# Patient Record
Sex: Female | Born: 1940 | Race: Black or African American | Hispanic: No | Smoking: Never smoker
Health system: Southern US, Community
[De-identification: ages and names within clinical notes are randomized; demographics above are authoritative.]

## PROBLEM LIST (undated history)

## (undated) DIAGNOSIS — M545 Low back pain, unspecified: Secondary | ICD-10-CM

## (undated) DIAGNOSIS — IMO0001 Reserved for inherently not codable concepts without codable children: Secondary | ICD-10-CM

## (undated) DIAGNOSIS — I10 Essential (primary) hypertension: Secondary | ICD-10-CM

## (undated) DIAGNOSIS — R519 Headache, unspecified: Secondary | ICD-10-CM

## (undated) DIAGNOSIS — I480 Paroxysmal atrial fibrillation: Secondary | ICD-10-CM

## (undated) DIAGNOSIS — M51369 Other intervertebral disc degeneration, lumbar region without mention of lumbar back pain or lower extremity pain: Secondary | ICD-10-CM

## (undated) DIAGNOSIS — F329 Major depressive disorder, single episode, unspecified: Secondary | ICD-10-CM

## (undated) DIAGNOSIS — E119 Type 2 diabetes mellitus without complications: Secondary | ICD-10-CM

## (undated) DIAGNOSIS — R200 Anesthesia of skin: Secondary | ICD-10-CM

## (undated) DIAGNOSIS — F419 Anxiety disorder, unspecified: Secondary | ICD-10-CM

## (undated) DIAGNOSIS — E785 Hyperlipidemia, unspecified: Secondary | ICD-10-CM

## (undated) DIAGNOSIS — M1990 Unspecified osteoarthritis, unspecified site: Secondary | ICD-10-CM

## (undated) DIAGNOSIS — I471 Supraventricular tachycardia, unspecified: Secondary | ICD-10-CM

## (undated) DIAGNOSIS — I4891 Unspecified atrial fibrillation: Secondary | ICD-10-CM

## (undated) DIAGNOSIS — F32A Depression, unspecified: Secondary | ICD-10-CM

## (undated) DIAGNOSIS — M5126 Other intervertebral disc displacement, lumbar region: Secondary | ICD-10-CM

## (undated) DIAGNOSIS — E079 Disorder of thyroid, unspecified: Secondary | ICD-10-CM

## (undated) DIAGNOSIS — I2510 Atherosclerotic heart disease of native coronary artery without angina pectoris: Secondary | ICD-10-CM

## (undated) DIAGNOSIS — Z531 Procedure and treatment not carried out because of patient's decision for reasons of belief and group pressure: Secondary | ICD-10-CM

## (undated) DIAGNOSIS — K219 Gastro-esophageal reflux disease without esophagitis: Secondary | ICD-10-CM

## (undated) DIAGNOSIS — M5136 Other intervertebral disc degeneration, lumbar region: Secondary | ICD-10-CM

## (undated) DIAGNOSIS — E559 Vitamin D deficiency, unspecified: Secondary | ICD-10-CM

## (undated) DIAGNOSIS — G4700 Insomnia, unspecified: Secondary | ICD-10-CM

## (undated) DIAGNOSIS — R51 Headache: Secondary | ICD-10-CM

## (undated) DIAGNOSIS — I219 Acute myocardial infarction, unspecified: Secondary | ICD-10-CM

## (undated) DIAGNOSIS — G8929 Other chronic pain: Secondary | ICD-10-CM

## (undated) HISTORY — DX: Unspecified atrial fibrillation: I48.91

## (undated) HISTORY — DX: Headache, unspecified: R51.9

## (undated) HISTORY — DX: Anesthesia of skin: R20.0

## (undated) HISTORY — PX: TUBAL LIGATION: SHX77

## (undated) HISTORY — DX: Paroxysmal atrial fibrillation: I48.0

## (undated) HISTORY — DX: Unspecified osteoarthritis, unspecified site: M19.90

## (undated) HISTORY — DX: Anxiety disorder, unspecified: F41.9

## (undated) HISTORY — DX: Essential (primary) hypertension: I10

## (undated) HISTORY — DX: Vitamin D deficiency, unspecified: E55.9

## (undated) HISTORY — DX: Gastro-esophageal reflux disease without esophagitis: K21.9

## (undated) HISTORY — DX: Acute myocardial infarction, unspecified: I21.9

## (undated) HISTORY — DX: Insomnia, unspecified: G47.00

## (undated) HISTORY — DX: Atherosclerotic heart disease of native coronary artery without angina pectoris: I25.10

## (undated) HISTORY — PX: DILATION AND CURETTAGE OF UTERUS: SHX78

## (undated) HISTORY — DX: Hyperlipidemia, unspecified: E78.5

## (undated) HISTORY — DX: Disorder of thyroid, unspecified: E07.9

## (undated) HISTORY — DX: Type 2 diabetes mellitus without complications: E11.9

## (undated) HISTORY — DX: Headache: R51

## (undated) HISTORY — PX: CATARACT EXTRACTION: SUR2

---

## 2004-11-08 ENCOUNTER — Ambulatory Visit (HOSPITAL_COMMUNITY): Admission: RE | Admit: 2004-11-08 | Discharge: 2004-11-08 | Payer: Self-pay | Admitting: Ophthalmology

## 2007-02-21 HISTORY — PX: CORONARY ANGIOPLASTY WITH STENT PLACEMENT: SHX49

## 2007-11-26 ENCOUNTER — Ambulatory Visit: Payer: Self-pay | Admitting: Vascular Surgery

## 2007-11-26 ENCOUNTER — Ambulatory Visit (HOSPITAL_COMMUNITY)
Admission: RE | Admit: 2007-11-26 | Discharge: 2007-11-26 | Payer: Self-pay | Admitting: Physical Medicine and Rehabilitation

## 2007-11-26 ENCOUNTER — Encounter (INDEPENDENT_AMBULATORY_CARE_PROVIDER_SITE_OTHER): Payer: Self-pay | Admitting: Physical Medicine and Rehabilitation

## 2007-11-26 ENCOUNTER — Encounter: Payer: Self-pay | Admitting: Cardiology

## 2008-02-19 ENCOUNTER — Ambulatory Visit: Payer: Self-pay | Admitting: Cardiology

## 2008-02-19 ENCOUNTER — Encounter: Payer: Self-pay | Admitting: Cardiology

## 2008-02-19 DIAGNOSIS — M545 Low back pain: Secondary | ICD-10-CM

## 2008-02-19 DIAGNOSIS — M549 Dorsalgia, unspecified: Secondary | ICD-10-CM | POA: Insufficient documentation

## 2008-02-19 DIAGNOSIS — R0789 Other chest pain: Secondary | ICD-10-CM | POA: Insufficient documentation

## 2008-02-19 DIAGNOSIS — R519 Headache, unspecified: Secondary | ICD-10-CM | POA: Insufficient documentation

## 2008-02-19 DIAGNOSIS — H402290 Chronic angle-closure glaucoma, unspecified eye, stage unspecified: Secondary | ICD-10-CM | POA: Insufficient documentation

## 2008-02-19 DIAGNOSIS — R51 Headache: Secondary | ICD-10-CM | POA: Insufficient documentation

## 2008-02-20 ENCOUNTER — Inpatient Hospital Stay (HOSPITAL_COMMUNITY): Admission: EM | Admit: 2008-02-20 | Discharge: 2008-02-25 | Payer: Self-pay | Admitting: Emergency Medicine

## 2008-02-20 ENCOUNTER — Ambulatory Visit: Payer: Self-pay | Admitting: Cardiology

## 2008-02-20 ENCOUNTER — Encounter: Payer: Self-pay | Admitting: Cardiology

## 2008-02-24 ENCOUNTER — Encounter: Payer: Self-pay | Admitting: Cardiology

## 2008-02-25 ENCOUNTER — Encounter: Payer: Self-pay | Admitting: Cardiology

## 2008-03-12 ENCOUNTER — Ambulatory Visit: Payer: Self-pay | Admitting: Cardiology

## 2008-04-09 ENCOUNTER — Encounter: Payer: Self-pay | Admitting: Cardiology

## 2008-09-25 ENCOUNTER — Ambulatory Visit: Payer: Self-pay | Admitting: Cardiology

## 2008-09-25 ENCOUNTER — Encounter: Payer: Self-pay | Admitting: Cardiology

## 2008-09-25 DIAGNOSIS — I2510 Atherosclerotic heart disease of native coronary artery without angina pectoris: Secondary | ICD-10-CM | POA: Insufficient documentation

## 2008-09-25 DIAGNOSIS — I25119 Atherosclerotic heart disease of native coronary artery with unspecified angina pectoris: Secondary | ICD-10-CM | POA: Insufficient documentation

## 2009-01-18 ENCOUNTER — Encounter: Payer: Self-pay | Admitting: Cardiology

## 2009-04-12 ENCOUNTER — Ambulatory Visit (HOSPITAL_COMMUNITY): Admission: RE | Admit: 2009-04-12 | Discharge: 2009-04-12 | Payer: Self-pay | Admitting: Ophthalmology

## 2009-10-05 ENCOUNTER — Telehealth (INDEPENDENT_AMBULATORY_CARE_PROVIDER_SITE_OTHER): Payer: Self-pay | Admitting: *Deleted

## 2009-10-08 ENCOUNTER — Telehealth (INDEPENDENT_AMBULATORY_CARE_PROVIDER_SITE_OTHER): Payer: Self-pay | Admitting: *Deleted

## 2009-11-12 ENCOUNTER — Ambulatory Visit: Payer: Self-pay | Admitting: Cardiology

## 2009-11-12 DIAGNOSIS — R0602 Shortness of breath: Secondary | ICD-10-CM | POA: Insufficient documentation

## 2009-11-12 DIAGNOSIS — I10 Essential (primary) hypertension: Secondary | ICD-10-CM | POA: Insufficient documentation

## 2009-11-12 DIAGNOSIS — E1169 Type 2 diabetes mellitus with other specified complication: Secondary | ICD-10-CM | POA: Insufficient documentation

## 2009-11-12 DIAGNOSIS — I152 Hypertension secondary to endocrine disorders: Secondary | ICD-10-CM | POA: Insufficient documentation

## 2009-11-12 DIAGNOSIS — E782 Mixed hyperlipidemia: Secondary | ICD-10-CM | POA: Insufficient documentation

## 2009-11-23 ENCOUNTER — Encounter: Payer: Self-pay | Admitting: Cardiology

## 2010-03-22 NOTE — Progress Notes (Signed)
Summary: hold plavix  Phone Note Other Incoming Call back at [phone]   Caller: Cindy with Dr. Tonia Brooms  Summary of Call: needs to have tooth extracted and wants to know how long to be off Plavix prior to extraction  [facility], LPN  October 05, 2009 10:11 AM   Follow-up for Phone Call        7 days, rsume ASAP and continue 81mg  by mouth once daily ASA Follow-up by: Lewayne Bunting, MD, [facility],  October 10, 2009 10:42 AM  Additional Follow-up for Phone Call Additional follow up Details #1::        Patient notified.    Additional Follow-up by: Hoover Brunette, LPN,  October 11, 2009 11:26 AM

## 2010-03-22 NOTE — Cardiovascular Report (Signed)
Summary: Cardiac Catheterization  Cardiac Catheterization   Imported By: Dorise Hiss 11/11/2009 14:22:39  _____________________________________________________________________  External Attachment:    Type:   Image     Comment:   External Document

## 2010-03-22 NOTE — Assessment & Plan Note (Signed)
Summary: 1 yr fu per aug reminder   Visit Type:  Follow-up Primary Provider:  Dr. Charm Barges  CC:  c/o SOB.  History of Present Illness: the patient is a 70 year old female with a history of prior stent placement with a Xience V mid LAD stent in January 2010. The patient has been doing well. She reports no chest pain shortness of breath orthopnea PND. She does remain on aspirin and Plavix. She denies any palpitations or syncope. Probably like her heart exam is within normal limits.  Preventive Screening-Counseling & Management  Alcohol-Tobacco     Smoking Status: never  Current Medications (verified): 1)  Triamterene-Hctz 37.5-25 Mg Tabs (Triamterene-Hctz) .... 1/2 Tab Daily 2)  Fish Oil 1000 Mg Caps (Omega-3 Fatty Acids) .... Take 1 Tablet By Mouth Two Times A Day 3)  Vitamin D 400 Unit  Tabs (Cholecalciferol) .... Take 1 Tablet By Mouth Once A Day 4)  Vitamin C 500 Mg Tabs (Ascorbic Acid) .... Take 1 Tablet By Mouth Once A Day 5)  Aspirin Ec 325 Mg Tbec (Aspirin) .... Take One Tablet By Mouth Daily 6)  Plavix 75 Mg Tabs (Clopidogrel Bisulfate) .... Take One Tablet By Mouth Daily 7)  Simvastatin 40 Mg Tabs (Simvastatin) .... Take One Tablet By Mouth Daily At Bedtime 8)  Metoprolol Tartrate 25 Mg Tabs (Metoprolol Tartrate) .... Take One Tablet By Mouth Twice A Day 9)  Ambien 10 Mg Tabs (Zolpidem Tartrate) .... As Needed 10)  Nitroglycerin 0.4 Mg Subl (Nitroglycerin) .... One Tablet Under Tongue Every 5 Minutes As Needed For Chest Pain---May Repeat Times Three 11)  Hydrocodone-Acetaminophen 5-325 Mg Tabs (Hydrocodone-Acetaminophen) .... Take One By Mouth Every Six Hours As Needed  Allergies (verified): No Known Drug Allergies  Comments:  Nurse/Medical Assistant: The patient's medication list and allergies were reviewed with the patient and were updated in the Medication and Allergy Lists.  Past History:  Past Medical History: Last updated: 02/19/2008 arthritis Glaucoma Herniated  disc followed by Dr. Abigail Miyamoto in [place]. Next Sinus problems Hypertension Dyslipidemia Borderline diabetes mellitus but not on any medications.  Family History: Last updated: 02/19/2008 mother had a small heart attack but she states 47 Father died from cancer The patient has 6 sisters 2 of them have coronary artery disease. Both however smoke. The patient has 5 brothers one died from a myocardial infarction but he also was a smoker. There is a history of prostate cancer and bleeding ulcers. The patient reports that she has a first cousin who had a brain aneurysm.  Social History: Last updated: 02/19/2008 the patient does not smoke or drink. She denies any drug use.  Risk Factors: Smoking Status: never (11/12/2009)  Review of Systems  The patient denies fatigue, malaise, fever, weight gain/loss, vision loss, decreased hearing, hoarseness, chest pain, palpitations, shortness of breath, prolonged cough, wheezing, sleep apnea, coughing up blood, abdominal pain, blood in stool, nausea, vomiting, diarrhea, heartburn, incontinence, blood in urine, muscle weakness, joint pain, leg swelling, rash, skin lesions, headache, fainting, dizziness, depression, anxiety, enlarged lymph nodes, easy bruising or bleeding, and environmental allergies.    Vital Signs:  Patient profile:   70 year old female Height:      66 inches Weight:      187 pounds BMI:     30.29 O2 Sat:      98 % on Room air Pulse rate:   76 / minute BP sitting:   122 / 78  (left arm) Cuff size:   regular  Vitals Entered By: Isabelle Course  Anderson (November 12, 2009 12:56 PM)  Nutrition Counseling: Patient's BMI is greater than 25 and therefore counseled on weight management options.  O2 Flow:  Room air CC: c/o SOB   Physical Exam  Additional Exam:  General: Well-developed, well-nourished in no distress head: Normocephalic and atraumatic eyes PERRLA/EOMI intact, conjunctiva and lids normal nose: No deformity or  lesions mouth normal dentition, normal posterior pharynx neck: Supple, no JVD.  No masses, thyromegaly or abnormal cervical nodes lungs: Normal breath sounds bilaterally without wheezing.  Normal percussion heart: regular rate and rhythm with normal S1 and S2, no S3 or S4.  PMI is normal.  No pathological murmurs abdomen: Normal bowel sounds, abdomen is soft and nontender without masses, organomegaly or hernias noted.  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 Shows no recurrent chest pain. We will continue dual antiplatelet therapy. Her updated medication list for this problem includes:    Aspirin Ec 325 Mg Tbec (Aspirin) .Marland Kitchen... Take one tablet by mouth daily    Plavix 75 Mg Tabs (Clopidogrel bisulfate) .Marland Kitchen... Take one tablet by mouth daily    Metoprolol Tartrate 25 Mg Tabs (Metoprolol tartrate) .Marland Kitchen... Take one tablet by mouth twice a day    Nitroglycerin 0.4 Mg Subl (Nitroglycerin) ..... One tablet under tongue every 5 minutes as needed for chest pain---may repeat times three  Problem # 2:  LOW BACK PAIN, CHRONIC (ICD-724.2) Assessment: Comment Only  Problem # 3:  ESSENTIAL HYPERTENSION (ICD-401.9) blood pressure controlled continue current medical regimen Her updated medication list for this problem includes:    Triamterene-hctz 37.5-25 Mg Tabs (Triamterene-hctz) .Marland Kitchen... 1/2 tab daily    Aspirin Ec 325 Mg Tbec (Aspirin) .Marland Kitchen... Take one tablet by mouth daily    Metoprolol Tartrate 25 Mg Tabs (Metoprolol tartrate) .Marland Kitchen... Take one tablet by mouth twice a day  Problem #  4:  HYPERLIPIDEMIA (ICD-272.4) followup outpatient primary care physician. Continue statin drug therapy. Her updated medication list for this problem includes:    Simvastatin 40 Mg Tabs (Simvastatin) .Marland Kitchen... Take one tablet by mouth daily at bedtime  Other Orders: EKG w/ Interpretation (93000) Pulmonary Function Test (PFT)  Patient Instructions: 1)  Scheduled PFT's  2)  Follow up in  6 months

## 2010-03-22 NOTE — Progress Notes (Signed)
Summary: Patient needing to know when to restart plavix  Phone Note Call from Patient Call back at Home Phone [phone]   Caller: Patient Reason for Call: Talk to Nurse Details for Reason: tooth pulled Summary of Call: Patient called to say that she could not wait any longer for a call back in reference to coming off plavix to have her  tooth extracted.  She took herself off so that she could have her tooth extracted.  She would like to know when she should start back on her plavix Initial call taken by: Claudette Laws,  October 08, 2009 8:51 AM  Follow-up for Phone Call        Patient stated she had dentist appointment on Monday to have tooth extracted.  Stated that she did not take her Plavix that morning because she had not eat anything and was going to take meds when she got back home.  Patient said that she waited there over an hour for an answer from [place] about her Plavix.  Informed pt that I did not receive a call from them on Monday, but I did receive a call on 8/16 from Dr. Carollee Massed office questioning how many days pt should come off Plavix.  Patient states that she continued to hold Plavix because she thought that the dentist office would have her come back this week.  Also, advised dentist office that the message had been sent to MD and when I get reply, I will call them back.  Advised pt to continue taking her Plavix for now and will call her back when get response from MD.  Patient verbalized understanding.  Follow-up by: Hoover Brunette, LPN,  October 08, 2009 9:22 AM  Additional Follow-up for Phone Call Additional follow up Details #1::        See previous phone note.  Patient notified to hold Plavix 7 days prior to extraction. Additional Follow-up by: Hoover Brunette, LPN,  October 11, 2009 11:27 AM

## 2010-03-25 ENCOUNTER — Telehealth (INDEPENDENT_AMBULATORY_CARE_PROVIDER_SITE_OTHER): Payer: Self-pay | Admitting: *Deleted

## 2010-03-30 NOTE — Progress Notes (Signed)
Summary: ?SIMVASTATIN REFILL  Phone Note Call from Patient Call back at Home Phone [phone]   Caller: Patient Call For: nurse Summary of Call: message left on nurse voicemail that patient needed simvastatin refill sent to [facility]. Nurse reviewed chart and found that MD states that PCP manages cholesterol. Nurse called and informed patient that since [facility] check lipids that she should refill cholesterol meds and she should have refill request go to PCP office. Patient verbalized understanding of plan. Initial call taken by: Carlye Grippe,  March 25, 2010 11:45 AM

## 2010-04-06 ENCOUNTER — Telehealth (INDEPENDENT_AMBULATORY_CARE_PROVIDER_SITE_OTHER): Payer: Self-pay | Admitting: *Deleted

## 2010-04-12 ENCOUNTER — Observation Stay (HOSPITAL_COMMUNITY)
Admission: EM | Admit: 2010-04-12 | Discharge: 2010-04-13 | DRG: 313 | Disposition: A | Discharge status: Home / Self Care | Payer: Medicare HMO | Attending: Cardiology | Admitting: Cardiology

## 2010-04-12 ENCOUNTER — Emergency Department (HOSPITAL_COMMUNITY): Payer: Medicare HMO

## 2010-04-12 DIAGNOSIS — I2510 Atherosclerotic heart disease of native coronary artery without angina pectoris: Secondary | ICD-10-CM | POA: Insufficient documentation

## 2010-04-12 DIAGNOSIS — IMO0002 Reserved for concepts with insufficient information to code with codable children: Secondary | ICD-10-CM | POA: Insufficient documentation

## 2010-04-12 DIAGNOSIS — E785 Hyperlipidemia, unspecified: Secondary | ICD-10-CM | POA: Insufficient documentation

## 2010-04-12 DIAGNOSIS — R0789 Other chest pain: Principal | ICD-10-CM | POA: Insufficient documentation

## 2010-04-12 DIAGNOSIS — Z9861 Coronary angioplasty status: Secondary | ICD-10-CM | POA: Insufficient documentation

## 2010-04-12 DIAGNOSIS — H409 Unspecified glaucoma: Secondary | ICD-10-CM | POA: Insufficient documentation

## 2010-04-12 DIAGNOSIS — R079 Chest pain, unspecified: Secondary | ICD-10-CM

## 2010-04-12 DIAGNOSIS — E119 Type 2 diabetes mellitus without complications: Secondary | ICD-10-CM | POA: Insufficient documentation

## 2010-04-12 DIAGNOSIS — I10 Essential (primary) hypertension: Secondary | ICD-10-CM | POA: Insufficient documentation

## 2010-04-12 LAB — CBC
HCT: 38 % (ref 36.0–46.0)
HCT: 37.9 % (ref 36.0–46.0)
Hemoglobin: 13 g/dL (ref 12.0–15.0)
Hemoglobin: 13 g/dL (ref 12.0–15.0)
MCH: 29.8 pg (ref 26.0–34.0)
MCHC: 34.2 g/dL (ref 30.0–36.0)
MCHC: 34.3 g/dL (ref 30.0–36.0)
MCV: 87 fL (ref 78.0–100.0)
MCV: 86.9 fL (ref 78.0–100.0)
WBC: 6.2 10*3/uL (ref 4.0–10.5)

## 2010-04-12 LAB — URINALYSIS, ROUTINE W REFLEX MICROSCOPIC
Bilirubin Urine: NEGATIVE
Hgb urine dipstick: NEGATIVE
Ketones, ur: NEGATIVE mg/dL
Urine Glucose, Fasting: NEGATIVE mg/dL
pH: 7 (ref 5.0–8.0)

## 2010-04-12 LAB — COMPREHENSIVE METABOLIC PANEL
ALT: 22 U/L (ref 0–35)
Alkaline Phosphatase: 79 U/L (ref 39–117)
BUN: 6 mg/dL (ref 6–23)
CO2: 28 mEq/L (ref 19–32)
GFR calc non Af Amer: >60 mL/min (ref >60)
Glucose, Bld: 207 mg/dL — ABNORMAL HIGH (ref 70–99)
Potassium: 3.5 mEq/L (ref 3.5–5.1)
Sodium: 140 mEq/L (ref 135–145)

## 2010-04-12 LAB — CK TOTAL AND CKMB (NOT AT ARMC)
CK, MB: 5.1 ng/mL — ABNORMAL HIGH (ref 0.3–4.0)
Relative Index: 1.4 (ref 0.0–2.5)
Total CK: 369 U/L — ABNORMAL HIGH (ref 7–177)

## 2010-04-12 LAB — DIFFERENTIAL
Basophils Absolute: 0 10*3/uL (ref 0.0–0.1)
Eosinophils Relative: 5 % (ref 0–5)
Lymphocytes Relative: 34 % (ref 12–46)
Lymphs Abs: 2.1 10*3/uL (ref 0.7–4.0)
Monocytes Absolute: 0.3 10*3/uL (ref 0.1–1.0)
Neutro Abs: 3.5 10*3/uL (ref 1.7–7.7)

## 2010-04-12 LAB — TROPONIN I: Troponin I: 0.02 ng/mL (ref 0.00–0.06)

## 2010-04-12 LAB — GLUCOSE, CAPILLARY: Glucose-Capillary: 163 mg/dL — ABNORMAL HIGH (ref 70–99)

## 2010-04-12 LAB — POCT CARDIAC MARKERS
Myoglobin, poc: 121 ng/mL (ref 12–200)
Myoglobin, poc: 88 ng/mL (ref 12–200)

## 2010-04-13 ENCOUNTER — Inpatient Hospital Stay (HOSPITAL_COMMUNITY): Payer: Medicare HMO

## 2010-04-13 LAB — BASIC METABOLIC PANEL
BUN: 9 mg/dL (ref 6–23)
Chloride: 103 mEq/L (ref 96–112)
Creatinine, Ser: 0.95 mg/dL (ref 0.4–1.2)

## 2010-04-13 LAB — CARDIAC PANEL(CRET KIN+CKTOT+MB+TROPI)
CK, MB: 2.8 ng/mL (ref 0.3–4.0)
Total CK: 228 U/L — ABNORMAL HIGH (ref 7–177)
Troponin I: 0.01 ng/mL (ref 0.00–0.06)

## 2010-04-13 LAB — GLUCOSE, CAPILLARY
Glucose-Capillary: 126 mg/dL — ABNORMAL HIGH (ref 70–99)
Glucose-Capillary: 121 mg/dL — ABNORMAL HIGH (ref 70–99)

## 2010-04-13 LAB — URINE CULTURE

## 2010-04-13 MED ORDER — TECHNETIUM TC 99M TETROFOSMIN IV KIT
10.0000 | PACK | Freq: Once | INTRAVENOUS | Status: AC | PRN
  Administered 2010-04-13: 10 via INTRAVENOUS

## 2010-04-13 MED ORDER — TECHNETIUM TC 99M TETROFOSMIN IV KIT
30.0000 | PACK | Freq: Once | INTRAVENOUS | Status: AC | PRN
  Administered 2010-04-13: 12:00:00 30 via INTRAVENOUS

## 2010-04-19 NOTE — Progress Notes (Signed)
Summary: PHONE: HAVING CP  Phone Note Call from Patient   Caller: SELF Complaint: Chest Pain Summary of Call: Having "weird" feelings in her rt. chest area. Started on Sunday 2-12 continued having some chest pains so she took a Nitro and then she ended up taking 2 more pills on Sunday night.   [phone]  Home #  Initial call taken by: Zachary George,  April 06, 2010 8:24 AM  Follow-up for Phone Call        Pain in right ribcage on Sunday.  Was moving some furniture earlier so thought it may have been that.   Did take 3 NTG for that Suday.  Pain was relieved.  States pain is still there a little on back/ribcage area.  7/10.  Taking Ibuprofen or Tylenol for this now.  Not taken any NTG today.   Advised pt. to call PMD for eval first.  Did schedule her for her routine 6 month f/u for 4/12 at 11:00.  Advised her to have PMD (Dr. Karie Kirks) call to request earlier if she feels need to be seen by [place] urgently.  Also advised pt to call 911 / go to ED for evaluation if occurs again.  Patient verbalized understanding.  Follow-up by: Hoover Brunette, LPN,  April 06, 2010 12:37 PM  Additional Follow-up for Phone Call Additional follow up Details #1::        Agree with referral to emergency room if no improvement.

## 2010-04-21 NOTE — H&P (Signed)
NAMEBEYONCE, SAWATZKY                ACCOUNT NO.:  [id number]  MEDICAL RECORD NO.:  [id number]           PATIENT TYPE:  I  LOCATION:  6524                         FACILITY:  [facility]  PHYSICIAN:  Learta Codding, MD,FACC DATE OF BIRTH:  December 08, 1940  DATE OF ADMISSION:  04/12/2010 DATE OF DISCHARGE:                             HISTORY & PHYSICAL   The patient is a pleasant 70 year old female with a history of prior stent placement with a Xience stent to the mid LAD in January 2010.  The patient was seen by me in the clinic some time ago.  She has a history of hypertension, diabetes mellitus, which is borderline.  Her last catheterization was in January 2010, and this was done by Dr. Juanda Chance. The patient was found to have a 95% stenosis in the proximal LAD with irregularities of the circumflex as well as the mid right coronary artery.  She underwent successful stent placement.  She really has done well since that time.  She now presents with right shoulder pain, which moved into her chest. She said that on Sunday, she was moving stuff around and started developing right shoulder pain.  She woke up that night at around 1 o'clock in the morning while turning around in bed and had to lie on the other side because her right shoulder was hurting.  She did not have any associated shortness of breath.  She also reports no pain during activity in the last few days.  She states that the shoulder pain has started before Sunday, but was not severe.  She was under stress because her sister died last week.  She states that the shoulder pain typically lasts 15-20 minutes.  Sunday night, she took two nitroglycerin, which did seem to help, and was better after 30 minutes, however, this has not been a consistent future.  Today, the patient has remained active and states that her pain did not worsen.  There was some radiation of this pain into the chest and into the neck area, but not into the arms.  She is  concerned about her symptoms and came to the emergency room.  She lives in [place] and her sisters drove her down.  EKG on admission did show some nonspecific changes with T-wave inversions and downsloping ST depression in inferior leads and some flattened T-waves in the lateral leads.  The patient currently is complaining of right shoulder pain, but no chest pain.  Point-of-care enzymes were negative.  MEDICATIONS: 1. Aspirin 375 mg. 2. Plavix actually was discontinued in the beginning of the year in     January. 3. Lopressor 25 mg twice a day. 4. Nitroglycerin. 5. Simvastatin 40 a day. 6. Triamterene and hydrochlorothiazide. 7. Vitamin D. 8. Vitamin C. 9. Ambien. 10.Hydrocodone.  SOCIAL HISTORY:  The patient lives in [place].  She does not smoke. She denies any alcohol use.  FAMILY HISTORY:  Notable for mother dying from MI, father from cancer. She has siblings with coronary artery disease.  REVIEW OF SYSTEMS:  The patient denies any fever, chills, or sweats. She has no headaches.  She has no rash or  lesions.  She has no dyspnea on exertion, orthopnea, or PND.  There is no melena or hematochezia. There is no weakness or numbness.  She does have diffuse joint pains and has arthralgias for which she takes hydrocodone.  She denies any nausea or vomiting.  She has no polyuria or polydipsia.  Remainder of 18-point review of systems is negative.  PHYSICAL EXAMINATION:  VITAL SIGNS:  Blood pressure 160/90, heart rate is 73 beats per minute, respirations are 14, temperature is 98.1, saturation is 100%. GENERAL:  A well-nourished African American female, in no apparent distress. HEENT:  NCAT.  PERRLA.  Sclerae icteric. NECK:  Supple.  Normal carotid upstrokes and no carotid bruits.  No JVD, no lymphadenopathy. HEART:  Regular rate and rhythm with normal S1 and S2.  No pathological murmurs. LUNGS:  Clear breath sounds bilaterally without wheezes. SKIN:  No rash or  lesions. ABDOMEN:  Soft and nontender with no rebound or guarding and good bowel sounds. GU:  Deferred. RECTAL:  Deferred. EXTREMITIES:  No cyanosis, clubbing, or edema.  No rash or lesions. MUSCULOSKELETAL:  The patient has some tenderness at the right shoulder joint and some decreased range of motion. NEURO:  The patient is alert, oriented, and grossly nonfocal.  X-ray, no acute changes.  EKG as described above.  LABORATORY DATA:  White count 6.2, hemoglobin 13, hematocrit 38, platelet count 138.  Creatinine is 0.87 with a BUN of 6, potassium is 3.5, sodium is 140.  PROBLEM LIST: 1. Right shoulder pain with atypical substernal chest pain. 2. History of coronary artery disease with drug-eluting stent to the     proximal left anterior descending, Plavix discontinued earlier this     year. 3. Cardiac risk factors including hypertension, diabetes mellitus     (borderline), and dyslipidemia. 4. Family history of coronary artery disease.  PLAN: 1. The patient's symptoms are rather atypical.  She has absolutely no     exertional symptoms.  Her pain is really in the right shoulder.  It     is reproducible with palpation of the right shoulder, and there is     decreased range of motion.  The patient currently reports no     substernal chest pain.  There are some nonspecific EKG changes.     Point-of-care markers, however, are negative.  At this point, I     suspect that the patient's chest pain is noncardiac, but certainly     there is some concern with her EKG changes and also the fact that     her Plavix was discontinued earlier this year, although she took     Plavix for a total duration of 2 years. 2. I discussed with the patient at length the plan.  We will keep her     overnight.  We will obtain serial enzyme markers.  If her troponin     markers are positive, certainly catheterization is indicated.  If     the troponin markers are negative, then we will proceed with a      Myoview study.  I offered the patient to have set up as an     outpatient, but she wanted to have the tests done as an inpatient.     In the meanwhile, we will continue her medications.  She can be, in     the meanwhile, restarted on Plavix.  We will give her a small dose     of 150 mg of Plavix and restart her on 75  mg day.  We will also     place some nitroglycerin paste as it did appear that on Sunday two     nitroglycerin seems to have helped her pain somewhat.  I do not     think we need to place her on heparin at this point in time.  We     will also continue her pain medications for arthritis including     hydrocodone, which she has not taken any today.     Learta Codding, MD,FACC     GED/MEDQ  D:  04/12/2010  T:  04/13/2010  Job:  [id number]  Electronically Signed by Lewayne Bunting MDFACC on 04/21/2010 10:28:23 AM

## 2010-04-25 ENCOUNTER — Telehealth (INDEPENDENT_AMBULATORY_CARE_PROVIDER_SITE_OTHER): Payer: Self-pay | Admitting: *Deleted

## 2010-04-28 NOTE — Discharge Summary (Addendum)
[name], [name] NO.:  [id number]  MEDICAL RECORD NO.:  [id number]           PATIENT TYPE:  I  LOCATION:  6524                         FACILITY:  [facility]  PHYSICIAN:  Luis Abed, MD, FACCDATE OF BIRTH:  May 12, 1940  DATE OF ADMISSION:  04/12/2010 DATE OF DISCHARGE:  04/13/2010                              DISCHARGE SUMMARY   PRIMARY CARDIOLOGIST:  Learta Codding, MD,FACC.  PRIMARY CARE PROVIDER:  Samuel Jester in [place].  DISCHARGE DIAGNOSIS:  Chest pain without objective evidence of ischemia.  SECONDARY DIAGNOSES: 1. Coronary artery disease status post prior left anterior descending     artery drug-eluting stent placement. 2. Hypertension. 3. Hyperlipidemia. 4. Borderline diabetes mellitus. 5. History of chronic sinusitis. 6. Glaucoma. 7. Herniated disk.  ALLERGIES:  No known drug allergies.  PROCEDURES: 1. Chest x-ray on April 13, 2010, showing no acute disease, clear     lungs. 2. April 18, 2010, exercise Myoview:  Exercise x6 minutes, maximal     heart rate 146 beats per minute.  Hypertensive response to     exercise. 3. Imaging showed an EF of 81% without evidence of ischemia or     infarct.  HISTORY OF PRESENT ILLNESS:  A 70 year old [place] American female with prior history of coronary artery status post prior LAD stenting in January 2010.  The patient noted recently that she was experiencing a right shoulder pain and scapular discomfort, though somewhat nagging in nature and initially unrelieved with sublingual nitroglycerin.  Due to persistent symptoms, this patient was seen in an urgent care on April 12, 2010.  There, she had an EKG showing no acute changes.  A chest x- ray was also performed and there was question of free air under the right hemidiaphragm.  The patient was sent to the ED for further evaluation.  In the [facility] ED, point-of-care markers were negative.  An ECG showed inferior T-wave inversion with  downsloping ST-segment depression in V5 and V6.  This had resolved on followup ECG.  The patient was admitted for further evaluation.  HOSPITAL COURSE:  The patient continued to have intermittent right shoulder pain, not necessarily worse with movement or palpation. Cardiac enzymes have remained negative.  Followup ECG this morning shows no acute changes.  She underwent exercise Myoview showing reasonable exercise tolerance, hypertensive response to exercise.  Perfusion imaging showed no evidence of ischemia or infarct and normal LV function.  The patient will be discharged home today in good condition.  DISCHARGE LABORATORY DATA:  Hemoglobin 13.0, hematocrit 37.9, WBC 7.1, platelets 193.  Sodium 143, potassium 4.0, chloride 103, CO2 of 30, BUN 9, creatinine 0.95, glucose 126.  Total bilirubin 0.5, alkaline phosphatase 79, AST 29, ALT 22, total protein 6.6, albumin 4.0, calcium 9.5.  CK 228, MB 2.8, troponin I 0.01.  Urinalysis negative.  Urine culture showed no growth.  DISPOSITION:  The patient will be discharged home today in good condition.  FOLLOWUP PLANS AND APPOINTMENTS:  The patient will follow up with Dr. Andee Lineman as previously scheduled in early April and also follow up with Dr. Samuel Jester within the next 1-2 weeks.  DISCHARGE MEDICATIONS: 1. Metoprolol 25 mg b.i.d. 2. Simvastatin 40 mg nightly. 3. Aspirin 81 mg daily. 4. Triamterene/hydrochlorothiazide 37.5/25 mg half a tablet daily. 5. Nitroglycerin 0.4 mg sublingual p.r.n. and chest pain.  OUTSTANDING LABS AND STUDIES:  None.  DURATION OF DISCHARGE ENCOUNTER:  Sixty minutes including physician time.     Nicolasa Ducking, ANP   ______________________________ Luis Abed, MD, [facility]    CB/MEDQ  D:  04/13/2010  T:  04/14/2010  Job:  [id number]  cc:   Samuel Jester  Electronically Signed by Nicolasa Ducking ANP on 04/26/2010 04:09:20 PM Electronically Signed by Willa Rough MD FACC on 04/27/2010  10:41:33 AM Electronically Signed by Willa Rough MD FACC on 04/27/2010 10:41:57 AM Electronically Signed by Willa Rough MD FACC on 04/27/2010 10:42:58 AM Electronically Signed by Willa Rough MD FACC on 04/27/2010 10:43:59 AM Electronically Signed by Willa Rough MD FACC on 04/27/2010 10:44:58 AM Electronically Signed by Willa Rough MD FACC on 04/27/2010 10:45:58 AM Electronically Signed by Willa Rough MD FACC on 04/27/2010 10:47:02 AM Electronically Signed by Willa Rough MD FACC on 04/27/2010 10:47:59 AM Electronically Signed by Willa Rough MD FACC on 04/27/2010 10:48:59 AM Electronically Signed by Willa Rough MD FACC on 04/27/2010 10:49:59 AM Electronically Signed by Willa Rough MD FACC on 04/27/2010 10:50:59 AM Electronically Signed by Willa Rough MD FACC on 04/27/2010 10:51:59 AM Electronically Signed by Willa Rough MD FACC on 04/27/2010 10:53:00 AM Electronically Signed by Willa Rough MD FACC on 04/27/2010 10:53:59 AM Electronically Signed by Willa Rough MD FACC on 04/27/2010 10:54:59 AM Electronically Signed by Willa Rough MD FACC on 04/27/2010 10:56:00 AM Electronically Signed by Willa Rough MD FACC on 04/27/2010 10:57:01 AM Electronically Signed by Willa Rough MD FACC on 04/27/2010 10:57:59 AM Electronically Signed by Willa Rough MD FACC on 04/27/2010 10:58:59 AM Electronically Signed by Willa Rough MD FACC on 04/27/2010 11:00:00 AM Electronically Signed by Willa Rough MD FACC on 04/27/2010 11:01:02 AM Electronically Signed by Willa Rough MD FACC on 04/27/2010 11:01:59 AM Electronically Signed by Willa Rough MD FACC on 04/27/2010 11:02:59 AM Electronically Signed by Willa Rough MD FACC on 04/27/2010 11:03:59 AM Electronically Signed by Willa Rough MD FACC on 04/27/2010 11:04:59 AM Electronically Signed by Willa Rough MD FACC on 04/27/2010 11:05:59 AM Electronically Signed by Willa Rough MD FACC on 04/27/2010 11:06:59 AM Electronically Signed by Willa Rough  MD FACC on 04/27/2010 11:07:59 AM

## 2010-05-03 NOTE — Progress Notes (Signed)
Summary: how long need to be on Plavix  Phone Note Call from Patient   Caller: voicemail message Summary of Call: Questions how long she is to be on Plavix.  Looks like she is on indefinite.  Please advise. Initial call taken by: Hoover Brunette, LPN,  April 25, 2010 5:40 PM  Follow-up for Phone Call        she only has one stent and this was 2 years ago. Can d/c Plavix, but continue ASA.  Follow-up by: Lewayne Bunting, MD, [facility],  April 29, 2010 12:30 PM  Additional Follow-up for Phone Call Additional follow up Details #1::        Patient notified.   Has already scheduled OV for April.  Patient verbalized understanding.  Additional Follow-up by: Hoover Brunette, LPN,  April 29, 2010 5:03 PM

## 2010-05-06 ENCOUNTER — Encounter: Payer: Self-pay | Admitting: *Deleted

## 2010-06-02 ENCOUNTER — Ambulatory Visit: Payer: Self-pay | Admitting: Cardiology

## 2010-06-06 LAB — CBC
HCT: 33.2 % — ABNORMAL LOW (ref 36.0–46.0)
HCT: 34 % — ABNORMAL LOW (ref 36.0–46.0)
HCT: 40.3 % (ref 36.0–46.0)
Hemoglobin: 11.4 g/dL — ABNORMAL LOW (ref 12.0–15.0)
Hemoglobin: 11.6 g/dL — ABNORMAL LOW (ref 12.0–15.0)
Hemoglobin: 11.9 g/dL — ABNORMAL LOW (ref 12.0–15.0)
Hemoglobin: 12.1 g/dL (ref 12.0–15.0)
MCHC: 33.2 g/dL (ref 30.0–36.0)
MCHC: 33.3 g/dL (ref 30.0–36.0)
MCHC: 33.4 g/dL (ref 30.0–36.0)
MCHC: 34 g/dL (ref 30.0–36.0)
MCHC: 34.3 g/dL (ref 30.0–36.0)
MCV: 89.1 fL (ref 78.0–100.0)
MCV: 89.2 fL (ref 78.0–100.0)
Platelets: 174 10*3/uL (ref 150–400)
Platelets: 177 10*3/uL (ref 150–400)
RBC: 3.74 MIL/uL — ABNORMAL LOW (ref 3.87–5.11)
RBC: 3.87 MIL/uL (ref 3.87–5.11)
RBC: 4.52 MIL/uL (ref 3.87–5.11)
RDW: 12.1 % (ref 11.5–15.5)
RDW: 12.1 % (ref 11.5–15.5)
RDW: 12.3 % (ref 11.5–15.5)
RDW: 12.4 % (ref 11.5–15.5)
RDW: 12.4 % (ref 11.5–15.5)
WBC: 5.9 10*3/uL (ref 4.0–10.5)

## 2010-06-06 LAB — BASIC METABOLIC PANEL
CO2: 25 mEq/L (ref 19–32)
CO2: 29 mEq/L (ref 19–32)
Calcium: 8.9 mg/dL (ref 8.4–10.5)
Chloride: 104 mEq/L (ref 96–112)
Creatinine, Ser: 0.81 mg/dL (ref 0.4–1.2)
GFR calc Af Amer: >60 mL/min (ref >60)
GFR calc Af Amer: >60 mL/min (ref >60)
Glucose, Bld: 118 mg/dL — ABNORMAL HIGH (ref 70–99)
Potassium: 3.8 mEq/L (ref 3.5–5.1)
Sodium: 136 mEq/L (ref 135–145)

## 2010-06-06 LAB — HEMOGLOBIN A1C
Hgb A1c MFr Bld: 6.1 % (ref 4.6–6.1)
Mean Plasma Glucose: 128 mg/dL

## 2010-06-06 LAB — GLUCOSE, CAPILLARY
Glucose-Capillary: 104 mg/dL — ABNORMAL HIGH (ref 70–99)
Glucose-Capillary: 112 mg/dL — ABNORMAL HIGH (ref 70–99)
Glucose-Capillary: 117 mg/dL — ABNORMAL HIGH (ref 70–99)
Glucose-Capillary: 128 mg/dL — ABNORMAL HIGH (ref 70–99)

## 2010-06-06 LAB — HEPARIN LEVEL (UNFRACTIONATED)
Heparin Unfractionated: 0.55 IU/mL (ref 0.30–0.70)
Heparin Unfractionated: 0.59 IU/mL (ref 0.30–0.70)
Heparin Unfractionated: 0.62 IU/mL (ref 0.30–0.70)

## 2010-06-06 LAB — CARDIAC PANEL(CRET KIN+CKTOT+MB+TROPI)
CK, MB: 1.9 ng/mL (ref 0.3–4.0)
Total CK: 124 U/L (ref 7–177)
Troponin I: 0.02 ng/mL (ref 0.00–0.06)

## 2010-06-13 ENCOUNTER — Inpatient Hospital Stay (INDEPENDENT_AMBULATORY_CARE_PROVIDER_SITE_OTHER)
Admission: RE | Admit: 2010-06-13 | Discharge: 2010-06-13 | Disposition: A | Discharge status: Home / Self Care | Payer: Medicare HMO | Source: Ambulatory Visit

## 2010-06-13 DIAGNOSIS — R071 Chest pain on breathing: Secondary | ICD-10-CM

## 2010-06-13 LAB — POCT I-STAT, CHEM 8
Calcium, Ion: 1.18 mmol/L (ref 1.12–1.32)
HCT: 38 % (ref 36.0–46.0)
Sodium: 140 mEq/L (ref 135–145)
TCO2: 25 mmol/L (ref 0–100)

## 2010-07-05 NOTE — Discharge Summary (Signed)
NAMEPUNEET, [name]                ACCOUNT NO.:  [id number]   MEDICAL RECORD NO.:  [id number]          PATIENT TYPE:  INP   LOCATION:  6527                         FACILITY:  [facility]   PHYSICIAN:  Bruce R. Juanda Chance, MD, FACCDATE OF BIRTH:  March 10, 1940   DATE OF ADMISSION:  02/20/2008  DATE OF DISCHARGE:  02/25/2008                               DISCHARGE SUMMARY   PRIMARY/FINAL DISCHARGE DIAGNOSIS:  Acute coronary syndrome with  percutaneous transluminal coronary angioplasty and stent this admission.   SECONDARY DIAGNOSES:  1. Hypertension.  2. Hyperlipidemia with a total cholesterol of 212, triglycerides 149,      HDL 40, LDL 142, simvastatin added.  3. Borderline diabetes with a nonfasting blood sugar of 131 and      fasting blood sugar of 118 and hemoglobin A1c of 6.2.  4. Obesity.  5. Arthritis.  6. Glaucoma.  7. 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  8. Chronic sinus congestion.  9. Family history of coronary artery disease in 3 siblings and her      mother.  10.Preserved left ventricular function with an ejection fraction of      70% at catheterization.   TIME AT DISCHARGE:  32 minutes.   HOSPITAL COURSE:  [name] is a 70 year old female with no previous  history of coronary artery disease.  She was evaluated by her primary  care physician for chest pain and had a stress nuclear study that was  markedly positive.  She was admitted on February 20, 2008, for further  evaluation and catheterization.   [name] cardiac enzymes were negative for MI.  A heart  catheterization was performed on February 24, 2008, and showed a 95%  proximal LAD stenosis, which was treated with PTCA and a drug-eluting  stent reducing the stenosis to 0.  There was no significant disease in  the circumflex system, and in the RCA, there was a 30% proximal and mid  narrowing.  Her EF was 70% with no wall motion abnormalities.   On February 25, 2008, [name] was having no chest pain  or shortness of  breath.  She was mildly anemic with a hemoglobin of 11.4.  Her  hemoglobin was 13.9 on admission.  This is felt secondary to blood draws  and a heart catheterization and can be followed as an outpatient.  Her  blood sugars are described above.  She is encouraged to follow up with  her primary care physician and stick to a diabetic diet.  Lipid-lowering  medication was added as well as aspirin and Plavix.  Her heart rate was  in the 70s and 80s, and she was started on metoprolol 25 mg b.i.d.  Because of this metoprolol, her diuretics was cut back to a half dose,  but this can be adjusted further as an outpatient as needed.  She had a  headache that was a low level and she stated it started after she was  given nitroglycerin.  She has a history of headaches but denies a  diagnosis of migraines.  She was given p.r.n. pain control medications  for  the headache and is to follow up with primary care.  She was  considered stable for discharge on February 25, 2008.   DISCHARGE INSTRUCTIONS:  1. Her activity level is to be increased gradually with no driving for      3 days and no lifting for 2 weeks.  2. She is to stick to a low-fat, diabetic diet.  3. She is to call our office for problems with the cath site.  4. She is to follow up with Dr. Andee Lineman in [place] on March 12, 2008, at      2:00 p.m. and with Dr. Charm Barges as needed.   DISCHARGE MEDICATIONS:  1. Hydrocodone as prior to admission.  2. Triamterene/hydrochlorothiazide 37.5/25 one-half tablet daily.  3. Ambien 10 mg as prior to admission.  4. Gabapentin 100 mg nightly as prior to admission.  5. Aspirin 325 mg daily.  6. Plavix 75 mg daily.  7. Nitroglycerin sublingual p.r.n.  8. Simvastatin 40 mg daily.  9. Metoprolol 25 mg b.i.d.      Theodore Demark, PA-C      Bruce R. Juanda Chance, MD, [facility]  Electronically Signed    RB/MEDQ  D:  02/25/2008  T:  02/26/2008  Job:  [id number]   cc:   Samuel Jester  [facility] in  [place], [place]

## 2010-07-05 NOTE — Assessment & Plan Note (Signed)
[facility]                          [facility] NOTE   NAME:[name], [name]                       MRN:          [id number]  DATE:03/12/2008                            DOB:          01-Oct-1940    REFERRING PHYSICIAN:  Dr. Samuel Jester.   HISTORY OF PRESENT ILLNESS:  The patient is a 70 year old female with  history of hypertension and chest pain.  She also has reported headache  when she visiting the clinic last time, she reported the worst headache  in her life and we sent her for an MRI/MRA.  They did not show any  significant abnormalities, although there was some question that the  left vertebral artery may fill in a retrograde fashion with proximal  occlusion.  However, otherwise, there was no abnormalities and there was  no intracranial aneurysm.  The patient was referred for catheterization  and was found to have a high-grade LAD lesion.  She underwent stenting.  Her headache has much improved in the meanwhile and she appears to have  occasional cardiac cephalalgia.  She states that she feels much better.  She has a slight tension headache frontal, but much improved and not  radiating to back of her head anymore and of her shoulders.  She denies  any orthopnea, PND, palpitations, or syncope.  The remainder of the  review of systems is essentially negative.   MEDICATIONS:  1. Triamterene and hydrochlorothiazide 37.5/25 mg p.o. daily.  2. Vitamin C.  3. Fish oil.  4. Vitamin D.  5. Gabapentin.  6. Aspirin.  7. Plavix 75 mg a day.  8. Simvastatin 40 mg at bedtime.  9. Metoprolol 25 mg p.o. b.i.d.   PHYSICAL EXAMINATION:  VITAL SIGNS:  Blood pressure 110/78, heart rate  58, and weight 186 pounds.  NECK:  Normal carotid upstroke.  No carotid bruits.  LUNGS:  Clear breath sounds bilaterally.  HEART:  Regular rate and rhythm.  Normal S1 and S2.  No murmur, rubs, or  gallops.  ABDOMEN:  Soft.  EXTREMITY:  No cyanosis, clubbing,  or edema.  NEURO:  The patient is alert, oriented, and grossly nonfocal.   PROBLEM LIST:  1. Cardiac cephalalgia .  2. Proximal LAD disease status post stenting with a Xience drug-      eluting stent.  3. Hypertension, controlled.  4. Dyslipidemia.   PLAN:  1. The patient is doing much better.  She still has some headaches.      This can be addressed by Dr. Charm Barges.  I told her to take some      Tylenol because she had not taken really anything in the interim.  2. The patient's cardiac symptoms has vastly improved.  Continue on      the medical regimen.  We will follow up her in 6 months.     Learta Codding, MD,FACC  Electronically Signed    GED/MedQ  DD: 03/12/2008  DT: 03/13/2008  Job #: [id number]   cc:   Samuel Jester

## 2010-07-05 NOTE — Cardiovascular Report (Signed)
[name], POPPEN                ACCOUNT NO.:  [id number]   MEDICAL RECORD NO.:  [id number]          PATIENT TYPE:  INP   LOCATION:  6527                         FACILITY:  [facility]   PHYSICIAN:  Bruce R. Juanda Chance, MD, FACCDATE OF BIRTH:  [birth year]   DATE OF PROCEDURE:  02/24/2008  DATE OF DISCHARGE:                            CARDIAC CATHETERIZATION   CLINICAL HISTORY:  Ms. Broers is 70 years old and has a long history of  hypertension and recent history of chest pain.  She recently had a  nuclear study, which was markedly positive and she was admitted over the  weekend with a diagnosis of unstable angina and scheduled for  catheterization today.  She has an interesting finding of having  headaches that predictably precede her anginal symptoms.   PROCEDURE:  The procedure was performed via the right femoral artery,  arterial sheath, and 5-French preformed coronary catheters.  A front  wall arterial puncture was performed and Omnipaque contrast was used.  At completion of the diagnostic study, we made a decision to proceed  with intervention on the lesion in the LAD.   The patient was given Angiomax bolus and infusion.  She had been  previously given 4 chewable aspirin and previously had been started on  Plavix and was given additional 300 mg of Plavix in the laboratory.  We  used a Q3.5 6-French guiding catheter with side holes.  We passed a  Prowater wire down the vessel across the lesion in the proximal LAD  without difficulty.  We pre-dilated the lesion with a 2.5 x 12 mm Apex  balloon performing 1 inflation up to 8 atmospheres for 30 seconds.  We  then positioned a 2.75 x 12 mm Xience stent and deployed this with 1  inflation of 10 atmospheres for 30 seconds.  The stent was post-dilated  with a 3.0 x 8 mm Soldier Voyager performing 1 inflation up to 16 atmospheres  for 30 seconds.  Final diagnostics were then performed through the  guiding catheter.  The patient tolerated the  procedure well and left the  laboratory in satisfactory condition.   RESULTS:  The left main coronary artery was free of significant disease.   The left anterior descending artery gave rise to 2 small septal  perforators, then had a 95% proximal stenosis located just at a small  diagonal branch and a third small septal perforator.  This vessel gave  rise to another moderate-sized septal perforator and moderate-sized  diagonal branch.   The circumflex artery gave rise to a very large ramus branch and AV  branch was terminated in the small posterolateral branch.  These vessels  were irregular, but there was no significant obstruction.   The right coronary artery was a small-to-moderate-sized vessel, gave  rise to 2 ventricle branches, a small posterior descending and 2 small  posterolateral branches.  There was 30% proximal and 30% mid narrowing  in the right coronary artery.   The left ventriculogram performed in the RAO projection showed good wall  motion with no areas of hypokinesis.  The estimated ejection fraction  was  70%.   Following stenting of the lesion, the proximal LAD stenosis improved  from 95% to 0%.   CONCLUSION:  1. Coronary artery disease with 95% stenosis in the proximal left      anterior descending, irregularities in the circumflex artery, 30%      narrowing in the proximal and 30% narrowing in the mid right      coronary artery, and normal left ventricular function.  2. Successful percutaneous coronary intervention of the lesion in the      proximal left anterior descending using a Xience drug-eluting stent      with improvement in central narrowing from 95% to 0%.   DISPOSITION:  The patient returned to the [facility] room for further  observation.  We were unable to close the right femoral artery due to a  stick below the bifurcation.      Bruce Elvera Lennox Juanda Chance, MD, [facility]  Electronically Signed     BRB/MEDQ  D:  02/24/2008  T:  02/24/2008  Job:  [id number]

## 2010-07-05 NOTE — H&P (Signed)
NAMELANELL, CARPENTER NO.:  [id number]   MEDICAL RECORD NO.:  [id number]          PATIENT TYPE:  INP   LOCATION:  3705                         FACILITY:  [facility]   PHYSICIAN:  Gerrit Friends. Dietrich Pates, MD, FACCDATE OF BIRTH:  02/23/1940   DATE OF ADMISSION:  02/20/2008  DATE OF DISCHARGE:                              HISTORY & PHYSICAL   REFERRING PHYSICIAN:  Dr. Samuel Jester.   PRIMARY CARDIOLOGIST:  Learta Codding, MD, FACC   HISTORY OF PRESENT ILLNESS:  A 70 year old woman with a history of  hypertension that has been well controlled with medical therapy, seen in  the office in evening 2 days ago with atypical chest discomfort and now  found to have a markedly positive stress nuclear study prompting  admission with planned cardiac catheterization.   Ms. Newmann has no prior cardiac history.  She has never been seen by a  cardiologist in the past.  She presents to her primary care physician  with a few-week history of episodic headache and chest discomfort.  She  developed sharp pain and pressure behind her eyes with blurry vision and  subsequently notes anterior chest pressure associated with dyspnea, but  no diaphoresis, no nausea.  There is associated discomfort at the neck  and shoulders.  Episodes typically last 5 minutes.  Ms. Milkovich was  provided with prescription for nitroglycerin, but has not yet used that  medication.  There is no relationship to exertion.  She has some  associated palpitations.  Her most recent episode occurred just a few  hours ago.   Ms. Buckman also has a history of hyperlipidemia, but is not currently  receiving pharmacologic therapy.  She reports fairly significant dyspnea  on exertion with the capacity only to walk 100 feet on flat ground.  She  has had no lightheadedness and no syncope.   Past medical history is otherwise notable for arthritis, glaucoma,  herniated nucleus pulposus in her lumbosacral spine followed by Dr.  Abigail Miyamoto,  chronic sinus congestion, and borderline diabetes treated with  diet therapy.   Her current medications include only Dyazide.  She also takes vitamin C,  fish oil, and vitamin D.   She has no known medical allergies.   FAMILY HISTORY:  Mother suffered a small MI, but lived to age 81; father  died due to neoplastic disease.  Of 11 siblings, 3 have coronary disease  from previous myocardial infarction.   SOCIAL HISTORY:  No use of tobacco products nor alcohol.   REVIEW OF SYSTEMS:  Chronic low back pain; all other systems reviewed  and are negative.   PHYSICAL EXAMINATION:  GENERAL:  Pleasant woman in no acute distress.  VITAL SIGNS:  The temperature is 97, respirations 12, heart rate 80 and  regular, blood pressure 155/66, O2 saturation 100% on room air, weight  190 pounds.  HEENT:  Anicteric sclerae; injected conjunctivae; normal oral mucosa.  NECK:  No jugular venous distention; normal carotid upstrokes without  bruits.  ENDOCRINE:  No thyromegaly.  HEMATOPOIETIC:  No adenopathy.  THORAX:  No chest wall deformities.  LUNGS:  Clear to auscultation; resonant to percussion.  CARDIAC:  Normal first and second heart sounds; normal PMI; no gallop,  no murmur.  ABDOMEN:  Soft and nontender; normal bowel sounds; no  masses; no organomegaly.  EXTREMITIES:  No edema; normal distal pulses.  NEUROLOGIC:  Symmetric strength and tone; normal cranial nerves.  SKIN:  No significant lesions.   Chest x-ray, EKG, and labs are pending.   IMPRESSION:  Ms. Dail presents with very atypical chest discomfort  that is unrelated to exertion, but a positive stress nuclear study.  If  her symptoms do in fact reflect myocardial ischemia, she is having  symptoms at rest, and thus has an unstable pattern.  Accordingly, she  will be treated with intravenous nitroglycerin, heparin, aspirin,  clopidogrel, beta-blocker, and a statin.  A lipid profile will be  obtained.  If symptoms do not recur, we will plan  to proceed with  cardiac catheterization on Monday.   Ms. Curnutt is experiencing severe headaches.  If these continue to occur  in the hospital, a neurologic consultation may be helpful.  She was  referred for an MRI/MRA of the head, neck, but that study has apparently  not yet been obtained.   Other issues are stable including DJD of the lumbosacral spine, history  of glaucoma, and hypertension.      Gerrit Friends. Dietrich Pates, MD, [facility]  Electronically Signed     RMR/MEDQ  D:  02/20/2008  T:  02/21/2008  Job:  [id number]

## 2010-07-19 ENCOUNTER — Ambulatory Visit (INDEPENDENT_AMBULATORY_CARE_PROVIDER_SITE_OTHER): Payer: Medicare HMO | Admitting: Cardiology

## 2010-07-19 ENCOUNTER — Encounter: Payer: Self-pay | Admitting: Cardiology

## 2010-07-19 VITALS — BP 122/72 | HR 71 | Ht 65.0 in | Wt 186.0 lb

## 2010-07-19 DIAGNOSIS — I259 Chronic ischemic heart disease, unspecified: Secondary | ICD-10-CM

## 2010-07-19 DIAGNOSIS — E785 Hyperlipidemia, unspecified: Secondary | ICD-10-CM

## 2010-07-19 DIAGNOSIS — R0789 Other chest pain: Secondary | ICD-10-CM

## 2010-07-19 MED ORDER — RANITIDINE HCL 150 MG PO TABS: 150.0000 mg | ORAL_TABLET | Freq: Two times a day (BID) | ORAL | Status: DC

## 2010-07-19 NOTE — Progress Notes (Signed)
HPI The patient is a 70 year old female who was admitted several months ago to [facility] with chest pain without objective evidence of ischemia. She has a history of coronary artery disease and is status post prior drug-eluting stent placement to the LAD. Cardiac risk factors include hypertension hyperlipidemia and borderline diabetes mellitus. The patient had an exercise Myoview done during this hospitalization there was a hypertensive blood pressure response but no evidence of ischemia the myocardial perfusion and ejection fraction of 81%. Stent placement to the LAD was in January of 2010.  The patient has been back in the emergency room since February. She was seen about a month ago for recurrent chest pain. However she reports to me that her symptoms of chest pain predominantly occur in the middle of the night. Particularly when laying down. The symptoms are actually relieved by taking TUMS. There is no component of exertional chest pain or shortness of breath. She has no orthopnea or PND.  Not on File  Current Outpatient Prescriptions on File Prior to Visit  Medication Sig Dispense Refill  . Ascorbic Acid (VITAMIN C) 500 MG tablet Take 500 mg by mouth daily.        Marland Kitchen aspirin 325 MG tablet Take 325 mg by mouth daily.        . Cholecalciferol (VITAMIN D) 400 UNITS capsule Take 400 Units by mouth daily.        Marland Kitchen HYDROcodone-acetaminophen (NORCO) 5-325 MG per tablet Take 1 tablet by mouth every 6 (six) hours as needed.        . metoprolol tartrate (LOPRESSOR) 25 MG tablet Take 25 mg by mouth 2 (two) times daily.        . nitroGLYCERIN (NITROSTAT) 0.4 MG SL tablet Place 0.4 mg under the tongue every 5 (five) minutes as needed.        . Omega-3 Fatty Acids (FISH OIL) 1000 MG CAPS Take 1 capsule by mouth 2 (two) times daily.        . simvastatin (ZOCOR) 40 MG tablet Take 40 mg by mouth at bedtime.        . triamterene-hydrochlorothiazide (DYAZIDE) 37.5-25 MG per capsule Take 0.5 capsules by  mouth every morning.       . zolpidem (AMBIEN) 10 MG tablet Take 10 mg by mouth at bedtime as needed.          Past Medical History  Diagnosis Date  . Arthritis   . Glaucoma   . Herniated disc     followed by Dr.Barco in [place]   . Sinus complaint   . Hypertension   . Dyslipidemia   . Diabetes mellitus     borderline    No past surgical history on file.  Family History  Problem Relation Age of Onset  . Heart attack Mother   . Cancer Father     History   Social History  . Marital Status: Widowed    Spouse Name: N/A    Number of Children: N/A  . Years of Education: N/A   Occupational History  . RETIRED    Social History Main Topics  . Smoking status: Never Smoker   . Smokeless tobacco: Never Used  . Alcohol Use: No  . Drug Use: No  . Sexually Active: Not on file   Other Topics Concern  . Not on file   Social History Narrative  . No narrative on file   HQI:ONGEXBMWU positives as outlined above. The remainder of the 18  point review of  systems is negative   PHYSICAL EXAM BP 122/72  Pulse 71  Ht 5\' 5"  (1.651 m)  Wt 186 lb (84.369 kg)  BMI 30.95 kg/m2  General: Well-developed, well-nourished in no distress Head: Normocephalic and atraumatic Eyes:PERRLA/EOMI intact, conjunctiva and lids normal Ears: No deformity or lesions Mouth:normal dentition, normal posterior pharynx Neck: Supple, no JVD.  No masses, thyromegaly or abnormal cervical nodes Lungs: Normal breath sounds bilaterally without wheezing.  Normal percussion Cardiac: regular rate and rhythm with normal S1 and S2, no S3 or S4.  PMI is normal.  No pathological murmurs Abdomen: Normal bowel sounds, abdomen is soft and nontender without masses, organomegaly or hernias noted.  No hepatosplenomegaly MSK: Back normal, normal gait muscle strength and tone normal Vascular: Pulse is normal in all 4 extremities Extremities: No peripheral pitting edema Neurologic: Alert and oriented x 3 Skin: Intact  without lesions or rashes Lymphatics: No significant adenopathy Psychologic: Normal affect  ECG:NA  ASSESSMENT AND PLAN

## 2010-07-19 NOTE — Assessment & Plan Note (Signed)
The patient's symptoms are consistent with esophageal reflux disease as well as esophageal spasm. I recommended that she take Zantac 150 mils by mouth each bedtime and when necessary antiacids. I did ask the patient to followup with her primary care physician to consider possible endoscopy and further evaluation.

## 2010-07-19 NOTE — Assessment & Plan Note (Signed)
Coronary artery disease status post LAD drug-eluting stent placement 2010  Status post substernal chest pain: Negative Myoview February 2012. No evidence of ischemia.

## 2010-07-19 NOTE — Patient Instructions (Signed)
Your physician wants you to follow-up in: 6 months. You will receive a reminder letter in the mail one-two months in advance. If you don't receive a letter, please call our office to schedule the follow-up appointment. Take Zantac (ranitidine) 150mg  every night. You can get this over-the-counter.

## 2010-07-19 NOTE — Assessment & Plan Note (Signed)
Followed by primary care physician

## 2010-11-25 LAB — COMPREHENSIVE METABOLIC PANEL
AST: 18 U/L (ref 0–37)
Albumin: 4.3 g/dL (ref 3.5–5.2)
Calcium: 9.7 mg/dL (ref 8.4–10.5)
Creatinine, Ser: 0.75 mg/dL (ref 0.4–1.2)
GFR calc Af Amer: >60 mL/min (ref >60)
GFR calc non Af Amer: >60 mL/min (ref >60)
Sodium: 136 mEq/L (ref 135–145)
Total Protein: 7 g/dL (ref 6.0–8.3)

## 2010-11-25 LAB — DIFFERENTIAL
Eosinophils Relative: 5 % (ref 0–5)
Lymphocytes Relative: 29 % (ref 12–46)
Lymphs Abs: 1.8 10*3/uL (ref 0.7–4.0)
Monocytes Absolute: 0.4 10*3/uL (ref 0.1–1.0)
Monocytes Relative: 7 % (ref 3–12)

## 2010-11-25 LAB — CBC
Platelets: 199 10*3/uL (ref 150–400)
RBC: 4.7 MIL/uL (ref 3.87–5.11)
WBC: 6.3 10*3/uL (ref 4.0–10.5)

## 2010-11-25 LAB — LIPID PANEL
HDL: 40 mg/dL (ref >39)
LDL Cholesterol: 142 mg/dL — ABNORMAL HIGH (ref 0–99)
Triglycerides: 149 mg/dL (ref <150)
VLDL: 30 mg/dL (ref 0–40)

## 2010-11-25 LAB — CK TOTAL AND CKMB (NOT AT ARMC)
CK, MB: 2.9 ng/mL (ref 0.3–4.0)
Relative Index: 1.5 (ref 0.0–2.5)
Total CK: 191 U/L — ABNORMAL HIGH (ref 7–177)

## 2010-11-25 LAB — HEMOGLOBIN A1C: Hgb A1c MFr Bld: 6.2 % — ABNORMAL HIGH (ref 4.6–6.1)

## 2010-11-25 LAB — TROPONIN I: Troponin I: 0.01 ng/mL (ref 0.00–0.06)

## 2010-12-09 ENCOUNTER — Other Ambulatory Visit: Payer: Self-pay | Admitting: Cardiology

## 2011-01-31 ENCOUNTER — Ambulatory Visit (INDEPENDENT_AMBULATORY_CARE_PROVIDER_SITE_OTHER): Payer: Medicare HMO | Admitting: Cardiology

## 2011-01-31 ENCOUNTER — Encounter: Payer: Self-pay | Admitting: Cardiology

## 2011-01-31 VITALS — BP 123/71 | HR 75 | Ht 65.0 in | Wt 183.0 lb

## 2011-01-31 DIAGNOSIS — I259 Chronic ischemic heart disease, unspecified: Secondary | ICD-10-CM

## 2011-01-31 DIAGNOSIS — I2510 Atherosclerotic heart disease of native coronary artery without angina pectoris: Secondary | ICD-10-CM

## 2011-01-31 DIAGNOSIS — I10 Essential (primary) hypertension: Secondary | ICD-10-CM

## 2011-01-31 DIAGNOSIS — E785 Hyperlipidemia, unspecified: Secondary | ICD-10-CM

## 2011-01-31 NOTE — Assessment & Plan Note (Signed)
No recurrent chest pain. Continue medical therapy. No ischemia workup needed

## 2011-01-31 NOTE — Assessment & Plan Note (Signed)
Blood pressure well controlled. No further changes in her medical regimen.

## 2011-01-31 NOTE — Progress Notes (Signed)
Peyton Bottoms, MD, [facility] ABIM Board Certified in Adult Cardiovascular Medicine,Internal Medicine and Critical Care Medicine    CC: Routine followup patient with coronary artery disease  HPI:  The patient is a 70 year old female with a history of coronary disease status post prior drug-eluting stent placement to the LAD in 2010. The patient has multiple cardiac risk factors including hypertension, hyperlipidemia and borderline diabetes mellitus. From a cardiac standpoint is doing well. She denies any chest pain, shortness of breath orthopnea PND she also has no palpitations. During the last office visit she complained of Atypical chest pain which appeared to be related to reflux. This has been resolved.     PMH: reviewed and listed in Problem List in Electronic Records (and see below)  Past Medical History  Diagnosis Date  . Arthritis   . Glaucoma   . Herniated disc     followed by Dr.Barco in [place]   . Sinus complaint   . Hypertension   . Dyslipidemia   . Diabetes mellitus     borderline  . Coronary artery disease     Status post drug-eluting stent placement to the LAD January 2010. Status post exercise Myoview 2012 no ischemia    No past surgical history on file.   Allergies/SH/FHX : available in Electronic Records for review  Medications: Current Outpatient Prescriptions  Medication Sig Dispense Refill  . Ascorbic Acid (VITAMIN C) 500 MG tablet Take 500 mg by mouth daily.        Marland Kitchen aspirin 325 MG tablet Take 325 mg by mouth daily.        . Cholecalciferol (VITAMIN D) 400 UNITS capsule Take 400 Units by mouth daily.        . Garlic 100 MG TABS Take 1 tablet by mouth daily.        Marland Kitchen HYDROcodone-acetaminophen (NORCO) 5-325 MG per tablet Take 1 tablet by mouth every 6 (six) hours as needed.        . metoprolol tartrate (LOPRESSOR) 25 MG tablet Take 1 tablet (25 mg total) by mouth 2 (two) times daily.  90 tablet  3  . nitroGLYCERIN (NITROSTAT) 0.4 MG SL tablet Place  0.4 mg under the tongue every 5 (five) minutes as needed.        . Omega-3 Fatty Acids (FISH OIL) 1000 MG CAPS Take 1 capsule by mouth 2 (two) times daily.        . ranitidine (ZANTAC) 150 MG tablet Take 1 tablet (150 mg total) by mouth 2 (two) times daily.  60 tablet  11  . simvastatin (ZOCOR) 40 MG tablet Take 40 mg by mouth at bedtime.        . triamterene-hydrochlorothiazide (DYAZIDE) 37.5-25 MG per capsule Take 0.5 capsules by mouth every morning.       . vitamin B-12 (CYANOCOBALAMIN) 100 MCG tablet Take 50 mcg by mouth daily.        Marland Kitchen zolpidem (AMBIEN) 10 MG tablet Take 10 mg by mouth at bedtime as needed.          ROS: No nausea or vomiting. No fever or chills.No melena or hematochezia.No bleeding.No claudication  Physical Exam: BP 123/71  Pulse 75  Ht 5\' 5"  (1.651 m)  Wt 183 lb (83.008 kg)  BMI 30.45 kg/m2  SpO2 97% General: Well-nourished African American female in no distress Neck: Normal carotid upstroke no carotid bruits. No thyromegaly nonnodular thyroid. Lungs: Clear breath sounds bilaterally. No wheezing Cardiac: Regular rate and rhythm with normal S1-S2 with  no murmur rubs or gallops Vascular: No edema. Normal distal pulses Skin: Warm and dry  12lead ECG: Not obtained Limited bedside ECHO:N/A   Assessment and Plan

## 2011-01-31 NOTE — Patient Instructions (Signed)
Continue all current medications. Your physician wants you to follow up in:  1 year.  You will receive a reminder letter in the mail one-two months in advance.  If you don't receive a letter, please call our office to schedule the follow up appointment   

## 2011-01-31 NOTE — Assessment & Plan Note (Signed)
Followed by the patient's primary care physician 

## 2011-06-30 ENCOUNTER — Other Ambulatory Visit: Payer: Self-pay | Admitting: Cardiology

## 2012-02-11 ENCOUNTER — Encounter: Payer: Self-pay | Admitting: Cardiology

## 2012-02-12 ENCOUNTER — Encounter: Payer: Self-pay | Admitting: Cardiology

## 2012-02-12 ENCOUNTER — Ambulatory Visit (INDEPENDENT_AMBULATORY_CARE_PROVIDER_SITE_OTHER): Payer: Medicare HMO | Admitting: Cardiology

## 2012-02-12 VITALS — BP 147/74 | HR 67 | Ht 65.5 in | Wt 182.1 lb

## 2012-02-12 DIAGNOSIS — I10 Essential (primary) hypertension: Secondary | ICD-10-CM

## 2012-02-12 DIAGNOSIS — E785 Hyperlipidemia, unspecified: Secondary | ICD-10-CM

## 2012-02-12 DIAGNOSIS — I2510 Atherosclerotic heart disease of native coronary artery without angina pectoris: Secondary | ICD-10-CM

## 2012-02-12 NOTE — Assessment & Plan Note (Signed)
Symptomatically stable on medical therapy, history of DES to the LAD in 2010 with negative Myoview last year. For now would continue observation. Also discussed a regular walking regimen. If she develops any accelerating symptoms, we will see her back sooner, otherwise annual visit will be arranged.

## 2012-02-12 NOTE — Assessment & Plan Note (Signed)
Keep followup with Dr. Charm Barges. On statin therapy.: LDL close to 70 if possible.

## 2012-02-12 NOTE — Patient Instructions (Addendum)

## 2012-02-12 NOTE — Assessment & Plan Note (Signed)
Blood pressure elevated today. Keep followup with Dr. Charm Barges. Might consider adding an ACE inhibitor to her Dyazide.

## 2012-02-12 NOTE — Progress Notes (Signed)
Clinical Summary [name] is a 71 y.o.female presenting for followup. She is a former patient of Dr. Andee Lineman, prefers to stay with . She was seen in December of last year. She describes some exertional symptoms that could be anginal, only when she "pushes herself or rushes." Otherwise no rest symptoms. She reports compliance with her medications. Has not required any nitroglycerin.  Exercise Myoview in February of 2012 demonstrated LVEF 81% without ischemia. Hypertensive response noted.  She has lipids followed by Dr. Charm Barges.  No regular exercise regimen at this point, we discussed this. She does work part time Education officer, [employer].  No Known Allergies  Current Outpatient Prescriptions  Medication Sig Dispense Refill  . Ascorbic Acid (VITAMIN C) 500 MG tablet Take 500 mg by mouth daily.        Marland Kitchen aspirin 325 MG tablet Take 325 mg by mouth daily.        . Cholecalciferol (VITAMIN D) 400 UNITS capsule Take 400 Units by mouth daily.        . Garlic 100 MG TABS Take 1 tablet by mouth daily.        Marland Kitchen HYDROcodone-acetaminophen (NORCO) 5-325 MG per tablet Take 1 tablet by mouth every 6 (six) hours as needed.        . meloxicam (MOBIC) 7.5 MG tablet Take 7.5 mg by mouth daily.      . metoprolol tartrate (LOPRESSOR) 25 MG tablet TAKE 1 TABLET TWICE DAILY  180 tablet  3  . nitroGLYCERIN (NITROSTAT) 0.4 MG SL tablet Place 0.4 mg under the tongue every 5 (five) minutes as needed.        . Omega-3 Fatty Acids (FISH OIL) 1000 MG CAPS Take 1 capsule by mouth 2 (two) times daily.        . simvastatin (ZOCOR) 40 MG tablet Take 40 mg by mouth at bedtime.        . triamterene-hydrochlorothiazide (DYAZIDE) 37.5-25 MG per capsule Take 0.5 capsules by mouth every morning.       . vitamin B-12 (CYANOCOBALAMIN) 100 MCG tablet Take 50 mcg by mouth daily.        Marland Kitchen zolpidem (AMBIEN) 10 MG tablet Take 10 mg by mouth at bedtime as needed.        . ranitidine (ZANTAC) 150 MG tablet Take 1 tablet (150 mg total) by  mouth 2 (two) times daily.  60 tablet  11    Past Medical History  Diagnosis Date  . Arthritis   . Glaucoma(365)   . Herniated disc   . Essential hypertension, benign   . Dyslipidemia   . Type 2 diabetes mellitus     Borderline  . Coronary atherosclerosis of native coronary artery     DES LAD 1/10    Social History [name] reports that she has never smoked. She has never used smokeless tobacco. [name] reports that she does not drink alcohol.  Review of Systems No palpitations. Chronic problems with arthritic pain. Stable appetite. No bleeding episodes. Otherwise negative.  Physical Examination Filed Vitals:   02/12/12 1445  BP: 147/74  Pulse: 67   Filed Weights   02/12/12 1445  Weight: 182 lb 1.9 oz (82.609 kg)   No acute distress. HEENT: Conjunctiva and lids normal, oropharynx clear. Neck: Supple, no elevated JVP or carotid bruits, no thyromegaly. Lungs: Clear to auscultation, nonlabored breathing at rest. Cardiac: Regular rate and rhythm, no S3 or significant systolic murmur, no pericardial rub. Abdomen: Soft, nontender, bowel sounds present. Extremities: No  pitting edema, distal pulses 2+. Skin: Warm and dry. Musculoskeletal: No kyphosis. Neuropsychiatric: Alert and oriented x3, affect grossly appropriate.   Problem List and Plan   Coronary atherosclerosis of native coronary artery Symptomatically stable on medical therapy, history of DES to the LAD in 2010 with negative Myoview last year. For now would continue observation. Also discussed a regular walking regimen. If she develops any accelerating symptoms, we will see her back sooner, otherwise annual visit will be arranged.  HYPERLIPIDEMIA Keep followup with Dr. Charm Barges. On statin therapy.: LDL close to 70 if possible.  ESSENTIAL HYPERTENSION Blood pressure elevated today. Keep followup with Dr. Charm Barges. Might consider adding an ACE inhibitor to her Dyazide.    Jonelle Sidle, M.D.,  F.A.C.C.

## 2012-04-18 ENCOUNTER — Other Ambulatory Visit: Payer: Self-pay | Admitting: Cardiology

## 2012-04-18 MED ORDER — METOPROLOL TARTRATE 25 MG PO TABS: 25.0000 mg | ORAL_TABLET | Freq: Two times a day (BID) | ORAL | Status: DC

## 2012-10-17 ENCOUNTER — Encounter: Payer: Self-pay | Admitting: Cardiology

## 2012-11-01 ENCOUNTER — Observation Stay (HOSPITAL_COMMUNITY)
Admission: EM | Admit: 2012-11-01 | Discharge: 2012-11-02 | Disposition: A | Discharge status: Home / Self Care | Payer: Medicare Other | Attending: Internal Medicine | Admitting: Internal Medicine

## 2012-11-01 ENCOUNTER — Encounter (HOSPITAL_COMMUNITY): Payer: Self-pay

## 2012-11-01 ENCOUNTER — Emergency Department (HOSPITAL_COMMUNITY): Payer: Medicare Other

## 2012-11-01 ENCOUNTER — Observation Stay (HOSPITAL_COMMUNITY): Payer: Medicare Other

## 2012-11-01 DIAGNOSIS — I25119 Atherosclerotic heart disease of native coronary artery with unspecified angina pectoris: Secondary | ICD-10-CM | POA: Diagnosis present

## 2012-11-01 DIAGNOSIS — IMO0002 Reserved for concepts with insufficient information to code with codable children: Secondary | ICD-10-CM | POA: Insufficient documentation

## 2012-11-01 DIAGNOSIS — H409 Unspecified glaucoma: Secondary | ICD-10-CM | POA: Insufficient documentation

## 2012-11-01 DIAGNOSIS — I10 Essential (primary) hypertension: Secondary | ICD-10-CM

## 2012-11-01 DIAGNOSIS — Z9861 Coronary angioplasty status: Secondary | ICD-10-CM | POA: Insufficient documentation

## 2012-11-01 DIAGNOSIS — E785 Hyperlipidemia, unspecified: Secondary | ICD-10-CM

## 2012-11-01 DIAGNOSIS — E782 Mixed hyperlipidemia: Secondary | ICD-10-CM | POA: Diagnosis present

## 2012-11-01 DIAGNOSIS — I152 Hypertension secondary to endocrine disorders: Secondary | ICD-10-CM | POA: Diagnosis present

## 2012-11-01 DIAGNOSIS — E1159 Type 2 diabetes mellitus with other circulatory complications: Secondary | ICD-10-CM | POA: Diagnosis present

## 2012-11-01 DIAGNOSIS — I2510 Atherosclerotic heart disease of native coronary artery without angina pectoris: Secondary | ICD-10-CM

## 2012-11-01 DIAGNOSIS — R7309 Other abnormal glucose: Secondary | ICD-10-CM | POA: Insufficient documentation

## 2012-11-01 DIAGNOSIS — E1169 Type 2 diabetes mellitus with other specified complication: Secondary | ICD-10-CM | POA: Diagnosis present

## 2012-11-01 DIAGNOSIS — M129 Arthropathy, unspecified: Secondary | ICD-10-CM | POA: Insufficient documentation

## 2012-11-01 DIAGNOSIS — R079 Chest pain, unspecified: Principal | ICD-10-CM

## 2012-11-01 LAB — BASIC METABOLIC PANEL
Chloride: 101 mEq/L (ref 96–112)
Creatinine, Ser: 0.67 mg/dL (ref 0.50–1.10)
Potassium: 3.6 mEq/L (ref 3.5–5.1)

## 2012-11-01 LAB — CBC WITH DIFFERENTIAL/PLATELET
Basophils Relative: 0 % (ref 0–1)
HCT: 37.7 % (ref 36.0–46.0)
Hemoglobin: 13 g/dL (ref 12.0–15.0)
Lymphocytes Relative: 33 % (ref 12–46)
MCHC: 34.5 g/dL (ref 30.0–36.0)
Monocytes Absolute: 0.3 10*3/uL (ref 0.1–1.0)
Monocytes Relative: 6 % (ref 3–12)
Neutro Abs: 3 10*3/uL (ref 1.7–7.7)

## 2012-11-01 LAB — HEPATIC FUNCTION PANEL
ALT: 21 U/L (ref 0–35)
AST: 23 U/L (ref 0–37)
Albumin: 4 g/dL (ref 3.5–5.2)
Alkaline Phosphatase: 89 U/L (ref 39–117)
Bilirubin, Direct: <0.1 mg/dL (ref 0.0–0.3)
Total Bilirubin: 0.2 mg/dL — ABNORMAL LOW (ref 0.3–1.2)

## 2012-11-01 LAB — TROPONIN I: Troponin I: <0.3 ng/mL (ref <0.30)

## 2012-11-01 MED ORDER — ASPIRIN 81 MG PO CHEW
324.0000 mg | CHEWABLE_TABLET | Freq: Once | ORAL | Status: AC
  Administered 2012-11-01: 324 mg via ORAL
  Filled 2012-11-01: qty 4

## 2012-11-01 MED ORDER — SODIUM CHLORIDE 0.9 % IJ SOLN: 3.0000 mL | Freq: Two times a day (BID) | INTRAMUSCULAR | Status: DC

## 2012-11-01 MED ORDER — ZOLPIDEM TARTRATE 5 MG PO TABS
5.0000 mg | ORAL_TABLET | Freq: Every evening | ORAL | Status: DC | PRN
  Administered 2012-11-01: 5 mg via ORAL
  Filled 2012-11-01: qty 1

## 2012-11-01 MED ORDER — SODIUM CHLORIDE 0.9 % IV SOLN: 250.0000 mL | INTRAVENOUS | Status: DC | PRN

## 2012-11-01 MED ORDER — SIMVASTATIN 20 MG PO TABS
40.0000 mg | ORAL_TABLET | Freq: Every day | ORAL | Status: DC
  Administered 2012-11-01: 40 mg via ORAL
  Filled 2012-11-01: qty 2

## 2012-11-01 MED ORDER — ENOXAPARIN SODIUM 40 MG/0.4ML ~~LOC~~ SOLN
40.0000 mg | SUBCUTANEOUS | Status: DC
  Administered 2012-11-01: 40 mg via SUBCUTANEOUS
  Filled 2012-11-01: qty 0.4

## 2012-11-01 MED ORDER — IOHEXOL 350 MG/ML SOLN
100.0000 mL | Freq: Once | INTRAVENOUS | Status: AC | PRN
  Administered 2012-11-01: 100 mL via INTRAVENOUS

## 2012-11-01 MED ORDER — TRIAMTERENE-HCTZ 37.5-25 MG PO CAPS
1.0000 | ORAL_CAPSULE | Freq: Every day | ORAL | Status: DC
  Filled 2012-11-01 (×3): qty 1

## 2012-11-01 MED ORDER — MORPHINE SULFATE 4 MG/ML IJ SOLN: 4.0000 mg | INTRAMUSCULAR | Status: DC | PRN

## 2012-11-01 MED ORDER — MORPHINE SULFATE 2 MG/ML IJ SOLN: 1.0000 mg | INTRAMUSCULAR | Status: DC | PRN

## 2012-11-01 MED ORDER — ACETAMINOPHEN 325 MG PO TABS: 650.0000 mg | ORAL_TABLET | Freq: Four times a day (QID) | ORAL | Status: DC | PRN

## 2012-11-01 MED ORDER — PANTOPRAZOLE SODIUM 40 MG PO TBEC
40.0000 mg | DELAYED_RELEASE_TABLET | Freq: Every day | ORAL | Status: DC
  Administered 2012-11-02: 40 mg via ORAL
  Filled 2012-11-01 (×2): qty 1

## 2012-11-01 MED ORDER — ONDANSETRON HCL 4 MG/2ML IJ SOLN: 4.0000 mg | Freq: Four times a day (QID) | INTRAMUSCULAR | Status: DC | PRN

## 2012-11-01 MED ORDER — LATANOPROST 0.005 % OP SOLN
1.0000 [drp] | Freq: Every day | OPHTHALMIC | Status: DC
  Administered 2012-11-01: 1 [drp] via OPHTHALMIC
  Filled 2012-11-01: qty 2.5

## 2012-11-01 MED ORDER — METOPROLOL TARTRATE 25 MG PO TABS
25.0000 mg | ORAL_TABLET | Freq: Two times a day (BID) | ORAL | Status: DC
Start: 2012-11-01 — End: 2012-11-02
  Administered 2012-11-01 – 2012-11-02 (×2): 25 mg via ORAL
  Filled 2012-11-01 (×2): qty 1

## 2012-11-01 MED ORDER — LATANOPROST 0.005 % OP SOLN
OPHTHALMIC | Status: AC
  Filled 2012-11-01: qty 2.5

## 2012-11-01 MED ORDER — ONDANSETRON HCL 4 MG PO TABS: 4.0000 mg | ORAL_TABLET | Freq: Four times a day (QID) | ORAL | Status: DC | PRN

## 2012-11-01 MED ORDER — ASPIRIN 325 MG PO TABS
325.0000 mg | ORAL_TABLET | Freq: Every day | ORAL | Status: DC
  Administered 2012-11-02: 325 mg via ORAL
  Filled 2012-11-01: qty 1

## 2012-11-01 MED ORDER — NITROGLYCERIN 0.4 MG SL SUBL: 0.4000 mg | SUBLINGUAL_TABLET | SUBLINGUAL | Status: DC | PRN

## 2012-11-01 MED ORDER — SODIUM CHLORIDE 0.9 % IJ SOLN
3.0000 mL | Freq: Two times a day (BID) | INTRAMUSCULAR | Status: DC
  Administered 2012-11-01: 3 mL via INTRAVENOUS

## 2012-11-01 MED ORDER — SODIUM CHLORIDE 0.9 % IJ SOLN
3.0000 mL | INTRAMUSCULAR | Status: DC | PRN
  Administered 2012-11-02: 3 mL via INTRAVENOUS

## 2012-11-01 MED ORDER — ACETAMINOPHEN 650 MG RE SUPP: 650.0000 mg | Freq: Four times a day (QID) | RECTAL | Status: DC | PRN

## 2012-11-01 NOTE — ED Provider Notes (Signed)
CSN: [id number]     Arrival date & time 11/01/12  1635 History   First MD Initiated Contact with Patient 11/01/12 1649     Chief Complaint  Patient presents with  . Chest Pain    HPI Pt was seen at 1720. Per pt, c/o gradual onset and persistence of multiple intermittent episodes of chest "pain" for the past 2 days.  Pt describes the CP as "dull," "aching" and "tightness." Has been associated with SOB, diaphoresis and nausea. Pt states her symptoms worsen when she exerts herself, also when she lays down at night. Symptoms do not occur with movement of her arms or body positions. Denies injury, no palpitations, no cough, no back pain, no abd pain, no N/V/D.     Cards: [place] Dr. Diona Browner Past Medical History  Diagnosis Date  . Arthritis   . Glaucoma   . Herniated disc   . Essential hypertension, benign   . Dyslipidemia   . Type 2 diabetes mellitus     Borderline  . Coronary atherosclerosis of native coronary artery     DES LAD 1/10   Past Surgical History  Procedure Laterality Date  . Coronary angioplasty with stent placement     Family History  Problem Relation Age of Onset  . Heart attack Mother   . Cancer Father    History  Substance Use Topics  . Smoking status: Never Smoker   . Smokeless tobacco: Never Used  . Alcohol Use: No    Review of Systems ROS: Statement: All systems negative except as marked or noted in the HPI; Constitutional: Negative for fever and chills. ; ; Eyes: Negative for eye pain, redness and discharge. ; ; ENMT: Negative for ear pain, hoarseness, nasal congestion, sinus pressure and sore throat. ; ; Cardiovascular: +CP, diaphoresis, SOB. Negative for palpitations, and peripheral edema. ; ; Respiratory: Negative for cough, wheezing and stridor. ; ; Gastrointestinal: +nausea. Negative for vomiting, diarrhea, abdominal pain, blood in stool, hematemesis, jaundice and rectal bleeding. . ; ; Genitourinary: Negative for dysuria, flank pain and hematuria. ; ;  Musculoskeletal: Negative for back pain and neck pain. Negative for swelling and trauma.; ; Skin: Negative for pruritus, rash, abrasions, blisters, bruising and skin lesion.; ; Neuro: Negative for headache, lightheadedness and neck stiffness. Negative for weakness, altered level of consciousness , altered mental status, extremity weakness, paresthesias, involuntary movement, seizure and syncope.       Allergies  Review of patient's allergies indicates no known allergies.  Home Medications   Current Outpatient Rx  Name  Route  Sig  Dispense  Refill  . Ascorbic Acid (VITAMIN C) 500 MG tablet   Oral   Take 500 mg by mouth daily.           Marland Kitchen aspirin 325 MG tablet   Oral   Take 325 mg by mouth daily.           . Cholecalciferol (VITAMIN D) 400 UNITS capsule   Oral   Take 400 Units by mouth daily.           . Garlic 100 MG TABS   Oral   Take 1 tablet by mouth daily.           Marland Kitchen HYDROcodone-acetaminophen (NORCO) 5-325 MG per tablet   Oral   Take 1 tablet by mouth every 6 (six) hours as needed.           . lidocaine (LIDODERM) 5 %   Transdermal   Place 1 patch onto  the skin daily.          Marland Kitchen LUMIGAN 0.01 % SOLN      1 drop at bedtime.          . meloxicam (MOBIC) 7.5 MG tablet   Oral   Take 7.5 mg by mouth daily.         . metoprolol tartrate (LOPRESSOR) 25 MG tablet   Oral   Take 1 tablet (25 mg total) by mouth 2 (two) times daily.   180 tablet   3   . nitroGLYCERIN (NITROSTAT) 0.4 MG SL tablet   Sublingual   Place 0.4 mg under the tongue every 5 (five) minutes as needed.           . Omega-3 Fatty Acids (FISH OIL) 1000 MG CAPS   Oral   Take 1 capsule by mouth 2 (two) times daily.           Marland Kitchen EXPIRED: ranitidine (ZANTAC) 150 MG tablet   Oral   Take 1 tablet (150 mg total) by mouth 2 (two) times daily.   60 tablet   11   . simvastatin (ZOCOR) 40 MG tablet   Oral   Take 40 mg by mouth at bedtime.           .  triamterene-hydrochlorothiazide (DYAZIDE) 37.5-25 MG per capsule   Oral   Take 0.5 capsules by mouth every morning.          . vitamin B-12 (CYANOCOBALAMIN) 100 MCG tablet   Oral   Take 50 mcg by mouth daily.           Marland Kitchen zolpidem (AMBIEN) 10 MG tablet   Oral   Take 10 mg by mouth at bedtime as needed.            BP 136/65  Pulse 61  Temp(Src) 98.6 F (37 C) (Oral)  Resp 12  Ht 5\' 5"  (1.651 m)  Wt 185 lb (83.915 kg)  BMI 30.79 kg/m2  SpO2 96% Physical Exam 1725: Physical examination:  Nursing notes reviewed; Vital signs and O2 SAT reviewed;  Constitutional: Well developed, Well nourished, Well hydrated, In no acute distress; Head:  Normocephalic, atraumatic; Eyes: EOMI, PERRL, No scleral icterus; ENMT: Mouth and pharynx normal, Mucous membranes moist; Neck: Supple, Full range of motion, No lymphadenopathy; Cardiovascular: Regular rate and rhythm, No gallop; Respiratory: Breath sounds clear & equal bilaterally, No rales, rhonchi, wheezes.  Speaking full sentences with ease, Normal respiratory effort/excursion; Chest: Nontender, no deformity. Movement normal; Abdomen: Soft, Nontender, Nondistended, Normal bowel sounds; Genitourinary: No CVA tenderness; Extremities: Pulses normal, No tenderness, No edema, No calf edema or asymmetry.; Neuro: AA&Ox3, Major CN grossly intact.  Speech clear. No gross focal motor or sensory deficits in extremities.; Skin: Color normal, Warm, Dry.   ED Course  Procedures   MDM  MDM Reviewed: previous chart, nursing note and vitals Reviewed previous: labs and ECG Interpretation: labs, ECG and x-ray      Date: 11/01/2012  Rate: 62  Rhythm: normal sinus rhythm  QRS Axis: normal  Intervals: normal  ST/T Wave abnormalities: normal  Conduction Disutrbances:none  Narrative Interpretation:   Old EKG Reviewed: unchanged; no significant changes from previous EKG dated 06/13/2010.  Results for orders placed during the hospital encounter of 11/01/12   CBC WITH DIFFERENTIAL      Result Value Range   WBC 5.5  4.0 - 10.5 K/uL   RBC 4.31  3.87 - 5.11 MIL/uL   Hemoglobin 13.0  12.0 - 15.0  g/dL   HCT 45.4  09.8 - 11.9 %   MCV 87.5  78.0 - 100.0 fL   MCH 30.2  26.0 - 34.0 pg   MCHC 34.5  30.0 - 36.0 g/dL   RDW 14.7  82.9 - 56.2 %   Platelets 194  150 - 400 K/uL   Neutrophils Relative % 56  43 - 77 %   Neutro Abs 3.0  1.7 - 7.7 K/uL   Lymphocytes Relative 33  12 - 46 %   Lymphs Abs 1.8  0.7 - 4.0 K/uL   Monocytes Relative 6  3 - 12 %   Monocytes Absolute 0.3  0.1 - 1.0 K/uL   Eosinophils Relative 5  0 - 5 %   Eosinophils Absolute 0.3  0.0 - 0.7 K/uL   Basophils Relative 0  0 - 1 %   Basophils Absolute 0.0  0.0 - 0.1 K/uL  BASIC METABOLIC PANEL      Result Value Range   Sodium 139  135 - 145 mEq/L   Potassium 3.6  3.5 - 5.1 mEq/L   Chloride 101  96 - 112 mEq/L   CO2 28  19 - 32 mEq/L   Glucose, Bld 105 (*) 70 - 99 mg/dL   BUN 11  6 - 23 mg/dL   Creatinine, Ser 1.30  0.50 - 1.10 mg/dL   Calcium 86.5  8.4 - 78.4 mg/dL   GFR calc non Af Amer 86 (*) >90 mL/min   GFR calc Af Amer >90  >90 mL/min  TROPONIN I      Result Value Range   Troponin I <0.30  <0.30 ng/mL   Dg Chest 2 View 11/01/2012   CLINICAL DATA:  Chest pain  EXAM: CHEST  2 VIEW  COMPARISON:  04/12/2010  FINDINGS: The heart size and mediastinal contours are within normal limits. Both lungs are clear. Mild degenerative changes thoracic and lumbar spine.  IMPRESSION: No active cardiopulmonary disease.   Electronically Signed   By: Natasha Mead   On: 11/01/2012 17:18    1945:  Feels improved after ASA and SL ntg. TIMI 4. Dx and testing d/w pt and family.  Questions answered.  Verb understanding, agreeable to observation admit. T/C to [facility], case discussed, including:  HPI, pertinent PM/SHx, VS/PE, dx testing, ED course and treatment:  Agreeable to observation admit, requests to write temporary orders, obtain tele bed to team 1.     Laray Anger, DO 11/02/12  1530

## 2012-11-01 NOTE — ED Notes (Signed)
Pt c/o intermittent chest pain x 2 days.  Reports last night became diaphoretic with pain.  Reports movement and breathing does not make it worse.  Reports had similar pain last year and was told she had chest wall pain.  Denies any recent cough or cold, no injury, heavy lifting or pulling.

## 2012-11-01 NOTE — H&P (Signed)
[facility] History and Physical  [name] GNF:[id number] DOB: 1940/02/25 DOA: 11/01/2012   PCP: Samuel Jester, DO  Specialists: She is followed by Dr. Simona Huh with cardiology  Chief Complaint: Chest pain for last few days  HPI: [name] is a 72 y.o. female with a past medical history of coronary artery disease, with a stent placed to the LAD in 2010. She had a stress test in 2012, which showed a normal EF, without ischemia. She also has a history of hypertension, arthritis, and insomnia. She presents, because she's been having discomfort in her chest on and off for the last one week. The pain gets up to 9/10 in intensity. Located in the central part of the chest and is described as a dull, aching pain. It's worse with lying down and is better with sitting up. She does get significant acid reflux. Has had some shortness of breath. She's had increased sweating as well in the last few days. Sometimes the pain radiates to the back and hurts more when she takes deep breath. She has had some lightheadedness as well. Denies any leg swelling. No fever or chills. No cough. No nausea, vomiting. Denies any increase in the pain with activity. She tried taking aspirin with no relief. Currently, the pain is about 2-3/10 in intensity. She is not a very good historian.  Home Medications: Prior to Admission medications   Medication Sig Start Date End Date Taking? Authorizing Provider  Ascorbic Acid (VITAMIN C) 500 MG tablet Take 500 mg by mouth daily.      Historical Provider, MD  aspirin 325 MG tablet Take 325 mg by mouth daily.      Historical Provider, MD  Cholecalciferol (VITAMIN D) 400 UNITS capsule Take 400 Units by mouth daily.      Historical Provider, MD  Garlic 100 MG TABS Take 1 tablet by mouth daily.      Historical Provider, MD  HYDROcodone-acetaminophen (NORCO) 5-325 MG per tablet Take 1 tablet by mouth every 6 (six) hours as needed.      Historical Provider, MD  lidocaine  (LIDODERM) 5 % Place 1 patch onto the skin daily.  10/17/12   Historical Provider, MD  LUMIGAN 0.01 % SOLN 1 drop at bedtime.  10/24/12   Historical Provider, MD  meloxicam (MOBIC) 7.5 MG tablet Take 7.5 mg by mouth daily.    Historical Provider, MD  metoprolol tartrate (LOPRESSOR) 25 MG tablet Take 1 tablet (25 mg total) by mouth 2 (two) times daily. 04/18/12   Prescott Parma, PA-C  nitroGLYCERIN (NITROSTAT) 0.4 MG SL tablet Place 0.4 mg under the tongue every 5 (five) minutes as needed.      Historical Provider, MD  Omega-3 Fatty Acids (FISH OIL) 1000 MG CAPS Take 1 capsule by mouth 2 (two) times daily.      Historical Provider, MD  ranitidine (ZANTAC) 150 MG tablet Take 1 tablet (150 mg total) by mouth 2 (two) times daily. 07/19/10 07/19/11  June Leap, MD  simvastatin (ZOCOR) 40 MG tablet Take 40 mg by mouth at bedtime.      Historical Provider, MD  triamterene-hydrochlorothiazide (DYAZIDE) 37.5-25 MG per capsule Take 0.5 capsules by mouth every morning.     Historical Provider, MD  vitamin B-12 (CYANOCOBALAMIN) 100 MCG tablet Take 50 mcg by mouth daily.      Historical Provider, MD  zolpidem (AMBIEN) 10 MG tablet Take 10 mg by mouth at bedtime as needed.      Historical  Provider, MD    Allergies: No Known Allergies  Past Medical History: Past Medical History  Diagnosis Date  . Arthritis   . Glaucoma   . Herniated disc   . Essential hypertension, benign   . Dyslipidemia   . Type 2 diabetes mellitus     Borderline  . Coronary atherosclerosis of native coronary artery     DES LAD 1/10    Past Surgical History  Procedure Laterality Date  . Coronary angioplasty with stent placement  02/2008    [place]    Social History:  reports that she has never smoked. She has never used smokeless tobacco. She reports that she does not drink alcohol or use illicit drugs.  Living Situation: Lives with her son Activity Level: Independent with daily activities   Family History:  Family History   Problem Relation Age of Onset  . Heart attack Mother   . Cancer Father      Review of Systems - History obtained from the patient General ROS: positive for  - fatigue Psychological ROS: negative Ophthalmic ROS: negative ENT ROS: negative Allergy and Immunology ROS: negative Hematological and Lymphatic ROS: negative Endocrine ROS: negative Respiratory ROS: as in hpi Cardiovascular ROS: as in hpi Gastrointestinal ROS: no abdominal pain, change in bowel habits, or black or bloody stools Genito-Urinary ROS: no dysuria, trouble voiding, or hematuria Musculoskeletal ROS: negative Neurological ROS: no TIA or stroke symptoms Dermatological ROS: negative  Physical Examination  Filed Vitals:   11/01/12 1643 11/01/12 1808 11/01/12 1954 11/01/12 2049  BP: 149/64 136/65 152/64 170/74  Pulse: 63 61 64 62  Temp: 98.6 F (37 C)   98.3 F (36.8 C)  TempSrc: Oral     Resp: 18 12 20 20   Height: 5\' 5"  (1.651 m)   5\' 5"  (1.651 m)  Weight: 83.915 kg (185 lb)   81.4 kg (179 lb 7.3 oz)  SpO2: 95% 96% 98% 99%    General appearance: alert, cooperative, appears stated age and no distress Head: Normocephalic, without obvious abnormality, atraumatic Eyes: conjunctivae/corneas clear. PERRL, EOM's intact. Throat: lips, mucosa, and tongue normal; teeth and gums normal Neck: no adenopathy, no carotid bruit, no JVD, supple, symmetrical, trachea midline and thyroid not enlarged, symmetric, no tenderness/mass/nodules Resp: clear to auscultation bilaterally Chest wall: no tenderness Cardio: regular rate and rhythm, S1, S2 normal, no murmur, click, rub or gallop GI: soft, non-tender; bowel sounds normal; no masses,  no organomegaly Extremities: extremities normal, atraumatic, no cyanosis or edema Pulses: 2+ and symmetric Skin: Skin color, texture, turgor normal. No rashes or lesions Lymph nodes: Cervical, supraclavicular, and axillary nodes normal. Neurologic: Alert and oriented X 3, normal strength  and tone. No focal deficits  Laboratory Data: Results for orders placed during the hospital encounter of 11/01/12 (from the past 48 hour(s))  CBC WITH DIFFERENTIAL     Status: None   Collection Time    11/01/12  5:00 PM      Result Value Range   WBC 5.5  4.0 - 10.5 K/uL   RBC 4.31  3.87 - 5.11 MIL/uL   Hemoglobin 13.0  12.0 - 15.0 g/dL   HCT 16.1  09.6 - 04.5 %   MCV 87.5  78.0 - 100.0 fL   MCH 30.2  26.0 - 34.0 pg   MCHC 34.5  30.0 - 36.0 g/dL   RDW 40.9  81.1 - 91.4 %   Platelets 194  150 - 400 K/uL   Neutrophils Relative % 56  43 - 77 %  Neutro Abs 3.0  1.7 - 7.7 K/uL   Lymphocytes Relative 33  12 - 46 %   Lymphs Abs 1.8  0.7 - 4.0 K/uL   Monocytes Relative 6  3 - 12 %   Monocytes Absolute 0.3  0.1 - 1.0 K/uL   Eosinophils Relative 5  0 - 5 %   Eosinophils Absolute 0.3  0.0 - 0.7 K/uL   Basophils Relative 0  0 - 1 %   Basophils Absolute 0.0  0.0 - 0.1 K/uL  BASIC METABOLIC PANEL     Status: Abnormal   Collection Time    11/01/12  5:00 PM      Result Value Range   Sodium 139  135 - 145 mEq/L   Potassium 3.6  3.5 - 5.1 mEq/L   Chloride 101  96 - 112 mEq/L   CO2 28  19 - 32 mEq/L   Glucose, Bld 105 (*) 70 - 99 mg/dL   BUN 11  6 - 23 mg/dL   Creatinine, Ser 4.78  0.50 - 1.10 mg/dL   Calcium 29.5  8.4 - 62.1 mg/dL   GFR calc non Af Amer 86 (*) >90 mL/min   GFR calc Af Amer >90  >90 mL/min   Comment: (NOTE)     The eGFR has been calculated using the CKD EPI equation.     This calculation has not been validated in all clinical situations.     eGFR's persistently <90 mL/min signify possible Chronic Kidney     Disease.  TROPONIN I     Status: None   Collection Time    11/01/12  5:00 PM      Result Value Range   Troponin I <0.30  <0.30 ng/mL   Comment:            Due to the release kinetics of cTnI,     a negative result within the first hours     of the onset of symptoms does not rule out     myocardial infarction with certainty.     If myocardial infarction is  still suspected,     repeat the test at appropriate intervals.    Radiology Reports: Dg Chest 2 View  11/01/2012   CLINICAL DATA:  Chest pain  EXAM: CHEST  2 VIEW  COMPARISON:  04/12/2010  FINDINGS: The heart size and mediastinal contours are within normal limits. Both lungs are clear. Mild degenerative changes thoracic and lumbar spine.  IMPRESSION: No active cardiopulmonary disease.   Electronically Signed   By: Natasha Mead   On: 11/01/2012 17:18    Electrocardiogram: EKG shows sinus rhythm at 62 beats per minute. Normal axis. Intervals are normal. No definite Q waves. No concerning ST or T-wave changes are noted.  Problem List  Principal Problem:   Chest pain Active Problems:   HYPERLIPIDEMIA   ESSENTIAL HYPERTENSION   Coronary atherosclerosis of native coronary artery   Assessment: This is a 72 year old, African American female, with a known history of CAD who presents with retrosternal chest pain. Differential diagnoses include angina, GERD, venous thromboembolism.  Plan: #1 chest pain: We'll get a CT angio of her chest. We will rule her out for acute coronary syndrome by serial troponins. EKG does not show any acute ischemic changes. She'll be given PPI. She'll be observed overnight.  #2 history of CAD: Continue with antiplatelet agent, as well as beta blocker. Continue with statin medication as well.  #3 history of hypertension: Continue with her antihypertensive regimen.  Blood pressure is noted to be elevated. She might require additional agents. We will monitor her for now.  DVT Prophylaxis: Lovenox Code Status: Full code Family Communication: Discussed with the patient and her daughter  Disposition Plan: Observe on telemetry   Further management decisions will depend on results of further testing and patient's response to treatment.  [facility]  [facility] Pager [phone]  If 7PM-7AM, please contact night-coverage [email or web address] Password  [facility]  11/01/2012, 9:31 PM

## 2012-11-02 DIAGNOSIS — E785 Hyperlipidemia, unspecified: Secondary | ICD-10-CM

## 2012-11-02 LAB — COMPREHENSIVE METABOLIC PANEL
ALT: 17 U/L (ref 0–35)
AST: 17 U/L (ref 0–37)
Albumin: 3.6 g/dL (ref 3.5–5.2)
Alkaline Phosphatase: 77 U/L (ref 39–117)
BUN: 9 mg/dL (ref 6–23)
CO2: 30 mEq/L (ref 19–32)
Calcium: 9.7 mg/dL (ref 8.4–10.5)
Chloride: 99 mEq/L (ref 96–112)
Creatinine, Ser: 0.72 mg/dL (ref 0.50–1.10)
GFR calc Af Amer: >90 mL/min (ref >90)
GFR calc non Af Amer: 84 mL/min — ABNORMAL LOW (ref >90)
Glucose, Bld: 112 mg/dL — ABNORMAL HIGH (ref 70–99)
Potassium: 3.5 mEq/L (ref 3.5–5.1)
Sodium: 137 mEq/L (ref 135–145)
Total Bilirubin: 0.4 mg/dL (ref 0.3–1.2)
Total Protein: 6.4 g/dL (ref 6.0–8.3)

## 2012-11-02 LAB — CBC
MCH: 29.8 pg (ref 26.0–34.0)
Platelets: 190 10*3/uL (ref 150–400)
RBC: 4.13 MIL/uL (ref 3.87–5.11)

## 2012-11-02 LAB — TROPONIN I
Troponin I: <0.3 ng/mL (ref <0.30)
Troponin I: <0.3 ng/mL (ref <0.30)

## 2012-11-02 MED ORDER — TRAZODONE HCL 50 MG PO TABS: 50.0000 mg | ORAL_TABLET | Freq: Every day | ORAL | Status: DC

## 2012-11-02 MED ORDER — TRIAMTERENE-HCTZ 37.5-25 MG PO TABS
1.0000 | ORAL_TABLET | Freq: Every day | ORAL | Status: DC
  Administered 2012-11-02: 1 via ORAL
  Filled 2012-11-02: qty 1

## 2012-11-02 NOTE — Progress Notes (Signed)
Pt verbalizes understanding of discharge instructions, medications, and follow up visits. Prescription was in hand upon discharge. IV was d/c'd without complications. Pt was discharged accompanied by family and nurse. No questions at this time. Vergie Living

## 2012-11-02 NOTE — Discharge Summary (Signed)
Physician Discharge Summary  [name] JXB:[id number] DOB: Aug 14, 1940 DOA: 11/01/2012  PCP: Samuel Jester, DO  Admit date: 11/01/2012 Discharge date: 11/02/2012  Time spent: 40 minutes  Recommendations for Outpatient Follow-up:  1. Followup with cardiology as scheduled 2. Followup with primary care doctor in one to 2 weeks.  Discharge Diagnoses:  Principal Problem:   Chest pain Active Problems:   HYPERLIPIDEMIA   ESSENTIAL HYPERTENSION   Coronary atherosclerosis of native coronary artery   Discharge Condition: stable  Diet recommendation: low salt  Filed Weights   11/01/12 1643 11/01/12 2049  Weight: 83.915 kg (185 lb) 81.4 kg (179 lb 7.3 oz)    History of present illness:  [name] is a 72 y.o. female with a past medical history of coronary artery disease, with a stent placed to the LAD in 2010. She had a stress test in 2012, which showed a normal EF, without ischemia. She also has a history of hypertension, arthritis, and insomnia. She presents, because she's been having discomfort in her chest on and off for the last one week. The pain gets up to 9/10 in intensity. Located in the central part of the chest and is described as a dull, aching pain. It's worse with lying down and is better with sitting up. She does get significant acid reflux. Has had some shortness of breath. She's had increased sweating as well in the last few days. Sometimes the pain radiates to the back and hurts more when she takes deep breath. She has had some lightheadedness as well. Denies any leg swelling. No fever or chills. No cough. No nausea, vomiting. Denies any increase in the pain with activity. She tried taking aspirin with no relief. Currently, the pain is about 2-3/10 in intensity. She is not a very good historian.   Hospital Course:  This patient was admitted to the hospital for chest discomfort. She has known coronary artery disease with DES to the LAD in 2010 and negative stress  Myoview in 2012. She is followed by cardiology as an outpatient. The patient is a poor historian but describes chest discomfort, substernally with radiation to her back. This occurred the night prior to admission and woke her up from sleep. She reports that the pain has never really resolved since onset. It is not affected by exertion. Pain is worse while lying down and improved with sitting up. She has had issues with acid reflux in the past. She does not feel that her current symptoms are related to reflux. She was admitted to a telemetry bed and monitored in the hospital. Cardiac enzymes were found to be negative. EKG did not show any acute changes. The patient is ambulating without any worsening of her symptoms. CT angiogram of the chest was also checked which was negative for underlying pulmonary embolus. The patient was felt stable to discharge home today followup with cardiology as previously scheduled. She has an appointment next 1-2 weeks. Further testing such as repeat Myoview can be discussed on that visit. We'll continue her on aspirin and all her other medications. I recommended a trial of proton inhibitor to see if this will improve her symptoms.  Procedures:  none  Consultations:  none  Discharge Exam: Filed Vitals:   11/02/12 1356  BP: 126/55  Pulse: 65  Temp:   Resp: 18    General: NAD Cardiovascular: S1, S2 RRR Respiratory: CTA B  Discharge Instructions  Discharge Orders   Future Appointments Provider Department Dept Phone   11/13/2012 11:40  AM Jonelle Sidle, MD [facility] [place] [phone]   Future Orders Complete By Expires   Call MD for:  difficulty breathing, headache or visual disturbances  As directed    Call MD for:  persistant dizziness or light-headedness  As directed    Call MD for:  severe uncontrolled pain  As directed    Diet - low sodium heart healthy  As directed    Increase activity slowly  As directed        Medication  List         aspirin 325 MG tablet  Take 325 mg by mouth daily.     bimatoprost 0.01 % Soln  Commonly known as:  LUMIGAN  Place 1 drop into both eyes at bedtime.     Fish Oil 1000 MG Caps  Take 1 capsule by mouth 2 (two) times daily.     Garlic 100 MG Tabs  Take 1 tablet by mouth daily.     HYDROcodone-acetaminophen 5-325 MG per tablet  Commonly known as:  NORCO/VICODIN  Take 1 tablet by mouth every 6 (six) hours as needed for pain.     lidocaine 5 %  Commonly known as:  LIDODERM  Place 1 patch onto the skin daily.     metoprolol tartrate 25 MG tablet  Commonly known as:  LOPRESSOR  Take 1 tablet (25 mg total) by mouth 2 (two) times daily.     nitroGLYCERIN 0.4 MG SL tablet  Commonly known as:  NITROSTAT  Place 0.4 mg under the tongue every 5 (five) minutes as needed for chest pain.     simvastatin 40 MG tablet  Commonly known as:  ZOCOR  Take 40 mg by mouth at bedtime.     traZODone 50 MG tablet  Commonly known as:  DESYREL  Take 1 tablet (50 mg total) by mouth at bedtime.     triamterene-hydrochlorothiazide 37.5-25 MG per tablet  Commonly known as:  MAXZIDE-25  Take 0.5 tablets by mouth daily.     TUMS PO  Take 2 tablets by mouth at bedtime.     VITAMIN B 12 PO  Take 1 tablet by mouth daily.     vitamin C 500 MG tablet  Commonly known as:  ASCORBIC ACID  Take 500 mg by mouth daily.     Vitamin D 400 UNITS capsule  Take 400 Units by mouth daily.     zolpidem 10 MG tablet  Commonly known as:  AMBIEN  Take 10 mg by mouth at bedtime as needed for sleep.       No Known Allergies     Follow-up Information   Follow up with BUTLER, CYNTHIA, DO. Schedule an appointment as soon as possible for a visit in 2 weeks.   Contact information:   [address] [place] [place] [address] [phone]       Follow up with Nona Dell, MD. (as scheduled)    Specialty:  Cardiology   Contact information:   [address] [place]  [address] [phone]        The results of significant diagnostics from this hospitalization (including imaging, microbiology, ancillary and laboratory) are listed below for reference.    Significant Diagnostic Studies: Dg Chest 2 View  11/01/2012   CLINICAL DATA:  Chest pain  EXAM: CHEST  2 VIEW  COMPARISON:  04/12/2010  FINDINGS: The heart size and mediastinal contours are within normal limits. Both lungs are clear. Mild degenerative changes thoracic and  lumbar spine.  IMPRESSION: No active cardiopulmonary disease.   Electronically Signed   By: Natasha Mead   On: 11/01/2012 17:18   Ct Angio Chest Pe W/cm &/or Wo Cm  11/01/2012   CLINICAL DATA:  Chest pain.  EXAM: CT ANGIOGRAPHY CHEST WITH CONTRAST  TECHNIQUE: Multidetector CT imaging of the chest was performed using the standard protocol during bolus administration of intravenous contrast. Multiplanar CT image reconstructions including MIPs were obtained to evaluate the vascular anatomy.  CONTRAST:  OMNIPAQUE IOHEXOL 350 MG/ML SOLN  COMPARISON:  None.  FINDINGS: No filling defects in the pulmonary arteries to suggest pulmonary emboli. Heart is normal size. Aorta is normal caliber. Coronary artery calcifications in the left main coronary artery, left anterior descending artery and near the origin of the right coronary artery. Aorta is normal caliber. No mediastinal, hilar, or axillary adenopathy. Visualized thyroid and chest wall soft tissues unremarkable. Imaging into the upper abdomen shows no acute findings.  Lungs are clear. No focal airspace opacitiesor suspicious nodules. No effusions. No acute bony abnormality.  Review of the MIP images confirms the above findings.  IMPRESSION: No evidence of pulmonary embolus.  Coronary artery disease.  No acute findings.   Electronically Signed   By: Charlett Nose M.D.   On: 11/01/2012 21:54    Microbiology: No results found for this or any previous visit (from the past 240 hour(s)).   Labs: Basic  Metabolic Panel:  Recent Labs Lab 11/01/12 1700 11/02/12 0608  NA 139 137  K 3.6 3.5  CL 101 99  CO2 28 30  GLUCOSE 105* 112*  BUN 11 9  CREATININE 0.67 0.72  CALCIUM 10.4 9.7   Liver Function Tests:  Recent Labs Lab 11/01/12 2130 11/02/12 0608  AST 23 17  ALT 21 17  ALKPHOS 89 77  BILITOT 0.2* 0.4  PROT 7.0 6.4  ALBUMIN 4.0 3.6    Recent Labs Lab 11/01/12 2130  LIPASE 35   No results found for this basename: AMMONIA,  in the last 168 hours CBC:  Recent Labs Lab 11/01/12 1700 11/02/12 0608  WBC 5.5 5.2  NEUTROABS 3.0  --   HGB 13.0 12.3  HCT 37.7 36.2  MCV 87.5 87.7  PLT 194 190   Cardiac Enzymes:  Recent Labs Lab 11/01/12 1700 11/01/12 2306 11/02/12 0608 11/02/12 1134  TROPONINI <0.30 <0.30 <0.30 <0.30   BNP: BNP (last 3 results) No results found for this basename: PROBNP,  in the last 8760 hours CBG: No results found for this basename: GLUCAP,  in the last 168 hours     Signed:  Malichi Palardy  [facility] 11/02/2012, 2:19 PM

## 2012-11-12 ENCOUNTER — Encounter: Payer: Self-pay | Admitting: Cardiology

## 2012-11-13 ENCOUNTER — Ambulatory Visit (INDEPENDENT_AMBULATORY_CARE_PROVIDER_SITE_OTHER): Payer: Medicare Other | Admitting: Cardiology

## 2012-11-13 ENCOUNTER — Encounter: Payer: Self-pay | Admitting: Cardiology

## 2012-11-13 VITALS — BP 118/72 | HR 76 | Ht 65.5 in | Wt 178.0 lb

## 2012-11-13 DIAGNOSIS — I10 Essential (primary) hypertension: Secondary | ICD-10-CM

## 2012-11-13 DIAGNOSIS — I2510 Atherosclerotic heart disease of native coronary artery without angina pectoris: Secondary | ICD-10-CM

## 2012-11-13 MED ORDER — POTASSIUM CHLORIDE ER 10 MEQ PO TBCR: 10.0000 meq | EXTENDED_RELEASE_TABLET | Freq: Every day | ORAL | Status: DC

## 2012-11-13 NOTE — Progress Notes (Signed)
Clinical Summary [name] is a 72 y.o.female last seen in December 2013. Record review finds hospitalization at [facility] just recently with chest pain. She ruled out for myocardial infarction by enzymes. CT angiogram of the chest was negative for pulmonary embolus. She was not seen in consultation by our practice.  ECG from September 12 reviewed showing normal sinus rhythm. She states that she has been feeling better, has a "cold" with occasional cough and chest congestion. The chest pain she describes seems to be mainly at nighttime when she lies down. During the day when she is up and active she does not have the symptoms. She has not had a recent echocardiogram.  Exercise Myoview in February of 2012 demonstrated LVEF 81% without ischemia. Hypertensive response noted. Recent lab work reviewed finding potassium 3.5, BUN 9, creatinine 0.7, hemoglobin 12.3, platelets 190.   No Known Allergies  Current Outpatient Prescriptions  Medication Sig Dispense Refill  . Ascorbic Acid (VITAMIN C) 500 MG tablet Take 500 mg by mouth daily.        Marland Kitchen aspirin 325 MG tablet Take 325 mg by mouth daily.        . bimatoprost (LUMIGAN) 0.01 % SOLN Place 1 drop into both eyes at bedtime.      . Calcium Carbonate Antacid (TUMS PO) Take 2 tablets by mouth as needed.       . Cholecalciferol (VITAMIN D) 400 UNITS capsule Take 400 Units by mouth daily.        . Cyanocobalamin (VITAMIN B 12 PO) Take 1 tablet by mouth daily.      . Garlic 100 MG TABS Take 1 tablet by mouth daily.        Marland Kitchen HYDROcodone-acetaminophen (NORCO/VICODIN) 5-325 MG per tablet Take 1 tablet by mouth every 6 (six) hours as needed for pain.      Marland Kitchen lidocaine (LIDODERM) 5 % Place 1 patch onto the skin daily.       . metoprolol tartrate (LOPRESSOR) 25 MG tablet Take 1 tablet (25 mg total) by mouth 2 (two) times daily.  180 tablet  3  . nitroGLYCERIN (NITROSTAT) 0.4 MG SL tablet Place 0.4 mg under the tongue every 5 (five) minutes as needed for chest  pain.      . Omega-3 Fatty Acids (FISH OIL) 1000 MG CAPS Take 1 capsule by mouth 2 (two) times daily.        . ranitidine (ZANTAC) 150 MG tablet Take 150 mg by mouth at bedtime.      . simvastatin (ZOCOR) 40 MG tablet Take 40 mg by mouth at bedtime.        . traZODone (DESYREL) 50 MG tablet Take 1 tablet (50 mg total) by mouth at bedtime.  30 tablet  0  . triamterene-hydrochlorothiazide (MAXZIDE-25) 37.5-25 MG per tablet Take 0.5 tablets by mouth daily.      Marland Kitchen zolpidem (AMBIEN) 10 MG tablet Take 10 mg by mouth at bedtime as needed for sleep.      . potassium chloride (K-DUR) 10 MEQ tablet Take 1 tablet (10 mEq total) by mouth daily.  90 tablet  3   No current facility-administered medications for this visit.    Past Medical History  Diagnosis Date  . Arthritis   . Glaucoma   . Herniated disc   . Essential hypertension, benign   . Dyslipidemia   . Type 2 diabetes mellitus     Borderline  . Coronary atherosclerosis of native coronary artery     DES  LAD 1/10    Social History [name] reports that she has never smoked. She has never used smokeless tobacco. [name] reports that she does not drink alcohol.  Review of Systems She has leg cramps at nighttime. Reviewed recent lab work showing low normal potassium levels. She is not on a supplement. Otherwise negative.  Physical Examination Filed Vitals:   11/13/12 1156  BP: 118/72  Pulse: 76   Filed Weights   11/13/12 1156  Weight: 178 lb (80.74 kg)    No acute distress.  HEENT: Conjunctiva and lids normal, oropharynx clear.  Neck: Supple, no elevated JVP or carotid bruits, no thyromegaly.  Lungs: Clear to auscultation, nonlabored breathing at rest.  Cardiac: Regular rate and rhythm, no S3 or significant systolic murmur, no pericardial rub.  Abdomen: Soft, nontender, bowel sounds present.  Extremities: No pitting edema, distal pulses 2+.  Skin: Warm and dry.  Musculoskeletal: No kyphosis.  Neuropsychiatric: Alert and  oriented x3, affect grossly appropriate.   Problem List and Plan   Coronary atherosclerosis of native coronary artery ECG reviewed and stable, recent cardiac markers normal during hospital observation. She is reporting some chest pain symptoms that are quite atypical. Ischemic testing approximately 2 years ago was reassuring. We will proceed with an echocardiogram to assess cardiac structure and function, particularly since she is reporting symptoms at nighttime when she is lying down. Otherwise no clear evidence of heart failure. Will call her with the results.  ESSENTIAL HYPERTENSION Blood pressure well controlled. She is on a diuretic, reporting some leg cramps and has low normal potassium levels. Add potassium 10 mEq daily.    Jonelle Sidle, M.D., F.A.C.C.

## 2012-11-13 NOTE — Assessment & Plan Note (Signed)
ECG reviewed and stable, recent cardiac markers normal during hospital observation. She is reporting some chest pain symptoms that are quite atypical. Ischemic testing approximately 2 years ago was reassuring. We will proceed with an echocardiogram to assess cardiac structure and function, particularly since she is reporting symptoms at nighttime when she is lying down. Otherwise no clear evidence of heart failure. Will call her with the results.

## 2012-11-13 NOTE — Assessment & Plan Note (Signed)
Blood pressure well controlled. She is on a diuretic, reporting some leg cramps and has low normal potassium levels. Add potassium 10 mEq daily.

## 2012-11-13 NOTE — Patient Instructions (Addendum)
Your physician recommends that you schedule a follow-up appointment in: 6 months. You will receive a reminder letter in the mail in about 4 months reminding you to call and schedule your appointment. If you don't receive this letter, please contact our office. Your physician has recommended you make the following change in your medication: Start potassium chloride 10 meq by mouth once a day. Your new prescription has been sent to your pharmacy. All other medications will remain the same. Your physician has requested that you have an echocardiogram. Echocardiography is a painless test that uses sound waves to create images of your heart. It provides your doctor with information about the size and shape of your heart and how well your heart's chambers and valves are working. This procedure takes approximately one hour. There are no restrictions for this procedure.

## 2012-11-27 ENCOUNTER — Other Ambulatory Visit: Payer: Self-pay

## 2012-11-27 ENCOUNTER — Other Ambulatory Visit (INDEPENDENT_AMBULATORY_CARE_PROVIDER_SITE_OTHER): Payer: Medicare Other

## 2012-11-27 DIAGNOSIS — R079 Chest pain, unspecified: Secondary | ICD-10-CM

## 2012-11-27 DIAGNOSIS — I2510 Atherosclerotic heart disease of native coronary artery without angina pectoris: Secondary | ICD-10-CM

## 2012-11-27 DIAGNOSIS — I359 Nonrheumatic aortic valve disorder, unspecified: Secondary | ICD-10-CM

## 2012-11-28 ENCOUNTER — Telehealth: Payer: Self-pay | Admitting: *Deleted

## 2012-11-28 NOTE — Telephone Encounter (Signed)
Patient informed. 

## 2012-11-28 NOTE — Telephone Encounter (Signed)
Message copied by Eustace Moore on Thu Nov 28, 2012  3:04 PM ------      Message from: MCDOWELL, Illene Bolus      Created: Wed Nov 27, 2012  3:29 PM       Reviewed. Please let her know LVEF remains in normal range. Does have some diastolic dysfunction, would therefore stay on diuretic and antihypertensives. No major valvular abnormalities. ------

## 2013-05-05 ENCOUNTER — Other Ambulatory Visit: Payer: Self-pay | Admitting: Cardiology

## 2013-05-05 MED ORDER — METOPROLOL TARTRATE 25 MG PO TABS: 25.0000 mg | ORAL_TABLET | Freq: Two times a day (BID) | ORAL | Status: DC

## 2013-06-17 ENCOUNTER — Ambulatory Visit (INDEPENDENT_AMBULATORY_CARE_PROVIDER_SITE_OTHER): Payer: Private Health Insurance - Indemnity | Admitting: Cardiology

## 2013-06-17 ENCOUNTER — Encounter: Payer: Self-pay | Admitting: Cardiology

## 2013-06-17 VITALS — BP 134/74 | HR 74 | Ht 65.5 in | Wt 162.1 lb

## 2013-06-17 DIAGNOSIS — E785 Hyperlipidemia, unspecified: Secondary | ICD-10-CM

## 2013-06-17 DIAGNOSIS — I10 Essential (primary) hypertension: Secondary | ICD-10-CM

## 2013-06-17 DIAGNOSIS — I2510 Atherosclerotic heart disease of native coronary artery without angina pectoris: Secondary | ICD-10-CM

## 2013-06-17 NOTE — Assessment & Plan Note (Signed)
Symptomatically stable on medical therapy. Last ischemic workup was in February 2012 as detailed above. Continue observation for now. We can discuss followup evaluation over the next year.

## 2013-06-17 NOTE — Progress Notes (Signed)
Clinical Summary  [name] is a 73 y.o.female last seen in September 2014. From a cardiac perspective, she has been stable, no angina symptoms. She has had some trouble with "wheezing" over the spring, trouble with pollen.  Echocardiogram from October 2014 demonstrated mild LVH with LVEF 17-61%, grade 1 diastolic dysfunction, MAC with trivial mitral regurgitation, trivial tricuspid regurgitation with PASP 20 mm mercury, pericardial fat pad noted. We reviewed this today.  Exercise Myoview in February of 2012 demonstrated LVEF 81% without ischemia.   No Known Allergies  Current Outpatient Prescriptions  Medication Sig Dispense Refill  . Ascorbic Acid (VITAMIN C) 500 MG tablet Take 500 mg by mouth daily.        Marland Kitchen aspirin 325 MG tablet Take 325 mg by mouth daily.        . bimatoprost (LUMIGAN) 0.01 % SOLN Place 1 drop into both eyes at bedtime.      . Calcium Carbonate Antacid (TUMS PO) Take 2 tablets by mouth as needed.       . Cholecalciferol (VITAMIN D) 400 UNITS capsule Take 400 Units by mouth daily.        . Cyanocobalamin (VITAMIN B 12 PO) Take 1 tablet by mouth daily.      . Garlic 607 MG TABS Take 1 tablet by mouth daily.        Marland Kitchen HYDROcodone-acetaminophen (NORCO/VICODIN) 5-325 MG per tablet Take 1 tablet by mouth every 6 (six) hours as needed for pain.      . metoprolol tartrate (LOPRESSOR) 25 MG tablet Take 1 tablet (25 mg total) by mouth 2 (two) times daily.  180 tablet  0  . nitroGLYCERIN (NITROSTAT) 0.4 MG SL tablet Place 0.4 mg under the tongue every 5 (five) minutes as needed for chest pain.      . Omega-3 Fatty Acids (FISH OIL) 1000 MG CAPS Take 1 capsule by mouth 2 (two) times daily.        . potassium chloride (K-DUR) 10 MEQ tablet Take 1 tablet (10 mEq total) by mouth daily.  90 tablet  3  . ranitidine (ZANTAC) 150 MG tablet Take 150 mg by mouth at bedtime.      . simvastatin (ZOCOR) 40 MG tablet Take 40 mg by mouth at bedtime.        . traZODone (DESYREL) 50 MG  tablet Take 1 tablet (50 mg total) by mouth at bedtime.  30 tablet  0  . triamterene-hydrochlorothiazide (MAXZIDE-25) 37.5-25 MG per tablet Take 0.5 tablets by mouth daily.      Marland Kitchen zolpidem (AMBIEN) 10 MG tablet Take 10 mg by mouth at bedtime as needed for sleep.       No current facility-administered medications for this visit.    Past Medical History  Diagnosis Date  . Arthritis   . Glaucoma   . Herniated disc   . Essential hypertension, benign   . Dyslipidemia   . Type 2 diabetes mellitus     Borderline  . Coronary atherosclerosis of native coronary artery     DES LAD 1/10    Social History [name] reports that she has never smoked. She has never used smokeless tobacco. [name] reports that she does not drink alcohol.  Review of Systems No palpitations, dizziness, syncope.  Physical Examination Filed Vitals:   06/17/13 1433  BP: 134/74  Pulse: 74   Filed Weights   06/17/13 1433  Weight: 162 lb 1.9 oz (73.537 kg)    No acute distress.  HEENT: Conjunctiva and lids normal, oropharynx clear.  Neck: Supple, no elevated JVP or carotid bruits, no thyromegaly.  Lungs: Clear to auscultation, nonlabored breathing at rest.  Cardiac: Regular rate and rhythm, no S3 or significant systolic murmur, no pericardial rub.  Abdomen: Soft, nontender, bowel sounds present.  Extremities: No pitting edema, distal pulses 2+.  Skin: Warm and dry.  Musculoskeletal: No kyphosis.  Neuropsychiatric: Alert and oriented x3, affect grossly appropriate.   Problem List and Plan   Coronary atherosclerosis of native coronary artery Symptomatically stable on medical therapy. Last ischemic workup was in February 2012 as detailed above. Continue observation for now. We can discuss followup evaluation over the next year.  ESSENTIAL HYPERTENSION Blood pressure control is reasonable today. No changes made.  HYPERLIPIDEMIA She continues on Zocor, followed by Dr. Melina Copa.    Satira Sark, M.D., F.A.C.C.

## 2013-06-17 NOTE — Assessment & Plan Note (Signed)
Blood pressure control is reasonable today. No changes made.

## 2013-06-17 NOTE — Patient Instructions (Signed)
Continue all current medications. Your physician wants you to follow up in: 6 months.  You will receive a reminder letter in the mail one-two months in advance.  If you don't receive a letter, please call our office to schedule the follow up appointment   

## 2013-06-17 NOTE — Assessment & Plan Note (Signed)
She continues on Zocor, followed by Dr. Melina Copa.

## 2013-08-14 ENCOUNTER — Other Ambulatory Visit: Payer: Self-pay | Admitting: Cardiology

## 2013-12-23 ENCOUNTER — Encounter: Payer: Self-pay | Admitting: Cardiology

## 2013-12-23 ENCOUNTER — Encounter: Payer: Self-pay | Admitting: *Deleted

## 2013-12-23 ENCOUNTER — Ambulatory Visit (INDEPENDENT_AMBULATORY_CARE_PROVIDER_SITE_OTHER): Payer: Private Health Insurance - Indemnity | Admitting: Cardiology

## 2013-12-23 VITALS — BP 150/76 | HR 73 | Ht 65.0 in | Wt 187.8 lb

## 2013-12-23 DIAGNOSIS — I10 Essential (primary) hypertension: Secondary | ICD-10-CM

## 2013-12-23 DIAGNOSIS — R0602 Shortness of breath: Secondary | ICD-10-CM

## 2013-12-23 DIAGNOSIS — E782 Mixed hyperlipidemia: Secondary | ICD-10-CM

## 2013-12-23 DIAGNOSIS — I2510 Atherosclerotic heart disease of native coronary artery without angina pectoris: Secondary | ICD-10-CM

## 2013-12-23 MED ORDER — METOPROLOL TARTRATE 25 MG PO TABS: 25.0000 mg | ORAL_TABLET | Freq: Two times a day (BID) | ORAL | Status: DC

## 2013-12-23 NOTE — Progress Notes (Signed)
Reason for visit: CAD, hypertension, hyperlipidemia  Clinical Summary Ms. Kowaleski is a 73 y.o.female last seen in April. She states that she has had a few "burning" chest pain symptoms have occurred mainly in the evenings. It is not exertional. Symptoms are mild to moderate. She is also noted perhaps somewhat more shortness of breath with typical ADLs such as taking out the trash. She has not required any nitroglycerin and otherwise reports compliance with aspirin, beta blocker, and statin. ECG today shows normal sinus rhythm.  Blood pressure is mildly elevated today. She continues to follow with Dr. Melina Copa, in fact had recent lab work which I am requesting for review. She continues on diuretic and beta blocker.  Lipid panel results are pending my review, she is on omega-3 supplements and statin therapy.she tells me that Dr. Melina Copa did not have to change any medication doses.  Echocardiogram from October 2014 demonstrated mild LVH with LVEF 95-09%, grade 1 diastolic dysfunction, MAC with trivial mitral regurgitation, trivial tricuspid regurgitation with PASP 20 mm mercury, pericardial fat pad noted. Exercise Myoview in February of 2012 demonstrated LVEF 81% without ischemia.  No Known Allergies  Current Outpatient Prescriptions  Medication Sig Dispense Refill  . Ascorbic Acid (VITAMIN C) 500 MG tablet Take 500 mg by mouth daily.      Marland Kitchen aspirin 325 MG tablet Take 325 mg by mouth daily.      . bimatoprost (LUMIGAN) 0.01 % SOLN Place 1 drop into both eyes at bedtime.    . Calcium Carbonate Antacid (TUMS PO) Take 2 tablets by mouth as needed.     . Cholecalciferol (VITAMIN D) 400 UNITS capsule Take 400 Units by mouth daily.      . Cyanocobalamin (VITAMIN B 12 PO) Take 1 tablet by mouth daily.    . Garlic 326 MG TABS Take 1 tablet by mouth daily.      Marland Kitchen HYDROcodone-acetaminophen (NORCO/VICODIN) 5-325 MG per tablet Take 1 tablet by mouth every 6 (six) hours as needed for pain.    Marland Kitchen MAGNESIUM  CARBONATE PO Take 1 tablet by mouth daily.    . meloxicam (MOBIC) 7.5 MG tablet Take 7.5 mg by mouth daily.    . metoprolol tartrate (LOPRESSOR) 25 MG tablet TAKE ONE TABLET BY MOUTH TWICE DAILY 180 tablet 3  . nitroGLYCERIN (NITROSTAT) 0.4 MG SL tablet Place 0.4 mg under the tongue every 5 (five) minutes as needed for chest pain.    . Omega-3 Fatty Acids (FISH OIL) 1000 MG CAPS Take 1 capsule by mouth 2 (two) times daily.      . potassium chloride (K-DUR) 10 MEQ tablet Take 1 tablet (10 mEq total) by mouth daily. 90 tablet 3  . simvastatin (ZOCOR) 40 MG tablet Take 40 mg by mouth at bedtime.      . traZODone (DESYREL) 50 MG tablet Take 1 tablet (50 mg total) by mouth at bedtime. 30 tablet 0  . triamterene-hydrochlorothiazide (MAXZIDE-25) 37.5-25 MG per tablet Take 0.5 tablets by mouth daily.    Marland Kitchen zolpidem (AMBIEN) 10 MG tablet Take 10 mg by mouth at bedtime as needed for sleep.     No current facility-administered medications for this visit.    Past Medical History  Diagnosis Date  . Arthritis   . Glaucoma   . Herniated disc   . Essential hypertension, benign   . Dyslipidemia   . Type 2 diabetes mellitus     Borderline  . Coronary atherosclerosis of native coronary artery  DES LAD 1/10    Social History Ms. Tavares reports that she has never smoked. She has never used smokeless tobacco. Ms. Bainter reports that she does not drink alcohol.  Review of Systems Complete review of systems negative except as otherwise outlined in the clinical summary and also the following.no palpitations or dizziness. No claudication.  Physical Examination Filed Vitals:   12/23/13 0856  BP: 150/76  Pulse: 73   Filed Weights   12/23/13 0856  Weight: 187 lb 12.8 oz (85.186 kg)    No acute distress.  HEENT: Conjunctiva and lids normal, oropharynx clear.  Neck: Supple, no elevated JVP or carotid bruits, no thyromegaly.  Lungs: Clear to auscultation, nonlabored breathing at rest.   Cardiac: Regular rate and rhythm, no S3 or significant systolic murmur, no pericardial rub.  Abdomen: Soft, nontender, bowel sounds present.  Extremities: No pitting edema, distal pulses 2+.  Skin: Warm and dry.  Musculoskeletal: No kyphosis.  Neuropsychiatric: Alert and oriented x3, affect grossly appropriate.   Problem List and Plan   Coronary atherosclerosis of native coronary artery She has experienced more shortness of breath with activity, some atypical chest pain symptoms as well. Last ischemic evaluation was greater than 3 years ago as outlined above. ECG today is normal. Plan is to follow-up with a Lexiscan Cardiolite on medical therapy including aspirin, beta blocker, and statin for reassessment of ischemic burden.  Mixed hyperlipidemia She continues on Zocor and omega-3 supplements. I am requesting recent lab work from Dr. Melina Copa for review for lipid panel.  Essential hypertension She is on beta blocker and diuretic, blood pressure is mildly elevated. I have recommended sodium restriction, weight loss. She should keep follow-up with Dr. Melina Copa, may be worth considering changing her diuretic to an ACE inhibitor diurestic or ARB diuretic combination.    Satira Sark, M.D., F.A.C.C.

## 2013-12-23 NOTE — Assessment & Plan Note (Signed)
She has experienced more shortness of breath with activity, some atypical chest pain symptoms as well. Last ischemic evaluation was greater than 3 years ago as outlined above. ECG today is normal. Plan is to follow-up with a Lexiscan Cardiolite on medical therapy including aspirin, beta blocker, and statin for reassessment of ischemic burden.

## 2013-12-23 NOTE — Patient Instructions (Signed)
Your physician recommends that you schedule a follow-up appointment in: 6 months. You will receive a reminder letter in the mail in about 4 months reminding you to call and schedule your appointment. If you don't receive this letter, please contact our office. Your physician recommends that you continue on your current medications as directed. Please refer to the Current Medication list given to you today. Your physician has requested that you have a lexiscan myoview. For further information please visit [email or web address]. Please follow instruction sheet, as given.  

## 2013-12-23 NOTE — Assessment & Plan Note (Signed)
She continues on Zocor and omega-3 supplements. I am requesting recent lab work from Dr. Melina Copa for review for lipid panel.

## 2013-12-23 NOTE — Assessment & Plan Note (Signed)
She is on beta blocker and diuretic, blood pressure is mildly elevated. I have recommended sodium restriction, weight loss. She should keep follow-up with Dr. Melina Copa, may be worth considering changing her diuretic to an ACE inhibitor diurestic or ARB diuretic combination.

## 2014-01-06 ENCOUNTER — Encounter (HOSPITAL_COMMUNITY)
Admission: RE | Admit: 2014-01-06 | Discharge: 2014-01-06 | Disposition: A | Discharge status: Home / Self Care | Payer: Medicare HMO | Source: Ambulatory Visit | Attending: Cardiology | Admitting: Cardiology

## 2014-01-06 ENCOUNTER — Encounter (HOSPITAL_COMMUNITY)
Admission: RE | Admit: 2014-01-06 | Discharge: 2014-01-06 | Disposition: A | Discharge status: Home / Self Care | Payer: Medicare HMO | Source: Ambulatory Visit | Attending: Cardiovascular Disease | Admitting: Cardiovascular Disease

## 2014-01-06 ENCOUNTER — Encounter (HOSPITAL_COMMUNITY): Payer: Self-pay

## 2014-01-06 ENCOUNTER — Ambulatory Visit (HOSPITAL_COMMUNITY)
Admission: RE | Admit: 2014-01-06 | Discharge: 2014-01-06 | Disposition: A | Discharge status: Home / Self Care | Payer: Medicare HMO | Source: Ambulatory Visit | Attending: Cardiology | Admitting: Cardiology

## 2014-01-06 DIAGNOSIS — I2510 Atherosclerotic heart disease of native coronary artery without angina pectoris: Secondary | ICD-10-CM

## 2014-01-06 DIAGNOSIS — R079 Chest pain, unspecified: Secondary | ICD-10-CM | POA: Insufficient documentation

## 2014-01-06 DIAGNOSIS — R0602 Shortness of breath: Secondary | ICD-10-CM | POA: Diagnosis not present

## 2014-01-06 DIAGNOSIS — Z955 Presence of coronary angioplasty implant and graft: Secondary | ICD-10-CM | POA: Diagnosis not present

## 2014-01-06 DIAGNOSIS — I10 Essential (primary) hypertension: Secondary | ICD-10-CM | POA: Diagnosis not present

## 2014-01-06 MED ORDER — SODIUM CHLORIDE 0.9 % IJ SOLN
INTRAMUSCULAR | Status: AC
  Administered 2014-01-06: 10 mL via INTRAVENOUS
  Filled 2014-01-06: qty 10

## 2014-01-06 MED ORDER — REGADENOSON 0.4 MG/5ML IV SOLN
INTRAVENOUS | Status: AC
  Administered 2014-01-06: 0.4 mg via INTRAVENOUS
  Filled 2014-01-06: qty 5

## 2014-01-06 MED ORDER — SODIUM CHLORIDE 0.9 % IJ SOLN
10.0000 mL | INTRAMUSCULAR | Status: DC | PRN
  Administered 2014-01-06: 10 mL via INTRAVENOUS
  Filled 2014-01-06: qty 10

## 2014-01-06 MED ORDER — TECHNETIUM TC 99M SESTAMIBI GENERIC - CARDIOLITE
30.0000 | Freq: Once | INTRAVENOUS | Status: AC | PRN
  Administered 2014-01-06: 30 via INTRAVENOUS

## 2014-01-06 MED ORDER — TECHNETIUM TC 99M SESTAMIBI - CARDIOLITE
10.0000 | Freq: Once | INTRAVENOUS | Status: AC | PRN
  Administered 2014-01-06: 10:00:00 10 via INTRAVENOUS

## 2014-01-06 MED ORDER — REGADENOSON 0.4 MG/5ML IV SOLN
0.4000 mg | Freq: Once | INTRAVENOUS | Status: AC | PRN
  Administered 2014-01-06: 0.4 mg via INTRAVENOUS

## 2014-01-06 NOTE — Progress Notes (Signed)
Stress Lab Nurses Notes - [name]  [name] 01/06/2014 Reason for doing test: CAD and HTN Type of test: Wille Glaser Nurse performing test: Gerrit Halls, RN Nuclear Medicine Tech: Melburn Hake Echo Tech: Not Applicable MD performing test: Koneswaran/K.Purcell Nails NP Family MD: Octavio Graves, DO Test explained and consent signed: Yes.   IV started: Saline lock flushed, No redness or edema and Saline lock started in radiology Symptoms: Flushed & dizziness Treatment/Intervention: None Reason test stopped: protocol completed After recovery IV was: Discontinued via X-ray tech and No redness or edema Patient to return to Nuc. Med at : 12:15 Patient discharged: Home Patient's Condition upon discharge was: stable Comments: During test BP 162/63 & HR 96 .  Recovery BP 138/63 & HR 81 .  Symptoms resolved in recovery. Geanie Cooley T

## 2014-01-08 ENCOUNTER — Telehealth: Payer: Self-pay | Admitting: *Deleted

## 2014-01-08 NOTE — Telephone Encounter (Signed)
-----   Message from Satira Sark, MD sent at 01/07/2014  7:39 AM EST ----- Reviewed. Please let her know that the stress test was normal. This would suggest stability in her coronary anatomy. We should continue medical therapy for now.

## 2014-01-08 NOTE — Telephone Encounter (Signed)
Patient informed. 

## 2014-06-29 ENCOUNTER — Encounter: Payer: Self-pay | Admitting: Cardiology

## 2014-06-29 ENCOUNTER — Ambulatory Visit (INDEPENDENT_AMBULATORY_CARE_PROVIDER_SITE_OTHER): Payer: Medicare HMO | Admitting: Cardiology

## 2014-06-29 VITALS — BP 145/74 | HR 78 | Ht 65.0 in | Wt 190.4 lb

## 2014-06-29 DIAGNOSIS — I10 Essential (primary) hypertension: Secondary | ICD-10-CM | POA: Diagnosis not present

## 2014-06-29 NOTE — Patient Instructions (Signed)
Your physician recommends that you continue on your current medications as directed. Please refer to the Current Medication list given to you today. Your physician recommends that you schedule a follow-up appointment in: 6 months. You will receive a reminder letter in the mail in about 4 months reminding you to call and schedule your appointment. If you don't receive this letter, please contact our office. 

## 2014-06-29 NOTE — Progress Notes (Signed)
Cardiology Office Note  Date: 06/29/2014   ID: [name], DOB 10-05-1940, MRN [id number]  PCP: Octavio Graves, DO  Primary Cardiologist: Rozann Lesches, MD   Chief Complaint  Patient presents with  . Coronary Artery Disease  . Hypertension    History of Present Illness: [name] is a 73 y.o. female last seen in November 2015. She presents for a routine visit. Reports that she is working in her garden, sometimes feels palpitations and shortness of breath with activity, no progressing symptoms or syncope however. We did follow up with testing late last year, outlined below and reassuring overall.  Cardiac regimen is stable. She is due for follow-up lab work and physical with Dr. Melina Copa soon.  ECG today shows normal sinus rhythm.   Past Medical History  Diagnosis Date  . Arthritis   . Glaucoma   . Herniated disc   . Essential hypertension, benign   . Dyslipidemia   . Type 2 diabetes mellitus     Borderline  . Coronary atherosclerosis of native coronary artery     DES LAD 1/10    History reviewed. No pertinent past surgical history.  Current Outpatient Prescriptions  Medication Sig Dispense Refill  . Ascorbic Acid (VITAMIN C) 500 MG tablet Take 500 mg by mouth daily.      Marland Kitchen aspirin 325 MG tablet Take 325 mg by mouth daily.      . bimatoprost (LUMIGAN) 0.01 % SOLN Place 1 drop into both eyes at bedtime.    . Calcium Carbonate Antacid (TUMS PO) Take 2 tablets by mouth as needed.     . Cholecalciferol (VITAMIN D) 400 UNITS capsule Take 400 Units by mouth daily.      . Cyanocobalamin (VITAMIN B 12 PO) Take 1 tablet by mouth daily.    . Garlic 097 MG TABS Take 1 tablet by mouth daily.      Marland Kitchen HYDROcodone-acetaminophen (NORCO/VICODIN) 5-325 MG per tablet Take 1 tablet by mouth every 6 (six) hours as needed for pain.    Marland Kitchen MAGNESIUM CARBONATE PO Take 1 tablet by mouth daily.    . meloxicam (MOBIC) 7.5 MG tablet Take 7.5 mg by mouth daily.    . metoprolol tartrate  (LOPRESSOR) 25 MG tablet Take 1 tablet (25 mg total) by mouth 2 (two) times daily. 180 tablet 3  . nitroGLYCERIN (NITROSTAT) 0.4 MG SL tablet Place 0.4 mg under the tongue every 5 (five) minutes as needed for chest pain.    . Omega-3 Fatty Acids (FISH OIL) 1000 MG CAPS Take 1 capsule by mouth 2 (two) times daily.      . potassium chloride (K-DUR) 10 MEQ tablet Take 1 tablet (10 mEq total) by mouth daily. 90 tablet 3  . simvastatin (ZOCOR) 40 MG tablet Take 40 mg by mouth at bedtime.      . traZODone (DESYREL) 50 MG tablet Take 1 tablet (50 mg total) by mouth at bedtime. 30 tablet 0  . triamterene-hydrochlorothiazide (MAXZIDE-25) 37.5-25 MG per tablet Take 0.5 tablets by mouth daily.    Marland Kitchen zolpidem (AMBIEN) 10 MG tablet Take 10 mg by mouth at bedtime as needed for sleep.     No current facility-administered medications for this visit.    Allergies:  Review of patient's allergies indicates no known allergies.   Social History: The patient  reports that she has never smoked. She has never used smokeless tobacco. She reports that she does not drink alcohol or use illicit drugs.   ROS:  Please see the history of present illness. Otherwise, complete review of systems is positive for leg cramps intermittently.  All other systems are reviewed and negative.   Physical Exam: VS:  BP 145/74 mmHg  Pulse 78  Ht 5\' 5"  (1.651 m)  Wt 190 lb 6.4 oz (86.365 kg)  BMI 31.68 kg/m2  SpO2 98%, BMI Body mass index is 31.68 kg/(m^2).  Wt Readings from Last 3 Encounters:  06/29/14 190 lb 6.4 oz (86.365 kg)  12/23/13 187 lb 12.8 oz (85.186 kg)  06/17/13 162 lb 1.9 oz (73.537 kg)     No acute distress.  HEENT: Conjunctiva and lids normal, oropharynx clear.  Neck: Supple, no elevated JVP or carotid bruits, no thyromegaly.  Lungs: Clear to auscultation, nonlabored breathing at rest.  Cardiac: Regular rate and rhythm, no S3 or significant systolic murmur, no pericardial rub.  Abdomen: Soft, nontender,  bowel sounds present.  Extremities: No pitting edema, distal pulses 2+.  Skin: Warm and dry.  Musculoskeletal: No kyphosis.  Neuropsychiatric: Alert and oriented x3, affect grossly appropriate.   ECG: ECG is ordered today and reviewed showing normal sinus rhythm.   Recent Labwork:  11/18/2013: BUN 14, creatinine 0.8, AST 20, ALT 18, triglycerides 86, cholesterol 183, HDL 49, LDL 117, TSH 1.4, hemoglobin 13.1, platelets 165  Other Studies Reviewed Today:  1. Echocardiogram from October 2014 demonstrated mild LVH with LVEF 38-25%, grade 1 diastolic dysfunction, MAC with trivial mitral regurgitation, trivial tricuspid regurgitation with PASP 20 mm mercury, pericardial fat pad noted.  2. Lexiscan Cardiolite 01/06/2014: FINDINGS: ECG/stress data: The patient was stressed according to the Lexiscan protocol. The heart rate ranged from 68 up to 96 beats per min. The blood pressure range from 145/63 up to 162/63. [name] was reported.  Baseline tracing demonstrated normal sinus rhythm. With stress, there were mild nonspecific ST segment changes. No arrhythmias were noted.  Perfusion: No decreased activity in the left ventricle on stress imaging to suggest reversible ischemia or infarction.  Wall Motion: Normal left ventricular wall motion. No left ventricular dilation.  Left Ventricular Ejection Fraction: 64 %  End diastolic volume 64 ml  End systolic volume 23 ml  IMPRESSION: 1. No reversible ischemia or infarction.  2. Normal left ventricular wall motion.  3. Left ventricular ejection fraction 64%  4. Low-risk stress test findings*.   Assessment and Plan:  1. CAD status post DES to the LAD in 2010, negative ischemic workup in the last 6 months. ECG is normal today. Continue medical therapy and observation.  2. Hyperlipidemia, on Zocor and omega-3 supplements. Keep follow-up with Dr. Melina Copa for lab work.  3. Essential hypertension, no change in  current regimen.  Current medicines were reviewed with the patient today.   Orders Placed This Encounter  Procedures  . EKG 12-Lead    Disposition: FU with me in 6 months.   Signed, Satira Sark, MD, [facility] 06/29/2014 12:13 PM    [facility] [address], [place], [place] [address] Phone: [phone]; Fax: [phone]

## 2015-01-05 ENCOUNTER — Encounter: Payer: Self-pay | Admitting: Cardiology

## 2015-01-05 ENCOUNTER — Ambulatory Visit (INDEPENDENT_AMBULATORY_CARE_PROVIDER_SITE_OTHER): Payer: Medicare HMO | Admitting: Cardiology

## 2015-01-05 VITALS — BP 122/70 | HR 68 | Ht 65.0 in | Wt 186.0 lb

## 2015-01-05 DIAGNOSIS — E1159 Type 2 diabetes mellitus with other circulatory complications: Secondary | ICD-10-CM | POA: Diagnosis not present

## 2015-01-05 DIAGNOSIS — I25119 Atherosclerotic heart disease of native coronary artery with unspecified angina pectoris: Secondary | ICD-10-CM | POA: Diagnosis not present

## 2015-01-05 DIAGNOSIS — E782 Mixed hyperlipidemia: Secondary | ICD-10-CM | POA: Diagnosis not present

## 2015-01-05 DIAGNOSIS — I10 Essential (primary) hypertension: Secondary | ICD-10-CM

## 2015-01-05 MED ORDER — ISOSORBIDE MONONITRATE ER 30 MG PO TB24: 30.0000 mg | ORAL_TABLET | Freq: Every evening | ORAL | Status: DC

## 2015-01-05 NOTE — Progress Notes (Signed)
Cardiology Office Note  Date: 01/05/2015   [name], [name], MRN [id number]  PCP: Octavio Graves, DO  Primary Cardiologist: Rozann Lesches, MD   Chief Complaint  Patient presents with  . Coronary Artery Disease    History of Present Illness: [name] is a 74 y.o. female last seen in May. She presents for a follow-up cardiac visit. Within the last several weeks she has been experiencing recurrent neck and chest discomfort, sometimes headaches. This tends to occur when she is emotionally upset, also if she "rushes" to get somewhere. She has associated shortness of breath with exertional episodes. She has taken nitroglycerin occasionally with some improvement.  Her last stress test was in November 2015, low risk without active ischemic territories. She reports compliance with her medications which we reviewed and are outlined below.  Otherwise she reports no major health changes. Has had some recent "cold" symptoms with intermittent cough, no fevers or chills. She has had no orthopnea or PND, no palpitations or syncope.  She continues on statin therapy, most recent LDL was 108 per lab work in May.  Blood pressure today is well-controlled.   Past Medical History  Diagnosis Date  . Arthritis   . Glaucoma   . Herniated disc   . Essential hypertension, benign   . Dyslipidemia   . Type 2 diabetes mellitus (HCC)     Borderline  . Coronary atherosclerosis of native coronary artery     DES LAD 1/10    History reviewed. No pertinent past surgical history.  Current Outpatient Prescriptions  Medication Sig Dispense Refill  . Ascorbic Acid (VITAMIN C) 500 MG tablet Take 500 mg by mouth daily.      Marland Kitchen aspirin 325 MG tablet Take 325 mg by mouth daily.      . bimatoprost (LUMIGAN) 0.01 % SOLN Place 1 drop into both eyes at bedtime.    . Calcium Carbonate Antacid (TUMS PO) Take 2 tablets by mouth as needed.     . Cholecalciferol (VITAMIN D) 400 UNITS capsule  Take 400 Units by mouth daily.      . Cyanocobalamin (VITAMIN B 12 PO) Take 1 tablet by mouth daily.    . Garlic 123XX123 MG TABS Take 1 tablet by mouth daily.      Marland Kitchen HYDROcodone-acetaminophen (NORCO/VICODIN) 5-325 MG per tablet Take 1 tablet by mouth every 6 (six) hours as needed for pain.    Marland Kitchen MAGNESIUM CARBONATE PO Take 1 tablet by mouth daily.    . meloxicam (MOBIC) 7.5 MG tablet Take 7.5 mg by mouth daily.    . metoprolol tartrate (LOPRESSOR) 25 MG tablet Take 1 tablet (25 mg total) by mouth 2 (two) times daily. 180 tablet 3  . nitroGLYCERIN (NITROSTAT) 0.4 MG SL tablet Place 0.4 mg under the tongue every 5 (five) minutes as needed for chest pain.    . Omega-3 Fatty Acids (FISH OIL) 1000 MG CAPS Take 1 capsule by mouth 2 (two) times daily.      . potassium chloride (K-DUR) 10 MEQ tablet Take 1 tablet (10 mEq total) by mouth daily. 90 tablet 3  . simvastatin (ZOCOR) 40 MG tablet Take 40 mg by mouth at bedtime.      . traZODone (DESYREL) 50 MG tablet Take 1 tablet (50 mg total) by mouth at bedtime. 30 tablet 0  . triamterene-hydrochlorothiazide (MAXZIDE-25) 37.5-25 MG per tablet Take 0.5 tablets by mouth daily.    Marland Kitchen zolpidem (AMBIEN) 10 MG tablet Take 10  mg by mouth at bedtime as needed for sleep.    . isosorbide mononitrate (IMDUR) 30 MG 24 hr tablet Take 1 tablet (30 mg total) by mouth every evening. 30 tablet 2   No current facility-administered medications for this visit.    Allergies:  Review of patient's allergies indicates no known allergies.   Social History: The patient  reports that she has never smoked. She has never used smokeless tobacco. She reports that she does not drink alcohol or use illicit drugs.   ROS:  Please see the history of present illness. Otherwise, complete review of systems is positive for none.  All other systems are reviewed and negative.   Physical Exam: VS:  BP 122/70 mmHg  Pulse 68  Ht 5\' 5"  (1.651 m)  Wt 186 lb (84.369 kg)  BMI 30.95 kg/m2  SpO2 95%,  BMI Body mass index is 30.95 kg/(m^2).  Wt Readings from Last 3 Encounters:  01/05/15 186 lb (84.369 kg)  06/29/14 190 lb 6.4 oz (86.365 kg)  12/23/13 187 lb 12.8 oz (85.186 kg)     Gen.: Patient appears comfortable at rest.  HEENT: Conjunctiva and lids normal, oropharynx clear.  Neck: Supple, no elevated JVP or carotid bruits, no thyromegaly.  Lungs: Clear to auscultation, nonlabored breathing at rest.  Cardiac: Regular rate and rhythm, no S3 or significant systolic murmur, no pericardial rub.  Abdomen: Soft, nontender, bowel sounds present.  Extremities: No pitting edema, distal pulses 2+.  Skin: Warm and dry.  Musculoskeletal: No kyphosis.  Neuropsychiatric: Alert and oriented x3, affect grossly appropriate.   ECG: Tracing from 06/29/2014 showed normal sinus rhythm.   Recent Labwork:  May 2016: BUN 12, creatinine 0.7, AST 21, ALT 22, potassium 4.7, hemoglobin A1c 6.5, triglycerides 161, cholesterol 181, HDL 41, LDL 108.  Other Studies Reviewed Today:  1. Echocardiogram from October 2014 demonstrated mild LVH with LVEF Q000111Q, grade 1 diastolic dysfunction, MAC with trivial mitral regurgitation, trivial tricuspid regurgitation with PASP 20 mm mercury, pericardial fat pad noted.  2. Lexiscan Cardiolite 01/06/2014: FINDINGS: ECG/stress data: The patient was stressed according to the Lexiscan protocol. The heart rate ranged from 68 up to 96 beats per min. The blood pressure range from 145/63 up to 162/63. [name] was reported.  Baseline tracing demonstrated normal sinus rhythm. With stress, there were mild nonspecific ST segment changes. No arrhythmias were noted.  Perfusion: No decreased activity in the left ventricle on stress imaging to suggest reversible ischemia or infarction.  Wall Motion: Normal left ventricular wall motion. No left ventricular dilation.  Left Ventricular Ejection Fraction: 64 %  End diastolic volume 64 ml  End systolic  volume 23 ml  IMPRESSION: 1. No reversible ischemia or infarction.  2. Normal left ventricular wall motion.  3. Left ventricular ejection fraction 64%  4. Low-risk stress test findings*.   Assessment and Plan:  1. Symptoms suggestive of recurrent angina, typical and atypical features. She has a history of DES to the LAD in 2010, low risk ischemic workup one year ago however. We will try Imdur 30 mg daily and have her follow-up to review symptoms in the next month. If symptoms escalate suddenly, either in frequency or intensity, I asked her to seek urgent medical attention and evaluation in the ER.  2. Essential hypertension, blood pressure is well controlled today.  3. Hyperlipidemia, continues on Zocor. Most recent LDL 108. Will discuss further at next visit whether we need to consider switching to a different statin with more potency.  Would prefer this to advancing Zocor dose to 80 mg daily.  4. Type 2 diabetes mellitus, followed by Dr. Melina Copa.  Current medicines were reviewed with the patient today.  Disposition: FU with me in 1 month.   Signed, Satira Sark, MD, [facility] 01/05/2015 10:37 AM    [place] at [place], [place],  [address] Phone: [phone]; Fax: [phone]

## 2015-01-05 NOTE — Patient Instructions (Signed)
Your physician has recommended you make the following change in your medication:  Start imdur (isosorbide mononitrate) 30 mg daily in the evening. Continue all other medications the same. Your physician recommends that you schedule a follow-up appointment in: 1 month.

## 2015-01-07 ENCOUNTER — Encounter (HOSPITAL_COMMUNITY): Payer: Self-pay

## 2015-02-05 ENCOUNTER — Ambulatory Visit: Payer: Medicare HMO | Admitting: Cardiology

## 2015-02-18 ENCOUNTER — Ambulatory Visit (INDEPENDENT_AMBULATORY_CARE_PROVIDER_SITE_OTHER): Payer: Medicare HMO | Admitting: Cardiology

## 2015-02-18 ENCOUNTER — Other Ambulatory Visit: Payer: Self-pay | Admitting: Cardiology

## 2015-02-18 ENCOUNTER — Encounter: Payer: Self-pay | Admitting: *Deleted

## 2015-02-18 ENCOUNTER — Encounter: Payer: Self-pay | Admitting: Cardiology

## 2015-02-18 ENCOUNTER — Telehealth: Payer: Self-pay | Admitting: Cardiology

## 2015-02-18 VITALS — BP 130/62 | HR 74 | Ht 64.0 in | Wt 184.0 lb

## 2015-02-18 DIAGNOSIS — I200 Unstable angina: Secondary | ICD-10-CM

## 2015-02-18 DIAGNOSIS — I25119 Atherosclerotic heart disease of native coronary artery with unspecified angina pectoris: Secondary | ICD-10-CM

## 2015-02-18 DIAGNOSIS — E782 Mixed hyperlipidemia: Secondary | ICD-10-CM

## 2015-02-18 DIAGNOSIS — I10 Essential (primary) hypertension: Secondary | ICD-10-CM

## 2015-02-18 NOTE — Telephone Encounter (Signed)
12/29 CLINICALS FAXED

## 2015-02-18 NOTE — Telephone Encounter (Signed)
Left heart cath - Tuesday, 02/23/15 - 9:00 - [name]  Checking percert

## 2015-02-18 NOTE — Progress Notes (Signed)
Cardiology Office Note  Date: 02/18/2015   ID: [name], DOB Nov 12, 1940, MRN [id number]  PCP: Julie Graves, DO  Primary Cardiologist: Julie Lesches, MD   Chief Complaint  Patient presents with  . Coronary Artery Disease    History of Present Illness: [name] is a 74 y.o. female last seen in November. We added Imdur to her medical regimen at that time for treatment of angina symptoms. She had last undergone ischemic testing in November 2015 which was low risk. She returns today to go over her symptoms, states that she continues to have chest tightness, particularly when she is stressed or "overdoes it." She feels short of breath with these symptoms and also lightheaded. She states that this reminds her of how she felt prior to her original stent placement back in 2010.  She reports compliance with her medications which are outlined below. The addition of Imdur has helped "a little." Blood pressure is reasonably well controlled.  She has a history of DES to the LAD in 2010, Cardiolite study from November 2015 demonstrated no ischemia with LVEF 64%.day we discussed follow-up ischemic testing in light of her continued symptoms. After reviewing the risks and benefits, plan is to proceed with a diagnostic cardiac catheterization to most clearly evaluate her coronary anatomy and determine if any revascularization options now need to be considered.  Past Medical History  Diagnosis Date  . Arthritis   . Glaucoma   . Herniated disc   . Essential hypertension, benign   . Dyslipidemia   . Type 2 diabetes mellitus (HCC)     Borderline  . Coronary atherosclerosis of native coronary artery     DES LAD 1/10    History reviewed. No pertinent past surgical history.  Current Outpatient Prescriptions  Medication Sig Dispense Refill  . Ascorbic Acid (VITAMIN C) 500 MG tablet Take 500 mg by mouth daily.      Marland Kitchen aspirin 325 MG tablet Take 325 mg by mouth daily.      . bimatoprost  (LUMIGAN) 0.01 % SOLN Place 1 drop into both eyes at bedtime.    . Calcium Carbonate Antacid (TUMS PO) Take 2 tablets by mouth as needed.     . Cholecalciferol (VITAMIN D) 400 UNITS capsule Take 400 Units by mouth daily.      . Cyanocobalamin (VITAMIN B 12 PO) Take 1 tablet by mouth daily.    . Garlic 123XX123 MG TABS Take 1 tablet by mouth daily.      Marland Kitchen HYDROcodone-acetaminophen (NORCO/VICODIN) 5-325 MG per tablet Take 1 tablet by mouth every 6 (six) hours as needed for pain.    . isosorbide mononitrate (IMDUR) 30 MG 24 hr tablet Take 1 tablet (30 mg total) by mouth every evening. 30 tablet 2  . MAGNESIUM CARBONATE PO Take 1 tablet by mouth daily.    . meloxicam (MOBIC) 7.5 MG tablet Take 7.5 mg by mouth daily.    . metoprolol tartrate (LOPRESSOR) 25 MG tablet Take 1 tablet (25 mg total) by mouth 2 (two) times daily. 180 tablet 3  . nitroGLYCERIN (NITROSTAT) 0.4 MG SL tablet Place 0.4 mg under the tongue every 5 (five) minutes as needed for chest pain.    . Omega-3 Fatty Acids (FISH OIL) 1000 MG CAPS Take 1 capsule by mouth 2 (two) times daily.      . potassium chloride (K-DUR) 10 MEQ tablet Take 1 tablet (10 mEq total) by mouth daily. 90 tablet 3  . simvastatin (ZOCOR) 40  MG tablet Take 40 mg by mouth at bedtime.      . traZODone (DESYREL) 50 MG tablet Take 1 tablet (50 mg total) by mouth at bedtime. 30 tablet 0  . triamterene-hydrochlorothiazide (MAXZIDE-25) 37.5-25 MG per tablet Take 0.5 tablets by mouth daily.    Marland Kitchen zolpidem (AMBIEN) 10 MG tablet Take 10 mg by mouth at bedtime as needed for sleep.     No current facility-administered medications for this visit.   Allergies:  Review of patient's allergies indicates no known allergies.   Social History: The patient  reports that she has never smoked. She has never used smokeless tobacco. She reports that she does not drink alcohol or use illicit drugs.   ROS:  Please see the history of present illness. Otherwise, complete review of systems is  positive for emotional stress.  All other systems are reviewed and negative.   Physical Exam: VS:  BP 130/62 mmHg  Pulse 74  Ht 5\' 4"  (1.626 m)  Wt 184 lb (83.462 kg)  BMI 31.57 kg/m2  SpO2 96%, BMI Body mass index is 31.57 kg/(m^2).  Wt Readings from Last 3 Encounters:  02/18/15 184 lb (83.462 kg)  01/05/15 186 lb (84.369 kg)  06/29/14 190 lb 6.4 oz (86.365 kg)    Gen.: Patient appears comfortable at rest.  HEENT: Conjunctiva and lids normal, oropharynx clear.  Neck: Supple, no elevated JVP or carotid bruits, no thyromegaly.  Lungs: Clear to auscultation, nonlabored breathing at rest.  Cardiac: Regular rate and rhythm, no S3 or significant systolic murmur, no pericardial rub.  Abdomen: Soft, nontender, bowel sounds present.  Extremities: No pitting edema, distal pulses 2+.  Skin: Warm and dry.  Musculoskeletal: No kyphosis.  Neuropsychiatric: Alert and oriented x3, affect grossly appropriate.  ECG: Tracing from 06/29/2014 showed normal sinus rhythm.  Recent Labwork:  May 2016: BUN 12, creatinine 0.7, AST 21, ALT 22, potassium 4.7, hemoglobin A1c 6.5, triglycerides 161, cholesterol 181, HDL 41, LDL 108  Other Studies Reviewed Today:  1. Echocardiogram from October 2014 demonstrated mild LVH with LVEF Q000111Q, grade 1 diastolic dysfunction, MAC with trivial mitral regurgitation, trivial tricuspid regurgitation with PASP 20 mm mercury, pericardial fat pad noted.  2. Lexiscan Cardiolite 01/06/2014: FINDINGS: ECG/stress data: The patient was stressed according to the Lexiscan protocol. The heart rate ranged from 68 up to 96 beats per min. The blood pressure range from 145/63 up to 162/63. [name] was reported.  Baseline tracing demonstrated normal sinus rhythm. With stress, there were mild nonspecific ST segment changes. No arrhythmias were noted.  Perfusion: No decreased activity in the left ventricle on stress imaging to suggest reversible ischemia or  infarction.  Wall Motion: Normal left ventricular wall motion. No left ventricular dilation.  Left Ventricular Ejection Fraction: 64 %  End diastolic volume 64 ml  End systolic volume 23 ml  IMPRESSION: 1. No reversible ischemia or infarction.  2. Normal left ventricular wall motion.  3. Left ventricular ejection fraction 64%  4. Low-risk stress test findings*.  Assessment and Plan:  1. Accelerating angina symptoms on medical therapy. Imdur has been not as helpful as hoped. As noted above plan is to proceed with a diagnostic cardiac catheterization to most clearly evaluate her coronary anatomy and determine if any revascularization options now need to be considered. This will be scheduled at her convenience.  2. CAD status post DES to the LAD in 2010. Follow-up angiography is planned as outlined. Continue aspirin, Imdur, Lopressor, and Zocor.  3. Essential hypertension.  Also on Maxzide and KCl. Recently well controlled.  4. Hyperlipidemia on statin therapy.  5. Situational stress at home. Could also be some component to her symptoms as well.  Current medicines were reviewed with the patient today.  Disposition: FU with me after cardiac catheterization.   Signed, Satira Sark, MD, [facility] 02/18/2015 9:18 AM    [place] at [place], [place], [place] [address] Phone: [phone]; Fax: [phone]

## 2015-02-18 NOTE — Patient Instructions (Signed)
Your physician has requested that you have a cardiac catheterization. Cardiac catheterization is used to diagnose and/or treat various heart conditions. Doctors may recommend this procedure for a number of different reasons. The most common reason is to evaluate chest pain. Chest pain can be a symptom of coronary artery disease (CAD), and cardiac catheterization can show whether plaque is narrowing or blocking your heart's arteries. This procedure is also used to evaluate the valves, as well as measure the blood flow and oxygen levels in different parts of your heart. For further information please visit [email or web address]. Please follow instruction sheet, as given. Continue all current medications. Follow up will be given at time of discharge.   

## 2015-02-19 NOTE — Telephone Encounter (Signed)
authorized

## 2015-02-23 ENCOUNTER — Encounter (HOSPITAL_COMMUNITY): Payer: Self-pay | Admitting: General Practice

## 2015-02-23 ENCOUNTER — Encounter (HOSPITAL_COMMUNITY)
Admission: RE | Disposition: A | Discharge status: Home / Self Care | Payer: Self-pay | Source: Ambulatory Visit | Attending: Cardiovascular Disease

## 2015-02-23 ENCOUNTER — Ambulatory Visit (HOSPITAL_COMMUNITY)
Admission: RE | Admit: 2015-02-23 | Discharge: 2015-02-24 | Disposition: A | Discharge status: Home / Self Care | Payer: Medicare HMO | Source: Ambulatory Visit | Attending: Cardiovascular Disease | Admitting: Cardiovascular Disease

## 2015-02-23 ENCOUNTER — Ambulatory Visit (HOSPITAL_BASED_OUTPATIENT_CLINIC_OR_DEPARTMENT_OTHER): Payer: Medicare HMO

## 2015-02-23 DIAGNOSIS — I2089 Other forms of angina pectoris: Secondary | ICD-10-CM | POA: Diagnosis present

## 2015-02-23 DIAGNOSIS — Z955 Presence of coronary angioplasty implant and graft: Secondary | ICD-10-CM | POA: Insufficient documentation

## 2015-02-23 DIAGNOSIS — M1990 Unspecified osteoarthritis, unspecified site: Secondary | ICD-10-CM | POA: Diagnosis not present

## 2015-02-23 DIAGNOSIS — R079 Chest pain, unspecified: Secondary | ICD-10-CM

## 2015-02-23 DIAGNOSIS — H409 Unspecified glaucoma: Secondary | ICD-10-CM | POA: Diagnosis not present

## 2015-02-23 DIAGNOSIS — I208 Other forms of angina pectoris: Secondary | ICD-10-CM | POA: Diagnosis present

## 2015-02-23 DIAGNOSIS — I10 Essential (primary) hypertension: Secondary | ICD-10-CM | POA: Diagnosis not present

## 2015-02-23 DIAGNOSIS — E785 Hyperlipidemia, unspecified: Secondary | ICD-10-CM | POA: Diagnosis not present

## 2015-02-23 DIAGNOSIS — I25119 Atherosclerotic heart disease of native coronary artery with unspecified angina pectoris: Secondary | ICD-10-CM | POA: Diagnosis present

## 2015-02-23 DIAGNOSIS — E119 Type 2 diabetes mellitus without complications: Secondary | ICD-10-CM | POA: Diagnosis not present

## 2015-02-23 DIAGNOSIS — Z7982 Long term (current) use of aspirin: Secondary | ICD-10-CM | POA: Diagnosis not present

## 2015-02-23 DIAGNOSIS — I25118 Atherosclerotic heart disease of native coronary artery with other forms of angina pectoris: Secondary | ICD-10-CM | POA: Diagnosis not present

## 2015-02-23 DIAGNOSIS — I200 Unstable angina: Secondary | ICD-10-CM

## 2015-02-23 HISTORY — DX: Other chronic pain: G89.29

## 2015-02-23 HISTORY — DX: Procedure and treatment not carried out because of patient's decision for reasons of belief and group pressure: Z53.1

## 2015-02-23 HISTORY — DX: Major depressive disorder, single episode, unspecified: F32.9

## 2015-02-23 HISTORY — PX: CORONARY ANGIOPLASTY: SHX604

## 2015-02-23 HISTORY — DX: Other intervertebral disc displacement, lumbar region: M51.26

## 2015-02-23 HISTORY — DX: Low back pain, unspecified: M54.50

## 2015-02-23 HISTORY — DX: Other intervertebral disc degeneration, lumbar region without mention of lumbar back pain or lower extremity pain: M51.369

## 2015-02-23 HISTORY — DX: Low back pain: M54.5

## 2015-02-23 HISTORY — DX: Depression, unspecified: F32.A

## 2015-02-23 HISTORY — DX: Other intervertebral disc degeneration, lumbar region: M51.36

## 2015-02-23 HISTORY — PX: CARDIAC CATHETERIZATION: SHX172

## 2015-02-23 HISTORY — DX: Reserved for inherently not codable concepts without codable children: IMO0001

## 2015-02-23 LAB — BASIC METABOLIC PANEL
ANION GAP: 12 (ref 5–15)
BUN: 13 mg/dL (ref 6–20)
CALCIUM: 9.9 mg/dL (ref 8.9–10.3)
CO2: 25 mmol/L (ref 22–32)
CREATININE: 0.75 mg/dL (ref 0.44–1.00)
Chloride: 103 mmol/L (ref 101–111)
GFR calc Af Amer: >60 mL/min (ref >60)
GLUCOSE: 137 mg/dL — AB (ref 65–99)
Potassium: 4.2 mmol/L (ref 3.5–5.1)
Sodium: 140 mmol/L (ref 135–145)

## 2015-02-23 LAB — CBC
HCT: 38.7 % (ref 36.0–46.0)
Hemoglobin: 13.2 g/dL (ref 12.0–15.0)
MCH: 29.6 pg (ref 26.0–34.0)
MCHC: 34.1 g/dL (ref 30.0–36.0)
MCV: 86.8 fL (ref 78.0–100.0)
PLATELETS: 209 10*3/uL (ref 150–400)
RBC: 4.46 MIL/uL (ref 3.87–5.11)
RDW: 12.4 % (ref 11.5–15.5)
WBC: 6.1 10*3/uL (ref 4.0–10.5)

## 2015-02-23 LAB — PROTIME-INR
INR: 0.92 (ref 0.00–1.49)
PROTHROMBIN TIME: 12.6 s (ref 11.6–15.2)

## 2015-02-23 LAB — GLUCOSE, CAPILLARY
Glucose-Capillary: 146 mg/dL — ABNORMAL HIGH (ref 65–99)
Glucose-Capillary: 102 mg/dL — ABNORMAL HIGH (ref 65–99)
Glucose-Capillary: 127 mg/dL — ABNORMAL HIGH (ref 65–99)

## 2015-02-23 LAB — POCT ACTIVATED CLOTTING TIME: Activated Clotting Time: 260 seconds

## 2015-02-23 LAB — NO BLOOD PRODUCTS

## 2015-02-23 SURGERY — LEFT HEART CATH AND CORONARY ANGIOGRAPHY

## 2015-02-23 MED ORDER — HEPARIN (PORCINE) IN NACL 2-0.9 UNIT/ML-% IJ SOLN
INTRAMUSCULAR | Status: DC | PRN
  Administered 2015-02-23: 09:00:00

## 2015-02-23 MED ORDER — SIMVASTATIN 20 MG PO TABS
40.0000 mg | ORAL_TABLET | Freq: Every day | ORAL | Status: DC
  Administered 2015-02-23: 21:00:00 40 mg via ORAL
  Filled 2015-02-23: qty 2

## 2015-02-23 MED ORDER — HEPARIN (PORCINE) IN NACL 2-0.9 UNIT/ML-% IJ SOLN
INTRAMUSCULAR | Status: AC
  Filled 2015-02-23: qty 500

## 2015-02-23 MED ORDER — TICAGRELOR 90 MG PO TABS
ORAL_TABLET | ORAL | Status: AC
  Filled 2015-02-23: qty 1

## 2015-02-23 MED ORDER — DIAZEPAM 5 MG PO TABS: 5.0000 mg | ORAL_TABLET | Freq: Three times a day (TID) | ORAL | Status: DC | PRN

## 2015-02-23 MED ORDER — TRIAMTERENE-HCTZ 37.5-25 MG PO TABS
0.5000 | ORAL_TABLET | Freq: Every day | ORAL | Status: DC
  Administered 2015-02-24: 0.5 via ORAL
  Filled 2015-02-23: qty 0.5

## 2015-02-23 MED ORDER — MIDAZOLAM HCL 2 MG/2ML IJ SOLN
INTRAMUSCULAR | Status: AC
  Filled 2015-02-23: qty 2

## 2015-02-23 MED ORDER — ASPIRIN 81 MG PO CHEW
CHEWABLE_TABLET | ORAL | Status: AC
  Filled 2015-02-23: qty 1

## 2015-02-23 MED ORDER — HEPARIN SODIUM (PORCINE) 1000 UNIT/ML IJ SOLN
INTRAMUSCULAR | Status: DC | PRN
  Administered 2015-02-23: 5000 [IU] via INTRAVENOUS
  Administered 2015-02-23: 3000 [IU] via INTRAVENOUS

## 2015-02-23 MED ORDER — POTASSIUM CHLORIDE ER 10 MEQ PO TBCR
10.0000 meq | EXTENDED_RELEASE_TABLET | Freq: Every day | ORAL | Status: DC
  Administered 2015-02-23 – 2015-02-24 (×2): 10 meq via ORAL
  Filled 2015-02-23 (×3): qty 1

## 2015-02-23 MED ORDER — ASPIRIN 81 MG PO CHEW
81.0000 mg | CHEWABLE_TABLET | Freq: Every day | ORAL | Status: DC
  Administered 2015-02-24: 10:00:00 81 mg via ORAL
  Filled 2015-02-23: qty 1

## 2015-02-23 MED ORDER — TICAGRELOR 90 MG PO TABS
ORAL_TABLET | ORAL | Status: DC | PRN
  Administered 2015-02-23: 180 mg via ORAL

## 2015-02-23 MED ORDER — HEPARIN SODIUM (PORCINE) 1000 UNIT/ML IJ SOLN
INTRAMUSCULAR | Status: AC
  Filled 2015-02-23: qty 1

## 2015-02-23 MED ORDER — FENTANYL CITRATE (PF) 100 MCG/2ML IJ SOLN
INTRAMUSCULAR | Status: DC | PRN
  Administered 2015-02-23: 25 ug via INTRAVENOUS
  Administered 2015-02-23: 50 ug via INTRAVENOUS

## 2015-02-23 MED ORDER — ONDANSETRON HCL 4 MG/2ML IJ SOLN: 4.0000 mg | Freq: Four times a day (QID) | INTRAMUSCULAR | Status: DC | PRN

## 2015-02-23 MED ORDER — FISH OIL 1000 MG PO CAPS
1.0000 | ORAL_CAPSULE | Freq: Two times a day (BID) | ORAL | Status: DC
Start: 2015-02-23 — End: 2015-02-23

## 2015-02-23 MED ORDER — METOPROLOL TARTRATE 25 MG PO TABS
25.0000 mg | ORAL_TABLET | Freq: Two times a day (BID) | ORAL | Status: DC
  Administered 2015-02-23 – 2015-02-24 (×2): 25 mg via ORAL
  Filled 2015-02-23 (×3): qty 1

## 2015-02-23 MED ORDER — SODIUM CHLORIDE 0.9 % IJ SOLN: 3.0000 mL | Freq: Two times a day (BID) | INTRAMUSCULAR | Status: DC

## 2015-02-23 MED ORDER — NITROGLYCERIN 0.4 MG SL SUBL: 0.4000 mg | SUBLINGUAL_TABLET | SUBLINGUAL | Status: DC | PRN

## 2015-02-23 MED ORDER — NITROGLYCERIN 1 MG/10 ML FOR IR/CATH LAB
INTRA_ARTERIAL | Status: DC | PRN
  Administered 2015-02-23: 150 ug via INTRACORONARY
  Administered 2015-02-23: 200 ug via INTRACORONARY

## 2015-02-23 MED ORDER — SODIUM CHLORIDE 0.9 % IV SOLN: 250.0000 mL | INTRAVENOUS | Status: DC | PRN

## 2015-02-23 MED ORDER — MIDAZOLAM HCL 2 MG/2ML IJ SOLN
INTRAMUSCULAR | Status: DC | PRN
  Administered 2015-02-23: 2 mg via INTRAVENOUS

## 2015-02-23 MED ORDER — VERAPAMIL HCL 2.5 MG/ML IV SOLN
INTRAVENOUS | Status: DC | PRN
  Administered 2015-02-23: 10 mL via INTRA_ARTERIAL

## 2015-02-23 MED ORDER — OMEGA-3-ACID ETHYL ESTERS 1 G PO CAPS
1.0000 g | ORAL_CAPSULE | Freq: Two times a day (BID) | ORAL | Status: DC
  Administered 2015-02-23 – 2015-02-24 (×2): 1 g via ORAL
  Filled 2015-02-23 (×3): qty 1

## 2015-02-23 MED ORDER — ZOLPIDEM TARTRATE 5 MG PO TABS: 10.0000 mg | ORAL_TABLET | Freq: Every evening | ORAL | Status: DC | PRN

## 2015-02-23 MED ORDER — ISOSORBIDE MONONITRATE ER 30 MG PO TB24
30.0000 mg | ORAL_TABLET | Freq: Every evening | ORAL | Status: DC
  Administered 2015-02-23: 18:00:00 30 mg via ORAL
  Filled 2015-02-23: qty 1

## 2015-02-23 MED ORDER — NITROGLYCERIN 1 MG/10 ML FOR IR/CATH LAB
INTRA_ARTERIAL | Status: AC
  Filled 2015-02-23: qty 10

## 2015-02-23 MED ORDER — MELOXICAM 7.5 MG PO TABS
7.5000 mg | ORAL_TABLET | Freq: Every day | ORAL | Status: DC
  Administered 2015-02-24: 10:00:00 7.5 mg via ORAL
  Filled 2015-02-23: qty 1

## 2015-02-23 MED ORDER — SODIUM CHLORIDE 0.9 % IV SOLN
INTRAVENOUS | Status: DC
  Administered 2015-02-23: 08:00:00 via INTRAVENOUS

## 2015-02-23 MED ORDER — LIVING WELL WITH DIABETES BOOK
Freq: Once | Status: AC
  Administered 2015-02-23: 21:00:00
  Filled 2015-02-23: qty 1

## 2015-02-23 MED ORDER — VERAPAMIL HCL 2.5 MG/ML IV SOLN
INTRAVENOUS | Status: AC
  Filled 2015-02-23: qty 2

## 2015-02-23 MED ORDER — ASPIRIN 81 MG PO CHEW
81.0000 mg | CHEWABLE_TABLET | ORAL | Status: AC
  Administered 2015-02-23: 81 mg via ORAL

## 2015-02-23 MED ORDER — TRAZODONE HCL 50 MG PO TABS
50.0000 mg | ORAL_TABLET | Freq: Every day | ORAL | Status: DC
  Administered 2015-02-23: 21:00:00 50 mg via ORAL
  Filled 2015-02-23: qty 1

## 2015-02-23 MED ORDER — IOHEXOL 350 MG/ML SOLN
INTRAVENOUS | Status: DC | PRN
  Administered 2015-02-23: 85 mL via INTRA_ARTERIAL

## 2015-02-23 MED ORDER — SODIUM CHLORIDE 0.9 % IV SOLN
INTRAVENOUS | Status: AC
Start: 2015-02-23 — End: 2015-02-23

## 2015-02-23 MED ORDER — ANGIOPLASTY BOOK
Freq: Once | Status: AC
  Administered 2015-02-23: 21:00:00
  Filled 2015-02-23: qty 1

## 2015-02-23 MED ORDER — LIDOCAINE HCL (PF) 1 % IJ SOLN
INTRAMUSCULAR | Status: AC
  Filled 2015-02-23: qty 30

## 2015-02-23 MED ORDER — FENTANYL CITRATE (PF) 100 MCG/2ML IJ SOLN
INTRAMUSCULAR | Status: AC
  Filled 2015-02-23: qty 2

## 2015-02-23 MED ORDER — ACETAMINOPHEN 325 MG PO TABS: 650.0000 mg | ORAL_TABLET | ORAL | Status: DC | PRN

## 2015-02-23 MED ORDER — SODIUM CHLORIDE 0.9 % IJ SOLN
3.0000 mL | Freq: Two times a day (BID) | INTRAMUSCULAR | Status: DC
  Administered 2015-02-23: 3 mL via INTRAVENOUS

## 2015-02-23 MED ORDER — LATANOPROST 0.005 % OP SOLN
1.0000 [drp] | Freq: Every day | OPHTHALMIC | Status: DC
  Administered 2015-02-23: 22:00:00 1 [drp] via OPHTHALMIC
  Filled 2015-02-23 (×2): qty 2.5

## 2015-02-23 MED ORDER — SODIUM CHLORIDE 0.9 % IJ SOLN: 3.0000 mL | INTRAMUSCULAR | Status: DC | PRN

## 2015-02-23 MED ORDER — TICAGRELOR 90 MG PO TABS
90.0000 mg | ORAL_TABLET | Freq: Two times a day (BID) | ORAL | Status: DC
  Administered 2015-02-23 – 2015-02-24 (×2): 90 mg via ORAL
  Filled 2015-02-23 (×2): qty 1

## 2015-02-23 MED ORDER — HYDROCODONE-ACETAMINOPHEN 5-325 MG PO TABS
1.0000 | ORAL_TABLET | Freq: Four times a day (QID) | ORAL | Status: DC | PRN
  Administered 2015-02-23: 20:00:00 1 via ORAL
  Filled 2015-02-23: qty 1

## 2015-02-23 MED ORDER — LIDOCAINE HCL (PF) 1 % IJ SOLN
INTRAMUSCULAR | Status: DC | PRN
  Administered 2015-02-23: 3 mL via SUBCUTANEOUS

## 2015-02-23 SURGICAL SUPPLY — 17 items
BALLN EMERGE MR 2.5X12 (BALLOONS) ×2
BALLN ~~LOC~~ EMERGE MR 3.25X15 (BALLOONS) ×2
BALLOON EMERGE MR 2.5X12 (BALLOONS) IMPLANT
BALLOON ~~LOC~~ EMERGE MR 3.25X15 (BALLOONS) IMPLANT
CATH INFINITI 5 FR JL3.5 (CATHETERS) ×2 IMPLANT
CATH INFINITI JR4 5F (CATHETERS) ×2 IMPLANT
CATH VISTA GUIDE 6FR XBLAD3.5 (CATHETERS) ×1 IMPLANT
DEVICE RAD COMP TR BAND LRG (VASCULAR PRODUCTS) ×2 IMPLANT
GLIDESHEATH SLEND SS 6F .021 (SHEATH) ×2 IMPLANT
KIT ENCORE 26 ADVANTAGE (KITS) ×1 IMPLANT
KIT HEART LEFT (KITS) ×2 IMPLANT
PACK CARDIAC CATHETERIZATION (CUSTOM PROCEDURE TRAY) ×2 IMPLANT
STENT PROMUS PREM MR 3.0X16 (Permanent Stent) ×1 IMPLANT
TRANSDUCER W/STOPCOCK (MISCELLANEOUS) ×2 IMPLANT
TUBING CIL FLEX 10 FLL-RA (TUBING) ×2 IMPLANT
WIRE COUGAR XT STRL 190CM (WIRE) ×1 IMPLANT
WIRE SAFE-T 1.5MM-J .035X260CM (WIRE) ×1 IMPLANT

## 2015-02-23 NOTE — Progress Notes (Signed)
TR BAND REMOVAL  LOCATION:    right radial  DEFLATED PER PROTOCOL:    Yes.    TIME BAND OFF / DRESSING APPLIED:    1345   SITE UPON ARRIVAL:    Level 0  SITE AFTER BAND REMOVAL:    Level 0  CIRCULATION SENSATION AND MOVEMENT:    Within Normal Limits   Yes.    COMMENTS:   Tolerated procedure well 

## 2015-02-23 NOTE — H&P (View-Only) (Signed)
Cardiology Office Note  Date: 02/18/2015   ID: [name], DOB Mar 02, 1940, MRN [id number]  PCP: Julie Graves, DO  Primary Cardiologist: Julie Lesches, MD   Chief Complaint  Patient presents with  . Coronary Artery Disease    History of Present Illness: [name] is a 75 y.o. female last seen in November. We added Imdur to her medical regimen at that time for treatment of angina symptoms. She had last undergone ischemic testing in November 2015 which was low risk. She returns today to go over her symptoms, states that she continues to have chest tightness, particularly when she is stressed or "overdoes it." She feels short of breath with these symptoms and also lightheaded. She states that this reminds her of how she felt prior to her original stent placement back in 2010.  She reports compliance with her medications which are outlined below. The addition of Imdur has helped "a little." Blood pressure is reasonably well controlled.  She has a history of DES to the LAD in 2010, Cardiolite study from November 2015 demonstrated no ischemia with LVEF 64%.day we discussed follow-up ischemic testing in light of her continued symptoms. After reviewing the risks and benefits, plan is to proceed with a diagnostic cardiac catheterization to most clearly evaluate her coronary anatomy and determine if any revascularization options now need to be considered.  Past Medical History  Diagnosis Date  . Arthritis   . Glaucoma   . Herniated disc   . Essential hypertension, benign   . Dyslipidemia   . Type 2 diabetes mellitus (HCC)     Borderline  . Coronary atherosclerosis of native coronary artery     DES LAD 1/10    History reviewed. No pertinent past surgical history.  Current Outpatient Prescriptions  Medication Sig Dispense Refill  . Ascorbic Acid (VITAMIN C) 500 MG tablet Take 500 mg by mouth daily.      Marland Kitchen aspirin 325 MG tablet Take 325 mg by mouth daily.      . bimatoprost  (LUMIGAN) 0.01 % SOLN Place 1 drop into both eyes at bedtime.    . Calcium Carbonate Antacid (TUMS PO) Take 2 tablets by mouth as needed.     . Cholecalciferol (VITAMIN D) 400 UNITS capsule Take 400 Units by mouth daily.      . Cyanocobalamin (VITAMIN B 12 PO) Take 1 tablet by mouth daily.    . Garlic 123XX123 MG TABS Take 1 tablet by mouth daily.      Marland Kitchen HYDROcodone-acetaminophen (NORCO/VICODIN) 5-325 MG per tablet Take 1 tablet by mouth every 6 (six) hours as needed for pain.    . isosorbide mononitrate (IMDUR) 30 MG 24 hr tablet Take 1 tablet (30 mg total) by mouth every evening. 30 tablet 2  . MAGNESIUM CARBONATE PO Take 1 tablet by mouth daily.    . meloxicam (MOBIC) 7.5 MG tablet Take 7.5 mg by mouth daily.    . metoprolol tartrate (LOPRESSOR) 25 MG tablet Take 1 tablet (25 mg total) by mouth 2 (two) times daily. 180 tablet 3  . nitroGLYCERIN (NITROSTAT) 0.4 MG SL tablet Place 0.4 mg under the tongue every 5 (five) minutes as needed for chest pain.    . Omega-3 Fatty Acids (FISH OIL) 1000 MG CAPS Take 1 capsule by mouth 2 (two) times daily.      . potassium chloride (K-DUR) 10 MEQ tablet Take 1 tablet (10 mEq total) by mouth daily. 90 tablet 3  . simvastatin (ZOCOR) 40  MG tablet Take 40 mg by mouth at bedtime.      . traZODone (DESYREL) 50 MG tablet Take 1 tablet (50 mg total) by mouth at bedtime. 30 tablet 0  . triamterene-hydrochlorothiazide (MAXZIDE-25) 37.5-25 MG per tablet Take 0.5 tablets by mouth daily.    Marland Kitchen zolpidem (AMBIEN) 10 MG tablet Take 10 mg by mouth at bedtime as needed for sleep.     No current facility-administered medications for this visit.   Allergies:  Review of patient's allergies indicates no known allergies.   Social History: The patient  reports that she has never smoked. She has never used smokeless tobacco. She reports that she does not drink alcohol or use illicit drugs.   ROS:  Please see the history of present illness. Otherwise, complete review of systems is  positive for emotional stress.  All other systems are reviewed and negative.   Physical Exam: VS:  BP 130/62 mmHg  Pulse 74  Ht 5\' 4"  (1.626 m)  Wt 184 lb (83.462 kg)  BMI 31.57 kg/m2  SpO2 96%, BMI Body mass index is 31.57 kg/(m^2).  Wt Readings from Last 3 Encounters:  02/18/15 184 lb (83.462 kg)  01/05/15 186 lb (84.369 kg)  06/29/14 190 lb 6.4 oz (86.365 kg)    Gen.: Patient appears comfortable at rest.  HEENT: Conjunctiva and lids normal, oropharynx clear.  Neck: Supple, no elevated JVP or carotid bruits, no thyromegaly.  Lungs: Clear to auscultation, nonlabored breathing at rest.  Cardiac: Regular rate and rhythm, no S3 or significant systolic murmur, no pericardial rub.  Abdomen: Soft, nontender, bowel sounds present.  Extremities: No pitting edema, distal pulses 2+.  Skin: Warm and dry.  Musculoskeletal: No kyphosis.  Neuropsychiatric: Alert and oriented x3, affect grossly appropriate.  ECG: Tracing from 06/29/2014 showed normal sinus rhythm.  Recent Labwork:  May 2016: BUN 12, creatinine 0.7, AST 21, ALT 22, potassium 4.7, hemoglobin A1c 6.5, triglycerides 161, cholesterol 181, HDL 41, LDL 108  Other Studies Reviewed Today:  1. Echocardiogram from October 2014 demonstrated mild LVH with LVEF Q000111Q, grade 1 diastolic dysfunction, MAC with trivial mitral regurgitation, trivial tricuspid regurgitation with PASP 20 mm mercury, pericardial fat pad noted.  2. Lexiscan Cardiolite 01/06/2014: FINDINGS: ECG/stress data: The patient was stressed according to the Lexiscan protocol. The heart rate ranged from 68 up to 96 beats per min. The blood pressure range from 145/63 up to 162/63. [name] was reported.  Baseline tracing demonstrated normal sinus rhythm. With stress, there were mild nonspecific ST segment changes. No arrhythmias were noted.  Perfusion: No decreased activity in the left ventricle on stress imaging to suggest reversible ischemia or  infarction.  Wall Motion: Normal left ventricular wall motion. No left ventricular dilation.  Left Ventricular Ejection Fraction: 64 %  End diastolic volume 64 ml  End systolic volume 23 ml  IMPRESSION: 1. No reversible ischemia or infarction.  2. Normal left ventricular wall motion.  3. Left ventricular ejection fraction 64%  4. Low-risk stress test findings*.  Assessment and Plan:  1. Accelerating angina symptoms on medical therapy. Imdur has been not as helpful as hoped. As noted above plan is to proceed with a diagnostic cardiac catheterization to most clearly evaluate her coronary anatomy and determine if any revascularization options now need to be considered. This will be scheduled at her convenience.  2. CAD status post DES to the LAD in 2010. Follow-up angiography is planned as outlined. Continue aspirin, Imdur, Lopressor, and Zocor.  3. Essential hypertension.  Also on Maxzide and KCl. Recently well controlled.  4. Hyperlipidemia on statin therapy.  5. Situational stress at home. Could also be some component to her symptoms as well.  Current medicines were reviewed with the patient today.  Disposition: FU with me after cardiac catheterization.   Signed, Satira Sark, MD, [facility] 02/18/2015 9:18 AM    [place] at [place], [place],  [address] Phone: [phone]; Fax: [phone]

## 2015-02-23 NOTE — Interval H&P Note (Signed)
Cath Lab Visit (complete for each Cath Lab visit)  Clinical Evaluation Leading to the Procedure:   ACS: No.  Non-ACS:    Anginal Classification: CCS III  Anti-ischemic medical therapy: Maximal Therapy (2 or more classes of medications)  Non-Invasive Test Results: Low-risk stress test findings: cardiac mortality <1%/year  Prior CABG: No previous CABG   History and Physical Interval Note:  02/23/2015 8:22 AM  [name]  has presented today for surgery, with the diagnosis of angina  The various methods of treatment have been discussed with the patient and family. After consideration of risks, benefits and other options for treatment, the patient has consented to  Procedure(s): Left Heart Cath and Coronary Angiography (N/A) as a surgical intervention .  The patient's history has been reviewed, patient examined, no change in status, stable for surgery.  I have reviewed the patient's chart and labs.  Questions were answered to the patient's satisfaction.     Sherren Mocha

## 2015-02-23 NOTE — Care Management Note (Addendum)
Case Management Note  Patient Details  Name: [name] MRN: [id number] Date of Birth: September 23, 1940  Subjective/Objective:        CAD            Action/Plan: NCM spoke to pt and son lives in the home. Dtrs are available to assist with her care at home. Pt states she was independent prior to hospital stay. Her dtrs take her to MD appts. She uses the [facility] [phone] for her medications. Contact pharmacy and they do have in stock. Provided pt with Brilinta 30 day free trial card. Will check benefits and discuss with pt when available for Brilinta copay amount.    BRILINTA 90 MG BID   COVER- YES  CO-PAY- $120.79 FOR 30 DAY SUPPLY  PRIOR APPROVAL - NO  PHARMACY : [facility]   Expected Discharge Date:  02/23/2014               Expected Discharge Plan:  Home/Self Care  In-House Referral:  NA  Discharge planning Services  CM Consult, Medication Assistance  Post Acute Care Choice:  NA Choice offered to:  NA  DME Arranged:  N/A DME Agency:  NA  HH Arranged:  NA HH Agency:  NA  Status of Service:  Completed, signed off  Medicare Important Message Given:    Date Medicare IM Given:    Medicare IM give by:    Date Additional Medicare IM Given:    Additional Medicare Important Message give by:     If discussed at [place] of Stay Meetings, dates discussed:    Additional Comments:  Erenest Rasher, RN 02/23/2015, 12:00 PM

## 2015-02-23 NOTE — Progress Notes (Signed)
Echocardiogram 2D Echocardiogram has been performed.  [name] 02/23/2015, 3:17 PM

## 2015-02-24 ENCOUNTER — Other Ambulatory Visit: Payer: Self-pay | Admitting: *Deleted

## 2015-02-24 DIAGNOSIS — I200 Unstable angina: Secondary | ICD-10-CM | POA: Diagnosis not present

## 2015-02-24 DIAGNOSIS — M1990 Unspecified osteoarthritis, unspecified site: Secondary | ICD-10-CM | POA: Diagnosis not present

## 2015-02-24 DIAGNOSIS — I10 Essential (primary) hypertension: Secondary | ICD-10-CM | POA: Diagnosis not present

## 2015-02-24 DIAGNOSIS — Z955 Presence of coronary angioplasty implant and graft: Secondary | ICD-10-CM | POA: Diagnosis not present

## 2015-02-24 DIAGNOSIS — I25119 Atherosclerotic heart disease of native coronary artery with unspecified angina pectoris: Secondary | ICD-10-CM | POA: Diagnosis not present

## 2015-02-24 LAB — CBC
HEMATOCRIT: 35 % — AB (ref 36.0–46.0)
HEMOGLOBIN: 11.7 g/dL — AB (ref 12.0–15.0)
MCH: 29.3 pg (ref 26.0–34.0)
MCHC: 33.4 g/dL (ref 30.0–36.0)
MCV: 87.7 fL (ref 78.0–100.0)
Platelets: 190 10*3/uL (ref 150–400)
RBC: 3.99 MIL/uL (ref 3.87–5.11)
RDW: 12.6 % (ref 11.5–15.5)
WBC: 6.8 10*3/uL (ref 4.0–10.5)

## 2015-02-24 LAB — BASIC METABOLIC PANEL
ANION GAP: 9 (ref 5–15)
BUN: 13 mg/dL (ref 6–20)
CO2: 26 mmol/L (ref 22–32)
Calcium: 9.2 mg/dL (ref 8.9–10.3)
Chloride: 104 mmol/L (ref 101–111)
Creatinine, Ser: 0.83 mg/dL (ref 0.44–1.00)
GFR calc Af Amer: >60 mL/min (ref >60)
GFR calc non Af Amer: >60 mL/min (ref >60)
GLUCOSE: 153 mg/dL — AB (ref 65–99)
POTASSIUM: 4 mmol/L (ref 3.5–5.1)
Sodium: 139 mmol/L (ref 135–145)

## 2015-02-24 LAB — GLUCOSE, CAPILLARY: GLUCOSE-CAPILLARY: 151 mg/dL — AB (ref 65–99)

## 2015-02-24 MED ORDER — ASPIRIN 81 MG PO TABS: 81.0000 mg | ORAL_TABLET | Freq: Every day | ORAL | Status: DC

## 2015-02-24 MED ORDER — TICAGRELOR 90 MG PO TABS
90.0000 mg | ORAL_TABLET | Freq: Two times a day (BID) | ORAL | Status: DC
Start: 2015-02-24 — End: 2015-03-23

## 2015-02-24 MED ORDER — PANTOPRAZOLE SODIUM 40 MG PO TBEC: 40.0000 mg | DELAYED_RELEASE_TABLET | Freq: Every day | ORAL | Status: DC

## 2015-02-24 MED ORDER — METOPROLOL TARTRATE 25 MG PO TABS: 25.0000 mg | ORAL_TABLET | Freq: Two times a day (BID) | ORAL | Status: DC

## 2015-02-24 NOTE — Discharge Summary (Signed)
Discharge Summary   Patient ID: [name],  MRN: [id number], DOB/AGE: [birth year] 75 y.o.  Admit date: 02/23/2015 Discharge date: 02/24/2015  Primary Care Provider: Caren Griffins [facility] Primary Cardiologist: Rozann Lesches, MD   Discharge Diagnoses Active Problems:   Exertional angina [facility])   Accelerating angina ([place])   CAD   HL   HTN   DM  Allergies No Known Allergies  Consultant: None  Procedures  Procedures  02/23/15   Coronary Stent Intervention   Left Heart Cath and Coronary Angiography    Conclusion     Prox LAD-1 lesion, 75% stenosed.  Prox LAD-2 lesion, 75% stenosed. Post intervention, there is a 0% residual stenosis. The lesion was previously treated with a stent (unknown type).  Ramus lesion, 30% stenosed.  Prox Cx lesion, 30% stenosed.  Mid RCA lesion, 25% stenosed.  1. Single vessel CAD involving the proximal LAD at the proximal stent edge of the previously implanted DES 2. Successful PCI with a DES platform 3. Known normal LV function by noninvasive assessment  DAPT with ASA and brilinta x 12 months or per [facility] Protocol if enrolled.   Echo 02/23/15 LV EF: 65% -  70%  ------------------------------------------------------------------- Indications:   Chest pain 786.51.  ------------------------------------------------------------------- History:  PMH:  Coronary artery disease. Risk factors: Hypertension. Dyslipidemia.  ------------------------------------------------------------------- Study Conclusions  - Left ventricle: The cavity size was normal. Systolic function was vigorous. The estimated ejection fraction was in the range of 65% to 70%. Wall motion was normal; there were no regional wall motion abnormalities. Doppler parameters are consistent with abnormal left ventricular relaxation (grade 1 diastolic dysfunction). Doppler parameters are consistent with elevated ventricular end-diastolic filling pressure. -  Aortic valve: Trileaflet; mildly thickened, mildly calcified leaflets. Transvalvular velocity was within the normal range. There was no stenosis. There was moderate regurgitation. - Aortic root: The aortic root was normal in size. - Ascending aorta: The ascending aorta was normal in size. - Mitral valve: Calcified annulus. Mildly thickened leaflets . - Left atrium: The atrium was normal in size. - Right ventricle: The cavity size was normal. Wall thickness was normal. Systolic function was normal. - Right atrium: The atrium was normal in size. - Tricuspid valve: There was mild regurgitation. - Pulmonary arteries: Systolic pressure was mildly increased. PA peak pressure: 38 mm Hg (S). - Inferior vena cava: The vessel was normal in size. - Pericardium, extracardiac: There was no pericardial effusion.   History of Present Illness  [name] is a 75 y.o. 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   She has a history of DES to the LAD in 2010, Cardiolite study from November 2015 demonstrated no ischemia with LVEF 64%. Her anginal pain does not improved on Imdur.  She continues to have chest tightness, particularly when she is stressed or "overdoes it." She feels short of breath with these symptoms and also lightheaded. She states that this reminds her of how she felt prior to her original stent placement back in 2010. After reviewing the risks and benefits, plan is to proceed with a diagnostic cardiac catheterization to most clearly evaluate her coronary anatomy and determine if any revascularization options now need to be considered.  Hospital Course  Diagnostic cath showed prox LAD 75% stenosed x 2. S/p Promus stent. DAPT x 12 months. She also has mild disease in other vessels as described above. Echo showed LV Ef of 65-70%, no  wm abnormality, grade 1 DD, moderate AR, pulmonary peak pressure of 90mm  Hg. Renal function stable post cath. Ambulated without chest pain. Will discontinue Imdur.    She has been seen by Dr. Percival Spanish today and deemed ready for discharge home. All follow-up appointments have been scheduled. Discharge medications are listed below.   The patient takes Mobic 7.5mg  daily for pain/arthitis. She is on DAPT. Advised to take Mobic as needed. Will place her on protonix to prevent GI bleed. She will discuss with her PCP for other pain management options.   Discharge Vitals Blood pressure 146/51, pulse 90, temperature 97.7 F (36.5 C), temperature source Oral, resp. rate 16, height 5\' 5"  (1.651 m), weight 185 lb 3 oz (84 kg), SpO2 98 %.  Filed Weights   02/23/15 0708 02/24/15 0318  Weight: 184 lb (83.462 kg) 185 lb 3 oz (84 kg)    Labs  CBC  Recent Labs  02/23/15 0732 02/24/15 0304  WBC 6.1 6.8  HGB 13.2 11.7*  HCT 38.7 35.0*  MCV 86.8 87.7  PLT 209 [id number]   Basic Metabolic Panel  Recent Labs  02/23/15 0732 02/24/15 0304  NA 140 139  K 4.2 4.0  CL 103 104  CO2 25 26  GLUCOSE 137* 153*  BUN 13 13  CREATININE 0.75 0.83  CALCIUM 9.9 9.2    Disposition  Pt is being discharged home today in good condition.  Follow-up Plans & Appointments  Follow-up Information    Follow up with Rozann Lesches, MD. Go on 03/17/2015.   Specialty:  Cardiology   Why:  @8 :40   Contact information:   [place] [address] [phone]           Discharge Instructions    Amb Referral to Cardiac Rehabilitation    Complete by:  As directed   Diagnosis:  PCI     Call MD for:  redness, tenderness, or signs of infection (pain, swelling, redness, odor or green/yellow discharge around incision site)    Complete by:  As directed      Diet - low sodium heart healthy    Complete by:  As directed      Discharge instructions    Complete by:  As directed   No driving for 48 hours. No lifting over 5 lbs for 1 week. No sexual activity for 1 week. Keep  procedure site clean & dry. If you notice increased pain, swelling, bleeding or pus, call/return!  You may shower, but no soaking baths/hot tubs/pools for 1 week.     Increase activity slowly    Complete by:  As directed            F/u Labs/Studies: None  Discharge Medications    Medication List    STOP taking these medications        isosorbide mononitrate 30 MG 24 hr tablet  Commonly known as:  IMDUR      TAKE these medications        aspirin 81 MG tablet  Take 1 tablet (81 mg total) by mouth daily.     bimatoprost 0.01 % Soln  Commonly known as:  LUMIGAN  Place 1 drop into both eyes at bedtime.     Fish Oil 1000 MG Caps  Take 1 capsule by mouth 2 (two) times daily.     Garlic 123XX123 MG Tabs  Take 1 tablet by mouth daily.     HYDROcodone-acetaminophen 5-325 MG tablet  Commonly known as:  NORCO/VICODIN  Take 1 tablet by mouth every 6 (six) hours as needed for pain.     MAGNESIUM CARBONATE PO  Take 1 tablet by mouth daily.     meloxicam 7.5 MG tablet  Commonly known as:  MOBIC  Take 7.5 mg by mouth daily.     metoprolol tartrate 25 MG tablet  Commonly known as:  LOPRESSOR  Take 1 tablet (25 mg total) by mouth 2 (two) times daily.     nitroGLYCERIN 0.4 MG SL tablet  Commonly known as:  NITROSTAT  Place 0.4 mg under the tongue every 5 (five) minutes as needed for chest pain.     pantoprazole 40 MG tablet  Commonly known as:  PROTONIX  Take 1 tablet (40 mg total) by mouth daily.     potassium chloride 10 MEQ tablet  Commonly known as:  K-DUR  Take 1 tablet (10 mEq total) by mouth daily.     simvastatin 40 MG tablet  Commonly known as:  ZOCOR  Take 40 mg by mouth at bedtime.     ticagrelor 90 MG Tabs tablet  Commonly known as:  BRILINTA  Take 1 tablet (90 mg total) by mouth 2 (two) times daily.     traZODone 50 MG tablet  Commonly known as:  DESYREL  Take 1 tablet (50 mg total) by mouth at bedtime.     triamterene-hydrochlorothiazide 37.5-25 MG  tablet  Commonly known as:  MAXZIDE-25  Take 0.5 tablets by mouth daily.     TUMS PO  Take 2 tablets by mouth as needed.     VITAMIN B 12 PO  Take 1 tablet by mouth daily.     vitamin C 500 MG tablet  Commonly known as:  ASCORBIC ACID  Take 500 mg by mouth daily.     Vitamin D 400 units capsule  Take 400 Units by mouth daily.     zolpidem 10 MG tablet  Commonly known as:  AMBIEN  Take 10 mg by mouth at bedtime as needed for sleep.        Duration of Discharge Encounter   Greater than 30 minutes including physician time.  Signed, Bhagat,Bhavinkumar PA-C 02/24/2015, 9:12 AM  Patient seen and examined.  Plan as discussed in my rounding note for today and outlined above. Jeneen Rinks [facility]  02/24/2015  9:37 AM

## 2015-02-24 NOTE — Progress Notes (Signed)
CARDIAC REHAB PHASE I   PRE:  Rate/Rhythm: 88 SR  BP:  Sitting: 146/51        SaO2: 97 RA  MODE:  Ambulation: 500 ft   POST:  Rate/Rhythm: 109 ST  BP:  Sitting: 168/49         SaO2: 100 RA  Pt states she is eager to go home. Pt ambulated 500 ft on RA, handheld assist, steady gait, tolerated well.  Pt c/o of mild DOE, denies cp, dizziness, declined rest stop. Completed PCI/stent education.  Reviewed risk factors, anti-platelet therapy, stent card, activity restrictions, ntg, exercise, heart healthy diet, carb counting, portion control, and phase 2 cardiac rehab. Pt verbalized understanding. Pt agrees to phase 2 cardiac rehab referral, although she states transportation may be an issue as she does not drive and relies on her daughter for transportation. Will send referral to [facility] per pt request. Prior to beginning education, MD removed dressing from radial site, when discussing radial site care, pt noticed oozing from site, RN aware, came to redress site. Pt to recliner after walk, call bell within reach.   [id number] Lenna Sciara, RN, BSN 02/24/2015 9:00 AM

## 2015-02-24 NOTE — Progress Notes (Signed)
SUBJECTIVE:  No pain.  No sob   PHYSICAL EXAM Filed Vitals:   02/23/15 1200 02/23/15 1300 02/23/15 2005 02/24/15 0318  BP: 168/64 150/67 144/71 117/53  Pulse: 76 88 90 78  Temp: 98 F (36.7 C)  98 F (36.7 C) 97.5 F (36.4 C)  TempSrc: Oral  Oral Oral  Resp: 13 18 17 15   Height:      Weight:    185 lb 3 oz (84 kg)  SpO2: 100% 100% 97% 98%   General:  No distress Lungs:  Clear Heart:  RRR Abdomen:  Positive bowel sounds, no rebound no guarding Extremities:  No edema right wrist without bleeding or bruising.  No edema.    LABS: Lab Results  Component Value Date   TROPONINI <0.30 11/02/2012   Results for orders placed or performed during the hospital encounter of 02/23/15 (from the past 24 hour(s))  No blood products     Status: None   Collection Time: 02/23/15  8:25 AM  Result Value Ref Range   Transfuse no blood products      TRANSFUSE NO BLOOD PRODUCTS, VERIFIED BY SHALORENE SMITH,RN  POCT Activated clotting time     Status: None   Collection Time: 02/23/15  9:18 AM  Result Value Ref Range   Activated Clotting Time 260 seconds  Glucose, capillary     Status: Abnormal   Collection Time: 02/23/15 10:12 AM  Result Value Ref Range   Glucose-Capillary 146 (H) 65 - 99 mg/dL  Glucose, capillary     Status: Abnormal   Collection Time: 02/23/15  6:00 PM  Result Value Ref Range   Glucose-Capillary 102 (H) 65 - 99 mg/dL  Glucose, capillary     Status: Abnormal   Collection Time: 02/23/15  9:49 PM  Result Value Ref Range   Glucose-Capillary 127 (H) 65 - 99 mg/dL   Comment 1 Notify RN    Comment 2 Document in Chart   Basic metabolic panel     Status: Abnormal   Collection Time: 02/24/15  3:04 AM  Result Value Ref Range   Sodium 139 135 - 145 mmol/L   Potassium 4.0 3.5 - 5.1 mmol/L   Chloride 104 101 - 111 mmol/L   CO2 26 22 - 32 mmol/L   Glucose, Bld 153 (H) 65 - 99 mg/dL   BUN 13 6 - 20 mg/dL   Creatinine, Ser 0.83 0.44 - 1.00 mg/dL   Calcium 9.2 8.9 - 10.3  mg/dL   GFR calc non Af Amer >60 >60 mL/min   GFR calc Af Amer >60 >60 mL/min   Anion gap 9 5 - 15  CBC     Status: Abnormal   Collection Time: 02/24/15  3:04 AM  Result Value Ref Range   WBC 6.8 4.0 - 10.5 K/uL   RBC 3.99 3.87 - 5.11 MIL/uL   Hemoglobin 11.7 (L) 12.0 - 15.0 g/dL   HCT 35.0 (L) 36.0 - 46.0 %   MCV 87.7 78.0 - 100.0 fL   MCH 29.3 26.0 - 34.0 pg   MCHC 33.4 30.0 - 36.0 g/dL   RDW 12.6 11.5 - 15.5 %   Platelets 190 150 - 400 K/uL  Glucose, capillary     Status: Abnormal   Collection Time: 02/24/15  6:51 AM  Result Value Ref Range   Glucose-Capillary 151 (H) 65 - 99 mg/dL   Comment 1 Notify RN    Comment 2 Document in Chart     Intake/Output Summary (Last  24 hours) at 02/24/15 0817 Last data filed at 02/24/15 0739  Gross per 24 hour  Intake 796.67 ml  Output   1150 ml  Net -353.33 ml    EKG:   NSR, no acute ST T wave changes.  02/24/2015  ASSESSMENT AND PLAN:  CAD:   LAD 75% lesions x 2.  Status post Promus stent.  DAPT x 12 months.   OK to stop Imdur at discharge  DYSLIPIDEMIA:    Follow as an out patient.  Continue current therapy.    OK to discharge.  She has follow up on the 25th.    Jeneen Rinks [facility] 02/24/2015 8:17 AM

## 2015-02-26 ENCOUNTER — Telehealth: Payer: Self-pay | Admitting: Cardiology

## 2015-02-26 NOTE — Telephone Encounter (Signed)
Patient informed. 

## 2015-02-26 NOTE — Telephone Encounter (Signed)
[name] wants to know if she can start going back to her Chiropractor .

## 2015-02-26 NOTE — Telephone Encounter (Signed)
I do not see a major contraindication from a cardiac perspective for her to continue with chiropractic visits.

## 2015-02-26 NOTE — Telephone Encounter (Signed)
Per patient, she had stents placed this week and wants to know if its okay for her to continue her chiropractor visits on Mondays that consist of a spine alignment.

## 2015-03-02 ENCOUNTER — Telehealth: Payer: Self-pay | Admitting: Cardiology

## 2015-03-02 NOTE — Telephone Encounter (Signed)
Patient c/o tenderness and a knot on her right wrist area of the cath site. Patient denies redness, drainage or fever. Patient advised that its normal to have a small knot and tenderness to the area of cath site and to continue to monitor the area for sign and symptoms of infection like redness, swelling, drainage or fever. Patient advised to contact our office back if it gets worse or doesn't improve. Patient verbalized understanding of plan.

## 2015-03-02 NOTE — Telephone Encounter (Signed)
[name] called stating that she has had a knot come up around the site where they put IV's in from her Cath of last week. States that it is very tender.  No redness or hot to touch.

## 2015-03-17 ENCOUNTER — Encounter: Payer: Self-pay | Admitting: Cardiology

## 2015-03-17 ENCOUNTER — Ambulatory Visit (INDEPENDENT_AMBULATORY_CARE_PROVIDER_SITE_OTHER): Payer: Medicare HMO | Admitting: Cardiology

## 2015-03-17 ENCOUNTER — Ambulatory Visit (INDEPENDENT_AMBULATORY_CARE_PROVIDER_SITE_OTHER): Payer: Medicare HMO

## 2015-03-17 VITALS — BP 112/62 | HR 79 | Ht 65.0 in | Wt 178.0 lb

## 2015-03-17 DIAGNOSIS — R55 Syncope and collapse: Secondary | ICD-10-CM

## 2015-03-17 DIAGNOSIS — E782 Mixed hyperlipidemia: Secondary | ICD-10-CM | POA: Diagnosis not present

## 2015-03-17 DIAGNOSIS — I2510 Atherosclerotic heart disease of native coronary artery without angina pectoris: Secondary | ICD-10-CM

## 2015-03-17 DIAGNOSIS — I10 Essential (primary) hypertension: Secondary | ICD-10-CM

## 2015-03-17 NOTE — Patient Instructions (Signed)
Your physician recommends that you continue on your current medications as directed. Please refer to the Current Medication list given to you today. Your physician has recommended that you wear a holter monitor. Holter monitors are medical devices that record the heart's electrical activity. Doctors most often use these monitors to diagnose arrhythmias. Arrhythmias are problems with the speed or rhythm of the heartbeat. The monitor is a small, portable device. You can wear one while you do your normal daily activities. This is usually used to diagnose what is causing palpitations/syncope (passing out). We are sending a patient assistance application to help with brilinta coverage. Your physician recommends that you schedule a follow-up appointment in: 1 month.

## 2015-03-17 NOTE — Progress Notes (Signed)
Cardiology Office Note  Date: 03/17/2015   ID: [name], DOB 01-Dec-1940, MRN [id number]  PCP: Octavio Graves, DO  Primary Cardiologist: Rozann Lesches, MD   Chief Complaint  Patient presents with  . Follow-up cardiac catheterization  . Coronary Artery Disease    History of Present Illness: [name] is a 75 y.o. female last seen in late December 2016 at which time she was referred for a diagnostic cardiac catheterization to reassess coronary and its anatomy in the setting of accelerating angina symptoms on medical therapy. Procedure was performed by Dr. Burt Knack with finding of a 75% proximal LAD stenosis at the proximal edge of the previously placed stent. She was managed with DES.  She comes in today for a routine follow-up visit. Reports improved angina symptoms. We discussed her medications which are outlined below. She has been taking aspirin and Brilinta, but has concerns about being able to afford Brilinta over time. We did provide her samples and also will see if she qualifies for the assistance program. She does not report any bleeding problems. Otherwise her cardiac medications are unchanged and outlined below.  She tells me that she has been experiencing a new symptom, brief feeling of lightheadedness as if she might pass out. This has happened 2 or 3 times, usually when she is at rest. She does not feel any chest pain with this or shortness of breath. No distinct palpitations. This has been occurring since last week.  I reviewed the results of her cardiac catheterization and also echocardiogram and most recent ECG.  She had lipids obtained this past May per Dr. Melina Copa at which time LDL was 108. She reports compliance with Zocor. We may need to further up titrate the dose depending on her next follow-up level.  Past Medical History  Diagnosis Date  . Glaucoma   . Bulging lumbar disc   . Essential hypertension, benign   . Dyslipidemia   . Coronary  atherosclerosis of native coronary artery     DES LAD 1/10  . Refusal of blood transfusions as patient is Jehovah's Witness   . Anginal pain ([place])   . Type 2 diabetes mellitus (HCC)     Borderline  . Frequent headaches   . Arthritis   . Chronic lower back pain   . Depression     Past Surgical History  Procedure Laterality Date  . Dilation and curettage of uterus    . Tubal ligation    . Coronary angioplasty with stent placement  2009  . Coronary angioplasty  02/23/2015  . Cardiac catheterization N/A 02/23/2015    Procedure: Left Heart Cath and Coronary Angiography;  Surgeon: Sherren Mocha, MD;  Location: [facility];  Service: Cardiovascular;  Laterality: N/A;  . Cardiac catheterization N/A 02/23/2015    Procedure: Coronary Stent Intervention;  Surgeon: Sherren Mocha, MD;  Location: [facility];  Service: Cardiovascular;  Laterality: N/A;    Current Outpatient Prescriptions  Medication Sig Dispense Refill  . Ascorbic Acid (VITAMIN C) 500 MG tablet Take 500 mg by mouth daily.      Marland Kitchen aspirin 81 MG tablet Take 1 tablet (81 mg total) by mouth daily.    . bimatoprost (LUMIGAN) 0.01 % SOLN Place 1 drop into both eyes at bedtime.    . Calcium Carbonate Antacid (TUMS PO) Take 2 tablets by mouth as needed.     . Cholecalciferol (VITAMIN D) 400 UNITS capsule Take 400 Units by mouth daily.      Marland Kitchen  Cyanocobalamin (VITAMIN B 12 PO) Take 1 tablet by mouth daily.    . Garlic 123XX123 MG TABS Take 1 tablet by mouth daily.      Marland Kitchen HYDROcodone-acetaminophen (NORCO/VICODIN) 5-325 MG per tablet Take 1 tablet by mouth every 6 (six) hours as needed for pain.    Marland Kitchen MAGNESIUM CARBONATE PO Take 1 tablet by mouth daily.    . meloxicam (MOBIC) 7.5 MG tablet Take 7.5 mg by mouth daily.    . metoprolol tartrate (LOPRESSOR) 25 MG tablet Take 1 tablet (25 mg total) by mouth 2 (two) times daily. 180 tablet 3  . nitroGLYCERIN (NITROSTAT) 0.4 MG SL tablet Place 0.4 mg under the tongue every 5 (five) minutes as  needed for chest pain.    . Omega-3 Fatty Acids (FISH OIL) 1000 MG CAPS Take 1 capsule by mouth 2 (two) times daily.      . pantoprazole (PROTONIX) 40 MG tablet Take 1 tablet (40 mg total) by mouth daily. 30 tablet 11  . potassium chloride (K-DUR) 10 MEQ tablet Take 1 tablet (10 mEq total) by mouth daily. 90 tablet 3  . simvastatin (ZOCOR) 40 MG tablet Take 40 mg by mouth at bedtime.      . ticagrelor (BRILINTA) 90 MG TABS tablet Take 1 tablet (90 mg total) by mouth 2 (two) times daily. 60 tablet 11  . traZODone (DESYREL) 50 MG tablet Take 1 tablet (50 mg total) by mouth at bedtime. 30 tablet 0  . triamterene-hydrochlorothiazide (MAXZIDE-25) 37.5-25 MG per tablet Take 0.5 tablets by mouth daily.    Marland Kitchen zolpidem (AMBIEN) 10 MG tablet Take 10 mg by mouth at bedtime as needed for sleep.     No current facility-administered medications for this visit.   Allergies:  Review of patient's allergies indicates no known allergies.   Social History: The patient  reports that she has never smoked. She has never used smokeless tobacco. She reports that she does not drink alcohol or use illicit drugs.   ROS:  Please see the history of present illness. Otherwise, complete review of systems is positive for arthritic pains.  All other systems are reviewed and negative.   Physical Exam: VS:  BP 112/62 mmHg  Pulse 79  Ht 5\' 5"  (1.651 m)  Wt 178 lb (80.74 kg)  BMI 29.62 kg/m2  SpO2 98%, BMI Body mass index is 29.62 kg/(m^2).  Wt Readings from Last 3 Encounters:  03/17/15 178 lb (80.74 kg)  02/24/15 185 lb 3 oz (84 kg)  02/18/15 184 lb (83.462 kg)    Gen.: Patient appears comfortable at rest.  HEENT: Conjunctiva and lids normal, oropharynx clear.  Neck: Supple, no elevated JVP or carotid bruits, no thyromegaly.  Lungs: Clear to auscultation, nonlabored breathing at rest.  Cardiac: Regular rate and rhythm, no S3 or significant systolic murmur, no pericardial rub.  Abdomen: Soft, nontender, bowel  sounds present.  Extremities: No pitting edema, distal pulses 2+. Healed right radial arteriotomy site. Skin: Warm and dry.  Musculoskeletal: No kyphosis.  Neuropsychiatric: Alert and oriented x3, affect grossly appropriate.  ECG:  I reviewed her tracing from 02/24/2015 which showed normal sinus rhythm.  Recent Labwork: 02/24/2015: BUN 13; Creatinine, Ser 0.83; Hemoglobin 11.7*; Platelets 190; Potassium 4.0; Sodium 139  May 2016 : Cholesterol 181, HDL 41, LDL 108 , triglycerides 161 , AST 22, ALT 22  Other Studies Reviewed Today:  Cardiac catheterization 02/23/2015:  Prox LAD-1 lesion, 75% stenosed.  Prox LAD-2 lesion, 75% stenosed. Post intervention, there is a 0%  residual stenosis. The lesion was previously treated with a stent (unknown type).  Ramus lesion, 30% stenosed.  Prox Cx lesion, 30% stenosed.  Mid RCA lesion, 25% stenosed.  1. Single vessel CAD involving the proximal LAD at the proximal stent edge of the previously implanted DES 2. Successful PCI with a DES platform 3. Known normal LV function by noninvasive assessment  DAPT with ASA and brilinta x 12 months or per Twilight Protocol if enrolled.  Echocardiogram 02/23/2015: Study Conclusions  - Left ventricle: The cavity size was normal. Systolic function was vigorous. The estimated ejection fraction was in the range of 65% to 70%. Wall motion was normal; there were no regional wall motion abnormalities. Doppler parameters are consistent with abnormal left ventricular relaxation (grade 1 diastolic dysfunction). Doppler parameters are consistent with elevated ventricular end-diastolic filling pressure. - Aortic valve: Trileaflet; mildly thickened, mildly calcified leaflets. Transvalvular velocity was within the normal range. There was no stenosis. There was moderate regurgitation. - Aortic root: The aortic root was normal in size. - Ascending aorta: The ascending aorta was normal in size. - Mitral  valve: Calcified annulus. Mildly thickened leaflets . - Left atrium: The atrium was normal in size. - Right ventricle: The cavity size was normal. Wall thickness was normal. Systolic function was normal. - Right atrium: The atrium was normal in size. - Tricuspid valve: There was mild regurgitation. - Pulmonary arteries: Systolic pressure was mildly increased. PA peak pressure: 38 mm Hg (S). - Inferior vena cava: The vessel was normal in size. - Pericardium, extracardiac: There was no pericardial effusion.  Assessment and Plan:  1. CAD status post recent placement of DES to the proximal LAD, involving a new 75% stenosis at the edge of a previously placed stent. She reports improved angina symptoms. We will plan to continue medical therapy and observation. LVEF is normal range by echocardiogram. She is on aspirin and Brilinta. Provided samples of Brilinta today and also will try to see if she qualifies for the assistance program.  2. New episodes of sudden feeling of lightheadedness as if she might pass out. Not associated with chest pain or palpitations necessarily. We will place a cardiac monitor for further evaluation. Otherwise no change in medications for now.  3. Essential hypertension, blood pressure control is good today.  4. Hyperlipidemia, on Zocor. Keep follow-up with Dr. Melina Copa. Depending on next set of lipids, we can determine whether further adjustments need to be made.  Current medicines were reviewed with the patient today.   Orders Placed This Encounter  Procedures  . Holter monitor - 48 hour    Disposition: FU with me in 1 month.   Signed, Satira Sark, MD, [facility] 03/17/2015 8:51 AM    [facility] at [address], [place], [place] [address] Phone: [phone]; Fax: [phone]

## 2015-03-23 ENCOUNTER — Other Ambulatory Visit: Payer: Self-pay | Admitting: *Deleted

## 2015-03-23 MED ORDER — TICAGRELOR 90 MG PO TABS: 90.0000 mg | ORAL_TABLET | Freq: Two times a day (BID) | ORAL | Status: DC

## 2015-03-24 ENCOUNTER — Telehealth: Payer: Self-pay | Admitting: *Deleted

## 2015-03-24 MED ORDER — METOPROLOL TARTRATE 25 MG PO TABS: 25.0000 mg | ORAL_TABLET | Freq: Two times a day (BID) | ORAL | Status: DC

## 2015-03-24 NOTE — Telephone Encounter (Signed)
Patient informed and verbalized understanding of plan. 

## 2015-03-24 NOTE — Telephone Encounter (Signed)
-----   Message from Satira Sark, MD sent at 03/24/2015  8:32 AM EST ----- I reviewed her monitor strips. She is having episodes of PSVT which may be explaining some of her intermittent dizziness. She is on Lopressor 25 mg twice daily, will try and increase this to 50 mg twice daily to see if it helps her symptoms.

## 2015-03-26 MED ORDER — METOPROLOL TARTRATE 50 MG PO TABS: 50.0000 mg | ORAL_TABLET | Freq: Two times a day (BID) | ORAL | Status: DC

## 2015-03-26 NOTE — Addendum Note (Signed)
Addended by: Merlene Laughter on: 03/26/2015 02:55 PM   Modules accepted: Orders, Medications

## 2015-03-26 NOTE — Telephone Encounter (Signed)
Patient called office because the prescription for metoprolol tartrate that was sent to her pharmacy was the exact dosage that she was already taking. Nurse apologized to patient and informed her that she could take 2 of the 25 mg lopressor twice daily until they were finished the new dose of 50 mg twice daily of lopressor would be available for pick up when she runs out. Patient verbalized understanding.

## 2015-04-05 ENCOUNTER — Other Ambulatory Visit: Payer: Self-pay | Admitting: Cardiology

## 2015-04-05 NOTE — Telephone Encounter (Signed)
No answer

## 2015-04-05 NOTE — Telephone Encounter (Signed)
Called stating she needs more samples of Brilanta

## 2015-04-05 NOTE — Telephone Encounter (Signed)
Has doc appt at 4pm will call back to see if samples are readty

## 2015-04-06 MED ORDER — TICAGRELOR 90 MG PO TABS: 90.0000 mg | ORAL_TABLET | Freq: Two times a day (BID) | ORAL | Status: DC

## 2015-04-06 NOTE — Telephone Encounter (Signed)
Patient informed to come by the office for samples.

## 2015-04-26 ENCOUNTER — Ambulatory Visit (INDEPENDENT_AMBULATORY_CARE_PROVIDER_SITE_OTHER): Payer: Medicare HMO | Admitting: Cardiology

## 2015-04-26 ENCOUNTER — Encounter: Payer: Self-pay | Admitting: Cardiology

## 2015-04-26 VITALS — BP 146/75 | HR 66 | Ht 65.0 in | Wt 185.6 lb

## 2015-04-26 DIAGNOSIS — I2510 Atherosclerotic heart disease of native coronary artery without angina pectoris: Secondary | ICD-10-CM

## 2015-04-26 DIAGNOSIS — I471 Supraventricular tachycardia: Secondary | ICD-10-CM | POA: Diagnosis not present

## 2015-04-26 DIAGNOSIS — I10 Essential (primary) hypertension: Secondary | ICD-10-CM | POA: Diagnosis not present

## 2015-04-26 MED ORDER — TICAGRELOR 90 MG PO TABS: 90.0000 mg | ORAL_TABLET | Freq: Two times a day (BID) | ORAL | Status: DC

## 2015-04-26 NOTE — Progress Notes (Signed)
Cardiology Office Note  Date: 04/26/2015   ID: [name], DOB [birth year], MRN [id number]  PCP: Octavio Graves, DO  Primary Cardiologist: Rozann Lesches, MD   Chief Complaint  Patient presents with  . Coronary Artery Disease  . Follow-up monitor    History of Present Illness: [name] is a 75 y.o. female last seen in January. She presents for a follow-up visit. I reviewed her 48 hour Holter monitor which showed episodes of rapid PSVT, generally fairly brief with the longest recorded episode being 5 seconds. A 2.1 second pause was noted that was asymptomatic. In light of her episodic dizziness we elected to increase her beta blocker dose.  She states that the episodes of dizziness have improved following medication adjustments. She still feels short of breath and states that she is getting over a bad "cold." Intermittent wheezing noted. I have asked her to keep an eye on this. No active angina symptoms.  Past Medical History  Diagnosis Date  . Glaucoma   . Bulging lumbar disc   . Essential hypertension, benign   . Dyslipidemia   . Coronary atherosclerosis of native coronary artery     DES LAD 1/10  . Refusal of blood transfusions as patient is Jehovah's Witness   . Anginal pain ([place])   . Type 2 diabetes mellitus (HCC)     Borderline  . Frequent headaches   . Arthritis   . Chronic lower back pain   . Depression     Current Outpatient Prescriptions  Medication Sig Dispense Refill  . Ascorbic Acid (VITAMIN C) 500 MG tablet Take 500 mg by mouth daily.      Marland Kitchen aspirin 81 MG tablet Take 1 tablet (81 mg total) by mouth daily.    . bimatoprost (LUMIGAN) 0.01 % SOLN Place 1 drop into both eyes at bedtime.    . Calcium Carbonate Antacid (TUMS PO) Take 2 tablets by mouth as needed.     . Cholecalciferol (VITAMIN D) 400 UNITS capsule Take 400 Units by mouth daily.      . Cyanocobalamin (VITAMIN B 12 PO) Take 1 tablet by mouth daily.    . Garlic 123XX123 MG TABS Take 1 tablet  by mouth daily.      Marland Kitchen HYDROcodone-acetaminophen (NORCO/VICODIN) 5-325 MG per tablet Take 1 tablet by mouth every 6 (six) hours as needed for pain.    Marland Kitchen MAGNESIUM CARBONATE PO Take 1 tablet by mouth daily.    . meloxicam (MOBIC) 7.5 MG tablet Take 7.5 mg by mouth daily.    . metoprolol (LOPRESSOR) 50 MG tablet Take 1 tablet (50 mg total) by mouth 2 (two) times daily. 180 tablet 3  . nitroGLYCERIN (NITROSTAT) 0.4 MG SL tablet Place 0.4 mg under the tongue every 5 (five) minutes as needed for chest pain.    . Omega-3 Fatty Acids (FISH OIL) 1000 MG CAPS Take 1 capsule by mouth 2 (two) times daily.      . pantoprazole (PROTONIX) 40 MG tablet Take 1 tablet (40 mg total) by mouth daily. 30 tablet 11  . potassium chloride (K-DUR) 10 MEQ tablet Take 1 tablet (10 mEq total) by mouth daily. 90 tablet 3  . simvastatin (ZOCOR) 40 MG tablet Take 40 mg by mouth at bedtime.      . ticagrelor (BRILINTA) 90 MG TABS tablet Take 1 tablet (90 mg total) by mouth 2 (two) times daily. 32 tablet 0  . traZODone (DESYREL) 50 MG tablet Take 1 tablet (50 mg  total) by mouth at bedtime. 30 tablet 0  . triamterene-hydrochlorothiazide (MAXZIDE-25) 37.5-25 MG per tablet Take 0.5 tablets by mouth daily.    Marland Kitchen zolpidem (AMBIEN) 10 MG tablet Take 10 mg by mouth at bedtime as needed for sleep.     No current facility-administered medications for this visit.   Allergies:  Review of patient's allergies indicates no known allergies.   Social History: The patient  reports that she has never smoked. She has never used smokeless tobacco. She reports that she does not drink alcohol or use illicit drugs.   ROS:  Please see the history of present illness. Otherwise, complete review of systems is positive for recent cold and wheezing.  All other systems are reviewed and negative.   Physical Exam: VS:  BP 146/75 mmHg  Pulse 66  Ht 5\' 5"  (1.651 m)  Wt 185 lb 9.6 oz (84.188 kg)  BMI 30.89 kg/m2  SpO2 98%, BMI Body mass index is 30.89  kg/(m^2).  Wt Readings from Last 3 Encounters:  04/26/15 185 lb 9.6 oz (84.188 kg)  03/17/15 178 lb (80.74 kg)  02/24/15 185 lb 3 oz (84 kg)    Gen.: Patient appears comfortable at rest.  HEENT: Conjunctiva and lids normal, oropharynx clear.  Neck: Supple, no elevated JVP or carotid bruits, no thyromegaly.  Lungs: Clear to auscultation, nonlabored breathing at rest.  Cardiac: Regular rate and rhythm, no S3 or significant systolic murmur, no pericardial rub.  Abdomen: Soft, nontender, bowel sounds present.  Extremities: No pitting edema, distal pulses 2+. Healed right radial arteriotomy site.  ECG: I personally reviewed the prior tracing from 02/24/2015 which showed normal sinus rhythm.  Recent Labwork: 02/24/2015: BUN 13; Creatinine, Ser 0.83; Hemoglobin 11.7*; Platelets 190; Potassium 4.0; Sodium 139   Other Studies Reviewed Today:  Cardiac catheterization 02/23/2015:  Prox LAD-1 lesion, 75% stenosed.  Prox LAD-2 lesion, 75% stenosed. Post intervention, there is a 0% residual stenosis. The lesion was previously treated with a stent (unknown type).  Ramus lesion, 30% stenosed.  Prox Cx lesion, 30% stenosed.  Mid RCA lesion, 25% stenosed.  1. Single vessel CAD involving the proximal LAD at the proximal stent edge of the previously implanted DES 2. Successful PCI with a DES platform 3. Known normal LV function by noninvasive assessment  DAPT with ASA and brilinta x 12 months or per Twilight Protocol if enrolled.  Assessment and Plan:  1. Documentation of episodic PSVT, generally brief but symptomatic. Symptoms have improved with increase in beta blocker. Continue observation.  2. CAD status post DES to the LAD in January of this year. Continue DAPT, samples for Brilinta given.  3. Essential hypertension, no changes made to current regimen.  Current medicines were reviewed with the patient today.  Disposition: FU with me in 3 months.   Signed, Satira Sark,  MD, [facility] 04/26/2015 4:22 PM    [facility] at [address], [place], [place] [address] Phone: [phone]; Fax: [phone]

## 2015-04-26 NOTE — Patient Instructions (Signed)
Your physician recommends that you schedule a follow-up appointment in: [place]  Your physician recommends that you continue on your current medications as directed. Please refer to the Current Medication list given to you today.  WE HAVE GIVEN YOU SAMPLES OF BRILINTA  Thank you for choosing [facility]!!

## 2015-05-24 ENCOUNTER — Other Ambulatory Visit: Payer: Self-pay | Admitting: *Deleted

## 2015-05-24 MED ORDER — TICAGRELOR 90 MG PO TABS
90.0000 mg | ORAL_TABLET | Freq: Two times a day (BID) | ORAL | Status: DC
Start: 2015-05-24 — End: 2015-05-26

## 2015-05-26 ENCOUNTER — Other Ambulatory Visit: Payer: Self-pay | Admitting: *Deleted

## 2015-05-26 MED ORDER — TICAGRELOR 90 MG PO TABS: 90.0000 mg | ORAL_TABLET | Freq: Two times a day (BID) | ORAL | Status: DC

## 2015-06-23 ENCOUNTER — Other Ambulatory Visit: Payer: Self-pay | Admitting: *Deleted

## 2015-06-23 MED ORDER — TICAGRELOR 90 MG PO TABS: 90.0000 mg | ORAL_TABLET | Freq: Two times a day (BID) | ORAL | Status: DC

## 2015-06-23 NOTE — Telephone Encounter (Signed)
Patient called to say she received a letter from [facility] requesting more information to process her application for brilinta. Patient said she would bring it to our office. Samples given to patient also.

## 2015-07-12 ENCOUNTER — Other Ambulatory Visit: Payer: Self-pay | Admitting: *Deleted

## 2015-07-12 MED ORDER — TICAGRELOR 90 MG PO TABS: 90.0000 mg | ORAL_TABLET | Freq: Two times a day (BID) | ORAL | Status: DC

## 2015-07-12 NOTE — Telephone Encounter (Signed)
Patient advised that a copy of her medicaid card is not available in her chart to send to [facility] for patient assistance. Patient said that she would leave a copy of front today.

## 2015-07-22 ENCOUNTER — Telehealth: Payer: Self-pay | Admitting: Pharmacist

## 2015-07-22 NOTE — Telephone Encounter (Signed)
Spoke to patient about voluntary drug recall of sample Brilinta. She states that she believes she has finished these particular lots of Brilinta (though hard to tell for sure because she puts all of the Brilinta together in the same bottle) and she did not have any pills that were a different color or shape. She stated she would have definitely called the office if this had been the case. She has no questions about the medication recall. Instructed her to call the office if she had further questions about the recall. She stated she understood and thanked me for the call.

## 2015-07-26 ENCOUNTER — Encounter: Payer: Self-pay | Admitting: Cardiology

## 2015-07-26 ENCOUNTER — Ambulatory Visit (INDEPENDENT_AMBULATORY_CARE_PROVIDER_SITE_OTHER): Payer: Medicare HMO | Admitting: Cardiology

## 2015-07-26 VITALS — BP 130/70 | HR 71 | Ht 65.0 in | Wt 183.0 lb

## 2015-07-26 DIAGNOSIS — I471 Supraventricular tachycardia: Secondary | ICD-10-CM | POA: Diagnosis not present

## 2015-07-26 DIAGNOSIS — I2510 Atherosclerotic heart disease of native coronary artery without angina pectoris: Secondary | ICD-10-CM | POA: Diagnosis not present

## 2015-07-26 DIAGNOSIS — I10 Essential (primary) hypertension: Secondary | ICD-10-CM

## 2015-07-26 MED ORDER — TICAGRELOR 90 MG PO TABS: 90.0000 mg | ORAL_TABLET | Freq: Two times a day (BID) | ORAL | Status: DC

## 2015-07-26 NOTE — Patient Instructions (Signed)
Your physician recommends that you continue on your current medications as directed. Please refer to the Current Medication list given to you today. Your physician recommends that you schedule a follow-up appointment in: 6 months. You will receive a reminder letter in the mail in about 4 months reminding you to call and schedule your appointment. If you don't receive this letter, please contact our office. 

## 2015-07-26 NOTE — Progress Notes (Signed)
Cardiology Office Note  Date: 07/26/2015   ID: [name], DOB 10/29/1940, MRN [id number]  PCP: Octavio Graves, DO  Primary Cardiologist: Rozann Lesches, MD   Chief Complaint  Patient presents with  . Coronary Artery Disease  . PSVT    History of Present Illness: [name] is a 75 y.o. female last seen in March. Beta blocker dose has been increased for management of PSVT, brief episodes documented by cardiac monitoring. She presents for a follow-up visit, no progressive symptoms reported. She has been working some in her yard, enjoys tending flowers.  Most recent cardiac catheterization from January of this year is outlined below. She continues on aspirin and Brilinta. No reported bleeding problems. No angina symptoms.  She continues to follow with Dr. Melina Copa for primary care.  Past Medical History  Diagnosis Date  . Glaucoma   . Bulging lumbar disc   . Essential hypertension, benign   . Dyslipidemia   . Coronary atherosclerosis of native coronary artery     DES LAD 1/10  . Refusal of blood transfusions as patient is Jehovah's Witness   . Type 2 diabetes mellitus (HCC)     Borderline  . Frequent headaches   . Arthritis   . Chronic lower back pain   . Depression     Current Outpatient Prescriptions  Medication Sig Dispense Refill  . Ascorbic Acid (VITAMIN C) 500 MG tablet Take 500 mg by mouth daily.      Marland Kitchen aspirin 81 MG tablet Take 1 tablet (81 mg total) by mouth daily.    . bimatoprost (LUMIGAN) 0.01 % SOLN Place 1 drop into both eyes at bedtime.    . Calcium Carbonate Antacid (TUMS PO) Take 2 tablets by mouth as needed.     . Cholecalciferol (VITAMIN D) 400 UNITS capsule Take 400 Units by mouth daily.      . Cyanocobalamin (VITAMIN B 12 PO) Take 1 tablet by mouth daily.    . Garlic 123XX123 MG TABS Take 1 tablet by mouth daily.      Marland Kitchen HYDROcodone-acetaminophen (NORCO/VICODIN) 5-325 MG per tablet Take 1 tablet by mouth every 6 (six) hours as needed for pain.      Marland Kitchen MAGNESIUM CARBONATE PO Take 1 tablet by mouth daily.    . meloxicam (MOBIC) 7.5 MG tablet Take 7.5 mg by mouth daily.    . metoprolol (LOPRESSOR) 50 MG tablet Take 1 tablet (50 mg total) by mouth 2 (two) times daily. 180 tablet 3  . nitroGLYCERIN (NITROSTAT) 0.4 MG SL tablet Place 0.4 mg under the tongue every 5 (five) minutes as needed for chest pain.    . Omega-3 Fatty Acids (FISH OIL) 1000 MG CAPS Take 1 capsule by mouth 2 (two) times daily.      . pantoprazole (PROTONIX) 40 MG tablet Take 1 tablet (40 mg total) by mouth daily. 30 tablet 11  . potassium chloride (K-DUR) 10 MEQ tablet Take 1 tablet (10 mEq total) by mouth daily. 90 tablet 3  . simvastatin (ZOCOR) 40 MG tablet Take 40 mg by mouth at bedtime.      . ticagrelor (BRILINTA) 90 MG TABS tablet Take 1 tablet (90 mg total) by mouth 2 (two) times daily. 48 tablet 0  . traZODone (DESYREL) 50 MG tablet Take 1 tablet (50 mg total) by mouth at bedtime. 30 tablet 0  . triamterene-hydrochlorothiazide (MAXZIDE-25) 37.5-25 MG per tablet Take 0.5 tablets by mouth daily.    Marland Kitchen zolpidem (AMBIEN) 10 MG tablet  Take 10 mg by mouth at bedtime as needed for sleep.     No current facility-administered medications for this visit.   Allergies:  Review of patient's allergies indicates no known allergies.   Social History: The patient  reports that she has never smoked. She has never used smokeless tobacco. She reports that she does not drink alcohol or use illicit drugs.   ROS:  Please see the history of present illness. Otherwise, complete review of systems is positive for stress and anxiety.  All other systems are reviewed and negative.   Physical Exam: VS:  BP 130/70 mmHg  Pulse 71  Ht 5\' 5"  (1.651 m)  Wt 183 lb (83.008 kg)  BMI 30.45 kg/m2  SpO2 94%, BMI Body mass index is 30.45 kg/(m^2).  Wt Readings from Last 3 Encounters:  07/26/15 183 lb (83.008 kg)  04/26/15 185 lb 9.6 oz (84.188 kg)  03/17/15 178 lb (80.74 kg)    Gen.: Patient  appears comfortable at rest.  HEENT: Conjunctiva and lids normal, oropharynx clear.  Neck: Supple, no elevated JVP or carotid bruits, no thyromegaly.  Lungs: Clear to auscultation, nonlabored breathing at rest.  Cardiac: Regular rate and rhythm, no S3 or significant systolic murmur, no pericardial rub.  Abdomen: Soft, nontender, bowel sounds present.  Extremities: No pitting edema, distal pulses 2+.  ECG: I personally reviewed the prior tracing from 02/24/2015 which showed normal sinus rhythm.  Recent Labwork: 02/24/2015: BUN 13; Creatinine, Ser 0.83; Hemoglobin 11.7*; Platelets 190; Potassium 4.0; Sodium 139   Other Studies Reviewed Today:  Cardiac catheterization 02/23/2015:  Prox LAD-1 lesion, 75% stenosed.  Prox LAD-2 lesion, 75% stenosed. Post intervention, there is a 0% residual stenosis. The lesion was previously treated with a stent (unknown type).  Ramus lesion, 30% stenosed.  Prox Cx lesion, 30% stenosed.  Mid RCA lesion, 25% stenosed.  1. Single vessel CAD involving the proximal LAD at the proximal stent edge of the previously implanted DES 2. Successful PCI with a DES platform 3. Known normal LV function by noninvasive assessment  DAPT with ASA and brilinta x 12 months or per Twilight Protocol if enrolled.  Assessment and Plan:  1. CAD status post DES to the LAD in January. She will continue on aspirin and Brilinta, otherwise no changes to cardiac medical regimen.  2. Essential hypertension, blood pressure is adequately controlled today.  3. PSVT, continue beta blocker and observation.  Current medicines were reviewed with the patient today.  Disposition: FU with me in 6 months.   Signed, Satira Sark, MD, [facility] 07/26/2015 11:35 AM    [facility] at [place], [place], [place] [address] Phone: [phone]; Fax: [phone] ov

## 2015-08-04 ENCOUNTER — Other Ambulatory Visit: Payer: Self-pay | Admitting: *Deleted

## 2015-08-04 MED ORDER — TICAGRELOR 90 MG PO TABS: 90.0000 mg | ORAL_TABLET | Freq: Two times a day (BID) | ORAL | Status: DC

## 2015-08-09 ENCOUNTER — Encounter: Payer: Self-pay | Admitting: *Deleted

## 2015-09-20 ENCOUNTER — Other Ambulatory Visit: Payer: Self-pay | Admitting: *Deleted

## 2015-09-20 MED ORDER — TICAGRELOR 90 MG PO TABS: 90.0000 mg | ORAL_TABLET | Freq: Two times a day (BID) | ORAL | 0 refills | Status: DC

## 2015-11-26 ENCOUNTER — Telehealth: Payer: Self-pay | Admitting: Cardiology

## 2015-11-26 MED ORDER — TICAGRELOR 90 MG PO TABS: 90.0000 mg | ORAL_TABLET | Freq: Two times a day (BID) | ORAL | 3 refills | Status: DC

## 2015-11-26 NOTE — Telephone Encounter (Signed)
Brilinta sent to pharmacy pt aware and says she didn't need samples would pick up refills.

## 2015-11-26 NOTE — Telephone Encounter (Signed)
[name] called stating that she tried to get her Brilinta 90 mg refilled and was told that she has no refills. Wanted to know if we can provide samples.

## 2015-12-29 NOTE — Progress Notes (Signed)
Cardiology Office Note  Date: 12/30/2015   ID: [name], DOB August 05, 1940, MRN [id number]  PCP: Julie Graves, DO  Primary Cardiologist: Julie Lesches, MD   Chief Complaint  Patient presents with  . Coronary Artery Disease    History of Present Illness: [name] is a 75 y.o. female last seen in June. She is referred back to the office by Dr. Melina Copa, was seen recently at an urgent care visit with reports of progressive chest tightness and shortness of breath. She states that she has been feeling these symptoms over the last 2 months, progressive, has prolonged episodes of chest tightness, but this seems to be worse in the evenings. She has had improvement in symptoms with nitroglycerin, somewhat with inhalers. She states that her symptoms feel like what she was experiencing prior to her last stent intervention.  I reviewed her ECG today which shows sinus rhythm with occasional PACs and borderline low voltage. No acute ST segment changes. Current cardiac regimen includes aspirin, Lopressor, omega-3 supplements, Brilinta, Zocor, Maxide, and as needed nitroglycerin.  Last intervention was DES to the LAD in January of this year, she has been in the [place] study.  Past Medical History:  Diagnosis Date  . Arthritis   . Bulging lumbar disc   . Chronic lower back pain   . Coronary atherosclerosis of native coronary artery    DES LAD 1/10  . Depression   . Dyslipidemia   . Essential hypertension, benign   . Frequent headaches   . Glaucoma   . Refusal of blood transfusions as patient is Jehovah's Witness   . Type 2 diabetes mellitus (HCC)    Borderline    Past Surgical History:  Procedure Laterality Date  . CARDIAC CATHETERIZATION N/A 02/23/2015   Procedure: Left Heart Cath and Coronary Angiography;  Surgeon: Sherren Mocha, MD;  Location: [place] [facility];  Service: Cardiovascular;  Laterality: N/A;  . CARDIAC CATHETERIZATION N/A 02/23/2015   Procedure: Coronary Stent  Intervention;  Surgeon: Sherren Mocha, MD;  Location: [place] [facility];  Service: Cardiovascular;  Laterality: N/A;  . CORONARY ANGIOPLASTY  02/23/2015  . CORONARY ANGIOPLASTY WITH STENT PLACEMENT  2009  . DILATION AND CURETTAGE OF UTERUS    . TUBAL LIGATION      Current Outpatient Prescriptions  Medication Sig Dispense Refill  . Ascorbic Acid (VITAMIN C) 500 MG tablet Take 500 mg by mouth daily.      Marland Kitchen aspirin 81 MG tablet Take 1 tablet (81 mg total) by mouth daily.    . bimatoprost (LUMIGAN) 0.01 % SOLN Place 1 drop into both eyes at bedtime.    . Calcium Carbonate Antacid (TUMS PO) Take 2 tablets by mouth as needed.     . Cholecalciferol (VITAMIN D) 400 UNITS capsule Take 400 Units by mouth daily.      . Cyanocobalamin (VITAMIN B 12 PO) Take 1 tablet by mouth daily.    . Garlic 123XX123 MG TABS Take 1 tablet by mouth daily.      Marland Kitchen HYDROcodone-acetaminophen (NORCO/VICODIN) 5-325 MG per tablet Take 1 tablet by mouth every 6 (six) hours as needed for pain.    Marland Kitchen MAGNESIUM CARBONATE PO Take 1 tablet by mouth daily.    . meloxicam (MOBIC) 7.5 MG tablet Take 7.5 mg by mouth daily.    . metoprolol (LOPRESSOR) 50 MG tablet Take 1 tablet (50 mg total) by mouth 2 (two) times daily. 180 tablet 3  . nitroGLYCERIN (NITROSTAT) 0.4 MG SL  tablet Place 0.4 mg under the tongue every 5 (five) minutes as needed for chest pain.    . Omega-3 Fatty Acids (FISH OIL) 1000 MG CAPS Take 1 capsule by mouth 2 (two) times daily.      . pantoprazole (PROTONIX) 40 MG tablet Take 1 tablet (40 mg total) by mouth daily. 30 tablet 11  . potassium chloride (K-DUR) 10 MEQ tablet Take 1 tablet (10 mEq total) by mouth daily. 90 tablet 3  . simvastatin (ZOCOR) 40 MG tablet Take 40 mg by mouth at bedtime.      . ticagrelor (BRILINTA) 90 MG TABS tablet Take 1 tablet (90 mg total) by mouth 2 (two) times daily. 180 tablet 3  . traZODone (DESYREL) 50 MG tablet Take 1 tablet (50 mg total) by mouth at bedtime. 30 tablet 0  .  triamterene-hydrochlorothiazide (MAXZIDE-25) 37.5-25 MG per tablet Take 0.5 tablets by mouth daily.    Marland Kitchen zolpidem (AMBIEN) 10 MG tablet Take 10 mg by mouth at bedtime as needed for sleep.     No current facility-administered medications for this visit.    Allergies:  Patient has no known allergies.   Social History: The patient  reports that she has never smoked. She has never used smokeless tobacco. She reports that she does not drink alcohol or use drugs.   Family History: The patient's family history includes Cancer in her father; Heart attack in her mother.   ROS:  Please see the history of present illness. Otherwise, complete review of systems is positive for fatigue, mild intermittent cough.  All other systems are reviewed and negative.   Physical Exam: VS:  BP (!) 144/66   Pulse 83   Ht 5\' 5"  (1.651 m)   Wt 184 lb (83.5 kg)   SpO2 98%   BMI 30.62 kg/m , BMI Body mass index is 30.62 kg/m.  Wt Readings from Last 3 Encounters:  12/30/15 184 lb (83.5 kg)  07/26/15 183 lb (83 kg)  04/26/15 185 lb 9.6 oz (84.2 kg)    Gen.: Patient appears comfortable at rest.  HEENT: Conjunctiva and lids normal, oropharynx clear.  Neck: Supple, no elevated JVP or carotid bruits, no thyromegaly.  Lungs: Clear to auscultation, nonlabored breathing at rest.  Cardiac: Regular rate and rhythm, no S3 or significant systolic murmur, no pericardial rub.  Abdomen: Soft, nontender, bowel sounds present.  Extremities: No pitting edema, distal pulses 2+. Skin: Warm and dry. Musculoskeletal: No kyphosis. Neuropsychiatric: Alert and oriented 3, affect appropriate.  ECG: I personally reviewed the tracing from 02/24/2015 which showed normal sinus rhythm.  Recent Labwork: 02/24/2015: BUN 13; Creatinine, Ser 0.83; Hemoglobin 11.7; Platelets 190; Potassium 4.0; Sodium 139  October 2017: Hemoglobin 12.9, platelets 213, BUN 12, creatinine 0.7, AST 17, ALT 14, potassium 4.7, hemoglobin A1c 7.3, cholesterol  227, triglycerides 141, HDL 44, LDL 154, TSH 0.88  Other Studies Reviewed Today:  Cardiac catheterization 02/23/2015:  Prox LAD-1 lesion, 75% stenosed.  Prox LAD-2 lesion, 75% stenosed. Post intervention, there is a 0% residual stenosis. The lesion was previously treated with a stent (unknown type).  Ramus lesion, 30% stenosed.  Prox Cx lesion, 30% stenosed.  Mid RCA lesion, 25% stenosed.  1. Single vessel CAD involving the proximal LAD at the proximal stent edge of the previously implanted DES 2. Successful PCI with a DES platform 3. Known normal LV function by noninvasive assessment  DAPT with ASA and brilinta x 12 months or per Twilight Protocol if enrolled.  Assessment and Plan:  1. Chest tightness and shortness of breath concerning for accelerating angina, patient states feels similar to prior symptoms around the time of her last stent intervention in January of this year. She reports compliance with her medications including aspirin and Brilinta. Has been taking more nitroglycerin recently. Plan is to proceed with a diagnostic cardiac catheterization for reevaluation of coronary anatomy and stent patency. Risks and benefits discussed, she is in agreement to proceed. Samples provided for Brilinta.  2. CAD status post DES to the LAD in 2010, and more recently DES to the LAD at the proximal stent edge of the previously implanted stent in January of this year. LVEF normal range.  3. Essential hypertension, no changes made to antihypertensive regimen.  4. Hyperlipidemia, on Zocor and omega-3 supplements.  Current medicines were reviewed with the patient today.   Orders Placed This Encounter  Procedures  . EKG 12-Lead    Disposition: Follow-up with me after cardiac catheterization.  Signed, Satira Sark, MD, [facility] 12/30/2015 11:49 AM    [facility] at [place], [place], [place] [address] Phone: [phone]; Fax: [phone]

## 2015-12-30 ENCOUNTER — Ambulatory Visit (INDEPENDENT_AMBULATORY_CARE_PROVIDER_SITE_OTHER): Payer: Medicare HMO | Admitting: Cardiology

## 2015-12-30 ENCOUNTER — Other Ambulatory Visit: Payer: Self-pay | Admitting: Cardiology

## 2015-12-30 ENCOUNTER — Encounter: Payer: Self-pay | Admitting: Cardiology

## 2015-12-30 ENCOUNTER — Telehealth: Payer: Self-pay | Admitting: Cardiology

## 2015-12-30 ENCOUNTER — Encounter: Payer: Self-pay | Admitting: *Deleted

## 2015-12-30 VITALS — BP 144/66 | HR 83 | Ht 65.0 in | Wt 184.0 lb

## 2015-12-30 DIAGNOSIS — I10 Essential (primary) hypertension: Secondary | ICD-10-CM

## 2015-12-30 DIAGNOSIS — I200 Unstable angina: Secondary | ICD-10-CM

## 2015-12-30 DIAGNOSIS — I25119 Atherosclerotic heart disease of native coronary artery with unspecified angina pectoris: Secondary | ICD-10-CM

## 2015-12-30 DIAGNOSIS — E782 Mixed hyperlipidemia: Secondary | ICD-10-CM | POA: Diagnosis not present

## 2015-12-30 NOTE — Telephone Encounter (Signed)
   Left heart cath - Tuesday, 11/14 - 10:30 - [name]  Checking percert

## 2015-12-30 NOTE — Patient Instructions (Signed)
Medication Instructions:  Continue all current medications.  Labwork: none  Testing/Procedures: Your physician has requested that you have a cardiac catheterization. Cardiac catheterization is used to diagnose and/or treat various heart conditions. Doctors may recommend this procedure for a number of different reasons. The most common reason is to evaluate chest pain. Chest pain can be a symptom of coronary artery disease (CAD), and cardiac catheterization can show whether plaque is narrowing or blocking your heart's arteries. This procedure is also used to evaluate the valves, as well as measure the blood flow and oxygen levels in different parts of your heart. For further information please visit www.cardiosmart.org. Please follow instruction sheet, as given.  Follow-Up: 1 month   Any Other Special Instructions Will Be Listed Below (If Applicable).  If you need a refill on your cardiac medications before your next appointment, please call your pharmacy.  

## 2016-01-04 ENCOUNTER — Encounter (HOSPITAL_COMMUNITY)
Admission: RE | Disposition: A | Discharge status: Home / Self Care | Payer: Self-pay | Source: Ambulatory Visit | Attending: Cardiology

## 2016-01-04 ENCOUNTER — Ambulatory Visit (HOSPITAL_COMMUNITY)
Admission: RE | Admit: 2016-01-04 | Discharge: 2016-01-04 | Disposition: A | Discharge status: Home / Self Care | Payer: Medicare HMO | Source: Ambulatory Visit | Attending: Cardiology | Admitting: Cardiology

## 2016-01-04 DIAGNOSIS — M5126 Other intervertebral disc displacement, lumbar region: Secondary | ICD-10-CM | POA: Diagnosis not present

## 2016-01-04 DIAGNOSIS — Z9861 Coronary angioplasty status: Secondary | ICD-10-CM | POA: Insufficient documentation

## 2016-01-04 DIAGNOSIS — G8929 Other chronic pain: Secondary | ICD-10-CM | POA: Insufficient documentation

## 2016-01-04 DIAGNOSIS — I25110 Atherosclerotic heart disease of native coronary artery with unstable angina pectoris: Secondary | ICD-10-CM | POA: Diagnosis not present

## 2016-01-04 DIAGNOSIS — I200 Unstable angina: Secondary | ICD-10-CM | POA: Diagnosis present

## 2016-01-04 DIAGNOSIS — E782 Mixed hyperlipidemia: Secondary | ICD-10-CM | POA: Diagnosis present

## 2016-01-04 DIAGNOSIS — Z7982 Long term (current) use of aspirin: Secondary | ICD-10-CM | POA: Insufficient documentation

## 2016-01-04 DIAGNOSIS — R0602 Shortness of breath: Secondary | ICD-10-CM | POA: Insufficient documentation

## 2016-01-04 DIAGNOSIS — E785 Hyperlipidemia, unspecified: Secondary | ICD-10-CM | POA: Diagnosis not present

## 2016-01-04 DIAGNOSIS — H409 Unspecified glaucoma: Secondary | ICD-10-CM | POA: Diagnosis not present

## 2016-01-04 DIAGNOSIS — I152 Hypertension secondary to endocrine disorders: Secondary | ICD-10-CM | POA: Diagnosis present

## 2016-01-04 DIAGNOSIS — R079 Chest pain, unspecified: Secondary | ICD-10-CM | POA: Diagnosis present

## 2016-01-04 DIAGNOSIS — I10 Essential (primary) hypertension: Secondary | ICD-10-CM | POA: Insufficient documentation

## 2016-01-04 DIAGNOSIS — M1990 Unspecified osteoarthritis, unspecified site: Secondary | ICD-10-CM | POA: Diagnosis not present

## 2016-01-04 DIAGNOSIS — Z7901 Long term (current) use of anticoagulants: Secondary | ICD-10-CM | POA: Diagnosis not present

## 2016-01-04 DIAGNOSIS — F329 Major depressive disorder, single episode, unspecified: Secondary | ICD-10-CM | POA: Insufficient documentation

## 2016-01-04 DIAGNOSIS — R7303 Prediabetes: Secondary | ICD-10-CM | POA: Diagnosis not present

## 2016-01-04 DIAGNOSIS — E1159 Type 2 diabetes mellitus with other circulatory complications: Secondary | ICD-10-CM | POA: Diagnosis present

## 2016-01-04 DIAGNOSIS — E1169 Type 2 diabetes mellitus with other specified complication: Secondary | ICD-10-CM | POA: Diagnosis present

## 2016-01-04 DIAGNOSIS — I2510 Atherosclerotic heart disease of native coronary artery without angina pectoris: Secondary | ICD-10-CM | POA: Insufficient documentation

## 2016-01-04 DIAGNOSIS — I25119 Atherosclerotic heart disease of native coronary artery with unspecified angina pectoris: Secondary | ICD-10-CM | POA: Diagnosis present

## 2016-01-04 HISTORY — PX: CARDIAC CATHETERIZATION: SHX172

## 2016-01-04 LAB — BASIC METABOLIC PANEL
ANION GAP: 9 (ref 5–15)
BUN: 7 mg/dL (ref 6–20)
CALCIUM: 9.3 mg/dL (ref 8.9–10.3)
CO2: 25 mmol/L (ref 22–32)
Chloride: 103 mmol/L (ref 101–111)
Creatinine, Ser: 0.78 mg/dL (ref 0.44–1.00)
GFR calc Af Amer: >60 mL/min (ref >60)
GFR calc non Af Amer: >60 mL/min (ref >60)
GLUCOSE: 136 mg/dL — AB (ref 65–99)
Potassium: 3.9 mmol/L (ref 3.5–5.1)
Sodium: 137 mmol/L (ref 135–145)

## 2016-01-04 LAB — CBC
HEMATOCRIT: 35.7 % — AB (ref 36.0–46.0)
HEMOGLOBIN: 11.6 g/dL — AB (ref 12.0–15.0)
MCH: 28.5 pg (ref 26.0–34.0)
MCHC: 32.5 g/dL (ref 30.0–36.0)
MCV: 87.7 fL (ref 78.0–100.0)
Platelets: 180 10*3/uL (ref 150–400)
RBC: 4.07 MIL/uL (ref 3.87–5.11)
RDW: 12.7 % (ref 11.5–15.5)
WBC: 5.3 10*3/uL (ref 4.0–10.5)

## 2016-01-04 LAB — PROTIME-INR
INR: 1.08
PROTHROMBIN TIME: 14 s (ref 11.4–15.2)

## 2016-01-04 LAB — GLUCOSE, CAPILLARY: Glucose-Capillary: 149 mg/dL — ABNORMAL HIGH (ref 65–99)

## 2016-01-04 LAB — NO BLOOD PRODUCTS

## 2016-01-04 SURGERY — LEFT HEART CATH AND CORONARY ANGIOGRAPHY
Anesthesia: LOCAL

## 2016-01-04 MED ORDER — SODIUM CHLORIDE 0.9% FLUSH: 3.0000 mL | Freq: Two times a day (BID) | INTRAVENOUS | Status: DC

## 2016-01-04 MED ORDER — ASPIRIN 81 MG PO CHEW: 81.0000 mg | CHEWABLE_TABLET | ORAL | Status: DC

## 2016-01-04 MED ORDER — SODIUM CHLORIDE 0.9% FLUSH: 3.0000 mL | INTRAVENOUS | Status: DC | PRN

## 2016-01-04 MED ORDER — LIDOCAINE HCL (PF) 1 % IJ SOLN
INTRAMUSCULAR | Status: AC
  Filled 2016-01-04: qty 30

## 2016-01-04 MED ORDER — MIDAZOLAM HCL 2 MG/2ML IJ SOLN
INTRAMUSCULAR | Status: AC
  Filled 2016-01-04: qty 2

## 2016-01-04 MED ORDER — VERAPAMIL HCL 2.5 MG/ML IV SOLN
INTRAVENOUS | Status: DC | PRN
  Administered 2016-01-04: 10 mL via INTRA_ARTERIAL

## 2016-01-04 MED ORDER — IOPAMIDOL (ISOVUE-370) INJECTION 76%
INTRAVENOUS | Status: AC
  Filled 2016-01-04: qty 100

## 2016-01-04 MED ORDER — LIDOCAINE HCL (PF) 1 % IJ SOLN
INTRAMUSCULAR | Status: DC | PRN
  Administered 2016-01-04: 2 mL via INTRADERMAL

## 2016-01-04 MED ORDER — HEPARIN SODIUM (PORCINE) 1000 UNIT/ML IJ SOLN
INTRAMUSCULAR | Status: AC
Start: 2016-01-04 — End: 2016-01-04
  Filled 2016-01-04: qty 1

## 2016-01-04 MED ORDER — SODIUM CHLORIDE 0.9 % IV SOLN: 250.0000 mL | INTRAVENOUS | Status: DC | PRN

## 2016-01-04 MED ORDER — HEPARIN SODIUM (PORCINE) 1000 UNIT/ML IJ SOLN
INTRAMUSCULAR | Status: DC | PRN
  Administered 2016-01-04: 4500 [IU] via INTRAVENOUS

## 2016-01-04 MED ORDER — HEPARIN SODIUM (PORCINE) 1000 UNIT/ML IJ SOLN
INTRAMUSCULAR | Status: AC
  Filled 2016-01-04: qty 1

## 2016-01-04 MED ORDER — VERAPAMIL HCL 2.5 MG/ML IV SOLN
INTRAVENOUS | Status: AC
  Filled 2016-01-04: qty 2

## 2016-01-04 MED ORDER — SODIUM CHLORIDE 0.9 % IV SOLN
INTRAVENOUS | Status: DC
  Administered 2016-01-04: 11:00:00 via INTRAVENOUS

## 2016-01-04 MED ORDER — MIDAZOLAM HCL 2 MG/2ML IJ SOLN
INTRAMUSCULAR | Status: DC | PRN
  Administered 2016-01-04: 1 mg via INTRAVENOUS

## 2016-01-04 MED ORDER — FENTANYL CITRATE (PF) 100 MCG/2ML IJ SOLN
INTRAMUSCULAR | Status: AC
  Filled 2016-01-04: qty 2

## 2016-01-04 MED ORDER — HEPARIN (PORCINE) IN NACL 2-0.9 UNIT/ML-% IJ SOLN
INTRAMUSCULAR | Status: DC | PRN
  Administered 2016-01-04: 1500 mL

## 2016-01-04 MED ORDER — IOPAMIDOL (ISOVUE-370) INJECTION 76%
INTRAVENOUS | Status: DC | PRN
  Administered 2016-01-04: 55 mL via INTRA_ARTERIAL

## 2016-01-04 MED ORDER — FENTANYL CITRATE (PF) 100 MCG/2ML IJ SOLN
INTRAMUSCULAR | Status: DC | PRN
  Administered 2016-01-04: 25 ug via INTRAVENOUS

## 2016-01-04 MED ORDER — SODIUM CHLORIDE 0.9 % WEIGHT BASED INFUSION: 1.0000 mL/kg/h | INTRAVENOUS | Status: DC

## 2016-01-04 SURGICAL SUPPLY — 9 items
CATH 5FR JL3.5 JR4 ANG PIG MP (CATHETERS) ×1 IMPLANT
GLIDESHEATH SLEND SS 6F .021 (SHEATH) ×1 IMPLANT
GUIDEWIRE INQWIRE 1.5J.035X260 (WIRE) IMPLANT
INQWIRE 1.5J .035X260CM (WIRE) ×2
KIT HEART LEFT (KITS) ×2 IMPLANT
PACK CARDIAC CATHETERIZATION (CUSTOM PROCEDURE TRAY) ×2 IMPLANT
SYR MEDRAD MARK V 150ML (SYRINGE) ×2 IMPLANT
TRANSDUCER W/STOPCOCK (MISCELLANEOUS) ×2 IMPLANT
TUBING CIL FLEX 10 FLL-RA (TUBING) ×2 IMPLANT

## 2016-01-04 NOTE — Interval H&P Note (Signed)
History and Physical Interval Note:  01/04/2016 11:08 AM  [name]  has presented today for surgery, with the diagnosis of angina  The various methods of treatment have been discussed with the patient and family. After consideration of risks, benefits and other options for treatment, the patient has consented to  Procedure(s): Left Heart Cath and Coronary Angiography (N/A) as a surgical intervention .  The patient's history has been reviewed, patient examined, no change in status, stable for surgery.  I have reviewed the patient's chart and labs.  Questions were answered to the patient's satisfaction.   Cath Lab Visit (complete for each Cath Lab visit)  Clinical Evaluation Leading to the Procedure:   ACS: No.  Non-ACS:    Anginal Classification: CCS III  Anti-ischemic medical therapy: Maximal Therapy (2 or more classes of medications)  Non-Invasive Test Results: No non-invasive testing performed  Prior CABG: No previous CABG        Collier Salina [facility] 01/04/2016 11:08 AM

## 2016-01-04 NOTE — Discharge Instructions (Signed)

## 2016-01-04 NOTE — H&P (View-Only) (Signed)
Cardiology Office Note  Date: 12/30/2015   ID: [name], DOB [birth year], MRN [id number]  PCP: Octavio Graves, DO  Primary Cardiologist: Rozann Lesches, MD   Chief Complaint  Patient presents with  . Coronary Artery Disease    History of Present Illness: [name] is a 75 y.o. female last seen in June. She is referred back to the office by Dr. Melina Copa, was seen recently at an urgent care visit with reports of progressive chest tightness and shortness of breath. She states that she has been feeling these symptoms over the last 2 months, progressive, has prolonged episodes of chest tightness, but this seems to be worse in the evenings. She has had improvement in symptoms with nitroglycerin, somewhat with inhalers. She states that her symptoms feel like what she was experiencing prior to her last stent intervention.  I reviewed her ECG today which shows sinus rhythm with occasional PACs and borderline low voltage. No acute ST segment changes. Current cardiac regimen includes aspirin, Lopressor, omega-3 supplements, Brilinta, Zocor, Maxide, and as needed nitroglycerin.  Last intervention was DES to the LAD in January of this year, she has been in the [place] study.  Past Medical History:  Diagnosis Date  . Arthritis   . Bulging lumbar disc   . Chronic lower back pain   . Coronary atherosclerosis of native coronary artery    DES LAD 1/10  . Depression   . Dyslipidemia   . Essential hypertension, benign   . Frequent headaches   . Glaucoma   . Refusal of blood transfusions as patient is Jehovah's Witness   . Type 2 diabetes mellitus (HCC)    Borderline    Past Surgical History:  Procedure Laterality Date  . CARDIAC CATHETERIZATION N/A 02/23/2015   Procedure: Left Heart Cath and Coronary Angiography;  Surgeon: Sherren Mocha, MD;  Location: [facility];  Service: Cardiovascular;  Laterality: N/A;  . CARDIAC CATHETERIZATION N/A 02/23/2015   Procedure: Coronary Stent  Intervention;  Surgeon: Sherren Mocha, MD;  Location: [facility];  Service: Cardiovascular;  Laterality: N/A;  . CORONARY ANGIOPLASTY  02/23/2015  . CORONARY ANGIOPLASTY WITH STENT PLACEMENT  2009  . DILATION AND CURETTAGE OF UTERUS    . TUBAL LIGATION      Current Outpatient Prescriptions  Medication Sig Dispense Refill  . Ascorbic Acid (VITAMIN C) 500 MG tablet Take 500 mg by mouth daily.      Marland Kitchen aspirin 81 MG tablet Take 1 tablet (81 mg total) by mouth daily.    . bimatoprost (LUMIGAN) 0.01 % SOLN Place 1 drop into both eyes at bedtime.    . Calcium Carbonate Antacid (TUMS PO) Take 2 tablets by mouth as needed.     . Cholecalciferol (VITAMIN D) 400 UNITS capsule Take 400 Units by mouth daily.      . Cyanocobalamin (VITAMIN B 12 PO) Take 1 tablet by mouth daily.    . Garlic 123XX123 MG TABS Take 1 tablet by mouth daily.      Marland Kitchen HYDROcodone-acetaminophen (NORCO/VICODIN) 5-325 MG per tablet Take 1 tablet by mouth every 6 (six) hours as needed for pain.    Marland Kitchen MAGNESIUM CARBONATE PO Take 1 tablet by mouth daily.    . meloxicam (MOBIC) 7.5 MG tablet Take 7.5 mg by mouth daily.    . metoprolol (LOPRESSOR) 50 MG tablet Take 1 tablet (50 mg total) by mouth 2 (two) times daily. 180 tablet 3  . nitroGLYCERIN (NITROSTAT) 0.4 MG SL  tablet Place 0.4 mg under the tongue every 5 (five) minutes as needed for chest pain.    . Omega-3 Fatty Acids (FISH OIL) 1000 MG CAPS Take 1 capsule by mouth 2 (two) times daily.      . pantoprazole (PROTONIX) 40 MG tablet Take 1 tablet (40 mg total) by mouth daily. 30 tablet 11  . potassium chloride (K-DUR) 10 MEQ tablet Take 1 tablet (10 mEq total) by mouth daily. 90 tablet 3  . simvastatin (ZOCOR) 40 MG tablet Take 40 mg by mouth at bedtime.      . ticagrelor (BRILINTA) 90 MG TABS tablet Take 1 tablet (90 mg total) by mouth 2 (two) times daily. 180 tablet 3  . traZODone (DESYREL) 50 MG tablet Take 1 tablet (50 mg total) by mouth at bedtime. 30 tablet 0  .  triamterene-hydrochlorothiazide (MAXZIDE-25) 37.5-25 MG per tablet Take 0.5 tablets by mouth daily.    Marland Kitchen zolpidem (AMBIEN) 10 MG tablet Take 10 mg by mouth at bedtime as needed for sleep.     No current facility-administered medications for this visit.    Allergies:  Patient has no known allergies.   Social History: The patient  reports that she has never smoked. She has never used smokeless tobacco. She reports that she does not drink alcohol or use drugs.   Family History: The patient's family history includes Cancer in her father; Heart attack in her mother.   ROS:  Please see the history of present illness. Otherwise, complete review of systems is positive for fatigue, mild intermittent cough.  All other systems are reviewed and negative.   Physical Exam: VS:  BP (!) 144/66   Pulse 83   Ht 5\' 5"  (1.651 m)   Wt 184 lb (83.5 kg)   SpO2 98%   BMI 30.62 kg/m , BMI Body mass index is 30.62 kg/m.  Wt Readings from Last 3 Encounters:  12/30/15 184 lb (83.5 kg)  07/26/15 183 lb (83 kg)  04/26/15 185 lb 9.6 oz (84.2 kg)    Gen.: Patient appears comfortable at rest.  HEENT: Conjunctiva and lids normal, oropharynx clear.  Neck: Supple, no elevated JVP or carotid bruits, no thyromegaly.  Lungs: Clear to auscultation, nonlabored breathing at rest.  Cardiac: Regular rate and rhythm, no S3 or significant systolic murmur, no pericardial rub.  Abdomen: Soft, nontender, bowel sounds present.  Extremities: No pitting edema, distal pulses 2+. Skin: Warm and dry. Musculoskeletal: No kyphosis. Neuropsychiatric: Alert and oriented 3, affect appropriate.  ECG: I personally reviewed the tracing from 02/24/2015 which showed normal sinus rhythm.  Recent Labwork: 02/24/2015: BUN 13; Creatinine, Ser 0.83; Hemoglobin 11.7; Platelets 190; Potassium 4.0; Sodium 139  October 2017: Hemoglobin 12.9, platelets 213, BUN 12, creatinine 0.7, AST 17, ALT 14, potassium 4.7, hemoglobin A1c 7.3, cholesterol  227, triglycerides 141, HDL 44, LDL 154, TSH 0.88  Other Studies Reviewed Today:  Cardiac catheterization 02/23/2015:  Prox LAD-1 lesion, 75% stenosed.  Prox LAD-2 lesion, 75% stenosed. Post intervention, there is a 0% residual stenosis. The lesion was previously treated with a stent (unknown type).  Ramus lesion, 30% stenosed.  Prox Cx lesion, 30% stenosed.  Mid RCA lesion, 25% stenosed.  1. Single vessel CAD involving the proximal LAD at the proximal stent edge of the previously implanted DES 2. Successful PCI with a DES platform 3. Known normal LV function by noninvasive assessment  DAPT with ASA and brilinta x 12 months or per [place] Protocol if enrolled.  Assessment and Plan:  1. Chest tightness and shortness of breath concerning for accelerating angina, patient states feels similar to prior symptoms around the time of her last stent intervention in January of this year. She reports compliance with her medications including aspirin and Brilinta. Has been taking more nitroglycerin recently. Plan is to proceed with a diagnostic cardiac catheterization for reevaluation of coronary anatomy and stent patency. Risks and benefits discussed, she is in agreement to proceed. Samples provided for Brilinta.  2. CAD status post DES to the LAD in 2010, and more recently DES to the LAD at the proximal stent edge of the previously implanted stent in January of this year. LVEF normal range.  3. Essential hypertension, no changes made to antihypertensive regimen.  4. Hyperlipidemia, on Zocor and omega-3 supplements.  Current medicines were reviewed with the patient today.   Orders Placed This Encounter  Procedures  . EKG 12-Lead    Disposition: Follow-up with me after cardiac catheterization.  Signed, Satira Sark, MD, [facility] 12/30/2015 11:49 AM    [facility] at [place], [place],  [address] Phone: [phone]; Fax: [phone]

## 2016-01-05 ENCOUNTER — Telehealth: Payer: Self-pay | Admitting: Cardiology

## 2016-01-05 ENCOUNTER — Encounter (HOSPITAL_COMMUNITY): Payer: Self-pay | Admitting: Cardiology

## 2016-01-05 MED FILL — Heparin Sodium (Porcine) Inj 1000 Unit/ML: INTRAMUSCULAR | Qty: 30 | Status: AC

## 2016-01-05 NOTE — Telephone Encounter (Signed)
[name] called stating that she had her cath on 01-04-16. She has upcoming appointment on 12/1 with Dr. Domenic Polite. She is wanting to know if Dr. Domenic Polite feels like she needs to be seen sooner than the December appointment.

## 2016-01-05 NOTE — Telephone Encounter (Signed)
Patient advised that her appointment with Dr. Domenic Polite on 01/24/16 is okay as long as she is not having any problems. Patient confirmed that she was not having any symptoms and verbalized understanding.

## 2016-01-17 ENCOUNTER — Telehealth: Payer: Self-pay | Admitting: Cardiology

## 2016-01-17 MED ORDER — TICAGRELOR 90 MG PO TABS: 90.0000 mg | ORAL_TABLET | Freq: Two times a day (BID) | ORAL | 0 refills | Status: DC

## 2016-01-17 NOTE — Telephone Encounter (Signed)
Message left with daughter-in-law Tillie Rung) that samples are ready for pick up.

## 2016-01-17 NOTE — Telephone Encounter (Signed)
Patient is asking for samples of Brilinta 90mg 

## 2016-01-24 ENCOUNTER — Encounter: Payer: Self-pay | Admitting: Cardiology

## 2016-01-24 ENCOUNTER — Ambulatory Visit (INDEPENDENT_AMBULATORY_CARE_PROVIDER_SITE_OTHER): Payer: Medicare HMO | Admitting: Cardiology

## 2016-01-24 VITALS — BP 140/60 | HR 60 | Ht 65.0 in | Wt 182.4 lb

## 2016-01-24 DIAGNOSIS — I10 Essential (primary) hypertension: Secondary | ICD-10-CM | POA: Diagnosis not present

## 2016-01-24 DIAGNOSIS — I471 Supraventricular tachycardia: Secondary | ICD-10-CM

## 2016-01-24 DIAGNOSIS — I25119 Atherosclerotic heart disease of native coronary artery with unspecified angina pectoris: Secondary | ICD-10-CM

## 2016-01-24 DIAGNOSIS — E782 Mixed hyperlipidemia: Secondary | ICD-10-CM | POA: Diagnosis not present

## 2016-01-24 MED ORDER — AMLODIPINE BESYLATE 2.5 MG PO TABS: 2.5000 mg | ORAL_TABLET | Freq: Every day | ORAL | 3 refills | Status: DC

## 2016-01-24 NOTE — Patient Instructions (Signed)
Your physician recommends that you schedule a follow-up appointment in: 3 Months with Dr. Domenic Polite in [place] has recommended you make the following change in your medication:  Start Norvasc 2.5 mg Daily   If you need a refill on your cardiac medications before your next appointment, please call your pharmacy.  Thank you for choosing [facility]!

## 2016-01-24 NOTE — Progress Notes (Signed)
Cardiology Office Note  Date: 01/24/2016   ID: MODUPE HURTADO, DOB 03-09-1940, MRN [id number]  PCP: Octavio Graves, DO  Primary Cardiologist: Rozann Lesches, MD   Chief Complaint  Patient presents with  . Coronary Artery Disease    History of Present Illness: [name] is a 75 y.o. female seen in November with symptoms suggestive of accelerating angina on medical therapy. She was referred for a cardiac catheterization as outlined below, fortunately demonstrating nonobstructive CAD with patent stent sites within the LAD.  She presents today for a follow-up visit. Continues to have episodes of lightheadedness, sometimes palpitations, and chest discomfort. 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 Heart rate is in the 60s today, well controlled. We have discussed a trial of low-dose Norvasc as an antianginal. In the past she experienced headaches with Imdur.  I reviewed her current medications. She continues on aspirin, Brilinta, metoprolol, Maxide, and as needed nitroglycerin. DES intervention to the LAD most recently was in early January 2017.  Past Medical History:  Diagnosis Date  . Arthritis   . Bulging lumbar disc   . Chronic lower back pain   . Coronary atherosclerosis of native coronary artery    DES LAD 1/10  . Depression   . Dyslipidemia   . Essential hypertension, benign   . Frequent headaches   . Glaucoma   . Refusal of blood transfusions as patient is Jehovah's Witness   . Type 2 diabetes mellitus (HCC)    Borderline    Past Surgical History:  Procedure Laterality Date  . CARDIAC CATHETERIZATION N/A 02/23/2015   Procedure: Left Heart Cath and Coronary Angiography;  Surgeon: Sherren Mocha, MD;  Location: [facility];  Service: Cardiovascular;  Laterality: N/A;  . CARDIAC CATHETERIZATION N/A 02/23/2015   Procedure: Coronary Stent Intervention;  Surgeon: Sherren Mocha, MD;  Location: [facility];  Service:  Cardiovascular;  Laterality: N/A;  . CARDIAC CATHETERIZATION N/A 01/04/2016   Procedure: Left Heart Cath and Coronary Angiography;  Surgeon: Peter M Martinique, MD;  Location: [facility];  Service: Cardiovascular;  Laterality: N/A;  . CORONARY ANGIOPLASTY  02/23/2015  . CORONARY ANGIOPLASTY WITH STENT PLACEMENT  2009  . DILATION AND CURETTAGE OF UTERUS    . TUBAL LIGATION      Current Outpatient Prescriptions  Medication Sig Dispense Refill  . Ascorbic Acid (VITAMIN C) 500 MG tablet Take 500 mg by mouth daily.      Marland Kitchen aspirin EC 81 MG tablet Take 81 mg by mouth every evening.    . bimatoprost (LUMIGAN) 0.01 % SOLN Place 1 drop into both eyes at bedtime.    . Calcium Carbonate Antacid (TUMS PO) Take 2 tablets by mouth 4 (four) times daily as needed (for acid reflux/indigestion).     . Cholecalciferol (VITAMIN D) 400 UNITS capsule Take 400 Units by mouth daily.      . Cyanocobalamin (VITAMIN B 12 PO) Take 1 tablet by mouth daily.    . Garlic 123XX123 MG TABS Take 1 tablet by mouth daily.      Marland Kitchen HYDROcodone-acetaminophen (NORCO) 10-325 MG tablet Take 1 tablet by mouth 4 (four) times daily as needed. For pain.    Marland Kitchen ibuprofen (ADVIL,MOTRIN) 200 MG tablet Take 600 mg by mouth every 6 (six) hours as needed (for pain.).     Marland Kitchen MAGNESIUM CARBONATE PO Take 1 tablet by mouth daily.    . Menthol, Topical Analgesic, (BIOFREEZE) 4 % GEL  Apply 1 application topically 4 (four) times daily as needed (for pain).     . metoprolol (LOPRESSOR) 50 MG tablet Take 1 tablet (50 mg total) by mouth 2 (two) times daily. 180 tablet 3  . nitroGLYCERIN (NITROSTAT) 0.4 MG SL tablet Place 0.4 mg under the tongue every 5 (five) minutes as needed for chest pain.    . Omega-3 Fatty Acids (FISH OIL) 1000 MG CAPS Take 1 capsule by mouth 2 (two) times daily.      . pantoprazole (PROTONIX) 40 MG tablet Take 1 tablet (40 mg total) by mouth daily. 30 tablet 11  . ticagrelor (BRILINTA) 90 MG TABS tablet Take 1 tablet (90 mg total) by  mouth 2 (two) times daily. 32 tablet 0  . triamterene-hydrochlorothiazide (MAXZIDE-25) 37.5-25 MG per tablet Take 0.5 tablets by mouth daily.    . VENTOLIN HFA 108 (90 Base) MCG/ACT inhaler Inhale 1-2 puffs into the lungs every 4 (four) hours as needed for shortness of breath.    . zolpidem (AMBIEN) 10 MG tablet Take 10 mg by mouth at bedtime.      No current facility-administered medications for this visit.    Allergies:  Patient has no known allergies.   Social History: The patient  reports that she has never smoked. She has never used smokeless tobacco. She reports that she does not drink alcohol or use drugs.   ROS:  Please see the history of present illness. Otherwise, complete review of systems is positive for head congestion intermittently.  All other systems are reviewed and negative.   Physical Exam: VS:  BP 140/60   Pulse 60   Ht 5\' 5"  (1.651 m)   Wt 182 lb 6.4 oz (82.7 kg)   BMI 30.35 kg/m , BMI Body mass index is 30.35 kg/m.  Wt Readings from Last 3 Encounters:  01/24/16 182 lb 6.4 oz (82.7 kg)  01/04/16 184 lb (83.5 kg)  12/30/15 184 lb (83.5 kg)    Gen.: Patient appears comfortable at rest.  HEENT: Conjunctiva and lids normal, oropharynx clear.  Neck: Supple, no elevated JVP or carotid bruits, no thyromegaly.  Lungs: Clear to auscultation, nonlabored breathing at rest.  Cardiac: Regular rate and rhythm, no S3 or significant systolic murmur, no pericardial rub.  Abdomen: Soft, nontender, bowel sounds present.  Extremities: No pitting edema, distal pulses 2+. Skin: Warm and dry. Musculoskeletal: No kyphosis. Neuropsychiatric: Alert and oriented 3, affect appropriate.  ECG: I personally reviewed the tracing from 12/30/2015 which showed sinus rhythm with PACs and borderline low voltage.  Recent Labwork: 01/04/2016: BUN 7; Creatinine, Ser 0.78; Hemoglobin 11.6; Platelets 180; Potassium 3.9; Sodium 137   Other Studies Reviewed Today:  Cardiac catheterization  01/04/2016:  The left ventricular systolic function is normal.  LV end diastolic pressure is moderately elevated.  The left ventricular ejection fraction is 55-65% by visual estimate.  Prox LAD-2 lesion, 0 %stenosed.  A drug eluting .  Prox LAD-1 lesion, 0 %stenosed.  A drug eluting .  Ramus lesion, 30 %stenosed.  Prox Cx lesion, 30 %stenosed.  Mid RCA lesion, 25 %stenosed.  Mid LAD lesion, 40 %stenosed.   1. Nonobstructive CAD. The stents in the LAD are patent 2. Normal LV function 3. Elevated LVEDP  Echocardiogram 02/23/2015: Study Conclusions  - Left ventricle: The cavity size was normal. Systolic function was   vigorous. The estimated ejection fraction was in the range of 65%   to 70%. Wall motion was normal; there were no regional wall   motion  abnormalities. Doppler parameters are consistent with   abnormal left ventricular relaxation (grade 1 diastolic   dysfunction). Doppler parameters are consistent with elevated   ventricular end-diastolic filling pressure. - Aortic valve: Trileaflet; mildly thickened, mildly calcified   leaflets. Transvalvular velocity was within the normal range.   There was no stenosis. There was moderate regurgitation. - Aortic root: The aortic root was normal in size. - Ascending aorta: The ascending aorta was normal in size. - Mitral valve: Calcified annulus. Mildly thickened leaflets . - Left atrium: The atrium was normal in size. - Right ventricle: The cavity size was normal. Wall thickness was   normal. Systolic function was normal. - Right atrium: The atrium was normal in size. - Tricuspid valve: There was mild regurgitation. - Pulmonary arteries: Systolic pressure was mildly increased. PA   peak pressure: 38 mm Hg (S). - Inferior vena cava: The vessel was normal in size. - Pericardium, extracardiac: There was no pericardial effusion.  Assessment and Plan:  1. CAD status post DES intervention to the LAD in 2010 and most  recently in January of this year. Fortunately, recent repeat angiography shows stable disease with patent stent sites, no obstructive lesions to require intervention. We will continue current medical regimen and add Norvasc beginning at 2.5 mg daily as an antianginal. Should be able to stop DAPT as of this coming January.  2. Essential hypertension, continue current regimen.  3. Hyperlipidemia, has been on Zocor and omega-3 supplements.  4. 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  Current medicines were reviewed with the patient today.  Disposition: Follow-up in 3 months.  Signed, Satira Sark, MD, [facility] 01/24/2016 1:32 PM    [facility] at [facility] [address] [place], [place] [address] Phone: [phone]; Fax: [phone]

## 2016-02-09 ENCOUNTER — Other Ambulatory Visit: Payer: Self-pay | Admitting: *Deleted

## 2016-02-09 MED ORDER — TICAGRELOR 90 MG PO TABS: 90.0000 mg | ORAL_TABLET | Freq: Two times a day (BID) | ORAL | 0 refills | Status: DC

## 2016-02-15 ENCOUNTER — Ambulatory Visit: Payer: Medicare HMO | Admitting: Cardiology

## 2016-03-20 ENCOUNTER — Other Ambulatory Visit: Payer: Self-pay | Admitting: *Deleted

## 2016-03-20 MED ORDER — TICAGRELOR 90 MG PO TABS: 90.0000 mg | ORAL_TABLET | Freq: Two times a day (BID) | ORAL | 0 refills | Status: DC

## 2016-04-11 ENCOUNTER — Other Ambulatory Visit: Payer: Self-pay | Admitting: *Deleted

## 2016-04-11 MED ORDER — TICAGRELOR 90 MG PO TABS: 90.0000 mg | ORAL_TABLET | Freq: Two times a day (BID) | ORAL | 6 refills | Status: DC

## 2016-04-11 MED ORDER — TICAGRELOR 90 MG PO TABS: 90.0000 mg | ORAL_TABLET | Freq: Two times a day (BID) | ORAL | 0 refills | Status: DC

## 2016-05-01 ENCOUNTER — Ambulatory Visit (INDEPENDENT_AMBULATORY_CARE_PROVIDER_SITE_OTHER): Payer: Medicare HMO | Admitting: Cardiology

## 2016-05-01 ENCOUNTER — Encounter: Payer: Self-pay | Admitting: Cardiology

## 2016-05-01 VITALS — BP 134/72 | HR 82 | Ht 65.0 in | Wt 185.2 lb

## 2016-05-01 DIAGNOSIS — I25119 Atherosclerotic heart disease of native coronary artery with unspecified angina pectoris: Secondary | ICD-10-CM | POA: Diagnosis not present

## 2016-05-01 DIAGNOSIS — I471 Supraventricular tachycardia: Secondary | ICD-10-CM | POA: Diagnosis not present

## 2016-05-01 DIAGNOSIS — R0602 Shortness of breath: Secondary | ICD-10-CM | POA: Diagnosis not present

## 2016-05-01 DIAGNOSIS — E782 Mixed hyperlipidemia: Secondary | ICD-10-CM

## 2016-05-01 DIAGNOSIS — I10 Essential (primary) hypertension: Secondary | ICD-10-CM

## 2016-05-01 MED ORDER — PANTOPRAZOLE SODIUM 40 MG PO TBEC: 40.0000 mg | DELAYED_RELEASE_TABLET | Freq: Every day | ORAL | 6 refills | Status: DC

## 2016-05-01 NOTE — Progress Notes (Signed)
Cardiology Office Note  Date: 05/01/2016   ID: [name], DOB 11-23-1940, MRN [id number]  PCP: Octavio Graves, DO  Primary Cardiologist: Rozann Lesches, MD   Chief Complaint  Patient presents with  . Coronary Artery Disease    History of Present Illness: [name] is a 76 y.o. female last seen in December 2017. She presents for a routine follow-up visit. Still complains of a vague feeling of discomfort in her chest, possibly somewhat better on the Norvasc but not resolved. She also has intermittent wheezing and chest congestion in the morning. Has a Ventolin inhaler that helps sometimes. She has not undergone PFTs.  I reviewed her cardiac regimen which is outlined below. She is a over a year out now from her last DES intervention. Plan to stop Brilinta at this time. Otherwise plan to continue aspirin, Norvasc, Lopressor, omega-3 supplements, Maxide, and as needed nitroglycerin.  Follow-up cardiac catheterization from November of last year showed patent stent sites.  Past Medical History:  Diagnosis Date  . Arthritis   . Bulging lumbar disc   . Chronic lower back pain   . Coronary atherosclerosis of native coronary artery    DES LAD 02/2008, DES LAD 02/2015  . Depression   . Dyslipidemia   . Essential hypertension, benign   . Frequent headaches   . Glaucoma   . Refusal of blood transfusions as patient is Jehovah's Witness   . Type 2 diabetes mellitus (HCC)    Borderline    Past Surgical History:  Procedure Laterality Date  . CARDIAC CATHETERIZATION N/A 02/23/2015   Procedure: Left Heart Cath and Coronary Angiography;  Surgeon: Sherren Mocha, MD;  Location: [facility];  Service: Cardiovascular;  Laterality: N/A;  . CARDIAC CATHETERIZATION N/A 02/23/2015   Procedure: Coronary Stent Intervention;  Surgeon: Sherren Mocha, MD;  Location: [facility];  Service: Cardiovascular;  Laterality: N/A;  . CARDIAC CATHETERIZATION N/A 01/04/2016   Procedure: Left  Heart Cath and Coronary Angiography;  Surgeon: Peter M Martinique, MD;  Location: [facility];  Service: Cardiovascular;  Laterality: N/A;  . CORONARY ANGIOPLASTY  02/23/2015  . CORONARY ANGIOPLASTY WITH STENT PLACEMENT  2009  . DILATION AND CURETTAGE OF UTERUS    . TUBAL LIGATION      Current Outpatient Prescriptions  Medication Sig Dispense Refill  . amLODipine (NORVASC) 2.5 MG tablet Take 2.5 mg by mouth daily.    . Ascorbic Acid (VITAMIN C) 500 MG tablet Take 500 mg by mouth daily.      Marland Kitchen aspirin EC 81 MG tablet Take 81 mg by mouth every evening.    . bimatoprost (LUMIGAN) 0.01 % SOLN Place 1 drop into both eyes at bedtime.    . Calcium Carbonate Antacid (TUMS PO) Take 2 tablets by mouth 4 (four) times daily as needed (for acid reflux/indigestion).     . Cholecalciferol (VITAMIN D) 400 UNITS capsule Take 400 Units by mouth daily.      . Cyanocobalamin (VITAMIN B 12 PO) Take 1 tablet by mouth daily.    . Garlic 419 MG TABS Take 1 tablet by mouth daily.      Marland Kitchen HYDROcodone-acetaminophen (NORCO) 10-325 MG tablet Take 1 tablet by mouth 4 (four) times daily as needed. For pain.    Marland Kitchen ibuprofen (ADVIL,MOTRIN) 200 MG tablet Take 600 mg by mouth every 6 (six) hours as needed (for pain.).     Marland Kitchen MAGNESIUM CARBONATE PO Take 1 tablet by mouth daily.    Marland Kitchen  Menthol, Topical Analgesic, (BIOFREEZE) 4 % GEL Apply 1 application topically 4 (four) times daily as needed (for pain).     . metoprolol (LOPRESSOR) 50 MG tablet Take 1 tablet (50 mg total) by mouth 2 (two) times daily. 180 tablet 3  . nitroGLYCERIN (NITROSTAT) 0.4 MG SL tablet Place 0.4 mg under the tongue every 5 (five) minutes as needed for chest pain.    . Omega-3 Fatty Acids (FISH OIL) 1000 MG CAPS Take 1 capsule by mouth 2 (two) times daily.      . pantoprazole (PROTONIX) 40 MG tablet Take 1 tablet (40 mg total) by mouth daily. 30 tablet 6  . ticagrelor (BRILINTA) 90 MG TABS tablet Take 1 tablet (90 mg total) by mouth 2 (two) times daily. 60  tablet 6  . triamterene-hydrochlorothiazide (MAXZIDE-25) 37.5-25 MG per tablet Take 0.5 tablets by mouth daily.    . VENTOLIN HFA 108 (90 Base) MCG/ACT inhaler Inhale 1-2 puffs into the lungs every 4 (four) hours as needed for shortness of breath.    . zolpidem (AMBIEN) 10 MG tablet Take 10 mg by mouth at bedtime.      No current facility-administered medications for this visit.    Allergies:  Patient has no known allergies.   Social History: The patient  reports that she has never smoked. She has never used smokeless tobacco. She reports that she does not drink alcohol or use drugs.   ROS:  Please see the history of present illness. Otherwise, complete review of systems is positive for shortness of breath.  All other systems are reviewed and negative.   Physical Exam: VS:  BP 134/72   Pulse 82   Ht 5\' 5"  (1.651 m)   Wt 185 lb 3.2 oz (84 kg)   BMI 30.82 kg/m , BMI Body mass index is 30.82 kg/m.  Wt Readings from Last 3 Encounters:  05/01/16 185 lb 3.2 oz (84 kg)  01/24/16 182 lb 6.4 oz (82.7 kg)  01/04/16 184 lb (83.5 kg)    Gen.: Patient appears comfortable at rest.  HEENT: Conjunctiva and lids normal, oropharynx clear.  Neck: Supple, no elevated JVP or carotid bruits, no thyromegaly.  Lungs: No active wheezing, nonlabored breathing at rest.  Cardiac: Regular rate and rhythm, no S3 or significant systolic murmur, no pericardial rub.  Abdomen: Soft, nontender, bowel sounds present.  Extremities: No pitting edema, distal pulses 2+. Skin: Warm and dry. Musculoskeletal: No kyphosis. Neuropsychiatric: Alert and oriented 3, affect appropriate.  ECG: I personally reviewed the tracing from 12/30/2015 which showed sinus rhythm with PACs and borderline low voltage.  Recent Labwork: 01/04/2016: BUN 7; Creatinine, Ser 0.78; Hemoglobin 11.6; Platelets 180; Potassium 3.9; Sodium 137  Other Studies Reviewed Today:  Cardiac catheterization 01/04/2016:  The left ventricular  systolic function is normal.  LV end diastolic pressure is moderately elevated.  The left ventricular ejection fraction is 55-65% by visual estimate.  Prox LAD-2 lesion, 0 %stenosed.  A drug eluting .  Prox LAD-1 lesion, 0 %stenosed.  A drug eluting .  Ramus lesion, 30 %stenosed.  Prox Cx lesion, 30 %stenosed.  Mid RCA lesion, 25 %stenosed.  Mid LAD lesion, 40 %stenosed.  1. Nonobstructive CAD. The stents in the LAD are patent 2. Normal LV function 3. Elevated LVEDP  Echocardiogram 02/23/2015: Study Conclusions  - Left ventricle: The cavity size was normal. Systolic function was vigorous. The estimated ejection fraction was in the range of 65% to 70%. Wall motion was normal; there were no regional wall  motion abnormalities. Doppler parameters are consistent with abnormal left ventricular relaxation (grade 1 diastolic dysfunction). Doppler parameters are consistent with elevated ventricular end-diastolic filling pressure. - Aortic valve: Trileaflet; mildly thickened, mildly calcified leaflets. Transvalvular velocity was within the normal range. There was no stenosis. There was moderate regurgitation. - Aortic root: The aortic root was normal in size. - Ascending aorta: The ascending aorta was normal in size. - Mitral valve: Calcified annulus. Mildly thickened leaflets . - Left atrium: The atrium was normal in size. - Right ventricle: The cavity size was normal. Wall thickness was normal. Systolic function was normal. - Right atrium: The atrium was normal in size. - Tricuspid valve: There was mild regurgitation. - Pulmonary arteries: Systolic pressure was mildly increased. PA peak pressure: 38 mm Hg (S). - Inferior vena cava: The vessel was normal in size. - Pericardium, extracardiac: There was no pericardial effusion.  Assessment and Plan:  1. CAD status post DES to the LAD in 2010 more recently December 2017. Plan to stop Brilinta at this  time and otherwise continue baseline cardiac regimen as outlined above. Suspect that some of her symptoms are anginal, but not entirely. We are going to obtain a chest x-ray and PFTs to evaluate pulmonary status as possible etiology.  2. Essential hypertension, blood pressure control is adequate today.  3. 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  4. Hyperlipidemia, on Zocor and omega-3 supplements.  Current medicines were reviewed with the patient today.   Orders Placed This Encounter  Procedures  . DG Chest 2 View  . Pulmonary function test     Disposition: Call with test results.  Signed, Satira Sark, MD, [facility] 05/01/2016 10:41 AM    [facility] [place], [place], [place] [address] Phone: [phone]; Fax: [phone]

## 2016-05-01 NOTE — Patient Instructions (Signed)
Your physician recommends that you schedule a follow-up appointment in: [place] has recommended you make the following change in your medication:   [place]   A chest x-ray takes a picture of the organs and structures inside the chest, including the heart, lungs, and blood vessels. This test can show several things, including, whether the heart is enlarges; whether fluid is building up in the lungs; and whether pacemaker / defibrillator leads are still in place.  Your physician has recommended that you have a pulmonary function test. Pulmonary Function Tests are a group of tests that measure how well air moves in and out of your lungs.  Thank you for choosing [facility]!!

## 2016-05-02 ENCOUNTER — Telehealth: Payer: Self-pay | Admitting: Cardiology

## 2016-05-02 NOTE — Telephone Encounter (Signed)
PFT scheduled March 23 at [facility]

## 2016-05-12 ENCOUNTER — Ambulatory Visit (HOSPITAL_COMMUNITY)
Admission: RE | Admit: 2016-05-12 | Discharge: 2016-05-12 | Disposition: A | Discharge status: Home / Self Care | Payer: Medicare HMO | Source: Ambulatory Visit | Attending: Cardiology | Admitting: Cardiology

## 2016-05-12 DIAGNOSIS — R0602 Shortness of breath: Secondary | ICD-10-CM

## 2016-05-12 DIAGNOSIS — R918 Other nonspecific abnormal finding of lung field: Secondary | ICD-10-CM | POA: Insufficient documentation

## 2016-05-12 MED ORDER — ALBUTEROL SULFATE (2.5 MG/3ML) 0.083% IN NEBU
2.5000 mg | INHALATION_SOLUTION | Freq: Once | RESPIRATORY_TRACT | Status: AC
  Administered 2016-05-12: 2.5 mg via RESPIRATORY_TRACT

## 2016-05-16 ENCOUNTER — Telehealth: Payer: Self-pay

## 2016-05-16 LAB — PULMONARY FUNCTION TEST
DL/VA % pred: 95 %
DL/VA: 4.68 ml/min/mmHg/L
DLCO COR: 16.16 ml/min/mmHg
DLCO UNC % PRED: 63 %
DLCO UNC: 16.16 ml/min/mmHg
DLCO cor % pred: 63 %
FEF 25-75 PRE: 1.58 L/s
FEF 25-75 Post: 1.92 L/sec
FEF2575-%Change-Post: 21 %
FEF2575-%PRED-PRE: 102 %
FEF2575-%Pred-Post: 124 %
FEV1-%Change-Post: 5 %
FEV1-%PRED-PRE: 105 %
FEV1-%Pred-Post: 111 %
FEV1-POST: 1.97 L
FEV1-Pre: 1.87 L
FEV1FVC-%Change-Post: -1 %
FEV1FVC-%Pred-Pre: 102 %
FEV6-%CHANGE-POST: 7 %
FEV6-%PRED-POST: 117 %
FEV6-%PRED-PRE: 109 %
FEV6-PRE: 2.4 L
FEV6-Post: 2.57 L
FEV6FVC-%PRED-PRE: 104 %
FEV6FVC-%Pred-Post: 104 %
FVC-%Change-Post: 7 %
FVC-%Pred-Post: 112 %
FVC-%Pred-Pre: 104 %
FVC-Post: 2.57 L
FVC-Pre: 2.4 L
POST FEV6/FVC RATIO: 100 %
Post FEV1/FVC ratio: 77 %
Pre FEV1/FVC ratio: 78 %
Pre FEV6/FVC Ratio: 100 %
RV % pred: 105 %
RV: 2.5 L
TLC % PRED: 96 %
TLC: 5.03 L

## 2016-05-16 NOTE — Telephone Encounter (Signed)
-----   Message from Satira Sark, MD sent at 05/16/2016  9:24 AM EDT ----- Results reviewed. PFTs do not suggest significant bronchospasm or definite pulmonary cause for shortness of breath. A copy of this test should be forwarded to [place], DO.

## 2016-05-16 NOTE — Telephone Encounter (Signed)
Patient notified

## 2016-05-29 ENCOUNTER — Telehealth: Payer: Self-pay | Admitting: Cardiology

## 2016-05-29 NOTE — Telephone Encounter (Signed)
Pt aware.

## 2016-05-29 NOTE — Telephone Encounter (Signed)
No infiltrates or pleural effusions. There were some mild perihilar and bibasilar coarse interstitial markings that could reflect a mild degree of interstitial edema or even inflammation. Does not suggest any major change in therapy at this time.

## 2016-05-29 NOTE — Telephone Encounter (Signed)
Patient would like to know results of chest Xrays

## 2016-06-30 ENCOUNTER — Other Ambulatory Visit: Payer: Self-pay | Admitting: Cardiology

## 2016-07-14 ENCOUNTER — Other Ambulatory Visit: Payer: Self-pay | Admitting: Cardiology

## 2016-07-30 NOTE — Progress Notes (Signed)
Cardiology Office Note  Date: 07/31/2016   ID: [name], DOB 11-Jun-1940, MRN [id number]  PCP: Octavio Graves, DO  Primary Cardiologist: Rozann Lesches, MD   Chief Complaint  Patient presents with  . Coronary Artery Disease    History of Present Illness: [name] is a 76 y.o. female last seen in March. She presents for a routine follow-up visit. Reports no angina, less shortness of breath with typical activities. She has had 2 recent deaths in her family, a sister and a niece.  She had follow-up PFTs and CXR in March as noted below. I went over the results, no major abnormalities to suspect pulmonary contribution to shortness of breath.  Brilinta was stopped at the last visit. She otherwise continues on aspirin, Norvasc, Lopressor, Maxzide, and as needed nitroglycerin. She reports no orthopnea or leg edema. Weight is down 4 pounds from the last visit.  Past Medical History:  Diagnosis Date  . Arthritis   . Bulging lumbar disc   . Chronic lower back pain   . Coronary atherosclerosis of native coronary artery    DES LAD 02/2008, DES LAD 02/2015  . Depression   . Dyslipidemia   . Essential hypertension, benign   . Frequent headaches   . Glaucoma   . Refusal of blood transfusions as patient is Jehovah's Witness   . Type 2 diabetes mellitus (HCC)    Borderline    Past Surgical History:  Procedure Laterality Date  . CARDIAC CATHETERIZATION N/A 02/23/2015   Procedure: Left Heart Cath and Coronary Angiography;  Surgeon: Sherren Mocha, MD;  Location: [facility];  Service: Cardiovascular;  Laterality: N/A;  . CARDIAC CATHETERIZATION N/A 02/23/2015   Procedure: Coronary Stent Intervention;  Surgeon: Sherren Mocha, MD;  Location: [facility];  Service: Cardiovascular;  Laterality: N/A;  . CARDIAC CATHETERIZATION N/A 01/04/2016   Procedure: Left Heart Cath and Coronary Angiography;  Surgeon: Peter M Martinique, MD;  Location: [facility];  Service:  Cardiovascular;  Laterality: N/A;  . CORONARY ANGIOPLASTY  02/23/2015  . CORONARY ANGIOPLASTY WITH STENT PLACEMENT  2009  . DILATION AND CURETTAGE OF UTERUS    . TUBAL LIGATION      Current Outpatient Prescriptions  Medication Sig Dispense Refill  . amLODipine (NORVASC) 2.5 MG tablet Take 2.5 mg by mouth daily.    . Ascorbic Acid (VITAMIN C) 500 MG tablet Take 500 mg by mouth daily.      Marland Kitchen aspirin EC 81 MG tablet Take 81 mg by mouth every evening.    . bimatoprost (LUMIGAN) 0.01 % SOLN Place 1 drop into both eyes at bedtime.    . Calcium Carbonate Antacid (TUMS PO) Take 2 tablets by mouth 4 (four) times daily as needed (for acid reflux/indigestion).     . Cholecalciferol (VITAMIN D) 400 UNITS capsule Take 400 Units by mouth daily.      . Cyanocobalamin (VITAMIN B 12 PO) Take 1 tablet by mouth daily.    . Garlic 585 MG TABS Take 1 tablet by mouth daily.      Marland Kitchen HYDROcodone-acetaminophen (NORCO) 10-325 MG tablet Take 1 tablet by mouth 4 (four) times daily as needed. For pain.    Marland Kitchen ibuprofen (ADVIL,MOTRIN) 200 MG tablet Take 600 mg by mouth every 6 (six) hours as needed (for pain.).     Marland Kitchen MAGNESIUM CARBONATE PO Take 1 tablet by mouth daily.    . Menthol, Topical Analgesic, (BIOFREEZE) 4 % GEL Apply 1 application topically  4 (four) times daily as needed (for pain).     . metoprolol (LOPRESSOR) 50 MG tablet TAKE ONE TABLET BY MOUTH TWICE DAILY 180 tablet 3  . nitroGLYCERIN (NITROSTAT) 0.4 MG SL tablet Place 0.4 mg under the tongue every 5 (five) minutes as needed for chest pain.    . Omega-3 Fatty Acids (FISH OIL) 1000 MG CAPS Take 1 capsule by mouth 2 (two) times daily.      . pantoprazole (PROTONIX) 40 MG tablet TAKE ONE (1) TABLET EACH DAY 90 tablet 1  . triamterene-hydrochlorothiazide (MAXZIDE-25) 37.5-25 MG per tablet Take 0.5 tablets by mouth daily.    . VENTOLIN HFA 108 (90 Base) MCG/ACT inhaler Inhale 1-2 puffs into the lungs every 4 (four) hours as needed for shortness of breath.    .  zolpidem (AMBIEN) 10 MG tablet Take 10 mg by mouth at bedtime.      No current facility-administered medications for this visit.    Allergies:  Patient has no known allergies.   Social History: The patient  reports that she has never smoked. She has never used smokeless tobacco. She reports that she does not drink alcohol or use drugs.   ROS:  Please see the history of present illness. Otherwise, complete review of systems is positive for none.  All other systems are reviewed and negative.   Physical Exam: VS:  BP (!) 160/82   Pulse 60   Ht 5\' 4"  (1.626 m)   Wt 181 lb 3.2 oz (82.2 kg)   LMP  (LMP Unknown)   BMI 31.10 kg/m , BMI Body mass index is 31.1 kg/m.  Wt Readings from Last 3 Encounters:  07/31/16 181 lb 3.2 oz (82.2 kg)  05/01/16 185 lb 3.2 oz (84 kg)  01/24/16 182 lb 6.4 oz (82.7 kg)    Gen.: Patient appears comfortable at rest.  HEENT: Conjunctiva and lids normal, oropharynx clear.  Neck: Supple, no elevated JVP or carotid bruits, no thyromegaly.  Lungs: No active wheezing, nonlabored breathing at rest.  Cardiac: Regular rate and rhythm, no S3 or significant systolic murmur, no pericardial rub.  Abdomen: Soft, nontender, bowel sounds present.  Extremities: No pitting edema, distal pulses 2+. Skin: Warm and dry. Musculoskeletal: No kyphosis. Neuropsychiatric: Alert and oriented 3, affect appropriate.  ECG: I personally reviewed the tracing from 12/30/2015 which showed sinus rhythm with PACs and borderline low voltage.  Recent Labwork: 01/04/2016: BUN 7; Creatinine, Ser 0.78; Hemoglobin 11.6; Platelets 180; Potassium 3.9; Sodium 137   Other Studies Reviewed Today:  Cardiac catheterization 01/04/2016:  The left ventricular systolic function is normal.  LV end diastolic pressure is moderately elevated.  The left ventricular ejection fraction is 55-65% by visual estimate.  Prox LAD-2 lesion, 0 %stenosed.  A drug eluting .  Prox LAD-1 lesion, 0  %stenosed.  A drug eluting .  Ramus lesion, 30 %stenosed.  Prox Cx lesion, 30 %stenosed.  Mid RCA lesion, 25 %stenosed.  Mid LAD lesion, 40 %stenosed.  1. Nonobstructive CAD. The stents in the LAD are patent 2. Normal LV function 3. Elevated LVEDP  CXR 05/12/2016: FINDINGS: Mild hyperinflation. Progressive perihilar and bibasilar interstitial coarse prominence. No confluent airspace disease. Can't exclude mild central pulmonary vascular congestion.  Heart size and mediastinal contours are within normal limits. Coronary stent.  No effusion.  Visualized bones unremarkable.  IMPRESSION: 1. Some increase in perihilar and bibasilar coarse interstitial disease since 2016.  PFTs 05/12/2016: Interpretation:  1. spirometry is normal with no significant bronchodilator improveent. 2. lung  volumes are normal. 3. airway resistance borderline high. 4. DLCO moderately reduced but normal with volume correction. 5. no cause of shortness of breath identified.  Assessment and Plan:  1. CAD status post DES to the LAD in 2010 and again in January 2017 as outlined above. She reports no active angina and we will continue with medical therapy. She completed one year of DAPT.  2. Dyspnea on exertion, somewhat improved. Suspect component of diastolic dysfunction based on her recent workup. She is on Maxide. We can always consider switching to Aldactone or even adding Lasix if needed. Otherwise continue Norvasc.  3. 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  4. Hyperlipidemia, continues on statin therapy. Keep follow-up with Dr. Melina Copa.  Current medicines were reviewed with the patient today.  Disposition: Follow-up in 6 months, sooner if needed.  Signed, Satira Sark, MD, [facility] 07/31/2016 9:41 AM    [facility] [place], [place], [place] [address] Phone: [phone]; Fax: [phone]

## 2016-07-31 ENCOUNTER — Encounter: Payer: Self-pay | Admitting: Cardiology

## 2016-07-31 ENCOUNTER — Ambulatory Visit (INDEPENDENT_AMBULATORY_CARE_PROVIDER_SITE_OTHER): Payer: Medicare HMO | Admitting: Cardiology

## 2016-07-31 VITALS — BP 160/82 | HR 60 | Ht 64.0 in | Wt 181.2 lb

## 2016-07-31 DIAGNOSIS — R0602 Shortness of breath: Secondary | ICD-10-CM | POA: Diagnosis not present

## 2016-07-31 DIAGNOSIS — I25119 Atherosclerotic heart disease of native coronary artery with unspecified angina pectoris: Secondary | ICD-10-CM | POA: Diagnosis not present

## 2016-07-31 DIAGNOSIS — I471 Supraventricular tachycardia: Secondary | ICD-10-CM

## 2016-07-31 DIAGNOSIS — E782 Mixed hyperlipidemia: Secondary | ICD-10-CM | POA: Diagnosis not present

## 2016-07-31 NOTE — Patient Instructions (Signed)

## 2016-10-19 ENCOUNTER — Other Ambulatory Visit: Payer: Self-pay | Admitting: Cardiology

## 2016-12-01 ENCOUNTER — Encounter: Payer: Self-pay | Admitting: *Deleted

## 2016-12-01 ENCOUNTER — Encounter: Payer: Self-pay | Admitting: Adult Health

## 2016-12-01 ENCOUNTER — Other Ambulatory Visit (HOSPITAL_COMMUNITY)
Admission: RE | Admit: 2016-12-01 | Discharge: 2016-12-01 | Disposition: A | Discharge status: Home / Self Care | Payer: Medicare HMO | Source: Ambulatory Visit | Attending: Adult Health | Admitting: Adult Health

## 2016-12-01 ENCOUNTER — Ambulatory Visit (INDEPENDENT_AMBULATORY_CARE_PROVIDER_SITE_OTHER): Payer: Medicare HMO | Admitting: Adult Health

## 2016-12-01 VITALS — BP 110/76 | HR 78 | Ht 65.0 in | Wt 188.0 lb

## 2016-12-01 DIAGNOSIS — R0789 Other chest pain: Secondary | ICD-10-CM | POA: Insufficient documentation

## 2016-12-01 DIAGNOSIS — I10 Essential (primary) hypertension: Secondary | ICD-10-CM | POA: Diagnosis not present

## 2016-12-01 DIAGNOSIS — R002 Palpitations: Secondary | ICD-10-CM | POA: Diagnosis not present

## 2016-12-01 LAB — BASIC METABOLIC PANEL
Anion gap: 9 (ref 5–15)
BUN: 17 mg/dL (ref 6–20)
CALCIUM: 9.4 mg/dL (ref 8.9–10.3)
CHLORIDE: 98 mmol/L — AB (ref 101–111)
CO2: 29 mmol/L (ref 22–32)
CREATININE: 0.84 mg/dL (ref 0.44–1.00)
GFR calc Af Amer: >60 mL/min (ref >60)
GFR calc non Af Amer: >60 mL/min (ref >60)
GLUCOSE: 138 mg/dL — AB (ref 65–99)
Potassium: 3.9 mmol/L (ref 3.5–5.1)
Sodium: 136 mmol/L (ref 135–145)

## 2016-12-01 LAB — CBC WITH DIFFERENTIAL/PLATELET
BASOS PCT: 0 %
Basophils Absolute: 0 10*3/uL (ref 0.0–0.1)
EOS ABS: 0.4 10*3/uL (ref 0.0–0.7)
Eosinophils Relative: 5 %
HEMATOCRIT: 38.4 % (ref 36.0–46.0)
HEMOGLOBIN: 13 g/dL (ref 12.0–15.0)
LYMPHS ABS: 2.5 10*3/uL (ref 0.7–4.0)
Lymphocytes Relative: 37 %
MCH: 29.9 pg (ref 26.0–34.0)
MCHC: 33.9 g/dL (ref 30.0–36.0)
MCV: 88.3 fL (ref 78.0–100.0)
MONOS PCT: 6 %
Monocytes Absolute: 0.4 10*3/uL (ref 0.1–1.0)
Neutro Abs: 3.5 10*3/uL (ref 1.7–7.7)
Neutrophils Relative %: 52 %
Platelets: 191 10*3/uL (ref 150–400)
RBC: 4.35 MIL/uL (ref 3.87–5.11)
RDW: 12.3 % (ref 11.5–15.5)
WBC: 6.8 10*3/uL (ref 4.0–10.5)

## 2016-12-01 LAB — TSH: TSH: 0.564 u[IU]/mL (ref 0.350–4.500)

## 2016-12-01 NOTE — Patient Instructions (Signed)
Medication Instructions:  Your physician recommends that you continue on your current medications as directed. Please refer to the Current Medication list given to you today.   Labwork: Your physician recommends that you return for lab work in: Today    Testing/Procedures: Your physician has recommended that you wear an event monitor. Event monitors are medical devices that record the heart's electrical activity. Doctors most often [place] these monitors to diagnose arrhythmias. Arrhythmias are problems with the speed or rhythm of the heartbeat. The monitor is a small, portable device. You can wear one while you do your normal daily activities. This is usually used to diagnose what is causing palpitations/syncope (passing out).    Follow-Up: Your physician recommends that you schedule a follow-up appointment in: 1 Month    Any Other Special Instructions Will Be Listed Below (If Applicable).     If you need a refill on your cardiac medications before your next appointment, please call your pharmacy.  Thank you for choosing [facility]!

## 2016-12-01 NOTE — Progress Notes (Signed)
Cardiology Office Note   Date:  12/01/2016   ID:  MAKELL DROHAN, DOB 1940-05-23, MRN [id number]  PCP:  Octavio Graves, DO  Cardiologist:  Domenic Polite  Chief Complaint  Patient presents with  . Coronary Artery Disease  . Hypertension  . Hyperlipidemia      History of Present Illness: [name] is a 76 y.o. female who presents for ongoing assessment and management of coronary artery disease, history of drug-eluting stent to the LAD in 2010 and in 2017, hypertension, dyslipidemia, other history of type 2 diabetes, chronic lower back pain with arthritis. The patient was last seen in the office by Dr. Domenic Polite on 07/31/2016. Brilinta have been discontinued in the setting of dyspnea. She had artery completed one year of dual antiplatelet therapy. No medication changes were made at that time.  Conclusion  01/03/2017    The left ventricular systolic function is normal.  LV end diastolic pressure is moderately elevated.  The left ventricular ejection fraction is 55-65% by visual estimate.  Prox LAD-2 lesion, 0 %stenosed.  A drug eluting .  Prox LAD-1 lesion, 0 %stenosed.  A drug eluting .  Ramus lesion, 30 %stenosed.  Prox Cx lesion, 30 %stenosed.  Mid RCA lesion, 25 %stenosed.  Mid LAD lesion, 40 %stenosed.   1. Nonobstructive CAD. The stents in the LAD are patent 2. Normal LV function 3. Elevated LVEDP  Plan: continue medical therapy. Consider noncardiac causes of chest pain.   She comes today with complaints of recurrent palpitations, near syncope, and chest pressure. She states that this occurs with and without exertion. She has taken NTG on one occasion which relieved symptoms. She is medically compliant. Denies excessive caffeine use.   Past Medical History:  Diagnosis Date  . Arthritis   . Bulging lumbar disc   . Chronic lower back pain   . Coronary atherosclerosis of native coronary artery    DES LAD 02/2008, DES LAD 02/2015  . Depression   .  Dyslipidemia   . Essential hypertension, benign   . Frequent headaches   . Glaucoma   . Refusal of blood transfusions as patient is Jehovah's Witness   . Type 2 diabetes mellitus (HCC)    Borderline    Past Surgical History:  Procedure Laterality Date  . CARDIAC CATHETERIZATION N/A 02/23/2015   Procedure: Left Heart Cath and Coronary Angiography;  Surgeon: Sherren Mocha, MD;  Location: [facility];  Service: Cardiovascular;  Laterality: N/A;  . CARDIAC CATHETERIZATION N/A 02/23/2015   Procedure: Coronary Stent Intervention;  Surgeon: Sherren Mocha, MD;  Location: [facility];  Service: Cardiovascular;  Laterality: N/A;  . CARDIAC CATHETERIZATION N/A 01/04/2016   Procedure: Left Heart Cath and Coronary Angiography;  Surgeon: Peter M Martinique, MD;  Location: [facility];  Service: Cardiovascular;  Laterality: N/A;  . CORONARY ANGIOPLASTY  02/23/2015  . CORONARY ANGIOPLASTY WITH STENT PLACEMENT  2009  . DILATION AND CURETTAGE OF UTERUS    . TUBAL LIGATION       Current Outpatient Prescriptions  Medication Sig Dispense Refill  . amLODipine (NORVASC) 2.5 MG tablet TAKE ONE (1) TABLET EACH DAY 90 tablet 3  . Ascorbic Acid (VITAMIN C) 500 MG tablet Take 500 mg by mouth daily.      Marland Kitchen aspirin EC 81 MG tablet Take 81 mg by mouth every evening.    . bimatoprost (LUMIGAN) 0.01 % SOLN Place 1 drop into both eyes at bedtime.    . Calcium Carbonate Antacid (TUMS  PO) Take 2 tablets by mouth 4 (four) times daily as needed (for acid reflux/indigestion).     . Cholecalciferol (VITAMIN D) 400 UNITS capsule Take 400 Units by mouth daily.      . Cyanocobalamin (VITAMIN B 12 PO) Take 1 tablet by mouth daily.    . Garlic 956 MG TABS Take 1 tablet by mouth daily.      Marland Kitchen HYDROcodone-acetaminophen (NORCO) 10-325 MG tablet Take 1 tablet by mouth 4 (four) times daily as needed. For pain.    Marland Kitchen ibuprofen (ADVIL,MOTRIN) 200 MG tablet Take 600 mg by mouth every 6 (six) hours as needed (for pain.).      Marland Kitchen MAGNESIUM CARBONATE PO Take 1 tablet by mouth daily.    . Menthol, Topical Analgesic, (BIOFREEZE) 4 % GEL Apply 1 application topically 4 (four) times daily as needed (for pain).     . metoprolol (LOPRESSOR) 50 MG tablet TAKE ONE TABLET BY MOUTH TWICE DAILY 180 tablet 3  . nitroGLYCERIN (NITROSTAT) 0.4 MG SL tablet Place 0.4 mg under the tongue every 5 (five) minutes as needed for chest pain.    . Omega-3 Fatty Acids (FISH OIL) 1000 MG CAPS Take 1 capsule by mouth 2 (two) times daily.      . pantoprazole (PROTONIX) 40 MG tablet TAKE ONE (1) TABLET EACH DAY 90 tablet 1  . triamterene-hydrochlorothiazide (MAXZIDE-25) 37.5-25 MG per tablet Take 0.5 tablets by mouth daily.    . VENTOLIN HFA 108 (90 Base) MCG/ACT inhaler Inhale 1-2 puffs into the lungs every 4 (four) hours as needed for shortness of breath.    . zolpidem (AMBIEN) 10 MG tablet Take 10 mg by mouth at bedtime.      No current facility-administered medications for this visit.     Allergies:   Patient has no known allergies.    Social History:  The patient  reports that she has never smoked. She has never used smokeless tobacco. She reports that she does not drink alcohol or use drugs.   Family History:  The patient's family history includes Cancer in her father; Heart attack in her mother.    ROS: All other systems are reviewed and negative. Unless otherwise mentioned in H&P    PHYSICAL EXAM: VS:  BP 110/76   Pulse 78   Ht 5\' 5"  (1.651 m)   Wt 188 lb (85.3 kg)   LMP  (LMP Unknown)   SpO2 96%   BMI 31.28 kg/m  , BMI Body mass index is 31.28 kg/m. GEN: Well nourished, well developed, in no acute distress  HEENT: normal  Neck: no JVD, carotid bruits, or masses Cardiac: RRR; no murmurs, rubs, or gallops,no edema  Respiratory:  clear to auscultation bilaterally, normal work of breathing GI: soft, nontender, nondistended, + BS MS: no deformity or atrophy  Skin: warm and dry, no rash Neuro:  Strength and sensation are  intact Psych: euthymic mood, full affect   EKG:  NSR rate of 83 bpm.   Recent Labs: 01/04/2016: BUN 7; Creatinine, Ser 0.78; Hemoglobin 11.6; Platelets 180; Potassium 3.9; Sodium 137    Lipid Panel    Component Value Date/Time   CHOL (H) 02/20/2008 1953    212        ATP III CLASSIFICATION:  <200     mg/dL   Desirable  200-239  mg/dL   Borderline High  >=240    mg/dL   High   TRIG 149 02/20/2008 1953   HDL 40 02/20/2008 1953   CHOLHDL  5.3 02/20/2008 1953   VLDL 30 02/20/2008 1953   LDLCALC (H) 02/20/2008 1953    142        Total Cholesterol/HDL:CHD Risk Coronary Heart Disease Risk Table                     Men   Women  1/2 Average Risk   3.4   3.3      Wt Readings from Last 3 Encounters:  12/01/16 188 lb (85.3 kg)  07/31/16 181 lb 3.2 oz (82.2 kg)  05/01/16 185 lb 3.2 oz (84 kg)      Other studies Reviewed: Echocardiogram 19-Mar-2016 Left ventricle: The cavity size was normal. Systolic function was   vigorous. The estimated ejection fraction was in the range of 65%   to 70%. Wall motion was normal; there were no regional wall   motion abnormalities. Doppler parameters are consistent with   abnormal left ventricular relaxation (grade 1 diastolic   dysfunction). Doppler parameters are consistent with elevated   ventricular end-diastolic filling pressure. - Aortic valve: Trileaflet; mildly thickened, mildly calcified   leaflets. Transvalvular velocity was within the normal range.   There was no stenosis. There was moderate regurgitation. - Aortic root: The aortic root was normal in size. - Ascending aorta: The ascending aorta was normal in size. - Mitral valve: Calcified annulus. Mildly thickened leaflets . - Left atrium: The atrium was normal in size. - Right ventricle: The cavity size was normal. Wall thickness was   normal. Systolic function was normal. - Right atrium: The atrium was normal in size. - Tricuspid valve: There was mild regurgitation. - Pulmonary  arteries: Systolic pressure was mildly increased. PA   peak pressure: 38 mm Hg (S). - Inferior vena cava: The vessel was normal in size. - Pericardium, extracardiac: There was no pericardial effusion.  ASSESSMENT AND PLAN:  1. CAD: Non obstructive per cardiac cath one year ago. Stent is patent. Recurrent chest pressure with palpitations is main complaint today. I will check BMET, TSH, and CBC to evaluate for anemia, dehydrations, and thyroid disease.   2. Frequent Palpitations with Dizziness: She did not pass out, but has had a lot of dizziness associated with palpitations. I will place 7 day cardiac monitor on the patient as she reports that these symptoms occur several times a week. Labs as above.   3. Hypertension: She is on amlodipine, metoprolol, and Maxzide/ May need to remove HCTZ portion of this and change to low dose ARB instead. Will follow.    Current medicines are reviewed at length with the patient today.    Labs/ tests ordered today include: BMET, CBC, TSH, Cardiac Monitor.  Phill Myron. [place] Pugh, ANP, AACC   12/01/2016 2:47 PM    [facility] [address] [place], [place] [address] Phone: [phone]; Fax: [phone]

## 2016-12-08 ENCOUNTER — Encounter (INDEPENDENT_AMBULATORY_CARE_PROVIDER_SITE_OTHER): Payer: Medicare HMO

## 2016-12-08 DIAGNOSIS — I10 Essential (primary) hypertension: Secondary | ICD-10-CM | POA: Diagnosis not present

## 2016-12-08 DIAGNOSIS — R002 Palpitations: Secondary | ICD-10-CM

## 2016-12-08 DIAGNOSIS — R0789 Other chest pain: Secondary | ICD-10-CM

## 2016-12-11 ENCOUNTER — Telehealth: Payer: Self-pay | Admitting: Adult Health

## 2016-12-11 NOTE — Telephone Encounter (Signed)
Patient has questions regarding monitor placement/tg

## 2016-12-11 NOTE — Telephone Encounter (Signed)
Called pt and instructed on how to charge and place monitor. Pt will call [employer] or office if she has more questions.

## 2016-12-29 NOTE — Progress Notes (Signed)
Cardiology Office Note   Date:  01/01/2017   ID:  [name], DOB 13-Oct-1940, MRN [id number]  PCP:  Octavio Graves, DO  Cardiologist: Satira Sark, MD  Chief Complaint  Patient presents with  . Coronary Artery Disease  . Hypertension  . Palpitations      History of Present Illness: [name] is a 76 y.o. female who presents for ongoing assessment and management of coronary artery disease, history of DES to the LAD in 2010 and in 2017, dyslipidemia, hypertension, type 2 diabetes, with other history to include arthritis and chronic lower back pain.  Patient was last seen in the office on 12/01/2016, with complaints of recurrent palpitations, near syncope, and chest pressure.  On that last office visit, no further cardiac testing was completed and she had had a react catheterization revealing nonobstructive coronary artery disease at that time.  Labs were checked to include a BMET, TSH, and CBC.  A 7-day cardiac monitor was also placed to evaluate for cardiac arrhythmias dizziness, with complaints of palpitations.  Labs: Sodium 136 potassium 3.9 chloride 98 CO2 29 glucose 138 BUN 17 creatinine 0.84.            Hemoglobin 13.0 hematocrit 38.4 white blood cells 6.8 platelets 191.            TSH 0.564.  Study Highlights   7-day event monitor reviewed.  Sinus rhythm was present throughout.  Heart rate ranged from 57 bpm up to 118 bpm.  Rare PACs were noted including a burst of PAT lasting 8 seconds at 150 bpm.   She comes today with continued complaints of palpitations.  She also states that she is easily having dyspnea on exertion with elevated heart rate.   Past Medical History:  Diagnosis Date  . Arthritis   . Bulging lumbar disc   . Chronic lower back pain   . Coronary atherosclerosis of native coronary artery    DES LAD 02/2008, DES LAD 02/2015  . Depression   . Dyslipidemia   . Essential hypertension, benign   . Frequent headaches   . Glaucoma   . Refusal of  blood transfusions as patient is Jehovah's Witness   . Type 2 diabetes mellitus (HCC)    Borderline    Past Surgical History:  Procedure Laterality Date  . CORONARY ANGIOPLASTY  02/23/2015  . CORONARY ANGIOPLASTY WITH STENT PLACEMENT  2009  . DILATION AND CURETTAGE OF UTERUS    . TUBAL LIGATION       Current Outpatient Medications  Medication Sig Dispense Refill  . amLODipine (NORVASC) 2.5 MG tablet TAKE ONE (1) TABLET EACH DAY 90 tablet 3  . Ascorbic Acid (VITAMIN C) 500 MG tablet Take 500 mg by mouth daily.      Marland Kitchen aspirin EC 81 MG tablet Take 81 mg by mouth every evening.    . bimatoprost (LUMIGAN) 0.01 % SOLN Place 1 drop into both eyes at bedtime.    . Calcium Carbonate Antacid (TUMS PO) Take 2 tablets by mouth 4 (four) times daily as needed (for acid reflux/indigestion).     . Cholecalciferol (VITAMIN D) 400 UNITS capsule Take 400 Units by mouth daily.      . Cyanocobalamin (VITAMIN B 12 PO) Take 1 tablet by mouth daily.    . Garlic 937 MG TABS Take 1 tablet by mouth daily.      Marland Kitchen HYDROcodone-acetaminophen (NORCO) 10-325 MG tablet Take 1 tablet by mouth 4 (four) times daily as needed. For pain.    Marland Kitchen  ibuprofen (ADVIL,MOTRIN) 200 MG tablet Take 600 mg by mouth every 6 (six) hours as needed (for pain.).     Marland Kitchen MAGNESIUM CARBONATE PO Take 1 tablet by mouth daily.    . Menthol, Topical Analgesic, (BIOFREEZE) 4 % GEL Apply 1 application topically 4 (four) times daily as needed (for pain).     . metoprolol (LOPRESSOR) 50 MG tablet TAKE ONE TABLET BY MOUTH TWICE DAILY 180 tablet 3  . nitroGLYCERIN (NITROSTAT) 0.4 MG SL tablet Place 0.4 mg under the tongue every 5 (five) minutes as needed for chest pain.    . Omega-3 Fatty Acids (FISH OIL) 1000 MG CAPS Take 1 capsule by mouth 2 (two) times daily.      . pantoprazole (PROTONIX) 40 MG tablet TAKE ONE (1) TABLET EACH DAY 90 tablet 1  . triamterene-hydrochlorothiazide (MAXZIDE-25) 37.5-25 MG per tablet Take 0.5 tablets by mouth daily.    .  VENTOLIN HFA 108 (90 Base) MCG/ACT inhaler Inhale 1-2 puffs into the lungs every 4 (four) hours as needed for shortness of breath.    . zolpidem (AMBIEN) 10 MG tablet Take 10 mg by mouth at bedtime.      No current facility-administered medications for this visit.     Allergies:   Patient has no known allergies.    Social History:  The patient  reports that  has never smoked. she has never used smokeless tobacco. She reports that she does not drink alcohol or use drugs.   Family History:  The patient's family history includes Cancer in her father; Heart attack in her mother.    ROS: All other systems are reviewed and negative. Unless otherwise mentioned in H&P    PHYSICAL EXAM: VS:  Wt 189 lb (85.7 kg)   LMP  (LMP Unknown)   BMI 31.45 kg/m  , BMI Body mass index is 31.45 kg/m. GEN: Well nourished, well developed, in no acute distress  HEENT: normal  Neck: no JVD, carotid bruits, or masses Cardiac: RRR; no murmurs, rubs, or gallops,no edema  Respiratory: Clear to auscultation bilaterally, normal work of breathing GI: soft, nontender, nondistended, + BS MS: no deformity or atrophy  Skin: warm and dry, no rash.  Skin irritation where cardiac monitoring electrodes were placed on her chest. Neuro:  Strength and sensation are intact Psych: euthymic mood, full affect   Recent Labs: 12/01/2016: BUN 17; Creatinine, Ser 0.84; Hemoglobin 13.0; Platelets 191; Potassium 3.9; Sodium 136; TSH 0.564    Lipid Panel    Component Value Date/Time   CHOL (H) 02/20/2008 1953    212        ATP III CLASSIFICATION:  <200     mg/dL   Desirable  200-239  mg/dL   Borderline High  >=240    mg/dL   High   TRIG 149 02/20/2008 1953   HDL 40 02/20/2008 1953   CHOLHDL 5.3 02/20/2008 1953   VLDL 30 02/20/2008 1953   LDLCALC (H) 02/20/2008 1953    142        Total Cholesterol/HDL:CHD Risk Coronary Heart Disease Risk Table                     Men   Women  1/2 Average Risk   3.4   3.3      Wt  Readings from Last 3 Encounters:  01/01/17 189 lb (85.7 kg)  12/01/16 188 lb (85.3 kg)  07/31/16 181 lb 3.2 oz (82.2 kg)      Other studies  Reviewed: Echocardiogram 02/23/2015 Left ventricle: The cavity size was normal. Systolic function was   vigorous. The estimated ejection fraction was in the range of 65%   to 70%. Wall motion was normal; there were no regional wall   motion abnormalities. Doppler parameters are consistent with   abnormal left ventricular relaxation (grade 1 diastolic   dysfunction). Doppler parameters are consistent with elevated   ventricular end-diastolic filling pressure. - Aortic valve: Trileaflet; mildly thickened, mildly calcified   leaflets. Transvalvular velocity was within the normal range.   There was no stenosis. There was moderate regurgitation. - Aortic root: The aortic root was normal in size. - Ascending aorta: The ascending aorta was normal in size. - Mitral valve: Calcified annulus. Mildly thickened leaflets . - Left atrium: The atrium was normal in size. - Right ventricle: The cavity size was normal. Wall thickness was   normal. Systolic function was normal. - Right atrium: The atrium was normal in size. - Tricuspid valve: There was mild regurgitation. - Pulmonary arteries: Systolic pressure was mildly increased. PA   peak pressure: 38 mm Hg (S). - Inferior vena cava: The vessel was normal in size. - Pericardium, extracardiac: There was no pericardial effusion.  Cardiac Catheterization 01/04/2016 Conclusion     The left ventricular systolic function is normal.  LV end diastolic pressure is moderately elevated.  The left ventricular ejection fraction is 55-65% by visual estimate.  Prox LAD-2 lesion, 0 %stenosed.  A drug eluting .  Prox LAD-1 lesion, 0 %stenosed.  A drug eluting .  Ramus lesion, 30 %stenosed.  Prox Cx lesion, 30 %stenosed.  Mid RCA lesion, 25 %stenosed.  Mid LAD lesion, 40 %stenosed.   1. Nonobstructive  CAD. The stents in the LAD are patent 2. Normal LV function 3. Elevated LVEDP    ASSESSMENT AND PLAN:  1.  Paroxysmal PAT: Noted on 7-day cardiac monitor.  She continues to feel her heart racing at times.  I will increase her metoprolol from 50 mg twice daily to 75 mg twice daily.  I will discontinue amlodipine to prevent hypotension.  The patient is concerned about these medication changes and has asked me to discuss this with Dr. Domenic Polite before she changes her doses.  Have discussed this with Dr. Domenic Polite who is in the clinic today.  He has reviewed it and is in agreement with medication changes.  The patient will follow up with Dr. Domenic Polite on next office visit.  2.  Hypertension: Blood pressure is soft, but normal under new ACC guidelines.  With addition of increased dose of metoprolol, discontinuing amlodipine as discussed above.  Further medication adjustments when seen on follow-up.  3.  CAD: Recent react catheterization as above in 2017.  Revealing nonobstructive CAD with patent stents to the LAD.  Continue medical management with secondary prevention.  4. Skin irritation from EKG electrodes: Well order hydrocortisone cream topically for skin irritation.  Current medicines are reviewed at length with the patient today.    Labs/ tests ordered today include:  Phill Myron. West Pugh, ANP, AACC   01/01/2017 1:23 PM    [facility] [address] [place], [place] [address] Phone: [phone]; Fax: [phone]

## 2017-01-01 ENCOUNTER — Ambulatory Visit: Payer: Medicare HMO | Admitting: Adult Health

## 2017-01-01 ENCOUNTER — Encounter: Payer: Self-pay | Admitting: Adult Health

## 2017-01-01 VITALS — BP 118/56 | HR 74 | Wt 189.0 lb

## 2017-01-01 DIAGNOSIS — I10 Essential (primary) hypertension: Secondary | ICD-10-CM | POA: Diagnosis not present

## 2017-01-01 DIAGNOSIS — R002 Palpitations: Secondary | ICD-10-CM

## 2017-01-01 DIAGNOSIS — I2510 Atherosclerotic heart disease of native coronary artery without angina pectoris: Secondary | ICD-10-CM

## 2017-01-01 MED ORDER — METOPROLOL TARTRATE 50 MG PO TABS: 75.0000 mg | ORAL_TABLET | Freq: Two times a day (BID) | ORAL | 3 refills | Status: DC

## 2017-01-01 MED ORDER — HYDROCORTISONE 2.5 % EX CREA: TOPICAL_CREAM | Freq: Two times a day (BID) | CUTANEOUS | 0 refills | Status: DC

## 2017-01-01 NOTE — Patient Instructions (Signed)
Medication Instructions:  STOP AMLODIPINE  INCREASE METOPROLOL TO 75 MG - TWO TIMES DAILY   Labwork: NONE  Testing/Procedures: NONE  Follow-Up: Your physician recommends that you schedule a follow-up appointment in: [place]    Any Other Special Instructions Will Be Listed Below (If Applicable).     If you need a refill on your cardiac medications before your next appointment, please call your pharmacy.

## 2017-01-01 NOTE — Addendum Note (Signed)
Addended by: Levonne Hubert on: 01/01/2017 05:17 PM   Modules accepted: Orders

## 2017-02-19 ENCOUNTER — Ambulatory Visit (INDEPENDENT_AMBULATORY_CARE_PROVIDER_SITE_OTHER): Payer: Medicare HMO | Admitting: Cardiology

## 2017-02-19 ENCOUNTER — Encounter: Payer: Self-pay | Admitting: Cardiology

## 2017-02-19 VITALS — BP 135/78 | HR 66 | Ht 64.0 in | Wt 184.0 lb

## 2017-02-19 DIAGNOSIS — I471 Supraventricular tachycardia: Secondary | ICD-10-CM

## 2017-02-19 DIAGNOSIS — R002 Palpitations: Secondary | ICD-10-CM | POA: Diagnosis not present

## 2017-02-19 DIAGNOSIS — E782 Mixed hyperlipidemia: Secondary | ICD-10-CM

## 2017-02-19 DIAGNOSIS — I2510 Atherosclerotic heart disease of native coronary artery without angina pectoris: Secondary | ICD-10-CM

## 2017-02-19 NOTE — Progress Notes (Signed)
Cardiology Office Note  Date: 02/19/2017   ID: [name], DOB [birth year], MRN [id number]  PCP: Octavio Graves, DO  Primary Cardiologist: Rozann Lesches, MD   Chief Complaint  Patient presents with  . Coronary Artery Disease    History of Present Illness: [name] is a 76 y.o. female last seen by Ms. West Pugh in November. At that time metoprolol was increased to 75 mg twice daily due to recurrent palpitations. She had worn a cardiac monitor in October that demonstrated sinus rhythm with rare PACs and a burst of PAT lasting 8 seconds 150 bpm. She states that she has fewer palpitations, still a brief sense of lightheadedness but she has had no chest pain or syncope.  We went over her medications which are outlined below. Otherwise stable from a cardiac perspective.  She states that dyspnea on exertion has not worsened. She has had no orthopnea or PND. Her weight is down about 5 pounds.  Past Medical History:  Diagnosis Date  . Arthritis   . Bulging lumbar disc   . Chronic lower back pain   . Coronary atherosclerosis of native coronary artery    DES LAD 02/2008, DES LAD 02/2015  . Depression   . Dyslipidemia   . Essential hypertension, benign   . Frequent headaches   . Glaucoma   . Refusal of blood transfusions as patient is Jehovah's Witness   . Type 2 diabetes mellitus (HCC)    Borderline    Past Surgical History:  Procedure Laterality Date  . CARDIAC CATHETERIZATION N/A 02/23/2015   Procedure: Left Heart Cath and Coronary Angiography;  Surgeon: Sherren Mocha, MD;  Location: [facility];  Service: Cardiovascular;  Laterality: N/A;  . CARDIAC CATHETERIZATION N/A 02/23/2015   Procedure: Coronary Stent Intervention;  Surgeon: Sherren Mocha, MD;  Location: [facility];  Service: Cardiovascular;  Laterality: N/A;  . CARDIAC CATHETERIZATION N/A 01/04/2016   Procedure: Left Heart Cath and Coronary Angiography;  Surgeon: Peter M Martinique, MD;  Location:  [facility];  Service: Cardiovascular;  Laterality: N/A;  . CORONARY ANGIOPLASTY  02/23/2015  . CORONARY ANGIOPLASTY WITH STENT PLACEMENT  2009  . DILATION AND CURETTAGE OF UTERUS    . TUBAL LIGATION      Current Outpatient Medications  Medication Sig Dispense Refill  . Ascorbic Acid (VITAMIN C) 500 MG tablet Take 500 mg by mouth daily.      Marland Kitchen aspirin EC 81 MG tablet Take 81 mg by mouth every evening.    . bimatoprost (LUMIGAN) 0.01 % SOLN Place 1 drop into both eyes at bedtime.    . Calcium Carbonate Antacid (TUMS PO) Take 2 tablets by mouth 4 (four) times daily as needed (for acid reflux/indigestion).     . Cholecalciferol (VITAMIN D) 400 UNITS capsule Take 400 Units by mouth daily.      . Cyanocobalamin (VITAMIN B 12 PO) Take 1 tablet by mouth daily.    . Garlic 536 MG TABS Take 1 tablet by mouth daily.      Marland Kitchen HYDROcodone-acetaminophen (NORCO) 10-325 MG tablet Take 1 tablet by mouth 4 (four) times daily as needed. For pain.    . hydrocortisone 2.5 % cream Apply 2 (two) times daily topically. 30 g 0  . ibuprofen (ADVIL,MOTRIN) 200 MG tablet Take 600 mg by mouth every 6 (six) hours as needed (for pain.).     Marland Kitchen MAGNESIUM CARBONATE PO Take 1 tablet by mouth daily.    Marland Kitchen  Menthol, Topical Analgesic, (BIOFREEZE) 4 % GEL Apply 1 application topically 4 (four) times daily as needed (for pain).     . metoprolol tartrate (LOPRESSOR) 50 MG tablet Take 1.5 tablets (75 mg total) 2 (two) times daily by mouth. 180 tablet 3  . nitroGLYCERIN (NITROSTAT) 0.4 MG SL tablet Place 0.4 mg under the tongue every 5 (five) minutes as needed for chest pain.    . Omega-3 Fatty Acids (FISH OIL) 1000 MG CAPS Take 1 capsule by mouth 2 (two) times daily.      . pantoprazole (PROTONIX) 40 MG tablet TAKE ONE (1) TABLET EACH DAY 90 tablet 1  . triamterene-hydrochlorothiazide (MAXZIDE-25) 37.5-25 MG per tablet Take 0.5 tablets by mouth daily.    . VENTOLIN HFA 108 (90 Base) MCG/ACT inhaler Inhale 1-2 puffs into the  lungs every 4 (four) hours as needed for shortness of breath.    . zolpidem (AMBIEN) 10 MG tablet Take 10 mg by mouth at bedtime.      No current facility-administered medications for this visit.    Allergies:  Patient has no known allergies.   Social History: The patient  reports that  has never smoked. she has never used smokeless tobacco. She reports that she does not drink alcohol or use drugs.   ROS:  Please see the history of present illness. Otherwise, complete review of systems is positive for intermittent headaches.  All other systems are reviewed and negative.   Physical Exam: VS:  BP 135/78   Pulse 66   Ht 5\' 4"  (1.626 m)   Wt 184 lb (83.5 kg)   LMP  (LMP Unknown)   BMI 31.58 kg/m , BMI Body mass index is 31.58 kg/m.  Wt Readings from Last 3 Encounters:  02/19/17 184 lb (83.5 kg)  01/01/17 189 lb (85.7 kg)  12/01/16 188 lb (85.3 kg)    General: Patient appears comfortable at rest. HEENT: Conjunctiva and lids normal, oropharynx clear. Neck: Supple, no elevated JVP or carotid bruits, no thyromegaly. Lungs: Clear to auscultation, nonlabored breathing at rest. Cardiac: Regular rate and rhythm, no S3 or significant systolic murmur, no pericardial rub. Abdomen: Soft, nontender, bowel sounds present. Extremities: No pitting edema, distal pulses 2+. Skin: Warm and dry. Musculoskeletal: No kyphosis. Neuropsychiatric: Alert and oriented x3, affect grossly appropriate.  ECG: I personally reviewed the tracing from 12/02/18/2018 which showed sinus rhythm with PAC.  Recent Labwork: 12/01/2016: BUN 17; Creatinine, Ser 0.84; Hemoglobin 13.0; Platelets 191; Potassium 3.9; Sodium 136; TSH 0.564   Other Studies Reviewed Today:  Cardiac catheterization 01/04/2016:  The left ventricular systolic function is normal.  LV end diastolic pressure is moderately elevated.  The left ventricular ejection fraction is 55-65% by visual estimate.  Prox LAD-2 lesion, 0 %stenosed.  A  drug eluting .  Prox LAD-1 lesion, 0 %stenosed.  A drug eluting .  Ramus lesion, 30 %stenosed.  Prox Cx lesion, 30 %stenosed.  Mid RCA lesion, 25 %stenosed.  Mid LAD lesion, 40 %stenosed.   1. Nonobstructive CAD. The stents in the LAD are patent 2. Normal LV function 3. Elevated LVEDP  Assessment and Plan:  1. Palpitations with history of PSVT. Agree with increase in Lopressor to 75 mg twice daily which she seems to be tolerating. Continue observation.  2. Symptomatically stable CAD with history of DES to the LAD in 2010 as well as 2017. She reports no active angina symptoms this time.  3. Mixed hyperlipidemia, continues to follow with Dr. Melina Copa.  Current medicines were reviewed  with the patient today.  Disposition: Follow-up in 6 months.  Signed, Satira Sark, MD, [facility] 02/19/2017 3:27 PM    [facility], [place], [place] [address] Phone: [phone]; Fax: [phone]

## 2017-02-19 NOTE — Patient Instructions (Signed)

## 2017-03-13 ENCOUNTER — Other Ambulatory Visit: Payer: Self-pay | Admitting: Cardiology

## 2017-08-22 NOTE — Progress Notes (Signed)
Cardiology Office Note  Date: 08/24/2017   ID: [name], DOB September 25, 1940, MRN [id number]  PCP: Octavio Graves, DO  Primary Cardiologist: Rozann Lesches, MD   Chief Complaint  Patient presents with  . Coronary Artery Disease    History of Present Illness: [name] is a 77 y.o. female last seen in December 2018.  She is here for a follow-up visit.  Reports no progressive palpitations or worsening chest pain on current regimen.  She has been under a lot of stress, her son has moved back into the house after losing his job as a Development worker, international aid.  Current cardiac regimen includes aspirin, Lopressor, Maxide, and as needed nitroglycerin.  She continues to follow-up with Dr. Melina Copa.  Past Medical History:  Diagnosis Date  . Arthritis   . Bulging lumbar disc   . Chronic lower back pain   . Coronary atherosclerosis of native coronary artery    DES LAD 02/2008, DES LAD 02/2015  . Depression   . Dyslipidemia   . Essential hypertension, benign   . Frequent headaches   . Glaucoma   . Refusal of blood transfusions as patient is Jehovah's Witness   . Type 2 diabetes mellitus (HCC)    Borderline    Past Surgical History:  Procedure Laterality Date  . CARDIAC CATHETERIZATION N/A 02/23/2015   Procedure: Left Heart Cath and Coronary Angiography;  Surgeon: Sherren Mocha, MD;  Location: [facility];  Service: Cardiovascular;  Laterality: N/A;  . CARDIAC CATHETERIZATION N/A 02/23/2015   Procedure: Coronary Stent Intervention;  Surgeon: Sherren Mocha, MD;  Location: [facility];  Service: Cardiovascular;  Laterality: N/A;  . CARDIAC CATHETERIZATION N/A 01/04/2016   Procedure: Left Heart Cath and Coronary Angiography;  Surgeon: Peter M Martinique, MD;  Location: [facility];  Service: Cardiovascular;  Laterality: N/A;  . CORONARY ANGIOPLASTY  02/23/2015  . CORONARY ANGIOPLASTY WITH STENT PLACEMENT  2009  . DILATION AND CURETTAGE OF UTERUS    . TUBAL LIGATION       Current Outpatient Medications  Medication Sig Dispense Refill  . Ascorbic Acid (VITAMIN C) 500 MG tablet Take 500 mg by mouth daily.      Marland Kitchen aspirin EC 81 MG tablet Take 81 mg by mouth every evening.    . bimatoprost (LUMIGAN) 0.01 % SOLN Place 1 drop into both eyes at bedtime.    . Calcium Carbonate Antacid (TUMS PO) Take 2 tablets by mouth 4 (four) times daily as needed (for acid reflux/indigestion).     . Cholecalciferol (VITAMIN D) 400 UNITS capsule Take 400 Units by mouth daily.      . Cyanocobalamin (VITAMIN B 12 PO) Take 1 tablet by mouth daily.    . Garlic 268 MG TABS Take 1 tablet by mouth daily.      Marland Kitchen HYDROcodone-acetaminophen (NORCO) 10-325 MG tablet Take 1 tablet by mouth 4 (four) times daily as needed. For pain.    . hydrocortisone 2.5 % cream Apply 2 (two) times daily topically. 30 g 0  . ibuprofen (ADVIL,MOTRIN) 200 MG tablet Take 600 mg by mouth every 6 (six) hours as needed (for pain.).     Marland Kitchen MAGNESIUM CARBONATE PO Take 1 tablet by mouth daily.    . Menthol, Topical Analgesic, (BIOFREEZE) 4 % GEL Apply 1 application topically 4 (four) times daily as needed (for pain).     . metoprolol tartrate (LOPRESSOR) 50 MG tablet Take 1.5 tablets (75 mg total) 2 (two) times daily by  mouth. 180 tablet 3  . nitroGLYCERIN (NITROSTAT) 0.4 MG SL tablet Place 0.4 mg under the tongue every 5 (five) minutes as needed for chest pain.    . Omega-3 Fatty Acids (FISH OIL) 1000 MG CAPS Take 1 capsule by mouth 2 (two) times daily.      . pantoprazole (PROTONIX) 40 MG tablet TAKE ONE (1) TABLET EACH DAY 90 tablet 1  . triamterene-hydrochlorothiazide (MAXZIDE-25) 37.5-25 MG per tablet Take 0.5 tablets by mouth daily.    . VENTOLIN HFA 108 (90 Base) MCG/ACT inhaler Inhale 1-2 puffs into the lungs every 4 (four) hours as needed for shortness of breath.    . zolpidem (AMBIEN) 10 MG tablet Take 10 mg by mouth at bedtime.      No current facility-administered medications for this visit.    Allergies:   Patient has no known allergies.   Social History: The patient  reports that she has never smoked. She has never used smokeless tobacco. She reports that she does not drink alcohol or use drugs.   ROS:  Please see the history of present illness. Otherwise, complete review of systems is positive for right-sided tongue numbness, also right ear pain - scheduled to see ENT.  All other systems are reviewed and negative.   Physical Exam: VS:  BP 136/62   Pulse 68   Ht 5\' 4"  (1.626 m)   Wt 183 lb (83 kg)   LMP  (LMP Unknown)   SpO2 95% Comment: on room air  BMI 31.41 kg/m , BMI Body mass index is 31.41 kg/m.  Wt Readings from Last 3 Encounters:  08/24/17 183 lb (83 kg)  02/19/17 184 lb (83.5 kg)  01/01/17 189 lb (85.7 kg)    General: Patient appears comfortable at rest. HEENT: Conjunctiva and lids normal, oropharynx clear. Neck: Supple, no elevated JVP or carotid bruits, no thyromegaly. Lungs: Clear to auscultation, nonlabored breathing at rest. Cardiac: Regular rate and rhythm, no S3 or significant systolic murmur, no pericardial rub. Abdomen: Soft, nontender, bowel sounds present. Extremities: No pitting edema, distal pulses 2+. Skin: Warm and dry. Musculoskeletal: No kyphosis. Neuropsychiatric: Alert and oriented x3, affect grossly appropriate.  ECG: I personally reviewed the tracing from 12/01/2016 which showed sinus rhythm with PAC.  Recent Labwork: 12/01/2016: BUN 17; Creatinine, Ser 0.84; Hemoglobin 13.0; Platelets 191; Potassium 3.9; Sodium 136; TSH 0.564   Other Studies Reviewed Today:  Cardiac catheterization 01/04/2016:  The left ventricular systolic function is normal.  LV end diastolic pressure is moderately elevated.  The left ventricular ejection fraction is 55-65% by visual estimate.  Prox LAD-2 lesion, 0 %stenosed.  A drug eluting .  Prox LAD-1 lesion, 0 %stenosed.  A drug eluting .  Ramus lesion, 30 %stenosed.  Prox Cx lesion, 30 %stenosed.  Mid  RCA lesion, 25 %stenosed.  Mid LAD lesion, 40 %stenosed.  1. Nonobstructive CAD. The stents in the LAD are patent 2. Normal LV function 3. Elevated LVEDP  Assessment and Plan:  1.  Palpitations and PSVT.  Patient reports no progressive symptoms on current dose of Lopressor.  No changes were made today.  2.  CAD with history of DES to the LAD in 2010 and 2017.  No progressive angina symptoms. She remains on aspirin and beta-blocker.  She has had statin intolerance.  Current medicines were reviewed with the patient today.  Disposition: Follow-up in 6 months.  Signed, Satira Sark, MD, [facility] 08/24/2017 11:26 AM    [facility]  [place], [place], [place] [address] Phone: [phone]; Fax: [phone]

## 2017-08-24 ENCOUNTER — Encounter: Payer: Self-pay | Admitting: Cardiology

## 2017-08-24 ENCOUNTER — Ambulatory Visit: Payer: Medicare HMO | Admitting: Cardiology

## 2017-08-24 ENCOUNTER — Telehealth: Payer: Self-pay | Admitting: *Deleted

## 2017-08-24 VITALS — BP 136/62 | HR 68 | Ht 64.0 in | Wt 183.0 lb

## 2017-08-24 DIAGNOSIS — I25119 Atherosclerotic heart disease of native coronary artery with unspecified angina pectoris: Secondary | ICD-10-CM | POA: Diagnosis not present

## 2017-08-24 DIAGNOSIS — I471 Supraventricular tachycardia: Secondary | ICD-10-CM

## 2017-08-24 NOTE — Telephone Encounter (Signed)
Called office to give name of eye drops she is taking

## 2017-08-24 NOTE — Patient Instructions (Addendum)

## 2017-08-29 ENCOUNTER — Other Ambulatory Visit: Payer: Self-pay | Admitting: Adult Health

## 2017-09-27 ENCOUNTER — Ambulatory Visit (INDEPENDENT_AMBULATORY_CARE_PROVIDER_SITE_OTHER): Payer: Medicare HMO | Admitting: Otolaryngology

## 2017-09-27 DIAGNOSIS — K122 Cellulitis and abscess of mouth: Secondary | ICD-10-CM

## 2017-09-27 DIAGNOSIS — G501 Atypical facial pain: Secondary | ICD-10-CM | POA: Diagnosis not present

## 2017-10-02 ENCOUNTER — Other Ambulatory Visit (INDEPENDENT_AMBULATORY_CARE_PROVIDER_SITE_OTHER): Payer: Self-pay | Admitting: Otolaryngology

## 2017-10-02 DIAGNOSIS — D333 Benign neoplasm of cranial nerves: Secondary | ICD-10-CM

## 2017-10-16 ENCOUNTER — Ambulatory Visit (HOSPITAL_COMMUNITY)
Admission: RE | Admit: 2017-10-16 | Discharge: 2017-10-16 | Disposition: A | Discharge status: Home / Self Care | Payer: Medicare HMO | Source: Ambulatory Visit | Attending: Otolaryngology | Admitting: Otolaryngology

## 2017-10-16 DIAGNOSIS — D333 Benign neoplasm of cranial nerves: Secondary | ICD-10-CM | POA: Diagnosis not present

## 2017-10-16 LAB — POCT I-STAT CREATININE: Creatinine, Ser: 0.8 mg/dL (ref 0.44–1.00)

## 2017-10-16 MED ORDER — GADOBENATE DIMEGLUMINE 529 MG/ML IV SOLN
17.0000 mL | Freq: Once | INTRAVENOUS | Status: AC | PRN
  Administered 2017-10-16: 17 mL via INTRAVENOUS

## 2017-10-24 ENCOUNTER — Other Ambulatory Visit: Payer: Self-pay | Admitting: Cardiology

## 2017-12-13 ENCOUNTER — Ambulatory Visit (INDEPENDENT_AMBULATORY_CARE_PROVIDER_SITE_OTHER): Payer: Medicare HMO | Admitting: Otolaryngology

## 2017-12-13 DIAGNOSIS — G501 Atypical facial pain: Secondary | ICD-10-CM | POA: Diagnosis not present

## 2017-12-17 ENCOUNTER — Encounter (HOSPITAL_COMMUNITY): Payer: Self-pay | Admitting: Emergency Medicine

## 2017-12-17 ENCOUNTER — Other Ambulatory Visit: Payer: Self-pay

## 2017-12-17 ENCOUNTER — Emergency Department (HOSPITAL_COMMUNITY): Payer: Medicare HMO

## 2017-12-17 ENCOUNTER — Inpatient Hospital Stay (HOSPITAL_COMMUNITY)
Admission: EM | Admit: 2017-12-17 | Discharge: 2017-12-21 | DRG: 310 | Disposition: A | Discharge status: Home / Self Care | Payer: Medicare HMO | Attending: Internal Medicine | Admitting: Internal Medicine

## 2017-12-17 DIAGNOSIS — I2510 Atherosclerotic heart disease of native coronary artery without angina pectoris: Secondary | ICD-10-CM | POA: Diagnosis present

## 2017-12-17 DIAGNOSIS — I4891 Unspecified atrial fibrillation: Secondary | ICD-10-CM

## 2017-12-17 DIAGNOSIS — I361 Nonrheumatic tricuspid (valve) insufficiency: Secondary | ICD-10-CM | POA: Diagnosis not present

## 2017-12-17 DIAGNOSIS — H409 Unspecified glaucoma: Secondary | ICD-10-CM | POA: Diagnosis present

## 2017-12-17 DIAGNOSIS — I10 Essential (primary) hypertension: Secondary | ICD-10-CM | POA: Diagnosis present

## 2017-12-17 DIAGNOSIS — Z7982 Long term (current) use of aspirin: Secondary | ICD-10-CM | POA: Diagnosis not present

## 2017-12-17 DIAGNOSIS — I4819 Other persistent atrial fibrillation: Secondary | ICD-10-CM | POA: Diagnosis present

## 2017-12-17 DIAGNOSIS — Z6831 Body mass index (BMI) 31.0-31.9, adult: Secondary | ICD-10-CM | POA: Diagnosis not present

## 2017-12-17 DIAGNOSIS — I25119 Atherosclerotic heart disease of native coronary artery with unspecified angina pectoris: Secondary | ICD-10-CM | POA: Diagnosis present

## 2017-12-17 DIAGNOSIS — I152 Hypertension secondary to endocrine disorders: Secondary | ICD-10-CM | POA: Diagnosis present

## 2017-12-17 DIAGNOSIS — E1165 Type 2 diabetes mellitus with hyperglycemia: Secondary | ICD-10-CM | POA: Diagnosis present

## 2017-12-17 DIAGNOSIS — Z79899 Other long term (current) drug therapy: Secondary | ICD-10-CM

## 2017-12-17 DIAGNOSIS — Z955 Presence of coronary angioplasty implant and graft: Secondary | ICD-10-CM

## 2017-12-17 DIAGNOSIS — E1159 Type 2 diabetes mellitus with other circulatory complications: Secondary | ICD-10-CM | POA: Diagnosis present

## 2017-12-17 DIAGNOSIS — E119 Type 2 diabetes mellitus without complications: Secondary | ICD-10-CM

## 2017-12-17 DIAGNOSIS — E669 Obesity, unspecified: Secondary | ICD-10-CM | POA: Diagnosis present

## 2017-12-17 DIAGNOSIS — Z7952 Long term (current) use of systemic steroids: Secondary | ICD-10-CM | POA: Diagnosis not present

## 2017-12-17 DIAGNOSIS — R079 Chest pain, unspecified: Secondary | ICD-10-CM

## 2017-12-17 DIAGNOSIS — R002 Palpitations: Secondary | ICD-10-CM | POA: Diagnosis present

## 2017-12-17 DIAGNOSIS — E1169 Type 2 diabetes mellitus with other specified complication: Secondary | ICD-10-CM

## 2017-12-17 DIAGNOSIS — E782 Mixed hyperlipidemia: Secondary | ICD-10-CM | POA: Diagnosis present

## 2017-12-17 HISTORY — DX: Supraventricular tachycardia, unspecified: I47.10

## 2017-12-17 HISTORY — DX: Supraventricular tachycardia: I47.1

## 2017-12-17 LAB — TROPONIN I
Troponin I: <0.03 ng/mL (ref <0.03)
Troponin I: <0.03 ng/mL (ref <0.03)

## 2017-12-17 LAB — BASIC METABOLIC PANEL
ANION GAP: 10 (ref 5–15)
BUN: 12 mg/dL (ref 8–23)
CALCIUM: 9.5 mg/dL (ref 8.9–10.3)
CO2: 27 mmol/L (ref 22–32)
Chloride: 100 mmol/L (ref 98–111)
Creatinine, Ser: 0.83 mg/dL (ref 0.44–1.00)
GLUCOSE: 234 mg/dL — AB (ref 70–99)
Potassium: 4 mmol/L (ref 3.5–5.1)
Sodium: 137 mmol/L (ref 135–145)

## 2017-12-17 LAB — CBC
HCT: 41.1 % (ref 36.0–46.0)
Hemoglobin: 13.5 g/dL (ref 12.0–15.0)
MCH: 28.9 pg (ref 26.0–34.0)
MCHC: 32.8 g/dL (ref 30.0–36.0)
MCV: 88 fL (ref 80.0–100.0)
NRBC: 0 % (ref 0.0–0.2)
PLATELETS: 232 10*3/uL (ref 150–400)
RBC: 4.67 MIL/uL (ref 3.87–5.11)
RDW: 12.2 % (ref 11.5–15.5)
WBC: 6.2 10*3/uL (ref 4.0–10.5)

## 2017-12-17 LAB — HEMOGLOBIN A1C
HEMOGLOBIN A1C: 7.5 % — AB (ref 4.8–5.6)
MEAN PLASMA GLUCOSE: 168.55 mg/dL

## 2017-12-17 LAB — MRSA PCR SCREENING: MRSA by PCR: NEGATIVE

## 2017-12-17 LAB — LIPID PANEL
CHOL/HDL RATIO: 5 ratio
CHOLESTEROL: 198 mg/dL (ref 0–200)
HDL: 40 mg/dL — AB (ref >40)
LDL Cholesterol: 127 mg/dL — ABNORMAL HIGH (ref 0–99)
Triglycerides: 154 mg/dL — ABNORMAL HIGH (ref <150)
VLDL: 31 mg/dL (ref 0–40)

## 2017-12-17 LAB — MAGNESIUM: MAGNESIUM: 2 mg/dL (ref 1.7–2.4)

## 2017-12-17 LAB — TSH: TSH: 1.422 u[IU]/mL (ref 0.350–4.500)

## 2017-12-17 MED ORDER — TIMOLOL MALEATE 0.5 % OP SOLN
1.0000 [drp] | Freq: Every day | OPHTHALMIC | Status: DC
  Administered 2017-12-17 – 2017-12-21 (×5): 1 [drp] via OPHTHALMIC
  Filled 2017-12-17: qty 5

## 2017-12-17 MED ORDER — INSULIN ASPART 100 UNIT/ML ~~LOC~~ SOLN
0.0000 [IU] | Freq: Three times a day (TID) | SUBCUTANEOUS | Status: DC
  Administered 2017-12-17 – 2017-12-18 (×3): 3 [IU] via SUBCUTANEOUS
  Administered 2017-12-18 – 2017-12-19 (×2): 2 [IU] via SUBCUTANEOUS
  Administered 2017-12-19 – 2017-12-21 (×5): 3 [IU] via SUBCUTANEOUS

## 2017-12-17 MED ORDER — DILTIAZEM LOAD VIA INFUSION
10.0000 mg | Freq: Once | INTRAVENOUS | Status: DC
  Filled 2017-12-17: qty 10

## 2017-12-17 MED ORDER — TRIAMTERENE-HCTZ 37.5-25 MG PO TABS
0.5000 | ORAL_TABLET | Freq: Every day | ORAL | Status: DC
  Administered 2017-12-17 – 2017-12-18 (×2): 0.5 via ORAL
  Filled 2017-12-17 (×3): qty 1

## 2017-12-17 MED ORDER — CHOLECALCIFEROL 10 MCG (400 UNIT) PO TABS
400.0000 [IU] | ORAL_TABLET | Freq: Every day | ORAL | Status: DC
  Administered 2017-12-17 – 2017-12-21 (×5): 400 [IU] via ORAL
  Filled 2017-12-17 (×5): qty 1

## 2017-12-17 MED ORDER — SODIUM CHLORIDE 0.9 % IV SOLN: 250.0000 mL | INTRAVENOUS | Status: DC | PRN

## 2017-12-17 MED ORDER — MUSCLE RUB 10-15 % EX CREA
TOPICAL_CREAM | Freq: Four times a day (QID) | CUTANEOUS | Status: DC | PRN
  Filled 2017-12-17: qty 85

## 2017-12-17 MED ORDER — HYDROCODONE-ACETAMINOPHEN 10-325 MG PO TABS
1.0000 | ORAL_TABLET | Freq: Four times a day (QID) | ORAL | Status: DC | PRN
  Administered 2017-12-18 – 2017-12-21 (×4): 1 via ORAL
  Filled 2017-12-17 (×4): qty 1

## 2017-12-17 MED ORDER — VITAMIN B-12 100 MCG PO TABS
ORAL_TABLET | Freq: Every day | ORAL | Status: DC
  Administered 2017-12-17 – 2017-12-18 (×2): 100 ug via ORAL
  Administered 2017-12-19: 09:00:00 via ORAL
  Administered 2017-12-20 – 2017-12-21 (×2): 100 ug via ORAL
  Filled 2017-12-17 (×2): qty 3
  Filled 2017-12-17: qty 100
  Filled 2017-12-17 (×3): qty 3
  Filled 2017-12-17 (×2): qty 100

## 2017-12-17 MED ORDER — GABAPENTIN 300 MG PO CAPS
300.0000 mg | ORAL_CAPSULE | Freq: Every day | ORAL | Status: DC
  Administered 2017-12-17 – 2017-12-21 (×5): 300 mg via ORAL
  Filled 2017-12-17 (×5): qty 1

## 2017-12-17 MED ORDER — MENTHOL (TOPICAL ANALGESIC) 4 % EX GEL: 1.0000 "application " | Freq: Four times a day (QID) | CUTANEOUS | Status: DC | PRN

## 2017-12-17 MED ORDER — METOPROLOL TARTRATE 5 MG/5ML IV SOLN
5.0000 mg | Freq: Once | INTRAVENOUS | Status: AC
  Administered 2017-12-17: 5 mg via INTRAVENOUS
  Filled 2017-12-17: qty 5

## 2017-12-17 MED ORDER — GARLIC 100 MG PO TABS: 1.0000 | ORAL_TABLET | Freq: Every day | ORAL | Status: DC

## 2017-12-17 MED ORDER — ONDANSETRON HCL 4 MG/2ML IJ SOLN: 4.0000 mg | Freq: Four times a day (QID) | INTRAMUSCULAR | Status: DC | PRN

## 2017-12-17 MED ORDER — DILTIAZEM HCL 25 MG/5ML IV SOLN
10.0000 mg | Freq: Once | INTRAVENOUS | Status: AC
  Administered 2017-12-17: 10 mg via INTRAVENOUS
  Filled 2017-12-17: qty 5

## 2017-12-17 MED ORDER — ASPIRIN 81 MG PO CHEW
324.0000 mg | CHEWABLE_TABLET | Freq: Once | ORAL | Status: AC
  Administered 2017-12-17: 324 mg via ORAL
  Filled 2017-12-17: qty 4

## 2017-12-17 MED ORDER — HYDROCORTISONE 1 % EX CREA
TOPICAL_CREAM | Freq: Two times a day (BID) | CUTANEOUS | Status: DC
  Administered 2017-12-17: 17:00:00 via TOPICAL
  Administered 2017-12-17: 1 via TOPICAL
  Administered 2017-12-18: 21:00:00 via TOPICAL
  Administered 2017-12-18 – 2017-12-19 (×2): 1 via TOPICAL
  Administered 2017-12-19 – 2017-12-21 (×4): via TOPICAL
  Filled 2017-12-17: qty 28

## 2017-12-17 MED ORDER — ZOLPIDEM TARTRATE 5 MG PO TABS
5.0000 mg | ORAL_TABLET | Freq: Every day | ORAL | Status: DC
  Administered 2017-12-17 – 2017-12-20 (×4): 5 mg via ORAL
  Filled 2017-12-17 (×4): qty 1

## 2017-12-17 MED ORDER — MAGNESIUM CARBONATE 250 MG/GM PO POWD: Freq: Every day | ORAL | Status: DC

## 2017-12-17 MED ORDER — NITROGLYCERIN 0.4 MG SL SUBL
0.4000 mg | SUBLINGUAL_TABLET | SUBLINGUAL | Status: DC | PRN
  Administered 2017-12-17: 0.4 mg via SUBLINGUAL
  Filled 2017-12-17: qty 1

## 2017-12-17 MED ORDER — ZOLPIDEM TARTRATE 5 MG PO TABS: 10.0000 mg | ORAL_TABLET | Freq: Every day | ORAL | Status: DC

## 2017-12-17 MED ORDER — NITROGLYCERIN 0.4 MG SL SUBL: 0.4000 mg | SUBLINGUAL_TABLET | SUBLINGUAL | Status: DC | PRN

## 2017-12-17 MED ORDER — SODIUM CHLORIDE 0.9% FLUSH
3.0000 mL | Freq: Two times a day (BID) | INTRAVENOUS | Status: DC
  Administered 2017-12-17 – 2017-12-21 (×9): 3 mL via INTRAVENOUS

## 2017-12-17 MED ORDER — MAGNESIUM OXIDE 400 (241.3 MG) MG PO TABS
200.0000 mg | ORAL_TABLET | Freq: Every day | ORAL | Status: DC
  Administered 2017-12-17 – 2017-12-21 (×5): 200 mg via ORAL
  Filled 2017-12-17 (×6): qty 1

## 2017-12-17 MED ORDER — OMEGA-3-ACID ETHYL ESTERS 1 G PO CAPS
1.0000 g | ORAL_CAPSULE | Freq: Two times a day (BID) | ORAL | Status: DC
  Administered 2017-12-17 – 2017-12-21 (×9): 1 g via ORAL
  Filled 2017-12-17 (×9): qty 1

## 2017-12-17 MED ORDER — ACETAMINOPHEN 325 MG PO TABS: 650.0000 mg | ORAL_TABLET | Freq: Four times a day (QID) | ORAL | Status: DC | PRN

## 2017-12-17 MED ORDER — APIXABAN 5 MG PO TABS
5.0000 mg | ORAL_TABLET | Freq: Two times a day (BID) | ORAL | Status: DC
  Administered 2017-12-17 – 2017-12-21 (×9): 5 mg via ORAL
  Filled 2017-12-17 (×9): qty 1

## 2017-12-17 MED ORDER — PANTOPRAZOLE SODIUM 40 MG PO TBEC
40.0000 mg | DELAYED_RELEASE_TABLET | Freq: Every day | ORAL | Status: DC
  Administered 2017-12-17 – 2017-12-21 (×5): 40 mg via ORAL
  Filled 2017-12-17 (×5): qty 1

## 2017-12-17 MED ORDER — METOPROLOL TARTRATE 50 MG PO TABS
50.0000 mg | ORAL_TABLET | Freq: Two times a day (BID) | ORAL | Status: DC
  Administered 2017-12-17 – 2017-12-18 (×3): 50 mg via ORAL
  Filled 2017-12-17 (×3): qty 1

## 2017-12-17 MED ORDER — INSULIN ASPART 100 UNIT/ML ~~LOC~~ SOLN: 0.0000 [IU] | Freq: Every day | SUBCUTANEOUS | Status: DC

## 2017-12-17 MED ORDER — ACETAMINOPHEN 650 MG RE SUPP: 650.0000 mg | Freq: Four times a day (QID) | RECTAL | Status: DC | PRN

## 2017-12-17 MED ORDER — VITAMIN C 500 MG PO TABS
500.0000 mg | ORAL_TABLET | Freq: Every day | ORAL | Status: DC
  Administered 2017-12-17 – 2017-12-21 (×5): 500 mg via ORAL
  Filled 2017-12-17 (×5): qty 1

## 2017-12-17 MED ORDER — ONDANSETRON HCL 4 MG PO TABS
4.0000 mg | ORAL_TABLET | Freq: Four times a day (QID) | ORAL | Status: DC | PRN
  Administered 2017-12-19 – 2017-12-20 (×2): 4 mg via ORAL
  Filled 2017-12-17 (×2): qty 1

## 2017-12-17 MED ORDER — SODIUM CHLORIDE 0.9% FLUSH: 3.0000 mL | INTRAVENOUS | Status: DC | PRN

## 2017-12-17 MED ORDER — ASPIRIN EC 81 MG PO TBEC
81.0000 mg | DELAYED_RELEASE_TABLET | Freq: Every evening | ORAL | Status: DC
Start: 2017-12-17 — End: 2017-12-19
  Administered 2017-12-17 – 2017-12-18 (×2): 81 mg via ORAL
  Filled 2017-12-17 (×2): qty 1

## 2017-12-17 MED ORDER — LATANOPROST 0.005 % OP SOLN
1.0000 [drp] | Freq: Every day | OPHTHALMIC | Status: DC
  Administered 2017-12-18 – 2017-12-20 (×3): 1 [drp] via OPHTHALMIC
  Filled 2017-12-17: qty 2.5

## 2017-12-17 MED ORDER — FISH OIL 1000 MG PO CAPS: 1.0000 | ORAL_CAPSULE | Freq: Two times a day (BID) | ORAL | Status: DC

## 2017-12-17 NOTE — ED Triage Notes (Signed)
Pt c/o of central chest pain radiating to left shoulder since 0100 this am.  Pt took two nitros at home with no relief.

## 2017-12-17 NOTE — H&P (Signed)
History and Physical    [name] MWN:[id number] DOB: 06/11/1940 DOA: 12/17/2017  PCP: Octavio Graves, DO   Patient coming from: Home  Chief Complaint: Chest pain and palpitations  HPI: [name] is a 77 y.o. female with medical history significant for CAD and prior stents, palpitations and paroxysmal SVT, hypertension, and dyslipidemia who presented to the ED with intermittent left shoulder pain as she was trying to fall asleep last night at approximately 0100.  She states that this progressed to some midsternal chest tightness and she took one sublingual nitroglycerin with partial relief and then took a second 1 approximately 1 hour later and try to lay down, but continued to experience multiple symptoms to include some palpitations as well as diaphoresis.  She denied any nausea, vomiting, or shortness of breath.  No cough, fevers, or chills noted.  She states that she had a similar episode over a week ago that spontaneously resolved.  She states that she has been taking her medications as otherwise prescribed except for her morning doses.   ED Course: Vital signs demonstrate elevated heart rate currently ranging from 90 to 120 bpm on telemetry monitoring.  EKG initially demonstrated atrial fibrillation with heart rate above 130 bpm and she was given 5 mg of IV metoprolol with some initial resolution of her heart rate to 81 bpm on repeat EKG.  Initial troponin less than 0.03.  1 view chest x-ray with some mild bronchiectatic changes noted.  She was given some full dose aspirin as well as nitroglycerin in the ED.  She currently denies any significant symptomatology.  Review of Systems: All others reviewed and otherwise negative.  Past Medical History:  Diagnosis Date  . Arthritis   . Bulging lumbar disc   . Chronic lower back pain   . Coronary atherosclerosis of native coronary artery    DES LAD 02/2008, DES LAD 02/2015  . Depression   . Dyslipidemia   . Essential hypertension,  benign   . Frequent headaches   . Glaucoma   . Palpitations   . PSVT (paroxysmal supraventricular tachycardia) ([place])   . Refusal of blood transfusions as patient is Jehovah's Witness   . Type 2 diabetes mellitus (HCC)    Borderline    Past Surgical History:  Procedure Laterality Date  . CARDIAC CATHETERIZATION N/A 02/23/2015   Procedure: Left Heart Cath and Coronary Angiography;  Surgeon: Sherren Mocha, MD;  Location: [facility];  Service: Cardiovascular;  Laterality: N/A;  . CARDIAC CATHETERIZATION N/A 02/23/2015   Procedure: Coronary Stent Intervention;  Surgeon: Sherren Mocha, MD;  Location: [facility];  Service: Cardiovascular;  Laterality: N/A;  . CARDIAC CATHETERIZATION N/A 01/04/2016   Procedure: Left Heart Cath and Coronary Angiography;  Surgeon: Peter M Martinique, MD;  Location: [facility];  Service: Cardiovascular;  Laterality: N/A;  . CORONARY ANGIOPLASTY  02/23/2015  . CORONARY ANGIOPLASTY WITH STENT PLACEMENT  2009  . DILATION AND CURETTAGE OF UTERUS    . TUBAL LIGATION       reports that she has never smoked. She has never used smokeless tobacco. She reports that she does not drink alcohol or use drugs.  No Known Allergies  Family History  Problem Relation Age of Onset  . Heart attack Mother   . Cancer Father     Prior to Admission medications   Medication Sig Start Date End Date Taking? Authorizing Provider  Ascorbic Acid (VITAMIN C) 500 MG tablet Take 500 mg by mouth  daily.      [provider]  aspirin EC 81 MG tablet Take 81 mg by mouth every evening.    [provider]  bimatoprost (LUMIGAN) 0.01 % SOLN Place 1 drop into both eyes at bedtime.    [provider]  Calcium Carbonate Antacid (TUMS PO) Take 2 tablets by mouth 4 (four) times daily as needed (for acid reflux/indigestion).     [provider]  Cholecalciferol (VITAMIN D) 400 UNITS capsule Take 400 Units by mouth daily.      [provider]  Cyanocobalamin (VITAMIN B 12 PO) Take 1 tablet by mouth daily.    [provider]  gabapentin (NEURONTIN) 300 MG capsule Take 1 capsule by mouth daily. 11/13/17   [provider]  Garlic 098 MG TABS Take 1 tablet by mouth daily.      [provider]  HYDROcodone-acetaminophen (NORCO) 10-325 MG tablet Take 1 tablet by mouth 4 (four) times daily as needed. For pain. 10/29/15   [provider]  hydrocortisone 2.5 % cream Apply 2 (two) times daily topically. 01/01/17   Lendon Colonel, NP  ibuprofen (ADVIL,MOTRIN) 200 MG tablet Take 600 mg by mouth every 6 (six) hours as needed (for pain.).     [provider]  MAGNESIUM CARBONATE PO Take 1 tablet by mouth daily.    [provider]  Menthol, Topical Analgesic, (BIOFREEZE) 4 % GEL Apply 1 application topically 4 (four) times daily as needed (for pain).     [provider]  metoprolol tartrate (LOPRESSOR) 50 MG tablet TAKE 1 AND 1/2 TABLET TWICE A DAY 08/29/17   Lendon Colonel, NP  nitroGLYCERIN (NITROSTAT) 0.4 MG SL tablet Place 0.4 mg under the tongue every 5 (five) minutes as needed for chest pain.    [provider]  Omega-3 Fatty Acids (FISH OIL) 1000 MG CAPS Take 1 capsule by mouth 2 (two) times daily.      [provider]  pantoprazole (PROTONIX) 40 MG tablet TAKE ONE (1) TABLET EACH DAY 10/24/17   Satira Sark, MD  timolol (TIMOPTIC) 0.5 % ophthalmic solution Place 1 drop into both eyes daily.    [provider]  triamterene-hydrochlorothiazide (MAXZIDE-25) 37.5-25 MG per tablet Take 0.5 tablets by mouth daily.    [provider]  VENTOLIN HFA 108 (90 Base) MCG/ACT inhaler Inhale 1-2 puffs into the lungs every 4 (four) hours as needed for shortness of breath. 12/21/15   [provider]  zolpidem (AMBIEN) 10 MG tablet Take 10 mg by mouth at bedtime.     [provider]    Physical Exam: Vitals:   12/17/17 1045  12/17/17 1100 12/17/17 1115 12/17/17 1130  BP: 126/63 136/75 (!) 151/81 (!) 145/65  Pulse: 62 79 (!) 130 (!) 54  Resp: 14 14 18 14   Temp:      TempSrc:      SpO2:      Weight:      Height:        Constitutional: NAD, calm, comfortable Vitals:   12/17/17 1045 12/17/17 1100 12/17/17 1115 12/17/17 1130  BP: 126/63 136/75 (!) 151/81 (!) 145/65  Pulse: 62 79 (!) 130 (!) 54  Resp: 14 14 18 14   Temp:      TempSrc:      SpO2:      Weight:      Height:       Eyes: lids and conjunctivae normal ENMT: Mucous membranes are moist.  Neck: normal, supple Respiratory: clear to auscultation bilaterally. Normal respiratory effort. No accessory muscle use.  Cardiovascular: Irregular rate and rhythm, tachycardic, no murmurs. No extremity edema. Abdomen: no tenderness, no distention. Bowel sounds positive.  Musculoskeletal:  No joint deformity upper and lower extremities.   Skin: no rashes, lesions, ulcers.  Psychiatric: Normal judgment and insight. Alert and oriented x 3. Normal mood.   Labs on Admission: I have personally reviewed following labs and imaging studies  CBC: Recent Labs  Lab 12/17/17 0943  WBC 6.2  HGB 13.5  HCT 41.1  MCV 88.0  PLT 357   Basic Metabolic Panel: Recent Labs  Lab 12/17/17 0943  NA 137  K 4.0  CL 100  CO2 27  GLUCOSE 234*  BUN 12  CREATININE 0.83  CALCIUM 9.5   GFR: Estimated Creatinine Clearance: 59.1 mL/min (by C-G formula based on SCr of 0.83 mg/dL). Liver Function Tests: No results for input(s): AST, ALT, ALKPHOS, BILITOT, PROT, ALBUMIN in the last 168 hours. No results for input(s): LIPASE, AMYLASE in the last 168 hours. No results for input(s): AMMONIA in the last 168 hours. Coagulation Profile: No results for input(s): INR, PROTIME in the last 168 hours. Cardiac Enzymes: Recent Labs  Lab 12/17/17 0943  TROPONINI <0.03   BNP (last 3 results) No results for input(s): PROBNP in the last 8760 hours. HbA1C: No results for input(s):  HGBA1C in the last 72 hours. CBG: No results for input(s): GLUCAP in the last 168 hours. Lipid Profile: No results for input(s): CHOL, HDL, LDLCALC, TRIG, CHOLHDL, LDLDIRECT in the last 72 hours. Thyroid Function Tests: No results for input(s): TSH, T4TOTAL, FREET4, T3FREE, THYROIDAB in the last 72 hours. Anemia Panel: No results for input(s): VITAMINB12, FOLATE, FERRITIN, TIBC, IRON, RETICCTPCT in the last 72 hours. Urine analysis:    Component Value Date/Time   COLORURINE YELLOW 04/12/2010 1758   APPEARANCEUR CLEAR 04/12/2010 1758   LABSPEC 1.011 04/12/2010 1758   PHURINE 7.0 04/12/2010 1758   HGBUR NEGATIVE 04/12/2010 1758   BILIRUBINUR NEGATIVE 04/12/2010 1758   KETONESUR NEGATIVE 04/12/2010 1758   PROTEINUR NEGATIVE 04/12/2010 1758   UROBILINOGEN 0.2 04/12/2010 1758   NITRITE NEGATIVE 04/12/2010 1758   LEUKOCYTESUR  04/12/2010 1758    NEGATIVE MICROSCOPIC NOT DONE ON URINES WITH NEGATIVE PROTEIN, BLOOD, LEUKOCYTES, NITRITE, OR GLUCOSE <1000 mg/dL.    Radiological Exams on Admission: Dg Chest Port 1 View  Result Date: 12/17/2017 CLINICAL DATA:  Central chest pain EXAM: PORTABLE CHEST 1 VIEW COMPARISON:  05/12/2016 FINDINGS: Mild peribronchial thickening. Heart and mediastinal contours are within normal limits. No focal opacities or effusions. No acute bony abnormality. IMPRESSION: Mild bronchitic changes. Electronically Signed   By: Rolm Baptise M.D.   On: 12/17/2017 09:59    EKG: Independently reviewed. Afib at  81bpm.   Assessment/Plan Principal Problem:   Atrial fibrillation with RVR (HCC) Active Problems:   Mixed hyperlipidemia   Essential hypertension   Coronary atherosclerosis of native coronary artery   Chest pain    1. New onset atrial fibrillation with RVR.  Will check TSH and 2D echocardiogram as prior echo was in 2017.  She is noted to have paroxysmal SVT and palpitations related to this in the past.  Chads score is currently 6, and therefore will initiate  Eliquis for stroke prophylaxis.  Will bolus Cardizem x1 dose and monitor in stepdown unit in case infusion is required, otherwise could start oral Cardizem 30 mg every 6 hours as it appears her blood pressure could  tolerate this.  Prior LVEF in 2017 was noted to be 65 to 70%. 2. Chest pain and palpitations.  Likely related to above.  She does have history of CAD and should remain on aspirin 81 mg as well as metoprolol.  Consult to cardiology for assistance in management for new onset atrial fibrillation as well as chest pain appreciated.  Continue to monitor cardiac enzymes. 3. Essential hypertension.  Continue metoprolol as well as Maxzide.  Monitor carefully. 4. History of dyslipidemia.  Does not appear to be on statin and will check lipid profile. 5. Hyperglycemia with history of borderline type 2 diabetes.  Check hemoglobin A1c and place on sliding scale insulin with carb modified diet.   DVT prophylaxis: Eliquis Code Status: Full Family Communication: Daughter at bedside Disposition Plan: Heart rate control and cardiology evaluation Consults called: Cardiology Admission status: Inpatient, stepdown unit   [name] Darleen Crocker DO [facility] Pager [phone]  If 7PM-7AM, please contact night-coverage [email or web address] Password [email or web address]  12/17/2017, 12:00 PM

## 2017-12-17 NOTE — Progress Notes (Signed)
ANTICOAGULATION CONSULT NOTE - Initial Consult  Pharmacy Consult for Eliquis Indication: atrial fibrillation  No Known Allergies  Patient Measurements: Height: 5\' 4"  (162.6 cm) Weight: 184 lb 8.4 oz (83.7 kg) IBW/kg (Calculated) : 54.7  Vital Signs: Temp: 97.4 F (36.3 C) (10/28 1217) Temp Source: Oral (10/28 1217) BP: 138/74 (10/28 1217) Pulse Rate: 105 (10/28 1217)  Labs: Recent Labs    12/17/17 0943  HGB 13.5  HCT 41.1  PLT 232  CREATININE 0.83  TROPONINI <0.03    Estimated Creatinine Clearance: 59.4 mL/min (by C-G formula based on SCr of 0.83 mg/dL).   Medical History: Past Medical History:  Diagnosis Date  . Arthritis   . Bulging lumbar disc   . Chronic lower back pain   . Coronary atherosclerosis of native coronary artery    DES LAD 02/2008, DES LAD 02/2015  . Depression   . Dyslipidemia   . Essential hypertension, benign   . Frequent headaches   . Glaucoma   . Palpitations   . PSVT (paroxysmal supraventricular tachycardia) ([place])   . Refusal of blood transfusions as patient is Jehovah's Witness   . Type 2 diabetes mellitus (HCC)    Borderline    Medications:  Medications Prior to Admission  Medication Sig Dispense Refill Last Dose  . Ascorbic Acid (VITAMIN C) 500 MG tablet Take 500 mg by mouth daily.     Taking  . aspirin EC 81 MG tablet Take 81 mg by mouth every evening.   Taking  . bimatoprost (LUMIGAN) 0.01 % SOLN Place 1 drop into both eyes at bedtime.   Taking  . Calcium Carbonate Antacid (TUMS PO) Take 2 tablets by mouth 4 (four) times daily as needed (for acid reflux/indigestion).    Taking  . Cholecalciferol (VITAMIN D) 400 UNITS capsule Take 400 Units by mouth daily.     Taking  . Cyanocobalamin (VITAMIN B 12 PO) Take 1 tablet by mouth daily.   Taking  . gabapentin (NEURONTIN) 300 MG capsule Take 1 capsule by mouth daily.     . Garlic 962 MG TABS Take 1 tablet by mouth daily.     Taking  . HYDROcodone-acetaminophen (NORCO) 10-325 MG tablet  Take 1 tablet by mouth 4 (four) times daily as needed. For pain.   Taking  . hydrocortisone 2.5 % cream Apply 2 (two) times daily topically. 30 g 0 Taking  . ibuprofen (ADVIL,MOTRIN) 200 MG tablet Take 600 mg by mouth every 6 (six) hours as needed (for pain.).    Taking  . MAGNESIUM CARBONATE PO Take 1 tablet by mouth daily.   Taking  . Menthol, Topical Analgesic, (BIOFREEZE) 4 % GEL Apply 1 application topically 4 (four) times daily as needed (for pain).    Taking  . metoprolol tartrate (LOPRESSOR) 50 MG tablet TAKE 1 AND 1/2 TABLET TWICE A DAY 180 tablet 3   . nitroGLYCERIN (NITROSTAT) 0.4 MG SL tablet Place 0.4 mg under the tongue every 5 (five) minutes as needed for chest pain.   Taking  . Omega-3 Fatty Acids (FISH OIL) 1000 MG CAPS Take 1 capsule by mouth 2 (two) times daily.     Taking  . pantoprazole (PROTONIX) 40 MG tablet TAKE ONE (1) TABLET EACH DAY 90 tablet 2   . timolol (TIMOPTIC) 0.5 % ophthalmic solution Place 1 drop into both eyes daily.   Taking  . triamterene-hydrochlorothiazide (MAXZIDE-25) 37.5-25 MG per tablet Take 0.5 tablets by mouth daily.   Taking  . VENTOLIN HFA 108 (90 Base)  MCG/ACT inhaler Inhale 1-2 puffs into the lungs every 4 (four) hours as needed for shortness of breath.   Taking  . zolpidem (AMBIEN) 10 MG tablet Take 10 mg by mouth at bedtime.    Taking    Assessment: 77 y.o. female with medical history significant for CAD and prior stents, palpitations and paroxysmal SVT, hypertension, and dyslipidemia who presented to the ED with intermittent left shoulder pain. The pain progressed to some midsternal chest tightness  And palpitations. Patient with new onset atrial fibrillation. Initiating eliquis for afib.  Goal of Therapy:  Monitor platelets by anticoagulation protocol: Yes   Plan:  Eliquis 5mg  po BID Educate on eliquis Monitor for S/S of bleeding   Isac Sarna, BS Vena [place], BCPS Clinical Pharmacist Pager [phone] 12/17/2017,12:39 PM

## 2017-12-17 NOTE — ED Provider Notes (Signed)
[facility] EMERGENCY DEPARTMENT Provider Note   CSN: [id number] Arrival date & time: 12/17/17  4403     History   Chief Complaint Chief Complaint  Patient presents with  . Chest Pain    HPI [name] is a 77 y.o. female.  HPI  Pt was seen at 0935. Per pt, c/o gradual onset and persistence of waxing and waning multiple symptoms that began approximately 0100 overnight last night. Pt states she was laying down to try to sleep and her left shoulder started "bothering" her and she became diaphoretic. Pt states she got up out of bed and developed mid-sternal chest "tightness." Pt states she sat down and took 1 SL ntg with partial relief, then took a 2nd SL ntg approximately 1 hour later and laid down. Pt states she was also having intermittent palpitations while she was sitting and taking her ntg. Pt states her symptoms improved after she laid down, but returned again at 0400 this morning. Pt has not taken any of her medications today. Denies back pain, no N/V/D, no SOB/cough, no rash, no fevers.      Past Medical History:  Diagnosis Date  . Arthritis   . Bulging lumbar disc   . Chronic lower back pain   . Coronary atherosclerosis of native coronary artery    DES LAD 02/2008, DES LAD 02/2015  . Depression   . Dyslipidemia   . Essential hypertension, benign   . Frequent headaches   . Glaucoma   . Palpitations   . PSVT (paroxysmal supraventricular tachycardia) ([place])   . Refusal of blood transfusions as patient is Jehovah's Witness   . Type 2 diabetes mellitus ([place])    Borderline    Patient Active Problem List   Diagnosis Date Noted  . Accelerating angina ([place])   . Exertional angina ([facility]) 02/23/2015  . Chest pain 11/01/2012  . Mixed hyperlipidemia 11/12/2009  . Essential hypertension 11/12/2009  . Coronary atherosclerosis of native coronary artery 09/25/2008    Past Surgical History:  Procedure Laterality Date  . CARDIAC CATHETERIZATION N/A 02/23/2015   Procedure:  Left Heart Cath and Coronary Angiography;  Surgeon: Sherren Mocha, MD;  Location: [place] [facility];  Service: Cardiovascular;  Laterality: N/A;  . CARDIAC CATHETERIZATION N/A 02/23/2015   Procedure: Coronary Stent Intervention;  Surgeon: Sherren Mocha, MD;  Location: [place] [facility];  Service: Cardiovascular;  Laterality: N/A;  . CARDIAC CATHETERIZATION N/A 01/04/2016   Procedure: Left Heart Cath and Coronary Angiography;  Surgeon: Peter M Martinique, MD;  Location: [place] [facility];  Service: Cardiovascular;  Laterality: N/A;  . CORONARY ANGIOPLASTY  02/23/2015  . CORONARY ANGIOPLASTY WITH STENT PLACEMENT  2009  . DILATION AND CURETTAGE OF UTERUS    . TUBAL LIGATION       OB History   None      Home Medications    Prior to Admission medications   Medication Sig Start Date End Date Taking? Authorizing Provider  Ascorbic Acid (VITAMIN C) 500 MG tablet Take 500 mg by mouth daily.      [provider]  aspirin EC 81 MG tablet Take 81 mg by mouth every evening.    [provider]  bimatoprost (LUMIGAN) 0.01 % SOLN Place 1 drop into both eyes at bedtime.    [provider]  Calcium Carbonate Antacid (TUMS PO) Take 2 tablets by mouth 4 (four) times daily as needed (for acid reflux/indigestion).     [provider]  Cholecalciferol (VITAMIN D)  400 UNITS capsule Take 400 Units by mouth daily.      [provider]  Cyanocobalamin (VITAMIN B 12 PO) Take 1 tablet by mouth daily.    [provider]  Garlic 419 MG TABS Take 1 tablet by mouth daily.      [provider]  HYDROcodone-acetaminophen (NORCO) 10-325 MG tablet Take 1 tablet by mouth 4 (four) times daily as needed. For pain. 10/29/15   [provider]  hydrocortisone 2.5 % cream Apply 2 (two) times daily topically. 01/01/17   Lendon Colonel, NP  ibuprofen (ADVIL,MOTRIN) 200 MG tablet Take 600 mg by mouth every 6 (six) hours as needed (for pain.).     [provider]  MAGNESIUM CARBONATE PO Take 1 tablet by mouth daily.    [provider]  Menthol, Topical Analgesic, (BIOFREEZE) 4 % GEL Apply 1 application topically 4 (four) times daily as needed (for pain).     [provider]  metoprolol tartrate (LOPRESSOR) 50 MG tablet TAKE 1 AND 1/2 TABLET TWICE A DAY 08/29/17   Lendon Colonel, NP  nitroGLYCERIN (NITROSTAT) 0.4 MG SL tablet Place 0.4 mg under the tongue every 5 (five) minutes as needed for chest pain.    [provider]  Omega-3 Fatty Acids (FISH OIL) 1000 MG CAPS Take 1 capsule by mouth 2 (two) times daily.      [provider]  pantoprazole (PROTONIX) 40 MG tablet TAKE ONE (1) TABLET EACH DAY 10/24/17   Satira Sark, MD  timolol (TIMOPTIC) 0.5 % ophthalmic solution Place 1 drop into both eyes daily.    [provider]  triamterene-hydrochlorothiazide (MAXZIDE-25) 37.5-25 MG per tablet Take 0.5 tablets by mouth daily.    [provider]  VENTOLIN HFA 108 (90 Base) MCG/ACT inhaler Inhale 1-2 puffs into the lungs every 4 (four) hours as needed for shortness of breath. 12/21/15   [provider]  zolpidem (AMBIEN) 10 MG tablet Take 10 mg by mouth at bedtime.     [provider]    Family History Family History  Problem Relation Age of Onset  . Heart attack Mother   . Cancer Father     Social History Social History   Tobacco Use  . Smoking status: Never Smoker  . Smokeless tobacco: Never Used  Substance Use Topics  . Alcohol use: No    Alcohol/week: 0.0 standard drinks  . Drug use: No     Allergies   Patient has no known allergies.   Review of Systems Review of Systems ROS: Statement: All systems negative except as marked or noted in the HPI; Constitutional: Negative for fever and chills. ; ; Eyes: Negative for eye pain, redness and discharge. ; ; ENMT: Negative for ear pain, hoarseness, nasal congestion, sinus pressure and sore throat. ; ;  Cardiovascular: +CP, diaphoresis, palpitations. Negative for dyspnea and peripheral edema. ; ; Respiratory: Negative for cough, wheezing and stridor. ; ; Gastrointestinal: Negative for nausea, vomiting, diarrhea, abdominal pain, blood in stool, hematemesis, jaundice and rectal bleeding. . ; ; Genitourinary: Negative for dysuria, flank pain and hematuria. ; ; Musculoskeletal: Negative for back pain and neck pain. Negative for swelling and trauma.; ; Skin: Negative for pruritus, rash, abrasions, blisters, bruising and skin lesion.; ; Neuro: Negative for headache, lightheadedness and neck stiffness. Negative for weakness, altered level of consciousness, altered mental status, extremity weakness, paresthesias, involuntary movement, seizure and syncope.       Physical Exam Updated Vital Signs BP Marland Kitchen)  148/79 (BP Location: Right Arm)   Pulse 89   Temp 98 F (36.7 C) (Oral)   Resp 20   Ht 5\' 4"  (1.626 m)   Wt 83 kg   LMP  (LMP Unknown)   SpO2 99%   BMI 31.41 kg/m     Patient Vitals for the past 24 hrs:  BP Temp Temp src Pulse Resp SpO2 Height Weight  12/17/17 1030 130/81 - - 80 13 - - -  12/17/17 1015 (!) 143/71 - - 91 15 - - -  12/17/17 1000 115/65 - - 85 19 - - -  12/17/17 0945 - - - 83 16 - - -  12/17/17 0930 135/77 - - - 15 - - -  12/17/17 0923 (!) 148/79 98 F (36.7 C) Oral 89 20 99 % - -  12/17/17 2706 - - - - - - 5\' 4"  (1.626 m) 83 kg     Physical Exam 0940: Physical examination:  Nursing notes reviewed; Vital signs and O2 SAT reviewed;  Constitutional: Well developed, Well nourished, Well hydrated, In no acute distress; Head:  Normocephalic, atraumatic; Eyes: EOMI, PERRL, No scleral icterus; ENMT: Mouth and pharynx normal, Mucous membranes moist; Neck: Supple, Full range of motion, No lymphadenopathy; Cardiovascular: Irregular rate and rhythm, No gallop; Respiratory: Breath sounds clear & equal bilaterally, No wheezes.  Speaking full sentences with ease, Normal respiratory  effort/excursion; Chest: Nontender, Movement normal; Abdomen: Soft, Nontender, Nondistended, Normal bowel sounds; Genitourinary: No CVA tenderness; Extremities: Peripheral pulses normal, No tenderness, No edema, No calf edema or asymmetry.; Neuro: AA&Ox3, Major CN grossly intact.  Speech clear. No gross focal motor or sensory deficits in extremities.; Skin: Color normal, Warm, Dry.    ED Treatments / Results  Labs (all labs ordered are listed, but only abnormal results are displayed)   EKG EKG Interpretation  Date/Time:  Monday December 17 2017 09:27:09 EDT Ventricular Rate:  138 PR Interval:    QRS Duration: 90 QT Interval:  314 QTC Calculation: 476 R Axis:   4 Text Interpretation:  Atrial fibrillation When compared with ECG of 02/24/2015 Atrial fibrillation has replaced Normal sinus rhythm Confirmed by Francine Graven [id number]) on 12/17/2017 9:45:23 AM    EKG Interpretation  Date/Time:  Monday December 17 2017 10:03:21 EDT Ventricular Rate:  81 PR Interval:    QRS Duration: 94 QT Interval:  351 QTC Calculation: 408 R Axis:   15 Text Interpretation:  Atrial fibrillation Low voltage, precordial leads Baseline wander Since last tracing of earlier today Rate slower Confirmed by Francine Graven [phone]) on 12/17/2017 11:20:38 AM          Radiology   Procedures Procedures (including critical care time)  Medications Ordered in ED Medications  nitroGLYCERIN (NITROSTAT) SL tablet 0.4 mg (0.4 mg Sublingual Given 12/17/17 0949)  aspirin chewable tablet 324 mg (324 mg Oral Given 12/17/17 0948)  metoprolol tartrate (LOPRESSOR) injection 5 mg (5 mg Intravenous Given 12/17/17 0949)     Initial Impression / Assessment and Plan / ED Course  I have reviewed the triage vital signs and the nursing notes.  Pertinent labs & imaging results that were available during my care of the patient were reviewed by me and considered in my medical decision making (see chart for  details).  MDM Reviewed: previous chart, nursing note and vitals Reviewed previous: labs and ECG Interpretation: labs, ECG and x-ray Total time providing critical care: 30-74 minutes. This excludes time spent performing separately reportable procedures and services. Consults: cardiology and admitting MD  CRITICAL CARE Performed by: Francine Graven Total critical care time: 35 minutes Critical care time was exclusive of separately billable procedures and treating other patients. Critical care was necessary to treat or prevent imminent or life-threatening deterioration. Critical care was time spent personally by me on the following activities: development of treatment plan with patient and/or surrogate as well as nursing, discussions with consultants, evaluation of patient's response to treatment, examination of patient, obtaining history from patient or surrogate, ordering and performing treatments and interventions, ordering and review of laboratory studies, ordering and review of radiographic studies, pulse oximetry and re-evaluation of patient's condition.   Results for orders placed or performed during the hospital encounter of 79/39/03  Basic metabolic panel  Result Value Ref Range   Sodium 137 135 - 145 mmol/L   Potassium 4.0 3.5 - 5.1 mmol/L   Chloride 100 98 - 111 mmol/L   CO2 27 22 - 32 mmol/L   Glucose, Bld 234 (H) 70 - 99 mg/dL   BUN 12 8 - 23 mg/dL   Creatinine, Ser 0.83 0.44 - 1.00 mg/dL   Calcium 9.5 8.9 - 10.3 mg/dL   GFR calc non Af Amer >60 >60 mL/min   GFR calc Af Amer >60 >60 mL/min   Anion gap 10 5 - 15  CBC  Result Value Ref Range   WBC 6.2 4.0 - 10.5 K/uL   RBC 4.67 3.87 - 5.11 MIL/uL   Hemoglobin 13.5 12.0 - 15.0 g/dL   HCT 41.1 36.0 - 46.0 %   MCV 88.0 80.0 - 100.0 fL   MCH 28.9 26.0 - 34.0 pg   MCHC 32.8 30.0 - 36.0 g/dL   RDW 12.2 11.5 - 15.5 %   Platelets 232 150 - 400 K/uL   nRBC 0.0 0.0 - 0.2 %  Troponin I  Result Value Ref Range   Troponin  I <0.03 <0.03 ng/mL   Dg Chest Port 1 View Result Date: 12/17/2017 CLINICAL DATA:  Central chest pain EXAM: PORTABLE CHEST 1 VIEW COMPARISON:  05/12/2016 FINDINGS: Mild peribronchial thickening. Heart and mediastinal contours are within normal limits. No focal opacities or effusions. No acute bony abnormality. IMPRESSION: Mild bronchitic changes. Electronically Signed   By: Rolm Baptise M.D.   On: 12/17/2017 09:59    1110:  HR 100-140's, afib on monitor. Pt given IV metoprolol with HR improving 80-100's. BP stable. ASA and SL ntg given with improvement in chest tightness. Will admit.   T/C returned from [facility] Dr. Manuella Ghazi, case discussed, including:  HPI, pertinent PM/SHx, VS/PE, dx testing, ED course and treatment:  Agreeable to admit.      Final Clinical Impressions(s) / ED Diagnoses   Final diagnoses:  Chest pain    ED Discharge Orders    None       Francine Graven, DO 12/18/17 2050

## 2017-12-17 NOTE — ED Notes (Signed)
Dr. Thurnell Garbe at bedisde.

## 2017-12-18 ENCOUNTER — Inpatient Hospital Stay (HOSPITAL_COMMUNITY): Payer: Medicare HMO

## 2017-12-18 DIAGNOSIS — I4891 Unspecified atrial fibrillation: Secondary | ICD-10-CM

## 2017-12-18 DIAGNOSIS — I361 Nonrheumatic tricuspid (valve) insufficiency: Secondary | ICD-10-CM

## 2017-12-18 LAB — GLUCOSE, CAPILLARY
GLUCOSE-CAPILLARY: 173 mg/dL — AB (ref 70–99)
GLUCOSE-CAPILLARY: 126 mg/dL — AB (ref 70–99)
GLUCOSE-CAPILLARY: 166 mg/dL — AB (ref 70–99)
Glucose-Capillary: 188 mg/dL — ABNORMAL HIGH (ref 70–99)
Glucose-Capillary: 164 mg/dL — ABNORMAL HIGH (ref 70–99)
Glucose-Capillary: 163 mg/dL — ABNORMAL HIGH (ref 70–99)

## 2017-12-18 LAB — BASIC METABOLIC PANEL
ANION GAP: 10 (ref 5–15)
BUN: 12 mg/dL (ref 8–23)
CALCIUM: 9.1 mg/dL (ref 8.9–10.3)
CO2: 26 mmol/L (ref 22–32)
Chloride: 100 mmol/L (ref 98–111)
Creatinine, Ser: 0.98 mg/dL (ref 0.44–1.00)
GFR calc Af Amer: >60 mL/min (ref >60)
GFR calc non Af Amer: 54 mL/min — ABNORMAL LOW (ref >60)
Glucose, Bld: 172 mg/dL — ABNORMAL HIGH (ref 70–99)
Potassium: 3.8 mmol/L (ref 3.5–5.1)
Sodium: 136 mmol/L (ref 135–145)

## 2017-12-18 LAB — ECHOCARDIOGRAM COMPLETE
HEIGHTINCHES: 64 in
WEIGHTICAEL: 2952.4 [oz_av]

## 2017-12-18 LAB — CBC
HCT: 39 % (ref 36.0–46.0)
HEMOGLOBIN: 12.6 g/dL (ref 12.0–15.0)
MCH: 28.8 pg (ref 26.0–34.0)
MCHC: 32.3 g/dL (ref 30.0–36.0)
MCV: 89 fL (ref 80.0–100.0)
Platelets: 226 10*3/uL (ref 150–400)
RBC: 4.38 MIL/uL (ref 3.87–5.11)
RDW: 12.2 % (ref 11.5–15.5)
WBC: 7.2 10*3/uL (ref 4.0–10.5)
nRBC: 0 % (ref 0.0–0.2)

## 2017-12-18 LAB — TROPONIN I: Troponin I: <0.03 ng/mL (ref <0.03)

## 2017-12-18 MED ORDER — BISACODYL 5 MG PO TBEC
10.0000 mg | DELAYED_RELEASE_TABLET | Freq: Once | ORAL | Status: AC
  Administered 2017-12-18: 10 mg via ORAL
  Filled 2017-12-18: qty 2

## 2017-12-18 MED ORDER — DILTIAZEM LOAD VIA INFUSION
10.0000 mg | Freq: Once | INTRAVENOUS | Status: AC
  Administered 2017-12-18: 10 mg via INTRAVENOUS
  Filled 2017-12-18: qty 10

## 2017-12-18 MED ORDER — DILTIAZEM HCL 100 MG IV SOLR
5.0000 mg/h | INTRAVENOUS | Status: DC
  Administered 2017-12-18: 5 mg/h via INTRAVENOUS
  Filled 2017-12-18: qty 100

## 2017-12-18 MED ORDER — METOPROLOL TARTRATE 50 MG PO TABS
75.0000 mg | ORAL_TABLET | Freq: Two times a day (BID) | ORAL | Status: DC
  Administered 2017-12-18: 75 mg via ORAL
  Filled 2017-12-18 (×2): qty 1

## 2017-12-18 MED ORDER — METOPROLOL TARTRATE 25 MG PO TABS
25.0000 mg | ORAL_TABLET | Freq: Once | ORAL | Status: AC
  Administered 2017-12-18: 25 mg via ORAL
  Filled 2017-12-18: qty 1

## 2017-12-18 NOTE — Progress Notes (Signed)
PROGRESS NOTE    [name]  WLN:[id number] DOB: 09/07/1940 DOA: 12/17/2017 PCP: Octavio Graves, DO   Brief Narrative:  Per HPI: [name] is a 77 y.o. female with medical history significant for CAD and prior stents, palpitations and paroxysmal SVT, hypertension, and dyslipidemia who presented to the ED with intermittent left shoulder pain as she was trying to fall asleep last night at approximately 0100.  She states that this progressed to some midsternal chest tightness and she took one sublingual nitroglycerin with partial relief and then took a second 1 approximately 1 hour later and try to lay down, but continued to experience multiple symptoms to include some palpitations as well as diaphoresis.  She denied any nausea, vomiting, or shortness of breath.  No cough, fevers, or chills noted.  She states that she had a similar episode over a week ago that spontaneously resolved.  She states that she has been taking her medications as otherwise prescribed except for her morning doses.  She was admitted with atrial fibrillation with RVR and initially received a bolus dose of Cardizem with no significant improvement and has been placed on drip this morning.  Cardiology following with plans to increase beta-blocker dose to help wean off drip.   Assessment & Plan:   Principal Problem:   Atrial fibrillation with RVR ([facility]) Active Problems:   Mixed hyperlipidemia   Essential hypertension   Coronary atherosclerosis of native coronary artery   Chest pain   1. New onset atrial fibrillation with RVR-improving.    TSH within normal limits and 2D echo will be repeated once heart rate is better controlled.  Wean off Cardizem drip at this time as her heart rate and blood pressure appears to be dropping.  Appreciate cardiology recommendations to increase dose of metoprolol. Prior LVEF in 2017 was noted to be 65 to 70%. 2. Chest pain and palpitations.  Likely related to above.  She does have  history of CAD and it appears that this has resolved.  Enzymes are within normal limits. 3. Essential hypertension.  Continue metoprolol as well as Maxzide.  Monitor carefully. 4. History of dyslipidemia.  Does not appear to be on statin and will check lipid profile. 5. Hyperglycemia with history of borderline type 2 diabetes.    Hemoglobin A1c at 7.5%.  Will likely need metformin on discharge and further follow-up outpatient.  Maintain on SSI and carb modified diet.  Blood glucose control currently improved.   DVT prophylaxis: Eliquis Code Status: Full Family Communication: Daughter at bedside Disposition Plan: Per cardiology with weaning of Cardizem drip   Consultants:   Cardiology-Dr. Harl Bowie  Procedures:   None  Antimicrobials:   None   Subjective: Patient seen and evaluated today with no new acute complaints or concerns. No acute concerns or events noted overnight.  She denies any further shortness of breath or chest pain this morning.  Objective: Vitals:   12/18/17 0944 12/18/17 1135 12/18/17 1143 12/18/17 1249  BP: (!) 115/56 (!) 100/57    Pulse: 93 87  (!) 53  Resp:    13  Temp:   97.6 F (36.4 C)   TempSrc:   Oral   SpO2:      Weight:      Height:        Intake/Output Summary (Last 24 hours) at 12/18/2017 1333 Last data filed at 12/18/2017 1300 Gross per 24 hour  Intake 511.67 ml  Output -  Net 511.67 ml   [employer]  12/17/17 0922 12/17/17 1217  Weight: 83 kg 83.7 kg    Examination:  General exam: Appears calm and comfortable, obese Respiratory system: Clear to auscultation. Respiratory effort normal. Cardiovascular system: S1 & S2 heard, RRR. No JVD, murmurs, rubs, gallops or clicks. No pedal edema.  Irregular rhythm with some mild tachycardia noted. Gastrointestinal system: Abdomen is nondistended, soft and nontender. No organomegaly or masses felt. Normal bowel sounds heard. Central nervous system: Alert and oriented. No focal neurological  deficits. Extremities: Symmetric 5 x 5 power. Skin: No rashes, lesions or ulcers Psychiatry: Judgement and insight appear normal. Mood & affect appropriate.     Data Reviewed: I have personally reviewed following labs and imaging studies  CBC: Recent Labs  Lab 12/17/17 0943 12/18/17 0106  WBC 6.2 7.2  HGB 13.5 12.6  HCT 41.1 39.0  MCV 88.0 89.0  PLT 232 102   Basic Metabolic Panel: Recent Labs  Lab 12/17/17 0943 12/17/17 1323 12/18/17 0106  NA 137  --  136  K 4.0  --  3.8  CL 100  --  100  CO2 27  --  26  GLUCOSE 234*  --  172*  BUN 12  --  12  CREATININE 0.83  --  0.98  CALCIUM 9.5  --  9.1  MG  --  2.0  --    GFR: Estimated Creatinine Clearance: 50.3 mL/min (by C-G formula based on SCr of 0.98 mg/dL). Liver Function Tests: No results for input(s): AST, ALT, ALKPHOS, BILITOT, PROT, ALBUMIN in the last 168 hours. No results for input(s): LIPASE, AMYLASE in the last 168 hours. No results for input(s): AMMONIA in the last 168 hours. Coagulation Profile: No results for input(s): INR, PROTIME in the last 168 hours. Cardiac Enzymes: Recent Labs  Lab 12/17/17 0943 12/17/17 1323 12/17/17 1837 12/18/17 0106  TROPONINI <0.03 <0.03 <0.03 <0.03   BNP (last 3 results) No results for input(s): PROBNP in the last 8760 hours. HbA1C: Recent Labs    12/17/17 1323  HGBA1C 7.5*   CBG: Recent Labs  Lab 12/17/17 1613 12/17/17 2113 12/18/17 0733 12/18/17 1128  GLUCAP 188* 164* 163* 173*   Lipid Profile: Recent Labs    12/17/17 0943  CHOL 198  HDL 40*  LDLCALC 127*  TRIG 154*  CHOLHDL 5.0   Thyroid Function Tests: Recent Labs    12/17/17 0943  TSH 1.422   Anemia Panel: No results for input(s): VITAMINB12, FOLATE, FERRITIN, TIBC, IRON, RETICCTPCT in the last 72 hours. Sepsis Labs: No results for input(s): PROCALCITON, LATICACIDVEN in the last 168 hours.  Recent Results (from the past 240 hour(s))  MRSA PCR Screening     Status: None   Collection  Time: 12/17/17 12:19 PM  Result Value Ref Range Status   MRSA by PCR NEGATIVE NEGATIVE Final    Comment:        The GeneXpert MRSA Assay (FDA approved for NASAL specimens only), is one component of a comprehensive MRSA colonization surveillance program. It is not intended to diagnose MRSA infection nor to guide or monitor treatment for MRSA infections. Performed at [facility], [address], [place], [place] [address]          Radiology Studies: Dg Chest [facility] 1 View  Result Date: 12/17/2017 CLINICAL DATA:  Central chest pain EXAM: PORTABLE CHEST 1 VIEW COMPARISON:  05/12/2016 FINDINGS: Mild peribronchial thickening. Heart and mediastinal contours are within normal limits. No focal opacities or effusions. No acute bony abnormality. IMPRESSION: Mild bronchitic changes. Electronically Signed  By: Rolm Baptise M.D.   On: 12/17/2017 09:59        Scheduled Meds: . apixaban  5 mg Oral BID  . aspirin EC  81 mg Oral QPM  . cholecalciferol  400 Units Oral Daily  . gabapentin  300 mg Oral Daily  . hydrocortisone cream   Topical BID  . insulin aspart  0-15 Units Subcutaneous TID WC  . insulin aspart  0-5 Units Subcutaneous QHS  . latanoprost  1 drop Both Eyes QHS  . magnesium oxide  200 mg Oral Daily  . metoprolol tartrate  75 mg Oral BID  . omega-3 acid ethyl esters  1 g Oral BID  . pantoprazole  40 mg Oral Daily  . sodium chloride flush  3 mL Intravenous Q12H  . timolol  1 drop Both Eyes Daily  . triamterene-hydrochlorothiazide  0.5 tablet Oral Daily  . vitamin B-12   Oral Daily  . vitamin C  500 mg Oral Daily  . zolpidem  5 mg Oral QHS   Continuous Infusions: . sodium chloride    . diltiazem (CARDIZEM) infusion Stopped (12/18/17 1310)     LOS: 1 day    Time spent: 30 minutes    Pratik Darleen Crocker, DO [facility] Pager [phone]  If 7PM-7AM, please contact night-coverage [email or web address] Password [email or web address] 12/18/2017, 1:33 PM

## 2017-12-18 NOTE — Progress Notes (Signed)
*  PRELIMINARY RESULTS* Echocardiogram 2D Echocardiogram has been performed.  [name] 12/18/2017, 2:25 PM

## 2017-12-18 NOTE — Consult Note (Signed)
Cardiology Consultation:   Patient ID: [name] MRN: [id number]; DOB: Mar 14, 1940  Admit date: 12/17/2017 Date of Consult: 12/18/2017  Primary Care Provider: Octavio Graves, DO Primary Cardiologist: Julie Lesches, MD  Primary Electrophysiologist:  None    Patient Profile:   [name] is a 77 y.o. female with a hx of yo female history of CAD with DES to LAD in 2010 and LAD in 2017, HL, HTN, who is being seen today for the evaluation of new onset atrial fibrillation with RVR at the request of Dr [name].   History of Present Illness:   [name] 77 yo female history of CAD with DES to LAD in 2010 and LAD in 2017, HL, HTN, Jehovah's witness, borderline DM2, PSVT admitted with palpitations and chest pain.   K 4, Cr 0.83, BUN 12, WBC 6.2, Hgb 13.5, Plt 232 TSH 1.4 Mg 2  Trop neg x 4 CXR no acute process EKG afib with RVR   Past Medical History:  Diagnosis Date  . Arthritis   . Bulging lumbar disc   . Chronic lower back pain   . Coronary atherosclerosis of native coronary artery    DES LAD 02/2008, DES LAD 02/2015  . Depression   . Dyslipidemia   . Essential hypertension, benign   . Frequent headaches   . Glaucoma   . Palpitations   . PSVT (paroxysmal supraventricular tachycardia) ([place])   . Refusal of blood transfusions as patient is Jehovah's Witness   . Type 2 diabetes mellitus (HCC)    Borderline    Past Surgical History:  Procedure Laterality Date  . CARDIAC CATHETERIZATION N/A 02/23/2015   Procedure: Left Heart Cath and Coronary Angiography;  Surgeon: Sherren Mocha, MD;  Location: [place] [facility];  Service: Cardiovascular;  Laterality: N/A;  . CARDIAC CATHETERIZATION N/A 02/23/2015   Procedure: Coronary Stent Intervention;  Surgeon: Sherren Mocha, MD;  Location: [place] [facility];  Service: Cardiovascular;  Laterality: N/A;  . CARDIAC CATHETERIZATION N/A 01/04/2016   Procedure: Left Heart Cath and Coronary Angiography;  Surgeon: Peter M Martinique, MD;   Location: [place] [facility];  Service: Cardiovascular;  Laterality: N/A;  . CORONARY ANGIOPLASTY  02/23/2015  . CORONARY ANGIOPLASTY WITH STENT PLACEMENT  2009  . DILATION AND CURETTAGE OF UTERUS    . TUBAL LIGATION         Inpatient Medications: Scheduled Meds: . apixaban  5 mg Oral BID  . aspirin EC  81 mg Oral QPM  . cholecalciferol  400 Units Oral Daily  . gabapentin  300 mg Oral Daily  . hydrocortisone cream   Topical BID  . insulin aspart  0-15 Units Subcutaneous TID WC  . insulin aspart  0-5 Units Subcutaneous QHS  . latanoprost  1 drop Both Eyes QHS  . magnesium oxide  200 mg Oral Daily  . metoprolol tartrate  50 mg Oral BID  . omega-3 acid ethyl esters  1 g Oral BID  . pantoprazole  40 mg Oral Daily  . sodium chloride flush  3 mL Intravenous Q12H  . timolol  1 drop Both Eyes Daily  . triamterene-hydrochlorothiazide  0.5 tablet Oral Daily  . vitamin B-12   Oral Daily  . vitamin C  500 mg Oral Daily  . zolpidem  5 mg Oral QHS   Continuous Infusions: . sodium chloride    . diltiazem (CARDIZEM) infusion 5 mg/hr (12/18/17 0752)   PRN Meds: sodium chloride, acetaminophen **OR** acetaminophen, HYDROcodone-acetaminophen, MUSCLE RUB, nitroGLYCERIN, ondansetron **OR**  ondansetron (ZOFRAN) IV, sodium chloride flush  Allergies:   No Known Allergies  Social History:   Social History   Socioeconomic History  . Marital status: Widowed    Spouse name: Not on file  . Number of children: Not on file  . Years of education: Not on file  . Highest education level: Not on file  Occupational History  . Occupation: RETIRED  Social Needs  . Financial resource strain: Not on file  . Food insecurity:    Worry: Not on file    Inability: Not on file  . Transportation needs:    Medical: Not on file    Non-medical: Not on file  Tobacco Use  . Smoking status: Never Smoker  . Smokeless tobacco: Never Used  Substance and Sexual Activity  . Alcohol use: No    Alcohol/week: 0.0  standard drinks  . Drug use: No  . Sexual activity: Never  Lifestyle  . Physical activity:    Days per week: Not on file    Minutes per session: Not on file  . Stress: Not on file  Relationships  . Social connections:    Talks on phone: Not on file    Gets together: Not on file    Attends religious service: Not on file    Active member of club or organization: Not on file    Attends meetings of clubs or organizations: Not on file    Relationship status: Not on file  . Intimate partner violence:    Fear of current or ex partner: Not on file    Emotionally abused: Not on file    Physically abused: Not on file    Forced sexual activity: Not on file  Other Topics Concern  . Not on file  Social History Narrative  . Not on file    Family History:    Family History  Problem Relation Age of Onset  . Heart attack Mother   . Cancer Father      ROS:  Please see the history of present illness.   All other ROS reviewed and negative.     Physical Exam/Data:   Vitals:   12/18/17 0600 12/18/17 0745 12/18/17 0800 12/18/17 0944  BP: 112/71  96/68 (!) 115/56  Pulse: 60  (!) 57 93  Resp: 14  (!) 21   Temp:  (!) 97.4 F (36.3 C)    TempSrc:  Oral    SpO2: 100%  97%   Weight:      Height:        Intake/Output Summary (Last 24 hours) at 12/18/2017 1044 Last data filed at 12/18/2017 0949 Gross per 24 hour  Intake 6 ml  Output -  Net 6 ml   Filed Weights   12/17/17 0922 12/17/17 1217  Weight: 83 kg 83.7 kg   Body mass index is 31.67 kg/m.  General:  Well nourished, well developed, in no acute distress HEENT: normal Lymph: no adenopathy Neck: no JVD Endocrine:  No thryomegaly Vascular: No carotid bruits; FA pulses 2+ bilaterally without bruits  Cardiac:  Irreg, no m/r/g Lungs:  clear to auscultation bilaterally, no wheezing, rhonchi or rales  Abd: soft, nontender, no hepatomegaly  Ext: no edema Musculoskeletal:  No deformities, BUE and BLE strength normal and  equal Skin: warm and dry  Neuro:  CNs 2-12 intact, no focal abnormalities noted Psych:  Normal affect    Laboratory Data:  Chemistry Recent Labs  Lab 12/17/17 0943 12/18/17 0106  NA 137 136  K 4.0 3.8  CL 100 100  CO2 27 26  GLUCOSE 234* 172*  BUN 12 12  CREATININE 0.83 0.98  CALCIUM 9.5 9.1  GFRNONAA >60 54*  GFRAA >60 >60  ANIONGAP 10 10    No results for input(s): PROT, ALBUMIN, AST, ALT, ALKPHOS, BILITOT in the last 168 hours. Hematology Recent Labs  Lab 12/17/17 0943 12/18/17 0106  WBC 6.2 7.2  RBC 4.67 4.38  HGB 13.5 12.6  HCT 41.1 39.0  MCV 88.0 89.0  MCH 28.9 28.8  MCHC 32.8 32.3  RDW 12.2 12.2  PLT 232 226   Cardiac Enzymes Recent Labs  Lab 12/17/17 0943 12/17/17 1323 12/17/17 1837 12/18/17 0106  TROPONINI <0.03 <0.03 <0.03 <0.03   No results for input(s): TROPIPOC in the last 168 hours.  BNPNo results for input(s): BNP, PROBNP in the last 168 hours.  DDimer No results for input(s): DDIMER in the last 168 hours.  Radiology/Studies:  Dg Chest Port 1 View  Result Date: 12/17/2017 CLINICAL DATA:  Central chest pain EXAM: PORTABLE CHEST 1 VIEW COMPARISON:  05/12/2016 FINDINGS: Mild peribronchial thickening. Heart and mediastinal contours are within normal limits. No focal opacities or effusions. No acute bony abnormality. IMPRESSION: Mild bronchitic changes. Electronically Signed   By: Rolm Baptise M.D.   On: 12/17/2017 09:59    Assessment and Plan:   1. Afib with RVR - new diagnosis this admission - started on dilt gtt, we will increase her oral lopressor and wean dilt gtt - CHADS2VASC score is (HTN, age x2, DM2, gender, CAD) 47, started on eliquis 5mg  bid (started 12/17/17)which is appropriate dosing - repeat echo once rates better controlled - can consider DCCV after 3 weeks of anticoag.   - rates 80s on dilt gtt at 5, increase lopressor to 75mg  bid and wean drip, room to titrate lopressor further if needed. May end up needing lopressor  100mg  bid pending rates and bp's.   2. CAD - admitted with chest pain and palpitations. Symptoms appear to be solely due to her afib with RVR, no evidence of ischemia by EKG or enzymes.      For questions or updates, please contact [place] Please consult [email or web address] for contact info under     Signed, Carlyle Dolly, MD  12/18/2017 10:44 AM

## 2017-12-19 LAB — GLUCOSE, CAPILLARY
GLUCOSE-CAPILLARY: 148 mg/dL — AB (ref 70–99)
Glucose-Capillary: 188 mg/dL — ABNORMAL HIGH (ref 70–99)
Glucose-Capillary: 174 mg/dL — ABNORMAL HIGH (ref 70–99)

## 2017-12-19 MED ORDER — INFLUENZA VAC SPLIT HIGH-DOSE 0.5 ML IM SUSY
0.5000 mL | PREFILLED_SYRINGE | INTRAMUSCULAR | Status: DC
  Filled 2017-12-19: qty 0.5

## 2017-12-19 MED ORDER — METOPROLOL TARTRATE 50 MG PO TABS
100.0000 mg | ORAL_TABLET | Freq: Two times a day (BID) | ORAL | Status: DC
  Administered 2017-12-19 – 2017-12-20 (×4): 100 mg via ORAL
  Filled 2017-12-19 (×4): qty 2

## 2017-12-19 NOTE — Progress Notes (Signed)
Progress Note  Patient Name: [name] Date of Encounter: 12/19/2017  Primary Cardiologist: Rozann Lesches, MD   Subjective   No complaints  Inpatient Medications    Scheduled Meds: . apixaban  5 mg Oral BID  . aspirin EC  81 mg Oral QPM  . cholecalciferol  400 Units Oral Daily  . gabapentin  300 mg Oral Daily  . hydrocortisone cream   Topical BID  . insulin aspart  0-15 Units Subcutaneous TID WC  . insulin aspart  0-5 Units Subcutaneous QHS  . latanoprost  1 drop Both Eyes QHS  . magnesium oxide  200 mg Oral Daily  . metoprolol tartrate  75 mg Oral BID  . omega-3 acid ethyl esters  1 g Oral BID  . pantoprazole  40 mg Oral Daily  . sodium chloride flush  3 mL Intravenous Q12H  . timolol  1 drop Both Eyes Daily  . triamterene-hydrochlorothiazide  0.5 tablet Oral Daily  . vitamin B-12   Oral Daily  . vitamin C  500 mg Oral Daily  . zolpidem  5 mg Oral QHS   Continuous Infusions: . sodium chloride    . diltiazem (CARDIZEM) infusion Stopped (12/18/17 1310)   PRN Meds: sodium chloride, acetaminophen **OR** acetaminophen, HYDROcodone-acetaminophen, MUSCLE RUB, nitroGLYCERIN, ondansetron **OR** ondansetron (ZOFRAN) IV, sodium chloride flush   Vital Signs    Vitals:   12/19/17 0000 12/19/17 0015 12/19/17 0404 12/19/17 0722  BP: 125/73     Pulse: 66     Resp: 15     Temp:  97.8 F (36.6 C) 97.8 F (36.6 C) 97.8 F (36.6 C)  TempSrc:  Oral Oral Oral  SpO2: 100%     Weight:   84.1 kg   Height:        Intake/Output Summary (Last 24 hours) at 12/19/2017 0805 Last data filed at 12/18/2017 1835 Gross per 24 hour  Intake 628.67 ml  Output -  Net 628.67 ml   Filed Weights   12/17/17 0922 12/17/17 1217 12/19/17 0404  Weight: 83 kg 83.7 kg 84.1 kg    Telemetry    afib 90s-130s - Personally Reviewed  ECG    na  Physical Exam   GEN: No acute distress.   Neck: No JVD Cardiac: irreg, no murmurs, rubs, or gallops.  Respiratory: Clear to auscultation  bilaterally. GI: Soft, nontender, non-distended  MS: No edema; No deformity. Neuro:  Nonfocal  Psych: Normal affect   Labs    Chemistry Recent Labs  Lab 12/17/17 0943 12/18/17 0106  NA 137 136  K 4.0 3.8  CL 100 100  CO2 27 26  GLUCOSE 234* 172*  BUN 12 12  CREATININE 0.83 0.98  CALCIUM 9.5 9.1  GFRNONAA >60 54*  GFRAA >60 >60  ANIONGAP 10 10     Hematology Recent Labs  Lab 12/17/17 0943 12/18/17 0106  WBC 6.2 7.2  RBC 4.67 4.38  HGB 13.5 12.6  HCT 41.1 39.0  MCV 88.0 89.0  MCH 28.9 28.8  MCHC 32.8 32.3  RDW 12.2 12.2  PLT 232 226    Cardiac Enzymes Recent Labs  Lab 12/17/17 0943 12/17/17 1323 12/17/17 1837 12/18/17 0106  TROPONINI <0.03 <0.03 <0.03 <0.03   No results for input(s): TROPIPOC in the last 168 hours.   BNPNo results for input(s): BNP, PROBNP in the last 168 hours.   DDimer No results for input(s): DDIMER in the last 168 hours.   Radiology    Dg Chest [facility]  Result Date: 12/17/2017 CLINICAL DATA:  Central chest pain EXAM: PORTABLE CHEST 1 VIEW COMPARISON:  05/12/2016 FINDINGS: Mild peribronchial thickening. Heart and mediastinal contours are within normal limits. No focal opacities or effusions. No acute bony abnormality. IMPRESSION: Mild bronchitic changes. Electronically Signed   By: Rolm Baptise M.D.   On: 12/17/2017 09:59    Cardiac Studies     Patient Profile     JACLIN FINKS is a 77 y.o. female with a hx of yo female history of CAD with DES to LAD in 2010 and LAD in 2017, HL, HTN, who is being seen today for the evaluation of new onset atrial fibrillation with RVR at the request of Dr Manuella Ghazi.    Assessment & Plan    1. Afib with RVR - rates up and down yesterday, was able to come off dilt gtt - we will increase her lopressor to 100mg  bid, follow rates and bp's today. Stop her maxide to allow more room for beta blocker dosing.  - continue eliquis for stroke prevention, CHADS2Vasc score is 6 - echo LVE 65-70%, mild  LAE - - can consider DCCV after 3 weeks of anticoag as outpatient   2. CAD - admitted with chest pain and palpitations. Symptoms appear to be solely due to her afib with RVR, no evidence of ischemia by EKG or enzymes.    Follow heart rates and bp's into the afternoon with increased lopressor dose this AM, if rates <110 and stable bp's would be ok for discharge. If remains admitted would be ok to transfer to telemetry.   For questions or updates, please contact [place] Please consult [email or web address] for contact info under        Signed, Carlyle Dolly, MD  12/19/2017, 8:05 AM

## 2017-12-19 NOTE — Progress Notes (Signed)
PROGRESS NOTE    [name]  VOH:[id number] DOB: Feb 20, 1941 DOA: 12/17/2017 PCP: Octavio Graves, DO   Brief Narrative:  Per HPI: [name] a 77 y.o.femalewith medical history significant forCAD and prior stents, palpitations and paroxysmal SVT, hypertension, and dyslipidemia who presented to the ED with intermittent left shoulder pain as she was trying to fall asleep last night at approximately 0100. She states that this progressed to some midsternal chest tightness and she took one sublingual nitroglycerin with partial relief and then took a second 1 approximately 1 hour later and try to lay down, but continued to experience multiple symptoms to include some palpitations as well as diaphoresis. She denied any nausea, vomiting, or shortness of breath. No cough, fevers, or chills noted. She states that she had a similar episode over a week ago that spontaneously resolved. She states that she has been taking her medications as otherwise prescribed except for her morning doses.  She was admitted with atrial fibrillation with RVR and initially received a bolus dose of Cardizem with no significant improvement and had been placed on a drip which had been weaned off.  She is now being seen by cardiology with increasing doses of metoprolol with still poor control of her heart rate.  She is stable to transfer to telemetry.  She is no longer symptomatic.   Assessment & Plan:   Principal Problem:   Atrial fibrillation with RVR (HCC) Active Problems:   Mixed hyperlipidemia   Essential hypertension   Coronary atherosclerosis of native coronary artery   Chest pain   1. New onset atrial fibrillation with RVR- persistent.   TSH within normal limits and 2D echo with LVEF 65 to 69%, but diastolic function could not be assessed on account of her heart rhythm.  Appreciate further cardiology recommendations with increasing doses of metoprolol now up to 100 mg twice daily.  Stable for  transfer to telemetry per cardiology as patient is asymptomatic.  Continue Eliquis for anticoagulation. 2. Chest pain and palpitations-resolved. Likely related to above. She does have history of CAD and it appears that this has resolved.  Enzymes are within normal limits. 3. Essential hypertension. Continue metoprolol with increased doses for heart rate control and Maxide has been discontinued. Monitor carefully. 4. History of dyslipidemia. Does not appear to be on statin and will check lipid profile. 5. Hyperglycemia with history of borderline type 2 diabetes.   Hemoglobin A1c at 7.5%.  Will likely need metformin on discharge and further follow-up outpatient.  Maintain on SSI and carb modified diet.  Blood glucose control currently improved.   DVT prophylaxis: Eliquis Code Status: Full Family Communication: Daughter at bedside Disposition Plan: Per cardiology with weaning of Cardizem drip   Consultants:   Cardiology-Dr. Harl Bowie  Procedures:   None  Antimicrobials:   None  Subjective: Patient seen and evaluated today with no new acute complaints or concerns. No acute concerns or events noted overnight.  Objective: Vitals:   12/19/17 1005 12/19/17 1123 12/19/17 1152 12/19/17 1200  BP:    (!) 115/59  Pulse:   (!) 48 (!) 138  Resp:   (!) 23 20  Temp:  97.8 F (36.6 C)    TempSrc:  Oral    SpO2: 99%  98% 98%  Weight:      Height:        Intake/Output Summary (Last 24 hours) at 12/19/2017 1314 Last data filed at 12/19/2017 1100 Gross per 24 hour  Intake 510 ml  Output -  Net 510 ml   Filed Weights   12/17/17 0922 12/17/17 1217 12/19/17 0404  Weight: 83 kg 83.7 kg 84.1 kg    Examination:  General exam: Appears calm and comfortable  Respiratory system: Clear to auscultation. Respiratory effort normal. Cardiovascular system: S1 & S2 heard, tachyarrhythmia, irregular. No JVD, murmurs, rubs, gallops or clicks. No pedal edema. Gastrointestinal system:  Abdomen is nondistended, soft and nontender. No organomegaly or masses felt. Normal bowel sounds heard. Central nervous system: Alert and oriented. No focal neurological deficits. Extremities: Symmetric 5 x 5 power. Skin: No rashes, lesions or ulcers Psychiatry: Judgement and insight appear normal. Mood & affect appropriate.     Data Reviewed: I have personally reviewed following labs and imaging studies  CBC: Recent Labs  Lab 12/17/17 0943 12/18/17 0106  WBC 6.2 7.2  HGB 13.5 12.6  HCT 41.1 39.0  MCV 88.0 89.0  PLT 232 099   Basic Metabolic Panel: Recent Labs  Lab 12/17/17 0943 12/17/17 1323 12/18/17 0106  NA 137  --  136  K 4.0  --  3.8  CL 100  --  100  CO2 27  --  26  GLUCOSE 234*  --  172*  BUN 12  --  12  CREATININE 0.83  --  0.98  CALCIUM 9.5  --  9.1  MG  --  2.0  --    GFR: Estimated Creatinine Clearance: 50.5 mL/min (by C-G formula based on SCr of 0.98 mg/dL). Liver Function Tests: No results for input(s): AST, ALT, ALKPHOS, BILITOT, PROT, ALBUMIN in the last 168 hours. No results for input(s): LIPASE, AMYLASE in the last 168 hours. No results for input(s): AMMONIA in the last 168 hours. Coagulation Profile: No results for input(s): INR, PROTIME in the last 168 hours. Cardiac Enzymes: Recent Labs  Lab 12/17/17 0943 12/17/17 1323 12/17/17 1837 12/18/17 0106  TROPONINI <0.03 <0.03 <0.03 <0.03   BNP (last 3 results) No results for input(s): PROBNP in the last 8760 hours. HbA1C: Recent Labs    12/17/17 1323  HGBA1C 7.5*   CBG: Recent Labs  Lab 12/18/17 1128 12/18/17 1632 12/18/17 2112 12/19/17 0721 12/19/17 1122  GLUCAP 173* 126* 166* 188* 174*   Lipid Profile: Recent Labs    12/17/17 0943  CHOL 198  HDL 40*  LDLCALC 127*  TRIG 154*  CHOLHDL 5.0   Thyroid Function Tests: Recent Labs    12/17/17 0943  TSH 1.422   Anemia Panel: No results for input(s): VITAMINB12, FOLATE, FERRITIN, TIBC, IRON, RETICCTPCT in the last 72  hours. Sepsis Labs: No results for input(s): PROCALCITON, LATICACIDVEN in the last 168 hours.  Recent Results (from the past 240 hour(s))  MRSA PCR Screening     Status: None   Collection Time: 12/17/17 12:19 PM  Result Value Ref Range Status   MRSA by PCR NEGATIVE NEGATIVE Final    Comment:        The GeneXpert MRSA Assay (FDA approved for NASAL specimens only), is one component of a comprehensive MRSA colonization surveillance program. It is not intended to diagnose MRSA infection nor to guide or monitor treatment for MRSA infections. Performed at [facility], [address] [place], [place] [address]          Radiology Studies: No results found.      Scheduled Meds: . apixaban  5 mg Oral BID  . cholecalciferol  400 Units Oral Daily  . gabapentin  300 mg Oral Daily  . hydrocortisone cream   Topical BID  .  insulin aspart  0-15 Units Subcutaneous TID WC  . insulin aspart  0-5 Units Subcutaneous QHS  . latanoprost  1 drop Both Eyes QHS  . magnesium oxide  200 mg Oral Daily  . metoprolol tartrate  100 mg Oral BID  . omega-3 acid ethyl esters  1 g Oral BID  . pantoprazole  40 mg Oral Daily  . sodium chloride flush  3 mL Intravenous Q12H  . timolol  1 drop Both Eyes Daily  . vitamin B-12   Oral Daily  . vitamin C  500 mg Oral Daily  . zolpidem  5 mg Oral QHS   Continuous Infusions: . sodium chloride       LOS: 2 days    Time spent: 30 minutes    Laquitha Heslin Darleen Crocker, DO [facility] Pager [phone]  If 7PM-7AM, please contact night-coverage [email or web address] Password [email or web address] 12/19/2017, 1:14 PM

## 2017-12-20 LAB — BASIC METABOLIC PANEL
ANION GAP: 8 (ref 5–15)
BUN: 18 mg/dL (ref 8–23)
CALCIUM: 9.2 mg/dL (ref 8.9–10.3)
CO2: 29 mmol/L (ref 22–32)
CREATININE: 0.89 mg/dL (ref 0.44–1.00)
Chloride: 102 mmol/L (ref 98–111)
Glucose, Bld: 163 mg/dL — ABNORMAL HIGH (ref 70–99)
Potassium: 3.7 mmol/L (ref 3.5–5.1)
SODIUM: 139 mmol/L (ref 135–145)

## 2017-12-20 LAB — CBC
HCT: 37.7 % (ref 36.0–46.0)
Hemoglobin: 12.3 g/dL (ref 12.0–15.0)
MCH: 29.1 pg (ref 26.0–34.0)
MCHC: 32.6 g/dL (ref 30.0–36.0)
MCV: 89.1 fL (ref 80.0–100.0)
NRBC: 0 % (ref 0.0–0.2)
Platelets: 237 10*3/uL (ref 150–400)
RBC: 4.23 MIL/uL (ref 3.87–5.11)
RDW: 12.2 % (ref 11.5–15.5)
WBC: 7.7 10*3/uL (ref 4.0–10.5)

## 2017-12-20 LAB — GLUCOSE, CAPILLARY
GLUCOSE-CAPILLARY: 123 mg/dL — AB (ref 70–99)
GLUCOSE-CAPILLARY: 120 mg/dL — AB (ref 70–99)
GLUCOSE-CAPILLARY: 186 mg/dL — AB (ref 70–99)
Glucose-Capillary: 154 mg/dL — ABNORMAL HIGH (ref 70–99)
Glucose-Capillary: 167 mg/dL — ABNORMAL HIGH (ref 70–99)

## 2017-12-20 MED ORDER — DILTIAZEM HCL ER COATED BEADS 120 MG PO CP24
120.0000 mg | ORAL_CAPSULE | Freq: Every day | ORAL | Status: DC
  Administered 2017-12-20: 120 mg via ORAL
  Filled 2017-12-20: qty 1

## 2017-12-20 NOTE — Progress Notes (Signed)
PROGRESS NOTE    [name]  ALP:[id number] DOB: 1940-09-28 DOA: 12/17/2017 PCP: Julie Graves, DO   Brief Narrative:   Per HPI: [name] a 77 y.o.femalewith medical history significant forCAD and prior stents, palpitations and paroxysmal SVT, hypertension, and dyslipidemia who presented to the ED with intermittent left shoulder pain as she was trying to fall asleep last night at approximately 0100. She states that this progressed to some midsternal chest tightness and she took one sublingual nitroglycerin with partial relief and then took a second 1 approximately 1 hour later and try to lay down, but continued to experience multiple symptoms to include some palpitations as well as diaphoresis. She denied any nausea, vomiting, or shortness of breath. No cough, fevers, or chills noted. She states that she had a similar episode over a week ago that spontaneously resolved. She states that she has been taking her medications as otherwise prescribed except for her morning doses.  She was admitted with atrial fibrillation with RVR and initially received a bolus dose of Cardizem with no significant improvement and had been placed on a drip which had been weaned off.  She is now being seen by cardiology with increasing doses of metoprolol with still poor control of her heart rate.  Cardizem has been added as a result today. She is stable to transfer to telemetry with orders to do so yesterday.   Assessment & Plan:  Principal Problem: Atrial fibrillation with RVR (HCC) Active Problems: Mixed hyperlipidemia Essential hypertension Coronary atherosclerosis of native coronary artery Chest pain   1. New onset atrial fibrillation with RVR- persistent.TSH within normal limits and 2D echo with LVEF 65 to 35%, but diastolic function could not be assessed on account of her heart rhythm.  Appreciate further cardiology recommendations with increasing doses of  metoprolol now up to 100 mg twice daily.  Stable for transfer to telemetry per cardiology as patient is asymptomatic.    Cardizem 120 mg daily added per cardiology today to help with better heart rate control.  Continue Eliquis for anticoagulation. 2. Chest pain and palpitations- intermittent. Likely related to above. She does have history of CADand it appears that this has resolved. Enzymes are within normal limits. 3. Essential hypertension. Continue metoprolol with increased doses for heart rate control and Maxide has been discontinued. Monitor carefully. 4. History of dyslipidemia. Does not appear to be on statin and will check lipid profile. 5. Hyperglycemia with history of borderline type 2 diabetes.Hemoglobin A1c at 7.5%. Will likely need metformin on discharge and further follow-up outpatient. Maintain on SSI and carb modified diet. Blood glucose control currently improved.   DVT prophylaxis:Eliquis Code Status:Full Family Communication:Daughter at bedside Disposition Plan:Per cardiology increase in Lopressor dose yesterday in addition of diltiazem today 10/31   Consultants:  Cardiology-Dr. Harl Bowie  Procedures:  None  Antimicrobials:   None  Subjective: Patient seen and evaluated today with no new acute complaints or concerns this morning, but she was noted to have some palpitations overnight.  Her heart rate still remains elevated.  Objective: Vitals:   12/20/17 0418 12/20/17 0500 12/20/17 0600 12/20/17 0900  BP:  (!) 103/47 105/64   Pulse:  (!) 45 (!) 58   Resp:  (!) 21 15   Temp: 97.6 F (36.4 C)   98.3 F (36.8 C)  TempSrc: Oral   Oral  SpO2:  97% 98%   Weight: 83.7 kg     Height:        Intake/Output Summary (Last 24 hours)  at 12/20/2017 0958 Last data filed at 12/20/2017 0916 Gross per 24 hour  Intake 1080 ml  Output -  Net 1080 ml   Filed Weights   12/17/17 1217 12/19/17 0404 12/20/17 0418  Weight: 83.7 kg 84.1 kg 83.7 kg     Examination:  General exam: Appears calm and comfortable  Respiratory system: Clear to auscultation. Respiratory effort normal. Cardiovascular system: S1 & S2 heard, irregular and tachycardic. No JVD, murmurs, rubs, gallops or clicks. No pedal edema. Gastrointestinal system: Abdomen is nondistended, soft and nontender. No organomegaly or masses felt. Normal bowel sounds heard. Central nervous system: Alert and oriented. No focal neurological deficits. Extremities: Symmetric 5 x 5 power. Skin: No rashes, lesions or ulcers Psychiatry: Judgement and insight appear normal. Mood & affect appropriate.     Data Reviewed: I have personally reviewed following labs and imaging studies  CBC: Recent Labs  Lab 12/17/17 0943 12/18/17 0106 12/20/17 0347  WBC 6.2 7.2 7.7  HGB 13.5 12.6 12.3  HCT 41.1 39.0 37.7  MCV 88.0 89.0 89.1  PLT 232 226 706   Basic Metabolic Panel: Recent Labs  Lab 12/17/17 0943 12/17/17 1323 12/18/17 0106 12/20/17 0347  NA 137  --  136 139  K 4.0  --  3.8 3.7  CL 100  --  100 102  CO2 27  --  26 29  GLUCOSE 234*  --  172* 163*  BUN 12  --  12 18  CREATININE 0.83  --  0.98 0.89  CALCIUM 9.5  --  9.1 9.2  MG  --  2.0  --   --    GFR: Estimated Creatinine Clearance: 55.4 mL/min (by C-G formula based on SCr of 0.89 mg/dL). Liver Function Tests: No results for input(s): AST, ALT, ALKPHOS, BILITOT, PROT, ALBUMIN in the last 168 hours. No results for input(s): LIPASE, AMYLASE in the last 168 hours. No results for input(s): AMMONIA in the last 168 hours. Coagulation Profile: No results for input(s): INR, PROTIME in the last 168 hours. Cardiac Enzymes: Recent Labs  Lab 12/17/17 0943 12/17/17 1323 12/17/17 1837 12/18/17 0106  TROPONINI <0.03 <0.03 <0.03 <0.03   BNP (last 3 results) No results for input(s): PROBNP in the last 8760 hours. HbA1C: Recent Labs    12/17/17 1323  HGBA1C 7.5*   CBG: Recent Labs  Lab 12/18/17 1632 12/18/17 2112  12/19/17 0721 12/19/17 1122 12/19/17 1609  GLUCAP 126* 166* 188* 174* 148*   Lipid Profile: No results for input(s): CHOL, HDL, LDLCALC, TRIG, CHOLHDL, LDLDIRECT in the last 72 hours. Thyroid Function Tests: No results for input(s): TSH, T4TOTAL, FREET4, T3FREE, THYROIDAB in the last 72 hours. Anemia Panel: No results for input(s): VITAMINB12, FOLATE, FERRITIN, TIBC, IRON, RETICCTPCT in the last 72 hours. Sepsis Labs: No results for input(s): PROCALCITON, LATICACIDVEN in the last 168 hours.  Recent Results (from the past 240 hour(s))  MRSA PCR Screening     Status: None   Collection Time: 12/17/17 12:19 PM  Result Value Ref Range Status   MRSA by PCR NEGATIVE NEGATIVE Final    Comment:        The GeneXpert MRSA Assay (FDA approved for NASAL specimens only), is one component of a comprehensive MRSA colonization surveillance program. It is not intended to diagnose MRSA infection nor to guide or monitor treatment for MRSA infections. Performed at [facility], [address], [place],  [address]          Radiology Studies: No results found.  Scheduled Meds: . apixaban  5 mg Oral BID  . cholecalciferol  400 Units Oral Daily  . diltiazem  120 mg Oral Daily  . gabapentin  300 mg Oral Daily  . hydrocortisone cream   Topical BID  . Influenza vac split quadrivalent PF  0.5 mL Intramuscular Tomorrow-1000  . insulin aspart  0-15 Units Subcutaneous TID WC  . insulin aspart  0-5 Units Subcutaneous QHS  . latanoprost  1 drop Both Eyes QHS  . magnesium oxide  200 mg Oral Daily  . metoprolol tartrate  100 mg Oral BID  . omega-3 acid ethyl esters  1 g Oral BID  . pantoprazole  40 mg Oral Daily  . sodium chloride flush  3 mL Intravenous Q12H  . timolol  1 drop Both Eyes Daily  . vitamin B-12   Oral Daily  . vitamin C  500 mg Oral Daily  . zolpidem  5 mg Oral QHS   Continuous Infusions: . sodium chloride       LOS: 3 days    Time spent: 30  minutes    Willard Madrigal Darleen Crocker, DO [facility] Pager [phone]  If 7PM-7AM, please contact night-coverage [email or web address] Password [email or web address] 12/20/2017, 9:58 AM

## 2017-12-20 NOTE — Progress Notes (Signed)
Progress Note  Patient Name: [name] Date of Encounter: 12/20/2017  Primary Cardiologist: Rozann Lesches, MD   Subjective   Some palpitations overnight.   Inpatient Medications    Scheduled Meds: . apixaban  5 mg Oral BID  . cholecalciferol  400 Units Oral Daily  . gabapentin  300 mg Oral Daily  . hydrocortisone cream   Topical BID  . Influenza vac split quadrivalent PF  0.5 mL Intramuscular Tomorrow-1000  . insulin aspart  0-15 Units Subcutaneous TID WC  . insulin aspart  0-5 Units Subcutaneous QHS  . latanoprost  1 drop Both Eyes QHS  . magnesium oxide  200 mg Oral Daily  . metoprolol tartrate  100 mg Oral BID  . omega-3 acid ethyl esters  1 g Oral BID  . pantoprazole  40 mg Oral Daily  . sodium chloride flush  3 mL Intravenous Q12H  . timolol  1 drop Both Eyes Daily  . vitamin B-12   Oral Daily  . vitamin C  500 mg Oral Daily  . zolpidem  5 mg Oral QHS   Continuous Infusions: . sodium chloride     PRN Meds: sodium chloride, acetaminophen **OR** acetaminophen, HYDROcodone-acetaminophen, MUSCLE RUB, nitroGLYCERIN, ondansetron **OR** ondansetron (ZOFRAN) IV, sodium chloride flush   Vital Signs    Vitals:   12/20/17 0418 12/20/17 0500 12/20/17 0600 12/20/17 0900  BP:  (!) 103/47 105/64   Pulse:  (!) 45 (!) 58   Resp:  (!) 21 15   Temp: 97.6 F (36.4 C)   98.3 F (36.8 C)  TempSrc: Oral   Oral  SpO2:  97% 98%   Weight: 83.7 kg     Height:        Intake/Output Summary (Last 24 hours) at 12/20/2017 0906 Last data filed at 12/20/2017 0000 Gross per 24 hour  Intake 1110 ml  Output -  Net 1110 ml   Filed Weights   12/17/17 1217 12/19/17 0404 12/20/17 0418  Weight: 83.7 kg 84.1 kg 83.7 kg    Telemetry    afib variable rates 80s to 130s - Personally Reviewed  ECG    na  Physical Exam   GEN: No acute distress.   Neck: No JVD Cardiac: irreg, no murmurs, rubs, or gallops.  Respiratory: Clear to auscultation bilaterally. GI: Soft,  nontender, non-distended  MS: No edema; No deformity. Neuro:  Nonfocal  Psych: Normal affect   Labs    Chemistry Recent Labs  Lab 12/17/17 0943 12/18/17 0106 12/20/17 0347  NA 137 136 139  K 4.0 3.8 3.7  CL 100 100 102  CO2 27 26 29   GLUCOSE 234* 172* 163*  BUN 12 12 18   CREATININE 0.83 0.98 0.89  CALCIUM 9.5 9.1 9.2  GFRNONAA >60 54* >60  GFRAA >60 >60 >60  ANIONGAP 10 10 8      Hematology Recent Labs  Lab 12/17/17 0943 12/18/17 0106 12/20/17 0347  WBC 6.2 7.2 7.7  RBC 4.67 4.38 4.23  HGB 13.5 12.6 12.3  HCT 41.1 39.0 37.7  MCV 88.0 89.0 89.1  MCH 28.9 28.8 29.1  MCHC 32.8 32.3 32.6  RDW 12.2 12.2 12.2  PLT 232 226 237    Cardiac Enzymes Recent Labs  Lab 12/17/17 0943 12/17/17 1323 12/17/17 1837 12/18/17 0106  TROPONINI <0.03 <0.03 <0.03 <0.03   No results for input(s): TROPIPOC in the last 168 hours.   BNPNo results for input(s): BNP, PROBNP in the last 168 hours.   DDimer No results for  input(s): DDIMER in the last 168 hours.   Radiology    No results found.  Cardiac Studies     Patient Profile     [name] a 77 y.o.femalewith a hx of yo female history of CAD with DES to LAD in 2010 and LAD in 2017, HL, HTN,who is being seen today for the evaluation of new onset atrial fibrillation with RVRat the request ofDr Manuella Ghazi.  Assessment & Plan    1. Afib with RVR - we  increased her lopressor to 100mg  bid yesterday, stopped maxide to allow room with her bp for beta blocker  - continue eliquis for stroke prevention, CHADS2Vasc score is 6 - echo LVE 65-70%, mild LAE - - can consider DCCV after 3 weeks of anticoag as outpatient  - add dilt 120mg  daily this AM and follow rates. Hopefully will be sufficent, room to titrate dilt further if needed. Goal rates would be <110 for discharge.    2. CAD - admitted with chest pain and palpitations. Symptoms appear to be solely due to her afib with RVR, no evidence of ischemia by EKG or  enzymes.    For questions or updates, please contact [address] Please consult [email or web address] for contact info under        Signed, Carlyle Dolly, MD  12/20/2017, 9:06 AM

## 2017-12-21 DIAGNOSIS — I4891 Unspecified atrial fibrillation: Secondary | ICD-10-CM

## 2017-12-21 DIAGNOSIS — E782 Mixed hyperlipidemia: Secondary | ICD-10-CM

## 2017-12-21 DIAGNOSIS — E1169 Type 2 diabetes mellitus with other specified complication: Secondary | ICD-10-CM

## 2017-12-21 DIAGNOSIS — I10 Essential (primary) hypertension: Secondary | ICD-10-CM

## 2017-12-21 DIAGNOSIS — R079 Chest pain, unspecified: Secondary | ICD-10-CM

## 2017-12-21 DIAGNOSIS — I2510 Atherosclerotic heart disease of native coronary artery without angina pectoris: Secondary | ICD-10-CM

## 2017-12-21 DIAGNOSIS — E119 Type 2 diabetes mellitus without complications: Secondary | ICD-10-CM

## 2017-12-21 LAB — GLUCOSE, CAPILLARY
Glucose-Capillary: 152 mg/dL — ABNORMAL HIGH (ref 70–99)
Glucose-Capillary: 158 mg/dL — ABNORMAL HIGH (ref 70–99)

## 2017-12-21 MED ORDER — METOPROLOL TARTRATE 50 MG PO TABS: 100.0000 mg | ORAL_TABLET | Freq: Two times a day (BID) | ORAL | 3 refills | Status: DC

## 2017-12-21 MED ORDER — BLOOD GLUCOSE METER KIT: PACK | 0 refills | Status: DC

## 2017-12-21 MED ORDER — METOPROLOL TARTRATE 50 MG PO TABS
100.0000 mg | ORAL_TABLET | Freq: Two times a day (BID) | ORAL | Status: DC
  Administered 2017-12-21: 100 mg via ORAL
  Filled 2017-12-21: qty 2

## 2017-12-21 MED ORDER — DILTIAZEM HCL ER COATED BEADS 180 MG PO CP24: 180.0000 mg | ORAL_CAPSULE | Freq: Every day | ORAL | 0 refills | Status: DC

## 2017-12-21 MED ORDER — APIXABAN 5 MG PO TABS: 5.0000 mg | ORAL_TABLET | Freq: Two times a day (BID) | ORAL | 0 refills | Status: DC

## 2017-12-21 MED ORDER — DILTIAZEM HCL ER COATED BEADS 180 MG PO CP24
180.0000 mg | ORAL_CAPSULE | Freq: Every day | ORAL | Status: DC
  Administered 2017-12-21: 180 mg via ORAL
  Filled 2017-12-21: qty 1

## 2017-12-21 NOTE — Discharge Instructions (Signed)
Atrial Fibrillation Atrial fibrillation is a type of heartbeat that is irregular or fast (rapid). If you have this condition, your heart keeps quivering in a weird (chaotic) way. This condition can make it so your heart cannot pump blood normally. Having this condition gives a person more risk for stroke, heart failure, and other heart problems. There are different types of atrial fibrillation. Talk with your doctor to learn about the type that you have. Follow these instructions at home:  Take over-the-counter and prescription medicines only as told by your doctor.  If your doctor prescribed a blood-thinning medicine, take it exactly as told. Taking too much of it can cause bleeding. If you do not take enough of it, you will not have the protection that you need against stroke and other problems.  Do not use any tobacco products. These include cigarettes, chewing tobacco, and e-cigarettes. If you need help quitting, ask your doctor.  If you have apnea (obstructive sleep apnea), manage it as told by your doctor.  Do not drink alcohol.  Do not drink beverages that have caffeine. These include coffee, soda, and tea.  Maintain a healthy weight. Do not use diet pills unless your doctor says they are safe for you. Diet pills may make heart problems worse.  Follow diet instructions as told by your doctor.  Exercise regularly as told by your doctor.  Keep all follow-up visits as told by your doctor. This is important. Contact a doctor if:  You notice a change in the speed, rhythm, or strength of your heartbeat.  You are taking a blood-thinning medicine and you notice more bruising.  You get tired more easily when you move or exercise. Get help right away if:  You have pain in your chest or your belly (abdomen).  You have sweating or weakness.  You feel sick to your stomach (nauseous).  You notice blood in your throw up (vomit), poop (stool), or pee (urine).  You are short of  breath.  You suddenly have swollen feet and ankles.  You feel dizzy.  Your suddenly get weak or numb in your face, arms, or legs, especially if it happens on one side of your body.  You have trouble talking, trouble understanding, or both.  Your face or your eyelid droops on one side. These symptoms may be an emergency. Do not wait to see if the symptoms will go away. Get medical help right away. Call your local emergency services (911 in the [place] Do not drive yourself to the hospital. This information is not intended to replace advice given to you by your health care provider. Make sure you discuss any questions you have with your health care provider. Document Released: 11/16/2007 Document Revised: 07/15/2015 Document Reviewed: 06/03/2014 Elsevier Interactive Patient Education  2018 [place] on my medicine - Pradaxa (dabigatran)  This medication education was reviewed with me or my healthcare representative as part of my discharge preparation.   Why was Pradaxa prescribed for you? Pradaxa was prescribed for you to reduce the risk of forming blood clots that cause a stroke if you have a medical condition called atrial fibrillation (a type of irregular heartbeat).    What do you Need to know about PradAXa? Take your Pradaxa TWICE DAILY - one capsule in the morning and one tablet in the evening with or without food.  It would be best to take the doses about the same time each day.  The capsules should not be broken, chewed or opened -  they must be swallowed whole.  Do not store Pradaxa in other medication containers - once the bottle is opened the Pradaxa should be used within FOUR months; throw away any capsules that havent been by that time.  Take Pradaxa exactly as prescribed by your doctor.  DO NOT stop taking Pradaxa without talking to the doctor who prescribed the medication.  Stopping without other stroke prevention medication to take the place of Pradaxa  may increase your risk of developing a clot that causes a stroke.  Refill your prescription before you run out.  After discharge, you should have regular check-up appointments with your healthcare provider that is prescribing your Pradaxa.  In the future your dose may need to be changed if your kidney function or weight changes by a significant amount.  What do you do if you miss a dose? If you miss a dose, take it as soon as you remember on the same day.  If your next dose is less than 6 hours away, skip the missed dose.  Do not take two doses of PRADAXA at the same time.  Important Safety Information A possible side effect of Pradaxa is bleeding. You should call your healthcare provider right away if you experience any of the following: ? Bleeding from an injury or your nose that does not stop. ? Unusual colored urine (red or dark brown) or unusual colored stools (red or black). ? Unusual bruising for unknown reasons. ? A serious fall or if you hit your head (even if there is no bleeding).  Some medicines may interact with Pradaxa and might increase your risk of bleeding or clotting while on Pradaxa. To help avoid this, consult your healthcare provider or pharmacist prior to using any new prescription or non-prescription medications, including herbals, vitamins, non-steroidal anti-inflammatory drugs (NSAIDs) and supplements.  This website has more information on Pradaxa (dabigatran): https://www.pradaxa.com    Diabetes Mellitus and Nutrition When you have diabetes (diabetes mellitus), it is very important to have healthy eating habits because your blood sugar (glucose) levels are greatly affected by what you eat and drink. Eating healthy foods in the appropriate amounts, at about the same times every day, can help you:  Control your blood glucose.  Lower your risk of heart disease.  Improve your blood pressure.  Reach or maintain a healthy weight.  Every person with diabetes is  different, and each person has different needs for a meal plan. Your health care provider may recommend that you work with a diet and nutrition specialist (dietitian) to make a meal plan that is best for you. Your meal plan may vary depending on factors such as:  The calories you need.  The medicines you take.  Your weight.  Your blood glucose, blood pressure, and cholesterol levels.  Your activity level.  Other health conditions you have, such as heart or kidney disease.  How do carbohydrates affect me? Carbohydrates affect your blood glucose level more than any other type of food. Eating carbohydrates naturally increases the amount of glucose in your blood. Carbohydrate counting is a method for keeping track of how many carbohydrates you eat. Counting carbohydrates is important to keep your blood glucose at a healthy level, especially if you use insulin or take certain oral diabetes medicines. It is important to know how many carbohydrates you can safely have in each meal. This is different for every person. Your dietitian can help you calculate how many carbohydrates you should have at each meal and for snack. Foods  that contain carbohydrates include:  Bread, cereal, rice, pasta, and crackers.  Potatoes and corn.  Peas, beans, and lentils.  Milk and yogurt.  Fruit and juice.  Desserts, such as cakes, cookies, ice cream, and candy.  How does alcohol affect me? Alcohol can cause a sudden decrease in blood glucose (hypoglycemia), especially if you use insulin or take certain oral diabetes medicines. Hypoglycemia can be a life-threatening condition. Symptoms of hypoglycemia (sleepiness, dizziness, and confusion) are similar to symptoms of having too much alcohol. If your health care provider says that alcohol is safe for you, follow these guidelines:  Limit alcohol intake to no more than 1 drink per day for nonpregnant women and 2 drinks per day for men. One drink equals 12 oz of  beer, 5 oz of wine, or 1 oz of hard liquor.  Do not drink on an empty stomach.  Keep yourself hydrated with water, diet soda, or unsweetened iced tea.  Keep in mind that regular soda, juice, and other mixers may contain a lot of sugar and must be counted as carbohydrates.  What are tips for following this plan? Reading food labels  Start by checking the serving size on the label. The amount of calories, carbohydrates, fats, and other nutrients listed on the label are based on one serving of the food. Many foods contain more than one serving per package.  Check the total grams (g) of carbohydrates in one serving. You can calculate the number of servings of carbohydrates in one serving by dividing the total carbohydrates by 15. For example, if a food has 30 g of total carbohydrates, it would be equal to 2 servings of carbohydrates.  Check the number of grams (g) of saturated and trans fats in one serving. Choose foods that have low or no amount of these fats.  Check the number of milligrams (mg) of sodium in one serving. Most people should limit total sodium intake to less than 2,300 mg per day.  Always check the nutrition information of foods labeled as "low-fat" or "nonfat". These foods may be higher in added sugar or refined carbohydrates and should be avoided.  Talk to your dietitian to identify your daily goals for nutrients listed on the label. Shopping  Avoid buying canned, premade, or processed foods. These foods tend to be high in fat, sodium, and added sugar.  Shop around the outside edge of the grocery store. This includes fresh fruits and vegetables, bulk grains, fresh meats, and fresh dairy. Cooking  Use low-heat cooking methods, such as baking, instead of high-heat cooking methods like deep frying.  Cook using healthy oils, such as olive, canola, or sunflower oil.  Avoid cooking with butter, cream, or high-fat meats. Meal planning  Eat meals and snacks regularly,  preferably at the same times every day. Avoid going long periods of time without eating.  Eat foods high in fiber, such as fresh fruits, vegetables, beans, and whole grains. Talk to your dietitian about how many servings of carbohydrates you can eat at each meal.  Eat 4-6 ounces of lean protein each day, such as lean meat, chicken, fish, eggs, or tofu. 1 ounce is equal to 1 ounce of meat, chicken, or fish, 1 egg, or 1/4 cup of tofu.  Eat some foods each day that contain healthy fats, such as avocado, nuts, seeds, and fish. Lifestyle   Check your blood glucose regularly.  Exercise at least 30 minutes 5 or more days each week, or as told by your health care  provider.  Take medicines as told by your health care provider.  Do not use any products that contain nicotine or tobacco, such as cigarettes and e-cigarettes. If you need help quitting, ask your health care provider.  Work with a Social worker or diabetes educator to identify strategies to manage stress and any emotional and social challenges. What are some questions to ask my health care provider?  Do I need to meet with a diabetes educator?  Do I need to meet with a dietitian?  What number can I call if I have questions?  When are the best times to check my blood glucose? Where to find more information:  American Diabetes Association: diabetes.org/food-and-fitness/food  Academy of Nutrition and Dietetics: PokerClues.dk  [employer] of Diabetes and Digestive and Kidney Diseases (NIH): ContactWire.be Summary  A healthy meal plan will help you control your blood glucose and maintain a healthy lifestyle.  Working with a diet and nutrition specialist (dietitian) can help you make a meal plan that is best for you.  Keep in mind that carbohydrates and alcohol have immediate effects on your blood glucose  levels. It is important to count carbohydrates and to use alcohol carefully. This information is not intended to replace advice given to you by your health care provider. Make sure you discuss any questions you have with your health care provider. Document Released: 11/03/2004 Document Revised: 03/13/2016 Document Reviewed: 03/13/2016 Elsevier Interactive Patient Education  2018 [place].    Blood Glucose Monitoring, Adult Monitoring your blood sugar (glucose) helps you manage your diabetes. It also helps you and your health care provider determine how well your diabetes management plan is working. Blood glucose monitoring involves checking your blood glucose as often as directed, and keeping a record (log) of your results over time. Why should I monitor my blood glucose? Checking your blood glucose regularly can:  Help you understand how food, exercise, illnesses, and medicines affect your blood glucose.  Let you know what your blood glucose is at any time. You can quickly tell if you are having low blood glucose (hypoglycemia) or high blood glucose (hyperglycemia).  Help you and your health care provider adjust your medicines as needed.  When should I check my blood glucose? Follow instructions from your health care provider about how often to check your blood glucose. This may depend on:  The type of diabetes you have.  How well-controlled your diabetes is.  Medicines you are taking.  If you have type 1 diabetes:  Check your blood glucose at least 2 times a day.  Also check your blood glucose: ? Before every insulin injection. ? Before and after exercise. ? Between meals. ? 2 hours after a meal. ? Occasionally between 2:00 a.m. and 3:00 a.m., as directed. ? Before potentially dangerous tasks, like driving or using heavy machinery. ? At bedtime.  You may need to check your blood glucose more often, up to 6-10 times a day: ? If you use an insulin pump. ? If you need  multiple daily injections (MDI). ? If your diabetes is not well-controlled. ? If you are ill. ? If you have a history of severe hypoglycemia. ? If you have a history of not knowing when your blood glucose is getting low (hypoglycemia unawareness). If you have type 2 diabetes:  If you take insulin or other diabetes medicines, check your blood glucose at least 2 times a day.  If you are on intensive insulin therapy, check your blood glucose at least 4 times  a day. Occasionally, you may also need to check between 2:00 a.m. and 3:00 a.m., as directed.  Also check your blood glucose: ? Before and after exercise. ? Before potentially dangerous tasks, like driving or using heavy machinery.  You may need to check your blood glucose more often if: ? Your medicine is being adjusted. ? Your diabetes is not well-controlled. ? You are ill. What is a blood glucose log?  A blood glucose log is a record of your blood glucose readings. It helps you and your health care provider: ? Look for patterns in your blood glucose over time. ? Adjust your diabetes management plan as needed.  Every time you check your blood glucose, write down your result and notes about things that may be affecting your blood glucose, such as your diet and exercise for the day.  Most glucose meters store a record of glucose readings in the meter. Some meters allow you to download your records to a computer. How do I check my blood glucose? Follow these steps to get accurate readings of your blood glucose: Supplies needed   Blood glucose meter.  Test strips for your meter. Each meter has its own strips. You must use the strips that come with your meter.  A needle to prick your finger (lancet). Do not use lancets more than once.  A device that holds the lancet (lancing device).  A journal or log book to write down your results. Procedure  Wash your hands with soap and water.  Prick the side of your finger (not the  tip) with the lancet. Use a different finger each time.  Gently rub the finger until a small drop of blood appears.  Follow instructions that come with your meter for inserting the test strip, applying blood to the strip, and using your blood glucose meter.  Write down your result and any notes. Alternative testing sites  Some meters allow you to use areas of your body other than your finger (alternative sites) to test your blood.  If you think you may have hypoglycemia, or if you have hypoglycemia unawareness, do not use alternative sites. Use your finger instead.  Alternative sites may not be as accurate as the fingers, because blood flow is slower in these areas. This means that the result you get may be delayed, and it may be different from the result that you would get from your finger.  The most common alternative sites are: ? Forearm. ? Thigh. ? Palm of the hand. Additional tips  Always keep your supplies with you.  If you have questions or need help, all blood glucose meters have a 24-hour hotline number that you can call. You may also contact your health care provider.  After you use a few boxes of test strips, adjust (calibrate) your blood glucose meter by following instructions that came with your meter. This information is not intended to replace advice given to you by your health care provider. Make sure you discuss any questions you have with your health care provider. Document Released: 02/09/2003 Document Revised: 08/27/2015 Document Reviewed: 07/19/2015 Elsevier Interactive Patient Education  2017 [employer].

## 2017-12-21 NOTE — Progress Notes (Signed)
Progress Note  Patient Name: [name] Date of Encounter: 12/21/2017  Primary Cardiologist: Rozann Lesches, MD   Subjective   Mild palpitations at times  Inpatient Medications    Scheduled Meds: . apixaban  5 mg Oral BID  . cholecalciferol  400 Units Oral Daily  . diltiazem  120 mg Oral Daily  . gabapentin  300 mg Oral Daily  . hydrocortisone cream   Topical BID  . Influenza vac split quadrivalent PF  0.5 mL Intramuscular Tomorrow-1000  . insulin aspart  0-15 Units Subcutaneous TID WC  . insulin aspart  0-5 Units Subcutaneous QHS  . latanoprost  1 drop Both Eyes QHS  . magnesium oxide  200 mg Oral Daily  . metoprolol tartrate  100 mg Oral BID  . omega-3 acid ethyl esters  1 g Oral BID  . pantoprazole  40 mg Oral Daily  . sodium chloride flush  3 mL Intravenous Q12H  . timolol  1 drop Both Eyes Daily  . vitamin B-12   Oral Daily  . vitamin C  500 mg Oral Daily  . zolpidem  5 mg Oral QHS   Continuous Infusions: . sodium chloride     PRN Meds: sodium chloride, acetaminophen **OR** acetaminophen, HYDROcodone-acetaminophen, MUSCLE RUB, nitroGLYCERIN, ondansetron **OR** ondansetron (ZOFRAN) IV, sodium chloride flush   Vital Signs    Vitals:   12/21/17 0500 12/21/17 0600 12/21/17 0700 12/21/17 0736  BP: 122/85 122/63    Pulse: 88 (!) 43 81 74  Resp: 16 (!) 22 (!) 22 19  Temp:    98.3 F (36.8 C)  TempSrc:    Oral  SpO2: 97% 97% 99% 98%  Weight:      Height:        Intake/Output Summary (Last 24 hours) at 12/21/2017 0836 Last data filed at 12/21/2017 0400 Gross per 24 hour  Intake 960 ml  Output -  Net 960 ml   Filed Weights   12/17/17 1217 12/19/17 0404 12/20/17 0418  Weight: 83.7 kg 84.1 kg 83.7 kg    Telemetry    afib variable rates - Personally Reviewed  ECG    na Physical Exam   GEN: No acute distress.   Neck: No JVD Cardiac: irreg, no murmurs, rubs, or gallops.  Respiratory: Clear to auscultation bilaterally. GI: Soft, nontender,  non-distended  MS: No edema; No deformity. Neuro:  Nonfocal  Psych: Normal affect   Labs    Chemistry Recent Labs  Lab 12/17/17 0943 12/18/17 0106 12/20/17 0347  NA 137 136 139  K 4.0 3.8 3.7  CL 100 100 102  CO2 27 26 29   GLUCOSE 234* 172* 163*  BUN 12 12 18   CREATININE 0.83 0.98 0.89  CALCIUM 9.5 9.1 9.2  GFRNONAA >60 54* >60  GFRAA >60 >60 >60  ANIONGAP 10 10 8      Hematology Recent Labs  Lab 12/17/17 0943 12/18/17 0106 12/20/17 0347  WBC 6.2 7.2 7.7  RBC 4.67 4.38 4.23  HGB 13.5 12.6 12.3  HCT 41.1 39.0 37.7  MCV 88.0 89.0 89.1  MCH 28.9 28.8 29.1  MCHC 32.8 32.3 32.6  RDW 12.2 12.2 12.2  PLT 232 226 237    Cardiac Enzymes Recent Labs  Lab 12/17/17 0943 12/17/17 1323 12/17/17 1837 12/18/17 0106  TROPONINI <0.03 <0.03 <0.03 <0.03   No results for input(s): TROPIPOC in the last 168 hours.   BNPNo results for input(s): BNP, PROBNP in the last 168 hours.   DDimer No results for input(s):  DDIMER in the last 168 hours.   Radiology    No results found.  Cardiac Studies    Patient Profile     [name] a 77 y.o.femalewith a hx of yo female history of CAD with DES to LAD in 2010 and LAD in 2017, HL, HTN,who is being seen today for the evaluation of new onset atrial fibrillation with RVRat the request ofDr Manuella Ghazi.  Assessment & Plan    1. Afib with RVR - we  increased her lopressor to 100mg  bid yesterday, stopped maxide to allow room with her bp for beta blocker  - continue eliquis for stroke prevention, CHADS2Vasc score is 6 - echo LVE 65-70%, mild LAE -- can consider DCCV after 3 weeks of anticoagas outpatient  -started dilt 120mg  yesterday. Rates 90s-120s, will increase to 180mg  today. Plan for discharge today.    2. CAD - admitted with chest pain and palpitations. Symptoms appear to be solely due to her afib with RVR, no evidence of ischemia by EKG or enzymes.  [place] for discharge today, further adjustments of afib meds  as outpatient. We will arrange outpatient f/u.   For questions or updates, please contact [place] Please consult [email or web address] for contact info under        Signed, Carlyle Dolly, MD  12/21/2017, 8:36 AM

## 2017-12-21 NOTE — Care Management Important Message (Signed)
Important Message  Patient Details  Name: [name] MRN: [id number] Date of Birth: [birth year]   Medicare Important Message Given:  Yes    Shelda Altes 12/21/2017, 2:45 PM

## 2017-12-21 NOTE — Progress Notes (Signed)
Pt discharged home. PIV removed. No complications. Instructions including follow-up appointments and medications given to pt. Pt aware that she is to follow up with cardiology and her PCP ASAP. Prescriptions given to pt. Instructions about how to take medications and use of glucose meter to monitor BS discussed with pt. Understanding verbalized. Pt aware that she is to keep a log of BS values to take to her next appt with PCP. Pt taken out of unit via [place] by RN with all belongings.

## 2017-12-21 NOTE — Discharge Summary (Addendum)
Physician Discharge Summary  [name] KNL:[id number] DOB: September 11, 1940 DOA: 12/17/2017  PCP: Octavio Graves, DO  Admit date: 12/17/2017 Discharge date: 12/21/2017  Admitted From: home Disposition:  Home  Recommendations for Outpatient Follow-up:  1. Follow up with PCP in 1 week. 2. Follow up with cardiology scheduled as outpt with [facility] planned as outpt in about 3-4 weeks.  Home Health: none Equipment/Devices: none  Discharge Condition:fair CODE STATUS: full code Diet recommendation: Heart Healthy   Discharge Diagnoses:  Principal Problem:   Atrial fibrillation with RVR (Minor)  Active Problems:   Diabetes mellitus type 2, newly diagnosed   Mixed hyperlipidemia   Essential hypertension   Coronary atherosclerosis of native coronary artery   Chest pain, atypical   Brief narrative/ HPI 77 year old female with coronary artery disease with prior stents, palpitations and paroxysmal SVTs, hypertension, hyperlipidemiapresented to the ED with intermittent left shoulder pain followed by midsternal chest tightness with some relief after taking some nitroglycerin. She then started having palpitations and diaphoresis. patient found to have A. Fib with RVR on presentation not improved with bolus dose of IV Cardizem. She was then placed on IV Cardizem drip and monitored on stepdown unit.Cardiology consulted.  Hospital course  Principal problem New-onset atrial fibrillation with RVR (HCC) Required step down monitoring with Cardizem drip. 2-D echo showed normal LVEF of 65-70%, unable to assess diastolic function. TSH was normal. Seen by cardiology and recommended increasing metoprolol dose to 100 mg twice daily. She was weaned off Cardizem drip and started on oral Cardizem daily, dose increased to 180 mg today for discharge. Her CHADS2 vasc score is 39( Age , female sex, HTN, CAD and diabetes) for which she has been started on Eliquis.  Patient remains mechanically stable and will be  discharged on anticoagulation and adjusted meds for rate control. Cardiology plan on DC cardioversion after 3 weeks of anticoagulation.  Intermittent chest pain with coronary artery disease Appears to be secondary to her rapid A. Fib. No signs of ischemia. Resolved.  Essential hypertension Metoprolol dose increased and Cardizem added. blood pressure stable. discontinued Maxzide.  Dyslipidemia Continue statin.  New-onset diabetes mellitus type 2,  Patient reports that she was being told of having borderline diabetes. A1c of 7.5. CBG in 150s while in the hospital. Discussed meal planning and offered oral hypoglycemic agents but patient once to discuss with her PCP first. I have prescribed her glucometer with test Lancets. She will be educated on how to Use a Glucometer and has been told on keeping Log of Her Blood Glucose and showed to her PCP.   Family communication: None at bedside Consults: Cardiology  Procedure: 2-D echo Disposition: Home    Discharge Instructions   Allergies as of 12/21/2017   No Known Allergies     Medication List   Discontinued medication upon discharge triamterene-hydrochlorothiazide 37.5-25 MG tablet Commonly known as:  MAXZIDE-25    TAKE these medications   acetaminophen 650 MG CR tablet Commonly known as:  TYLENOL Take 1,300 mg by mouth every 8 (eight) hours as needed for pain.   apixaban 5 MG Tabs tablet Commonly known as:  ELIQUIS Take 1 tablet (5 mg total) by mouth 2 (two) times daily.   aspirin EC 81 MG tablet Take 81 mg by mouth every evening.   bimatoprost 0.01 % Soln Commonly known as:  LUMIGAN Place 1 drop into both eyes at bedtime.   BIOFREEZE 4 % Gel Generic drug:  Menthol (Topical Analgesic) Apply 1 application topically 4 (four) times daily  as needed (for pain).   diltiazem 180 MG 24 hr capsule Commonly known as:  CARDIZEM CD Take 1 capsule (180 mg total) by mouth daily. Start taking on:  12/22/2017   gabapentin 300 MG  capsule Commonly known as:  NEURONTIN Take 1 capsule by mouth daily.   Garlic 976 MG Tabs Take 1 tablet by mouth daily.   HYDROcodone-acetaminophen 10-325 MG tablet Commonly known as:  NORCO Take 1 tablet by mouth 4 (four) times daily as needed. For pain.   hydrocortisone 2.5 % cream Apply 2 (two) times daily topically.   ibuprofen 200 MG tablet Commonly known as:  ADVIL,MOTRIN Take 600 mg by mouth every 6 (six) hours as needed (for pain.).   MAGNESIUM CARBONATE PO Take 1 tablet by mouth daily.   metoprolol tartrate 50 MG tablet Commonly known as:  LOPRESSOR Take 2 tablets (100 mg total) by mouth 2 (two) times daily. What changed:  See the new instructions.   nitroGLYCERIN 0.4 MG SL tablet Commonly known as:  NITROSTAT Place 0.4 mg under the tongue every 5 (five) minutes as needed for chest pain.   pantoprazole 40 MG tablet Commonly known as:  PROTONIX TAKE ONE (1) TABLET EACH DAY   timolol 0.5 % ophthalmic solution Commonly known as:  TIMOPTIC Place 1 drop into both eyes daily.      TUMS PO Take 2 tablets by mouth 4 (four) times daily as needed (for acid reflux/indigestion).   TURMERIC PO Take 1 tablet by mouth daily.   VENTOLIN HFA 108 (90 Base) MCG/ACT inhaler Generic drug:  albuterol Inhale 1-2 puffs into the lungs every 4 (four) hours as needed for shortness of breath.   VITAMIN B 12 PO Take 1 tablet by mouth daily.   vitamin C 500 MG tablet Commonly known as:  ASCORBIC ACID Take 500 mg by mouth daily.   Vitamin D 400 units capsule Take 400 Units by mouth daily.   zolpidem 10 MG tablet Commonly known as:  AMBIEN Take 10 mg by mouth at bedtime as needed for sleep.      Follow-up Information    Erma Heritage, PA-C Follow up on 01/31/2018.   Specialties:  Physician Assistant, Cardiology Why:  Cardiology Hospital Follow-up on 01/31/2018 at 3:00PM with Bernerd Pho, PA-C (works with Dr. Domenic Polite) Contact information: [address] [place] [address] [place] [address] [phone]        Octavio Graves, DO. Schedule an appointment as soon as possible for a visit in 1 week(s).   Contact information: [address] [place] [address] [phone]        Satira Sark, MD .   Specialty:  Cardiology Contact information: [place] [address] [phone]          No Known Allergies   Procedures/Studies: Dg Chest Port 1 View  Result Date: 12/17/2017 CLINICAL DATA:  Central chest pain EXAM: PORTABLE CHEST 1 VIEW COMPARISON:  05/12/2016 FINDINGS: Mild peribronchial thickening. Heart and mediastinal contours are within normal limits. No focal opacities or effusions. No acute bony abnormality. IMPRESSION: Mild bronchitic changes. Electronically Signed   By: Rolm Baptise M.D.   On: 12/17/2017 09:59    2-D echo Study Conclusions  - Left ventricle: The cavity size was normal. Wall thickness was   increased in a pattern of moderate LVH. Systolic function was   vigorous. The estimated ejection fraction was in the range of 65%   to 70%. Wall motion was  normal; there were no regional wall   motion abnormalities. The study is not technically sufficient to   allow evaluation of LV diastolic function. - Aortic valve: Mildly calcified annulus. Trileaflet; mildly   thickened leaflets. Valve area (VTI): 3.26 cm^2. Valve area   (Vmax): 3.2 cm^2. - Mitral valve: Mildly calcified annulus. Mildly thickened leaflets   . - Left atrium: The atrium was mildly dilated. - Atrial septum: No defect or patent foramen ovale was identified. - Pulmonary arteries: Systolic pressure was mildly increased. PA   peak pressure: 31 mm Hg (S).  Subjective: Failed better overall. Heart rate occasionally racing up to 130s but otherwise asymptomatic.  Discharge Exam: Vitals:   12/21/17 0700 12/21/17 0736  BP:    Pulse: 81 74  Resp: (!) 22 19  Temp:  98.3 F (36.8 C)  SpO2: 99% 98%   Vitals:   12/21/17 0500  12/21/17 0600 12/21/17 0700 12/21/17 0736  BP: 122/85 122/63    Pulse: 88 (!) 43 81 74  Resp: 16 (!) 22 (!) 22 19  Temp:    98.3 F (36.8 C)  TempSrc:    Oral  SpO2: 97% 97% 99% 98%  Weight:      Height:        General:not in distress HEENT: Moist mucosa, supple neck Chest: Clear to auscultation bilaterally CVS: S1 and S2 irregular, no murmurs rub or gallop GI: Soft, nondistended, nontender Musculoskeletal: Warm, no edema     The results of significant diagnostics from this hospitalization (including imaging, microbiology, ancillary and laboratory) are listed below for reference.     Microbiology: Recent Results (from the past 240 hour(s))  MRSA PCR Screening     Status: None   Collection Time: 12/17/17 12:19 PM  Result Value Ref Range Status   MRSA by PCR NEGATIVE NEGATIVE Final    Comment:        The GeneXpert MRSA Assay (FDA approved for NASAL specimens only), is one component of a comprehensive MRSA colonization surveillance program. It is not intended to diagnose MRSA infection nor to guide or monitor treatment for MRSA infections. Performed at [facility], [address] [place], [place] [address]      Labs: BNP (last 3 results) No results for input(s): BNP in the last 8760 hours. Basic Metabolic Panel: Recent Labs  Lab 12/17/17 0943 12/17/17 1323 12/18/17 0106 12/20/17 0347  NA 137  --  136 139  K 4.0  --  3.8 3.7  CL 100  --  100 102  CO2 27  --  26 29  GLUCOSE 234*  --  172* 163*  BUN 12  --  12 18  CREATININE 0.83  --  0.98 0.89  CALCIUM 9.5  --  9.1 9.2  MG  --  2.0  --   --    Liver Function Tests: No results for input(s): AST, ALT, ALKPHOS, BILITOT, PROT, ALBUMIN in the last 168 hours. No results for input(s): LIPASE, AMYLASE in the last 168 hours. No results for input(s): AMMONIA in the last 168 hours. CBC: Recent Labs  Lab 12/17/17 0943 12/18/17 0106 12/20/17 0347  WBC 6.2 7.2 7.7  HGB 13.5 12.6 12.3  HCT 41.1 39.0 37.7   MCV 88.0 89.0 89.1  PLT 232 226 237   Cardiac Enzymes: Recent Labs  Lab 12/17/17 0943 12/17/17 1323 12/17/17 1837 12/18/17 0106  TROPONINI <0.03 <0.03 <0.03 <0.03   BNP: Invalid input(s): POCBNP CBG: Recent Labs  Lab 12/20/17 0757 12/20/17 1149 12/20/17  [place] 12/20/17 2032 12/21/17 0734  GLUCAP 154* 167* 120* 186* 152*   D-Dimer No results for input(s): DDIMER in the last 72 hours. Hgb A1c No results for input(s): HGBA1C in the last 72 hours. Lipid Profile No results for input(s): CHOL, HDL, LDLCALC, TRIG, CHOLHDL, LDLDIRECT in the last 72 hours. Thyroid function studies No results for input(s): TSH, T4TOTAL, T3FREE, THYROIDAB in the last 72 hours.  Invalid input(s): FREET3 Anemia work up No results for input(s): VITAMINB12, FOLATE, FERRITIN, TIBC, IRON, RETICCTPCT in the last 72 hours. Urinalysis    Component Value Date/Time   COLORURINE YELLOW 04/12/2010 1758   APPEARANCEUR CLEAR 04/12/2010 1758   LABSPEC 1.011 04/12/2010 1758   PHURINE 7.0 04/12/2010 1758   HGBUR NEGATIVE 04/12/2010 1758   BILIRUBINUR NEGATIVE 04/12/2010 1758   KETONESUR NEGATIVE 04/12/2010 1758   PROTEINUR NEGATIVE 04/12/2010 1758   UROBILINOGEN 0.2 04/12/2010 1758   NITRITE NEGATIVE 04/12/2010 1758   LEUKOCYTESUR  04/12/2010 1758    NEGATIVE MICROSCOPIC NOT DONE ON URINES WITH NEGATIVE PROTEIN, BLOOD, LEUKOCYTES, NITRITE, OR GLUCOSE <1000 mg/dL.   Sepsis Labs Invalid input(s): PROCALCITONIN,  WBC,  LACTICIDVEN Microbiology Recent Results (from the past 240 hour(s))  MRSA PCR Screening     Status: None   Collection Time: 12/17/17 12:19 PM  Result Value Ref Range Status   MRSA by PCR NEGATIVE NEGATIVE Final    Comment:        The GeneXpert MRSA Assay (FDA approved for NASAL specimens only), is one component of a comprehensive MRSA colonization surveillance program. It is not intended to diagnose MRSA infection nor to guide or monitor treatment for MRSA infections. Performed at  [facility], [address] [place], [place] [address]      Time coordinating discharge: 35 minutes  SIGNED:   Louellen Molder, MD  [facility] 12/21/2017, 10:54 AM Pager   If 7PM-7AM, please contact night-coverage [email or web address] Password [email or web address]

## 2017-12-27 ENCOUNTER — Ambulatory Visit: Payer: Medicare HMO

## 2017-12-28 ENCOUNTER — Encounter: Payer: Self-pay | Admitting: Cardiology

## 2018-01-14 ENCOUNTER — Other Ambulatory Visit: Payer: Self-pay

## 2018-01-14 ENCOUNTER — Ambulatory Visit (INDEPENDENT_AMBULATORY_CARE_PROVIDER_SITE_OTHER): Payer: Medicare HMO

## 2018-01-14 VITALS — BP 138/60 | HR 67 | Ht 64.0 in | Wt 181.0 lb

## 2018-01-14 DIAGNOSIS — I4891 Unspecified atrial fibrillation: Secondary | ICD-10-CM

## 2018-01-14 MED ORDER — DILTIAZEM HCL ER COATED BEADS 180 MG PO CP24: 180.0000 mg | ORAL_CAPSULE | Freq: Every day | ORAL | 3 refills | Status: DC

## 2018-01-14 MED ORDER — APIXABAN 5 MG PO TABS: 5.0000 mg | ORAL_TABLET | Freq: Two times a day (BID) | ORAL | 3 refills | Status: DC

## 2018-01-14 NOTE — Progress Notes (Signed)
Post ER EKG done, HR 67    Refilled Eliquis and Diltiazem

## 2018-01-14 NOTE — Patient Instructions (Signed)
Keep apt 01/31/18 at 3 pm with B Strader PA-C  Stay on same medications, unless we call you back

## 2018-01-31 ENCOUNTER — Encounter: Payer: Self-pay | Admitting: Student

## 2018-01-31 ENCOUNTER — Encounter: Payer: Self-pay | Admitting: *Deleted

## 2018-01-31 ENCOUNTER — Ambulatory Visit: Payer: Medicare HMO | Admitting: Student

## 2018-01-31 VITALS — BP 138/68 | HR 71 | Ht 63.0 in | Wt 178.0 lb

## 2018-01-31 DIAGNOSIS — I2510 Atherosclerotic heart disease of native coronary artery without angina pectoris: Secondary | ICD-10-CM | POA: Diagnosis not present

## 2018-01-31 DIAGNOSIS — R0609 Other forms of dyspnea: Secondary | ICD-10-CM | POA: Diagnosis not present

## 2018-01-31 DIAGNOSIS — E782 Mixed hyperlipidemia: Secondary | ICD-10-CM

## 2018-01-31 DIAGNOSIS — I10 Essential (primary) hypertension: Secondary | ICD-10-CM | POA: Diagnosis not present

## 2018-01-31 DIAGNOSIS — R0600 Dyspnea, unspecified: Secondary | ICD-10-CM

## 2018-01-31 DIAGNOSIS — I480 Paroxysmal atrial fibrillation: Secondary | ICD-10-CM | POA: Diagnosis not present

## 2018-01-31 MED ORDER — METOPROLOL TARTRATE 50 MG PO TABS: 100.0000 mg | ORAL_TABLET | Freq: Two times a day (BID) | ORAL | 3 refills | Status: DC

## 2018-01-31 MED ORDER — DILTIAZEM HCL ER COATED BEADS 180 MG PO CP24: 180.0000 mg | ORAL_CAPSULE | Freq: Every day | ORAL | 3 refills | Status: DC

## 2018-01-31 NOTE — Patient Instructions (Signed)
Medication Instructions:  Your physician recommends that you continue on your current medications as directed. Please refer to the Current Medication list given to you today.  If you need a refill on your cardiac medications before your next appointment, please call your pharmacy.   Lab work: NONE  If you have labs (blood work) drawn today and your tests are completely normal, you will receive your results only by: Marland Kitchen MyChart Message (if you have MyChart) OR . A paper copy in the mail If you have any lab test that is abnormal or we need to change your treatment, we will call you to review the results.  Testing/Procedures: NONE   Follow-Up: At [facility], you and your health needs are our priority.  As part of our continuing mission to provide you with exceptional heart care, we have created designated Provider Care Teams.  These Care Teams include your primary Cardiologist (physician) and Advanced Practice Providers (APPs -  Physician Assistants and Nurse Practitioners) who all work together to provide you with the care you need, when you need it. You will need a follow up appointment as planned.  Please call our office 2 months in advance to schedule this appointment.  You may see Rozann Lesches, MD or one of the following Advanced Practice Providers on your designated Care Team:   Bernerd Pho, PA-C [facility]) . Ermalinda Barrios, PA-C ([place])  Any Other Special Instructions Will Be Listed Below (If Applicable). Thank you for choosing [facility]!

## 2018-01-31 NOTE — Progress Notes (Signed)
Cardiology Office Note    Date:  01/31/2018   ID:  [name], DOB 06-22-1940, MRN [id number]  PCP:  Octavio Graves, DO  Cardiologist: Rozann Lesches, MD    Chief Complaint  Patient presents with  . Hospitalization Follow-up    History of Present Illness:    [name] is a 77 y.o. female with past medical history of CAD (s/p DES to LAD in 2010 and repeat DES to LAD in 02/2015), HTN, HLD, and Jehovah's Witness who presents to the office today for hospital follow-up.  She was recently admitted to [facility] on 12/17/2017 for evaluation of chest pain and palpitations. Cyclic troponin values remained negative but she was found to be in atrial fibrillation with RVR upon admission. She was initially started on IV Cardizem which was transitioned to PO Lopressor and Cardizem during admission. Given her CHA2DS2-VASc Score of 6, she was started on Eliquis 5 mg twice daily for anticoagulation. At the time of discharge, she was continued on Lopressor 100 mg twice daily and Cardizem CD 180 mg daily with consideration of an outpatient DCCV following 3 weeks of anticoagulation.  Repeat EKG in the office on 01/14/2018 showed she had converted to NSR since hospital discharge.   In talking with the patient today, she denies any repeat episodes of chest pain or palpitations since her recent hospitalization. She does report still having dyspnea with exertion which was occurring prior to her hospitalization but has been persistent since discharge. She notices this mostly when walking to her neighbors house or around the grocery store. Denies any recent orthopnea, PND, or lower extremity edema.  She reports good compliance with her current medication regimen, including Eliquis. Denies any recent melena, hematochezia, or hematuria.   Past Medical History:  Diagnosis Date  . Arthritis   . Bulging lumbar disc   . Chronic lower back pain   . Coronary atherosclerosis of native coronary artery    DES LAD 02/2008, DES LAD 02/2015  . Depression   . Dyslipidemia   . Essential hypertension, benign   . Frequent headaches   . Glaucoma   . Palpitations   . PSVT (paroxysmal supraventricular tachycardia) ([place])   . Refusal of blood transfusions as patient is Jehovah's Witness   . Type 2 diabetes mellitus (HCC)    Borderline    Past Surgical History:  Procedure Laterality Date  . CARDIAC CATHETERIZATION N/A 02/23/2015   Procedure: Left Heart Cath and Coronary Angiography;  Surgeon: Sherren Mocha, MD;  Location: [facility];  Service: Cardiovascular;  Laterality: N/A;  . CARDIAC CATHETERIZATION N/A 02/23/2015   Procedure: Coronary Stent Intervention;  Surgeon: Sherren Mocha, MD;  Location: [place] [facility];  Service: Cardiovascular;  Laterality: N/A;  . CARDIAC CATHETERIZATION N/A 01/04/2016   Procedure: Left Heart Cath and Coronary Angiography;  Surgeon: Peter M Martinique, MD;  Location: [facility];  Service: Cardiovascular;  Laterality: N/A;  . CORONARY ANGIOPLASTY  02/23/2015  . CORONARY ANGIOPLASTY WITH STENT PLACEMENT  2009  . DILATION AND CURETTAGE OF UTERUS    . TUBAL LIGATION      Current Medications: Outpatient Medications Prior to Visit  Medication Sig Dispense Refill  . acetaminophen (TYLENOL) 650 MG CR tablet Take 1,300 mg by mouth every 8 (eight) hours as needed for pain.    Marland Kitchen apixaban (ELIQUIS) 5 MG TABS tablet Take 1 tablet (5 mg total) by mouth 2 (two) times daily. 60 tablet 3  . Ascorbic Acid (VITAMIN C)  500 MG tablet Take 500 mg by mouth daily.      Marland Kitchen aspirin EC 81 MG tablet Take 81 mg by mouth every evening.    . bimatoprost (LUMIGAN) 0.01 % SOLN Place 1 drop into both eyes at bedtime.    . blood glucose meter kit and supplies Dispense based on patient and insurance preference. Use up to four times daily as directed. (FOR ICD-10 E10.9, E11.9). 1 each 0  . Calcium Carbonate Antacid (TUMS PO) Take 2 tablets by mouth 4 (four) times daily as needed (for acid  reflux/indigestion).     . Cholecalciferol (VITAMIN D) 400 UNITS capsule Take 400 Units by mouth daily.      . Cyanocobalamin (VITAMIN B 12 PO) Take 1 tablet by mouth daily.    Marland Kitchen gabapentin (NEURONTIN) 300 MG capsule Take 1 capsule by mouth daily.    . Garlic 638 MG TABS Take 1 tablet by mouth daily.      Marland Kitchen HYDROcodone-acetaminophen (NORCO) 10-325 MG tablet Take 1 tablet by mouth 4 (four) times daily as needed. For pain.    . hydrocortisone 2.5 % cream Apply 2 (two) times daily topically. 30 g 0  . ibuprofen (ADVIL,MOTRIN) 200 MG tablet Take 600 mg by mouth every 6 (six) hours as needed (for pain.).     Marland Kitchen MAGNESIUM CARBONATE PO Take 1 tablet by mouth daily.    . Menthol, Topical Analgesic, (BIOFREEZE) 4 % GEL Apply 1 application topically 4 (four) times daily as needed (for pain).     . nitroGLYCERIN (NITROSTAT) 0.4 MG SL tablet Place 0.4 mg under the tongue every 5 (five) minutes as needed for chest pain.    . pantoprazole (PROTONIX) 40 MG tablet TAKE ONE (1) TABLET EACH DAY 90 tablet 2  . timolol (TIMOPTIC) 0.5 % ophthalmic solution Place 1 drop into both eyes daily.    . TURMERIC PO Take 1 tablet by mouth daily.    . VENTOLIN HFA 108 (90 Base) MCG/ACT inhaler Inhale 1-2 puffs into the lungs every 4 (four) hours as needed for shortness of breath.    . zolpidem (AMBIEN) 10 MG tablet Take 10 mg by mouth at bedtime as needed for sleep.     Marland Kitchen diltiazem (CARDIZEM CD) 180 MG 24 hr capsule Take 1 capsule (180 mg total) by mouth daily. 30 capsule 3  . metoprolol tartrate (LOPRESSOR) 50 MG tablet Take 2 tablets (100 mg total) by mouth 2 (two) times daily. 180 tablet 3   No facility-administered medications prior to visit.      Allergies:   Patient has no known allergies.   Social History   Socioeconomic History  . Marital status: Widowed    Spouse name: Not on file  . Number of children: Not on file  . Years of education: Not on file  . Highest education level: Not on file  Occupational  History  . Occupation: RETIRED  Social Needs  . Financial resource strain: Not on file  . Food insecurity:    Worry: Not on file    Inability: Not on file  . Transportation needs:    Medical: Not on file    Non-medical: Not on file  Tobacco Use  . Smoking status: Never Smoker  . Smokeless tobacco: Never Used  Substance and Sexual Activity  . Alcohol use: No    Alcohol/week: 0.0 standard drinks  . Drug use: No  . Sexual activity: Never  Lifestyle  . Physical activity:    Days per week:  Not on file    Minutes per session: Not on file  . Stress: Not on file  Relationships  . Social connections:    Talks on phone: Not on file    Gets together: Not on file    Attends religious service: Not on file    Active member of club or organization: Not on file    Attends meetings of clubs or organizations: Not on file    Relationship status: Not on file  Other Topics Concern  . Not on file  Social History Narrative  . Not on file     Family History:  The patient's family history includes Cancer in her father; Heart attack in her mother.   Review of Systems:   Please see the history of present illness.     General:  No chills, fever, night sweats or weight changes.  Cardiovascular:  [name], edema, orthopnea, palpitations, paroxysmal nocturnal dyspnea. Positive for dyspnea on exertion.  Dermatological: No rash, lesions/masses Respiratory: No cough, dyspnea Urologic: No hematuria, dysuria Abdominal:   No nausea, vomiting, diarrhea, bright red blood per rectum, melena, or hematemesis Neurologic:  No visual changes, wkns, changes in mental status. All other systems reviewed and are otherwise negative except as noted above.   Physical Exam:    VS:  BP 138/68 (BP Location: Left Arm)   Pulse 71   Ht _0  (1.6 m)   Wt 178 lb (80.7 kg)   LMP  (LMP Unknown)   SpO2 98%   BMI 31.53 kg/m    General: Well developed, well nourished female appearing in no acute distress. Head:  Normocephalic, atraumatic, sclera non-icteric, no xanthomas, nares are without discharge.  Neck: No carotid bruits. JVD not elevated.  Lungs: Respirations regular and unlabored, without wheezes or rales.  Heart: Regular rate and rhythm. No S3 or S4.  No murmur, no rubs, or gallops appreciated. Abdomen: Soft, non-tender, non-distended with normoactive bowel sounds. No hepatomegaly. No rebound/guarding. No obvious abdominal masses. Msk:  Strength and tone appear normal for age. No joint deformities or effusions. Extremities: No clubbing or cyanosis. No lower extremity edema.  Distal pedal pulses are 2+ bilaterally. Neuro: Alert and oriented X 3. Moves all extremities spontaneously. No focal deficits noted. Psych:  Responds to questions appropriately with a normal affect. Skin: No rashes or lesions noted  Wt Readings from Last 3 Encounters:  01/31/18 178 lb (80.7 kg)  01/14/18 181 lb (82.1 kg)  12/20/17 184 lb 8.4 oz (83.7 kg)     Studies/Labs Reviewed:   EKG:  EKG is ordered today. The ekg ordered today demonstrates normal sinus rhythm, heart rate 68, with PAC's. Isolated TWI along Lead III.  Recent Labs: 12/17/2017: Magnesium 2.0; TSH 1.422 12/20/2017: BUN 18; Creatinine, Ser 0.89; Hemoglobin 12.3; Platelets 237; Potassium 3.7; Sodium 139   Lipid Panel    Component Value Date/Time   CHOL 198 12/17/2017 0943   TRIG 154 (H) 12/17/2017 0943   HDL 40 (L) 12/17/2017 0943   CHOLHDL 5.0 12/17/2017 0943   VLDL 31 12/17/2017 0943   LDLCALC 127 (H) 12/17/2017 0943    Additional studies/ records that were reviewed today include:   Cardiac Catheterization: 12/2015  The left ventricular systolic function is normal.  LV end diastolic pressure is moderately elevated.  The left ventricular ejection fraction is 55-65% by visual estimate.  Prox LAD-2 lesion, 0 %stenosed.  A drug eluting .  Prox LAD-1 lesion, 0 %stenosed.  A drug eluting .  [place] lesion,  30 %stenosed.  Prox Cx  lesion, 30 %stenosed.  Mid RCA lesion, 25 %stenosed.  Mid LAD lesion, 40 %stenosed.   1. Nonobstructive CAD. The stents in the LAD are patent 2. Normal LV function 3. Elevated LVEDP  Plan: continue medical therapy. Consider noncardiac causes of chest pain.  Echocardiogram: 12/18/2017 Study Conclusions  - Left ventricle: The cavity size was normal. Wall thickness was   increased in a pattern of moderate LVH. Systolic function was   vigorous. The estimated ejection fraction was in the range of 65%   to 70%. Wall motion was normal; there were no regional wall   motion abnormalities. The study is not technically sufficient to   allow evaluation of LV diastolic function. - Aortic valve: Mildly calcified annulus. Trileaflet; mildly   thickened leaflets. Valve area (VTI): 3.26 cm^2. Valve area   (Vmax): 3.2 cm^2. - Mitral valve: Mildly calcified annulus. Mildly thickened leaflets   . - Left atrium: The atrium was mildly dilated. - Atrial septum: No defect or patent foramen ovale was identified. - Pulmonary arteries: Systolic pressure was mildly increased. PA   peak pressure: 31 mm Hg (S).  Assessment:    1. Coronary artery disease involving native coronary artery of native heart without angina pectoris   2. Dyspnea on exertion   3. PAF (paroxysmal atrial fibrillation) ([place])   4. Essential hypertension   5. Mixed hyperlipidemia      Plan:   In order of problems listed above:  1. CAD/ Dyspnea on Exertion - the patient is s/p DES to LAD in 2010 and repeat DES to LAD in 02/2015. Recently admitted for atrial fibrillation with RVR and echocardiogram during admission showed a preserved EF with no regional WMA as outlined above.  - she has continued to experience dyspnea on exertion despite being back in NSR. Denies any associated chest pain or palpitations. Reports symptoms resemble her prior angina when she required stent placement in the past. I recommended she undergo repeat  ischemic evaluation with a Lexiscan Myoview for initial assessment but she wishes to monitor her symptoms for now. I encouraged her to make [place] aware if symptoms progress or she wishes to proceed with stress testing.  - she has remained on ASA since hospital discharge while also taking Eliquis. Will message Dr. Domenic Polite to see if he wishes for her to remain on both or stop ASA given the need for anticoagulation.   2. Paroxysmal Atrial Fibrillation - recently admitted for new-onset atrial fibrillation with RVR and she spontaneously converted back to NSR between the time of hospital discharge and repeat EKG on 11/25. Maintaining NSR by examination and EKG today.  - will continue on Cardizem CD and Lopressor for rate-control. - she denies any evidence of active bleeding. Continue Eliquis 46m BID for anticoagulation. Reports having recent labs with her PCP. Will request these records for reassessment of her Hgb and platelets following initiation of anticoagulation. Will need to follow closely as she is a [employer] manager, fast food   3. HTN - BP is well-controlled at 138/68 during today's visit. Continue current medication regimen including Cardizem CD and Lopressor.   4. HLD - followed by PCP. Goal LDL is < 70 with known CAD. Was previously on Zocor but not listed on her current medication list. Asked the patient to verify if she is taking this.    Medication Adjustments/Labs and Tests Ordered: Current medicines are reviewed at length with the patient today.  Concerns regarding medicines are outlined above.  Medication changes,  Labs and Tests ordered today are listed in the Patient Instructions below. Patient Instructions  Medication Instructions:  Your physician recommends that you continue on your current medications as directed. Please refer to the Current Medication list given to you today.  If you need a refill on your cardiac medications before your next appointment, please call your pharmacy.   Lab  work: NONE  If you have labs (blood work) drawn today and your tests are completely normal, you will receive your results only by: Marland Kitchen MyChart Message (if you have MyChart) OR . A paper copy in the mail If you have any lab test that is abnormal or we need to change your treatment, we will call you to review the results.  Testing/Procedures: NONE   Follow-Up: At [facility], you and your health needs are our priority.  As part of our continuing mission to provide you with exceptional heart care, we have created designated Provider Care Teams.  These Care Teams include your primary Cardiologist (physician) and Advanced Practice Providers (APPs -  Physician Assistants and Nurse Practitioners) who all work together to provide you with the care you need, when you need it. You will need a follow up appointment as planned.  Please call our office 2 months in advance to schedule this appointment.  You may see Rozann Lesches, MD or one of the following Advanced Practice Providers on your designated Care Team:   Bernerd Pho, PA-C [facility]) . Ermalinda Barrios, PA-C ([place])  Any Other Special Instructions Will Be Listed Below (If Applicable). Thank you for choosing [facility]!        Signed, Erma Heritage, PA-C  01/31/2018 9:19 PM    [place]. [address] [place], [place] [address] Phone: [phone]

## 2018-02-01 ENCOUNTER — Other Ambulatory Visit: Payer: Self-pay | Admitting: Student

## 2018-02-01 ENCOUNTER — Telehealth: Payer: Self-pay | Admitting: Student

## 2018-02-01 NOTE — Telephone Encounter (Signed)
    As outlined in the office note from yesterday, I did verify with Dr. Domenic Polite that it is okay for her to stop ASA given the need for anticoagulation. Called the patient to inform her of this but no answer and left voicemail detailing the above information and to call back with any questions.  Signed, Erma Heritage, PA-C 02/01/2018, 11:13 AM Pager: [phone]

## 2018-02-06 ENCOUNTER — Telehealth: Payer: Self-pay | Admitting: Student

## 2018-02-06 NOTE — Telephone Encounter (Addendum)
   Reviewed recent labs from her PCP. Hgb and electrolytes were stable but her LFT's were elevated. Her PCP probably already made her aware of these results and they should be following but please confirm. Looks like Zocor was not on her medication list at the time of her last office visit so perhaps this was discontinued by her PCP during that time-frame as well? If so, please make sure her medication list is updated.   Also, please make sure she did stop ASA as outlined in the prior telephone encounter on 02/01/2018 as she can just continue on Eliquis for now.   Signed, Erma Heritage, PA-C 02/06/2018, 12:51 PM Pager: [phone]

## 2018-02-07 NOTE — Telephone Encounter (Signed)
Called patient with test results. No answer. Left message to call back.  

## 2018-02-07 NOTE — Telephone Encounter (Signed)
Patient notified. Pt states she is not taking Zocor or ASA.

## 2018-02-28 ENCOUNTER — Encounter: Payer: Self-pay | Admitting: Cardiology

## 2018-02-28 NOTE — Progress Notes (Signed)
Cardiology Office Note  Date: 03/01/2018   ID: [name], DOB [birth year], MRN [id number]  PCP: Octavio Graves, DO  Primary Cardiologist: Rozann Lesches, MD   Chief Complaint  Patient presents with  . Coronary Artery Disease  . Atrial Fibrillation    History of Present Illness: [name] is a 78 y.o. female seen recently by Ms. Delano Metz on December 12.  She presents for a follow-up visit.  Since last encounter she describes 2 episodes of palpitations that were presumably breakthrough atrial fibrillation.  Otherwise no specific angina symptoms.  She describes NYHA class II dyspnea.  She is currently getting over an apparent viral URI.  CHADSVASC score is 6.  States that she is tolerating Eliquis without any bleeding problems.  I did review her baseline lab work from October 2019.  She is now off aspirin.  Reviewed her remaining cardiac regimen which is stable and outlined below.  She is on a combination of Lopressor and Cardizem CD for heart rate control.  Past Medical History:  Diagnosis Date  . Arthritis   . Bulging lumbar disc   . Chronic lower back pain   . Coronary atherosclerosis of native coronary artery    DES LAD 02/2008, DES LAD 02/2015  . Depression   . Dyslipidemia   . Essential hypertension, benign   . Frequent headaches   . Glaucoma   . Paroxysmal atrial fibrillation [facility])    Diagnosed October 2019  . PSVT (paroxysmal supraventricular tachycardia) ([place])   . Refusal of blood transfusions as patient is Jehovah's Witness   . Type 2 diabetes mellitus (HCC)    Borderline    Past Surgical History:  Procedure Laterality Date  . CARDIAC CATHETERIZATION N/A 02/23/2015   Procedure: Left Heart Cath and Coronary Angiography;  Surgeon: Sherren Mocha, MD;  Location: [place] [facility];  Service: Cardiovascular;  Laterality: N/A;  . CARDIAC CATHETERIZATION N/A 02/23/2015   Procedure: Coronary Stent Intervention;  Surgeon: Sherren Mocha, MD;  Location: [place] [facility];  Service: Cardiovascular;  Laterality: N/A;  . CARDIAC CATHETERIZATION N/A 01/04/2016   Procedure: Left Heart Cath and Coronary Angiography;  Surgeon: Peter M Martinique, MD;  Location: [place] [facility];  Service: Cardiovascular;  Laterality: N/A;  . CORONARY ANGIOPLASTY  02/23/2015  . CORONARY ANGIOPLASTY WITH STENT PLACEMENT  2009  . DILATION AND CURETTAGE OF UTERUS    . TUBAL LIGATION      Current Outpatient Medications  Medication Sig Dispense Refill  . acetaminophen (TYLENOL) 650 MG CR tablet Take 1,300 mg by mouth every 8 (eight) hours as needed for pain.    Marland Kitchen apixaban (ELIQUIS) 5 MG TABS tablet Take 1 tablet (5 mg total) by mouth 2 (two) times daily. 28 tablet 0  . Ascorbic Acid (VITAMIN C) 500 MG tablet Take 500 mg by mouth daily.      . bimatoprost (LUMIGAN) 0.01 % SOLN Place 1 drop into both eyes at bedtime.    . blood glucose meter kit and supplies Dispense based on patient and insurance preference. Use up to four times daily as directed. (FOR ICD-10 E10.9, E11.9). 1 each 0  . Calcium Carbonate Antacid (TUMS PO) Take 2 tablets by mouth 4 (four) times daily as needed (for acid reflux/indigestion).     . Cholecalciferol (VITAMIN D) 400 UNITS capsule Take 400 Units by mouth daily.      . Cyanocobalamin (VITAMIN B 12 PO) Take 1 tablet by mouth daily.    Marland Kitchen  diltiazem (CARDIZEM CD) 180 MG 24 hr capsule Take 1 capsule (180 mg total) by mouth daily. 90 capsule 3  . gabapentin (NEURONTIN) 300 MG capsule Take 1 capsule by mouth daily.    . Garlic 254 MG TABS Take 1 tablet by mouth daily.      Marland Kitchen HYDROcodone-acetaminophen (NORCO) 10-325 MG tablet Take 1 tablet by mouth 4 (four) times daily as needed. For pain.    . hydrocortisone 2.5 % cream Apply 2 (two) times daily topically. 30 g 0  . ibuprofen (ADVIL,MOTRIN) 200 MG tablet Take 600 mg by mouth every 6 (six) hours as needed (for pain.).     Marland Kitchen MAGNESIUM CARBONATE PO Take 1 tablet by mouth daily.    . Menthol, Topical Analgesic,  (BIOFREEZE) 4 % GEL Apply 1 application topically 4 (four) times daily as needed (for pain).     . metoprolol tartrate (LOPRESSOR) 50 MG tablet Take 2 tablets (100 mg total) by mouth 2 (two) times daily. 180 tablet 3  . nitroGLYCERIN (NITROSTAT) 0.4 MG SL tablet Place 0.4 mg under the tongue every 5 (five) minutes as needed for chest pain.    . pantoprazole (PROTONIX) 40 MG tablet TAKE ONE (1) TABLET EACH DAY 90 tablet 2  . timolol (TIMOPTIC) 0.5 % ophthalmic solution Place 1 drop into both eyes daily.    . TURMERIC PO Take 1 tablet by mouth daily.    . VENTOLIN HFA 108 (90 Base) MCG/ACT inhaler Inhale 1-2 puffs into the lungs every 4 (four) hours as needed for shortness of breath.    . zolpidem (AMBIEN) 10 MG tablet Take 10 mg by mouth at bedtime as needed for sleep.      No current facility-administered medications for this visit.    Allergies:  Patient has no known allergies.   Social History: The patient  reports that she has never smoked. She has never used smokeless tobacco. She reports that she does not drink alcohol or use drugs.   ROS:  Please see the history of present illness. Otherwise, complete review of systems is positive for none.  All other systems are reviewed and negative.   Physical Exam: VS:  BP 130/82   Pulse 75   Ht 5' 4"  (1.626 m)   Wt 178 lb (80.7 kg)   LMP  (LMP Unknown)   SpO2 98%   BMI 30.55 kg/m , BMI Body mass index is 30.55 kg/m.  Wt Readings from Last 3 Encounters:  03/01/18 178 lb (80.7 kg)  01/31/18 178 lb (80.7 kg)  01/14/18 181 lb (82.1 kg)    General: Patient appears comfortable at rest. HEENT: Conjunctiva and lids normal, oropharynx clear. Neck: Supple, no elevated JVP or carotid bruits, no thyromegaly. Lungs: Clear to auscultation, nonlabored breathing at rest. Cardiac: Regular rate and rhythm, no S3 or significant systolic murmur. Abdomen: Soft, nontender, bowel sounds present. Extremities: No pitting edema, distal pulses 2+. Skin: Warm  and dry. Musculoskeletal: No kyphosis. Neuropsychiatric: Alert and oriented x3, affect grossly appropriate.  ECG: I personally reviewed the tracing from 01/31/2018 which showed sinus rhythm with PAC, nonspecific ST-T changes.  Recent Labwork: 12/17/2017: Magnesium 2.0; TSH 1.422 12/20/2017: BUN 18; Creatinine, Ser 0.89; Hemoglobin 12.3; Platelets 237; Potassium 3.7; Sodium 139     Component Value Date/Time   CHOL 198 12/17/2017 0943   TRIG 154 (H) 12/17/2017 0943   HDL 40 (L) 12/17/2017 0943   CHOLHDL 5.0 12/17/2017 0943   VLDL 31 12/17/2017 0943   LDLCALC 127 (H) 12/17/2017  3507    Other Studies Reviewed Today:  Echocardiogram 12/18/2017: Study Conclusions  - Left ventricle: The cavity size was normal. Wall thickness was   increased in a pattern of moderate LVH. Systolic function was   vigorous. The estimated ejection fraction was in the range of 65%   to 70%. Wall motion was normal; there were no regional wall   motion abnormalities. The study is not technically sufficient to   allow evaluation of LV diastolic function. - Aortic valve: Mildly calcified annulus. Trileaflet; mildly   thickened leaflets. Valve area (VTI): 3.26 cm^2. Valve area   (Vmax): 3.2 cm^2. - Mitral valve: Mildly calcified annulus. Mildly thickened leaflets. - Left atrium: The atrium was mildly dilated. - Atrial septum: No defect or patent foramen ovale was identified. - Pulmonary arteries: Systolic pressure was mildly increased. PA   peak pressure: 31 mm Hg (S).  Cardiac catheterization 01/04/2016:  The left ventricular systolic function is normal.  LV end diastolic pressure is moderately elevated.  The left ventricular ejection fraction is 55-65% by visual estimate.  Prox LAD-2 lesion, 0 %stenosed.  A drug eluting .  Prox LAD-1 lesion, 0 %stenosed.  A drug eluting .  Ramus lesion, 30 %stenosed.  Prox Cx lesion, 30 %stenosed.  Mid RCA lesion, 25 %stenosed.  Mid LAD lesion, 40  %stenosed.   1. Nonobstructive CAD. The stents in the LAD are patent 2. Normal LV function 3. Elevated LVEDP  Assessment and Plan:  1.  CAD status post DES to the LAD in 2010 and 2017.  She reports no progressive angina at this time.  Angiography in 2017 showed patent stent sites.  She is now off aspirin with concurrent use of Eliquis.  Continue current medical therapy and observation.  2.  Paroxysmal atrial fibrillation with CHADSVASC score of 6.  She is now on Eliquis.  Continue Cardizem CD and Lopressor.  Depending on breakthrough frequency, may need to consider antiarrhythmic therapy.  Follow-up CBC and BMET for her next visit.  3.  Essential hypertension, blood pressure is adequately controlled today.  Current medicines were reviewed with the patient today.   Orders Placed This Encounter  Procedures  . Basic metabolic panel  . CBC    Disposition: Follow-up in 3 months.  Signed, Satira Sark, MD, [facility] 03/01/2018 9:44 AM    [facility] [place], [place], [place] [address] Phone: [phone]; Fax: [phone]

## 2018-03-01 ENCOUNTER — Ambulatory Visit: Payer: Medicare HMO | Admitting: Cardiology

## 2018-03-01 ENCOUNTER — Encounter: Payer: Self-pay | Admitting: Cardiology

## 2018-03-01 VITALS — BP 130/82 | HR 75 | Ht 64.0 in | Wt 178.0 lb

## 2018-03-01 DIAGNOSIS — I25119 Atherosclerotic heart disease of native coronary artery with unspecified angina pectoris: Secondary | ICD-10-CM

## 2018-03-01 DIAGNOSIS — Z79899 Other long term (current) drug therapy: Secondary | ICD-10-CM | POA: Diagnosis not present

## 2018-03-01 DIAGNOSIS — I480 Paroxysmal atrial fibrillation: Secondary | ICD-10-CM

## 2018-03-01 DIAGNOSIS — I10 Essential (primary) hypertension: Secondary | ICD-10-CM

## 2018-03-01 MED ORDER — APIXABAN 5 MG PO TABS: 5.0000 mg | ORAL_TABLET | Freq: Two times a day (BID) | ORAL | 0 refills | Status: DC

## 2018-03-01 NOTE — Patient Instructions (Addendum)
Medication Instructions:   Your physician recommends that you continue on your current medications as directed. Please refer to the Current Medication list given to you today.  Labwork:  Your physician recommends that you return for lab work in: 3 months just before your next visit to check your BMET & CBC-lab orders given during visit  Testing/Procedures:  NONE  Follow-Up:  Your physician recommends that you schedule a follow-up appointment in: 3 months.  Any Other Special Instructions Will Be Listed Below (If Applicable).  If you need a refill on your cardiac medications before your next appointment, please call your pharmacy.

## 2018-04-01 ENCOUNTER — Encounter: Payer: Self-pay | Admitting: *Deleted

## 2018-04-01 ENCOUNTER — Telehealth: Payer: Self-pay | Admitting: Cardiology

## 2018-04-01 MED ORDER — APIXABAN 5 MG PO TABS: 5.0000 mg | ORAL_TABLET | Freq: Two times a day (BID) | ORAL | 0 refills | Status: DC

## 2018-04-01 NOTE — Telephone Encounter (Signed)
Patient calling the office for samples of medication:   1.  What medication and dosage are you requesting samples for? Eliquis  2.  Are you currently out of this medication?  Has one day left   Will be home til 8:30

## 2018-04-01 NOTE — Telephone Encounter (Signed)
Patient notified samples ready for pick up.  Stated she will pick up this evening.

## 2018-04-02 ENCOUNTER — Ambulatory Visit: Payer: Medicare Other | Admitting: Neurology

## 2018-04-02 ENCOUNTER — Encounter: Payer: Self-pay | Admitting: Neurology

## 2018-04-02 VITALS — BP 162/72 | HR 74 | Ht 64.0 in | Wt 179.0 lb

## 2018-04-02 DIAGNOSIS — M542 Cervicalgia: Secondary | ICD-10-CM | POA: Diagnosis not present

## 2018-04-02 DIAGNOSIS — R200 Anesthesia of skin: Secondary | ICD-10-CM

## 2018-04-02 MED ORDER — GABAPENTIN 300 MG PO CAPS: 600.0000 mg | ORAL_CAPSULE | Freq: Three times a day (TID) | ORAL | 6 refills | Status: DC

## 2018-04-02 NOTE — Progress Notes (Addendum)
PATIENT: [name] DOB: 27-Aug-1940  Chief Complaint  Patient presents with  . Burning sensations    Reports a burning sensation in tongue and numbness in bottom lip.  She tried gabapentin 327m at bedtime but stopped it because it was not helpful.   . ENT    TLeta Baptist MD - referring provider  . PCP    [name]     HISTORICAL  Julie MTOMECA HELMis a 78years old female, seen in request by her primary care physician Dr. BMelina Copa [name] for evaluation of burning sensation at the tongue, facial numbness, initial evaluation was on April 02, 2018.  I have reviewed and summarized the referring note from the referring physician.  She had a past medical history of hypertension, atrial fibrillation, hyperlipidemia, glaucoma, type 2 diabetes, board line, not on medications   In June 2019, she felt a little bump at the right tip of her tongue, later she felt burning numbing sensation on the right side of her tongue, spreading to right mucus, right lower chin, she denied difficulty talking chewing, but she has sensitive to at the area, difficulty for her to chew, brushing on her tooth,  She was seen by ENT, dentist, no significant abnormality found,  I personally reviewed MRI of the brain with without contrast on October 16, 2017, mild generalized atrophy, mild supratentorium small vessel disease.  Her symptoms has been persistent actually progressively getting worse since its onset, now she had a persistent numbness at right lower chin, she denies visual loss, some right-sided neck pain  She has tried gabapentin 300 mg 3 times a day did not have significant side effect, but did not help her much  REVIEW OF SYSTEMS: Full 14 system review of systems performed and notable only for fatigue, chest pain, palpitations swelling legs, rash, itching, moles, blurred vision, shortness of breath, cough, wheezing, constipation, feeling hot, joint pain, cramps, aching muscles, numbness,  insomnia, depression, anxiety, not enough sleep, decreased energy, racing thoughts. All other review of systems were negative.  ALLERGIES: No Known Allergies  HOME MEDICATIONS: Current Outpatient Medications  Medication Sig Dispense Refill  . acetaminophen (TYLENOL) 650 MG CR tablet Take 1,300 mg by mouth every 8 (eight) hours as needed for pain.    .Marland Kitchenapixaban (ELIQUIS) 5 MG TABS tablet Take 1 tablet (5 mg total) by mouth 2 (two) times daily. 42 tablet 0  . Ascorbic Acid (VITAMIN C) 500 MG tablet Take 500 mg by mouth daily.      . bimatoprost (LUMIGAN) 0.01 % SOLN Place 1 drop into both eyes at bedtime.    . blood glucose meter kit and supplies Dispense based on patient and insurance preference. Use up to four times daily as directed. (FOR ICD-10 E10.9, E11.9). 1 each 0  . Calcium Carbonate Antacid (TUMS PO) Take 2 tablets by mouth 4 (four) times daily as needed (for acid reflux/indigestion).     . Cholecalciferol (VITAMIN D) 400 UNITS capsule Take 400 Units by mouth daily.      . Cyanocobalamin (VITAMIN B 12 PO) Take 1 tablet by mouth daily.    .Marland Kitchendiltiazem (CARDIZEM CD) 180 MG 24 hr capsule Take 1 capsule (180 mg total) by mouth daily. 90 capsule 3  . Garlic 1448MG TABS Take 1 tablet by mouth daily.      .Marland KitchenHYDROcodone-acetaminophen (NORCO) 10-325 MG tablet Take 1 tablet by mouth 4 (four) times daily as needed. For pain.    . hydrocortisone  2.5 % cream Apply 2 (two) times daily topically. 30 g 0  . ibuprofen (ADVIL,MOTRIN) 200 MG tablet Take 600 mg by mouth every 6 (six) hours as needed (for pain.).     Marland Kitchen MAGNESIUM CARBONATE PO Take 1 tablet by mouth daily.    . Menthol, Topical Analgesic, (BIOFREEZE) 4 % GEL Apply 1 application topically 4 (four) times daily as needed (for pain).     . metoprolol tartrate (LOPRESSOR) 50 MG tablet Take 2 tablets (100 mg total) by mouth 2 (two) times daily. 180 tablet 3  . nitroGLYCERIN (NITROSTAT) 0.4 MG SL tablet Place 0.4 mg under the tongue every 5 (five)  minutes as needed for chest pain.    . pantoprazole (PROTONIX) 40 MG tablet TAKE ONE (1) TABLET EACH DAY 90 tablet 2  . timolol (TIMOPTIC) 0.5 % ophthalmic solution Place 1 drop into both eyes daily.    . TURMERIC PO Take 1 tablet by mouth daily.    . VENTOLIN HFA 108 (90 Base) MCG/ACT inhaler Inhale 1-2 puffs into the lungs every 4 (four) hours as needed for shortness of breath.    . zolpidem (AMBIEN) 10 MG tablet Take 10 mg by mouth at bedtime as needed for sleep.      No current facility-administered medications for this visit.     PAST MEDICAL HISTORY: Past Medical History:  Diagnosis Date  . Anxiety   . Arthritis   . Atrial fibrillation ([place])   . Bulging lumbar disc   . Chronic lower back pain   . Coronary atherosclerosis of native coronary artery    DES LAD 02/2008, DES LAD 02/2015  . Depression   . Dyslipidemia   . Essential hypertension, benign   . Facial numbness   . Frequent headaches   . Glaucoma   . Hypertension   . Paroxysmal atrial fibrillation [facility])    Diagnosed October 2019  . PSVT (paroxysmal supraventricular tachycardia) ([place])   . Refusal of blood transfusions as patient is Jehovah's Witness   . Type 2 diabetes mellitus ([place])    Borderline    PAST SURGICAL HISTORY: Past Surgical History:  Procedure Laterality Date  . CARDIAC CATHETERIZATION N/A 02/23/2015   Procedure: Left Heart Cath and Coronary Angiography;  Surgeon: Sherren Mocha, MD;  Location: [place] [facility];  Service: Cardiovascular;  Laterality: N/A;  . CARDIAC CATHETERIZATION N/A 02/23/2015   Procedure: Coronary Stent Intervention;  Surgeon: Sherren Mocha, MD;  Location: [place] [facility];  Service: Cardiovascular;  Laterality: N/A;  . CARDIAC CATHETERIZATION N/A 01/04/2016   Procedure: Left Heart Cath and Coronary Angiography;  Surgeon: Peter M Martinique, MD;  Location: [place] [facility];  Service: Cardiovascular;  Laterality: N/A;  . CORONARY ANGIOPLASTY  02/23/2015  . CORONARY ANGIOPLASTY WITH  STENT PLACEMENT  2009  . DILATION AND CURETTAGE OF UTERUS    . TUBAL LIGATION      FAMILY HISTORY: Family History  Problem Relation Age of Onset  . Other Mother        "natural causes"  . Cancer Father        unsure of type    SOCIAL HISTORY: Social History   Socioeconomic History  . Marital status: Widowed    Spouse name: Not on file  . Number of children: 8  . Years of education: 10th grade  . Highest education level: Not on file  Occupational History  . Occupation: RETIRED  Social Needs  . Financial resource strain: Not on file  . Food insecurity:  Worry: Not on file    Inability: Not on file  . Transportation needs:    Medical: Not on file    Non-medical: Not on file  Tobacco Use  . Smoking status: Never Smoker  . Smokeless tobacco: Never Used  Substance and Sexual Activity  . Alcohol use: No    Alcohol/week: 0.0 standard drinks  . Drug use: No  . Sexual activity: Never  Lifestyle  . Physical activity:    Days per week: Not on file    Minutes per session: Not on file  . Stress: Not on file  Relationships  . Social connections:    Talks on phone: Not on file    Gets together: Not on file    Attends religious service: Not on file    Active member of club or organization: Not on file    Attends meetings of clubs or organizations: Not on file    Relationship status: Not on file  . Intimate partner violence:    Fear of current or ex partner: Not on file    Emotionally abused: Not on file    Physically abused: Not on file    Forced sexual activity: Not on file  Other Topics Concern  . Not on file  Social History Narrative   Lives at home alone.   Right-handed.   No caffeine use.     PHYSICAL EXAM   Vitals:   04/02/18 1013  BP: (!) 162/72  Pulse: 74  Weight: 179 lb (81.2 kg)  Height: _0  (1.626 m)    Not recorded      Body mass index is 30.73 kg/m.  PHYSICAL EXAMNIATION:  Gen: NAD, conversant, well nourised, obese, well groomed                      Cardiovascular: Regular rate rhythm, no peripheral edema, warm, nontender. Eyes: Conjunctivae clear without exudates or hemorrhage Neck: Supple, no carotid bruits. Pulmonary: Clear to auscultation bilaterally   NEUROLOGICAL EXAM:  MENTAL STATUS: Speech:    Speech is normal; fluent and spontaneous with normal comprehension.  Cognition:     Orientation to time, place and person     Normal recent and remote memory     Normal Attention span and concentration     Normal Language, naming, repeating,spontaneous speech     Fund of knowledge   CRANIAL NERVES: CN II: Visual fields are full to confrontation. Fundoscopic exam is normal with sharp discs and no vascular changes. Pupils are round equal and briskly reactive to light. CN III, IV, VI: extraocular movement are normal. No ptosis. CN V: Facial sensation is intact to pinprick in all 3 divisions bilaterally. Corneal responses are intact.  CN VII: Face is symmetric with normal eye closure and smile. CN VIII: Hearing is normal to rubbing fingers CN IX, X: Palate elevates symmetrically. Phonation is normal. CN XI: Head turning and shoulder shrug are intact CN XII: Tongue is midline with normal movements and no atrophy.  MOTOR: There is no pronator drift of out-stretched arms. Muscle bulk and tone are normal. Muscle strength is normal.  REFLEXES: Reflexes are 2+ and symmetric at the biceps, triceps, knees, and ankles. Plantar responses are flexor.  SENSORY: Intact to light touch, pinprick, positional sensation and vibratory sensation are intact in fingers and toes.  COORDINATION: Rapid alternating movements and fine finger movements are intact. There is no dysmetria on finger-to-nose and heel-knee-shin.    GAIT/STANCE: Posture is normal. Gait is steady  with normal steps, base, arm swing, and turning. Heel and toe walking are normal. Tandem gait is normal.  Romberg is absent.   DIAGNOSTIC DATA (LABS, IMAGING,  TESTING) - I reviewed patient records, labs, notes, testing and imaging myself where available.   ASSESSMENT AND PLAN  HEDY GARRO is a 78 y.o. female   Right face paresthesia  MRI of the brain with and without contrast in August 2019 failed to demonstrate etiology  Need to rule out right tongue/mandibular structural lesion  Proceed with CT of cervical soft tissue with without contrast  Laboratory evaluation including B12,  Increase gabapentin to 300 mg 2 tablets 3 times a day   Marcial Pacas, M.D. Ph.D.  [facility] [address], [place], [place] [address] Ph: [phone] Fax: [phone]  CC: Leta Baptist, MD, Octavio Graves, DO

## 2018-04-03 ENCOUNTER — Telehealth: Payer: Self-pay | Admitting: Neurology

## 2018-04-03 NOTE — Telephone Encounter (Signed)
[employer] medicare order sent to GI. No auth they will reach out to the pt to schedule.  °

## 2018-04-04 ENCOUNTER — Telehealth: Payer: Self-pay | Admitting: *Deleted

## 2018-04-04 LAB — CBC WITH DIFFERENTIAL/PLATELET
BASOS ABS: 0 10*3/uL (ref 0.0–0.2)
BASOS: 0 %
EOS (ABSOLUTE): 0.3 10*3/uL (ref 0.0–0.4)
Eos: 5 %
Hematocrit: 37.7 % (ref 34.0–46.6)
Hemoglobin: 13 g/dL (ref 11.1–15.9)
Immature Grans (Abs): 0 10*3/uL (ref 0.0–0.1)
Immature Granulocytes: 0 %
LYMPHS ABS: 1.8 10*3/uL (ref 0.7–3.1)
Lymphs: 34 %
MCH: 29.9 pg (ref 26.6–33.0)
MCHC: 34.5 g/dL (ref 31.5–35.7)
MCV: 87 fL (ref 79–97)
MONOS ABS: 0.5 10*3/uL (ref 0.1–0.9)
Monocytes: 9 %
NEUTROS ABS: 2.8 10*3/uL (ref 1.4–7.0)
Neutrophils: 52 %
PLATELETS: 171 10*3/uL (ref 150–450)
RBC: 4.35 x10E6/uL (ref 3.77–5.28)
RDW: 13 % (ref 11.7–15.4)
WBC: 5.4 10*3/uL (ref 3.4–10.8)

## 2018-04-04 LAB — COMPREHENSIVE METABOLIC PANEL
A/G RATIO: 1.8 (ref 1.2–2.2)
ALBUMIN: 4.3 g/dL (ref 3.7–4.7)
ALK PHOS: 103 IU/L (ref 39–117)
ALT: 14 IU/L (ref 0–32)
AST: 14 IU/L (ref 0–40)
BILIRUBIN TOTAL: 0.3 mg/dL (ref 0.0–1.2)
BUN / CREAT RATIO: 10 — AB (ref 12–28)
BUN: 8 mg/dL (ref 8–27)
CHLORIDE: 101 mmol/L (ref 96–106)
CO2: 24 mmol/L (ref 20–29)
Calcium: 9.6 mg/dL (ref 8.7–10.3)
Creatinine, Ser: 0.78 mg/dL (ref 0.57–1.00)
GFR calc Af Amer: 85 mL/min/{1.73_m2} (ref >59)
GFR calc non Af Amer: 74 mL/min/{1.73_m2} (ref >59)
GLOBULIN, TOTAL: 2.4 g/dL (ref 1.5–4.5)
Glucose: 153 mg/dL — ABNORMAL HIGH (ref 65–99)
POTASSIUM: 4.3 mmol/L (ref 3.5–5.2)
SODIUM: 141 mmol/L (ref 134–144)
Total Protein: 6.7 g/dL (ref 6.0–8.5)

## 2018-04-04 LAB — COPPER, SERUM: COPPER: 139 ug/dL (ref 72–166)

## 2018-04-04 LAB — VITAMIN D 25 HYDROXY (VIT D DEFICIENCY, FRACTURES): VIT D 25 HYDROXY: 62.5 ng/mL (ref 30.0–100.0)

## 2018-04-04 LAB — VITAMIN B12: VITAMIN B 12: 1624 pg/mL — AB (ref 232–1245)

## 2018-04-04 NOTE — Telephone Encounter (Signed)
Spoke to patient and notified her of the lab results below.  She verbalized understanding.

## 2018-04-04 NOTE — Telephone Encounter (Signed)
-----   Message from Marcial Pacas, MD sent at 04/04/2018  9:45 AM EST ----- Please call patient for no significant abnormality on laboratory evaluations.  Mild elevated Glucose 153 could due to timing of the blood sample and the last meal

## 2018-04-15 ENCOUNTER — Ambulatory Visit
Admission: RE | Admit: 2018-04-15 | Discharge: 2018-04-15 | Disposition: A | Discharge status: Home / Self Care | Payer: Medicare Other | Source: Ambulatory Visit | Attending: Neurology | Admitting: Neurology

## 2018-04-15 ENCOUNTER — Telehealth: Payer: Self-pay | Admitting: Neurology

## 2018-04-15 DIAGNOSIS — M542 Cervicalgia: Secondary | ICD-10-CM

## 2018-04-15 DIAGNOSIS — R200 Anesthesia of skin: Secondary | ICD-10-CM

## 2018-04-15 MED ORDER — IOPAMIDOL (ISOVUE-300) INJECTION 61%
75.0000 mL | Freq: Once | INTRAVENOUS | Status: AC | PRN
  Administered 2018-04-15: 75 mL via INTRAVENOUS

## 2018-04-15 NOTE — Telephone Encounter (Signed)
I spoke to the patient - she is aware of her results and verbalized understanding.  She would like these sent to her PCP (Dr. Octavio Graves) and they have been faxed to her attention.    I offered to schedule her follow up appt but she wanted to call back after she checked on her daughters' work schedules.  I provided her with our call back number.

## 2018-04-15 NOTE — Telephone Encounter (Signed)
Please call patient, CT of cervical soft tissue showed no abnormality to explain her current symptoms.  Evidence of bilateral carotid artery and proximal left subclavian atherosclerotic changes,   IMPRESSION: No abnormality seen to explain the presenting symptoms.  Thyroid goiter.  Atherosclerotic change at the carotid bifurcations. 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

## 2018-04-15 NOTE — Telephone Encounter (Signed)
I left patient a message letting her know we are trying to reach her to review results.  I provided our office number but also let her know that our phone lines are temporarily down while a cut line is being repaired.  I will try her back again this afternoon.

## 2018-04-22 ENCOUNTER — Telehealth: Payer: Self-pay | Admitting: Cardiology

## 2018-04-22 MED ORDER — APIXABAN 5 MG PO TABS: 5.0000 mg | ORAL_TABLET | Freq: Two times a day (BID) | ORAL | 0 refills | Status: DC

## 2018-04-22 NOTE — Telephone Encounter (Signed)
Patient notified samples ready for pick up via voice mail.

## 2018-04-22 NOTE — Telephone Encounter (Signed)
Patient calling the office for samples of medication:   1.  What medication and dosage are you requesting samples for?   ELIQUIS) 5 MG TABS    2.  Are you currently out of this medication? YES

## 2018-05-08 ENCOUNTER — Telehealth (INDEPENDENT_AMBULATORY_CARE_PROVIDER_SITE_OTHER): Payer: Medicare Other | Admitting: Neurology

## 2018-05-08 DIAGNOSIS — R200 Anesthesia of skin: Secondary | ICD-10-CM

## 2018-05-08 NOTE — Telephone Encounter (Signed)
Chief Complains/Reason for telephone visit: Scheduled visit Names of all people present and their role: Patient Relevant History: [name] is a 78 years old female, seen in request by her primary care physician Dr. Melina Copa, Caren Griffins, for evaluation of burning sensation at the tongue, facial numbness, initial evaluation was on April 02, 2018.  I have reviewed and summarized the referring note from the referring physician.  She had a past medical history of hypertension, atrial fibrillation, hyperlipidemia, glaucoma, type 2 diabetes, board line, not on medications   In June 2019, she felt a little bump at the right tip of her tongue, later she felt burning numbing sensation on the right side of her tongue, spreading to right mucus, right lower chin, she denied difficulty talking chewing, but she has sensitive to at the area, difficulty for her to chew, brushing on her tooth,  She was seen by ENT, dentist, no significant abnormality found,  I personally reviewed MRI of the brain with without contrast on October 16, 2017, mild generalized atrophy, mild supratentorium small vessel disease.  Her symptoms has been persistent actually progressively getting worse since its onset, now she had a persistent numbness at right lower chin, she denies visual loss, some right-sided neck pain  She has tried gabapentin 300 mg 3 times a day did not have significant side effect, but did not help her much  During today's telephone visit, patient described she continue to feel raw sensation in her tongue, especially at the right side, and adjacent mucus, she has no trouble swallowing, but oftentimes woke up in the morning with mucus in her throat, she has been taking Protonix 40 mg for few years, she denies significant heartburn, she has tried gabapentin for 2 months now, up to 300 mg 2 tablets 3 times a day, there was no significant change noted.  Results Reviewed: I have reviewed CT cervical soft tissue on  April 15, 2018 result over the phone, no abnormality seen to explain presenting symptoms, thyroid goiter, atherosclerotic changes at carotid bifurcation, proximal subclavian artery.  Laboratory evaluations in February 2020 showed normal vitamin D, copper level, CBC, CMP showed elevated glucose 153, vitamin B12 level 1624, she has been on vitamin B12 supplement  Assessment and Plan: Alternation of the sensation of her tongue  Unsure etiology,  Differentiation diagnosis including medication side effect, I have advised her to stop Protonix, and gabapentin, which she reported is not helpful,  If she remains symptomatic, may consider continue follow-up with her dentist.   Documentation of Time of Medical Discussion:   [id number] minutes of medical discussion    Marcial Pacas, M.D. Ph.D.  [facility] [place], [place] [address] Phone: [phone] Fax:      [phone]

## 2018-05-09 ENCOUNTER — Ambulatory Visit: Payer: Medicare Other | Admitting: Neurology

## 2018-05-14 ENCOUNTER — Other Ambulatory Visit: Payer: Self-pay | Admitting: *Deleted

## 2018-05-14 MED ORDER — APIXABAN 5 MG PO TABS: 5.0000 mg | ORAL_TABLET | Freq: Two times a day (BID) | ORAL | 0 refills | Status: DC

## 2018-05-30 ENCOUNTER — Telehealth: Payer: Self-pay | Admitting: *Deleted

## 2018-05-30 NOTE — Telephone Encounter (Signed)
Eliquis 5 mg samples , 2 boxes #14 tabs/box,  Lot [id number], exp 07/2020 to be picked up 06/03/18

## 2018-05-30 NOTE — Telephone Encounter (Signed)
Pt contacted and consented to virtual appt with Dr Domenic Polite 06/07/18. Reviewed pt medications/allergies/pharmacy. Pt has not had lab work (coronavirus). Pt doesn't have a way to check HR/BP at home but does have a scale. Pt also requested samples of Eliquis. Pt will have daughter call on Monday 4/13 before she gets to the office so that we can bring samples out and daughter will not have to come into the office.

## 2018-06-06 ENCOUNTER — Telehealth: Payer: Self-pay | Admitting: Cardiology

## 2018-06-06 NOTE — Telephone Encounter (Signed)
Patient never had labs done.I have printed them and put them with her medication samples.Her daughter needs to stop by and pick up.She has telehealth apt next week with Dr.McDowell

## 2018-06-06 NOTE — Progress Notes (Signed)
Virtual Visit via Telephone Note   This visit type was conducted due to national recommendations for restrictions regarding the COVID-19 Pandemic (e.g. social distancing) in an effort to limit this patient's exposure and mitigate transmission in our community.  Due to her co-morbid illnesses, this patient is at least at moderate risk for complications without adequate follow up.  This format is felt to be most appropriate for this patient at this time.  The patient did not have access to video technology/had technical difficulties with video requiring transitioning to audio format only (telephone).  All issues noted in this document were discussed and addressed.  No physical exam could be performed with this format.  Please refer to the patient's chart for her  consent to telehealth for [facility].   Evaluation Performed:  Follow-up visit  Date:  06/07/2018   ID:  [name], DOB [birth year], MRN [id number]  Patient Location: Home Provider Location: Home  PCP:  Octavio Graves, DO  Cardiologist:  Rozann Lesches, MD  Chief Complaint: Follow-up CAD and atrial fibrillation  History of Present Illness:    [name] is a 78 y.o. female last seen in January.  We spoke by phone today for follow-up, she did not have video access.  She tells me that since last visit she has not experienced any increasing angina symptoms or nitroglycerin use, also no palpitations, dizziness, or syncope.  She has arthritic stiffness which is limiting, but tries to do something around the house each day including working in her flowers outside.  Follow-up lab work from February is outlined below.  She does not report any spontaneous bleeding problems. CHADSVASC score is 6 and she continues on Eliquis.  I reviewed the remainder of her medications which are outlined below.  She reports no significant changes in cardiac regimen.  The patient does not have symptoms concerning for COVID-19 infection (fever,  chills, cough, or new shortness of breath).  She has been social distancing, stays at home.  Her daughter gets groceries for her when needed.   Past Medical History:  Diagnosis Date  . Anxiety   . Arthritis   . Atrial fibrillation ([place])   . Bulging lumbar disc   . Chronic lower back pain   . Coronary atherosclerosis of native coronary artery    DES LAD 02/2008, DES LAD 02/2015  . Depression   . Dyslipidemia   . Essential hypertension   . Facial numbness   . Frequent headaches   . Glaucoma   . Hypertension   . Paroxysmal atrial fibrillation [facility])    Diagnosed October 2019  . PSVT (paroxysmal supraventricular tachycardia) ([place])   . Refusal of blood transfusions as patient is Jehovah's Witness   . Type 2 diabetes mellitus (H[facility])    Borderline   Past Surgical History:  Procedure Laterality Date  . CARDIAC CATHETERIZATION N/A 02/23/2015   Procedure: Left Heart Cath and Coronary Angiography;  Surgeon: Sherren Mocha, MD;  Location: [place] [facility];  Service: Cardiovascular;  Laterality: N/A;  . CARDIAC CATHETERIZATION N/A 02/23/2015   Procedure: Coronary Stent Intervention;  Surgeon: Sherren Mocha, MD;  Location: [place] [facility];  Service: Cardiovascular;  Laterality: N/A;  . CARDIAC CATHETERIZATION N/A 01/04/2016   Procedure: Left Heart Cath and Coronary Angiography;  Surgeon: Peter M Martinique, MD;  Location: [place] [facility];  Service: Cardiovascular;  Laterality: N/A;  . CORONARY ANGIOPLASTY  02/23/2015  . CORONARY ANGIOPLASTY WITH STENT PLACEMENT  2009  . DILATION AND CURETTAGE  OF UTERUS    . TUBAL LIGATION       Current Meds  Medication Sig  . acetaminophen (TYLENOL) 650 MG CR tablet Take 1,300 mg by mouth every 8 (eight) hours as needed for pain.  Marland Kitchen apixaban (ELIQUIS) 5 MG TABS tablet Take 1 tablet (5 mg total) by mouth 2 (two) times daily.  . Ascorbic Acid (VITAMIN C) 500 MG tablet Take 500 mg by mouth daily.    . bimatoprost (LUMIGAN) 0.01 % SOLN Place 1 drop into both  eyes at bedtime.  . blood glucose meter kit and supplies Dispense based on patient and insurance preference. Use up to four times daily as directed. (FOR ICD-10 E10.9, E11.9).  . Calcium Carbonate Antacid (TUMS PO) Take 2 tablets by mouth 4 (four) times daily as needed (for acid reflux/indigestion).   . Cholecalciferol (VITAMIN D) 400 UNITS capsule Take 400 Units by mouth daily.    . Cyanocobalamin (VITAMIN B 12 PO) Take 1 tablet by mouth daily.  Marland Kitchen diltiazem (CARDIZEM CD) 180 MG 24 hr capsule Take 1 capsule (180 mg total) by mouth daily.  Marland Kitchen gabapentin (NEURONTIN) 300 MG capsule Take 2 capsules (600 mg total) by mouth 3 (three) times daily.  . Garlic 599 MG TABS Take 1 tablet by mouth daily.    Marland Kitchen HYDROcodone-acetaminophen (NORCO) 10-325 MG tablet Take 1 tablet by mouth 4 (four) times daily as needed. For pain.  . hydrocortisone 2.5 % cream Apply 2 (two) times daily topically.  Marland Kitchen ibuprofen (ADVIL,MOTRIN) 200 MG tablet Take 600 mg by mouth every 6 (six) hours as needed (for pain.).   Marland Kitchen MAGNESIUM CARBONATE PO Take 1 tablet by mouth daily.  . Menthol, Topical Analgesic, (BIOFREEZE) 4 % GEL Apply 1 application topically 4 (four) times daily as needed (for pain).   . metoprolol tartrate (LOPRESSOR) 50 MG tablet Take 2 tablets (100 mg total) by mouth 2 (two) times daily.  . nitroGLYCERIN (NITROSTAT) 0.4 MG SL tablet Place 0.4 mg under the tongue every 5 (five) minutes as needed for chest pain.  . pantoprazole (PROTONIX) 40 MG tablet TAKE ONE (1) TABLET EACH DAY  . timolol (TIMOPTIC) 0.5 % ophthalmic solution Place 1 drop into both eyes daily.  . TURMERIC PO Take 1 tablet by mouth daily.  . VENTOLIN HFA 108 (90 Base) MCG/ACT inhaler Inhale 1-2 puffs into the lungs every 4 (four) hours as needed for shortness of breath.  . zolpidem (AMBIEN) 10 MG tablet Take 10 mg by mouth at bedtime as needed for sleep.      Allergies:   Patient has no known allergies.   Social History   Tobacco Use  . Smoking  status: Never Smoker  . Smokeless tobacco: Never Used  Substance Use Topics  . Alcohol use: No    Alcohol/week: 0.0 standard drinks  . Drug use: No     Family Hx: The patient's family history includes Cancer in her father; Other in her mother.  ROS:   Please see the history of present illness.    All other systems reviewed and are negative.   Prior CV studies:   The following studies were reviewed today:  Echocardiogram 12/18/2017: Study Conclusions  - Left ventricle: The cavity size was normal. Wall thickness was increased in a pattern of moderate LVH. Systolic function was vigorous. The estimated ejection fraction was in the range of 65% to 70%. Wall motion was normal; there were no regional wall motion abnormalities. The study is not technically sufficient to allow  evaluation of LV diastolic function. - Aortic valve: Mildly calcified annulus. Trileaflet; mildly thickened leaflets. Valve area (VTI): 3.26 cm^2. Valve area (Vmax): 3.2 cm^2. - Mitral valve: Mildly calcified annulus. Mildly thickened leaflets. - Left atrium: The atrium was mildly dilated. - Atrial septum: No defect or patent foramen ovale was identified. - Pulmonary arteries: Systolic pressure was mildly increased. PA peak pressure: 31 mm Hg (S).  Cardiac catheterization 01/04/2016:  The left ventricular systolic function is normal.  LV end diastolic pressure is moderately elevated.  The left ventricular ejection fraction is 55-65% by visual estimate.  Prox LAD-2 lesion, 0 %stenosed.  A drug eluting .  Prox LAD-1 lesion, 0 %stenosed.  A drug eluting .  [place] lesion, 30 %stenosed.  Prox Cx lesion, 30 %stenosed.  Mid RCA lesion, 25 %stenosed.  Mid LAD lesion, 40 %stenosed.  1. Nonobstructive CAD. The stents in the LAD are patent 2. Normal LV function 3. Elevated LVEDP  Labs/Other Tests and Data Reviewed:    EKG: I personally reviewed the tracing from 01/31/2018 which  showed normal sinus rhythm with PACs, nonspecific ST-T changes.  Recent Labs: 12/17/2017: Magnesium 2.0; TSH 1.422 04/02/2018: ALT 14; BUN 8; Creatinine, Ser 0.78; Hemoglobin 13.0; Platelets 171; Potassium 4.3; Sodium 141   Recent Lipid Panel Lab Results  Component Value Date/Time   CHOL 198 12/17/2017 09:43 AM   TRIG 154 (H) 12/17/2017 09:43 AM   HDL 40 (L) 12/17/2017 09:43 AM   CHOLHDL 5.0 12/17/2017 09:43 AM   LDLCALC 127 (H) 12/17/2017 09:43 AM    Wt Readings from Last 3 Encounters:  06/07/18 185 lb (83.9 kg)  04/02/18 179 lb (81.2 kg)  03/01/18 178 lb (80.7 kg)     Objective:    Vital Signs:  Ht 5' 5"  (1.651 m)   Wt 185 lb (83.9 kg)   LMP  (LMP Unknown)   BMI 30.79 kg/m    She has and old, nonfunctioning blood pressure cuff at home, unable to get blood pressure or heart rate check today. She answered questions spontaneously on the phone.  Voice tone and cadence were normal.   Not breathless speaking in full sentences.  ASSESSMENT & PLAN:    1.  Persistent atrial fibrillation with CHADSVASC score of 6.  She is asymptomatic in terms of palpitations.  Heart rate control has been adequate on AV nodal blockers and she is tolerating Eliquis without bleeding problems. I confirmed that she is no longer taking aspirin.  Lab work from February reviewed above.  We will obtain follow-up CBC and BMET in 3 months with clinical visit in 6 months.  2.  CAD status post DES to the LAD in 2010 and 2017.  Reports no active angina symptoms or nitroglycerin use.  Not on aspirin given concurrent use of Eliquis.  3.  History of statin intolerance.  Last LDL 127.  She has been hesitant to try different medications, we will continue to discuss this at her next follow-up visit.  COVID-19 Education: The signs and symptoms of COVID-19 were discussed with the patient and how to seek care for testing (follow up with PCP or arrange E-visit).  The importance of social distancing was discussed today.   Time:   Today, I have spent 7 minutes with the patient with telehealth technology discussing the above problems.     Medication Adjustments/Labs and Tests Ordered: Current medicines are reviewed at length with the patient today.  Concerns regarding medicines are outlined above.   Tests Ordered: CBC and BMET  in 3 months  Medication Changes: No orders of the defined types were placed in this encounter.   Disposition:  Follow up [place] office in 6 months.  Signed, Rozann Lesches, MD  06/07/2018 8:46 AM    [place]

## 2018-06-06 NOTE — Telephone Encounter (Signed)
Pt is wanting to know when she's to have her lab work done?  States no one called her to tell her when to go have it done so it's not complete.   Please give her a call.

## 2018-06-07 ENCOUNTER — Telehealth (INDEPENDENT_AMBULATORY_CARE_PROVIDER_SITE_OTHER): Payer: Medicare Other | Admitting: Cardiology

## 2018-06-07 ENCOUNTER — Encounter: Payer: Self-pay | Admitting: Cardiology

## 2018-06-07 VITALS — Ht 65.0 in | Wt 185.0 lb

## 2018-06-07 DIAGNOSIS — Z789 Other specified health status: Secondary | ICD-10-CM | POA: Diagnosis not present

## 2018-06-07 DIAGNOSIS — Z7189 Other specified counseling: Secondary | ICD-10-CM | POA: Diagnosis not present

## 2018-06-07 DIAGNOSIS — I480 Paroxysmal atrial fibrillation: Secondary | ICD-10-CM

## 2018-06-07 DIAGNOSIS — I25119 Atherosclerotic heart disease of native coronary artery with unspecified angina pectoris: Secondary | ICD-10-CM

## 2018-06-07 DIAGNOSIS — I10 Essential (primary) hypertension: Secondary | ICD-10-CM

## 2018-06-07 NOTE — Patient Instructions (Signed)
Medication Instructions: Your physician recommends that you continue on your current medications as directed. Please refer to the Current Medication list given to you today.   Labwork: None today, you have your lab slips already, repeat lab work in 6 months  Procedures/Testing: None today  Follow-Up: 6 months with Dr.Mcdowell in the [place] office  Any Additional Special Instructions Will Be Listed Below (If Applicable).     If you need a refill on your cardiac medications before your next appointment, please call your pharmacy.

## 2018-07-01 ENCOUNTER — Telehealth: Payer: Self-pay | Admitting: Cardiology

## 2018-07-01 NOTE — Telephone Encounter (Signed)
Called to notify pt that we do not currently have any Eliquis 5 mg samples. No answer, left voicemail to call back.

## 2018-07-01 NOTE — Telephone Encounter (Signed)
Asking for Eliquis samples  °

## 2018-08-05 ENCOUNTER — Telehealth: Payer: Self-pay | Admitting: Cardiology

## 2018-08-05 ENCOUNTER — Other Ambulatory Visit: Payer: Self-pay | Admitting: Cardiology

## 2018-08-05 MED ORDER — APIXABAN 5 MG PO TABS: 5.0000 mg | ORAL_TABLET | Freq: Two times a day (BID) | ORAL | 0 refills | Status: DC

## 2018-08-05 NOTE — Telephone Encounter (Signed)
Eliquis  Samples

## 2018-08-05 NOTE — Telephone Encounter (Signed)
Worried about mother having more nausea spells than normal.

## 2018-08-05 NOTE — Telephone Encounter (Signed)
Denies dizziness, chest pain, sob, palpitations. C/O nausea but no vomiting. Advised to contact PCP about nausea. Verbalized understanding.

## 2018-08-19 ENCOUNTER — Telehealth: Payer: Self-pay | Admitting: *Deleted

## 2018-08-19 NOTE — Telephone Encounter (Signed)
Reports intermittent funny feeling in back of neck going up into head, sweating, fluttering, sob, some chest discomfort, nausea. Denies dizziness. Patient says her a-fib is acting up and is requesting to be seen. Unable to check vitals at home. Message left with [facility] to request appointment. Patient advised she would be contacted with appointment information and if her symptoms got worse, to go to the ED for an evaluation. Verbalized understanding.

## 2018-08-19 NOTE — Telephone Encounter (Signed)
[facility] appointment scheduled for 1:30 pm tomorrow 08/20/2018 with [facility] Entr[facility] [address] off of [address]  Patient aware. Number given if she is unable to keep appointment and need to reschedule.

## 2018-08-20 ENCOUNTER — Ambulatory Visit (HOSPITAL_COMMUNITY)
Admission: RE | Admit: 2018-08-20 | Discharge: 2018-08-20 | Disposition: A | Discharge status: Home / Self Care | Payer: Medicare Other | Source: Ambulatory Visit | Attending: Physician Assistant | Admitting: Physician Assistant

## 2018-08-20 ENCOUNTER — Other Ambulatory Visit: Payer: Self-pay

## 2018-08-20 ENCOUNTER — Encounter (HOSPITAL_COMMUNITY): Payer: Self-pay | Admitting: Physician Assistant

## 2018-08-20 VITALS — BP 132/58 | HR 64 | Ht 65.0 in | Wt 182.0 lb

## 2018-08-20 DIAGNOSIS — R0789 Other chest pain: Secondary | ICD-10-CM

## 2018-08-20 DIAGNOSIS — E119 Type 2 diabetes mellitus without complications: Secondary | ICD-10-CM | POA: Diagnosis not present

## 2018-08-20 DIAGNOSIS — E669 Obesity, unspecified: Secondary | ICD-10-CM | POA: Diagnosis not present

## 2018-08-20 DIAGNOSIS — Z79899 Other long term (current) drug therapy: Secondary | ICD-10-CM | POA: Diagnosis not present

## 2018-08-20 DIAGNOSIS — Z6830 Body mass index (BMI) 30.0-30.9, adult: Secondary | ICD-10-CM | POA: Insufficient documentation

## 2018-08-20 DIAGNOSIS — I10 Essential (primary) hypertension: Secondary | ICD-10-CM | POA: Insufficient documentation

## 2018-08-20 DIAGNOSIS — I480 Paroxysmal atrial fibrillation: Secondary | ICD-10-CM

## 2018-08-20 DIAGNOSIS — I2510 Atherosclerotic heart disease of native coronary artery without angina pectoris: Secondary | ICD-10-CM | POA: Insufficient documentation

## 2018-08-20 DIAGNOSIS — R0609 Other forms of dyspnea: Secondary | ICD-10-CM | POA: Diagnosis not present

## 2018-08-20 DIAGNOSIS — E785 Hyperlipidemia, unspecified: Secondary | ICD-10-CM | POA: Insufficient documentation

## 2018-08-20 DIAGNOSIS — Z7901 Long term (current) use of anticoagulants: Secondary | ICD-10-CM | POA: Diagnosis not present

## 2018-08-20 DIAGNOSIS — Z955 Presence of coronary angioplasty implant and graft: Secondary | ICD-10-CM | POA: Insufficient documentation

## 2018-08-20 NOTE — Patient Instructions (Signed)
Scheduling will be in touch with you to set up stress testing.

## 2018-08-20 NOTE — Progress Notes (Signed)
Primary Care Physician: Octavio Graves, DO Primary Cardiologist: Dr Domenic Polite Primary Electrophysiologist: none Referring Physician: Dr Reinaldo Raddle [name] is a 78 y.o. female with a history of CAD, HTN, DM, HLD, and persistent atrial fibrillation who presents for consultation in the [facility].  The patient was initially diagnosed with atrial fibrillation on 11/2017 after presenting with symptoms of palpitations and chest pain to ER and found to be in afib with RVR. She was discharged with Eliquis and rate control with plan to cardiovert on f/u but she had converted on her own. Recently, patient reports that she has had more episodes of SOB with exertion as well as neck and shoulder discomfort and nausea for the last month. No specific chest pain. She states that these symptoms are similar to the symptoms she had prior to her most recent stent in 2017. Her daughter who is with her today states that the patient cannot walk to the end of the short driveway without becoming very SOB. She states that her palpitations are not very frequent, only a couple in a month, and last for just a couple minutes and do not seem related to her other symptoms. She is in SR today.  Today, she denies symptoms of chest pain, orthopnea, PND, lower extremity edema, dizziness, presyncope, syncope, snoring, daytime somnolence, bleeding, or neurologic sequela. The patient is tolerating medications without difficulties and is otherwise without complaint today.    Atrial Fibrillation Risk Factors:  she does not have symptoms or diagnosis of sleep apnea. she does not have a history of rheumatic fever. she does not have a history of alcohol use. The patient does not have a history of early familial atrial fibrillation or other arrhythmias.  she has a BMI of Body mass index is 30.29 kg/m.Marland Kitchen Filed Weights   08/20/18 1410  Weight: 82.6 kg    Family History  Problem Relation Age of  Onset  . Other Mother        "natural causes"  . Cancer Father        unsure of type     Atrial Fibrillation Management history:  Previous antiarrhythmic drugs: none Previous cardioversions: none Previous ablations: none CHADS2VASC score: 6 Anticoagulation history: Eliquis   Past Medical History:  Diagnosis Date  . Anxiety   . Arthritis   . Atrial fibrillation ([place])   . Bulging lumbar disc   . Chronic lower back pain   . Coronary atherosclerosis of native coronary artery    DES LAD 02/2008, DES LAD 02/2015  . Depression   . Dyslipidemia   . Essential hypertension   . Facial numbness   . Frequent headaches   . Glaucoma   . Hypertension   . Paroxysmal atrial fibrillation [facility])    Diagnosed October 2019  . PSVT (paroxysmal supraventricular tachycardia) ([place])   . Refusal of blood transfusions as patient is Jehovah's Witness   . Type 2 diabetes mellitus (HCC)    Borderline   Past Surgical History:  Procedure Laterality Date  . CARDIAC CATHETERIZATION N/A 02/23/2015   Procedure: Left Heart Cath and Coronary Angiography;  Surgeon: Sherren Mocha, MD;  Location: [facility];  Service: Cardiovascular;  Laterality: N/A;  . CARDIAC CATHETERIZATION N/A 02/23/2015   Procedure: Coronary Stent Intervention;  Surgeon: Sherren Mocha, MD;  Location: [place] [facility];  Service: Cardiovascular;  Laterality: N/A;  . CARDIAC CATHETERIZATION N/A 01/04/2016   Procedure: Left Heart Cath and Coronary Angiography;  Surgeon: Collier Salina  M Martinique, MD;  Location: [place] [facility];  Service: Cardiovascular;  Laterality: N/A;  . CORONARY ANGIOPLASTY  02/23/2015  . CORONARY ANGIOPLASTY WITH STENT PLACEMENT  2009  . DILATION AND CURETTAGE OF UTERUS    . TUBAL LIGATION      Current Outpatient Medications  Medication Sig Dispense Refill  . apixaban (ELIQUIS) 5 MG TABS tablet Take 1 tablet (5 mg total) by mouth 2 (two) times daily. 56 tablet 0  . Ascorbic Acid (VITAMIN C) 500 MG tablet Take 500 mg  by mouth daily.      . bimatoprost (LUMIGAN) 0.01 % SOLN Place 1 drop into both eyes at bedtime.    . blood glucose meter kit and supplies Dispense based on patient and insurance preference. Use up to four times daily as directed. (FOR ICD-10 E10.9, E11.9). 1 each 0  . Calcium Carbonate Antacid (TUMS PO) Take 2 tablets by mouth 4 (four) times daily as needed (for acid reflux/indigestion).     . Cholecalciferol (VITAMIN D) 400 UNITS capsule Take 400 Units by mouth daily.      . Cyanocobalamin (VITAMIN B 12 PO) Take 1 tablet by mouth daily.    Marland Kitchen diltiazem (CARDIZEM CD) 180 MG 24 hr capsule Take 1 capsule (180 mg total) by mouth daily. 90 capsule 3  . Garlic 956 MG TABS Take 1 tablet by mouth daily.      Marland Kitchen HYDROcodone-acetaminophen (NORCO) 10-325 MG tablet Take 1 tablet by mouth 4 (four) times daily as needed. For pain.    . hydrocortisone 2.5 % cream Apply 2 (two) times daily topically. 30 g 0  . ibuprofen (ADVIL,MOTRIN) 200 MG tablet Take 600 mg by mouth every 6 (six) hours as needed (for pain.).     Marland Kitchen MAGNESIUM CARBONATE PO Take 1 tablet by mouth daily. 400 mg    . Menthol, Topical Analgesic, (BIOFREEZE) 4 % GEL Apply 1 application topically 4 (four) times daily as needed (for pain).     . metoprolol tartrate (LOPRESSOR) 50 MG tablet Take 2 tablets (100 mg total) by mouth 2 (two) times daily. 180 tablet 3  . pantoprazole (PROTONIX) 40 MG tablet TAKE ONE (1) TABLET EACH DAY 90 tablet 2  . timolol (TIMOPTIC) 0.5 % ophthalmic solution Place 1 drop into both eyes daily.    . TURMERIC PO Take 1 tablet by mouth daily.    . VENTOLIN HFA 108 (90 Base) MCG/ACT inhaler Inhale 1-2 puffs into the lungs every 4 (four) hours as needed for shortness of breath.    . zolpidem (AMBIEN) 10 MG tablet Take 10 mg by mouth at bedtime as needed for sleep.     Marland Kitchen acetaminophen (TYLENOL) 650 MG CR tablet Take 1,300 mg by mouth every 8 (eight) hours as needed for pain.    . nitroGLYCERIN (NITROSTAT) 0.4 MG SL tablet Place  0.4 mg under the tongue every 5 (five) minutes as needed for chest pain.     No current facility-administered medications for this encounter.     No Known Allergies  Social History   Socioeconomic History  . Marital status: Widowed    Spouse name: Not on file  . Number of children: 8  . Years of education: 10th grade  . Highest education level: Not on file  Occupational History  . Occupation: RETIRED  Social Needs  . Financial resource strain: Not on file  . Food insecurity    Worry: Not on file    Inability: Not on file  .  Transportation needs    Medical: Not on file    Non-medical: Not on file  Tobacco Use  . Smoking status: Never Smoker  . Smokeless tobacco: Never Used  Substance and Sexual Activity  . Alcohol use: No    Alcohol/week: 0.0 standard drinks  . Drug use: No  . Sexual activity: Never  Lifestyle  . Physical activity    Days per week: Not on file    Minutes per session: Not on file  . Stress: Not on file  Relationships  . Social Herbalist on phone: Not on file    Gets together: Not on file    Attends religious service: Not on file    Active member of club or organization: Not on file    Attends meetings of clubs or organizations: Not on file    Relationship status: Not on file  . Intimate partner violence    Fear of current or ex partner: Not on file    Emotionally abused: Not on file    Physically abused: Not on file    Forced sexual activity: Not on file  Other Topics Concern  . Not on file  Social History Narrative   Lives at home alone.   Right-handed.   No caffeine use.     ROS- All systems are reviewed and negative except as per the HPI above.  Physical Exam: Vitals:   08/20/18 1410  Weight: 82.6 kg  Height: 5' 5" (1.651 m)    GEN- The patient is well appearing elderly female, alert and oriented x 3 today.   Head- normocephalic, atraumatic Eyes-  Sclera clear, conjunctiva pink Ears- hearing intact Oropharynx- clear  Neck- supple  Lungs- Clear to ausculation bilaterally, normal work of breathing Heart- Regular rate and rhythm, no murmurs, rubs or gallops  GI- soft, NT, ND, + BS Extremities- no clubbing, cyanosis. Trace edema MS- no significant deformity or atrophy Skin- no rash or lesion Psych- euthymic mood, full affect Neuro- strength and sensation are intact  Wt Readings from Last 3 Encounters:  08/20/18 82.6 kg  06/07/18 83.9 kg  04/02/18 81.2 kg    EKG today demonstrates SR Hr 64, PAC, PR 184, QRS 68, QTc 447  Echo 12/18/17 demonstrated  - Left ventricle: The cavity size was normal. Wall thickness was   increased in a pattern of moderate LVH. Systolic function was   vigorous. The estimated ejection fraction was in the range of 65%   to 70%. Wall motion was normal; there were no regional wall   motion abnormalities. The study is not technically sufficient to   allow evaluation of LV diastolic function. - Aortic valve: Mildly calcified annulus. Trileaflet; mildly   thickened leaflets. Valve area (VTI): 3.26 cm^2. Valve area   (Vmax): 3.2 cm^2. - Mitral valve: Mildly calcified annulus. Mildly thickened leaflets   . - Left atrium: The atrium was mildly dilated. - Atrial septum: No defect or patent foramen ovale was identified. - Pulmonary arteries: Systolic pressure was mildly increased. PA   peak pressure: 31 mm Hg (S).   Epic records are reviewed at length today   Assessment and Plan: 1. Paroxysmal atrial fibrillation The patient has paroxysmal atrial fibrillation.   Patient is in [place] today. Palpitations burden appears low. SOB could be due to afib considering her initial presentation symptoms but would like to r/o ischemia given her history.  General education about afib provided and questions answered.  Would avoid class IC with h/o CAD. Continue  Eliquis 5 mg BID  This patients CHA2DS2-VASc Score and unadjusted Ischemic Stroke Rate (% per year) is equal to 9.7 % stroke  rate/year from a score of 6  Above score calculated as 1 point each if present [CHF, HTN, DM, Vascular=MI/PAD/Aortic Plaque, Age if 65-74, or Female] Above score calculated as 2 points each if present [Age > 75, or Stroke/TIA/TE]  2. Dyspnea on exertion Patient reports significant worsening of dyspnea on exertion over the last month. Could be anginal equivalent.  Will obtain stress myoview to evaluate for ischemia. ER precautions given.  2. Obesity Body mass index is 30.29 kg/m. Lifestyle modification was discussed at length including regular exercise and weight reduction.  3. CAD S/p DES to LAD 2010 and 2017 Plans as above.  4. HTN Stable, no changes today.   Follow up in AF clinic in one month.   [facility] [address] [place], [place] [address] [phone] 08/20/2018 2:14 PM

## 2018-08-26 ENCOUNTER — Telehealth (HOSPITAL_COMMUNITY): Payer: Self-pay

## 2018-08-26 NOTE — Telephone Encounter (Signed)
Spoke with the patient, instructions given and she stated that she would be here. Asked to call back with any questions. [name] EMTP

## 2018-08-29 ENCOUNTER — Other Ambulatory Visit: Payer: Self-pay

## 2018-08-29 ENCOUNTER — Encounter (HOSPITAL_COMMUNITY): Payer: Self-pay

## 2018-08-29 ENCOUNTER — Ambulatory Visit (HOSPITAL_COMMUNITY): Payer: Medicare Other | Attending: Physician Assistant

## 2018-08-29 DIAGNOSIS — R0789 Other chest pain: Secondary | ICD-10-CM

## 2018-08-29 LAB — MYOCARDIAL PERFUSION IMAGING
LV dias vol: 61 mL (ref 46–106)
LV sys vol: 23 mL
Peak HR: 80 {beats}/min
Rest HR: 62 {beats}/min
SDS: 3
SRS: 1
SSS: 4
TID: 1.05

## 2018-08-29 MED ORDER — TECHNETIUM TC 99M TETROFOSMIN IV KIT
9.7000 | PACK | Freq: Once | INTRAVENOUS | Status: AC | PRN
  Administered 2018-08-29: 9.7 via INTRAVENOUS
  Filled 2018-08-29: qty 10

## 2018-08-29 MED ORDER — TECHNETIUM TC 99M TETROFOSMIN IV KIT
32.6000 | PACK | Freq: Once | INTRAVENOUS | Status: AC | PRN
  Administered 2018-08-29: 32.6 via INTRAVENOUS
  Filled 2018-08-29: qty 33

## 2018-08-29 MED ORDER — REGADENOSON 0.4 MG/5ML IV SOLN
0.4000 mg | Freq: Once | INTRAVENOUS | Status: AC
  Administered 2018-08-29: 0.4 mg via INTRAVENOUS

## 2018-08-30 ENCOUNTER — Encounter (HOSPITAL_COMMUNITY): Payer: Self-pay | Admitting: *Deleted

## 2018-08-30 ENCOUNTER — Other Ambulatory Visit: Payer: Self-pay | Admitting: Cardiology

## 2018-09-16 ENCOUNTER — Telehealth: Payer: Self-pay | Admitting: Cardiology

## 2018-09-16 NOTE — Telephone Encounter (Signed)
Patient asking what to do about blood thinner. She is having teeth extracted

## 2018-09-16 NOTE — Telephone Encounter (Signed)
Patient informed to have dentist office fax over request r/e medications to be held and specific procedure being done. Verbalized understanding.

## 2018-09-24 ENCOUNTER — Ambulatory Visit (HOSPITAL_COMMUNITY)
Admission: RE | Admit: 2018-09-24 | Discharge: 2018-09-24 | Disposition: A | Discharge status: Home / Self Care | Payer: Medicare Other | Source: Ambulatory Visit | Attending: Physician Assistant | Admitting: Physician Assistant

## 2018-09-24 ENCOUNTER — Encounter (HOSPITAL_COMMUNITY): Payer: Self-pay | Admitting: Physician Assistant

## 2018-09-24 ENCOUNTER — Other Ambulatory Visit: Payer: Self-pay

## 2018-09-24 VITALS — BP 154/62 | HR 68 | Ht 65.0 in | Wt 179.0 lb

## 2018-09-24 DIAGNOSIS — H409 Unspecified glaucoma: Secondary | ICD-10-CM | POA: Diagnosis not present

## 2018-09-24 DIAGNOSIS — R0609 Other forms of dyspnea: Secondary | ICD-10-CM | POA: Insufficient documentation

## 2018-09-24 DIAGNOSIS — I480 Paroxysmal atrial fibrillation: Secondary | ICD-10-CM

## 2018-09-24 DIAGNOSIS — I491 Atrial premature depolarization: Secondary | ICD-10-CM | POA: Insufficient documentation

## 2018-09-24 DIAGNOSIS — E669 Obesity, unspecified: Secondary | ICD-10-CM | POA: Diagnosis not present

## 2018-09-24 DIAGNOSIS — Z6829 Body mass index (BMI) 29.0-29.9, adult: Secondary | ICD-10-CM | POA: Insufficient documentation

## 2018-09-24 DIAGNOSIS — K219 Gastro-esophageal reflux disease without esophagitis: Secondary | ICD-10-CM | POA: Diagnosis not present

## 2018-09-24 DIAGNOSIS — M1990 Unspecified osteoarthritis, unspecified site: Secondary | ICD-10-CM | POA: Insufficient documentation

## 2018-09-24 DIAGNOSIS — I10 Essential (primary) hypertension: Secondary | ICD-10-CM | POA: Insufficient documentation

## 2018-09-24 DIAGNOSIS — Z7901 Long term (current) use of anticoagulants: Secondary | ICD-10-CM | POA: Insufficient documentation

## 2018-09-24 DIAGNOSIS — E119 Type 2 diabetes mellitus without complications: Secondary | ICD-10-CM | POA: Insufficient documentation

## 2018-09-24 DIAGNOSIS — G8929 Other chronic pain: Secondary | ICD-10-CM | POA: Insufficient documentation

## 2018-09-24 DIAGNOSIS — Z809 Family history of malignant neoplasm, unspecified: Secondary | ICD-10-CM | POA: Diagnosis not present

## 2018-09-24 DIAGNOSIS — I2510 Atherosclerotic heart disease of native coronary artery without angina pectoris: Secondary | ICD-10-CM | POA: Diagnosis not present

## 2018-09-24 DIAGNOSIS — I471 Supraventricular tachycardia: Secondary | ICD-10-CM | POA: Insufficient documentation

## 2018-09-24 DIAGNOSIS — E785 Hyperlipidemia, unspecified: Secondary | ICD-10-CM | POA: Insufficient documentation

## 2018-09-24 DIAGNOSIS — Z79899 Other long term (current) drug therapy: Secondary | ICD-10-CM | POA: Insufficient documentation

## 2018-09-24 NOTE — Progress Notes (Signed)
Primary Care Physician: Octavio Graves, DO (Inactive) Primary Cardiologist: Dr Domenic Polite Primary Electrophysiologist: none Referring Physician: Dr Reinaldo Raddle [name] is a 78 y.o. female with a history of CAD, HTN, DM, HLD, and persistent atrial fibrillation who presents for consultation in the [facility].  The patient was initially diagnosed with atrial fibrillation on 11/2017 after presenting with symptoms of palpitations and chest pain to ER and found to be in afib with RVR. She was discharged with Eliquis and rate control with plan to cardiovert on f/u but she had converted on her own. Recently, patient reports that she has had more episodes of SOB with exertion as well as neck and shoulder discomfort and nausea for the last month. No specific chest pain. She states that these symptoms are similar to the symptoms she had prior to her most recent stent in 2017. Her daughter who is with her today states that the patient cannot walk to the end of the short driveway without becoming very SOB. She states that her palpitations are not very frequent, only a couple in a month, and last for just a couple minutes and do not seem related to her other symptoms.   On follow up today, patient reports that she remains fatigued with dyspnea on exertion, although mildly improved. Her stress test showed no ischemia with normal EF. Her symptoms have been constant. She is in SR today.   Today, she denies symptoms of chest pain, orthopnea, PND, lower extremity edema, dizziness, presyncope, syncope, snoring, daytime somnolence, bleeding, or neurologic sequela. The patient is tolerating medications without difficulties and is otherwise without complaint today.    Atrial Fibrillation Risk Factors:  she does not have symptoms or diagnosis of sleep apnea. she does not have a history of rheumatic fever. she does not have a history of alcohol use. The patient does not have a history  of early familial atrial fibrillation or other arrhythmias.  she has a BMI of Body mass index is 29.79 kg/m.Marland Kitchen Filed Weights   09/24/18 0922  Weight: 81.2 kg    Family History  Problem Relation Age of Onset  . Other Mother        "natural causes"  . Cancer Father        unsure of type     Atrial Fibrillation Management history:  Previous antiarrhythmic drugs: none Previous cardioversions: none Previous ablations: none CHADS2VASC score: 6 Anticoagulation history: Eliquis   Past Medical History:  Diagnosis Date  . Anxiety   . Arthritis   . Atrial fibrillation ([place])   . Bulging lumbar disc   . Chronic lower back pain   . Coronary atherosclerosis of native coronary artery    DES LAD 02/2008, DES LAD 02/2015  . Depression   . Dyslipidemia   . Essential hypertension   . Facial numbness   . Frequent headaches   . Glaucoma   . Hypertension   . Paroxysmal atrial fibrillation [facility])    Diagnosed October 2019  . PSVT (paroxysmal supraventricular tachycardia) ([place])   . Refusal of blood transfusions as patient is Jehovah's Witness   . Type 2 diabetes mellitus (HCC)    Borderline   Past Surgical History:  Procedure Laterality Date  . CARDIAC CATHETERIZATION N/A 02/23/2015   Procedure: Left Heart Cath and Coronary Angiography;  Surgeon: Sherren Mocha, MD;  Location: [place] [facility];  Service: Cardiovascular;  Laterality: N/A;  . CARDIAC CATHETERIZATION N/A 02/23/2015   Procedure: Coronary Stent Intervention;  Surgeon: Sherren Mocha, MD;  Location: [place] [facility];  Service: Cardiovascular;  Laterality: N/A;  . CARDIAC CATHETERIZATION N/A 01/04/2016   Procedure: Left Heart Cath and Coronary Angiography;  Surgeon: Peter M Martinique, MD;  Location: [place] [facility];  Service: Cardiovascular;  Laterality: N/A;  . CORONARY ANGIOPLASTY  02/23/2015  . CORONARY ANGIOPLASTY WITH STENT PLACEMENT  2009  . DILATION AND CURETTAGE OF UTERUS    . TUBAL LIGATION      Current Outpatient  Medications  Medication Sig Dispense Refill  . acetaminophen (TYLENOL) 650 MG CR tablet Take 1,300 mg by mouth every 8 (eight) hours as needed for pain.    . Ascorbic Acid (VITAMIN C) 500 MG tablet Take 500 mg by mouth daily.      . bimatoprost (LUMIGAN) 0.01 % SOLN Place 1 drop into both eyes at bedtime.    . blood glucose meter kit and supplies Dispense based on patient and insurance preference. Use up to four times daily as directed. (FOR ICD-10 E10.9, E11.9). 1 each 0  . Calcium Carbonate Antacid (TUMS PO) Take 2 tablets by mouth 4 (four) times daily as needed (for acid reflux/indigestion).     . Cholecalciferol (VITAMIN D) 400 UNITS capsule Take 400 Units by mouth daily.      . Cyanocobalamin (VITAMIN B 12 PO) Take 1 tablet by mouth daily.    Marland Kitchen diltiazem (CARDIZEM CD) 180 MG 24 hr capsule Take 1 capsule (180 mg total) by mouth daily. 90 capsule 3  . ELIQUIS 5 MG TABS tablet TAKE ONE TABLET BY MOUTH TWICE DAILY 60 tablet 3  . Garlic 423 MG TABS Take 1 tablet by mouth daily.      Marland Kitchen HYDROcodone-acetaminophen (NORCO) 10-325 MG tablet Take 1 tablet by mouth 4 (four) times daily as needed. For pain.    . hydrocortisone 2.5 % cream Apply 2 (two) times daily topically. 30 g 0  . ibuprofen (ADVIL,MOTRIN) 200 MG tablet Take 600 mg by mouth every 6 (six) hours as needed (for pain.).     Marland Kitchen MAGNESIUM CARBONATE PO Take 1 tablet by mouth daily. 400 mg    . Menthol, Topical Analgesic, (BIOFREEZE) 4 % GEL Apply 1 application topically 4 (four) times daily as needed (for pain).     . metoprolol tartrate (LOPRESSOR) 50 MG tablet Take 2 tablets (100 mg total) by mouth 2 (two) times daily. 180 tablet 3  . pantoprazole (PROTONIX) 40 MG tablet TAKE ONE (1) TABLET EACH DAY 90 tablet 2  . promethazine (PHENERGAN) 25 MG tablet 1 tablet as needed.    . timolol (TIMOPTIC) 0.5 % ophthalmic solution Place 1 drop into both eyes daily.    . TURMERIC PO Take 1 tablet by mouth daily.    . VENTOLIN HFA 108 (90 Base) MCG/ACT  inhaler Inhale 1-2 puffs into the lungs every 4 (four) hours as needed for shortness of breath.    . zolpidem (AMBIEN) 10 MG tablet Take 10 mg by mouth at bedtime as needed for sleep.     . nitroGLYCERIN (NITROSTAT) 0.4 MG SL tablet Place 0.4 mg under the tongue every 5 (five) minutes as needed for chest pain.     No current facility-administered medications for this encounter.     No Known Allergies  Social History   Socioeconomic History  . Marital status: Widowed    Spouse name: Not on file  . Number of children: 8  . Years of education: 10th grade  . Highest education level:  Not on file  Occupational History  . Occupation: RETIRED  Social Needs  . Financial resource strain: Not on file  . Food insecurity    Worry: Not on file    Inability: Not on file  . Transportation needs    Medical: Not on file    Non-medical: Not on file  Tobacco Use  . Smoking status: Never Smoker  . Smokeless tobacco: Never Used  Substance and Sexual Activity  . Alcohol use: No    Alcohol/week: 0.0 standard drinks  . Drug use: No  . Sexual activity: Never  Lifestyle  . Physical activity    Days per week: Not on file    Minutes per session: Not on file  . Stress: Not on file  Relationships  . Social Herbalist on phone: Not on file    Gets together: Not on file    Attends religious service: Not on file    Active member of club or organization: Not on file    Attends meetings of clubs or organizations: Not on file    Relationship status: Not on file  . Intimate partner violence    Fear of current or ex partner: Not on file    Emotionally abused: Not on file    Physically abused: Not on file    Forced sexual activity: Not on file  Other Topics Concern  . Not on file  Social History Narrative   Lives at home alone.   Right-handed.   No caffeine use.     ROS- All systems are reviewed and negative except as per the HPI above.  Physical Exam: Vitals:   09/24/18 0922   BP: (!) 154/62  Pulse: 68  Weight: 81.2 kg  Height: 5' 5"  (1.651 m)    GEN- The patient is well appearing elderly female, alert and oriented x 3 today.   HEENT-head normocephalic, atraumatic, sclera clear, conjunctiva pink, hearing intact, trachea midline. Lungs- Clear to ausculation bilaterally, normal work of breathing Heart- Regular rate and rhythm, no murmurs, rubs or gallops  GI- soft, NT, ND, + BS Extremities- no clubbing, cyanosis, or edema MS- no significant deformity or atrophy Skin- no rash or lesion Psych- euthymic mood, full affect Neuro- strength and sensation are intact   Wt Readings from Last 3 Encounters:  09/24/18 81.2 kg  08/20/18 82.6 kg  06/07/18 83.9 kg    EKG today demonstrates SR HR 68, PR 190, QRS 68, QTc 425  Echo 12/18/17 demonstrated  - Left ventricle: The cavity size was normal. Wall thickness was   increased in a pattern of moderate LVH. Systolic function was   vigorous. The estimated ejection fraction was in the range of 65%   to 70%. Wall motion was normal; there were no regional wall   motion abnormalities. The study is not technically sufficient to   allow evaluation of LV diastolic function. - Aortic valve: Mildly calcified annulus. Trileaflet; mildly   thickened leaflets. Valve area (VTI): 3.26 cm^2. Valve area   (Vmax): 3.2 cm^2. - Mitral valve: Mildly calcified annulus. Mildly thickened leaflets   . - Left atrium: The atrium was mildly dilated. - Atrial septum: No defect or patent foramen ovale was identified. - Pulmonary arteries: Systolic pressure was mildly increased. PA   peak pressure: 31 mm Hg (S).   Epic records are reviewed at length today   Assessment and Plan: 1. Paroxysmal atrial fibrillation Patient is in SR today.   Will get 3 day Zio patch  to evaluate arrhythmia burden and see if it correlates to her symptoms of fatigue and SOB. Would avoid class IC with h/o CAD. Continue Eliquis 5 mg BID  This patients  CHA2DS2-VASc Score and unadjusted Ischemic Stroke Rate (% per year) is equal to 9.7 % stroke rate/year from a score of 6  Above score calculated as 1 point each if present [CHF, HTN, DM, Vascular=MI/PAD/Aortic Plaque, Age if 65-74, or Female] Above score calculated as 2 points each if present [Age > 75, or Stroke/TIA/TE]  2. Dyspnea on exertion Recent stress test unremarkable. See plans above.  2. Obesity Body mass index is 29.79 kg/m. Lifestyle modification was discussed and encouraged including regular physical activity and weight reduction.  3. CAD S/p DES to LAD 2010 and 2017 Myoview 08/29/18 showed no ischemia, EF 62%  4. HTN Mildly elevated. No changes today. Reassess at follow up.   Follow up with Dr Domenic Polite as scheduled.   [facility] [address] [place], [place] [address] [phone] 09/24/2018 10:47 AM

## 2018-10-25 ENCOUNTER — Other Ambulatory Visit: Payer: Self-pay | Admitting: *Deleted

## 2018-10-25 DIAGNOSIS — Z20822 Contact with and (suspected) exposure to covid-19: Secondary | ICD-10-CM

## 2018-10-26 LAB — NOVEL CORONAVIRUS, NAA: SARS-CoV-2, NAA: NOT DETECTED

## 2018-11-11 ENCOUNTER — Telehealth: Payer: Self-pay | Admitting: Cardiology

## 2018-11-11 ENCOUNTER — Encounter (HOSPITAL_COMMUNITY): Payer: Self-pay | Admitting: Emergency Medicine

## 2018-11-11 ENCOUNTER — Emergency Department (HOSPITAL_COMMUNITY)
Admission: EM | Admit: 2018-11-11 | Discharge: 2018-11-11 | Disposition: A | Discharge status: Home / Self Care | Payer: Medicare Other | Attending: Emergency Medicine | Admitting: Emergency Medicine

## 2018-11-11 ENCOUNTER — Telehealth: Payer: Medicare Other | Admitting: Physician Assistant

## 2018-11-11 ENCOUNTER — Other Ambulatory Visit: Payer: Self-pay

## 2018-11-11 ENCOUNTER — Emergency Department (HOSPITAL_COMMUNITY): Payer: Medicare Other

## 2018-11-11 DIAGNOSIS — E119 Type 2 diabetes mellitus without complications: Secondary | ICD-10-CM | POA: Insufficient documentation

## 2018-11-11 DIAGNOSIS — E782 Mixed hyperlipidemia: Secondary | ICD-10-CM

## 2018-11-11 DIAGNOSIS — Z79899 Other long term (current) drug therapy: Secondary | ICD-10-CM | POA: Diagnosis not present

## 2018-11-11 DIAGNOSIS — I25119 Atherosclerotic heart disease of native coronary artery with unspecified angina pectoris: Secondary | ICD-10-CM | POA: Diagnosis not present

## 2018-11-11 DIAGNOSIS — I4819 Other persistent atrial fibrillation: Secondary | ICD-10-CM | POA: Diagnosis not present

## 2018-11-11 DIAGNOSIS — I4891 Unspecified atrial fibrillation: Secondary | ICD-10-CM | POA: Insufficient documentation

## 2018-11-11 DIAGNOSIS — I10 Essential (primary) hypertension: Secondary | ICD-10-CM | POA: Diagnosis not present

## 2018-11-11 DIAGNOSIS — R0602 Shortness of breath: Secondary | ICD-10-CM | POA: Diagnosis present

## 2018-11-11 LAB — CBC
HCT: 35.6 % — ABNORMAL LOW (ref 36.0–46.0)
Hemoglobin: 11.9 g/dL — ABNORMAL LOW (ref 12.0–15.0)
MCH: 30.9 pg (ref 26.0–34.0)
MCHC: 33.4 g/dL (ref 30.0–36.0)
MCV: 92.5 fL (ref 80.0–100.0)
Platelets: 197 10*3/uL (ref 150–400)
RBC: 3.85 MIL/uL — ABNORMAL LOW (ref 3.87–5.11)
RDW: 13.2 % (ref 11.5–15.5)
WBC: 6.4 10*3/uL (ref 4.0–10.5)
nRBC: 0 % (ref 0.0–0.2)

## 2018-11-11 LAB — BASIC METABOLIC PANEL
Anion gap: 9 (ref 5–15)
BUN: 11 mg/dL (ref 8–23)
CO2: 27 mmol/L (ref 22–32)
Calcium: 9.5 mg/dL (ref 8.9–10.3)
Chloride: 102 mmol/L (ref 98–111)
Creatinine, Ser: 0.9 mg/dL (ref 0.44–1.00)
GFR calc Af Amer: >60 mL/min (ref >60)
GFR calc non Af Amer: >60 mL/min (ref >60)
Glucose, Bld: 227 mg/dL — ABNORMAL HIGH (ref 70–99)
Potassium: 3.9 mmol/L (ref 3.5–5.1)
Sodium: 138 mmol/L (ref 135–145)

## 2018-11-11 LAB — TROPONIN I (HIGH SENSITIVITY)
Troponin I (High Sensitivity): 5 ng/L (ref <18)
Troponin I (High Sensitivity): 7 ng/L (ref <18)

## 2018-11-11 MED ORDER — PROPOFOL 10 MG/ML IV BOLUS
0.5000 mg/kg | Freq: Once | INTRAVENOUS | Status: DC
  Filled 2018-11-11: qty 20

## 2018-11-11 MED ORDER — SODIUM CHLORIDE 0.9% FLUSH: 3.0000 mL | Freq: Once | INTRAVENOUS | Status: DC

## 2018-11-11 MED ORDER — PROPOFOL 10 MG/ML IV BOLUS
INTRAVENOUS | Status: AC | PRN
  Administered 2018-11-11 (×2): 40 mg via INTRAVENOUS

## 2018-11-11 NOTE — ED Provider Notes (Signed)
[facility] EMERGENCY DEPARTMENT Provider Note   CSN: [id number] Arrival date & time: 11/11/18  1242     History   Chief Complaint Chief Complaint  Patient presents with  . Chest Pain  . Atrial Fibrillation    HPI [name] is a 78 y.o. female.     Patient is a 78 year old female with past medical history of coronary artery disease with stent x2, paroxysmal A. fib, hypertension, and type 2 diabetes.  She presents today for evaluation of shortness of breath, chest tightness, and palpitations for the past 3 days.  Patient describes shortness of breath with ambulation.  She has had difficulty walking to the mailbox and back due to this.  She denies any fevers, chills, or cough.  Patient's cardiologist is Dr. Domenic Polite.  Her last heart cath was November 2017 which showed no obstructive lesions.  She is anticoagulated with Eliquis and reports being compliant with this.  The history is provided by the patient.  Chest Pain Pain location:  Substernal area Pain quality: tightness   Pain radiates to:  Does not radiate Pain severity:  Moderate Onset quality:  Gradual Duration:  3 days Timing:  Constant Progression:  Worsening Chronicity:  Recurrent Relieved by:  Rest Worsened by:  Exertion Ineffective treatments:  None tried   Past Medical History:  Diagnosis Date  . Anxiety   . Arthritis   . Atrial fibrillation ([place])   . Bulging lumbar disc   . Chronic lower back pain   . Coronary atherosclerosis of native coronary artery    DES LAD 02/2008, DES LAD 02/2015  . Depression   . Dyslipidemia   . Essential hypertension   . Facial numbness   . Frequent headaches   . Glaucoma   . Hypertension   . Paroxysmal atrial fibrillation [facility])    Diagnosed October 2019  . PSVT (paroxysmal supraventricular tachycardia) ([place])   . Refusal of blood transfusions as patient is Jehovah's Witness   . Type 2 diabetes mellitus ([place])    Borderline    Patient Active  Problem List   Diagnosis Date Noted  . Numbness 04/02/2018  . Numbness of tongue 04/02/2018  . Neck pain on right side 04/02/2018  . Type 2 diabetes mellitus without complication ([place]) 72/53/6644  . Atrial fibrillation with rapid ventricular response ([place])   . Chest pain in adult   . Atrial fibrillation with RVR ([place]) 12/17/2017  . Accelerating angina ([place])   . Exertional angina ([facility]) 02/23/2015  . Chest pain 11/01/2012  . Mixed hyperlipidemia 11/12/2009  . Essential hypertension 11/12/2009  . Coronary atherosclerosis of native coronary artery 09/25/2008    Past Surgical History:  Procedure Laterality Date  . CARDIAC CATHETERIZATION N/A 02/23/2015   Procedure: Left Heart Cath and Coronary Angiography;  Surgeon: Sherren Mocha, MD;  Location: [facility] CV LAB;  Service: Cardiovascular;  Laterality: N/A;  . CARDIAC CATHETERIZATION N/A 02/23/2015   Procedure: Coronary Stent Intervention;  Surgeon: Sherren Mocha, MD;  Location: [facility] CV LAB;  Service: Cardiovascular;  Laterality: N/A;  . CARDIAC CATHETERIZATION N/A 01/04/2016   Procedure: Left Heart Cath and Coronary Angiography;  Surgeon: Peter M Martinique, MD;  Location: [facility] CV LAB;  Service: Cardiovascular;  Laterality: N/A;  . CORONARY ANGIOPLASTY  02/23/2015  . CORONARY ANGIOPLASTY WITH STENT PLACEMENT  2009  . DILATION AND CURETTAGE OF UTERUS    . TUBAL LIGATION       OB History   No obstetric history on file.  Home Medications    Prior to Admission medications   Medication Sig Start Date End Date Taking? Authorizing Provider  acetaminophen (TYLENOL) 650 MG CR tablet Take 1,300 mg by mouth every 8 (eight) hours as needed for pain.    [provider]  Ascorbic Acid (VITAMIN C) 500 MG tablet Take 500 mg by mouth daily.      [provider]  bimatoprost (LUMIGAN) 0.01 % SOLN Place 1 drop into both eyes at bedtime.    [provider]  blood glucose meter kit and supplies Dispense based  on patient and insurance preference. Use up to four times daily as directed. (FOR ICD-10 E10.9, E11.9). 12/21/17   Dhungel, Flonnie Overman, MD  Calcium Carbonate Antacid (TUMS PO) Take 2 tablets by mouth 4 (four) times daily as needed (for acid reflux/indigestion).     [provider]  Cholecalciferol (VITAMIN D) 400 UNITS capsule Take 400 Units by mouth daily.      [provider]  Cyanocobalamin (VITAMIN B 12 PO) Take 1 tablet by mouth daily.    [provider]  diltiazem (CARDIZEM CD) 180 MG 24 hr capsule Take 1 capsule (180 mg total) by mouth daily. 01/31/18   Strader, Fransisco Hertz, PA-C  ELIQUIS 5 MG TABS tablet TAKE ONE TABLET BY MOUTH TWICE DAILY 08/30/18   Satira Sark, MD  Garlic 078 MG TABS Take 1 tablet by mouth daily.      [provider]  HYDROcodone-acetaminophen (NORCO) 10-325 MG tablet Take 1 tablet by mouth 4 (four) times daily as needed. For pain. 10/29/15   [provider]  hydrocortisone 2.5 % cream Apply 2 (two) times daily topically. 01/01/17   Lendon Colonel, NP  ibuprofen (ADVIL,MOTRIN) 200 MG tablet Take 600 mg by mouth every 6 (six) hours as needed (for pain.).     [provider]  MAGNESIUM CARBONATE PO Take 1 tablet by mouth daily. 400 mg    [provider]  Menthol, Topical Analgesic, (BIOFREEZE) 4 % GEL Apply 1 application topically 4 (four) times daily as needed (for pain).     [provider]  metoprolol tartrate (LOPRESSOR) 50 MG tablet Take 2 tablets (100 mg total) by mouth 2 (two) times daily. 01/31/18   Strader, Fransisco Hertz, PA-C  nitroGLYCERIN (NITROSTAT) 0.4 MG SL tablet Place 0.4 mg under the tongue every 5 (five) minutes as needed for chest pain.    [provider]  pantoprazole (PROTONIX) 40 MG tablet TAKE ONE (1) TABLET EACH DAY 10/24/17   Satira Sark, MD  promethazine (PHENERGAN) 25 MG tablet 1 tablet as needed. 08/08/18   [provider]  timolol (TIMOPTIC) 0.5 %  ophthalmic solution Place 1 drop into both eyes daily.    [provider]  TURMERIC PO Take 1 tablet by mouth daily.    [provider]  VENTOLIN HFA 108 (90 Base) MCG/ACT inhaler Inhale 1-2 puffs into the lungs every 4 (four) hours as needed for shortness of breath. 12/21/15   [provider]  zolpidem (AMBIEN) 10 MG tablet Take 10 mg by mouth at bedtime as needed for sleep.     [provider]    Family History Family History  Problem Relation Age of Onset  . Other Mother        "natural causes"  . Cancer Father        unsure of type    Social History Social History   Tobacco Use  . Smoking status: Never  Smoker  . Smokeless tobacco: Never Used  Substance Use Topics  . Alcohol use: No    Alcohol/week: 0.0 standard drinks  . Drug use: No     Allergies   Patient has no known allergies.   Review of Systems Review of Systems  All other systems reviewed and are negative.    Physical Exam Updated Vital Signs BP (!) 143/110   Pulse (!) 47   Temp 98.2 F (36.8 C)   Resp 16   Ht 5' 4"  (1.626 m)   Wt 83 kg   LMP  (LMP Unknown)   SpO2 99%   BMI 31.41 kg/m   Physical Exam Vitals signs and nursing note reviewed.  Constitutional:      General: She is not in acute distress.    Appearance: She is well-developed. She is not diaphoretic.  HENT:     Head: Normocephalic and atraumatic.  Neck:     Musculoskeletal: Normal range of motion and neck supple.  Cardiovascular:     Rate and Rhythm: Normal rate and regular rhythm.     Heart sounds: No murmur. No friction rub. No gallop.   Pulmonary:     Effort: Pulmonary effort is normal. No respiratory distress.     Breath sounds: Normal breath sounds. No wheezing.  Abdominal:     General: Bowel sounds are normal. There is no distension.     Palpations: Abdomen is soft.     Tenderness: There is no abdominal tenderness.  Musculoskeletal: Normal range of motion.     Right lower leg: She  exhibits no tenderness. Edema present.     Left lower leg: She exhibits no tenderness. Edema present.     Comments: There is trace edema of both lower extremities.  Skin:    General: Skin is warm and dry.  Neurological:     Mental Status: She is alert and oriented to person, place, and time.      ED Treatments / Results  Labs (all labs ordered are listed, but only abnormal results are displayed) Labs Reviewed  BASIC METABOLIC PANEL - Abnormal; Notable for the following components:      Result Value   Glucose, Bld 227 (*)    All other components within normal limits  CBC - Abnormal; Notable for the following components:   RBC 3.85 (*)    Hemoglobin 11.9 (*)    HCT 35.6 (*)    All other components within normal limits  TROPONIN I (HIGH SENSITIVITY)  TROPONIN I (HIGH SENSITIVITY)    EKG EKG Interpretation  Date/Time:  Monday November 11 2018 12:59:11 EDT Ventricular Rate:  134 PR Interval:    QRS Duration: 66 QT Interval:  278 QTC Calculation: 415 R Axis:   100 Text Interpretation:  Atrial fibrillation with rapid ventricular response Rightward axis Abnormal ECG Confirmed by Veryl Speak [phone]) on 11/11/2018 4:49:39 PM  ED ECG REPORT   Date: 11/11/2018  Rate: 56  Rhythm: sinus bradycardia  QRS Axis: normal  Intervals: normal  ST/T Wave abnormalities: normal  Conduction Disutrbances:occasional pac's  Narrative Interpretation:   Old EKG Reviewed: Conversion to Sinus Rhythm  I have personally reviewed the EKG tracing and agree with the computerized printout as noted.   Radiology Dg Chest 2 View  Result Date: 11/11/2018 CLINICAL DATA:  Onset chest pain and tightness 11/08/2018. EXAM: CHEST - 2 VIEW COMPARISON:  PA and lateral chest 05/12/2016. Single-view of the chest 12/17/2017. CT chest 11/01/2012. FINDINGS: The lungs are clear. Heart size is  upper normal. Aortic atherosclerosis noted. No pneumothorax or pleural fluid. No acute or focal bony abnormality.  IMPRESSION: No acute disease. Atherosclerosis. Electronically Signed   By: Inge Rise M.D.   On: 11/11/2018 16:06    Procedures .Sedation  Date/Time: 11/11/2018 7:39 PM Performed by: Veryl Speak, MD Authorized by: Veryl Speak, MD   Consent:    Consent obtained:  Verbal   Consent given by:  Patient   Risks discussed:  Allergic reaction, dysrhythmia, inadequate sedation, nausea, prolonged hypoxia resulting in organ damage, prolonged sedation necessitating reversal, respiratory compromise necessitating ventilatory assistance and intubation and vomiting   Alternatives discussed:  Analgesia without sedation, anxiolysis and regional anesthesia Universal protocol:    Procedure explained and questions answered to patient or proxy's satisfaction: yes     Relevant documents present and verified: yes     Test results available and properly labeled: yes     Imaging studies available: yes     Required blood products, implants, devices, and special equipment available: yes     Site/side marked: yes     Immediately prior to procedure a time out was called: yes     Patient identity confirmation method:  Verbally with patient, arm band and hospital-assigned identification number Indications:    Procedure performed:  Cardioversion   Procedure necessitating sedation performed by:  Physician performing sedation Pre-sedation assessment:    Time since last food or drink:  5 hours   ASA classification: class 1 - normal, healthy patient     Neck mobility: normal     Mouth opening:  3 or more finger widths   Thyromental distance:  4 finger widths   Mallampati score:  I - soft palate, uvula, fauces, pillars visible   Pre-sedation assessments completed and reviewed: airway patency, cardiovascular function, hydration status, mental status, nausea/vomiting, pain level, respiratory function and temperature   Immediate pre-procedure details:    Reassessment: Patient reassessed immediately prior to  procedure     Reviewed: vital signs, relevant labs/tests and NPO status     Verified: bag valve mask available, emergency equipment available, intubation equipment available, IV patency confirmed, oxygen available and suction available   Procedure details (see MAR for exact dosages):    Preoxygenation:  Nasal cannula   Sedation:  Propofol   Intra-procedure monitoring:  Blood pressure monitoring, cardiac monitor, continuous pulse oximetry, frequent LOC assessments, frequent vital sign checks and continuous capnometry   Intra-procedure events: none     Total Provider sedation time (minutes):  15 Post-procedure details:    Post-sedation assessment completed:  11/11/2018 7:15 PM   Attendance: Constant attendance by certified staff until patient recovered     Recovery: Patient returned to pre-procedure baseline     Post-sedation assessments completed and reviewed: airway patency, cardiovascular function, hydration status, mental status, nausea/vomiting, pain level, respiratory function and temperature     Patient is stable for discharge or admission: yes     Patient tolerance:  Tolerated well, no immediate complications Comments:     Patient tolerated procedure well with successful conversion to sinus rhythm .Cardioversion  Date/Time: 11/11/2018 7:40 PM Performed by: Veryl Speak, MD Authorized by: Veryl Speak, MD   Consent:    Consent obtained:  Verbal and written   Consent given by:  Patient   Risks discussed:  Induced arrhythmia and pain   Alternatives discussed:  No treatment and rate-control medication Pre-procedure details:    Cardioversion basis:  Emergent   Rhythm:  Atrial fibrillation   Electrode placement:  Anterior-posterior  Patient sedated: Yes. Refer to sedation procedure documentation for details of sedation.  Attempt one:    Cardioversion mode:  Synchronous   Waveform:  Monophasic   Shock (Joules):  120   Shock outcome:  Conversion to normal sinus rhythm  Post-procedure details:    Patient status:  Alert   Patient tolerance of procedure:  Tolerated well, no immediate complications   (including critical care time)  Medications Ordered in ED Medications  sodium chloride flush (NS) 0.9 % injection 3 mL (has no administration in time range)     Initial Impression / Assessment and Plan / ED Course  I have reviewed the triage vital signs and the nursing notes.  Pertinent labs & imaging results that were available during my care of the patient were reviewed by me and considered in my medical decision making (see chart for details).  Patient presents with a several day history of fatigue, tightness in her chest, palpitations, and dyspnea on exertion.  Here to be in atrial fibrillation with rapid ventricular response.  Her work-up is otherwise unremarkable including troponin and laboratory studies.  In consultation with Dr. Acie Fredrickson from cardiology, the decision was made to proceed with cardioversion.  This was performed under conscious sedation using propofol.  Patient tolerated the procedure well and was converted to a sinus rhythm.  She has been observed for approximately an additional hour following cardioversion.  She is ambulatory and feeling much better.  She has remained in a sinus rhythm and I believe appropriate for discharge.  Patient to follow-up with her cardiologist later this week and return as needed for any problems.  CRITICAL CARE Performed by: Veryl Speak Total critical care time: 45 minutes Critical care time was exclusive of separately billable procedures and treating other patients. Critical care was necessary to treat or prevent imminent or life-threatening deterioration. Critical care was time spent personally by me on the following activities: development of treatment plan with patient and/or surrogate as well as nursing, discussions with consultants, evaluation of patient's response to treatment, examination of patient,  obtaining history from patient or surrogate, ordering and performing treatments and interventions, ordering and review of laboratory studies, ordering and review of radiographic studies, pulse oximetry and re-evaluation of patient's condition.   Final Clinical Impressions(s) / ED Diagnoses   Final diagnoses:  None    ED Discharge Orders    None       Veryl Speak, MD 11/11/18 2034

## 2018-11-11 NOTE — Telephone Encounter (Signed)
Patient called stated that she has had a bad weekend.  States that she is having tightness in chest, BP up and down , nausea started yesterday.   [phone])   BP   115/71  p 78    Saturday 11-09-2018          105/65 p 71          107/78    p 120           103/74   p  80    136/88 p 95               Sunday  11-10-2018  115/112  p 107                  154/96  p 121           Monday 11-11-2018      11 9/74 p 118

## 2018-11-11 NOTE — Telephone Encounter (Signed)
Thank you for the update.  [name], ER might be the best option in case cardiac catheterization is needed.

## 2018-11-11 NOTE — Consult Note (Addendum)
Cardiology Consultation:   Patient ID: [name] MRN: [id number]; DOB: December 16, 1940  Admit date: 11/11/2018 Date of Consult: 11/11/2018  Primary Care Provider: Scotty Court, DO Primary Cardiologist: Rozann Lesches, MD  Primary Electrophysiologist:  None    Patient Profile:   [name] is a 78 y.o. female with a hx of coronary artery disease, hypertension, diabetes mellitus, hyperlipidemia and persistent atrial fibrillation.  She has been seen in the atrial fibrillation clinic.  Who is being seen today for the evaluation of atrial fibrillation at the request of Dr. Stark Jock.  History of Present Illness:   [name] has a history of coronary artery disease, hypertension, diabetes mellitus, hyperlipidemia and persistent atrial fibrillation.  She is converted on her own 1 time back into sinus rhythm.  She now presents with persistent atrial fibrillation.  She was last seen in the A. fib clinic on August 4 and was in normal sinus rhythm at that time.  She is on Eliquis 5 mg twice a day.  She reports 1-2 weeks of palpitations, chest tightness, and increased dyspnea.  Symptoms worsened this past Friday and she had a rough weekend.   Took a  SL "NTG this am at the recommendation of our nurse which helped with the chest pressure slightly . Avoids salt ,  Occasional cough, no fever,   No blood in stool, no night sweats. Glucose levels have been ok - does not check glucose levels  Has not missed any doses of meds.    Heart Pathway Score:     Past Medical History:  Diagnosis Date   Anxiety    Arthritis    Atrial fibrillation (HCC)    Bulging lumbar disc    Chronic lower back pain    Coronary atherosclerosis of native coronary artery    DES LAD 02/2008, DES LAD 02/2015   Depression    Dyslipidemia    Essential hypertension    Facial numbness    Frequent headaches    Glaucoma    Hypertension    Paroxysmal atrial fibrillation [facility])    Diagnosed October 2019    PSVT (paroxysmal supraventricular tachycardia) ([place])    Refusal of blood transfusions as patient is Jehovah's Witness    Type 2 diabetes mellitus ([place])    Borderline    Past Surgical History:  Procedure Laterality Date   CARDIAC CATHETERIZATION N/A 02/23/2015   Procedure: Left Heart Cath and Coronary Angiography;  Surgeon: Sherren Mocha, MD;  Location: [facility];  Service: Cardiovascular;  Laterality: N/A;   CARDIAC CATHETERIZATION N/A 02/23/2015   Procedure: Coronary Stent Intervention;  Surgeon: Sherren Mocha, MD;  Location: [place] [facility];  Service: Cardiovascular;  Laterality: N/A;   CARDIAC CATHETERIZATION N/A 01/04/2016   Procedure: Left Heart Cath and Coronary Angiography;  Surgeon: Peter M Martinique, MD;  Location: [place] [facility];  Service: Cardiovascular;  Laterality: N/A;   CORONARY ANGIOPLASTY  02/23/2015   CORONARY ANGIOPLASTY WITH STENT PLACEMENT  2009   DILATION AND CURETTAGE OF UTERUS     TUBAL LIGATION       Home Medications:  Prior to Admission medications   Medication Sig Start Date End Date Taking? Authorizing Provider  acetaminophen (TYLENOL) 650 MG CR tablet Take 1,300 mg by mouth every 8 (eight) hours as needed for pain.    [provider]  Ascorbic Acid (VITAMIN C) 500 MG tablet Take 500 mg by mouth daily.      [provider]  bimatoprost (LUMIGAN) 0.01 %  SOLN Place 1 drop into both eyes at bedtime.    [provider]  blood glucose meter kit and supplies Dispense based on patient and insurance preference. Use up to four times daily as directed. (FOR ICD-10 E10.9, E11.9). 12/21/17   Dhungel, Flonnie Overman, MD  Calcium Carbonate Antacid (TUMS PO) Take 2 tablets by mouth 4 (four) times daily as needed (for acid reflux/indigestion).     [provider]  Cholecalciferol (VITAMIN D) 400 UNITS capsule Take 400 Units by mouth daily.      [provider]  Cyanocobalamin (VITAMIN B 12 PO) Take 1 tablet by mouth  daily.    [provider]  diltiazem (CARDIZEM CD) 180 MG 24 hr capsule Take 1 capsule (180 mg total) by mouth daily. 01/31/18   Strader, Fransisco Hertz, PA-C  ELIQUIS 5 MG TABS tablet TAKE ONE TABLET BY MOUTH TWICE DAILY 08/30/18   Satira Sark, MD  Garlic 157 MG TABS Take 1 tablet by mouth daily.      [provider]  HYDROcodone-acetaminophen (NORCO) 10-325 MG tablet Take 1 tablet by mouth 4 (four) times daily as needed. For pain. 10/29/15   [provider]  hydrocortisone 2.5 % cream Apply 2 (two) times daily topically. 01/01/17   Lendon Colonel, NP  ibuprofen (ADVIL,MOTRIN) 200 MG tablet Take 600 mg by mouth every 6 (six) hours as needed (for pain.).     [provider]  MAGNESIUM CARBONATE PO Take 1 tablet by mouth daily. 400 mg    [provider]  Menthol, Topical Analgesic, (BIOFREEZE) 4 % GEL Apply 1 application topically 4 (four) times daily as needed (for pain).     [provider]  metoprolol tartrate (LOPRESSOR) 50 MG tablet Take 2 tablets (100 mg total) by mouth 2 (two) times daily. 01/31/18   Strader, Fransisco Hertz, PA-C  nitroGLYCERIN (NITROSTAT) 0.4 MG SL tablet Place 0.4 mg under the tongue every 5 (five) minutes as needed for chest pain.    [provider]  pantoprazole (PROTONIX) 40 MG tablet TAKE ONE (1) TABLET EACH DAY 10/24/17   Satira Sark, MD  promethazine (PHENERGAN) 25 MG tablet 1 tablet as needed. 08/08/18   [provider]  timolol (TIMOPTIC) 0.5 % ophthalmic solution Place 1 drop into both eyes daily.    [provider]  TURMERIC PO Take 1 tablet by mouth daily.    [provider]  VENTOLIN HFA 108 (90 Base) MCG/ACT inhaler Inhale 1-2 puffs into the lungs every 4 (four) hours as needed for shortness of breath. 12/21/15   [provider]  zolpidem (AMBIEN) 10 MG tablet Take 10 mg by mouth at bedtime as needed for sleep.     [provider]    Inpatient  Medications: Scheduled Meds:  sodium chloride flush  3 mL Intravenous Once   Continuous Infusions:  PRN Meds:   Allergies:   No Known Allergies  Social History:   Social History   Socioeconomic History   Marital status: Widowed    Spouse name: Not on file   Number of children: 8   Years of education: 10th grade   Highest education level: Not on file  Occupational History   Occupation: RETIRED  Social Designer, fashion/clothing strain: Not on file   Food insecurity    Worry: Not on file    Inability: Not on file   Transportation needs    Medical: Not on file    Non-medical: Not on  file  Tobacco Use   Smoking status: Never Smoker   Smokeless tobacco: Never Used  Substance and Sexual Activity   Alcohol use: No    Alcohol/week: 0.0 standard drinks   Drug use: No   Sexual activity: Never  Lifestyle   Physical activity    Days per week: Not on file    Minutes per session: Not on file   Stress: Not on file  Relationships   Social connections    Talks on phone: Not on file    Gets together: Not on file    Attends religious service: Not on file    Active member of club or organization: Not on file    Attends meetings of clubs or organizations: Not on file    Relationship status: Not on file   Intimate partner violence    Fear of current or ex partner: Not on file    Emotionally abused: Not on file    Physically abused: Not on file    Forced sexual activity: Not on file  Other Topics Concern   Not on file  Social History Narrative   Lives at home alone.   Right-handed.   No caffeine use.    Family History:    Family History  Problem Relation Age of Onset   Other Mother        "natural causes"   Cancer Father        unsure of type     ROS:  Please see the history of present illness.   All other ROS reviewed and negative.     Physical Exam/Data:   Vitals:   11/11/18 1658 11/11/18 1700 11/11/18 1730 11/11/18 1800  BP:  122/88  129/85 106/79  Pulse: (!) 47 87 (!) 57 67  Resp: 16 18 17 18   Temp:      SpO2: 99% 100% 98% 99%  Weight:      Height:       No intake or output data in the 24 hours ending 11/11/18 1807 Last 3 Weights 11/11/2018 09/24/2018 08/20/2018  Weight (lbs) 183 lb 179 lb 182 lb  Weight (kg) 83.008 kg 81.194 kg 82.555 kg     Body mass index is 31.41 kg/m.  General:  Elderly female,   Mildly uncomfortable  HEENT: normal Lymph: no adenopathy Neck: no JVD Endocrine:  No thryomegaly Vascular: No carotid bruits; FA pulses 2+ bilaterally without bruits  Cardiac: Irreg. Irreg.  Lungs:  clear to auscultation bilaterally, no wheezing, rhonchi or rales  Abd: soft, nontender, no hepatomegaly  Ext: no edema Musculoskeletal:  No deformities, BUE and BLE strength normal and equal Skin: warm and dry  Neuro:  CNs 2-12 intact, no focal abnormalities noted Psych:  Normal affect   EKG:  Atrial fib with RVR   Telemetry:  Telemetry was personally reviewed and demonstrates:  Afib with RVR   Relevant CV Studies:   Laboratory Data:  High Sensitivity Troponin:   Recent Labs  Lab 11/11/18 1250  TROPONINIHS 5     Chemistry Recent Labs  Lab 11/11/18 1250  NA 138  K 3.9  CL 102  CO2 27  GLUCOSE 227*  BUN 11  CREATININE 0.90  CALCIUM 9.5  GFRNONAA >60  GFRAA >60  ANIONGAP 9    No results for input(s): PROT, ALBUMIN, AST, ALT, ALKPHOS, BILITOT in the last 168 hours. Hematology Recent Labs  Lab 11/11/18 1250  WBC 6.4  RBC 3.85*  HGB 11.9*  HCT 35.6*  MCV 92.5  MCH 30.9  MCHC 33.4  RDW 13.2  PLT 197   BNPNo results for input(s): BNP, PROBNP in the last 168 hours.  DDimer No results for input(s): DDIMER in the last 168 hours.   Radiology/Studies:  Dg Chest 2 View  Result Date: 11/11/2018 CLINICAL DATA:  Onset chest pain and tightness 11/08/2018. EXAM: CHEST - 2 VIEW COMPARISON:  PA and lateral chest 05/12/2016. Single-view of the chest 12/17/2017. CT chest 11/01/2012. FINDINGS: The  lungs are clear. Heart size is upper normal. Aortic atherosclerosis noted. No pneumothorax or pleural fluid. No acute or focal bony abnormality. IMPRESSION: No acute disease. Atherosclerosis. Electronically Signed   By: Inge Rise M.D.   On: 11/11/2018 16:06    Assessment and Plan:   1. Rapid atrial fib:   The patient symptoms started several weeks ago.  She has had some chest tightness but despite having several days of chest tightness her troponin level is negative.  I suspect that her symptoms are really related to her rapid atrial fibrillation.  She is not missed any doses of Eliquis. We will cardiovert her in the emergency room.  Assuming that she converts and feels better, we will anticipate that she goes home from the emergency room.  If she does not convert we will likely need to admit her and continue with further work-up.  She may need the addition of an antiarrhythmic. We discussed the risks, benefits, options of the cardioversion.  She understands and agrees to proceed.  2.  Hypertension: Blood pressure seems to be fairly well controlled.  She watches her salt intake.  3.  Coronary artery disease.  She is status post DES placement to the LAD in 2017.  Her symptoms are not all that similar to her previous episodes of angina.  She did take a nitroglycerin this morning with minimal relief of her symptoms.  4.  Hyperlipidemia:   Her last LDL is 127.   Has been intolerant to statins in the past.  Dr. Domenic Polite has discussed this with her on numerous occasions.  She may need a referral to the lipid clinic for consideration for PCSK9 inhibitor.     For questions or updates, please contact [place] Please consult [email or web address] for contact info under     Signed, Mertie Moores, MD  11/11/2018 6:07 PM

## 2018-11-11 NOTE — ED Triage Notes (Signed)
Pt with chest tightness feels better with belching, denies radiation and reports nausea and vomiting x1 yesterday. Pt takes eloquis for hx of MI.

## 2018-11-11 NOTE — Discharge Instructions (Addendum)
Continue medications as previously prescribed.  Follow-up with your cardiologist later this week, and return to the emergency department if you develop chest pain, worsening breathing, worsening palpitations, or other new and concerning symptoms.

## 2018-11-11 NOTE — Telephone Encounter (Signed)
Patient informed. 

## 2018-11-11 NOTE — ED Notes (Signed)
Hr at triage fluctuating between 45-156 EKG being obtained.

## 2018-11-11 NOTE — ED Notes (Signed)
Pt verbalized understanding of discharge instructions and follow up requests.  No further questions at this time.

## 2018-11-11 NOTE — Telephone Encounter (Signed)
Reports SOB with activity especially walking, chest tightness rated 9/10 that started on Friday. Reports active sob and chest tightness rated 7/10. Took one nitroglycerin on Friday and the chest tightness resolved. Reports nausea yesterday that resulted in vomiting x's 1. Also reports ankle swelling that started on Thursday that continues, small non-productive cough. Denies swelling in other body parts. Says she has been able to lie down at night for rest. Denies congestion, fever or sore throat.Reports BP & HR has been fluctuating. Also says, the BP & HR readings were taken randomly. Advised patient to check her BP & HR now, then take nitro while on phone. BP 124/83 & HR 104 at this time. After taking nitroglycerin, patient said the chest tightness resolved. Per [facility], patient need to be seen in ED for an evaluation. Patient and daughter informed and verbalized understanding of plan.      COVID-19 Pre-Screening Questions:  . In the past 7 to 10 days have you had a cough,  shortness of breath, headache, congestion, fever (100 or greater) body aches, chills, sore throat, or sudden loss of taste or sense of smell? Yes sob, cough . Have you been around anyone with known Covid 19. Yes-granddaughter . Have you been around anyone who is awaiting Covid 19 test results in the past 7 to 10 days? Yes-last week . Have you been around anyone who has been exposed to Covid 19, or has mentioned symptoms of Covid 19 within the past 7 to 10 days?Yes  If you have any concerns/questions about symptoms patients report during screening (either on the phone or at threshold). Contact the provider seeing the patient or DOD for further guidance.  If neither are available contact a member of the leadership team.

## 2018-11-11 NOTE — ED Notes (Signed)
Pt. Was ambulated in hall , pt. tolerated well, no signs of dizziness.

## 2018-11-11 NOTE — Sedation Documentation (Signed)
Pt synchronized cardioverted at 120J by Dr. Stark Jock.

## 2018-11-12 ENCOUNTER — Other Ambulatory Visit (HOSPITAL_COMMUNITY): Payer: Self-pay | Admitting: *Deleted

## 2018-11-13 ENCOUNTER — Ambulatory Visit (HOSPITAL_COMMUNITY)
Admission: RE | Admit: 2018-11-13 | Discharge: 2018-11-13 | Disposition: A | Discharge status: Home / Self Care | Payer: Medicare Other | Source: Ambulatory Visit | Attending: Physician Assistant | Admitting: Physician Assistant

## 2018-11-13 ENCOUNTER — Encounter (HOSPITAL_COMMUNITY): Payer: Self-pay | Admitting: Physician Assistant

## 2018-11-13 ENCOUNTER — Other Ambulatory Visit: Payer: Self-pay

## 2018-11-13 VITALS — BP 150/78 | HR 61 | Ht 64.0 in | Wt 186.4 lb

## 2018-11-13 DIAGNOSIS — Z79899 Other long term (current) drug therapy: Secondary | ICD-10-CM | POA: Insufficient documentation

## 2018-11-13 DIAGNOSIS — R7303 Prediabetes: Secondary | ICD-10-CM | POA: Diagnosis not present

## 2018-11-13 DIAGNOSIS — E669 Obesity, unspecified: Secondary | ICD-10-CM | POA: Insufficient documentation

## 2018-11-13 DIAGNOSIS — I480 Paroxysmal atrial fibrillation: Secondary | ICD-10-CM | POA: Diagnosis not present

## 2018-11-13 DIAGNOSIS — Z7901 Long term (current) use of anticoagulants: Secondary | ICD-10-CM | POA: Diagnosis not present

## 2018-11-13 DIAGNOSIS — M1990 Unspecified osteoarthritis, unspecified site: Secondary | ICD-10-CM | POA: Insufficient documentation

## 2018-11-13 DIAGNOSIS — I10 Essential (primary) hypertension: Secondary | ICD-10-CM | POA: Insufficient documentation

## 2018-11-13 DIAGNOSIS — E785 Hyperlipidemia, unspecified: Secondary | ICD-10-CM | POA: Diagnosis not present

## 2018-11-13 DIAGNOSIS — I471 Supraventricular tachycardia: Secondary | ICD-10-CM | POA: Diagnosis not present

## 2018-11-13 DIAGNOSIS — I2510 Atherosclerotic heart disease of native coronary artery without angina pectoris: Secondary | ICD-10-CM | POA: Diagnosis not present

## 2018-11-13 DIAGNOSIS — Z6832 Body mass index (BMI) 32.0-32.9, adult: Secondary | ICD-10-CM | POA: Insufficient documentation

## 2018-11-13 DIAGNOSIS — H409 Unspecified glaucoma: Secondary | ICD-10-CM | POA: Insufficient documentation

## 2018-11-13 LAB — COMPREHENSIVE METABOLIC PANEL
ALT: 115 U/L — ABNORMAL HIGH (ref 0–44)
AST: 99 U/L — ABNORMAL HIGH (ref 15–41)
Albumin: 3.7 g/dL (ref 3.5–5.0)
Alkaline Phosphatase: 89 U/L (ref 38–126)
Anion gap: 11 (ref 5–15)
BUN: 9 mg/dL (ref 8–23)
CO2: 25 mmol/L (ref 22–32)
Calcium: 9.2 mg/dL (ref 8.9–10.3)
Chloride: 101 mmol/L (ref 98–111)
Creatinine, Ser: 0.82 mg/dL (ref 0.44–1.00)
GFR calc Af Amer: >60 mL/min (ref >60)
GFR calc non Af Amer: >60 mL/min (ref >60)
Glucose, Bld: 155 mg/dL — ABNORMAL HIGH (ref 70–99)
Potassium: 3.8 mmol/L (ref 3.5–5.1)
Sodium: 137 mmol/L (ref 135–145)
Total Bilirubin: 0.7 mg/dL (ref 0.3–1.2)
Total Protein: 6.2 g/dL — ABNORMAL LOW (ref 6.5–8.1)

## 2018-11-13 LAB — TSH: TSH: 1.134 u[IU]/mL (ref 0.350–4.500)

## 2018-11-13 MED ORDER — METOPROLOL TARTRATE 100 MG PO TABS: 100.0000 mg | ORAL_TABLET | Freq: Two times a day (BID) | ORAL | 2 refills | Status: DC

## 2018-11-13 MED ORDER — AMIODARONE HCL 200 MG PO TABS: ORAL_TABLET | ORAL | 0 refills | Status: DC

## 2018-11-13 NOTE — Patient Instructions (Signed)
Start Amiodarone 200mg  twice a day (with food) for 1 month then reduce to 1 tablet a day  Dr Rayann Heman in 3 months in [place] - scheduling will call you with appointment.

## 2018-11-13 NOTE — Progress Notes (Signed)
Primary Care Physician: Scotty Court, DO Primary Cardiologist: Dr Domenic Polite Primary Electrophysiologist: none Referring Physician: Dr Reinaldo Raddle [name] is a 78 y.o. female with a history of CAD, HTN, DM, HLD, and persistent atrial fibrillation who presents for consultation in the [facility].  The patient was initially diagnosed with atrial fibrillation on 11/2017 after presenting with symptoms of palpitations and chest pain to ER and found to be in afib with RVR. She was discharged with Eliquis and rate control with plan to cardiovert on f/u but she had converted on her own. She denies snoring or significant alcohol use.   On follow up today, patient was seen at the ER 11/11/18 with symptoms of SOB, chest tightness, and palpitations for 3 days. She was found to be in afib with RVR and underwent successful DCCV. Zio patch prior to this episode showed no afib but did show very frequent short episodes of SVT. She remains fatigued and SOB today despite being in SR.  Today, she denies symptoms of palpitations, chest pain, orthopnea, PND, lower extremity edema, dizziness, presyncope, syncope, snoring, daytime somnolence, bleeding, or neurologic sequela. The patient is tolerating medications without difficulties and is otherwise without complaint today.    Atrial Fibrillation Risk Factors:  she does not have symptoms or diagnosis of sleep apnea. she does not have a history of rheumatic fever. she does not have a history of alcohol use. The patient does not have a history of early familial atrial fibrillation or other arrhythmias.  she has a BMI of Body mass index is 32 kg/m.Marland Kitchen Filed Weights   11/13/18 1457  Weight: 84.6 kg    Family History  Problem Relation Age of Onset  . Other Mother        "natural causes"  . Cancer Father        unsure of type     Atrial Fibrillation Management history:  Previous antiarrhythmic drugs: none Previous  cardioversions: 11/11/18 Previous ablations: none CHADS2VASC score: 6 Anticoagulation history: Eliquis   Past Medical History:  Diagnosis Date  . Anxiety   . Arthritis   . Atrial fibrillation ([place])   . Bulging lumbar disc   . Chronic lower back pain   . Coronary atherosclerosis of native coronary artery    DES LAD 02/2008, DES LAD 02/2015  . Depression   . Dyslipidemia   . Essential hypertension   . Facial numbness   . Frequent headaches   . Glaucoma   . Hypertension   . Paroxysmal atrial fibrillation [facility])    Diagnosed October 2019  . PSVT (paroxysmal supraventricular tachycardia) ([place])   . Refusal of blood transfusions as patient is Jehovah's Witness   . Type 2 diabetes mellitus (HCC)    Borderline   Past Surgical History:  Procedure Laterality Date  . CARDIAC CATHETERIZATION N/A 02/23/2015   Procedure: Left Heart Cath and Coronary Angiography;  Surgeon: Sherren Mocha, MD;  Location: [place] [facility];  Service: Cardiovascular;  Laterality: N/A;  . CARDIAC CATHETERIZATION N/A 02/23/2015   Procedure: Coronary Stent Intervention;  Surgeon: Sherren Mocha, MD;  Location: [place] [facility];  Service: Cardiovascular;  Laterality: N/A;  . CARDIAC CATHETERIZATION N/A 01/04/2016   Procedure: Left Heart Cath and Coronary Angiography;  Surgeon: Peter M Martinique, MD;  Location: [place] [facility];  Service: Cardiovascular;  Laterality: N/A;  . CORONARY ANGIOPLASTY  02/23/2015  . CORONARY ANGIOPLASTY WITH STENT PLACEMENT  2009  . DILATION AND CURETTAGE OF  UTERUS    . TUBAL LIGATION      Current Outpatient Medications  Medication Sig Dispense Refill  . acetaminophen (TYLENOL) 650 MG CR tablet Take 1,300 mg by mouth every 8 (eight) hours as needed for pain.    . Ascorbic Acid (VITAMIN C) 500 MG tablet Take 500 mg by mouth daily.      . bimatoprost (LUMIGAN) 0.01 % SOLN Place 1 drop into both eyes at bedtime.    . blood glucose meter kit and supplies Dispense based on patient and insurance  preference. Use up to four times daily as directed. (FOR ICD-10 E10.9, E11.9). 1 each 0  . Calcium Carbonate Antacid (TUMS PO) Take 2 tablets by mouth 4 (four) times daily as needed (for acid reflux/indigestion).     . Cholecalciferol (VITAMIN D) 400 UNITS capsule Take 400 Units by mouth daily.      . Cyanocobalamin (VITAMIN B 12 PO) Take 1 tablet by mouth daily.    Marland Kitchen diltiazem (CARDIZEM CD) 180 MG 24 hr capsule Take 1 capsule (180 mg total) by mouth daily. 90 capsule 3  . ELIQUIS 5 MG TABS tablet TAKE ONE TABLET BY MOUTH TWICE DAILY 60 tablet 3  . Garlic 726 MG TABS Take 1 tablet by mouth daily.      Marland Kitchen HYDROcodone-acetaminophen (NORCO) 10-325 MG tablet Take 1 tablet by mouth 4 (four) times daily as needed. For pain.    . hydrocortisone 2.5 % cream Apply 2 (two) times daily topically. 30 g 0  . ibuprofen (ADVIL,MOTRIN) 200 MG tablet Take 600 mg by mouth every 6 (six) hours as needed (for pain.).     Marland Kitchen MAGNESIUM CARBONATE PO Take 1 tablet by mouth daily. 400 mg    . Menthol, Topical Analgesic, (BIOFREEZE) 4 % GEL Apply 1 application topically 4 (four) times daily as needed (for pain).     . metoprolol tartrate (LOPRESSOR) 100 MG tablet Take 1 tablet (100 mg total) by mouth 2 (two) times daily. 180 tablet 2  . nitroGLYCERIN (NITROSTAT) 0.4 MG SL tablet Place 0.4 mg under the tongue every 5 (five) minutes as needed for chest pain.    . pantoprazole (PROTONIX) 40 MG tablet TAKE ONE (1) TABLET EACH DAY 90 tablet 2  . promethazine (PHENERGAN) 25 MG tablet 1 tablet as needed.    . timolol (TIMOPTIC) 0.5 % ophthalmic solution Place 1 drop into both eyes daily.    . TURMERIC PO Take 1 tablet by mouth daily.    . VENTOLIN HFA 108 (90 Base) MCG/ACT inhaler Inhale 1-2 puffs into the lungs every 4 (four) hours as needed for shortness of breath.    . zolpidem (AMBIEN) 10 MG tablet Take 10 mg by mouth at bedtime as needed for sleep.     Marland Kitchen amiodarone (PACERONE) 200 MG tablet Take 1 tablet by mouth twice daily  for 1 month then decrease to 1 tablet a day 60 tablet 0   No current facility-administered medications for this encounter.     No Known Allergies  Social History   Socioeconomic History  . Marital status: Widowed    Spouse name: Not on file  . Number of children: 8  . Years of education: 10th grade  . Highest education level: Not on file  Occupational History  . Occupation: RETIRED  Social Needs  . Financial resource strain: Not on file  . Food insecurity    Worry: Not on file    Inability: Not on file  . Transportation needs  Medical: Not on file    Non-medical: Not on file  Tobacco Use  . Smoking status: Never Smoker  . Smokeless tobacco: Never Used  Substance and Sexual Activity  . Alcohol use: No    Alcohol/week: 0.0 standard drinks  . Drug use: No  . Sexual activity: Never  Lifestyle  . Physical activity    Days per week: Not on file    Minutes per session: Not on file  . Stress: Not on file  Relationships  . Social Herbalist on phone: Not on file    Gets together: Not on file    Attends religious service: Not on file    Active member of club or organization: Not on file    Attends meetings of clubs or organizations: Not on file    Relationship status: Not on file  . Intimate partner violence    Fear of current or ex partner: Not on file    Emotionally abused: Not on file    Physically abused: Not on file    Forced sexual activity: Not on file  Other Topics Concern  . Not on file  Social History Narrative   Lives at home alone.   Right-handed.   No caffeine use.     ROS- All systems are reviewed and negative except as per the HPI above.  Physical Exam: Vitals:   11/13/18 1457  BP: (!) 150/78  Pulse: 61  SpO2: 97%  Weight: 84.6 kg  Height: 5' 4"  (1.626 m)    GEN- The patient is well appearing obese female, alert and oriented x 3 today.   HEENT-head normocephalic, atraumatic, sclera clear, conjunctiva pink, hearing intact,  trachea midline. Lungs- Clear to ausculation bilaterally, normal work of breathing Heart- Regular rate and rhythm, no murmurs, rubs or gallops  GI- soft, NT, ND, + BS Extremities- no clubbing, cyanosis, or edema MS- no significant deformity or atrophy Skin- no rash or lesion Psych- euthymic mood, full affect Neuro- strength and sensation are intact   Wt Readings from Last 3 Encounters:  11/13/18 84.6 kg  11/11/18 83 kg  09/24/18 81.2 kg    EKG today demonstrates SR HR 61, PR 180, QRS 62, QTc 446  Echo 12/18/17 demonstrated  - Left ventricle: The cavity size was normal. Wall thickness was   increased in a pattern of moderate LVH. Systolic function was   vigorous. The estimated ejection fraction was in the range of 65%   to 70%. Wall motion was normal; there were no regional wall   motion abnormalities. The study is not technically sufficient to   allow evaluation of LV diastolic function. - Aortic valve: Mildly calcified annulus. Trileaflet; mildly   thickened leaflets. Valve area (VTI): 3.26 cm^2. Valve area   (Vmax): 3.2 cm^2. - Mitral valve: Mildly calcified annulus. Mildly thickened leaflets   . - Left atrium: The atrium was mildly dilated. - Atrial septum: No defect or patent foramen ovale was identified. - Pulmonary arteries: Systolic pressure was mildly increased. PA   peak pressure: 31 mm Hg (S).   Epic records are reviewed at length today   Assessment and Plan: 1. Paroxysmal atrial fibrillation/SVT S/p DCCV on 11/11/18. We discussed therapeutic options including AAD.  Would avoid class IC with h/o CAD. After discussing the risks and benefits, will start amiodarone 200 mg BID for one month, then decrease to 200 mg daily.  Cmet/TSH today. Continue Eliquis 5 mg BID Continue diltiazem 180 mg daily Continue Lopressor 100 mg  BID  This patients CHA2DS2-VASc Score and unadjusted Ischemic Stroke Rate (% per year) is equal to 9.7 % stroke rate/year from a score of 6   Above score calculated as 1 point each if present [CHF, HTN, DM, Vascular=MI/PAD/Aortic Plaque, Age if 65-74, or Female] Above score calculated as 2 points each if present [Age > 75, or Stroke/TIA/TE]  2. Obesity Body mass index is 32 kg/m. Lifestyle modification was discussed and encouraged including regular physical activity and weight reduction.  3. CAD S/p DES to LAD 2010 and 2017 Myoview 08/29/18 showed no ischemia, EF 62% No anginal symptoms.  4. HTN Stable, no changes today.    Follow up with Dr Domenic Polite as scheduled. Patient would prefer to follow up with Dr Rayann Heman in [place] to minimize her travel time. Will refer.    [facility] [address] [place], [place] [address] [phone] 11/14/2018 9:32 AM

## 2018-11-18 ENCOUNTER — Telehealth: Payer: Self-pay | Admitting: Cardiology

## 2018-11-18 MED ORDER — APIXABAN 5 MG PO TABS: 5.0000 mg | ORAL_TABLET | Freq: Two times a day (BID) | ORAL | 0 refills | Status: DC

## 2018-11-18 NOTE — Telephone Encounter (Signed)
Asking for Eliquis Samples

## 2018-11-18 NOTE — Telephone Encounter (Signed)
Patient notified samples ready for pick up.  Patient assistance application provided as well.  Advised to call when she arrives into parking lot and someone will bring out to her.

## 2018-11-21 LAB — LIPID PANEL
Cholesterol: 183 (ref 0–200)
HDL: 45 (ref 35–70)
LDL Cholesterol: 112
Triglycerides: 149 (ref 40–160)

## 2018-11-21 LAB — VITAMIN D 25 HYDROXY (VIT D DEFICIENCY, FRACTURES): Vit D, 25-Hydroxy: 78.3

## 2018-11-22 ENCOUNTER — Ambulatory Visit: Payer: Medicare Other | Admitting: Cardiology

## 2018-11-25 ENCOUNTER — Telehealth: Payer: Medicare Other | Admitting: Physician Assistant

## 2018-11-25 ENCOUNTER — Telehealth: Payer: Self-pay | Admitting: Cardiology

## 2018-11-25 NOTE — Telephone Encounter (Signed)
Patient called asking if she needs to have blood work done

## 2018-11-25 NOTE — Telephone Encounter (Signed)
Pt aware that she doesn't need labs done (scanned in today from 10/2)

## 2018-11-26 ENCOUNTER — Ambulatory Visit: Payer: Medicare Other | Admitting: Cardiology

## 2018-11-26 ENCOUNTER — Other Ambulatory Visit: Payer: Self-pay

## 2018-11-26 ENCOUNTER — Encounter: Payer: Self-pay | Admitting: Cardiology

## 2018-11-26 VITALS — BP 151/75 | HR 61 | Ht 64.0 in | Wt 179.2 lb

## 2018-11-26 DIAGNOSIS — I471 Supraventricular tachycardia: Secondary | ICD-10-CM | POA: Diagnosis not present

## 2018-11-26 DIAGNOSIS — I4819 Other persistent atrial fibrillation: Secondary | ICD-10-CM

## 2018-11-26 DIAGNOSIS — I25119 Atherosclerotic heart disease of native coronary artery with unspecified angina pectoris: Secondary | ICD-10-CM | POA: Diagnosis not present

## 2018-11-26 DIAGNOSIS — I10 Essential (primary) hypertension: Secondary | ICD-10-CM

## 2018-11-26 MED ORDER — AMIODARONE HCL 200 MG PO TABS: ORAL_TABLET | ORAL | 2 refills | Status: DC

## 2018-11-26 NOTE — Progress Notes (Signed)
Cardiology Office Note  Date: 11/26/2018   ID: [name], DOB [birth year], MRN [id number]  PCP:  Adaline Sill, NP  Cardiologist:  Rozann Lesches, MD Electrophysiologist:  None   Chief Complaint  Patient presents with  . Cardiac follow-up    History of Present Illness: [name] is a 78 y.o. female last assessed via telehealth encounter in April. She has had interval follow-up in the atrial fibrillation clinic, most recently in late September following cardioversion of rapid atrial fibrillation.  She was started on amiodarone for rhythm suppression at that time, otherwise continued on Eliquis as well as combination of Cardizem CD and Lopressor.  She was also referred for interval ischemic testing with low risk Lexiscan Myoview in July.  She presents today for follow-up.  She does not report any obvious angina symptoms and states that sense of palpitations and shortness of breath seems to be less.  I reviewed her medications and we discussed reducing amiodarone to 200 mg daily after this week.  She reports no bleeding problems on Eliquis.  I did review her recent lab work, LFTs were increased on September 23, TSH normal.  Past Medical History:  Diagnosis Date  . Anxiety   . Arthritis   . Atrial fibrillation ([place])   . Bulging lumbar disc   . Chronic lower back pain   . Coronary atherosclerosis of native coronary artery    DES LAD 02/2008, DES LAD 02/2015  . Depression   . Dyslipidemia   . Essential hypertension   . Facial numbness   . Frequent headaches   . Glaucoma   . Hypertension   . Paroxysmal atrial fibrillation [facility])    Diagnosed October 2019  . PSVT (paroxysmal supraventricular tachycardia) ([place])   . Refusal of blood transfusions as patient is Jehovah's Witness   . Type 2 diabetes mellitus (HCC)    Borderline    Past Surgical History:  Procedure Laterality Date  . CARDIAC CATHETERIZATION N/A 02/23/2015   Procedure: Left Heart Cath and Coronary  Angiography;  Surgeon: Sherren Mocha, MD;  Location: [place] [facility];  Service: Cardiovascular;  Laterality: N/A;  . CARDIAC CATHETERIZATION N/A 02/23/2015   Procedure: Coronary Stent Intervention;  Surgeon: Sherren Mocha, MD;  Location: [place] [facility];  Service: Cardiovascular;  Laterality: N/A;  . CARDIAC CATHETERIZATION N/A 01/04/2016   Procedure: Left Heart Cath and Coronary Angiography;  Surgeon: Peter M Martinique, MD;  Location: [place] [facility];  Service: Cardiovascular;  Laterality: N/A;  . CORONARY ANGIOPLASTY  02/23/2015  . CORONARY ANGIOPLASTY WITH STENT PLACEMENT  2009  . DILATION AND CURETTAGE OF UTERUS    . TUBAL LIGATION      Current Outpatient Medications  Medication Sig Dispense Refill  . acetaminophen (TYLENOL) 650 MG CR tablet Take 1,300 mg by mouth every 8 (eight) hours as needed for pain.    Marland Kitchen amiodarone (PACERONE) 200 MG tablet Take 1 tablet by mouth twice daily through Friday (11/29/2018) this week. On Saturday, November 30, 2018, decrease to 200 mg by mouth daily 30 tablet 2  . apixaban (ELIQUIS) 5 MG TABS tablet Take 1 tablet (5 mg total) by mouth 2 (two) times daily. 28 tablet 0  . Ascorbic Acid (VITAMIN C) 500 MG tablet Take 500 mg by mouth daily.      . bimatoprost (LUMIGAN) 0.01 % SOLN Place 1 drop into both eyes at bedtime.    . blood glucose meter kit and supplies Dispense based on patient and  insurance preference. Use up to four times daily as directed. (FOR ICD-10 E10.9, E11.9). 1 each 0  . Calcium Carbonate Antacid (TUMS PO) Take 2 tablets by mouth 4 (four) times daily as needed (for acid reflux/indigestion).     . Cholecalciferol (VITAMIN D) 400 UNITS capsule Take 400 Units by mouth daily.      . Cyanocobalamin (VITAMIN B 12 PO) Take 1 tablet by mouth daily.    Marland Kitchen diltiazem (CARDIZEM CD) 180 MG 24 hr capsule Take 1 capsule (180 mg total) by mouth daily. 90 capsule 3  . Garlic 544 MG TABS Take 1 tablet by mouth daily.      Marland Kitchen HYDROcodone-acetaminophen  (NORCO) 10-325 MG tablet Take 1 tablet by mouth 4 (four) times daily as needed. For pain.    . hydrocortisone 2.5 % cream Apply 2 (two) times daily topically. 30 g 0  . ibuprofen (ADVIL,MOTRIN) 200 MG tablet Take 600 mg by mouth every 6 (six) hours as needed (for pain.).     Marland Kitchen MAGNESIUM CARBONATE PO Take 1 tablet by mouth daily. 400 mg    . Menthol, Topical Analgesic, (BIOFREEZE) 4 % GEL Apply 1 application topically 4 (four) times daily as needed (for pain).     . metFORMIN (GLUCOPHAGE-XR) 500 MG 24 hr tablet Take 2 tablets by mouth daily.    . metoprolol tartrate (LOPRESSOR) 100 MG tablet Take 1 tablet (100 mg total) by mouth 2 (two) times daily. 180 tablet 2  . nitroGLYCERIN (NITROSTAT) 0.4 MG SL tablet Place 0.4 mg under the tongue every 5 (five) minutes as needed for chest pain.    . pantoprazole (PROTONIX) 40 MG tablet TAKE ONE (1) TABLET EACH DAY 90 tablet 2  . promethazine (PHENERGAN) 25 MG tablet 1 tablet as needed.    . timolol (TIMOPTIC) 0.5 % ophthalmic solution Place 1 drop into both eyes daily.    . traZODone (DESYREL) 100 MG tablet Take 1 tablet by mouth at bedtime.    . TURMERIC PO Take 1 tablet by mouth daily.    . VENTOLIN HFA 108 (90 Base) MCG/ACT inhaler Inhale 1-2 puffs into the lungs every 4 (four) hours as needed for shortness of breath.     No current facility-administered medications for this visit.    Allergies:  Patient has no known allergies.   Social History: The patient  reports that she has never smoked. She has never used smokeless tobacco. She reports that she does not drink alcohol or use drugs.   ROS:  Please see the history of present illness. Otherwise, complete review of systems is positive for none.  All other systems are reviewed and negative.   Physical Exam: VS:  BP (!) 151/75   Pulse 61   Ht 5' 4"  (1.626 m)   Wt 179 lb 3.2 oz (81.3 kg)   LMP  (LMP Unknown)   SpO2 98% Comment: on room air  BMI 30.76 kg/m , BMI Body mass index is 30.76 kg/m.   Wt Readings from Last 3 Encounters:  11/26/18 179 lb 3.2 oz (81.3 kg)  11/13/18 186 lb 6.4 oz (84.6 kg)  11/11/18 183 lb (83 kg)    General: Patient appears comfortable at rest. HEENT: Conjunctiva and lids normal, wearing a mask. Neck: Supple, no elevated JVP or carotid bruits, no thyromegaly. Lungs: Clear to auscultation, nonlabored breathing at rest. Cardiac: Regular rate and rhythm, no S3 or significant systolic murmur, no pericardial rub. Abdomen: Soft, nontender, bowel sounds present. Extremities: No pitting edema, distal  pulses 2+. Skin: Warm and dry. Musculoskeletal: No kyphosis. Neuropsychiatric: Alert and oriented x3, affect grossly appropriate.  ECG:  An ECG dated 11/11/2018 was personally reviewed today and demonstrated:  Sinus rhythm with PAC, borderline low voltage in the limb leads.  Recent Labwork: 12/17/2017: Magnesium 2.0 11/11/2018: Hemoglobin 11.9; Platelets 197 11/13/2018: ALT 115; AST 99; BUN 9; Creatinine, Ser 0.82; Potassium 3.8; Sodium 137; TSH 1.134     Component Value Date/Time   CHOL 198 12/17/2017 0943   TRIG 154 (H) 12/17/2017 0943   HDL 40 (L) 12/17/2017 0943   CHOLHDL 5.0 12/17/2017 0943   VLDL 31 12/17/2017 0943   LDLCALC 127 (H) 12/17/2017 0943    Other Studies Reviewed Today:  Cardiac monitor 10/07/2018: Sinus rhythm Very frequent, short episodes of SVT are noted.  These appear at times consistent with ectopic atrial tachycardia 307 Supraventricular Tachycardia runs occurred, the longest lasting 44.2 secs with an avg rate of 127 bpm  No atrial fibrillation is observed No AV block or pauses No sustained ventricular arrhythmias  Lexiscan Myoview 08/29/2018:  The left ventricular ejection fraction is normal (55-65%).  Nuclear stress EF: 62%.  There was no ST segment deviation noted during stress.  The study is normal.  This is a low risk study.   Normal stress nuclear study with no ischemia or infarction.  Gated ejection fraction 62%  with normal wall motion.  Echocardiogram 12/18/2017: Study Conclusions  - Left ventricle: The cavity size was normal. Wall thickness was   increased in a pattern of moderate LVH. Systolic function was   vigorous. The estimated ejection fraction was in the range of 65%   to 70%. Wall motion was normal; there were no regional wall   motion abnormalities. The study is not technically sufficient to   allow evaluation of LV diastolic function. - Aortic valve: Mildly calcified annulus. Trileaflet; mildly   thickened leaflets. Valve area (VTI): 3.26 cm^2. Valve area   (Vmax): 3.2 cm^2. - Mitral valve: Mildly calcified annulus. Mildly thickened leaflets. - Left atrium: The atrium was mildly dilated. - Atrial septum: No defect or patent foramen ovale was identified. - Pulmonary arteries: Systolic pressure was mildly increased. PA   peak pressure: 31 mm Hg (S).  Assessment and Plan:  1.  Paroxysmal to persistent atrial fibrillation with CHADSVASC score of 6.  She is now on amiodarone following initiation in the atrial fibrillation clinic.  Plan to reduce amiodarone to 200 mg once daily after this week, otherwise continue Eliquis, Cardizem CD, and Toprol-XL.  Follow-up CMET in 4 to 6 weeks.  2.  PSVT documented by cardiac monitor.  We will see how she does following initiation of amiodarone for management of PAF.  3.  Essential hypertension, blood pressure is elevated today.  She does have a blood pressure cuff at home, I asked her to keep an eye on blood pressure intermittently in case additional medication adjustments need to be made.  4.  CAD status post DES to the LAD in 2010 in 2017.  She had a follow-up Lexiscan Myoview in July which was low risk and showed no active ischemia.  Medication Adjustments/Labs and Tests Ordered: Current medicines are reviewed at length with the patient today.  Concerns regarding medicines are outlined above.   Tests Ordered: Orders Placed This Encounter   Procedures  . Comprehensive metabolic panel    Medication Changes: Meds ordered this encounter  Medications  . amiodarone (PACERONE) 200 MG tablet    Sig: Take 1 tablet by  mouth twice daily through Friday (11/29/2018) this week. On Saturday, November 30, 2018, decrease to 200 mg by mouth daily    Dispense:  30 tablet    Refill:  2    11/26/2018 change in directions    Disposition:  Follow up 3 months in the [place] office.  Signed, Satira Sark, MD, [facility] 11/26/2018 2:26 PM    [facility] [place], [place], [place] [address] Phone: [phone]; Fax: [phone]

## 2018-11-26 NOTE — Patient Instructions (Addendum)
Medication Instructions:    Your physician has recommended you make the following change in your medication:   On Saturday, October 10 th, 2020, decrease amiodarone to 200 mg by mouth daily  Continue all other medications the same  Labwork:  Your physician recommends that you return for lab work in: 4-6 weeks to check your CMET. You may have this done at [facility].   Testing/Procedures:  NONE  Follow-Up:  Your physician recommends that you schedule a follow-up appointment in: 3 months.  Any Other Special Instructions Will Be Listed Below (If Applicable).  If you need a refill on your cardiac medications before your next appointment, please call your pharmacy.

## 2019-02-03 ENCOUNTER — Other Ambulatory Visit (HOSPITAL_COMMUNITY)
Admission: RE | Admit: 2019-02-03 | Discharge: 2019-02-03 | Disposition: A | Discharge status: Home / Self Care | Payer: Medicare Other | Source: Ambulatory Visit | Attending: Cardiology | Admitting: Cardiology

## 2019-02-03 ENCOUNTER — Other Ambulatory Visit: Payer: Self-pay

## 2019-02-03 DIAGNOSIS — I480 Paroxysmal atrial fibrillation: Secondary | ICD-10-CM | POA: Insufficient documentation

## 2019-02-03 DIAGNOSIS — Z79899 Other long term (current) drug therapy: Secondary | ICD-10-CM | POA: Diagnosis present

## 2019-02-03 LAB — BASIC METABOLIC PANEL
Anion gap: 10 (ref 5–15)
BUN: 9 mg/dL (ref 8–23)
CO2: 29 mmol/L (ref 22–32)
Calcium: 9.6 mg/dL (ref 8.9–10.3)
Chloride: 102 mmol/L (ref 98–111)
Creatinine, Ser: 0.84 mg/dL (ref 0.44–1.00)
GFR calc Af Amer: >60 mL/min (ref >60)
GFR calc non Af Amer: >60 mL/min (ref >60)
Glucose, Bld: 132 mg/dL — ABNORMAL HIGH (ref 70–99)
Potassium: 4 mmol/L (ref 3.5–5.1)
Sodium: 141 mmol/L (ref 135–145)

## 2019-02-03 LAB — CBC
HCT: 41.4 % (ref 36.0–46.0)
Hemoglobin: 13.7 g/dL (ref 12.0–15.0)
MCH: 29.8 pg (ref 26.0–34.0)
MCHC: 33.1 g/dL (ref 30.0–36.0)
MCV: 90.2 fL (ref 80.0–100.0)
Platelets: 252 10*3/uL (ref 150–400)
RBC: 4.59 MIL/uL (ref 3.87–5.11)
RDW: 12.6 % (ref 11.5–15.5)
WBC: 5.2 10*3/uL (ref 4.0–10.5)
nRBC: 0 % (ref 0.0–0.2)

## 2019-02-04 ENCOUNTER — Telehealth: Payer: Self-pay | Admitting: *Deleted

## 2019-02-04 NOTE — Telephone Encounter (Signed)
Patient informed. Copy sent to PCP °

## 2019-02-04 NOTE — Telephone Encounter (Signed)
-----   Message from Satira Sark, MD sent at 02/03/2019  4:14 PM EST ----- Results reviewed.  Potassium and renal function are normal.  Hemoglobin also normal at 13.7, up from last check in September.  Continue with current medications.

## 2019-02-06 ENCOUNTER — Other Ambulatory Visit: Payer: Self-pay | Admitting: Student

## 2019-02-12 ENCOUNTER — Other Ambulatory Visit: Payer: Self-pay | Admitting: Cardiology

## 2019-03-14 ENCOUNTER — Ambulatory Visit: Payer: Medicare Other | Admitting: Cardiology

## 2019-03-14 ENCOUNTER — Encounter: Payer: Self-pay | Admitting: Cardiology

## 2019-03-14 ENCOUNTER — Other Ambulatory Visit: Payer: Self-pay

## 2019-03-14 VITALS — BP 147/80 | HR 58 | Ht 64.0 in | Wt 176.6 lb

## 2019-03-14 DIAGNOSIS — I4819 Other persistent atrial fibrillation: Secondary | ICD-10-CM

## 2019-03-14 DIAGNOSIS — I471 Supraventricular tachycardia: Secondary | ICD-10-CM | POA: Diagnosis not present

## 2019-03-14 DIAGNOSIS — I25119 Atherosclerotic heart disease of native coronary artery with unspecified angina pectoris: Secondary | ICD-10-CM | POA: Diagnosis not present

## 2019-03-14 DIAGNOSIS — I10 Essential (primary) hypertension: Secondary | ICD-10-CM

## 2019-03-14 MED ORDER — APIXABAN 5 MG PO TABS: 5.0000 mg | ORAL_TABLET | Freq: Two times a day (BID) | ORAL | 0 refills | Status: DC

## 2019-03-14 MED ORDER — METOPROLOL TARTRATE 100 MG PO TABS: 100.0000 mg | ORAL_TABLET | Freq: Two times a day (BID) | ORAL | 3 refills | Status: DC

## 2019-03-14 NOTE — Patient Instructions (Addendum)
Medication Instructions:    Your physician recommends that you continue on your current medications as directed. Please refer to the Current Medication list given to you today.  Labwork:  Your physician recommends that you return for non-fasting lab work in: 6 months just before your next visit to check your CMET, CBC & TSH. We will notify you when this is due.  Testing/Procedures:  NONE  Follow-Up:  Your physician recommends that you schedule a follow-up appointment in: 6 months (office). You will receive a reminder letter in the mail in about 4 months reminding you to call and schedule your appointment. If you don't receive this letter, please contact our office.  Any Other Special Instructions Will Be Listed Below (If Applicable).  If you need a refill on your cardiac medications before your next appointment, please call your pharmacy.

## 2019-03-14 NOTE — Progress Notes (Signed)
Cardiology Office Note  Date: 03/14/2019   ID: [name], DOB 1940-07-24, MRN [id number]  PCP:  Adaline Sill, NP  Cardiologist:  Rozann Lesches, MD Electrophysiologist:  None   Chief Complaint  Patient presents with  . Cardiac follow-up    History of Present Illness: [name] is a 79 y.o. female last seen in October 2020.  She presents for a routine visit.  She reports no rapid palpitations or chest pain on current medical regimen.  She has had more arthritic stiffness and pain, admits that she is not as active during the pandemic.  We did talk about trying to do some walking for exercise.  I reviewed her medications which are outlined below.  Cardiac regimen includes amiodarone, Eliquis, Cardizem CD, and Lopressor.  She had lab work in December with normal hemoglobin and renal function.  Past Medical History:  Diagnosis Date  . Anxiety   . Arthritis   . Atrial fibrillation ([place])   . Bulging lumbar disc   . Chronic lower back pain   . Coronary atherosclerosis of native coronary artery    DES LAD 02/2008, DES LAD 02/2015  . Depression   . Dyslipidemia   . Essential hypertension   . Facial numbness   . Frequent headaches   . Glaucoma   . Hypertension   . Paroxysmal atrial fibrillation [facility])    Diagnosed October 2019  . PSVT (paroxysmal supraventricular tachycardia) ([place])   . Refusal of blood transfusions as patient is Jehovah's Witness   . Type 2 diabetes mellitus (HCC)    Borderline    Past Surgical History:  Procedure Laterality Date  . CARDIAC CATHETERIZATION N/A 02/23/2015   Procedure: Left Heart Cath and Coronary Angiography;  Surgeon: Sherren Mocha, MD;  Location: [place] [facility];  Service: Cardiovascular;  Laterality: N/A;  . CARDIAC CATHETERIZATION N/A 02/23/2015   Procedure: Coronary Stent Intervention;  Surgeon: Sherren Mocha, MD;  Location: [place] [facility];  Service: Cardiovascular;  Laterality: N/A;  . CARDIAC CATHETERIZATION N/A  01/04/2016   Procedure: Left Heart Cath and Coronary Angiography;  Surgeon: Peter M Martinique, MD;  Location: [place] [facility];  Service: Cardiovascular;  Laterality: N/A;  . CORONARY ANGIOPLASTY  02/23/2015  . CORONARY ANGIOPLASTY WITH STENT PLACEMENT  2009  . DILATION AND CURETTAGE OF UTERUS    . TUBAL LIGATION      Current Outpatient Medications  Medication Sig Dispense Refill  . acetaminophen (TYLENOL) 650 MG CR tablet Take 1,300 mg by mouth every 8 (eight) hours as needed for pain.    Marland Kitchen amiodarone (PACERONE) 200 MG tablet TAKE ONE (1) TABLET EACH DAY 30 tablet 2  . apixaban (ELIQUIS) 5 MG TABS tablet Take 1 tablet (5 mg total) by mouth 2 (two) times daily. 56 tablet 0  . Ascorbic Acid (VITAMIN C) 500 MG tablet Take 500 mg by mouth daily.      . bimatoprost (LUMIGAN) 0.01 % SOLN Place 1 drop into both eyes at bedtime.    . blood glucose meter kit and supplies Dispense based on patient and insurance preference. Use up to four times daily as directed. (FOR ICD-10 E10.9, E11.9). 1 each 0  . Calcium Carbonate Antacid (TUMS PO) Take 2 tablets by mouth 4 (four) times daily as needed (for acid reflux/indigestion).     . Cholecalciferol (VITAMIN D) 400 UNITS capsule Take 400 Units by mouth daily.      . Cyanocobalamin (VITAMIN B 12 PO) Take 1 tablet  by mouth daily.    Marland Kitchen diltiazem (CARDIZEM CD) 180 MG 24 hr capsule TAKE ONE (1) CAPSULE EACH DAY 90 capsule 3  . Garlic 976 MG TABS Take 1 tablet by mouth daily.      Marland Kitchen HYDROcodone-acetaminophen (NORCO) 10-325 MG tablet Take 1 tablet by mouth 4 (four) times daily as needed. For pain.    . hydrocortisone 2.5 % cream Apply 2 (two) times daily topically. 30 g 0  . ibuprofen (ADVIL,MOTRIN) 200 MG tablet Take 600 mg by mouth every 6 (six) hours as needed (for pain.).     Marland Kitchen MAGNESIUM CARBONATE PO Take 1 tablet by mouth daily. 400 mg    . Menthol, Topical Analgesic, (BIOFREEZE) 4 % GEL Apply 1 application topically 4 (four) times daily as needed (for pain).       . metFORMIN (GLUCOPHAGE-XR) 500 MG 24 hr tablet Take 2 tablets by mouth daily.    . metoprolol tartrate (LOPRESSOR) 100 MG tablet Take 1 tablet (100 mg total) by mouth 2 (two) times daily. 180 tablet 3  . nitroGLYCERIN (NITROSTAT) 0.4 MG SL tablet Place 0.4 mg under the tongue every 5 (five) minutes as needed for chest pain.    . pantoprazole (PROTONIX) 40 MG tablet TAKE ONE (1) TABLET EACH DAY 90 tablet 2  . promethazine (PHENERGAN) 25 MG tablet 1 tablet as needed.    . timolol (TIMOPTIC) 0.5 % ophthalmic solution Place 1 drop into both eyes daily.    . traZODone (DESYREL) 100 MG tablet Take 1 tablet by mouth at bedtime.    . TURMERIC PO Take 1 tablet by mouth daily.    . VENTOLIN HFA 108 (90 Base) MCG/ACT inhaler Inhale 1-2 puffs into the lungs every 4 (four) hours as needed for shortness of breath.     No current facility-administered medications for this visit.   Allergies:  Patient has no known allergies.   Social History: The patient  reports that she has never smoked. She has never used smokeless tobacco. She reports that she does not drink alcohol or use drugs.   ROS:  Please see the history of present illness. Otherwise, complete review of systems is positive for intermittent nausea.  All other systems are reviewed and negative.   Physical Exam: VS:  BP (!) 147/80   Pulse (!) 58   Ht 5' 4"  (1.626 m)   Wt 176 lb 9.6 oz (80.1 kg)   LMP  (LMP Unknown)   SpO2 99%   BMI 30.31 kg/m , BMI Body mass index is 30.31 kg/m.  Wt Readings from Last 3 Encounters:  03/14/19 176 lb 9.6 oz (80.1 kg)  11/26/18 179 lb 3.2 oz (81.3 kg)  11/13/18 186 lb 6.4 oz (84.6 kg)    General: Patient appears comfortable at rest. HEENT: Conjunctiva and lids normal, wearing a mask. Neck: Supple, no elevated JVP or carotid bruits, no thyromegaly. Lungs: Clear to auscultation, nonlabored breathing at rest. Cardiac: Regular rate and rhythm, no S3 or significant systolic murmur, no pericardial  rub. Abdomen: Bowel sounds present. Extremities: No pitting edema, distal pulses 2+.  ECG:  An ECG dated 11/11/2018 was personally reviewed today and demonstrated:  Sinus rhythm with PAC, borderline low voltage in the limb leads.  Recent Labwork: 11/13/2018: ALT 115; AST 99; TSH 1.134 02/03/2019: BUN 9; Creatinine, Ser 0.84; Hemoglobin 13.7; Platelets 252; Potassium 4.0; Sodium 141     Component Value Date/Time   CHOL 198 12/17/2017 0943   TRIG 154 (H) 12/17/2017 7341  HDL 40 (L) 12/17/2017 0943   CHOLHDL 5.0 12/17/2017 0943   VLDL 31 12/17/2017 0943   LDLCALC 127 (H) 12/17/2017 0943    Other Studies Reviewed Today:  Cardiac monitor 10/07/2018: Sinus rhythm Very frequent, short episodes of SVT are noted. These appear at times consistent with ectopic atrial tachycardia 307 Supraventricular Tachycardia runs occurred, the longest lasting 44.2 secs with an avg rate of 127 bpm  No atrial fibrillation is observed No AV block or pauses No sustained ventricular arrhythmias  Lexiscan Myoview 08/29/2018:  The left ventricular ejection fraction is normal (55-65%).  Nuclear stress EF: 62%.  There was no ST segment deviation noted during stress.  The study is normal.  This is a low risk study.  Normal stress nuclear study with no ischemia or infarction. Gated ejection fraction 62% with normal wall motion.  Echocardiogram 12/18/2017: Study Conclusions  - Left ventricle: The cavity size was normal. Wall thickness was increased in a pattern of moderate LVH. Systolic function was vigorous. The estimated ejection fraction was in the range of 65% to 70%. Wall motion was normal; there were no regional wall motion abnormalities. The study is not technically sufficient to allow evaluation of LV diastolic function. - Aortic valve: Mildly calcified annulus. Trileaflet; mildly thickened leaflets. Valve area (VTI): 3.26 cm^2. Valve area (Vmax): 3.2 cm^2. - Mitral valve:  Mildly calcified annulus. Mildly thickened leaflets. - Left atrium: The atrium was mildly dilated. - Atrial septum: No defect or patent foramen ovale was identified. - Pulmonary arteries: Systolic pressure was mildly increased. PA peak pressure: 31 mm Hg (S).  Assessment and Plan:  1.  Paroxysmal to persistent atrial fibrillation.  CHA2DS2-VASc score is 6.  She has had good symptom control so far on amiodarone, Cardizem CD, Toprol-XL, and Eliquis.  No reported bleeding episodes.  We will obtain CBC, TSH, and CMET for visit in 6 months.  2.  PSVT by cardiac monitor.  Also likely well suppressed on above regimen.  3.  Essential hypertension, no changes made to present regimen.  Systolic is in the 564P today.  4.  CAD status post DES to the LAD in 2010 as well as 2017.  Follow-up Myoview from July of last year was low risk.  Currently not on aspirin given use of Eliquis.  She had been on Zocor in the past but discontinued it.  We will readdress alternate statin therapy going forward.  Medication Adjustments/Labs and Tests Ordered: Current medicines are reviewed at length with the patient today.  Concerns regarding medicines are outlined above.   Tests Ordered: No orders of the defined types were placed in this encounter.   Medication Changes: Meds ordered this encounter  Medications  . metoprolol tartrate (LOPRESSOR) 100 MG tablet    Sig: Take 1 tablet (100 mg total) by mouth 2 (two) times daily.    Dispense:  180 tablet    Refill:  3  . apixaban (ELIQUIS) 5 MG TABS tablet    Sig: Take 1 tablet (5 mg total) by mouth 2 (two) times daily.    Dispense:  56 tablet    Refill:  0    Lot # [id number] Exp 02-2021    Disposition:  Follow up 6 months in the [place] office.  Signed, Satira Sark, MD, [facility] 03/14/2019 4:08 PM    [facility] [place], [place], [place] [address] Phone: [phone]; Fax: [phone]

## 2019-04-14 ENCOUNTER — Other Ambulatory Visit: Payer: Self-pay | Admitting: *Deleted

## 2019-04-14 MED ORDER — APIXABAN 5 MG PO TABS: 5.0000 mg | ORAL_TABLET | Freq: Two times a day (BID) | ORAL | 0 refills | Status: DC

## 2019-04-14 NOTE — Telephone Encounter (Signed)
Request eliquis samples. Can not afford $56/mth for eliquis. Advised that patient assistance paperwork will be available to fill out when she picks up samples.Advised to bring proof of income and out of pocket expenses for 2021.

## 2019-05-20 ENCOUNTER — Other Ambulatory Visit: Payer: Self-pay | Admitting: Cardiology

## 2019-06-11 ENCOUNTER — Telehealth: Payer: Self-pay | Admitting: Cardiology

## 2019-06-11 NOTE — Telephone Encounter (Signed)
Spoke with [employer] who would not give any pt info since application was submitted by pcp - spoke back with pt who will bring by application to office so that we could submit correct info

## 2019-06-11 NOTE — Telephone Encounter (Signed)
Pt received phone number from [place] pt asst[facility] [employer] asking her for her Medicare #, pt did not feel comfortable giving this information over the phone.

## 2019-07-01 ENCOUNTER — Telehealth: Payer: Self-pay | Admitting: *Deleted

## 2019-07-01 DIAGNOSIS — I10 Essential (primary) hypertension: Secondary | ICD-10-CM

## 2019-07-01 DIAGNOSIS — Z79899 Other long term (current) drug therapy: Secondary | ICD-10-CM

## 2019-07-01 DIAGNOSIS — I4819 Other persistent atrial fibrillation: Secondary | ICD-10-CM

## 2019-07-01 NOTE — Telephone Encounter (Signed)
-----   Message from Merlene Laughter, RN sent at 03/17/2019  8:30 AM EST ----- Regarding: need orders for cmet/cbc & tsh when this comes Due before ov in 6 mths

## 2019-07-01 NOTE — Telephone Encounter (Signed)
Informed that lab is due before 09/12/19 visit and says she will have lab work done at [facility] a few days before visit. Lab orders faxed.

## 2019-08-12 ENCOUNTER — Other Ambulatory Visit (HOSPITAL_COMMUNITY)
Admission: RE | Admit: 2019-08-12 | Discharge: 2019-08-12 | Disposition: A | Discharge status: Home / Self Care | Payer: Medicare Other | Source: Ambulatory Visit | Attending: Cardiology | Admitting: Cardiology

## 2019-08-12 DIAGNOSIS — I10 Essential (primary) hypertension: Secondary | ICD-10-CM | POA: Diagnosis present

## 2019-08-12 DIAGNOSIS — Z79899 Other long term (current) drug therapy: Secondary | ICD-10-CM | POA: Diagnosis present

## 2019-08-12 DIAGNOSIS — I4819 Other persistent atrial fibrillation: Secondary | ICD-10-CM | POA: Insufficient documentation

## 2019-08-12 LAB — CBC
HCT: 38.4 % (ref 36.0–46.0)
Hemoglobin: 12.6 g/dL (ref 12.0–15.0)
MCH: 29.8 pg (ref 26.0–34.0)
MCHC: 32.8 g/dL (ref 30.0–36.0)
MCV: 90.8 fL (ref 80.0–100.0)
Platelets: 207 10*3/uL (ref 150–400)
RBC: 4.23 MIL/uL (ref 3.87–5.11)
RDW: 12.5 % (ref 11.5–15.5)
WBC: 5.7 10*3/uL (ref 4.0–10.5)
nRBC: 0 % (ref 0.0–0.2)

## 2019-08-12 LAB — COMPREHENSIVE METABOLIC PANEL
ALT: 20 U/L (ref 0–44)
AST: 18 U/L (ref 15–41)
Albumin: 4.1 g/dL (ref 3.5–5.0)
Alkaline Phosphatase: 78 U/L (ref 38–126)
Anion gap: 10 (ref 5–15)
BUN: 13 mg/dL (ref 8–23)
CO2: 26 mmol/L (ref 22–32)
Calcium: 9.3 mg/dL (ref 8.9–10.3)
Chloride: 101 mmol/L (ref 98–111)
Creatinine, Ser: 0.8 mg/dL (ref 0.44–1.00)
GFR calc Af Amer: >60 mL/min (ref >60)
GFR calc non Af Amer: >60 mL/min (ref >60)
Glucose, Bld: 103 mg/dL — ABNORMAL HIGH (ref 70–99)
Potassium: 3.8 mmol/L (ref 3.5–5.1)
Sodium: 137 mmol/L (ref 135–145)
Total Bilirubin: 0.4 mg/dL (ref 0.3–1.2)
Total Protein: 7.3 g/dL (ref 6.5–8.1)

## 2019-08-12 LAB — TSH: TSH: 5.29 u[IU]/mL — ABNORMAL HIGH (ref 0.350–4.500)

## 2019-08-15 ENCOUNTER — Telehealth: Payer: Self-pay | Admitting: *Deleted

## 2019-08-15 DIAGNOSIS — I4819 Other persistent atrial fibrillation: Secondary | ICD-10-CM

## 2019-08-15 DIAGNOSIS — Z79899 Other long term (current) drug therapy: Secondary | ICD-10-CM

## 2019-08-15 MED ORDER — AMIODARONE HCL 200 MG PO TABS: 100.0000 mg | ORAL_TABLET | Freq: Every day | ORAL | 2 refills | Status: DC

## 2019-08-15 NOTE — Telephone Encounter (Signed)
Patient informed and verbalized understanding of plan. Copy sent to PCP Will pick up copy of lab order at upcoming visit.

## 2019-08-15 NOTE — Telephone Encounter (Signed)
-----   Message from Satira Sark, MD sent at 08/12/2019  6:59 PM EDT ----- Results reviewed.  Renal function, LFTs, and hemoglobin are normal.  TSH mildly increased at 5.29, up from 1.13.  Suggest decrease amiodarone to 100 mg daily.  Recheck TSH and 6 to 8 weeks.

## 2019-08-19 LAB — CBC AND DIFFERENTIAL
HCT: 40 (ref 36–46)
Hemoglobin: 13.3 (ref 12.0–16.0)
Platelets: 209 (ref 150–399)
WBC: 5.4

## 2019-08-19 LAB — COMPREHENSIVE METABOLIC PANEL
Albumin: 4.3 (ref 3.5–5.0)
Calcium: 9.7 (ref 8.7–10.7)
GFR calc Af Amer: 69
GFR calc non Af Amer: 60
Globulin: 2

## 2019-08-19 LAB — BASIC METABOLIC PANEL
BUN: 12 (ref 4–21)
CO2: 23 — AB (ref 13–22)
Chloride: 99 (ref 99–108)
Creatinine: 0.9 (ref 0.5–1.1)
Glucose: 147
Potassium: 4.2 (ref 3.4–5.3)
Sodium: 136 — AB (ref 137–147)

## 2019-08-19 LAB — HEPATIC FUNCTION PANEL
ALT: 13 (ref 7–35)
AST: 16 (ref 13–35)
Alkaline Phosphatase: 80 (ref 25–125)
Bilirubin, Total: 0.3

## 2019-08-19 LAB — CBC: RBC: 4.46 (ref 3.87–5.11)

## 2019-08-19 LAB — HEMOGLOBIN A1C: Hemoglobin A1C: 5.7

## 2019-09-12 ENCOUNTER — Other Ambulatory Visit: Payer: Self-pay

## 2019-09-12 ENCOUNTER — Ambulatory Visit (INDEPENDENT_AMBULATORY_CARE_PROVIDER_SITE_OTHER): Payer: Medicare Other | Admitting: Cardiology

## 2019-09-12 ENCOUNTER — Encounter: Payer: Self-pay | Admitting: Cardiology

## 2019-09-12 VITALS — BP 170/60 | HR 56 | Ht 64.0 in | Wt 165.4 lb

## 2019-09-12 DIAGNOSIS — I25119 Atherosclerotic heart disease of native coronary artery with unspecified angina pectoris: Secondary | ICD-10-CM | POA: Diagnosis not present

## 2019-09-12 DIAGNOSIS — I4819 Other persistent atrial fibrillation: Secondary | ICD-10-CM

## 2019-09-12 DIAGNOSIS — E782 Mixed hyperlipidemia: Secondary | ICD-10-CM

## 2019-09-12 NOTE — Progress Notes (Signed)
Cardiology Office Note  Date: 09/12/2019   ID: [name], DOB January 04, 1941, MRN [id number]  PCP:  Adaline Sill, NP  Cardiologist:  Rozann Lesches, MD Electrophysiologist:  None   Chief Complaint  Patient presents with   Cardiac follow-up    History of Present Illness: [name] is a 79 y.o. female last seen in January.  She presents for a routine visit.  Reports recent URI symptoms, slowly resolving, cough is improved, no fevers or chills.  Still somewhat congested.  She has had some palpitations during this time but her heart rate is regular today.  Interval lab work in June as outlined below.  Renal function LFTs and hemoglobin were normal, but TSH was mildly increased at 5.29.  Amiodarone was reduced to 100 mg daily.  We discussed getting her lipid panel with her next follow-up lab work.  She had been on Zocor in the past but this was discontinued.  With use of amiodarone would likely pick a different statin at this time.  Past Medical History:  Diagnosis Date   Anxiety    Arthritis    Atrial fibrillation (HCC)    Bulging lumbar disc    Chronic lower back pain    Coronary atherosclerosis of native coronary artery    DES LAD 02/2008, DES LAD 02/2015   Depression    Dyslipidemia    Essential hypertension    Facial numbness    Frequent headaches    Glaucoma    Hypertension    Paroxysmal atrial fibrillation [facility])    Diagnosed October 2019   PSVT (paroxysmal supraventricular tachycardia) (HCC)    Refusal of blood transfusions as patient is Jehovah's Witness    Type 2 diabetes mellitus ([place])    Borderline    Past Surgical History:  Procedure Laterality Date   CARDIAC CATHETERIZATION N/A 02/23/2015   Procedure: Left Heart Cath and Coronary Angiography;  Surgeon: Sherren Mocha, MD;  Location: [place] [facility];  Service: Cardiovascular;  Laterality: N/A;   CARDIAC CATHETERIZATION N/A 02/23/2015   Procedure: Coronary Stent Intervention;   Surgeon: Sherren Mocha, MD;  Location: [place] [facility];  Service: Cardiovascular;  Laterality: N/A;   CARDIAC CATHETERIZATION N/A 01/04/2016   Procedure: Left Heart Cath and Coronary Angiography;  Surgeon: Peter M Martinique, MD;  Location: [place] [facility];  Service: Cardiovascular;  Laterality: N/A;   CORONARY ANGIOPLASTY  02/23/2015   CORONARY ANGIOPLASTY WITH STENT PLACEMENT  2009   DILATION AND CURETTAGE OF UTERUS     TUBAL LIGATION      Current Outpatient Medications  Medication Sig Dispense Refill   acetaminophen (TYLENOL) 650 MG CR tablet Take 1,300 mg by mouth every 8 (eight) hours as needed for pain.     amiodarone (PACERONE) 200 MG tablet Take 0.5 tablets (100 mg total) by mouth daily. 45 tablet 2   apixaban (ELIQUIS) 5 MG TABS tablet Take 1 tablet (5 mg total) by mouth 2 (two) times daily. 56 tablet 0   Ascorbic Acid (VITAMIN C) 500 MG tablet Take 500 mg by mouth daily.       bimatoprost (LUMIGAN) 0.01 % SOLN Place 1 drop into both eyes at bedtime.     blood glucose meter kit and supplies Dispense based on patient and insurance preference. Use up to four times daily as directed. (FOR ICD-10 E10.9, E11.9). 1 each 0   Calcium Carbonate Antacid (TUMS PO) Take 2 tablets by mouth 4 (four) times daily as needed (for acid  reflux/indigestion).      Cholecalciferol (VITAMIN D) 400 UNITS capsule Take 400 Units by mouth daily.       Cyanocobalamin (VITAMIN B 12 PO) Take 1 tablet by mouth daily.     diltiazem (CARDIZEM CD) 180 MG 24 hr capsule TAKE ONE (1) CAPSULE EACH DAY 90 capsule 3   Garlic 798 MG TABS Take 1 tablet by mouth daily.       HYDROcodone-acetaminophen (NORCO) 10-325 MG tablet Take 1 tablet by mouth 4 (four) times daily as needed. For pain.     hydrocortisone 2.5 % cream Apply 2 (two) times daily topically. 30 g 0   ibuprofen (ADVIL,MOTRIN) 200 MG tablet Take 600 mg by mouth every 6 (six) hours as needed (for pain.).      MAGNESIUM CARBONATE PO Take 1  tablet by mouth daily. 400 mg     Menthol, Topical Analgesic, (BIOFREEZE) 4 % GEL Apply 1 application topically 4 (four) times daily as needed (for pain).      metFORMIN (GLUCOPHAGE-XR) 500 MG 24 hr tablet Take 2 tablets by mouth daily.     metoprolol tartrate (LOPRESSOR) 100 MG tablet Take 1 tablet (100 mg total) by mouth 2 (two) times daily. 180 tablet 3   nitroGLYCERIN (NITROSTAT) 0.4 MG SL tablet Place 0.4 mg under the tongue every 5 (five) minutes as needed for chest pain.     pantoprazole (PROTONIX) 40 MG tablet TAKE ONE (1) TABLET EACH DAY 90 tablet 2   promethazine (PHENERGAN) 25 MG tablet 1 tablet as needed.     timolol (TIMOPTIC) 0.5 % ophthalmic solution Place 1 drop into both eyes daily.     traZODone (DESYREL) 100 MG tablet Take 1 tablet by mouth at bedtime.     TURMERIC PO Take 1 tablet by mouth daily.     VENTOLIN HFA 108 (90 Base) MCG/ACT inhaler Inhale 1-2 puffs into the lungs every 4 (four) hours as needed for shortness of breath.     No current facility-administered medications for this visit.   Allergies:  Patient has no known allergies.   ROS:   Recent URI symptoms as noted.  Physical Exam: VS:  BP (!) 170/60    Pulse 56    Ht 5' 4"  (1.626 m)    Wt 165 lb 6.4 oz (75 kg)    LMP  (LMP Unknown)    SpO2 98%    BMI 28.39 kg/m , BMI Body mass index is 28.39 kg/m.  Wt Readings from Last 3 Encounters:  09/12/19 165 lb 6.4 oz (75 kg)  03/14/19 176 lb 9.6 oz (80.1 kg)  11/26/18 179 lb 3.2 oz (81.3 kg)    General: Patient appears comfortable at rest. HEENT: Conjunctiva and lids normal, oropharynx clear with moist mucosa. Neck: Supple, no elevated JVP or carotid bruits, no thyromegaly. Lungs: Scattered rhonchi without wheeze, nonlabored breathing at rest. Cardiac: Regular rate and rhythm, no S3, soft systolic murmur, no pericardial rub. Extremities: No pitting edema, distal pulses 2+.  ECG:  An ECG dated 11/11/2018 was personally reviewed today and demonstrated:   Sinus rhythm with PAC, borderline low voltage in the limb leads.  Recent Labwork: 08/12/2019: ALT 20; AST 18; BUN 13; Creatinine, Ser 0.80; Hemoglobin 12.6; Platelets 207; Potassium 3.8; Sodium 137; TSH 5.290     Component Value Date/Time   CHOL 198 12/17/2017 0943   TRIG 154 (H) 12/17/2017 0943   HDL 40 (L) 12/17/2017 0943   CHOLHDL 5.0 12/17/2017 0943   VLDL 31 12/17/2017 0943  LDLCALC 127 (H) 12/17/2017 0298    Other Studies Reviewed Today:  Cardiac monitor 10/07/2018: Sinus rhythm Very frequent, short episodes of SVT are noted. These appear at times consistent with ectopic atrial tachycardia 307 Supraventricular Tachycardia runs occurred, the longest lasting 44.2 secs with an avg rate of 127 bpm  No atrial fibrillation is observed No AV block or pauses No sustained ventricular arrhythmias  Lexiscan Myoview 08/29/2018:  The left ventricular ejection fraction is normal (55-65%).  Nuclear stress EF: 62%.  There was no ST segment deviation noted during stress.  The study is normal.  This is a low risk study.  Normal stress nuclear study with no ischemia or infarction. Gated ejection fraction 62% with normal wall motion.  Echocardiogram 12/18/2017: Study Conclusions  - Left ventricle: The cavity size was normal. Wall thickness was increased in a pattern of moderate LVH. Systolic function was vigorous. The estimated ejection fraction was in the range of 65% to 70%. Wall motion was normal; there were no regional wall motion abnormalities. The study is not technically sufficient to allow evaluation of LV diastolic function. - Aortic valve: Mildly calcified annulus. Trileaflet; mildly thickened leaflets. Valve area (VTI): 3.26 cm^2. Valve area (Vmax): 3.2 cm^2. - Mitral valve: Mildly calcified annulus. Mildly thickened leaflets. - Left atrium: The atrium was mildly dilated. - Atrial septum: No defect or patent foramen ovale was identified. -  Pulmonary arteries: Systolic pressure was mildly increased. PA peak pressure: 31 mm Hg (S).  Assessment and Plan:  1.  Paroxysmal to persistent atrial fibrillation with CHA2DS2-VASc score of 6.  Continue Cardizem CD, Toprol-XL, and Eliquis at current doses.  Amiodarone was reduced to 100 mg daily.  Follow-up TSH in 4 weeks.  2.  CAD status post DES to the LAD in 2010 as well as 2017.  She does not report any active angina at this time with low risk Myoview documented in July 2020.  3.  Mixed hyperlipidemia with history of intolerance to Zocor.  Follow-up FLP with next lab draw and can discuss different statin medication.  Medication Adjustments/Labs and Tests Ordered: Current medicines are reviewed at length with the patient today.  Concerns regarding medicines are outlined above.   Tests Ordered: Orders Placed This Encounter  Procedures   Lipid panel    Medication Changes: No orders of the defined types were placed in this encounter.   Disposition:  Follow up 6 months in the [place] office.  Signed, Satira Sark, MD, [facility] 09/12/2019 11:06 AM    [facility] at [place], [place], [place] [address] Phone: [phone]; Fax: [phone]

## 2019-09-12 NOTE — Patient Instructions (Addendum)
Medication Instructions:    Your physician recommends that you continue on your current medications as directed. Please refer to the Current Medication list given to you today.  Labwork: Your physician recommends that you return for a FASTING lipid profile and TSH in one month. Please do not eat or drink for at least 8 hours when you have this done. You may take your medications that morning with a sip of water. This may be done at [facility] or [facility] Monday-Friday from 8:00 am - 4:00 pm. No appointment is needed.  Testing/Procedures:  NONE  Follow-Up:  Your physician recommends that you schedule a follow-up appointment in: 6 months (office). You will receive a reminder letter in the mail in about 4 months reminding you to call and schedule your appointment. If you don't receive this letter, please contact our office.  Any Other Special Instructions Will Be Listed Below (If Applicable).  If you need a refill on your cardiac medications before your next appointment, please call your pharmacy.

## 2019-10-02 ENCOUNTER — Encounter: Payer: Self-pay | Admitting: Nurse Practitioner

## 2019-10-02 ENCOUNTER — Other Ambulatory Visit: Payer: Self-pay

## 2019-10-02 ENCOUNTER — Ambulatory Visit (INDEPENDENT_AMBULATORY_CARE_PROVIDER_SITE_OTHER): Payer: Medicare Other | Admitting: Nurse Practitioner

## 2019-10-02 VITALS — BP 161/60 | HR 50 | Temp 97.4°F | Ht 64.0 in | Wt 165.6 lb

## 2019-10-02 DIAGNOSIS — F418 Other specified anxiety disorders: Secondary | ICD-10-CM | POA: Insufficient documentation

## 2019-10-02 DIAGNOSIS — Z0000 Encounter for general adult medical examination without abnormal findings: Secondary | ICD-10-CM | POA: Insufficient documentation

## 2019-10-02 DIAGNOSIS — Z0001 Encounter for general adult medical examination with abnormal findings: Secondary | ICD-10-CM

## 2019-10-02 DIAGNOSIS — R200 Anesthesia of skin: Secondary | ICD-10-CM

## 2019-10-02 MED ORDER — MAGIC MOUTHWASH W/LIDOCAINE: 5.0000 mL | Freq: Three times a day (TID) | ORAL | 0 refills | Status: DC

## 2019-10-02 MED ORDER — AMITRIPTYLINE HCL 10 MG PO TABS: 10.0000 mg | ORAL_TABLET | Freq: Every day | ORAL | 0 refills | Status: DC

## 2019-10-02 NOTE — Assessment & Plan Note (Signed)
Patient continues to report pain in tongue with numbness.  This is an ongoing recurrent problem in the last 1 year.  Patient reports pain in her tongue is progressively worsening.  Patient has seen neurology, ENT, and her dentist for the same problem with no resolution.  Patient is reporting that pain in her tongue has caused her not to eat properly and losing weight, patient reports her food does not taste right. Started patient on Magic mouthwash with lidocaine 3 times daily.  Amitriptyline 10 mg daily.  Medication will be reviewed with pharmacy and necessary adjustments will be made. Rx sent to pharmacy Follow-up in 4 to 6 weeks.

## 2019-10-02 NOTE — Progress Notes (Signed)
New Patient Office Visit  Subjective:  Patient ID: [name], female    DOB: 24-Jul-1940  Age: 79 y.o. MRN: [id number]  CC:  Chief Complaint  Patient presents with  . Establish Care    HPI [name] presents for .   Encounter for general adult medical examination without abnormal findings  Physical : Patient's last physical exam was 1 year ago .  Weight: Not appropriate for height (BMI greater than 27%) ;  Blood Pressure: Abnormal (BP greater than 120/80) ;  Medical History: Patient history reviewed ; Family history reviewed ;  Allergies Reviewed: No change in current allergies ;  Medications Reviewed: Medications reviewed - no changes ;  Lipids: Normal lipid levels ;  Smoking: Life-long non-smoker ;  Physical Activity: Exercises at least  2 times per week ;  Alcohol/Drug Use: Is a non-drinker ; No illicit drug use ;  Patient is not afflicted from Stress Incontinence and Urge Incontinence  Safety: reviewed ; Patient wears a seat belt, has smoke detectors, has carbon monoxide detectors, practices appropriate gun safety, and wears sunscreen with extended sun exposure. Dental Care: biannual cleanings, brushes and flosses daily. Ophthalmology/Optometry: Annual visit.  Hearing loss: none Vision impairments: none  Past Medical History:  Diagnosis Date  . Anxiety   . Arthritis   . Atrial fibrillation ([place])   . Bulging lumbar disc   . Chronic lower back pain   . Coronary atherosclerosis of native coronary artery    DES LAD 02/2008, DES LAD 02/2015  . Depression   . Dyslipidemia   . Essential hypertension   . Facial numbness   . Frequent headaches   . Glaucoma   . Hypertension   . Paroxysmal atrial fibrillation [facility])    Diagnosed October 2019  . PSVT (paroxysmal supraventricular tachycardia) ([place])   . Refusal of blood transfusions as patient is Jehovah's Witness   . Type 2 diabetes mellitus (HCC)    Borderline    Past Surgical History:  Procedure Laterality  Date  . CARDIAC CATHETERIZATION N/A 02/23/2015   Procedure: Left Heart Cath and Coronary Angiography;  Surgeon: Sherren Mocha, MD;  Location: [place] [facility];  Service: Cardiovascular;  Laterality: N/A;  . CARDIAC CATHETERIZATION N/A 02/23/2015   Procedure: Coronary Stent Intervention;  Surgeon: Sherren Mocha, MD;  Location: [place] [facility];  Service: Cardiovascular;  Laterality: N/A;  . CARDIAC CATHETERIZATION N/A 01/04/2016   Procedure: Left Heart Cath and Coronary Angiography;  Surgeon: Peter M Martinique, MD;  Location: [place] [facility];  Service: Cardiovascular;  Laterality: N/A;  . CORONARY ANGIOPLASTY  02/23/2015  . CORONARY ANGIOPLASTY WITH STENT PLACEMENT  2009  . DILATION AND CURETTAGE OF UTERUS    . TUBAL LIGATION      Family History  Problem Relation Age of Onset  . Other Mother        "natural causes"  . Cancer Father        unsure of type    Social History   Socioeconomic History  . Marital status: Widowed    Spouse name: Not on file  . Number of children: 8  . Years of education: 10th grade  . Highest education level: Not on file  Occupational History  . Occupation: RETIRED  Tobacco Use  . Smoking status: Never Smoker  . Smokeless tobacco: Never Used  Vaping Use  . Vaping Use: Never used  Substance and Sexual Activity  . Alcohol use: No    Alcohol/week: 0.0 standard drinks  Comment: RARE OCCASION  . Drug use: No  . Sexual activity: Never  Other Topics Concern  . Not on file  Social History Narrative   Lives at home alone.   Right-handed.   No caffeine use.   Social Determinants of Health   Financial Resource Strain:   . Difficulty of Paying Living Expenses:   Food Insecurity:   . Worried About Charity fundraiser in the Last Year:   . Arboriculturist in the Last Year:   Transportation Needs:   . Film/video editor (Medical):   Marland Kitchen Lack of Transportation (Non-Medical):   Physical Activity:   . Days of Exercise per Week:   . Minutes of  Exercise per Session:   Stress:   . Feeling of Stress :   Social Connections:   . Frequency of Communication with Friends and Family:   . Frequency of Social Gatherings with Friends and Family:   . Attends Religious Services:   . Active Member of Clubs or Organizations:   . Attends Archivist Meetings:   Marland Kitchen Marital Status:   Intimate Partner Violence:   . Fear of Current or Ex-Partner:   . Emotionally Abused:   Marland Kitchen Physically Abused:   . Sexually Abused:     ROS Review of Systems  Constitutional: Negative.   HENT:       Tongue pain and numbness  Respiratory: Negative.   Gastrointestinal: Negative.   Genitourinary: Negative.   Musculoskeletal: Negative.   Skin: Negative.   Neurological: Negative.   Psychiatric/Behavioral: Negative.     Objective:   Today's Vitals: BP (!) 161/60   Pulse (!) 50   Temp (!) 97.4 F (36.3 C)   Ht '5\' 4"'$  (1.626 m)   Wt 165 lb 9.6 oz (75.1 kg)   LMP  (LMP Unknown)   SpO2 95%   BMI 28.43 kg/m   Physical Exam Constitutional:      Appearance: Normal appearance.  HENT:     Head: Normocephalic.     Nose:     Comments: Tongue burning and numbness    Mouth/Throat:     Mouth: Mucous membranes are moist.  Eyes:     Conjunctiva/sclera: Conjunctivae normal.  Cardiovascular:     Rate and Rhythm: Normal rate and regular rhythm.  Pulmonary:     Effort: Pulmonary effort is normal.     Breath sounds: Normal breath sounds.  Abdominal:     General: Bowel sounds are normal.  Neurological:     Mental Status: She is alert.    GAD 7 : Generalized Anxiety Score 10/02/2019  Nervous, Anxious, on Edge 2  Control/stop worrying 3  Worry too much - different things 3  Trouble relaxing 3  Restless 3  Easily annoyed or irritable 1  Afraid - awful might happen 1  Total GAD 7 Score 16  Anxiety Difficulty Somewhat difficult      Office Visit from 10/02/2019 in [place]  PHQ-9 Total Score 14    Annual physical  exam Patient is a 79 year old female who presents to clinic today for annual physical exam and establishing care.  Patient is reporting numbness and pain in her tongue which has been ongoing for 1 year.  Nothing has helped alleviate pain.  Patient  reports need for adjusting current medication due to too many medication been taking on a daily basis.  Patient also presents with depression and anxiety.  Patient at this time reports that depression with anxiety  is facilitated by being sick and not really finding any therapeutic medication to help alleviate symptoms.  Completed head to toe assessment, provided education to patient.  Health maintenance over the age of 38. Appointment made with [facility]ist to go over patient medication and provide education on Tuesday, 10/07/2019. Printed handout given to patient.   Numbness of tongue Patient continues to report pain in tongue with numbness.  This is an ongoing recurrent problem in the last 1 year.  Patient reports pain in her tongue is progressively worsening.  Patient has seen neurology, ENT, and her dentist for the same problem with no resolution.  Patient is reporting that pain in her tongue has caused her not to eat properly and losing weight, patient reports her food does not taste right. Started patient on Magic mouthwash with lidocaine 3 times daily.  Amitriptyline 10 mg daily.  Medication will be reviewed with pharmacy and necessary adjustments will be made. Rx sent to pharmacy Follow-up in 4 to 6 weeks.  Anxiety with depression Patient is a 79 year old female who presents to clinic today for annual wellness check and establishing care.  Patient is reporting anxiety with depression.  Completed PHQ-9 total score 14, GAD 7 total score 16.  Discussed results with patient. patient verbalized understanding but believes current depression and anxiety is caused by her disease and not being able to find resolution to her health.  Provided  education with printed handouts given to patient.  Patient reports increased anxiety, worrying and insomnia.  Patient denies suicidal ideation.  Denies self-harm and to others. Patient follow-up in 4 to 6 weeks.  Assessment & Plan:   Problem List Items Addressed This Visit    None      Outpatient Encounter Medications as of 10/02/2019  Medication Sig  . acetaminophen (TYLENOL) 650 MG CR tablet Take 1,300 mg by mouth every 8 (eight) hours as needed for pain.  Marland Kitchen amiodarone (PACERONE) 200 MG tablet Take 0.5 tablets (100 mg total) by mouth daily.  Marland Kitchen apixaban (ELIQUIS) 5 MG TABS tablet Take 1 tablet (5 mg total) by mouth 2 (two) times daily.  . Ascorbic Acid (VITAMIN C) 500 MG tablet Take 500 mg by mouth daily.    . bimatoprost (LUMIGAN) 0.01 % SOLN Place 1 drop into both eyes at bedtime.  . blood glucose meter kit and supplies Dispense based on patient and insurance preference. Use up to four times daily as directed. (FOR ICD-10 E10.9, E11.9).  . Calcium Carbonate Antacid (TUMS PO) Take 2 tablets by mouth 4 (four) times daily as needed (for acid reflux/indigestion).   . Cholecalciferol (VITAMIN D) 400 UNITS capsule Take 400 Units by mouth daily.    . Cyanocobalamin (VITAMIN B 12 PO) Take 1 tablet by mouth daily.  Marland Kitchen diltiazem (CARDIZEM CD) 180 MG 24 hr capsule TAKE ONE (1) CAPSULE EACH DAY  . Garlic 295 MG TABS Take 1 tablet by mouth daily.    Marland Kitchen HYDROcodone-acetaminophen (NORCO) 10-325 MG tablet Take 1 tablet by mouth 4 (four) times daily as needed. For pain.  . hydrocortisone 2.5 % cream Apply 2 (two) times daily topically.  Marland Kitchen ibuprofen (ADVIL,MOTRIN) 200 MG tablet Take 600 mg by mouth every 6 (six) hours as needed (for pain.).   Marland Kitchen MAGNESIUM CARBONATE PO Take 1 tablet by mouth daily. 400 mg  . Menthol, Topical Analgesic, (BIOFREEZE) 4 % GEL Apply 1 application topically 4 (four) times daily as needed (for pain).   . metFORMIN (GLUCOPHAGE-XR) 500 MG 24 hr  tablet Take 2 tablets by mouth daily.   . metoprolol tartrate (LOPRESSOR) 100 MG tablet Take 1 tablet (100 mg total) by mouth 2 (two) times daily.  . nitroGLYCERIN (NITROSTAT) 0.4 MG SL tablet Place 0.4 mg under the tongue every 5 (five) minutes as needed for chest pain.  . pantoprazole (PROTONIX) 40 MG tablet TAKE ONE (1) TABLET EACH DAY  . promethazine (PHENERGAN) 25 MG tablet 1 tablet as needed.  . timolol (TIMOPTIC) 0.5 % ophthalmic solution Place 1 drop into both eyes daily.  . traZODone (DESYREL) 100 MG tablet Take 1 tablet by mouth at bedtime.  . TURMERIC PO Take 1 tablet by mouth daily.  . VENTOLIN HFA 108 (90 Base) MCG/ACT inhaler Inhale 1-2 puffs into the lungs every 4 (four) hours as needed for shortness of breath.   No facility-administered encounter medications on file as of 10/02/2019.    Follow-up: Return in about 1 year (around 10/01/2020).   Ivy Lynn, NP

## 2019-10-02 NOTE — Assessment & Plan Note (Addendum)
Patient is a 79 year old female who presents to clinic today for annual wellness check and establishing care.  Patient is reporting anxiety with depression.  Completed PHQ-9 total score 14, GAD 7 total score 16.  Discussed results with patient. patient verbalized understanding but believes current depression and anxiety is caused by her disease and not being able to find resolution to her health.  Provided education with printed handouts given to patient.  Patient reports increased anxiety, worrying and insomnia.  Patient denies suicidal ideation.  Denies self-harm and to others. Started patient on amitriptyline 10 mg daily. Patient follow-up in 4 to 6 weeks.

## 2019-10-02 NOTE — Patient Instructions (Signed)
Health Maintenance After Age 79 After age 79, you are at a higher risk for certain long-term diseases and infections as well as injuries from falls. Falls are a major cause of broken bones and head injuries in people who are older than age 79. Getting regular preventive care can help to keep you healthy and well. Preventive care includes getting regular testing and making lifestyle changes as recommended by your health care provider. Talk with your health care provider about:  Which screenings and tests you should have. A screening is a test that checks for a disease when you have no symptoms.  A diet and exercise plan that is right for you. What should I know about screenings and tests to prevent falls? Screening and testing are the best ways to find a health problem early. Early diagnosis and treatment give you the best chance of managing medical conditions that are common after age 79. Certain conditions and lifestyle choices may make you more likely to have a fall. Your health care provider may recommend:  Regular vision checks. Poor vision and conditions such as cataracts can make you more likely to have a fall. If you wear glasses, make sure to get your prescription updated if your vision changes.  Medicine review. Work with your health care provider to regularly review all of the medicines you are taking, including over-the-counter medicines. Ask your health care provider about any side effects that may make you more likely to have a fall. Tell your health care provider if any medicines that you take make you feel dizzy or sleepy.  Osteoporosis screening. Osteoporosis is a condition that causes the bones to get weaker. This can make the bones weak and cause them to break more easily.  Blood pressure screening. Blood pressure changes and medicines to control blood pressure can make you feel dizzy.  Strength and balance checks. Your health care provider may recommend certain tests to check your  strength and balance while standing, walking, or changing positions.  Foot health exam. Foot pain and numbness, as well as not wearing proper footwear, can make you more likely to have a fall.  Depression screening. You may be more likely to have a fall if you have a fear of falling, feel emotionally low, or feel unable to do activities that you used to do.  Alcohol use screening. Using too much alcohol can affect your balance and may make you more likely to have a fall. What actions can I take to lower my risk of falls? General instructions  Talk with your health care provider about your risks for falling. Tell your health care provider if: ? You fall. Be sure to tell your health care provider about all falls, even ones that seem minor. ? You feel dizzy, sleepy, or off-balance.  Take over-the-counter and prescription medicines only as told by your health care provider. These include any supplements.  Eat a healthy diet and maintain a healthy weight. A healthy diet includes low-fat dairy products, low-fat (lean) meats, and fiber from whole grains, beans, and lots of fruits and vegetables. Home safety  Remove any tripping hazards, such as rugs, cords, and clutter.  Install safety equipment such as grab bars in bathrooms and safety rails on stairs.  Keep rooms and walkways well-lit. Activity   Follow a regular exercise program to stay fit. This will help you maintain your balance. Ask your health care provider what types of exercise are appropriate for you.  If you need a cane or   walker, use it as recommended by your health care provider.  Wear supportive shoes that have nonskid soles. Lifestyle  Do not drink alcohol if your health care provider tells you not to drink.  If you drink alcohol, limit how much you have: ? 0-1 drink a day for women. ? 0-2 drinks a day for men.  Be aware of how much alcohol is in your drink. In the [place] one drink equals one typical bottle of beer (12  oz), one-half glass of wine (5 oz), or one shot of hard liquor (1 oz).  Do not use any products that contain nicotine or tobacco, such as cigarettes and e-cigarettes. If you need help quitting, ask your health care provider. Summary  Having a healthy lifestyle and getting preventive care can help to protect your health and wellness after age 79.  Screening and testing are the best way to find a health problem early and help you avoid having a fall. Early diagnosis and treatment give you the best chance for managing medical conditions that are more common for people who are older than age 79.  Falls are a major cause of broken bones and head injuries in people who are older than age 79. Take precautions to prevent a fall at home.  Work with your health care provider to learn what changes you can make to improve your health and wellness and to prevent falls. This information is not intended to replace advice given to you by your health care provider. Make sure you discuss any questions you have with your health care provider. Document Revised: 05/30/2018 Document Reviewed: 12/20/2016 Elsevier Patient Education  2020 Elsevier Inc.  

## 2019-10-02 NOTE — Assessment & Plan Note (Signed)
Patient is a 79 year old female who presents to clinic today for annual physical exam and establishing care.  Patient is reporting numbness and pain in her tongue which has been ongoing for 1 year.  Nothing has helped alleviate pain.  Patient  reports need for adjusting current medication due to too many medication been taking on a daily basis.  Patient also presents with depression and anxiety.  Patient at this time reports that depression with anxiety is facilitated by being sick and not really finding any therapeutic medication to help alleviate symptoms.  Completed head to toe assessment, provided education to patient.  Health maintenance over the age of 19. Appointment made with [facility] to go over patient medication and provide education on Tuesday, 10/07/2019. Printed handout given to patient.

## 2019-10-06 ENCOUNTER — Telehealth: Payer: Self-pay | Admitting: Cardiology

## 2019-10-06 DIAGNOSIS — I10 Essential (primary) hypertension: Secondary | ICD-10-CM

## 2019-10-06 MED ORDER — LOSARTAN POTASSIUM 25 MG PO TABS
25.0000 mg | ORAL_TABLET | Freq: Every day | ORAL | 1 refills | Status: DC
Start: 2019-10-06 — End: 2019-12-22

## 2019-10-06 NOTE — Telephone Encounter (Signed)
Thank you for the update.  She is already on Cardizem CD and metoprolol.  Let's start losartan 25 mg daily, continue to track blood pressure as it may need to be further uptitrated.  Check BMET in 7 to 10 days.

## 2019-10-06 NOTE — Telephone Encounter (Signed)
Pt daughter concerned with BP - denies blurred vision/dizziness - has had slight headaches - says her eyes are sensitive to light (went to eye doctor last Tuesday who says she was fine)

## 2019-10-06 NOTE — Telephone Encounter (Signed)
Pt daughter voiced understanding and will update [place] next week on BP readings and have labs done

## 2019-10-06 NOTE — Telephone Encounter (Signed)
Pt c/o BP issue:   1. What are your last 5 BP readings? 10/06/2019  194/71 P 51                                                              10/05/2019 (last PM) 193/73 P 52                                                    10/05/2019  (1230pm) 230/81 P 59                  10/05/2019  (1000 am) 173/71 56   2. Are you having any other symptoms (ex. Dizziness, headache, blurred vision, passed out)?   Headaches -had a recent eye exam (was told to get glasses)   3. What is your BP issue?  BP continues to go up and down. Patient's family concerned   States that for the past 2 weeks her right hand has become weak and hard to use.   Family is concerned as to taking her to Urgent Care   Please call [place] at [phone]

## 2019-10-07 ENCOUNTER — Ambulatory Visit (INDEPENDENT_AMBULATORY_CARE_PROVIDER_SITE_OTHER): Payer: Medicare Other | Admitting: Pharmacist

## 2019-10-07 ENCOUNTER — Other Ambulatory Visit: Payer: Self-pay

## 2019-10-07 VITALS — BP 161/73 | HR 52

## 2019-10-07 DIAGNOSIS — G720 Drug-induced myopathy: Secondary | ICD-10-CM | POA: Diagnosis not present

## 2019-10-07 DIAGNOSIS — E119 Type 2 diabetes mellitus without complications: Secondary | ICD-10-CM | POA: Diagnosis not present

## 2019-10-07 NOTE — Progress Notes (Signed)
    10/07/2019 Name: [name] MRN: [id number] DOB: 01/05/1941   S:  63 yoF presents for diabetes evaluation, education, and management Patient was referred and last seen by Primary Care Provider on 10/02/19  Insurance coverage/medication affordability: [place]  Patient reports adherence with medications. . Current diabetes medications include: metformin . Current hypertension medications include: dilt,losartan, metoprolol Goal 130/80 . Current hyperlipidemia medications include: STATIN ALLERGY/INTOLERANCE DOCUMENTED IN EMR --yes ICD:10 G72   Patient denies hypoglycemic events.   Patient reported dietary habits: Eats 2-3 meals/day The patient is asked to make an attempt to improve diet and exercise patterns to aid in medical management of this problem. Discussed meal planning options and Plate method for healthy eating . Avoid sugary drinks and desserts . Incorporate balanced protein, non starchy veggies, 1 serving of carbohydrate with each meal . Increase water intake . Increase physical activity as able  Patient-reported exercise habits: n/a  Patient reports neuropathy (nerve pain).  Patient reports visual changes.  Patient reports self foot exams.    O:  Lab Results  Component Value Date   HGBA1C 7.5 (H) 12/17/2017    There were no vitals filed for this visit.     Lipid Panel     Component Value Date/Time   CHOL 198 12/17/2017 0943   TRIG 154 (H) 12/17/2017 0943   HDL 40 (L) 12/17/2017 0943   CHOLHDL 5.0 12/17/2017 0943   VLDL 31 12/17/2017 0943   LDLCALC 127 (H) 12/17/2017 0943    Home fasting blood sugars: 100-120  2 hour post-meal/random blood sugars: n/a.   A/P:  Diabetes T2DM, A1c 7.5%. Patient  adherent with medication.  -Continue metformin and diet control  -Discussed medications in depth.  There were several OTC items that are listed in patient's chart that she does not take on a daily basis.  Labeled medications of importance for  patient.  Encouraged patient to stop taking the OTC items that may not be beneficial  -Discussed amitriptyline (new medication for patient).  This medication was prescribed for patient's neuropathy, mood and to improved sleep.  Patient will start this medication tonight.  Discussed side effects and risk of falls  -Extensively discussed pathophysiology of diabetes, recommended lifestyle interventions, dietary effects on blood sugar control  -Counseled on s/sx of and management of hypoglycemia  -Next A1C anticipated at next visit    Written patient instructions provided.  Total time in face to face counseling 30 minutes.   Follow up PCP Clinic Visit ON 10/30/19.   Regina Eck, PharmD, BCPS Clinical Pharmacist, [place]  [facility] Phone [phone]

## 2019-10-21 ENCOUNTER — Other Ambulatory Visit (HOSPITAL_COMMUNITY)
Admission: RE | Admit: 2019-10-21 | Discharge: 2019-10-21 | Disposition: A | Discharge status: Home / Self Care | Payer: Medicare Other | Source: Ambulatory Visit | Attending: Cardiology | Admitting: Cardiology

## 2019-10-21 DIAGNOSIS — I10 Essential (primary) hypertension: Secondary | ICD-10-CM | POA: Diagnosis present

## 2019-10-21 LAB — BASIC METABOLIC PANEL
Anion gap: 10 (ref 5–15)
BUN: 15 mg/dL (ref 8–23)
CO2: 26 mmol/L (ref 22–32)
Calcium: 9.3 mg/dL (ref 8.9–10.3)
Chloride: 102 mmol/L (ref 98–111)
Creatinine, Ser: 0.8 mg/dL (ref 0.44–1.00)
GFR calc Af Amer: >60 mL/min (ref >60)
GFR calc non Af Amer: >60 mL/min (ref >60)
Glucose, Bld: 124 mg/dL — ABNORMAL HIGH (ref 70–99)
Potassium: 4 mmol/L (ref 3.5–5.1)
Sodium: 138 mmol/L (ref 135–145)

## 2019-10-22 ENCOUNTER — Other Ambulatory Visit: Payer: Self-pay | Admitting: Cardiology

## 2019-10-23 ENCOUNTER — Telehealth: Payer: Self-pay | Admitting: *Deleted

## 2019-10-23 NOTE — Telephone Encounter (Signed)
Patient informed. Copy sent to PCP Advised that TSH and FLP was not done and unable to add on d/t specimen no longer available Says she will come by office to pick up orders prior to having fasting lab work

## 2019-10-23 NOTE — Telephone Encounter (Signed)
-----   Message from Satira Sark, MD sent at 10/21/2019  2:40 PM EDT ----- Results reviewed.  Renal function and potassium are normal.  We also recommended a follow-up TSH and LFTs at last visit, please make sure that this is set up.

## 2019-10-24 DIAGNOSIS — G720 Drug-induced myopathy: Secondary | ICD-10-CM | POA: Insufficient documentation

## 2019-10-28 ENCOUNTER — Encounter: Payer: Self-pay | Admitting: Family Medicine

## 2019-10-28 ENCOUNTER — Telehealth: Payer: Self-pay | Admitting: Internal Medicine

## 2019-10-28 NOTE — Telephone Encounter (Signed)
When would you like to see patient?

## 2019-10-29 NOTE — Telephone Encounter (Signed)
Appt made

## 2019-10-29 NOTE — Telephone Encounter (Signed)
Per Je's note, she was to schedule a follow-up in 4-6 weeks.

## 2019-10-30 ENCOUNTER — Ambulatory Visit: Payer: Medicare Other | Admitting: Family Medicine

## 2019-11-07 ENCOUNTER — Telehealth: Payer: Self-pay | Admitting: Cardiology

## 2019-11-07 ENCOUNTER — Other Ambulatory Visit: Payer: Self-pay | Admitting: Cardiology

## 2019-11-07 MED ORDER — APIXABAN 5 MG PO TABS: 5.0000 mg | ORAL_TABLET | Freq: Two times a day (BID) | ORAL | 11 refills | Status: DC

## 2019-11-07 NOTE — Telephone Encounter (Signed)
Would like to speak w/ someone about her Eliquis

## 2019-11-07 NOTE — Telephone Encounter (Signed)
Requesting eliquis samples d/t pharmacy being closed today and having only enough eliquis to last till tomorrow. Advised that eliquis 2.5 mg samples available and she would need to take 2 twice daily. Verbalized understanding of plan.

## 2019-11-11 ENCOUNTER — Other Ambulatory Visit: Payer: Self-pay

## 2019-11-11 ENCOUNTER — Other Ambulatory Visit (HOSPITAL_COMMUNITY)
Admission: RE | Admit: 2019-11-11 | Discharge: 2019-11-11 | Disposition: A | Discharge status: Home / Self Care | Payer: Medicare Other | Source: Ambulatory Visit | Attending: Cardiology | Admitting: Cardiology

## 2019-11-11 DIAGNOSIS — E782 Mixed hyperlipidemia: Secondary | ICD-10-CM | POA: Insufficient documentation

## 2019-11-11 LAB — LIPID PANEL
Cholesterol: 251 mg/dL — ABNORMAL HIGH (ref 0–200)
HDL: 48 mg/dL (ref >40)
LDL Cholesterol: 180 mg/dL — ABNORMAL HIGH (ref 0–99)
Total CHOL/HDL Ratio: 5.2 RATIO
Triglycerides: 113 mg/dL (ref <150)
VLDL: 23 mg/dL (ref 0–40)

## 2019-11-11 LAB — TSH: TSH: 11.769 u[IU]/mL — ABNORMAL HIGH (ref 0.350–4.500)

## 2019-11-12 ENCOUNTER — Telehealth: Payer: Self-pay | Admitting: *Deleted

## 2019-11-12 DIAGNOSIS — Z79899 Other long term (current) drug therapy: Secondary | ICD-10-CM

## 2019-11-12 DIAGNOSIS — I4819 Other persistent atrial fibrillation: Secondary | ICD-10-CM

## 2019-11-12 MED ORDER — ROSUVASTATIN CALCIUM 5 MG PO TABS: 5.0000 mg | ORAL_TABLET | Freq: Every day | ORAL | 1 refills | Status: DC

## 2019-11-12 NOTE — Telephone Encounter (Signed)
-----   Message from Satira Sark, MD sent at 11/11/2019 11:59 AM EDT ----- Results reviewed.  She has previous intolerance to Zocor.  LDL 180 and total cholesterol 251.  Let's start her on Crestor 5 mg daily and if she tolerates this we can uptitrate from there.

## 2019-11-12 NOTE — Telephone Encounter (Signed)
Patient informed and verbalized understanding of plan. Copy sent to PCP 

## 2019-11-12 NOTE — Telephone Encounter (Signed)
-----   Message from Satira Sark, MD sent at 11/11/2019  7:47 PM EDT ----- Results reviewed.  Unfortunately, TSH continues to climb, now up to 11.76 despite reduction in amiodarone to 100 mg daily.  We will go ahead and stop amiodarone altogether.  Will keep an eye out for recurring atrial fibrillation and may need to pick a different treatment option.  Recheck TSH in 6 weeks.

## 2019-11-20 ENCOUNTER — Other Ambulatory Visit: Payer: Self-pay

## 2019-11-20 ENCOUNTER — Encounter: Payer: Self-pay | Admitting: Nurse Practitioner

## 2019-11-20 ENCOUNTER — Ambulatory Visit (INDEPENDENT_AMBULATORY_CARE_PROVIDER_SITE_OTHER): Payer: Medicare Other | Admitting: Nurse Practitioner

## 2019-11-20 ENCOUNTER — Telehealth: Payer: Self-pay | Admitting: Cardiology

## 2019-11-20 VITALS — BP 137/69 | HR 61 | Temp 97.2°F | Resp 20 | Ht 64.0 in | Wt 174.0 lb

## 2019-11-20 DIAGNOSIS — R200 Anesthesia of skin: Secondary | ICD-10-CM | POA: Diagnosis not present

## 2019-11-20 DIAGNOSIS — Z23 Encounter for immunization: Secondary | ICD-10-CM | POA: Diagnosis not present

## 2019-11-20 DIAGNOSIS — F418 Other specified anxiety disorders: Secondary | ICD-10-CM | POA: Diagnosis not present

## 2019-11-20 MED ORDER — AMITRIPTYLINE HCL 10 MG PO TABS: 10.0000 mg | ORAL_TABLET | Freq: Every day | ORAL | 2 refills | Status: DC

## 2019-11-20 NOTE — Telephone Encounter (Signed)
Patient called requesting to speak with someone about her new medications.

## 2019-11-20 NOTE — Progress Notes (Signed)
Established Patient Office Visit  Subjective:  Patient ID: [name], female    DOB: 04/14/1940  Age: 79 y.o. MRN: [id number]  CC:  Chief Complaint  Patient presents with  . Follow up anxiety/depression    HPI [name] presents for Depression: Patient complains of depression. She complains of depressed mood. Onset was approximately a few weeks ago, symptoms have improved since that time.  She denies current suicidal and homicidal plan or intent.   Family history significant for no psychiatric illness.Possible organic causes contributing are: none.  Risk factors: Chronic pain Previous treatment includes Elavil and none. She complains of the following side effects from the treatment: none.  Neuropathy: She describes symptoms of numbness and tingling. Onset of symptoms was gradual, starting about a few years ago ago. Symptoms are currently of severe severity. Symptoms occur intermittently and last hours. The patient reports numbness and burning. Symptoms are symmetric. Marland Kitchen She denies  nausea, early satiety, diarrhea and constipation. Previous treatment has included Elavil, which has improved symptoms.      Past Medical History:  Diagnosis Date  . Anxiety   . Arthritis   . Bulging lumbar disc   . Chronic lower back pain   . Coronary atherosclerosis of native coronary artery    DES LAD 02/2008, DES LAD 02/2015  . Depression   . Dyslipidemia   . Essential hypertension   . Facial numbness   . Frequent headaches   . GERD (gastroesophageal reflux disease)   . Glaucoma   . Insomnia   . Paroxysmal atrial fibrillation [facility])    Diagnosed October 2019  . PSVT (paroxysmal supraventricular tachycardia) ([place])   . Refusal of blood transfusions as patient is Jehovah's Witness   . Type 2 diabetes mellitus (HCC)    Borderline  . Vitamin D deficiency     Past Surgical History:  Procedure Laterality Date  . CARDIAC CATHETERIZATION N/A 02/23/2015   Procedure: Left Heart Cath and  Coronary Angiography;  Surgeon: Sherren Mocha, MD;  Location: [place] [facility];  Service: Cardiovascular;  Laterality: N/A;  . CARDIAC CATHETERIZATION N/A 02/23/2015   Procedure: Coronary Stent Intervention;  Surgeon: Sherren Mocha, MD;  Location: [place] [facility];  Service: Cardiovascular;  Laterality: N/A;  . CARDIAC CATHETERIZATION N/A 01/04/2016   Procedure: Left Heart Cath and Coronary Angiography;  Surgeon: Peter M Martinique, MD;  Location: [place] [facility];  Service: Cardiovascular;  Laterality: N/A;  . CATARACT EXTRACTION    . CORONARY ANGIOPLASTY  02/23/2015  . CORONARY ANGIOPLASTY WITH STENT PLACEMENT  2009  . DILATION AND CURETTAGE OF UTERUS    . TUBAL LIGATION      Family History  Problem Relation Age of Onset  . Other Mother        "natural causes"  . Heart disease Mother   . Cancer Father        unsure of type    Social History   Socioeconomic History  . Marital status: Widowed    Spouse name: Not on file  . Number of children: 8  . Years of education: 10th grade  . Highest education level: Not on file  Occupational History  . Occupation: RETIRED  Tobacco Use  . Smoking status: Never Smoker  . Smokeless tobacco: Never Used  Vaping Use  . Vaping Use: Never used  Substance and Sexual Activity  . Alcohol use: No    Alcohol/week: 0.0 standard drinks    Comment: RARE OCCASION  . Drug use:  No  . Sexual activity: Never  Other Topics Concern  . Not on file  Social History Narrative   Lives at home alone.   Right-handed.   No caffeine use.   Social Determinants of Health                                                                         Outpatient Medications Prior to Visit  Medication Sig Dispense Refill  . acetaminophen (TYLENOL) 650 MG CR tablet Take 1,300 mg by mouth every 8 (eight) hours as needed for pain.    Marland Kitchen amitriptyline (ELAVIL) 10 MG tablet Take 1 tablet (10 mg total) by mouth at bedtime. 60 tablet 0  . apixaban  (ELIQUIS) 5 MG TABS tablet Take 1 tablet (5 mg total) by mouth 2 (two) times daily. 60 tablet 11  . Ascorbic Acid (VITAMIN C) 500 MG tablet Take 500 mg by mouth daily.      . bimatoprost (LUMIGAN) 0.01 % SOLN Place 1 drop into both eyes at bedtime.    . blood glucose meter kit and supplies Dispense based on patient and insurance preference. Use up to four times daily as directed. (FOR ICD-10 E10.9, E11.9). 1 each 0  . Calcium Carbonate Antacid (TUMS PO) Take 2 tablets by mouth 4 (four) times daily as needed (for acid reflux/indigestion).     . Cholecalciferol (VITAMIN D) 400 UNITS capsule Take 400 Units by mouth daily.      . Cyanocobalamin (VITAMIN B 12 PO) Take 1 tablet by mouth daily.    Marland Kitchen diltiazem (CARDIZEM CD) 180 MG 24 hr capsule TAKE ONE (1) CAPSULE EACH DAY 90 capsule 3  . Garlic 144 MG TABS Take 1 tablet by mouth daily.      Marland Kitchen HYDROcodone-acetaminophen (NORCO) 10-325 MG tablet Take 1 tablet by mouth 4 (four) times daily as needed. For pain.    . hydrocortisone 2.5 % cream Apply 2 (two) times daily topically. 30 g 0  . ibuprofen (ADVIL,MOTRIN) 200 MG tablet Take 600 mg by mouth every 6 (six) hours as needed (for pain.).     Marland Kitchen MAGNESIUM CARBONATE PO Take 1 tablet by mouth daily. 400 mg    . Menthol, Topical Analgesic, (BIOFREEZE) 4 % GEL Apply 1 application topically 4 (four) times daily as needed (for pain).     . metFORMIN (GLUCOPHAGE-XR) 500 MG 24 hr tablet Take 1 tablet by mouth daily.     . metoprolol tartrate (LOPRESSOR) 100 MG tablet Take 1 tablet (100 mg total) by mouth 2 (two) times daily. 180 tablet 3  . nitroGLYCERIN (NITROSTAT) 0.4 MG SL tablet Place 0.4 mg under the tongue every 5 (five) minutes as needed for chest pain.    . pantoprazole (PROTONIX) 40 MG tablet TAKE ONE (1) TABLET EACH DAY 90 tablet 2  . promethazine (PHENERGAN) 25 MG tablet 1 tablet as needed.    . timolol (TIMOPTIC) 0.5 % ophthalmic solution Place 1 drop into both eyes daily.    . TURMERIC PO Take 1 tablet  by mouth daily.    . VENTOLIN HFA 108 (90 Base) MCG/ACT inhaler Inhale 1-2 puffs into the lungs every 4 (four) hours as needed for shortness of breath.    . losartan (  COZAAR) 25 MG tablet Take 1 tablet (25 mg total) by mouth daily. (Patient not taking: Reported on 11/20/2019) 90 tablet 1  . rosuvastatin (CRESTOR) 5 MG tablet Take 1 tablet (5 mg total) by mouth daily. (Patient not taking: Reported on 11/20/2019) 90 tablet 1  . traZODone (DESYREL) 100 MG tablet Take 1 tablet by mouth at bedtime. (Patient not taking: Reported on 11/20/2019)    . magic mouthwash w/lidocaine SOLN Take 5 mLs by mouth 3 (three) times daily. 160 mL 0   No facility-administered medications prior to visit.    No Known Allergies  ROS Review of Systems  Musculoskeletal: Positive for back pain.  Psychiatric/Behavioral: Negative for self-injury and suicidal ideas. The patient is not nervous/anxious.   All other systems reviewed and are negative.     Objective:    Physical Exam Vitals reviewed. Exam conducted with a chaperone present.  Constitutional:      Appearance: Normal appearance.  HENT:     Head: Normocephalic.  Eyes:     Conjunctiva/sclera: Conjunctivae normal.  Cardiovascular:     Rate and Rhythm: Normal rate and regular rhythm.     Pulses: Normal pulses.     Heart sounds: Normal heart sounds.  Pulmonary:     Effort: Pulmonary effort is normal.     Breath sounds: Normal breath sounds.  Abdominal:     General: Bowel sounds are normal.  Musculoskeletal:        General: Tenderness present.  Skin:    General: Skin is warm.  Neurological:     Mental Status: She is alert and oriented to person, place, and time.  Psychiatric:        Mood and Affect: Mood normal.        Behavior: Behavior normal.     BP 137/69   Pulse 61   Temp (!) 97.2 F (36.2 C) (Temporal)   Resp 20   Ht 5' 4"  (1.626 m)   Wt 174 lb (78.9 kg)   LMP  (LMP Unknown)   SpO2 96%   BMI 29.87 kg/m  Wt Readings from Last 3  Encounters:  11/20/19 174 lb (78.9 kg)  10/02/19 165 lb 9.6 oz (75.1 kg)  09/12/19 165 lb 6.4 oz (75 kg)     Health Maintenance Due  Topic Date Due  . Hepatitis C Screening  Never done  . FOOT EXAM  Never done  . OPHTHALMOLOGY EXAM  Never done  . DEXA SCAN  Never done  . PNA vac Low Risk Adult (1 of 2 - PCV13) Never done  . INFLUENZA VACCINE  09/21/2019    There are no preventive care reminders to display for this patient.  Lab Results  Component Value Date   TSH 11.769 (H) 11/11/2019   Lab Results  Component Value Date   WBC 5.4 08/19/2019   HGB 13.3 08/19/2019   HCT 40 08/19/2019   MCV 90.8 08/12/2019   PLT 209 08/19/2019   Lab Results  Component Value Date   NA 138 10/21/2019   K 4.0 10/21/2019   CO2 26 10/21/2019   GLUCOSE 124 (H) 10/21/2019   BUN 15 10/21/2019   CREATININE 0.80 10/21/2019   BILITOT 0.4 08/12/2019   ALKPHOS 80 08/19/2019   AST 16 08/19/2019   ALT 13 08/19/2019   PROT 7.3 08/12/2019   ALBUMIN 4.3 08/19/2019   CALCIUM 9.3 10/21/2019   ANIONGAP 10 10/21/2019   Lab Results  Component Value Date   CHOL 251 (H) 11/11/2019  Lab Results  Component Value Date   HDL 48 11/11/2019   Lab Results  Component Value Date   LDLCALC 180 (H) 11/11/2019   Lab Results  Component Value Date   TRIG 113 11/11/2019   Lab Results  Component Value Date   CHOLHDL 5.2 11/11/2019   Lab Results  Component Value Date   HGBA1C 5.7 08/19/2019      Assessment & Plan:   Problem List Items Addressed This Visit      Other   Numbness    Patient is reporting symptoms of numbness and tingling bilateral lower extremities.  Onset of symptoms was gradual a few years ago.  Patient reports back  Pain with pain radiating down legs. Patient has consulted with orthopedic in the past with injections.  Patient will    Provided education with printed handouts given.   Orthopedic referral completed  Elavil refill sent to pharmacy      Numbness of tongue     Symptoms well controlled on Elavil.  No changes to medication at this time.   Refill sent to pharmacy      Relevant Medications   amitriptyline (ELAVIL) 10 MG tablet   Anxiety with depression    Patient is following up for anxiety and depression.  Reporting improved symptoms with Elavil 10 mg daily.  Patient also is reporting improved appetite and has gained a total of 9 pounds in the last 6 weeks.  Onset was approximately few weeks ago, she denies current suicidal and homicidal ideations.  Continue to provide education with printed handouts given.  Rx refill sent to pharmacy.      Relevant Medications   amitriptyline (ELAVIL) 10 MG tablet    Other Visit Diagnoses    Need for immunization against influenza    -  Primary   Relevant Orders   Flu Vaccine QUAD High Dose(Fluad) (Completed)      Meds ordered this encounter  Medications  . amitriptyline (ELAVIL) 10 MG tablet    Sig: Take 1 tablet (10 mg total) by mouth at bedtime.    Dispense:  60 tablet    Refill:  2    Order Specific Question:   Supervising Provider    Answer:   Caryl Pina A [id number]    Follow-up: Return in about 3 months (around 02/19/2020).    Ivy Lynn, NP

## 2019-11-20 NOTE — Telephone Encounter (Signed)
Please call [phone]

## 2019-11-20 NOTE — Assessment & Plan Note (Signed)
Patient is following up for anxiety and depression.  Reporting improved symptoms with Elavil 10 mg daily.  Patient also is reporting improved appetite and has gained a total of 9 pounds in the last 6 weeks.  Onset was approximately few weeks ago, she denies current suicidal and homicidal ideations.  Continue to provide education with printed handouts given.  Rx refill sent to pharmacy.

## 2019-11-20 NOTE — Telephone Encounter (Signed)
Contacted office to clarify medication that was called in to her pharmacy last week. Advised that crestor 5 mg was sent to her pharmacy. Verbalized understanding.

## 2019-11-20 NOTE — Assessment & Plan Note (Signed)
Patient is reporting symptoms of numbness and tingling bilateral lower extremities.  Onset of symptoms was gradual a few years ago.  Patient reports back  Pain with pain radiating down legs. Patient has consulted with orthopedic in the past with injections.  Patient will    Provided education with printed handouts given.   Orthopedic referral completed  Elavil refill sent to pharmacy

## 2019-11-20 NOTE — Assessment & Plan Note (Signed)
Symptoms well controlled on Elavil.  No changes to medication at this time.   Refill sent to pharmacy

## 2019-11-20 NOTE — Patient Instructions (Signed)
Neuropathic Pain Neuropathic pain is pain caused by damage to the nerves that are responsible for certain sensations in your body (sensory nerves). The pain can be caused by:  Damage to the sensory nerves that send signals to your spinal cord and brain (peripheral nervous system).  Damage to the sensory nerves in your brain or spinal cord (central nervous system). Neuropathic pain can make you more sensitive to pain. Even a minor sensation can feel very painful. This is usually a long-term condition that can be difficult to treat. The type of pain differs from person to person. It may:  Start suddenly (acute), or it may develop slowly and last for a long time (chronic).  Come and go as damaged nerves heal, or it may stay at the same level for years.  Cause emotional distress, loss of sleep, and a lower quality of life. What are the causes? The most common cause of this condition is diabetes. Many other diseases and conditions can also cause neuropathic pain. Causes of neuropathic pain can be classified as:  Toxic. This is caused by medicines and chemicals. The most common cause of toxic neuropathic pain is damage from cancer treatments (chemotherapy).  Metabolic. This can be caused by: ? Diabetes. This is the most common disease that damages the nerves. ? Lack of vitamin B from long-term alcohol abuse.  Traumatic. Any injury that cuts, crushes, or stretches a nerve can cause damage and pain. A common example is feeling pain after losing an arm or leg (phantom limb pain).  Compression-related. If a sensory nerve gets trapped or compressed for a long period of time, the blood supply to the nerve can be cut off.  Vascular. Many blood vessel diseases can cause neuropathic pain by decreasing blood supply and oxygen to nerves.  Autoimmune. This type of pain results from diseases in which the body's defense system (immune system) mistakenly attacks sensory nerves. Examples of autoimmune diseases  that can cause neuropathic pain include lupus and multiple sclerosis.  Infectious. Many types of viral infections can damage sensory nerves and cause pain. Shingles infection is a common cause of this type of pain.  Inherited. Neuropathic pain can be a symptom of many diseases that are passed down through families (genetic). What increases the risk? You are more likely to develop this condition if:  You have diabetes.  You smoke.  You drink too much alcohol.  You are taking certain medicines, including medicines that kill cancer cells (chemotherapy) or that treat immune system disorders. What are the signs or symptoms? The main symptom is pain. Neuropathic pain is often described as:  Burning.  Shock-like.  Stinging.  Hot or cold.  Itching. How is this diagnosed? No single test can diagnose neuropathic pain. It is diagnosed based on:  Physical exam and your symptoms. Your health care provider will ask you about your pain. You may be asked to use a pain scale to describe how bad your pain is.  Tests. These may be done to see if you have a high sensitivity to pain and to help find the cause and location of any sensory nerve damage. They include: ? Nerve conduction studies to test how well nerve signals travel through your sensory nerves (electrodiagnostic testing). ? Stimulating your sensory nerves through electrodes on your skin and measuring the response in your spinal cord and brain (somatosensory evoked potential).  Imaging studies, such as: ? X-rays. ? CT scan. ? MRI. How is this treated? Treatment for neuropathic pain may change   over time. You may need to try different treatment options or a combination of treatments. Some options include:  Treating the underlying cause of the neuropathy, such as diabetes, kidney disease, or vitamin deficiencies.  Stopping medicines that can cause neuropathy, such as chemotherapy.  Medicine to relieve pain. Medicines may  include: ? Prescription or over-the-counter pain medicine. ? Anti-seizure medicine. ? Antidepressant medicines. ? Pain-relieving patches that are applied to painful areas of skin. ? A medicine to numb the area (local anesthetic), which can be injected as a nerve block.  Transcutaneous nerve stimulation. This uses electrical currents to block painful nerve signals. The treatment is painless.  Alternative treatments, such as: ? Acupuncture. ? Meditation. ? Massage. ? Physical therapy. ? Pain management programs. ? Counseling. Follow these instructions at home: Medicines   Take over-the-counter and prescription medicines only as told by your health care provider.  Do not drive or use heavy machinery while taking prescription pain medicine.  If you are taking prescription pain medicine, take actions to prevent or treat constipation. Your health care provider may recommend that you: ? Drink enough fluid to keep your urine pale yellow. ? Eat foods that are high in fiber, such as fresh fruits and vegetables, whole grains, and beans. ? Limit foods that are high in fat and processed sugars, such as fried or sweet foods. ? Take an over-the-counter or prescription medicine for constipation. Lifestyle   Have a good support system at home.  Consider joining a chronic pain support group.  Do not use any products that contain nicotine or tobacco, such as cigarettes and e-cigarettes. If you need help quitting, ask your health care provider.  Do not drink alcohol. General instructions  Learn as much as you can about your condition.  Work closely with all your health care providers to find the treatment plan that works best for you.  Ask your health care provider what activities are safe for you.  Keep all follow-up visits as told by your health care provider. This is important. Contact a health care provider if:  Your pain treatments are not working.  You are having side effects  from your medicines.  You are struggling with tiredness (fatigue), mood changes, depression, or anxiety. Summary  Neuropathic pain is pain caused by damage to the nerves that are responsible for certain sensations in your body (sensory nerves).  Neuropathic pain may come and go as damaged nerves heal, or it may stay at the same level for years.  Neuropathic pain is usually a long-term condition that can be difficult to treat. Consider joining a chronic pain support group. This information is not intended to replace advice given to you by your health care provider. Make sure you discuss any questions you have with your health care provider. Document Revised: 05/30/2018 Document Reviewed: 02/23/2017 Elsevier Patient Education  [place]. Depression Screening Depression screening is a tool that your health care provider can use to learn if you have symptoms of depression. Depression is a common condition with many symptoms that are also often found in other conditions. Depression is treatable, but it must first be diagnosed. You may not know that certain feelings, thoughts, and behaviors that you are having can be symptoms of depression. Taking a depression screening test can help you and your health care provider decide if you need more assessment, or if you should be referred to a mental health care provider. What are the screening tests?  You may have a physical exam to  see if another condition is affecting your mental health. You may have a blood or urine sample taken during the physical exam.  You may be interviewed using a screening tool that was developed from research, such as one of these: ? Patient Health Questionnaire (PHQ). This is a set of either 2 or 9 questions. A health care provider who has been trained to score this screening test uses a guide to assess if your symptoms suggest that you may have depression. ? Hamilton Depression Rating Scale (HAM-D). This is a set of  either 17 or 24 questions. You may be asked to take it again during or after your treatment, to see if your depression has gotten better. ? Beck Depression Inventory (BDI). This is a set of 21 multiple choice questions. Your health care provider scores your answers to assess:  Your level of depression, ranging from mild to severe.  Your response to treatment.  Your health care provider may talk with you about your daily activities, such as eating, sleeping, work, and recreation, and ask if you have had any changes in activity.  Your health care provider may ask you to see a mental health specialist, such as a psychiatrist or psychologist, for more evaluation. Who should be screened for depression?   All adults, including adults with a family history of a mental health disorder.  Adolescents who are 22-88 years old.  People who are recovering from a myocardial infarction (MI).  Pregnant women, or women who have given birth.  People who have a long-term (chronic) illness.  Anyone who has been diagnosed with another type of a mental health disorder.  Anyone who has symptoms that could show depression. What do my results mean? Your health care provider will review the results of your depression screening, physical exam, and lab tests. Positive screens suggest that you may have depression. Screening is the first step in getting the care that you may need. It is up to you to get your screening results. Ask your health care provider, or the department that is doing your screening tests, when your results will be ready. Talk with your health care provider about your results and diagnosis. A diagnosis of depression is made using the Diagnostic and Statistical Manual of Mental Disorders (DSM-V). This is a book that lists the number and type of symptoms that must be present for a health care provider to give a specific diagnosis.  Your health care provider may work with you to treat your symptoms  of depression, or your health care provider may help you find a mental health provider who can assess, diagnose, and treat your depression. Get help right away if:  You have thoughts about hurting yourself or others. If you ever feel like you may hurt yourself or others, or have thoughts about taking your own life, get help right away. You can go to your nearest emergency department or call:  Your local emergency services (911 in the [place]  A suicide crisis helpline, such as the [facility] at (418) 006-0243. This is open 24 hours a day. Summary  Depression screening is the first step in getting the help that you may need.  If your screening test shows symptoms of depression (is positive), your health care provider may ask you to see a mental health provider.  Anyone who is age 49 or older should be screened for depression. This information is not intended to replace advice given to you by your health care provider. Make  sure you discuss any questions you have with your health care provider. Document Revised: 01/19/2017 Document Reviewed: 06/23/2016 Elsevier Patient Education  [name].

## 2019-11-27 ENCOUNTER — Ambulatory Visit: Payer: Medicare Other | Admitting: Family Medicine

## 2019-12-01 ENCOUNTER — Telehealth: Payer: Self-pay | Admitting: Cardiology

## 2019-12-01 NOTE — Telephone Encounter (Signed)
Pt called stating she's having terrible shortness of breath, but it only happens at night- head gets hot and she gets a terrible headache and one night she was nauseous . Her heart starts racing. Stated she's taken nitro 2 nights in a row   States no chest pain, no dizziness   Pt thinks it's coming from her med changes from last visit.   [phone]

## 2019-12-02 NOTE — Telephone Encounter (Signed)
Returned pt call. No answer, left msg to call back.

## 2019-12-02 NOTE — Telephone Encounter (Signed)
Please continue to reach out to patient to see if we can get a better sense of her symptoms and whether she needs to be evaluated more acutely.

## 2019-12-02 NOTE — Telephone Encounter (Signed)
Returning call.

## 2019-12-03 NOTE — Telephone Encounter (Signed)
Returned call to pt. No answer, no voicemail.  °

## 2019-12-04 NOTE — Telephone Encounter (Signed)
Spoke with pt who states that she is SOB when walking to the mailbox. Pt c/o heart racing at night, and chest pain at times. She states that she has trouble sleeping at night. Pt reports that her BP with home monitor is 194/73 HR 53. Pt denies being dizzy but does c/o being tired.

## 2019-12-04 NOTE — Telephone Encounter (Signed)
Appt made for  [name] on Monday, 10/18 at 345 pm in the [place] office

## 2019-12-04 NOTE — Telephone Encounter (Signed)
Thank you for checking in with her.  Let's see if we can get her scheduled for an office visit soon with APP to review things further.

## 2019-12-08 ENCOUNTER — Ambulatory Visit: Payer: Medicare Other

## 2019-12-08 ENCOUNTER — Other Ambulatory Visit: Payer: Self-pay

## 2019-12-08 ENCOUNTER — Ambulatory Visit (INDEPENDENT_AMBULATORY_CARE_PROVIDER_SITE_OTHER): Payer: Medicare Other | Admitting: Cardiology

## 2019-12-08 ENCOUNTER — Encounter: Payer: Self-pay | Admitting: Cardiology

## 2019-12-08 VITALS — BP 142/68 | HR 54 | Ht 65.0 in | Wt 174.8 lb

## 2019-12-08 DIAGNOSIS — Z79899 Other long term (current) drug therapy: Secondary | ICD-10-CM

## 2019-12-08 DIAGNOSIS — R001 Bradycardia, unspecified: Secondary | ICD-10-CM

## 2019-12-08 DIAGNOSIS — I480 Paroxysmal atrial fibrillation: Secondary | ICD-10-CM

## 2019-12-08 DIAGNOSIS — I4891 Unspecified atrial fibrillation: Secondary | ICD-10-CM

## 2019-12-08 DIAGNOSIS — E032 Hypothyroidism due to medicaments and other exogenous substances: Secondary | ICD-10-CM

## 2019-12-08 DIAGNOSIS — Z7901 Long term (current) use of anticoagulants: Secondary | ICD-10-CM

## 2019-12-08 DIAGNOSIS — I10 Essential (primary) hypertension: Secondary | ICD-10-CM

## 2019-12-08 DIAGNOSIS — I2510 Atherosclerotic heart disease of native coronary artery without angina pectoris: Secondary | ICD-10-CM

## 2019-12-08 DIAGNOSIS — I471 Supraventricular tachycardia: Secondary | ICD-10-CM | POA: Diagnosis not present

## 2019-12-08 NOTE — Progress Notes (Signed)
Cardiology Office Note   Date:  12/08/2019   ID:  [name], DOB [birth year], MRN [id number]  PCP:  Loman Brooklyn, FNP  Cardiologist:  Dr. Domenic Polite    Chief Complaint  Patient presents with  . Tachycardia      History of Present Illness: [name] is a 79 y.o. 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  eliquis   Event monitor with  very freq short episodes of SVT  09/2018 Low risk study no ischemia on Nuc 08/30/18 Echo 2019 with EF 65-70%, no RWMA   Amiodarone stopped 11/11/19 due to elevated TSH    Now pt with 3 -4 days of episodic rapid heart rate. Racing more at night.  Today in SB at 91.  She was and continues on lopressor 100 mg Bid and dilt CD 180 mg daily.  Last labs were stable.   Racing HR associated with chest pain that resolves once HR slows.  We discussed other treatment options, for PAF, ablation tikosyn. Also has SSS and with bradycardia concern in future for PPM.  No symptoms with current rate.   She has been seen by atrial fib clinic in past.     Past Medical History:  Diagnosis Date  . Anxiety   . Arthritis   . Bulging lumbar disc   . Chronic lower back pain   . Coronary atherosclerosis of native coronary artery    DES LAD 02/2008, DES LAD 02/2015  . Depression   . Dyslipidemia   . Essential hypertension   . Facial numbness   . Frequent headaches   . GERD (gastroesophageal reflux disease)   . Glaucoma   . Insomnia   . Paroxysmal atrial fibrillation [facility])    Diagnosed October 2019  . PSVT (paroxysmal supraventricular tachycardia) ([place])   . Refusal of blood transfusions as patient is Jehovah's Witness   . Type 2 diabetes mellitus (HCC)    Borderline  . Vitamin D deficiency     Past Surgical History:  Procedure Laterality Date  . CARDIAC CATHETERIZATION N/A 02/23/2015   Procedure: Left Heart  Cath and Coronary Angiography;  Surgeon: Sherren Mocha, MD;  Location: [facility];  Service: Cardiovascular;  Laterality: N/A;  . CARDIAC CATHETERIZATION N/A 02/23/2015   Procedure: Coronary Stent Intervention;  Surgeon: Sherren Mocha, MD;  Location: [facility];  Service: Cardiovascular;  Laterality: N/A;  . CARDIAC CATHETERIZATION N/A 01/04/2016   Procedure: Left Heart Cath and Coronary Angiography;  Surgeon: Peter M Martinique, MD;  Location: [facility];  Service: Cardiovascular;  Laterality: N/A;  . CATARACT EXTRACTION    . CORONARY ANGIOPLASTY  02/23/2015  . CORONARY ANGIOPLASTY WITH STENT PLACEMENT  2009  . DILATION AND CURETTAGE OF UTERUS    . TUBAL LIGATION       Current Outpatient Medications  Medication Sig Dispense Refill  . acetaminophen (TYLENOL) 650 MG CR tablet Take 1,300 mg by mouth every 8 (eight) hours as needed for pain.    Marland Kitchen amitriptyline (ELAVIL) 10 MG tablet Take 1 tablet (10 mg total) by mouth at bedtime. 60 tablet 2  . apixaban (ELIQUIS) 5 MG TABS tablet Take 1 tablet (5 mg total) by mouth 2 (two) times daily. 60 tablet 11  . Ascorbic Acid (VITAMIN C) 500 MG tablet Take 500  mg by mouth daily.      . bimatoprost (LUMIGAN) 0.01 % SOLN Place 1 drop into both eyes at bedtime.    . blood glucose meter kit and supplies Dispense based on patient and insurance preference. Use up to four times daily as directed. (FOR ICD-10 E10.9, E11.9). 1 each 0  . Calcium Carbonate Antacid (TUMS PO) Take 2 tablets by mouth 4 (four) times daily as needed (for acid reflux/indigestion).     . Cholecalciferol (VITAMIN D) 400 UNITS capsule Take 400 Units by mouth daily.      . Cyanocobalamin (VITAMIN B 12 PO) Take 1 tablet by mouth daily.    Marland Kitchen diltiazem (CARDIZEM CD) 180 MG 24 hr capsule TAKE ONE (1) CAPSULE EACH DAY 90 capsule 3  . Garlic 701 MG TABS Take 1 tablet by mouth daily.      Marland Kitchen HYDROcodone-acetaminophen (NORCO) 10-325 MG tablet Take 1 tablet by mouth 4 (four) times  daily as needed. For pain.    . hydrocortisone 2.5 % cream Apply 2 (two) times daily topically. 30 g 0  . ibuprofen (ADVIL,MOTRIN) 200 MG tablet Take 600 mg by mouth every 6 (six) hours as needed (for pain.).     Marland Kitchen losartan (COZAAR) 25 MG tablet Take 1 tablet (25 mg total) by mouth daily. 90 tablet 1  . MAGNESIUM CARBONATE PO Take 1 tablet by mouth daily. 400 mg    . Menthol, Topical Analgesic, (BIOFREEZE) 4 % GEL Apply 1 application topically 4 (four) times daily as needed (for pain).     . metFORMIN (GLUCOPHAGE-XR) 500 MG 24 hr tablet Take 1 tablet by mouth daily.     . metoprolol tartrate (LOPRESSOR) 100 MG tablet Take 1 tablet (100 mg total) by mouth 2 (two) times daily. 180 tablet 3  . nitroGLYCERIN (NITROSTAT) 0.4 MG SL tablet Place 0.4 mg under the tongue every 5 (five) minutes as needed for chest pain.    . pantoprazole (PROTONIX) 40 MG tablet TAKE ONE (1) TABLET EACH DAY 90 tablet 2  . promethazine (PHENERGAN) 25 MG tablet 1 tablet as needed.    . rosuvastatin (CRESTOR) 5 MG tablet Take 1 tablet (5 mg total) by mouth daily. 90 tablet 1  . timolol (TIMOPTIC) 0.5 % ophthalmic solution Place 1 drop into both eyes daily.    . traZODone (DESYREL) 100 MG tablet Take 1 tablet by mouth at bedtime.     . TURMERIC PO Take 1 tablet by mouth daily.    . VENTOLIN HFA 108 (90 Base) MCG/ACT inhaler Inhale 1-2 puffs into the lungs every 4 (four) hours as needed for shortness of breath.     No current facility-administered medications for this visit.    Allergies:   Patient has no known allergies.    Social History:  The patient  reports that she has never smoked. She has never used smokeless tobacco. She reports that she does not drink alcohol and does not use drugs.   Family History:  The patient's family history includes Cancer in her father; Heart disease in her mother; Other in her mother.    ROS:  General:no colds or fevers, no weight changes Skin:no rashes or ulcers HEENT:no blurred  vision, no congestion CV:see HPI PUL:see HPI GI:no diarrhea constipation or melena, no indigestion GU:no hematuria, no dysuria MS:no joint pain, no claudication Neuro:no syncope, no lightheadedness Endo:+ diabetes, + thyroid disease  Wt Readings from Last 3 Encounters:  12/08/19 174 lb 12.8 oz (79.3 kg)  11/20/19 174 lb (  78.9 kg)  10/02/19 165 lb 9.6 oz (75.1 kg)     PHYSICAL EXAM: VS:  BP (!) 142/68   Pulse (!) 54   Ht 5' 5" (1.651 m)   Wt 174 lb 12.8 oz (79.3 kg)   LMP  (LMP Unknown)   SpO2 98%   BMI 29.09 kg/m  , BMI Body mass index is 29.09 kg/m. General:Pleasant affect, NAD Skin:Warm and dry, brisk capillary refill HEENT:normocephalic, sclera clear, mucus membranes moist Neck:supple, no JVD, no bruits  Heart:S1S2 RRR without murmur, gallup, rub or click Lungs:clear without rales, rhonchi, or wheezes ACZ:YSAY, non tender, + BS, do not palpate liver spleen or masses Ext:no lower ext edema, 2+ pedal pulses, 2+ radial pulses Neuro:alert and oriented, MAE, follows commands, + facial symmetry    EKG:  EKG is ordered today. The ekg ordered today demonstrates Sinus brady at 54 and Rt ward axis no acute ST changes, only non specific.   Recent Labs: 08/19/2019: ALT 13; Hemoglobin 13.3; Platelets 209 10/21/2019: BUN 15; Creatinine, Ser 0.80; Potassium 4.0; Sodium 138 11/11/2019: TSH 11.769    Lipid Panel    Component Value Date/Time   CHOL 251 (H) 11/11/2019 1124   TRIG 113 11/11/2019 1124   HDL 48 11/11/2019 1124   CHOLHDL 5.2 11/11/2019 1124   VLDL 23 11/11/2019 1124   LDLCALC 180 (H) 11/11/2019 1124       Other studies Reviewed: Additional studies/ records that were reviewed today include: .  Cardiac monitor 10/07/2018: Sinus rhythm Very frequent, short episodes of SVT are noted. These appear at times consistent with ectopic atrial tachycardia 307 Supraventricular Tachycardia runs occurred, the longest lasting 44.2 secs with an avg rate of 127 bpm  No  atrial fibrillation is observed No AV block or pauses No sustained ventricular arrhythmias  Lexiscan Myoview 08/29/2018:  The left ventricular ejection fraction is normal (55-65%).  Nuclear stress EF: 62%.  There was no ST segment deviation noted during stress.  The study is normal.  This is a low risk study.  Normal stress nuclear study with no ischemia or infarction. Gated ejection fraction 62% with normal wall motion.  Echocardiogram 12/18/2017: Study Conclusions  - Left ventricle: The cavity size was normal. Wall thickness was increased in a pattern of moderate LVH. Systolic function was vigorous. The estimated ejection fraction was in the range of 65% to 70%. Wall motion was normal; there were no regional wall motion abnormalities. The study is not technically sufficient to allow evaluation of LV diastolic function. - Aortic valve: Mildly calcified annulus. Trileaflet; mildly thickened leaflets. Valve area (VTI): 3.26 cm^2. Valve area (Vmax): 3.2 cm^2. - Mitral valve: Mildly calcified annulus. Mildly thickened leaflets. - Left atrium: The atrium was mildly dilated. - Atrial septum: No defect or patent foramen ovale was identified. - Pulmonary arteries: Systolic pressure was mildly increased. PA peak pressure: 31 mm Hg (S).   ASSESSMENT AND PLAN:  1.  PAF now off amiodarone due to TSH 11.  freq episodes over last 3-4 days. Associated chest pain that resolved with cessation of rapid HR.  On BB and dilt today HR 54, asymptomatic.   Continue current meds, will check echo and follow up with a fib clinic, will also do live zio patch for 14 days to eval the rhythm and eval a fib burden and bradycardia.   Follow up with Dr. Domenic Polite in 1-2 months  2.  Sinus brady asymptomatic. check with zio patch  3.  anticoagulation on eliquis with CHA2DS2VASc of 6,  no missed doses   4.  CAD wit hx of DES to LAD in 2010 and 2017.  No angina except with rapid HR.  Last  myoview neg for ischemia 08/2018  5.  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  No side effects at this time.    6.  HTN improved from last visit monitor   7.  Hypothyroid since on amiodarone.  Monitor    Current medicines are reviewed with the patient today.  The patient Has no concerns regarding medicines.  The following changes have been made:  See above Labs/ tests ordered today include:see above  Disposition:   FU:  see above  Signed, Cecilie Kicks, NP  12/08/2019 4:24 PM    [address], [place], [place], [place] Phone: [phone]; Fax: [phone]

## 2019-12-08 NOTE — Patient Instructions (Signed)
Medication Instructions:  Your physician recommends that you continue on your current medications as directed. Please refer to the Current Medication list given to you today.  *If you need a refill on your cardiac medications before your next appointment, please call your pharmacy*   Lab Work: None ordered  If you have labs (blood work) drawn today and your tests are completely normal, you will receive your results only by: Marland Kitchen MyChart Message (if you have MyChart) OR . A paper copy in the mail If you have any lab test that is abnormal or we need to change your treatment, we will call you to review the results.   Testing/Procedures: Your physician has requested that you have an echocardiogram. Echocardiography is a painless test that uses sound waves to create images of your heart. It provides your doctor with information about the size and shape of your heart and how well your heart's chambers and valves are working. This procedure takes approximately one hour. There are no restrictions for this procedure.  ZIO AT Long term monitor-Live Telemetry  Your physician has requested you wear a ZIO patch monitor for 14 days.  This is a single patch monitor. [employer] supplies one patch monitor per enrollment. Additional stickers are not available.  Please do not apply patch if you will be having a Nuclear Stress Test, Echocardiogram, Cardiac CT, MRI, or Chest Xray during the time frame you would be wearing the monitor. The patch cannot be worn during these tests. You cannot remove and re-apply the ZIO AT patch monitor.   Your ZIO patch monitor will be sent Fed Ex from [employer] directly to your home address. The monitor may also be mailed to a PO BOX if home delivery is not available. It may take 3-5 days to receive your monitor after you have been enrolled.  Once you have received you monitor, please review enclosed instructions. Your monitor has already been registered assigning a specific  monitor serial # to you.   Applying the monitor  Shave hair from upper left chest.  Hold abrader disc by orange tab. Rub abrader in 40 strokes over left upper chest as indicated in your monitor instructions.  Clean area with 4 enclosed alcohol pads. Use all pads to ensure the area is cleaned thoroughly. Let dry.  Apply patch as indicated in monitor instructions. Patch will be placed under collarbone on left side of chest with arrow pointing upward.  Rub patch adhesive wings for 2 minutes. Remove the white label marked "1". Remove the white label marked "2". Rub patch adhesive wings for 2 additional minutes.  While looking in a mirror, press and release button in center of patch. A small green light will flash 3-4 times. This will be your only indicator the monitor has been turned on.  Do not shower for the first 24 hours. You may shower after the first 24 hours.  Press the button if you feel a symptom. You will hear a small click. Record Date, Time and Symptom in the Patient Log.   Starting the Gateway  In your kit there is a Hydrographic surveyor box the size of a cellphone. This is Airline pilot. It transmits all your recorded data to [facility]. This box must stay within 10 feet of you at all times. Open the box and push the * button. There will be a light that blinks orange and then green a few times. When the light stops blinking, the Gateway is connected to the ZIO patch.  Call [employer] at [phone] to confirm your monitor is transmitting.   Returning your monitor  Remove your patch and place it inside the [place]. In the lower half of the Gateway there is a white bag with prepaid postage on it. Place Gateway in bag and seal. Mail package back to [facility] as soon as possible. Your physician should have your final report approximately 7 days after you have mailed back your monitor.   Call [place] at [phone] if you have questions regarding your ZIO AT patch monitor. Call  them immediately if you see an orange light blinking on your monitor.  If your monitor falls off in less than 4 days contact our Monitor department at [phone]. If your monitor becomes loose or falls off after 4 days call [employer] at [phone] for suggestions on securing your monitor.      Follow-Up: At [facility], you and your health needs are our priority.  As part of our continuing mission to provide you with exceptional heart care, we have created designated Provider Care Teams.  These Care Teams include your primary Cardiologist (physician) and Advanced Practice Providers (APPs -  Physician Assistants and Nurse Practitioners) who all work together to provide you with the care you need, when you need it.  We recommend signing up for the patient portal called "MyChart".  Sign up information is provided on this After Visit Summary.  MyChart is used to connect with patients for Virtual Visits (Telemedicine).  Patients are able to view lab/test results, encounter notes, upcoming appointments, etc.  Non-urgent messages can be sent to your provider as well.   To learn more about what you can do with MyChart, go to [email or web address].    Your next appointment:   AFTER THE ECHO   The format for your next appointment:   In Person  Provider:   You will follow up in the [facility] located at [facility]. Your provider will be: Roderic Palau, NP or Clint R. Fenton, PA-C   Other Instructions  Echocardiogram An echocardiogram is a procedure that uses painless sound waves (ultrasound) to produce an image of the heart. Images from an echocardiogram can provide important information about:  Signs of coronary artery disease (CAD).  Aneurysm detection. An aneurysm is a weak or damaged part of an artery wall that bulges out from the normal force of blood pumping through the body.  Heart size and shape. Changes in the size or shape of the heart can be  associated with certain conditions, including heart failure, aneurysm, and CAD.  Heart muscle function.  Heart valve function.  Signs of a past heart attack.  Fluid buildup around the heart.  Thickening of the heart muscle.  A tumor or infectious growth around the heart valves. Tell a health care provider about:  Any allergies you have.  All medicines you are taking, including vitamins, herbs, eye drops, creams, and over-the-counter medicines.  Any blood disorders you have.  Any surgeries you have had.  Any medical conditions you have.  Whether you are pregnant or may be pregnant. What are the risks? Generally, this is a safe procedure. However, problems may occur, including:  Allergic reaction to dye (contrast) that may be used during the procedure. What happens before the procedure? No specific preparation is needed. You may eat and drink normally. What happens during the procedure?   An IV tube may be inserted into one of your veins.  You may receive contrast through this  tube. A contrast is an injection that improves the quality of the pictures from your heart.  A gel will be applied to your chest.  A wand-like tool (transducer) will be moved over your chest. The gel will help to transmit the sound waves from the transducer.  The sound waves will harmlessly bounce off of your heart to allow the heart images to be captured in real-time motion. The images will be recorded on a computer. The procedure may vary among health care providers and hospitals. What happens after the procedure?  You may return to your normal, everyday life, including diet, activities, and medicines, unless your health care provider tells you not to do that. Summary  An echocardiogram is a procedure that uses painless sound waves (ultrasound) to produce an image of the heart.  Images from an echocardiogram can provide important information about the size and shape of your heart, heart  muscle function, heart valve function, and fluid buildup around your heart.  You do not need to do anything to prepare before this procedure. You may eat and drink normally.  After the echocardiogram is completed, you may return to your normal, everyday life, unless your health care provider tells you not to do that. This information is not intended to replace advice given to you by your health care provider. Make sure you discuss any questions you have with your health care provider. Document Revised: 05/30/2018 Document Reviewed: 03/11/2016 Elsevier Patient Education  [place].

## 2019-12-11 ENCOUNTER — Ambulatory Visit (HOSPITAL_COMMUNITY)
Admission: RE | Admit: 2019-12-11 | Discharge: 2019-12-11 | Disposition: A | Discharge status: Home / Self Care | Payer: Medicare Other | Source: Ambulatory Visit | Attending: Cardiology | Admitting: Cardiology

## 2019-12-11 ENCOUNTER — Other Ambulatory Visit: Payer: Self-pay

## 2019-12-11 DIAGNOSIS — I480 Paroxysmal atrial fibrillation: Secondary | ICD-10-CM | POA: Insufficient documentation

## 2019-12-11 LAB — ECHOCARDIOGRAM COMPLETE
AR max vel: 1.65 cm2
AV Area VTI: 1.58 cm2
AV Area mean vel: 1.67 cm2
AV Mean grad: 8 mmHg
AV Peak grad: 16.2 mmHg
Ao pk vel: 2.01 m/s
Area-P 1/2: 4.21 cm2
P 1/2 time: 518 msec
S' Lateral: 2.44 cm

## 2019-12-11 NOTE — Progress Notes (Signed)
*  PRELIMINARY RESULTS* Echocardiogram 2D Echocardiogram has been performed.  Samuel Germany 12/11/2019, 11:25 AM

## 2019-12-15 ENCOUNTER — Telehealth: Payer: Self-pay

## 2019-12-15 NOTE — Telephone Encounter (Signed)
REFERRAL REQUEST Telephone Note  Have you been seen at our office for this problem? Yes (Advise that they may need an appointment with their PCP before a referral can be done)  Reason for Referral: Hip pain Referral discussed with patient: yes Best contact number of patient for referral team: [phone] Has patient been seen by a specialist for this issue before: a long time ago  Patient provider preference for referral:  Patient location preference for referral: [facility]   Patient notified that referrals can take up to a week or longer to process. If they haven't heard anything within a week they should call back and speak with the referral department.   Je's pt  Please call daughter-Enola

## 2019-12-16 ENCOUNTER — Other Ambulatory Visit: Payer: Self-pay

## 2019-12-16 ENCOUNTER — Ambulatory Visit (HOSPITAL_COMMUNITY)
Admission: RE | Admit: 2019-12-16 | Discharge: 2019-12-16 | Disposition: A | Discharge status: Home / Self Care | Payer: Medicare Other | Source: Ambulatory Visit | Attending: Physician Assistant | Admitting: Physician Assistant

## 2019-12-16 ENCOUNTER — Encounter (HOSPITAL_COMMUNITY): Payer: Self-pay | Admitting: Physician Assistant

## 2019-12-16 VITALS — BP 148/82 | HR 59 | Ht 65.0 in | Wt 178.2 lb

## 2019-12-16 DIAGNOSIS — Z7901 Long term (current) use of anticoagulants: Secondary | ICD-10-CM | POA: Diagnosis not present

## 2019-12-16 DIAGNOSIS — I10 Essential (primary) hypertension: Secondary | ICD-10-CM | POA: Diagnosis not present

## 2019-12-16 DIAGNOSIS — D6869 Other thrombophilia: Secondary | ICD-10-CM | POA: Diagnosis not present

## 2019-12-16 DIAGNOSIS — E032 Hypothyroidism due to medicaments and other exogenous substances: Secondary | ICD-10-CM | POA: Diagnosis not present

## 2019-12-16 DIAGNOSIS — I2510 Atherosclerotic heart disease of native coronary artery without angina pectoris: Secondary | ICD-10-CM | POA: Diagnosis not present

## 2019-12-16 DIAGNOSIS — Z79899 Other long term (current) drug therapy: Secondary | ICD-10-CM | POA: Diagnosis not present

## 2019-12-16 DIAGNOSIS — E119 Type 2 diabetes mellitus without complications: Secondary | ICD-10-CM | POA: Diagnosis not present

## 2019-12-16 DIAGNOSIS — I480 Paroxysmal atrial fibrillation: Secondary | ICD-10-CM | POA: Diagnosis not present

## 2019-12-16 DIAGNOSIS — T462X5D Adverse effect of other antidysrhythmic drugs, subsequent encounter: Secondary | ICD-10-CM | POA: Diagnosis not present

## 2019-12-16 NOTE — Progress Notes (Signed)
Primary Care Physician: Ivy Lynn, NP Primary Cardiologist: Dr Domenic Polite Primary Electrophysiologist: none Referring Physician: Dr Reinaldo Raddle [name] is a 79 y.o. female with a history of CAD, HTN, DM, HLD, and persistent atrial fibrillation who presents for follow up in the [facility].  The patient was initially diagnosed with atrial fibrillation on 11/2017 after presenting with symptoms of palpitations and chest pain to ER and found to be in afib with RVR. She was discharged with Eliquis and rate control with plan to cardiovert on f/u but she had converted on her own. She denies snoring or significant alcohol use. Patient was seen at the ER 11/11/18 with symptoms of SOB, chest tightness, and palpitations for 3 days. She was found to be in afib with RVR and underwent successful DCCV. Zio patch prior to this episode showed no afib but did show very frequent short episodes of SVT. She was started on amiodarone which controlled her afib but unfortunately she developed hypothyroidism and this was discontinued.   On follow up today, patient reports that about 2 weeks ago she had symptoms of heart racing and SOB two nights in a row. She took NTG. She denies chest pain. She was seen by Cecilie Kicks on 12/08/19 and a Zio patch was placed to evaluate for arrhythmias. Since then, the patient has not had recurrence of her symptoms.   Today, she denies symptoms of chest pain, orthopnea, PND, lower extremity edema, dizziness, presyncope, syncope, snoring, daytime somnolence, bleeding, or neurologic sequela. The patient is tolerating medications without difficulties and is otherwise without complaint today.    Atrial Fibrillation Risk Factors:  she does not have symptoms or diagnosis of sleep apnea. she does not have a history of rheumatic fever. she does not have a history of alcohol use. The patient does not have a history of early familial atrial fibrillation or  other arrhythmias.  she has a BMI of Body mass index is 29.65 kg/m.Marland Kitchen Filed Weights   12/16/19 0944  Weight: 80.8 kg    Family History  Problem Relation Age of Onset  . Other Mother        "natural causes"  . Heart disease Mother   . Cancer Father        unsure of type     Atrial Fibrillation Management history:  Previous antiarrhythmic drugs: amiodarone  Previous cardioversions: 11/11/18 Previous ablations: none CHADS2VASC score: 6 Anticoagulation history: Eliquis   Past Medical History:  Diagnosis Date  . Anxiety   . Arthritis   . Bulging lumbar disc   . Chronic lower back pain   . Coronary atherosclerosis of native coronary artery    DES LAD 02/2008, DES LAD 02/2015  . Depression   . Dyslipidemia   . Essential hypertension   . Facial numbness   . Frequent headaches   . GERD (gastroesophageal reflux disease)   . Glaucoma   . Insomnia   . Paroxysmal atrial fibrillation [facility])    Diagnosed October 2019  . PSVT (paroxysmal supraventricular tachycardia) ([place])   . Refusal of blood transfusions as patient is Jehovah's Witness   . Type 2 diabetes mellitus (HCC)    Borderline  . Vitamin D deficiency    Past Surgical History:  Procedure Laterality Date  . CARDIAC CATHETERIZATION N/A 02/23/2015   Procedure: Left Heart Cath and Coronary Angiography;  Surgeon: Sherren Mocha, MD;  Location: [place] [facility];  Service: Cardiovascular;  Laterality: N/A;  . CARDIAC CATHETERIZATION  N/A 02/23/2015   Procedure: Coronary Stent Intervention;  Surgeon: Sherren Mocha, MD;  Location: [place] [facility];  Service: Cardiovascular;  Laterality: N/A;  . CARDIAC CATHETERIZATION N/A 01/04/2016   Procedure: Left Heart Cath and Coronary Angiography;  Surgeon: Peter M Martinique, MD;  Location: [place] [facility];  Service: Cardiovascular;  Laterality: N/A;  . CATARACT EXTRACTION    . CORONARY ANGIOPLASTY  02/23/2015  . CORONARY ANGIOPLASTY WITH STENT PLACEMENT  2009  . DILATION AND CURETTAGE OF  UTERUS    . TUBAL LIGATION      Current Outpatient Medications  Medication Sig Dispense Refill  . acetaminophen (TYLENOL) 650 MG CR tablet Take 1,300 mg by mouth every 8 (eight) hours as needed for pain.    Marland Kitchen amitriptyline (ELAVIL) 10 MG tablet Take 1 tablet (10 mg total) by mouth at bedtime. 60 tablet 2  . apixaban (ELIQUIS) 5 MG TABS tablet Take 1 tablet (5 mg total) by mouth 2 (two) times daily. 60 tablet 11  . Ascorbic Acid (VITAMIN C) 500 MG tablet Take 500 mg by mouth daily.      . bimatoprost (LUMIGAN) 0.01 % SOLN Place 1 drop into both eyes at bedtime.    . blood glucose meter kit and supplies Dispense based on patient and insurance preference. Use up to four times daily as directed. (FOR ICD-10 E10.9, E11.9). 1 each 0  . Calcium Carbonate Antacid (TUMS PO) Take 2 tablets by mouth 4 (four) times daily as needed (for acid reflux/indigestion).     . Cholecalciferol (VITAMIN D) 400 UNITS capsule Take 400 Units by mouth daily.      . Cyanocobalamin (VITAMIN B 12 PO) Take 1 tablet by mouth daily.    Marland Kitchen diltiazem (CARDIZEM CD) 180 MG 24 hr capsule TAKE ONE (1) CAPSULE EACH DAY 90 capsule 3  . Garlic 803 MG TABS Take 1 tablet by mouth daily.      Marland Kitchen HYDROcodone-acetaminophen (NORCO) 10-325 MG tablet Take 1 tablet by mouth 4 (four) times daily as needed. For pain.    Marland Kitchen ibuprofen (ADVIL,MOTRIN) 200 MG tablet Take 600 mg by mouth every 6 (six) hours as needed (for pain.).     Marland Kitchen losartan (COZAAR) 25 MG tablet Take 1 tablet (25 mg total) by mouth daily. 90 tablet 1  . MAGNESIUM CARBONATE PO Take 1 tablet by mouth daily. 400 mg    . Menthol, Topical Analgesic, (BIOFREEZE) 4 % GEL Apply 1 application topically 4 (four) times daily as needed (for pain).     . metFORMIN (GLUCOPHAGE-XR) 500 MG 24 hr tablet Take 1 tablet by mouth daily.     . metoprolol tartrate (LOPRESSOR) 100 MG tablet Take 1 tablet (100 mg total) by mouth 2 (two) times daily. 180 tablet 3  . nitroGLYCERIN (NITROSTAT) 0.4 MG SL tablet  Place 0.4 mg under the tongue every 5 (five) minutes as needed for chest pain.    . pantoprazole (PROTONIX) 40 MG tablet TAKE ONE (1) TABLET EACH DAY 90 tablet 2  . promethazine (PHENERGAN) 25 MG tablet 1 tablet as needed.    . rosuvastatin (CRESTOR) 5 MG tablet Take 1 tablet (5 mg total) by mouth daily. 90 tablet 1  . timolol (TIMOPTIC) 0.5 % ophthalmic solution Place 1 drop into both eyes daily.    . traZODone (DESYREL) 100 MG tablet Take 1 tablet by mouth at bedtime.     . TURMERIC PO Take 1 tablet by mouth daily.    . VENTOLIN HFA 108 (90  Base) MCG/ACT inhaler Inhale 1-2 puffs into the lungs every 4 (four) hours as needed for shortness of breath.    . hydrocortisone 2.5 % cream Apply 2 (two) times daily topically. (Patient not taking: Reported on 12/16/2019) 30 g 0   No current facility-administered medications for this encounter.    No Known Allergies  Social History   Socioeconomic History  . Marital status: Widowed    Spouse name: Not on file  . Number of children: 8  . Years of education: 10th grade  . Highest education level: Not on file  Occupational History  . Occupation: RETIRED  Tobacco Use  . Smoking status: Never Smoker  . Smokeless tobacco: Never Used  Vaping Use  . Vaping Use: Never used  Substance and Sexual Activity  . Alcohol use: No    Alcohol/week: 0.0 standard drinks    Comment: RARE OCCASION  . Drug use: No  . Sexual activity: Never  Other Topics Concern  . Not on file  Social History Narrative   Lives at home alone.   Right-handed.   No caffeine use.   Social Determinants of Health   Financial Resource Strain:   . Difficulty of Paying Living Expenses: Not on file  Food Insecurity:   . Worried About Programme researcher, broadcasting/film/video in the Last Year: Not on file  . Ran Out of Food in the Last Year: Not on file  Transportation Needs:   . Lack of Transportation (Medical): Not on file  . Lack of Transportation (Non-Medical): Not on file  Physical Activity:    . Days of Exercise per Week: Not on file  . Minutes of Exercise per Session: Not on file  Stress:   . Feeling of Stress : Not on file  Social Connections:   . Frequency of Communication with Friends and Family: Not on file  . Frequency of Social Gatherings with Friends and Family: Not on file  . Attends Religious Services: Not on file  . Active Member of Clubs or Organizations: Not on file  . Attends Banker Meetings: Not on file  . Marital Status: Not on file  Intimate Partner Violence:   . Fear of Current or Ex-Partner: Not on file  . Emotionally Abused: Not on file  . Physically Abused: Not on file  . Sexually Abused: Not on file     ROS- All systems are reviewed and negative except as per the HPI above.  Physical Exam: Vitals:   12/16/19 0944  BP: (!) 148/82  Pulse: (!) 59  Weight: 80.8 kg  Height: 5\' 5"  (1.651 m)    GEN- The patient is well appearing elderly female, alert and oriented x 3 today.   HEENT-head normocephalic, atraumatic, sclera clear, conjunctiva pink, hearing intact, trachea midline. Lungs- Clear to ausculation bilaterally, normal work of breathing Heart- Regular rate and rhythm, no murmurs, rubs or gallops  GI- soft, NT, ND, + BS Extremities- no clubbing, cyanosis, or edema MS- no significant deformity or atrophy Skin- no rash or lesion Psych- euthymic mood, full affect Neuro- strength and sensation are intact   Wt Readings from Last 3 Encounters:  12/16/19 80.8 kg  12/08/19 79.3 kg  11/20/19 78.9 kg    EKG today demonstrates SB HR 59, 1st degree AV block, PR 226, QRS 72, QTc 437  Echo 12/11/19 demonstrated  1. Left ventricular ejection fraction, by estimation, is 65 to 70%. The  left ventricle has normal function. The left ventricle has no regional  wall  motion abnormalities. There is mild left ventricular hypertrophy.  Left ventricular diastolic parameters  are consistent with Grade II diastolic dysfunction  (pseudonormalization).  Elevated left atrial pressure.  2. Right ventricular systolic function is normal. The right ventricular  size is normal. There is mildly elevated pulmonary artery systolic  pressure.  3. The mitral valve is normal in structure. Mild mitral valve  regurgitation. No evidence of mitral stenosis. Moderate mitral annular  calcification.  4. The aortic valve has an indeterminant number of cusps. There is mild  calcification of the aortic valve. There is mild thickening of the aortic  valve. Aortic valve regurgitation is mild. No aortic stenosis is present.  5. Mild pulmonary HTN, PASP is 36 mmHg.  6. The inferior vena cava is normal in size with greater than 50%  respiratory variability, suggesting right atrial pressure of 3 mmHg.    Epic records are reviewed at length today   Assessment and Plan: 1. Paroxysmal atrial fibrillation/SVT Patient having more frequent episodes of heart racing. Recall amiodarone d/c 2/2 elevated TSH. Would avoid class IC with h/o CAD. We discussed therapeutic options today including dofetilide vs ablation. She would need to be off amiodarone for 3 months before dofetilide loading (stopped 11/11/19).  She is currently wearing Zio patch. Will wait for results and in the meantime patient will discuss options with her family.  Continue Eliquis 5 mg BID Continue diltiazem 180 mg daily Continue Lopressor 100 mg BID  This patients CHA2DS2-VASc Score and unadjusted Ischemic Stroke Rate (% per year) is equal to 9.7 % stroke rate/year from a score of 6  Above score calculated as 1 point each if present [CHF, HTN, DM, Vascular=MI/PAD/Aortic Plaque, Age if 65-74, or Female] Above score calculated as 2 points each if present [Age > 75, or Stroke/TIA/TE]  2. CAD S/p DES to LAD 2010 and 2017 Myoview 08/29/18 showed no ischemia, EF 62% No anginal symptoms today.  3. HTN Stable, no changes today.   Follow up in the AF clinic in 3 weeks.     [facility] [address] [place], [place] [address] [phone] 12/16/2019 10:32 AM

## 2019-12-22 ENCOUNTER — Other Ambulatory Visit: Payer: Self-pay | Admitting: Cardiology

## 2019-12-22 NOTE — Telephone Encounter (Signed)
I reached out to patient over the phone patient will call to make an appointment for next week.

## 2019-12-23 ENCOUNTER — Other Ambulatory Visit (HOSPITAL_COMMUNITY): Payer: Medicare Other

## 2019-12-30 ENCOUNTER — Ambulatory Visit: Payer: Medicare Other | Admitting: Cardiology

## 2019-12-31 ENCOUNTER — Ambulatory Visit (INDEPENDENT_AMBULATORY_CARE_PROVIDER_SITE_OTHER): Payer: Medicare Other

## 2019-12-31 ENCOUNTER — Other Ambulatory Visit: Payer: Self-pay

## 2019-12-31 DIAGNOSIS — Z23 Encounter for immunization: Secondary | ICD-10-CM | POA: Diagnosis not present

## 2019-12-31 NOTE — Progress Notes (Signed)
Patient given third moderna and tolerated well.

## 2020-01-06 ENCOUNTER — Encounter (HOSPITAL_COMMUNITY): Payer: Self-pay | Admitting: Physician Assistant

## 2020-01-06 ENCOUNTER — Ambulatory Visit (HOSPITAL_COMMUNITY)
Admission: RE | Admit: 2020-01-06 | Discharge: 2020-01-06 | Disposition: A | Discharge status: Home / Self Care | Payer: Medicare Other | Source: Ambulatory Visit | Attending: Physician Assistant | Admitting: Physician Assistant

## 2020-01-06 ENCOUNTER — Other Ambulatory Visit: Payer: Self-pay

## 2020-01-06 VITALS — BP 162/92 | HR 54 | Ht 65.0 in | Wt 177.8 lb

## 2020-01-06 DIAGNOSIS — I10 Essential (primary) hypertension: Secondary | ICD-10-CM | POA: Insufficient documentation

## 2020-01-06 DIAGNOSIS — T462X5D Adverse effect of other antidysrhythmic drugs, subsequent encounter: Secondary | ICD-10-CM | POA: Diagnosis not present

## 2020-01-06 DIAGNOSIS — I471 Supraventricular tachycardia: Secondary | ICD-10-CM | POA: Diagnosis not present

## 2020-01-06 DIAGNOSIS — D6869 Other thrombophilia: Secondary | ICD-10-CM | POA: Diagnosis not present

## 2020-01-06 DIAGNOSIS — Z79899 Other long term (current) drug therapy: Secondary | ICD-10-CM | POA: Diagnosis not present

## 2020-01-06 DIAGNOSIS — E032 Hypothyroidism due to medicaments and other exogenous substances: Secondary | ICD-10-CM | POA: Insufficient documentation

## 2020-01-06 DIAGNOSIS — I480 Paroxysmal atrial fibrillation: Secondary | ICD-10-CM

## 2020-01-06 DIAGNOSIS — I2510 Atherosclerotic heart disease of native coronary artery without angina pectoris: Secondary | ICD-10-CM | POA: Diagnosis not present

## 2020-01-06 DIAGNOSIS — Z7984 Long term (current) use of oral hypoglycemic drugs: Secondary | ICD-10-CM | POA: Insufficient documentation

## 2020-01-06 DIAGNOSIS — Z955 Presence of coronary angioplasty implant and graft: Secondary | ICD-10-CM | POA: Diagnosis not present

## 2020-01-06 DIAGNOSIS — E119 Type 2 diabetes mellitus without complications: Secondary | ICD-10-CM | POA: Diagnosis not present

## 2020-01-06 DIAGNOSIS — Z8249 Family history of ischemic heart disease and other diseases of the circulatory system: Secondary | ICD-10-CM | POA: Diagnosis not present

## 2020-01-06 NOTE — Progress Notes (Signed)
Primary Care Physician: Ivy Lynn, NP Primary Cardiologist: Dr Domenic Polite Primary Electrophysiologist: none Referring Physician: Dr Reinaldo Raddle [name] is a 79 y.o. female with a history of CAD, HTN, DM, HLD, and persistent atrial fibrillation who presents for follow up in the [facility].  The patient was initially diagnosed with atrial fibrillation on 11/2017 after presenting with symptoms of palpitations and chest pain to ER and found to be in afib with RVR. She was discharged with Eliquis and rate control with plan to cardiovert on f/u but she had converted on her own. She denies snoring or significant alcohol use. Patient was seen at the ER 11/11/18 with symptoms of SOB, chest tightness, and palpitations for 3 days. She was found to be in afib with RVR and underwent successful DCCV. Zio patch prior to this episode showed no afib but did show very frequent short episodes of SVT. She was started on amiodarone which controlled her afib but unfortunately she developed hypothyroidism and this was discontinued.   She was seen by Cecilie Kicks on 12/08/19 and a Zio patch was placed to evaluate for arrhythmias. These results are pending. On follow up today, patient has not had further heart racing symptoms. She denies any bleeding issues on anticoagulation.   Today, she denies symptoms of palpitations, chest pain, orthopnea, PND, lower extremity edema, dizziness, presyncope, syncope, snoring, daytime somnolence, bleeding, or neurologic sequela. The patient is tolerating medications without difficulties and is otherwise without complaint today.    Atrial Fibrillation Risk Factors:  she does not have symptoms or diagnosis of sleep apnea. she does not have a history of rheumatic fever. she does not have a history of alcohol use. The patient does not have a history of early familial atrial fibrillation or other arrhythmias.  she has a BMI of Body mass index is  29.59 kg/m.Marland Kitchen Filed Weights   01/06/20 1033  Weight: 80.6 kg    Family History  Problem Relation Age of Onset  . Other Mother        "natural causes"  . Heart disease Mother   . Cancer Father        unsure of type     Atrial Fibrillation Management history:  Previous antiarrhythmic drugs: amiodarone  Previous cardioversions: 11/11/18 Previous ablations: none CHADS2VASC score: 6 Anticoagulation history: Eliquis   Past Medical History:  Diagnosis Date  . Anxiety   . Arthritis   . Bulging lumbar disc   . Chronic lower back pain   . Coronary atherosclerosis of native coronary artery    DES LAD 02/2008, DES LAD 02/2015  . Depression   . Dyslipidemia   . Essential hypertension   . Facial numbness   . Frequent headaches   . GERD (gastroesophageal reflux disease)   . Glaucoma   . Insomnia   . Paroxysmal atrial fibrillation [facility])    Diagnosed October 2019  . PSVT (paroxysmal supraventricular tachycardia) ([place])   . Refusal of blood transfusions as patient is Jehovah's Witness   . Type 2 diabetes mellitus (HCC)    Borderline  . Vitamin D deficiency    Past Surgical History:  Procedure Laterality Date  . CARDIAC CATHETERIZATION N/A 02/23/2015   Procedure: Left Heart Cath and Coronary Angiography;  Surgeon: Sherren Mocha, MD;  Location: [place] [facility];  Service: Cardiovascular;  Laterality: N/A;  . CARDIAC CATHETERIZATION N/A 02/23/2015   Procedure: Coronary Stent Intervention;  Surgeon: Sherren Mocha, MD;  Location: [facility] INVASIVE CV  [facility];  Service: Cardiovascular;  Laterality: N/A;  . CARDIAC CATHETERIZATION N/A 01/04/2016   Procedure: Left Heart Cath and Coronary Angiography;  Surgeon: Peter M Martinique, MD;  Location: [place] [facility];  Service: Cardiovascular;  Laterality: N/A;  . CATARACT EXTRACTION    . CORONARY ANGIOPLASTY  02/23/2015  . CORONARY ANGIOPLASTY WITH STENT PLACEMENT  2009  . DILATION AND CURETTAGE OF UTERUS    . TUBAL LIGATION      Current Outpatient  Medications  Medication Sig Dispense Refill  . acetaminophen (TYLENOL) 650 MG CR tablet Take 1,300 mg by mouth every 8 (eight) hours as needed for pain.    Marland Kitchen amitriptyline (ELAVIL) 10 MG tablet Take 1 tablet (10 mg total) by mouth at bedtime. 60 tablet 2  . apixaban (ELIQUIS) 5 MG TABS tablet Take 1 tablet (5 mg total) by mouth 2 (two) times daily. 60 tablet 11  . Ascorbic Acid (VITAMIN C) 500 MG tablet Take 500 mg by mouth daily.      . bimatoprost (LUMIGAN) 0.01 % SOLN Place 1 drop into both eyes at bedtime.    . blood glucose meter kit and supplies Dispense based on patient and insurance preference. Use up to four times daily as directed. (FOR ICD-10 E10.9, E11.9). 1 each 0  . Calcium Carbonate Antacid (TUMS PO) Take 2 tablets by mouth 4 (four) times daily as needed (for acid reflux/indigestion).     . Cholecalciferol (VITAMIN D) 400 UNITS capsule Take 400 Units by mouth daily.      . Cyanocobalamin (VITAMIN B 12 PO) Take 1 tablet by mouth daily.    Marland Kitchen diltiazem (CARDIZEM CD) 180 MG 24 hr capsule TAKE ONE (1) CAPSULE EACH DAY 90 capsule 3  . Garlic 449 MG TABS Take 1 tablet by mouth daily.      Marland Kitchen HYDROcodone-acetaminophen (NORCO) 10-325 MG tablet Take 1 tablet by mouth 4 (four) times daily as needed. For pain.    Marland Kitchen ibuprofen (ADVIL,MOTRIN) 200 MG tablet Take 600 mg by mouth every 6 (six) hours as needed (for pain.).     Marland Kitchen losartan (COZAAR) 25 MG tablet TAKE ONE (1) TABLET EACH DAY 90 tablet 1  . MAGNESIUM CARBONATE PO Take 1 tablet by mouth daily. 400 mg    . Menthol, Topical Analgesic, (BIOFREEZE) 4 % GEL Apply 1 application topically 4 (four) times daily as needed (for pain).     . metFORMIN (GLUCOPHAGE-XR) 500 MG 24 hr tablet Take 1 tablet by mouth daily.     . metoprolol tartrate (LOPRESSOR) 100 MG tablet Take 1 tablet (100 mg total) by mouth 2 (two) times daily. 180 tablet 3  . nitroGLYCERIN (NITROSTAT) 0.4 MG SL tablet Place 0.4 mg under the tongue every 5 (five) minutes as needed for  chest pain.    . pantoprazole (PROTONIX) 40 MG tablet TAKE ONE (1) TABLET EACH DAY 90 tablet 2  . promethazine (PHENERGAN) 25 MG tablet 1 tablet as needed.    . rosuvastatin (CRESTOR) 5 MG tablet Take 1 tablet (5 mg total) by mouth daily. 90 tablet 1  . timolol (TIMOPTIC) 0.5 % ophthalmic solution Place 1 drop into both eyes daily.    . traZODone (DESYREL) 100 MG tablet Take 1 tablet by mouth at bedtime.     . TURMERIC PO Take 1 tablet by mouth daily.    . VENTOLIN HFA 108 (90 Base) MCG/ACT inhaler Inhale 1-2 puffs into the lungs every 4 (four) hours as needed for shortness of breath.  No current facility-administered medications for this encounter.    No Known Allergies  Social History   Socioeconomic History  . Marital status: Widowed    Spouse name: Not on file  . Number of children: 8  . Years of education: 10th grade  . Highest education level: Not on file  Occupational History  . Occupation: RETIRED  Tobacco Use  . Smoking status: Never Smoker  . Smokeless tobacco: Never Used  Vaping Use  . Vaping Use: Never used  Substance and Sexual Activity  . Alcohol use: Yes    Alcohol/week: 1.0 standard drink    Types: 1 Glasses of wine per week    Comment: RARE OCCASION  . Drug use: No  . Sexual activity: Never  Other Topics Concern  . Not on file  Social History Narrative   Lives at home alone.   Right-handed.   No caffeine use.   Social Determinants of Health   Financial Resource Strain:   . Difficulty of Paying Living Expenses: Not on file  Food Insecurity:   . Worried About Charity fundraiser in the Last Year: Not on file  . Ran Out of Food in the Last Year: Not on file  Transportation Needs:   . Lack of Transportation (Medical): Not on file  . Lack of Transportation (Non-Medical): Not on file  Physical Activity:   . Days of Exercise per Week: Not on file  . Minutes of Exercise per Session: Not on file  Stress:   . Feeling of Stress : Not on file  Social  Connections:   . Frequency of Communication with Friends and Family: Not on file  . Frequency of Social Gatherings with Friends and Family: Not on file  . Attends Religious Services: Not on file  . Active Member of Clubs or Organizations: Not on file  . Attends Archivist Meetings: Not on file  . Marital Status: Not on file  Intimate Partner Violence:   . Fear of Current or Ex-Partner: Not on file  . Emotionally Abused: Not on file  . Physically Abused: Not on file  . Sexually Abused: Not on file     ROS- All systems are reviewed and negative except as per the HPI above.  Physical Exam: Vitals:   01/06/20 1033  BP: (!) 162/92  Pulse: (!) 54  Weight: 80.6 kg  Height: 5' 5" (1.651 m)    GEN- The patient is well appearing, alert and oriented x 3 today.   HEENT-head normocephalic, atraumatic, sclera clear, conjunctiva pink, hearing intact, trachea midline. Lungs- Clear to ausculation bilaterally, normal work of breathing Heart- Regular rate and rhythm, no murmurs, rubs or gallops  GI- soft, NT, ND, + BS Extremities- no clubbing, cyanosis, or edema MS- no significant deformity or atrophy Skin- no rash or lesion Psych- euthymic mood, full affect Neuro- strength and sensation are intact   Wt Readings from Last 3 Encounters:  01/06/20 80.6 kg  12/16/19 80.8 kg  12/08/19 79.3 kg    EKG today demonstrates SB HR 54, 1st degree AV block, PR 212, QRS 68, QTc 436  Echo 12/11/19 demonstrated  1. Left ventricular ejection fraction, by estimation, is 65 to 70%. The  left ventricle has normal function. The left ventricle has no regional  wall motion abnormalities. There is mild left ventricular hypertrophy.  Left ventricular diastolic parameters  are consistent with Grade II diastolic dysfunction (pseudonormalization).  Elevated left atrial pressure.  2. Right ventricular systolic function is normal. The  right ventricular  size is normal. There is mildly elevated  pulmonary artery systolic  pressure.  3. The mitral valve is normal in structure. Mild mitral valve  regurgitation. No evidence of mitral stenosis. Moderate mitral annular  calcification.  4. The aortic valve has an indeterminant number of cusps. There is mild  calcification of the aortic valve. There is mild thickening of the aortic  valve. Aortic valve regurgitation is mild. No aortic stenosis is present.  5. Mild pulmonary HTN, PASP is 36 mmHg.  6. The inferior vena cava is normal in size with greater than 50%  respiratory variability, suggesting right atrial pressure of 3 mmHg.    Epic records are reviewed at length today   Assessment and Plan: 1. Paroxysmal atrial fibrillation/SVT Patient having more frequent episodes of heart racing. Recall amiodarone d/c 2/2 elevated TSH (stopped 11/11/19) Would avoid class IC with h/o CAD. Zio patch results pending. We discussed therapeutic options again today including dofetilide vs ablation. After discussion with her family, she would like to be considered for ablation. Will refer to EP. Continue Eliquis 5 mg BID Continue diltiazem 180 mg daily Continue Lopressor 100 mg BID  This patients CHA2DS2-VASc Score and unadjusted Ischemic Stroke Rate (% per year) is equal to 9.7 % stroke rate/year from a score of 6  Above score calculated as 1 point each if present [CHF, HTN, DM, Vascular=MI/PAD/Aortic Plaque, Age if 65-74, or Female] Above score calculated as 2 points each if present [Age > 75, or Stroke/TIA/TE]  2. CAD S/p DES to LAD 2010 and 2017 Myoview 08/29/18 showed no ischemia, EF 62% No anginal symptoms.  3. HTN Elevated today, has been better controlled at last two visits. Encouraged patient to keep BP log for review.    Follow up with EP to consider ablation.    [facility] [address] [place],  [address] [phone] 01/06/2020 10:49 AM

## 2020-01-08 ENCOUNTER — Other Ambulatory Visit (INDEPENDENT_AMBULATORY_CARE_PROVIDER_SITE_OTHER): Payer: Medicare Other

## 2020-01-08 ENCOUNTER — Other Ambulatory Visit: Payer: Self-pay

## 2020-01-08 DIAGNOSIS — I480 Paroxysmal atrial fibrillation: Secondary | ICD-10-CM

## 2020-01-08 DIAGNOSIS — R001 Bradycardia, unspecified: Secondary | ICD-10-CM

## 2020-01-08 NOTE — Addendum Note (Signed)
Addended by: Levonne Hubert on: 01/08/2020 10:26 AM   Modules accepted: Orders

## 2020-01-09 ENCOUNTER — Encounter: Payer: Self-pay | Admitting: Nurse Practitioner

## 2020-01-09 ENCOUNTER — Other Ambulatory Visit: Payer: Self-pay

## 2020-01-09 ENCOUNTER — Ambulatory Visit (INDEPENDENT_AMBULATORY_CARE_PROVIDER_SITE_OTHER): Payer: Medicare Other | Admitting: Nurse Practitioner

## 2020-01-09 VITALS — BP 182/70 | HR 56 | Temp 97.3°F | Ht 65.0 in | Wt 183.4 lb

## 2020-01-09 DIAGNOSIS — M79605 Pain in left leg: Secondary | ICD-10-CM | POA: Diagnosis not present

## 2020-01-09 DIAGNOSIS — M549 Dorsalgia, unspecified: Secondary | ICD-10-CM

## 2020-01-09 MED ORDER — MELOXICAM 7.5 MG PO TABS: 7.5000 mg | ORAL_TABLET | Freq: Every day | ORAL | 0 refills | Status: DC

## 2020-01-09 MED ORDER — LIDOCAINE 5 % EX PTCH: 1.0000 | MEDICATED_PATCH | CUTANEOUS | 0 refills | Status: DC

## 2020-01-09 MED ORDER — DICLOFENAC SODIUM 1 % EX GEL: 2.0000 g | Freq: Four times a day (QID) | CUTANEOUS | 2 refills | Status: AC

## 2020-01-09 NOTE — Assessment & Plan Note (Signed)
Back pain with left side sciatica not well controlled on current medication.  Started patient on 5% lidocaine patch every 24 hours.  Completed referral to orthopedic for reevaluation.  Provided education with printed handouts given.  Advised patient to follow-up with worsening or unresolved symptoms.

## 2020-01-09 NOTE — Progress Notes (Signed)
Established Patient Office Visit  Subjective:  Patient ID: [name], female    DOB: 20-Dec-1940  Age: 79 y.o. MRN: [id number]  CC:  Chief Complaint  Patient presents with  . Arthritis    back & bilateral hips. Patient states this has been ongoing and wants to know if it is worse and what needs to be done.    HPI [name] presents for Pain  She reports chronic left lower back , hip and left legg pain. was not an injury that may have caused the pain. The pain started 13 years ago and is rapidly worsening. The pain does radiate to left  lower extremities. The pain is described as aching, pinching, throbbing and tingling, is severe in intensity, occurring constantly. Symptoms are worse in the: morning, mid-day, afternoon, evening  Aggravating factors: bending backwards, bending forwards, bending sideways, sitting, standing and walking Relieving factors: medication Norco .  She has tried prescription pain relievers with little relief.   ---------------------------------------------------------------------------------------------------   Past Medical History:  Diagnosis Date  . Anxiety   . Arthritis   . Bulging lumbar disc   . Chronic lower back pain   . Coronary atherosclerosis of native coronary artery    DES LAD 02/2008, DES LAD 02/2015  . Depression   . Dyslipidemia   . Essential hypertension   . Facial numbness   . Frequent headaches   . GERD (gastroesophageal reflux disease)   . Glaucoma   . Insomnia   . Paroxysmal atrial fibrillation [facility])    Diagnosed October 2019  . PSVT (paroxysmal supraventricular tachycardia) ([place])   . Refusal of blood transfusions as patient is Jehovah's Witness   . Type 2 diabetes mellitus (HCC)    Borderline  . Vitamin D deficiency     Past Surgical History:  Procedure Laterality Date  . CARDIAC CATHETERIZATION N/A 02/23/2015   Procedure: Left Heart Cath and Coronary Angiography;  Surgeon: Sherren Mocha, MD;  Location: [facility];  Service: Cardiovascular;  Laterality: N/A;  . CARDIAC CATHETERIZATION N/A 02/23/2015   Procedure: Coronary Stent Intervention;  Surgeon: Sherren Mocha, MD;  Location: [place] [facility];  Service: Cardiovascular;  Laterality: N/A;  . CARDIAC CATHETERIZATION N/A 01/04/2016   Procedure: Left Heart Cath and Coronary Angiography;  Surgeon: Peter M Martinique, MD;  Location: [place] [facility];  Service: Cardiovascular;  Laterality: N/A;  . CATARACT EXTRACTION    . CORONARY ANGIOPLASTY  02/23/2015  . CORONARY ANGIOPLASTY WITH STENT PLACEMENT  2009  . DILATION AND CURETTAGE OF UTERUS    . TUBAL LIGATION      Family History  Problem Relation Age of Onset  . Other Mother        "natural causes"  . Heart disease Mother   . Cancer Father        unsure of type    Social History   Socioeconomic History  . Marital status: Widowed    Spouse name: Not on file  . Number of children: 8  . Years of education: 10th grade  . Highest education level: Not on file  Occupational History  . Occupation: RETIRED  Tobacco Use  . Smoking status: Never Smoker  . Smokeless tobacco: Never Used  Vaping Use  . Vaping Use: Never used  Substance and Sexual Activity  . Alcohol use: Yes    Alcohol/week: 1.0 standard drink    Types: 1 Glasses of wine per week    Comment: RARE OCCASION  .  Drug use: No  . Sexual activity: Never  Other Topics Concern  . Not on file  Social History Narrative   Lives at home alone.   Right-handed.   No caffeine use.   Social Determinants of Health   Financial Resource Strain:   . Difficulty of Paying Living Expenses: Not on file  Food Insecurity:   . Worried About Running Out of Food in the Last Year: Not on file  . Ran Out of Food in the Last Year: Not on file  Transportation Needs:   . Lack of Transportation (Medical): Not on file  . Lack of Transportation (Non-Medical): Not on file  Physical Activity:   . Days of Exercise per Week: Not on file  .  Minutes of Exercise per Session: Not on file  Stress:   . Feeling of Stress : Not on file  Social Connections:   . Frequency of Communication with Friends and Family: Not on file  . Frequency of Social Gatherings with Friends and Family: Not on file  . Attends Religious Services: Not on file  . Active Member of Clubs or Organizations: Not on file  . Attends Club or Organization Meetings: Not on file  . Marital Status: Not on file  Intimate Partner Violence:   . Fear of Current or Ex-Partner: Not on file  . Emotionally Abused: Not on file  . Physically Abused: Not on file  . Sexually Abused: Not on file    Outpatient Medications Prior to Visit  Medication Sig Dispense Refill  . acetaminophen (TYLENOL) 650 MG CR tablet Take 1,300 mg by mouth every 8 (eight) hours as needed for pain.    . amitriptyline (ELAVIL) 10 MG tablet Take 1 tablet (10 mg total) by mouth at bedtime. 60 tablet 2  . apixaban (ELIQUIS) 5 MG TABS tablet Take 1 tablet (5 mg total) by mouth 2 (two) times daily. 60 tablet 11  . Ascorbic Acid (VITAMIN C) 500 MG tablet Take 500 mg by mouth daily.      . bimatoprost (LUMIGAN) 0.01 % SOLN Place 1 drop into both eyes at bedtime.    . blood glucose meter kit and supplies Dispense based on patient and insurance preference. Use up to four times daily as directed. (FOR ICD-10 E10.9, E11.9). 1 each 0  . Calcium Carbonate Antacid (TUMS PO) Take 2 tablets by mouth 4 (four) times daily as needed (for acid reflux/indigestion).     . Cholecalciferol (VITAMIN D) 400 UNITS capsule Take 400 Units by mouth daily.      . Cyanocobalamin (VITAMIN B 12 PO) Take 1 tablet by mouth daily.    . diltiazem (CARDIZEM CD) 180 MG 24 hr capsule TAKE ONE (1) CAPSULE EACH DAY 90 capsule 3  . Garlic 100 MG TABS Take 1 tablet by mouth daily.      . HYDROcodone-acetaminophen (NORCO) 10-325 MG tablet Take 1 tablet by mouth 4 (four) times daily as needed. For pain.    . ibuprofen (ADVIL,MOTRIN) 200 MG tablet  Take 600 mg by mouth every 6 (six) hours as needed (for pain.).     . losartan (COZAAR) 25 MG tablet TAKE ONE (1) TABLET EACH DAY 90 tablet 1  . MAGNESIUM CARBONATE PO Take 1 tablet by mouth daily. 400 mg    . Menthol, Topical Analgesic, (BIOFREEZE) 4 % GEL Apply 1 application topically 4 (four) times daily as needed (for pain).     . metFORMIN (GLUCOPHAGE-XR) 500 MG 24 hr tablet Take 1 tablet by   mouth daily.     . metoprolol tartrate (LOPRESSOR) 100 MG tablet Take 1 tablet (100 mg total) by mouth 2 (two) times daily. 180 tablet 3  . nitroGLYCERIN (NITROSTAT) 0.4 MG SL tablet Place 0.4 mg under the tongue every 5 (five) minutes as needed for chest pain.    . pantoprazole (PROTONIX) 40 MG tablet TAKE ONE (1) TABLET EACH DAY 90 tablet 2  . promethazine (PHENERGAN) 25 MG tablet 1 tablet as needed.    . rosuvastatin (CRESTOR) 5 MG tablet Take 1 tablet (5 mg total) by mouth daily. 90 tablet 1  . timolol (TIMOPTIC) 0.5 % ophthalmic solution Place 1 drop into both eyes daily.    . traZODone (DESYREL) 100 MG tablet Take 1 tablet by mouth at bedtime.     . TURMERIC PO Take 1 tablet by mouth daily.    . VENTOLIN HFA 108 (90 Base) MCG/ACT inhaler Inhale 1-2 puffs into the lungs every 4 (four) hours as needed for shortness of breath.     No facility-administered medications prior to visit.    No Known Allergies  ROS Review of Systems  Musculoskeletal: Positive for back pain and joint swelling.  Psychiatric/Behavioral: Negative for self-injury, sleep disturbance and suicidal ideas. The patient is not nervous/anxious.   All other systems reviewed and are negative.     Objective:    Physical Exam Vitals reviewed. Exam conducted with a chaperone present.  Constitutional:      Appearance: Normal appearance.  HENT:     Head: Normocephalic.  Eyes:     Conjunctiva/sclera: Conjunctivae normal.  Cardiovascular:     Rate and Rhythm: Normal rate.     Pulses: Normal pulses.     Heart sounds: Normal  heart sounds.  Pulmonary:     Effort: Pulmonary effort is normal.     Breath sounds: Normal breath sounds.  Abdominal:     General: Bowel sounds are normal.  Musculoskeletal:        General: Tenderness present.     Cervical back: Normal range of motion.  Neurological:     Mental Status: She is alert and oriented to person, place, and time.  Psychiatric:        Mood and Affect: Mood normal.     BP (!) 182/70   Pulse (!) 56   Temp (!) 97.3 F (36.3 C) (Temporal)   Ht 5' 5" (1.651 m)   Wt 183 lb 6.4 oz (83.2 kg)   LMP  (LMP Unknown)   SpO2 97%   BMI 30.52 kg/m  Wt Readings from Last 3 Encounters:  01/09/20 183 lb 6.4 oz (83.2 kg)  01/06/20 177 lb 12.8 oz (80.6 kg)  12/16/19 178 lb 3.2 oz (80.8 kg)     Health Maintenance Due  Topic Date Due  . FOOT EXAM  Never done  . OPHTHALMOLOGY EXAM  Never done  . DEXA SCAN  Never done    There are no preventive care reminders to display for this patient.  Lab Results  Component Value Date   TSH 11.769 (H) 11/11/2019   Lab Results  Component Value Date   WBC 5.4 08/19/2019   HGB 13.3 08/19/2019   HCT 40 08/19/2019   MCV 90.8 08/12/2019   PLT 209 08/19/2019   Lab Results  Component Value Date   NA 138 10/21/2019   K 4.0 10/21/2019   CO2 26 10/21/2019   GLUCOSE 124 (H) 10/21/2019   BUN 15 10/21/2019   CREATININE 0.80 10/21/2019     BILITOT 0.4 08/12/2019   ALKPHOS 80 08/19/2019   AST 16 08/19/2019   ALT 13 08/19/2019   PROT 7.3 08/12/2019   ALBUMIN 4.3 08/19/2019   CALCIUM 9.3 10/21/2019   ANIONGAP 10 10/21/2019   Lab Results  Component Value Date   CHOL 251 (H) 11/11/2019   Lab Results  Component Value Date   HDL 48 11/11/2019   Lab Results  Component Value Date   LDLCALC 180 (H) 11/11/2019   Lab Results  Component Value Date   TRIG 113 11/11/2019   Lab Results  Component Value Date   CHOLHDL 5.2 11/11/2019   Lab Results  Component Value Date   HGBA1C 5.7 08/19/2019      Assessment & Plan:     Problem List Items Addressed This Visit      Other   Pain of back and left lower extremity - Primary    Back pain with left side sciatica not well controlled on current medication.  Started patient on 5% lidocaine patch every 24 hours.  Completed referral to orthopedic for reevaluation.  Provided education with printed handouts given.  Advised patient to follow-up with worsening or unresolved symptoms.      Relevant Medications   lidocaine (LIDODERM) 5 %   Other Relevant Orders   Ambulatory referral to Orthopedic Surgery      Meds ordered this encounter  Medications  . lidocaine (LIDODERM) 5 %    Sig: Place 1 patch onto the skin daily. Remove & Discard patch within 12 hours or as directed by MD    Dispense:  30 patch    Refill:  0    Order Specific Question:   Supervising Provider    Answer:   Caryl Pina A [[id number]]  . DISCONTD: meloxicam (MOBIC) 7.5 MG tablet    Sig: Take 1 tablet (7.5 mg total) by mouth daily.    Dispense:  30 tablet    Refill:  0    Order Specific Question:   Supervising Provider    Answer:   Caryl Pina A [id number]  . diclofenac Sodium (VOLTAREN) 1 % GEL    Sig: Apply 2 g topically 4 (four) times daily.    Dispense:  50 g    Refill:  2    Order Specific Question:   Supervising Provider    Answer:   Caryl Pina A [[id number]]    Follow-up: Return if symptoms worsen or fail to improve.    Ivy Lynn, NP

## 2020-01-09 NOTE — Patient Instructions (Signed)
Acute Back Pain, Adult Acute back pain is sudden and usually short-lived. It is often caused by an injury to the muscles and tissues in the back. The injury may result from:  A muscle or ligament getting overstretched or torn (strained). Ligaments are tissues that connect bones to each other. Lifting something improperly can cause a back strain.  Wear and tear (degeneration) of the spinal disks. Spinal disks are circular tissue that provides cushioning between the bones of the spine (vertebrae).  Twisting motions, such as while playing sports or doing yard work.  A hit to the back.  Arthritis. You may have a physical exam, lab tests, and imaging tests to find the cause of your pain. Acute back pain usually goes away with rest and home care. Follow these instructions at home: Managing pain, stiffness, and swelling  Take over-the-counter and prescription medicines only as told by your health care provider.  Your health care provider may recommend applying ice during the first 24-48 hours after your pain starts. To do this: ? Put ice in a plastic bag. ? Place a towel between your skin and the bag. ? Leave the ice on for 20 minutes, 2-3 times a day.  If directed, apply heat to the affected area as often as told by your health care provider. Use the heat source that your health care provider recommends, such as a moist heat pack or a heating pad. ? Place a towel between your skin and the heat source. ? Leave the heat on for 20-30 minutes. ? Remove the heat if your skin turns bright red. This is especially important if you are unable to feel pain, heat, or cold. You have a greater risk of getting burned. Activity   Do not stay in bed. Staying in bed for more than 1-2 days can delay your recovery.  Sit up and stand up straight. Avoid leaning forward when you sit, or hunching over when you stand. ? If you work at a desk, sit close to it so you do not need to lean over. Keep your chin tucked  in. Keep your neck drawn back, and keep your elbows bent at a right angle. Your arms should look like the letter "L." ? Sit high and close to the steering wheel when you drive. Add lower back (lumbar) support to your car seat, if needed.  Take short walks on even surfaces as soon as you are able. Try to increase the length of time you walk each day.  Do not sit, drive, or stand in one place for more than 30 minutes at a time. Sitting or standing for long periods of time can put stress on your back.  Do not drive or use heavy machinery while taking prescription pain medicine.  Use proper lifting techniques. When you bend and lift, use positions that put less stress on your back: ? Bend your knees. ? Keep the load close to your body. ? Avoid twisting.  Exercise regularly as told by your health care provider. Exercising helps your back heal faster and helps prevent back injuries by keeping muscles strong and flexible.  Work with a physical therapist to make a safe exercise program, as recommended by your health care provider. Do any exercises as told by your physical therapist. Lifestyle  Maintain a healthy weight. Extra weight puts stress on your back and makes it difficult to have good posture.  Avoid activities or situations that make you feel anxious or stressed. Stress and anxiety increase muscle   tension and can make back pain worse. Learn ways to manage anxiety and stress, such as through exercise. General instructions  Sleep on a firm mattress in a comfortable position. Try lying on your side with your knees slightly bent. If you lie on your back, put a pillow under your knees.  Follow your treatment plan as told by your health care provider. This may include: ? Cognitive or behavioral therapy. ? Acupuncture or massage therapy. ? Meditation or yoga. Contact a health care provider if:  You have pain that is not relieved with rest or medicine.  You have increasing pain going down  into your legs or buttocks.  Your pain does not improve after 2 weeks.  You have pain at night.  You lose weight without trying.  You have a fever or chills. Get help right away if:  You develop new bowel or bladder control problems.  You have unusual weakness or numbness in your arms or legs.  You develop nausea or vomiting.  You develop abdominal pain.  You feel faint. Summary  Acute back pain is sudden and usually short-lived.  Use proper lifting techniques. When you bend and lift, use positions that put less stress on your back.  Take over-the-counter and prescription medicines and apply heat or ice as directed by your health care provider. This information is not intended to replace advice given to you by your health care provider. Make sure you discuss any questions you have with your health care provider. Document Revised: 05/28/2018 Document Reviewed: 09/20/2016 Elsevier Patient Education  2020 Elsevier Inc.  

## 2020-01-12 ENCOUNTER — Telehealth: Payer: Self-pay | Admitting: *Deleted

## 2020-01-12 NOTE — Telephone Encounter (Signed)
I told patient Insurance approval could be a problem for the pain patch, but I wanted to try. if they cant pay out of pocket, there is not much I can do . Please ask patient to continue,  pain management regimen as before and follow up with orthopedic as discussed in clinic. Thank you

## 2020-01-12 NOTE — Telephone Encounter (Signed)
Prior Auth for LIDOCAINE 5% PATCHES- IN PROCESS  Key: [id number]  [employer] is reviewing your PA request. Typically an electronic response will be received within 72 hours. To check for an update later, open this request from your dashboard.  You may close this dialog and return to your dashboard to perform other tasks.

## 2020-01-12 NOTE — Telephone Encounter (Signed)
Prior Auth for LIDOCAINE 5% PATCHES- Denied  Key: [id number]  Not covered by Part D

## 2020-01-13 NOTE — Telephone Encounter (Signed)
Patient aware and verbalized understanding. °

## 2020-01-21 ENCOUNTER — Ambulatory Visit: Payer: Self-pay

## 2020-01-21 ENCOUNTER — Encounter: Payer: Self-pay | Admitting: Orthopaedic Surgery

## 2020-01-21 ENCOUNTER — Ambulatory Visit (INDEPENDENT_AMBULATORY_CARE_PROVIDER_SITE_OTHER): Payer: Medicare Other | Admitting: Orthopaedic Surgery

## 2020-01-21 ENCOUNTER — Other Ambulatory Visit: Payer: Self-pay

## 2020-01-21 VITALS — Ht 61.0 in | Wt 183.0 lb

## 2020-01-21 DIAGNOSIS — M549 Dorsalgia, unspecified: Secondary | ICD-10-CM

## 2020-01-21 DIAGNOSIS — M545 Low back pain, unspecified: Secondary | ICD-10-CM | POA: Diagnosis not present

## 2020-01-21 DIAGNOSIS — G8929 Other chronic pain: Secondary | ICD-10-CM | POA: Diagnosis not present

## 2020-01-21 DIAGNOSIS — M79605 Pain in left leg: Secondary | ICD-10-CM | POA: Diagnosis not present

## 2020-01-21 NOTE — Progress Notes (Signed)
Office Visit Note   Patient: [name]           Date of Birth: 07/06/1940           MRN: [id number] Visit Date: 01/21/2020              Requested by: Ivy Lynn, NP [address] [place],  [place] [address] PCP: Ivy Lynn, NP   Assessment & Plan: Visit Diagnoses:  1. Chronic bilateral low back pain, unspecified whether sciatica present   2. Pain of back and left lower extremity     Plan: [name] covered by a family member and here for evaluation of chronic low back and bilateral lower extremity pain.  She has significant degenerative changes throughout the lumbar spine and I suspect she may have spinal stenosis.  After much discussion I will order an MRI scan of her lumbar spine and have her return.  We have talked about potential epidural steroid injections and even physical therapy pending  the results  Follow-Up Instructions: No follow-ups on file.   Orders:  Orders Placed This Encounter  Procedures  . XR Lumbar Spine 2-3 Views   No orders of the defined types were placed in this encounter.     Procedures: No procedures performed   Clinical Data: No additional findings.   Subjective: Chief Complaint  Patient presents with  . Lower Back - Pain  Patient presents today for her lower back pain. She said that she has had lower back pain for many years. She use to see another doctor in [place], but states that the previous doctor wanted to do surgery and she would not allow that. Her pain is worsening over time. She has pain that radiates down both legs. She constantly has pain, but it gets worse with prolonged standing. Some numbness and tingling in both feet. No weakness that she is aware of. She feels better when she applies a icyhot patch or takes her hydrocodone.  Has history of diabetes and probable diabetic neuropathy.  She does have chronic back pain and discomfort in both lower extremities with feeling of heaviness and achiness the longer  she stands of the further she walks.  HPI  Review of Systems   Objective: Vital Signs: Ht 5\' 1"  (1.549 m)   Wt 183 lb (83 kg)   LMP  (LMP Unknown)   BMI 34.58 kg/m   Physical Exam Constitutional:      Appearance: She is well-developed.  Eyes:     Pupils: Pupils are equal, round, and reactive to light.  Pulmonary:     Effort: Pulmonary effort is normal.  Skin:    General: Skin is warm and dry.  Neurological:     Mental Status: She is alert and oriented to person, place, and time.  Psychiatric:        Behavior: Behavior normal.     Ortho Exam awake alert and oriented x3.  Comfortable sitting.  Straight leg raise is negative bilaterally.  Painless range of motion of both hips with internal and external rotation.  No obvious atrophy of either lower extremity.  Toes were just a little cool but good capillary refill.  Did not feel very good pulses but there was no edema.  She does have some numbness and tingling probably consistent with diabetic neuropathy.  No percussible tenderness lumbar spine or either sacroiliac joint.  Specialty Comments:  No specialty comments available.  Imaging: XR Lumbar Spine 2-3 Views  Result Date:  01/21/2020 Films of the lumbar spine were obtained in 2 projections.  There is significant degenerative change throughout the entire lumbar spine.  There is an anterior listhesis grade 1-2 of L4 on 5.  Diffuse calcification of the abdominal aorta without obvious aneurysmal dilatation.  Facet joint degenerative changes predominating at L3-4 L4-5 and L5-S1 with anterior osteophytes along the vertebral bodies and narrowing of the disc spaces at L1-L2 3 L3-4 and L4-5.  There is a small left degenerative lumbar scoliosis no evidence of a compression fracture.  An AP of the pelvis also obtained revealing some mild degenerative changes of both hips at the sacroiliac joints.  Clinically the patient did not have any hip pain    PMFS History: Patient Active Problem  List   Diagnosis Date Noted  . Paroxysmal atrial fibrillation ([place]) 12/16/2019  . Secondary hypercoagulable state ([place]) 12/16/2019  . 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 Annual physical exam 10/02/2019  . Anxiety with depression 10/02/2019  . Numbness 04/02/2018  . Numbness of tongue 04/02/2018  . Neck pain on right side 04/02/2018  . Type 2 diabetes mellitus without complication ([place]) 76/54/6503  . Atrial fibrillation with rapid ventricular response ([place])   . Chest pain in adult   . Atrial fibrillation with RVR ([place]) 12/17/2017  . Accelerating angina ([place])   . Exertional angina ([facility]) 02/23/2015  . Chest pain 11/01/2012  . Mixed hyperlipidemia 11/12/2009  . Essential hypertension 11/12/2009  . Coronary atherosclerosis of native coronary artery 09/25/2008  . Pain of back and left lower extremity 02/19/2008   Past Medical History:  Diagnosis Date  . Anxiety   . Arthritis   . Bulging lumbar disc   . Chronic lower back pain   . Coronary atherosclerosis of native coronary artery    DES LAD 02/2008, DES LAD 02/2015  . Depression   . Dyslipidemia   . Essential hypertension   . Facial numbness   . Frequent headaches   . GERD (gastroesophageal reflux disease)   . Glaucoma   . Insomnia   . Paroxysmal atrial fibrillation [facility])    Diagnosed October 2019  . PSVT (paroxysmal supraventricular tachycardia) ([place])   . Refusal of blood transfusions as patient is Jehovah's Witness   . Type 2 diabetes mellitus ([facility])    Borderline  . Vitamin D deficiency     Family History  Problem Relation Age of Onset  . Other Mother        "natural causes"  . Heart disease Mother   . Cancer Father        unsure of type    Past Surgical History:  Procedure Laterality Date  . CARDIAC CATHETERIZATION N/A 02/23/2015   Procedure: Left Heart Cath and Coronary Angiography;  Surgeon: Sherren Mocha, MD;  Location: [place] [facility];  Service: Cardiovascular;  Laterality: N/A;  . CARDIAC CATHETERIZATION  N/A 02/23/2015   Procedure: Coronary Stent Intervention;  Surgeon: Sherren Mocha, MD;  Location: [place] [facility];  Service: Cardiovascular;  Laterality: N/A;  . CARDIAC CATHETERIZATION N/A 01/04/2016   Procedure: Left Heart Cath and Coronary Angiography;  Surgeon: Roldan Laforest M Martinique, MD;  Location[facility];  Service: Cardiovascular;  Laterality: N/A;  . CATARACT EXTRACTION    . CORONARY ANGIOPLASTY  02/23/2015  . CORONARY ANGIOPLASTY WITH STENT PLACEMENT  2009  . DILATION AND CURETTAGE OF UTERUS    . TUBAL LIGATION     Social History   Occupational History  . Occupation: RETIRED  Tobacco Use  .  Smoking status: Never Smoker  . Smokeless tobacco: Never Used  Vaping Use  . Vaping Use: Never used  Substance and Sexual Activity  . Alcohol use: Yes    Alcohol/week: 1.0 standard drink    Types: 1 Glasses of wine per week    Comment: RARE OCCASION  . Drug use: No  . Sexual activity: Never

## 2020-01-29 ENCOUNTER — Other Ambulatory Visit: Payer: Self-pay | Admitting: *Deleted

## 2020-01-29 DIAGNOSIS — Z79899 Other long term (current) drug therapy: Secondary | ICD-10-CM

## 2020-01-29 DIAGNOSIS — I480 Paroxysmal atrial fibrillation: Secondary | ICD-10-CM

## 2020-01-29 NOTE — Progress Notes (Signed)
Will have done TSH at [facility] on 02/02/20

## 2020-01-30 ENCOUNTER — Ambulatory Visit: Payer: Medicare Other

## 2020-01-30 DIAGNOSIS — I480 Paroxysmal atrial fibrillation: Secondary | ICD-10-CM | POA: Diagnosis not present

## 2020-01-30 DIAGNOSIS — Z79899 Other long term (current) drug therapy: Secondary | ICD-10-CM | POA: Diagnosis not present

## 2020-01-31 LAB — TSH: TSH: 16.4 u[IU]/mL — ABNORMAL HIGH (ref 0.450–4.500)

## 2020-02-02 ENCOUNTER — Encounter: Payer: Self-pay | Admitting: Internal Medicine

## 2020-02-02 ENCOUNTER — Telehealth (INDEPENDENT_AMBULATORY_CARE_PROVIDER_SITE_OTHER): Payer: Medicare Other | Admitting: Internal Medicine

## 2020-02-02 ENCOUNTER — Other Ambulatory Visit: Payer: Self-pay | Admitting: Nurse Practitioner

## 2020-02-02 ENCOUNTER — Other Ambulatory Visit: Payer: Self-pay

## 2020-02-02 ENCOUNTER — Telehealth: Payer: Self-pay | Admitting: *Deleted

## 2020-02-02 VITALS — Ht 61.0 in | Wt 173.0 lb

## 2020-02-02 DIAGNOSIS — R0602 Shortness of breath: Secondary | ICD-10-CM

## 2020-02-02 DIAGNOSIS — I4819 Other persistent atrial fibrillation: Secondary | ICD-10-CM

## 2020-02-02 DIAGNOSIS — R7989 Other specified abnormal findings of blood chemistry: Secondary | ICD-10-CM

## 2020-02-02 DIAGNOSIS — D6869 Other thrombophilia: Secondary | ICD-10-CM

## 2020-02-02 NOTE — Progress Notes (Signed)
Electrophysiology TeleHealth Note   Due to national recommendations of social distancing due to [place] 19, Audio/video telehealth visit is felt to be most appropriate for this patient at this time.  See MyChart message from today for patient consent regarding telehealth for [facility].   Date:  02/02/2020   ID:  [name], DOB January 19, 1941, MRN [id number]  Location: home Provider location: [place] [facility] Evaluation Performed: New patient consult  PCP:  Ivy Lynn, NP  Cardiologist:  Rozann Lesches, MD Electrophysiologist:  None   Chief Complaint: afib  History of Present Illness:    [name] is a 79 y.o. female who presents via audio/video conferencing for a telehealth visit today.   The patient is referred for new consultation regarding afib by Dr Rayann Heman.  She was initially diagnosed with afib in 2019.  She required cardioversion.  She has done well since that time.  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 She has done reasonably well off of Amiodarone.  She has SOB with moderate activity.  She also has occasional angina. Today, she denies symptoms of palpitations, orthopnea, PND, lower extremity edema, claudication, dizziness, presyncope, syncope, bleeding, or neurologic sequela. Her primary concern is with hip pain.  She has plans to evaluate her hip further. The patient is tolerating medications without difficulties and is otherwise without complaint today.     Past Medical History:  Diagnosis Date  . Anxiety   . Arthritis   . Bulging lumbar disc   . Chronic lower back pain   . Coronary atherosclerosis of native coronary artery    DES LAD 02/2008, DES LAD 02/2015  . Depression   . Dyslipidemia   . Essential hypertension   . Facial numbness   . Frequent headaches   . GERD (gastroesophageal reflux disease)   . Glaucoma   . Insomnia   . Paroxysmal atrial fibrillation [facility])    Diagnosed October 2019  . PSVT  (paroxysmal supraventricular tachycardia) ([place])   . Refusal of blood transfusions as patient is Jehovah's Witness   . Type 2 diabetes mellitus (HCC)    Borderline  . Vitamin D deficiency     Past Surgical History:  Procedure Laterality Date  . CARDIAC CATHETERIZATION N/A 02/23/2015   Procedure: Left Heart Cath and Coronary Angiography;  Surgeon: Sherren Mocha, MD;  Location: [place] [facility];  Service: Cardiovascular;  Laterality: N/A;  . CARDIAC CATHETERIZATION N/A 02/23/2015   Procedure: Coronary Stent Intervention;  Surgeon: Sherren Mocha, MD;  Location: [place] [facility];  Service: Cardiovascular;  Laterality: N/A;  . CARDIAC CATHETERIZATION N/A 01/04/2016   Procedure: Left Heart Cath and Coronary Angiography;  Surgeon: Peter M Martinique, MD;  Location: [place] [facility];  Service: Cardiovascular;  Laterality: N/A;  . CATARACT EXTRACTION    . CORONARY ANGIOPLASTY  02/23/2015  . CORONARY ANGIOPLASTY WITH STENT PLACEMENT  2009  . DILATION AND CURETTAGE OF UTERUS    . TUBAL LIGATION      Current Outpatient Medications  Medication Sig Dispense Refill  . acetaminophen (TYLENOL) 650 MG CR tablet Take 1,300 mg by mouth every 8 (eight) hours as needed for pain.    Marland Kitchen amitriptyline (ELAVIL) 10 MG tablet Take 1 tablet (10 mg total) by mouth at bedtime. 60 tablet 2  . apixaban (ELIQUIS) 5 MG TABS tablet Take 1 tablet (5 mg total) by mouth 2 (two) times daily. 60 tablet 11  . Ascorbic Acid (VITAMIN C) 500  MG tablet Take 500 mg by mouth daily.      . bimatoprost (LUMIGAN) 0.01 % SOLN Place 1 drop into both eyes at bedtime.    . blood glucose meter kit and supplies Dispense based on patient and insurance preference. Use up to four times daily as directed. (FOR ICD-10 E10.9, E11.9). 1 each 0  . Calcium Carbonate Antacid (TUMS PO) Take 2 tablets by mouth 4 (four) times daily as needed (for acid reflux/indigestion).     . Cholecalciferol (VITAMIN D) 400 UNITS capsule Take 400 Units by mouth daily.       . Cyanocobalamin (VITAMIN B 12 PO) Take 1 tablet by mouth daily.    . diclofenac Sodium (VOLTAREN) 1 % GEL Apply 2 g topically 4 (four) times daily. 50 g 2  . diltiazem (CARDIZEM CD) 180 MG 24 hr capsule TAKE ONE (1) CAPSULE EACH DAY 90 capsule 3  . Garlic 035 MG TABS Take 1 tablet by mouth daily.    Marland Kitchen HYDROcodone-acetaminophen (NORCO) 10-325 MG tablet Take 1 tablet by mouth 4 (four) times daily as needed. For pain.    Marland Kitchen ibuprofen (ADVIL,MOTRIN) 200 MG tablet Take 600 mg by mouth every 6 (six) hours as needed (for pain.).     Marland Kitchen losartan (COZAAR) 25 MG tablet TAKE ONE (1) TABLET EACH DAY 90 tablet 1  . MAGNESIUM CARBONATE PO Take 1 tablet by mouth daily. 400 mg    . Menthol, Topical Analgesic, 4 % GEL Apply 1 application topically 4 (four) times daily as needed (for pain).     . metFORMIN (GLUCOPHAGE-XR) 500 MG 24 hr tablet Take 1 tablet by mouth daily.     . metoprolol tartrate (LOPRESSOR) 100 MG tablet Take 1 tablet (100 mg total) by mouth 2 (two) times daily. 180 tablet 3  . nitroGLYCERIN (NITROSTAT) 0.4 MG SL tablet Place 0.4 mg under the tongue every 5 (five) minutes as needed for chest pain.    . pantoprazole (PROTONIX) 40 MG tablet TAKE ONE (1) TABLET EACH DAY 90 tablet 2  . promethazine (PHENERGAN) 25 MG tablet 1 tablet as needed.    . rosuvastatin (CRESTOR) 5 MG tablet Take 1 tablet (5 mg total) by mouth daily. 90 tablet 1  . timolol (TIMOPTIC) 0.5 % ophthalmic solution Place 1 drop into both eyes daily.    . traZODone (DESYREL) 100 MG tablet Take 1 tablet by mouth at bedtime.     . TURMERIC PO Take 1 tablet by mouth daily.    . VENTOLIN HFA 108 (90 Base) MCG/ACT inhaler Inhale 1-2 puffs into the lungs every 4 (four) hours as needed for shortness of breath.     No current facility-administered medications for this visit.    Allergies:   Patient has no known allergies.   Social History:  The patient  reports that she has never smoked. She has never used smokeless tobacco. She  reports current alcohol use of about 1.0 standard drink of alcohol per week. She reports that she does not use drugs.   Family History:  The patient's family history includes Cancer in her father; Heart disease in her mother; Other in her mother.    ROS:  Please see the history of present illness.   All other systems are personally reviewed and negative.    Exam:    Vital Signs:  Ht _0  (1.549 m)   Wt 173 lb (78.5 kg)   LMP  (LMP Unknown)   BMI 32.69 kg/m   Well sounding,  alert and conversant  Labs/Other Tests and Data Reviewed:    Recent Labs: 08/19/2019: ALT 13; Hemoglobin 13.3; Platelets 209 10/21/2019: BUN 15; Creatinine, Ser 0.80; Potassium 4.0; Sodium 138 01/30/2020: TSH 16.400   Wt Readings from Last 3 Encounters:  02/02/20 173 lb (78.5 kg)  01/21/20 183 lb (83 kg)  01/09/20 183 lb 6.4 oz (83.2 kg)     Other studies personally reviewed: Additional studies/ records that were reviewed today include: recent event monitor, echo, prior cath,   Review of the above records today demonstrates: as above   ASSESSMENT & PLAN:    1.  Persistent afib The patient has symptomatic, recurrent atrial fibrillation. she has failed medical therapy with amiodarone Chads2vasc score is 6.  she is anticoagulated with eliquis . Therapeutic strategies for afib including medicine and ablation were discussed in detail with the patient today. Her primary concern is with SOB.  She is not active.  Her recent event monitor did not show any afib.  She had very rare nonsustained atach. I would advise that we further characterize her afib before we adjust medicines or consider ablation.  I would therefore advise long term monitoring with an implanted loop recorder.  Risks and benefits to ILR were discussed with patient and her daughter who wishes to proceed. We will arrange at the next available time.  2. SOB multifactorial I am not convinced that this is due to afib as this is clearly not  temporarily related to her afib. I will defer to Dr Domenic Polite.  Regular exercise is advised  3. HTN Stable No change required today  4. CAD May need further risk assessment given #2 above.  I will defer to primary cardiologist   Patient Risk:  after full review of this patients clinical status, I feel that they are at moderate risk at this time.   Today, I have spent 22 minutes with the patient with telehealth technology discussing afib .    SignedThompson Grayer MD, [facility] 02/02/2020 1:04 PM   [facility] [address] [place] [address] [phone] (office) [phone] (fax)

## 2020-02-02 NOTE — Telephone Encounter (Signed)
-----   Message from Satira Sark, MD sent at 02/01/2020 10:01 AM EST ----- Results reviewed.  TSH has increased further to 16.4, up from 11.7.  We have already reduced her amiodarone to 100 mg daily, go ahead and stop it for now.  Please get her referred to endocrinology, she can be seen in [place].

## 2020-02-02 NOTE — Telephone Encounter (Signed)
Patient informed and confirmed that she has been off amiodarone since September 2021. Verbalized understanding of plan.

## 2020-02-03 ENCOUNTER — Other Ambulatory Visit (HOSPITAL_COMMUNITY)
Admission: RE | Admit: 2020-02-03 | Discharge: 2020-02-03 | Disposition: A | Discharge status: Home / Self Care | Payer: Medicare Other | Source: Ambulatory Visit | Attending: "Endocrinology | Admitting: "Endocrinology

## 2020-02-03 ENCOUNTER — Other Ambulatory Visit: Payer: Self-pay

## 2020-02-03 ENCOUNTER — Ambulatory Visit (INDEPENDENT_AMBULATORY_CARE_PROVIDER_SITE_OTHER): Payer: Medicare Other | Admitting: "Endocrinology

## 2020-02-03 ENCOUNTER — Encounter: Payer: Self-pay | Admitting: "Endocrinology

## 2020-02-03 VITALS — BP 140/58 | HR 52 | Ht 61.0 in | Wt 178.0 lb

## 2020-02-03 DIAGNOSIS — E039 Hypothyroidism, unspecified: Secondary | ICD-10-CM | POA: Diagnosis not present

## 2020-02-03 LAB — TSH: TSH: 20.523 u[IU]/mL — ABNORMAL HIGH (ref 0.350–4.500)

## 2020-02-03 LAB — T4, FREE: Free T4: 0.44 ng/dL — ABNORMAL LOW (ref 0.61–1.12)

## 2020-02-03 NOTE — Progress Notes (Signed)
Endocrinology Consult Note                                            02/03/2020, 5:18 PM   Subjective:    Patient ID: [name], female    DOB: 1941/02/20, PCP Ivy Lynn, NP   Past Medical History:  Diagnosis Date  . Anxiety   . Arthritis   . Bulging lumbar disc   . Chronic lower back pain   . Coronary atherosclerosis of native coronary artery    DES LAD 02/2008, DES LAD 02/2015  . Depression   . Dyslipidemia   . Essential hypertension   . Facial numbness   . Frequent headaches   . GERD (gastroesophageal reflux disease)   . Glaucoma   . Insomnia   . Paroxysmal atrial fibrillation [facility])    Diagnosed October 2019  . PSVT (paroxysmal supraventricular tachycardia) ([place] )   . Refusal of blood transfusions as patient is Jehovah's Witness   . Type 2 diabetes mellitus (HCC)    Borderline  . Vitamin D deficiency    Past Surgical History:  Procedure Laterality Date  . CARDIAC CATHETERIZATION N/A 02/23/2015   Procedure: Left Heart Cath and Coronary Angiography;  Surgeon: Sherren Mocha, MD;  Location: [place] [facility];  Service: Cardiovascular;  Laterality: N/A;  . CARDIAC CATHETERIZATION N/A 02/23/2015   Procedure: Coronary Stent Intervention;  Surgeon: Sherren Mocha, MD;  Location: [place] [facility];  Service: Cardiovascular;  Laterality: N/A;  . CARDIAC CATHETERIZATION N/A 01/04/2016   Procedure: Left Heart Cath and Coronary Angiography;  Surgeon: Peter M Martinique, MD;  Location: [place] [facility];  Service: Cardiovascular;  Laterality: N/A;  . CATARACT EXTRACTION    . CORONARY ANGIOPLASTY  02/23/2015  . CORONARY ANGIOPLASTY WITH STENT PLACEMENT  2009  . DILATION AND CURETTAGE OF UTERUS    . TUBAL LIGATION     Social History   Socioeconomic History  . Marital status: Widowed    Spouse name: Not on file  . Number of children: 8  . Years of education: 10th grade  . Highest education level: Not on file  Occupational History  . Occupation: RETIRED   Tobacco Use  . Smoking status: Never Smoker  . Smokeless tobacco: Never Used  Vaping Use  . Vaping Use: Never used  Substance and Sexual Activity  . Alcohol use: Yes    Alcohol/week: 1.0 standard drink    Types: 1 Glasses of wine per week    Comment: RARE OCCASION  . Drug use: No  . Sexual activity: Never  Other Topics Concern  . Not on file  Social History Narrative   Lives at home alone.   Right-handed.   No caffeine use.   Social Determinants of Health   Financial Resource Strain: Not on file  Food Insecurity: Not on file  Transportation Needs: Not on file  Physical Activity: Not on file  Stress: Not on file  Social Connections: Not on file   Family History  Problem Relation Age of Onset  . Other Mother        "natural causes"  . Heart disease Mother   . Cancer Father        unsure of type   Outpatient Encounter Medications as of 02/03/2020  Medication Sig  . acetaminophen (TYLENOL) 650 MG CR tablet Take 1,300 mg  by mouth every 8 (eight) hours as needed for pain.  Marland Kitchen amitriptyline (ELAVIL) 10 MG tablet Take 1 tablet (10 mg total) by mouth at bedtime.  Marland Kitchen apixaban (ELIQUIS) 5 MG TABS tablet Take 1 tablet (5 mg total) by mouth 2 (two) times daily.  . Ascorbic Acid (VITAMIN C) 500 MG tablet Take 500 mg by mouth daily.    . bimatoprost (LUMIGAN) 0.01 % SOLN Place 1 drop into both eyes at bedtime.  . blood glucose meter kit and supplies Dispense based on patient and insurance preference. Use up to four times daily as directed. (FOR ICD-10 E10.9, E11.9).  . Calcium Carbonate Antacid (TUMS PO) Take 2 tablets by mouth 4 (four) times daily as needed (for acid reflux/indigestion).   . Cholecalciferol (VITAMIN D) 400 UNITS capsule Take 400 Units by mouth daily.    . Cyanocobalamin (VITAMIN B 12 PO) Take 1 tablet by mouth daily.  . diclofenac Sodium (VOLTAREN) 1 % GEL Apply 2 g topically 4 (four) times daily. (Patient taking differently: Apply 2 g topically 4 (four) times daily.  As needed)  . diltiazem (CARDIZEM CD) 180 MG 24 hr capsule TAKE ONE (1) CAPSULE EACH DAY  . Garlic 100 MG TABS Take 1 tablet by mouth daily.  Marland Kitchen HYDROcodone-acetaminophen (NORCO) 10-325 MG tablet Take 1 tablet by mouth 4 (four) times daily as needed. For pain.  Marland Kitchen ibuprofen (ADVIL,MOTRIN) 200 MG tablet Take 600 mg by mouth every 6 (six) hours as needed (for pain.).   Marland Kitchen losartan (COZAAR) 25 MG tablet TAKE ONE (1) TABLET EACH DAY  . MAGNESIUM CARBONATE PO Take 1 tablet by mouth daily. 400 mg  . Menthol, Topical Analgesic, 4 % GEL Apply 1 application topically 4 (four) times daily as needed (for pain).   . metFORMIN (GLUCOPHAGE-XR) 500 MG 24 hr tablet Take 1 tablet by mouth daily.   . metoprolol tartrate (LOPRESSOR) 100 MG tablet Take 1 tablet (100 mg total) by mouth 2 (two) times daily.  . nitroGLYCERIN (NITROSTAT) 0.4 MG SL tablet Place 0.4 mg under the tongue every 5 (five) minutes as needed for chest pain.  . pantoprazole (PROTONIX) 40 MG tablet TAKE ONE (1) TABLET EACH DAY (Patient not taking: Reported on 02/03/2020)  . promethazine (PHENERGAN) 25 MG tablet 1 tablet as needed.  . rosuvastatin (CRESTOR) 5 MG tablet Take 1 tablet (5 mg total) by mouth daily.  . timolol (TIMOPTIC) 0.5 % ophthalmic solution Place 1 drop into both eyes daily.  . traZODone (DESYREL) 100 MG tablet Take 1 tablet by mouth at bedtime.   . TURMERIC PO Take 1 tablet by mouth daily.  . VENTOLIN HFA 108 (90 Base) MCG/ACT inhaler Inhale 1-2 puffs into the lungs every 4 (four) hours as needed for shortness of breath.   No facility-administered encounter medications on file as of 02/03/2020.   ALLERGIES: No Known Allergies  VACCINATION STATUS: Immunization History  Administered Date(s) Administered  . Fluad Quad(high Dose 65+) 11/20/2019  . Influenza, High Dose Seasonal PF 12/25/2016, 01/07/2018  . Influenza-Unspecified 01/21/2015  . Moderna SARS-COV2 Booster Vaccination 12/31/2019  . Moderna Sars-Covid-2 Vaccination  04/01/2019, 04/29/2019  . Tdap 12/24/2018    HPI [name] is 79 y.o. female who presents today with a medical history as above. she is being seen in consultation for hypothyroidism requested by Daryll Drown, NP.  Patient is accompanied by her daughter to clinic.  She denies any prior history of thyroid dysfunction.  She was found to have elevated TSH on 3  separate occasions since July 2021.  Her most recent TSH was 16.4 prior to this visit.  She did not have any measurement of T4 nor T3. Of note, she was treated with amiodarone for almost a year due to cardiac dysrhythmia .  Her last exposure was 3 months ago.  Patient denies any family history of thyroid dysfunction.  She is not currently on antithyroid intervention. She reports weight gain, fatigue, cold intolerance, constipation.  She is not on amiodarone any longer.  Review of Systems  Constitutional: + recent weight gain, +fatigue, +subjective hypothermia Eyes: no blurry vision, no xerophthalmia ENT: no sore throat, no nodules palpated in throat, no dysphagia/odynophagia, no hoarseness Cardiovascular: [name], no Shortness of Breath, no palpitations, no leg swelling Respiratory: no cough, no shortness of breath Gastrointestinal: no Nausea/Vomiting/Diarhhea Musculoskeletal: no muscle/joint aches Skin: no rashes Neurological: no tremors, no numbness, no tingling, no dizziness Psychiatric: no depression, no anxiety  Objective:    Vitals with BMI 02/03/2020 02/02/2020 01/21/2020  Height _0  _1  _2   Weight 178 lbs 173 lbs 183 lbs  BMI 33.65 15.7 26.2  Systolic 035 - -  Diastolic 58 - -  Pulse 52 - -    BP (!) 140/58   Pulse (!) 52   Ht _3  (1.549 m)   Wt 178 lb (80.7 kg)   LMP  (LMP Unknown)   BMI 33.63 kg/m   Wt Readings from Last 3 Encounters:  02/03/20 178 lb (80.7 kg)  02/02/20 173 lb (78.5 kg)  01/21/20 183 lb (83 kg)    Physical Exam  Constitutional:  Body mass index is 33.63 kg/m.,   not in acute distress, normal state of mind Eyes: PERRLA, EOMI, no exophthalmos ENT: moist mucous membranes, + gross thyromegaly, no gross cervical lymphadenopathy Cardiovascular: normal precordial activity, Regular Rate and Rhythm, no Murmur/Rubs/Gallops Respiratory:  adequate breathing efforts, no gross chest deformity, Clear to auscultation bilaterally Gastrointestinal: abdomen soft, Non -tender, No distension, Bowel Sounds present, no gross organomegaly Musculoskeletal: no gross deformities, strength intact in all four extremities Skin: moist, warm, no rashes Neurological: no tremor with outstretched hands, Deep tendon reflexes normal in bilateral lower extremities.  CMP ( most recent) CMP     Component Value Date/Time   NA 138 10/21/2019 1347   NA 136 (A) 08/19/2019 0000   K 4.0 10/21/2019 1347   CL 102 10/21/2019 1347   CO2 26 10/21/2019 1347   GLUCOSE 124 (H) 10/21/2019 1347   BUN 15 10/21/2019 1347   BUN 12 08/19/2019 0000   CREATININE 0.80 10/21/2019 1347   CALCIUM 9.3 10/21/2019 1347   PROT 7.3 08/12/2019 1547   PROT 6.7 04/02/2018 1118   ALBUMIN 4.3 08/19/2019 0000   ALBUMIN 4.3 04/02/2018 1118   AST 16 08/19/2019 0000   ALT 13 08/19/2019 0000   ALKPHOS 80 08/19/2019 0000   BILITOT 0.4 08/12/2019 1547   BILITOT 0.3 04/02/2018 1118   GFRNONAA >60 10/21/2019 1347   GFRAA >60 10/21/2019 1347     Diabetic Labs (most recent): Lab Results  Component Value Date   HGBA1C 5.7 08/19/2019   HGBA1C 7.5 (H) 12/17/2017   HGBA1C  02/25/2008    6.1 (NOTE)   The ADA recommends the following therapeutic goal for glycemic   control related to Hgb A1C measurement:   Goal of Therapy:   < 7.0% Hgb A1C   Reference: American Diabetes Association: Clinical Practice   Recommendations 2008, Diabetes Care,  2008, 31:(Suppl 1).  Lipid Panel ( most recent) Lipid Panel     Component Value Date/Time   CHOL 251 (H) 11/11/2019 1124   TRIG 113 11/11/2019 1124   HDL 48 11/11/2019  1124   CHOLHDL 5.2 11/11/2019 1124   VLDL 23 11/11/2019 1124   LDLCALC 180 (H) 11/11/2019 1124      Lab Results  Component Value Date   TSH 20.523 (H) 02/03/2020   TSH 16.400 (H) 01/30/2020   TSH 11.769 (H) 11/11/2019   TSH 5.290 (H) 08/12/2019   TSH 1.134 11/13/2018   TSH 1.422 12/17/2017   TSH 0.564 12/01/2016       Assessment & Plan:   1. Hypothyroidism, unspecified type  - ARLISSA MONTEVERDE  is being seen at a kind request of Ivy Lynn, NP. - I have reviewed her available thyroid records and clinically evaluated the patient. 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  - she will need a repeat,  more complete thyroid function tests towards confirming the diagnosis.  -she will be sent to lab for TSH, free T4/free T3, and antithyroid antibodies and patient will be on phone visit in a week to discuss her results and treatment decisions if necessary.   -She could be treated with amiodarone if necessary. -She will be considered for thyroid ultrasound given her clinical goiter.  - I did not initiate any new prescriptions today. - she is advised to maintain close follow up with Ivy Lynn, NP for primary care needs.   - Time spent with the patient: 45 minutes, of which >50% was spent in  counseling her about her amiodarone induced hypothyroidism and the rest in obtaining information about her symptoms, reviewing her previous labs/studies ( including abstractions from other facilities),  evaluations, and treatments,  and developing a plan to confirm diagnosis and long term treatment based on the latest standards of care/guidelines; and documenting her care.  [name] participated in the discussions, expressed understanding, and voiced agreement with the above plans.  All questions were answered to her satisfaction. she is encouraged to contact  clinic should she have any questions or concerns prior to her return visit.  Follow up plan: Return in about 3 days (around 02/06/2020), or Phone visit.   Glade Lloyd, MD [facility] [address] [place], [place] [address] Phone: [phone]  Fax: [phone]     02/03/2020, 5:18 PM  This note was partially dictated with voice recognition software. Similar sounding words can be transcribed inadequately or may not  be corrected upon review.

## 2020-02-04 LAB — T3, FREE: T3, Free: 1.9 pg/mL — ABNORMAL LOW (ref 2.0–4.4)

## 2020-02-04 LAB — THYROID PEROXIDASE ANTIBODY: Thyroperoxidase Ab SerPl-aCnc: <8 IU/mL (ref 0–34)

## 2020-02-04 LAB — THYROGLOBULIN ANTIBODY: Thyroglobulin Antibody: <1 IU/mL (ref 0.0–0.9)

## 2020-02-05 ENCOUNTER — Other Ambulatory Visit: Payer: Self-pay

## 2020-02-05 ENCOUNTER — Ambulatory Visit (HOSPITAL_COMMUNITY)
Admission: RE | Admit: 2020-02-05 | Discharge: 2020-02-05 | Disposition: A | Discharge status: Home / Self Care | Payer: Medicare Other | Source: Ambulatory Visit | Attending: Orthopaedic Surgery | Admitting: Orthopaedic Surgery

## 2020-02-05 DIAGNOSIS — M79605 Pain in left leg: Secondary | ICD-10-CM | POA: Insufficient documentation

## 2020-02-05 DIAGNOSIS — M545 Low back pain, unspecified: Secondary | ICD-10-CM | POA: Diagnosis not present

## 2020-02-05 DIAGNOSIS — M549 Dorsalgia, unspecified: Secondary | ICD-10-CM | POA: Diagnosis not present

## 2020-02-06 ENCOUNTER — Other Ambulatory Visit: Payer: Self-pay | Admitting: Cardiology

## 2020-02-06 ENCOUNTER — Encounter: Payer: Self-pay | Admitting: "Endocrinology

## 2020-02-06 ENCOUNTER — Telehealth (INDEPENDENT_AMBULATORY_CARE_PROVIDER_SITE_OTHER): Payer: Medicare Other | Admitting: "Endocrinology

## 2020-02-06 VITALS — BP 140/58 | Ht 61.0 in | Wt 176.0 lb

## 2020-02-06 DIAGNOSIS — E039 Hypothyroidism, unspecified: Secondary | ICD-10-CM | POA: Diagnosis not present

## 2020-02-06 MED ORDER — LEVOTHYROXINE SODIUM 50 MCG PO TABS: 50.0000 ug | ORAL_TABLET | Freq: Every day | ORAL | 1 refills | Status: DC

## 2020-02-06 NOTE — Progress Notes (Addendum)
02/06/2020, 8:45 AM                                 Endocrinology Telehealth Visit Follow up Note -During COVID -19 Pandemic  This visit type was conducted  Via telephone due to national recommendations for restrictions regarding the COVID-19 Pandemic  in an effort to limit this patient's exposure and mitigate transmission of the corona virus.   I connected with [name] on 02/06/20 by telephone and verified that I am speaking with the correct person using two identifiers. [name], [id number]. she has verbally consented to this visit.  I was in my office and patient was in her residence. No other persons were with me during the encounter. Patient is assisted by her daughter during this telephone visit. All issues noted in this document were discussed and addressed. The format was not optimal for physical exam.    Subjective:    Patient ID: [name], female    DOB: 18-Feb-1941, PCP Ivy Lynn, NP   Past Medical History:  Diagnosis Date  . Anxiety   . Arthritis   . Bulging lumbar disc   . Chronic lower back pain   . Coronary atherosclerosis of native coronary artery    DES LAD 02/2008, DES LAD 02/2015  . Depression   . Dyslipidemia   . Essential hypertension   . Facial numbness   . Frequent headaches   . GERD (gastroesophageal reflux disease)   . Glaucoma   . Insomnia   . Paroxysmal atrial fibrillation [facility])    Diagnosed October 2019  . PSVT (paroxysmal supraventricular tachycardia) ([place])   . Refusal of blood transfusions as patient is Jehovah's Witness   . Type 2 diabetes mellitus (HCC)    Borderline  . Vitamin D deficiency    Past Surgical History:  Procedure Laterality Date  . CARDIAC CATHETERIZATION N/A 02/23/2015   Procedure: Left Heart Cath and Coronary Angiography;  Surgeon: Sherren Mocha, MD;  Location: [place] [facility];  Service: Cardiovascular;  Laterality: N/A;  . CARDIAC CATHETERIZATION  N/A 02/23/2015   Procedure: Coronary Stent Intervention;  Surgeon: Sherren Mocha, MD;  Location: [place] [facility];  Service: Cardiovascular;  Laterality: N/A;  . CARDIAC CATHETERIZATION N/A 01/04/2016   Procedure: Left Heart Cath and Coronary Angiography;  Surgeon: Peter M Martinique, MD;  Location: [place] [facility];  Service: Cardiovascular;  Laterality: N/A;  . CATARACT EXTRACTION    . CORONARY ANGIOPLASTY  02/23/2015  . CORONARY ANGIOPLASTY WITH STENT PLACEMENT  2009  . DILATION AND CURETTAGE OF UTERUS    . TUBAL LIGATION     Social History   Socioeconomic History  . Marital status: Widowed    Spouse name: Not on file  . Number of children: 8  . Years of education: 10th grade  . Highest education level: Not on file  Occupational History  . Occupation: RETIRED  Tobacco Use  . Smoking status: Never Smoker  . Smokeless tobacco: Never Used  Vaping Use  . Vaping Use: Never used  Substance and Sexual Activity  . Alcohol use: Yes    Alcohol/week: 1.0 standard drink    Types:  1 Glasses of wine per week    Comment: RARE OCCASION  . Drug use: No  . Sexual activity: Never  Other Topics Concern  . Not on file  Social History Narrative   Lives at home alone.   Right-handed.   No caffeine use.   Social Determinants of Health   Financial Resource Strain: Not on file  Food Insecurity: Not on file  Transportation Needs: Not on file  Physical Activity: Not on file  Stress: Not on file  Social Connections: Not on file   Family History  Problem Relation Age of Onset  . Other Mother        "natural causes"  . Heart disease Mother   . Cancer Father        unsure of type   Outpatient Encounter Medications as of 02/06/2020  Medication Sig  . acetaminophen (TYLENOL) 650 MG CR tablet Take 1,300 mg by mouth every 8 (eight) hours as needed for pain.  Marland Kitchen amitriptyline (ELAVIL) 10 MG tablet Take 1 tablet (10 mg total) by mouth at bedtime.  Marland Kitchen apixaban (ELIQUIS) 5 MG TABS tablet Take 1  tablet (5 mg total) by mouth 2 (two) times daily.  . Ascorbic Acid (VITAMIN C) 500 MG tablet Take 500 mg by mouth daily.    . bimatoprost (LUMIGAN) 0.01 % SOLN Place 1 drop into both eyes at bedtime.  . blood glucose meter kit and supplies Dispense based on patient and insurance preference. Use up to four times daily as directed. (FOR ICD-10 E10.9, E11.9).  . Calcium Carbonate Antacid (TUMS PO) Take 2 tablets by mouth 4 (four) times daily as needed (for acid reflux/indigestion).   . Cholecalciferol (VITAMIN D) 400 UNITS capsule Take 400 Units by mouth daily.    . Cyanocobalamin (VITAMIN B 12 PO) Take 1 tablet by mouth daily.  . diclofenac Sodium (VOLTAREN) 1 % GEL Apply 2 g topically 4 (four) times daily. (Patient taking differently: Apply 2 g topically 4 (four) times daily. As needed)  . diltiazem (CARDIZEM CD) 180 MG 24 hr capsule TAKE ONE (1) CAPSULE EACH DAY  . Garlic 973 MG TABS Take 1 tablet by mouth daily.  Marland Kitchen HYDROcodone-acetaminophen (NORCO) 10-325 MG tablet Take 1 tablet by mouth 4 (four) times daily as needed. For pain.  Marland Kitchen ibuprofen (ADVIL,MOTRIN) 200 MG tablet Take 600 mg by mouth every 6 (six) hours as needed (for pain.).   Marland Kitchen levothyroxine (SYNTHROID) 50 MCG tablet Take 1 tablet (50 mcg total) by mouth daily before breakfast.  . losartan (COZAAR) 25 MG tablet TAKE ONE (1) TABLET EACH DAY  . MAGNESIUM CARBONATE PO Take 1 tablet by mouth daily. 400 mg  . Menthol, Topical Analgesic, 4 % GEL Apply 1 application topically 4 (four) times daily as needed (for pain).   . metFORMIN (GLUCOPHAGE-XR) 500 MG 24 hr tablet Take 1 tablet by mouth daily.   . metoprolol tartrate (LOPRESSOR) 100 MG tablet Take 1 tablet (100 mg total) by mouth 2 (two) times daily.  . nitroGLYCERIN (NITROSTAT) 0.4 MG SL tablet Place 0.4 mg under the tongue every 5 (five) minutes as needed for chest pain.  . pantoprazole (PROTONIX) 40 MG tablet TAKE ONE (1) TABLET EACH DAY (Patient not taking: Reported on 02/03/2020)  .  promethazine (PHENERGAN) 25 MG tablet 1 tablet as needed.  . rosuvastatin (CRESTOR) 5 MG tablet Take 1 tablet (5 mg total) by mouth daily.  . timolol (TIMOPTIC) 0.5 % ophthalmic solution Place 1 drop into both eyes daily.  Marland Kitchen  traZODone (DESYREL) 100 MG tablet Take 1 tablet by mouth at bedtime.   . TURMERIC PO Take 1 tablet by mouth daily.  . VENTOLIN HFA 108 (90 Base) MCG/ACT inhaler Inhale 1-2 puffs into the lungs every 4 (four) hours as needed for shortness of breath.   No facility-administered encounter medications on file as of 02/06/2020.   ALLERGIES: No Known Allergies  VACCINATION STATUS: Immunization History  Administered Date(s) Administered  . Fluad Quad(high Dose 65+) 11/20/2019  . Influenza, High Dose Seasonal PF 12/25/2016, 01/07/2018  . Influenza-Unspecified 01/21/2015  . Moderna SARS-COV2 Booster Vaccination 12/31/2019  . Moderna Sars-Covid-2 Vaccination 04/01/2019, 04/29/2019  . Tdap 12/24/2018    HPI Donalda KATALEENA HOLSAPPLE is 79 y.o. female who is being engaged in telehealth via telephone in follow-up after she was seen in consultation for hypothyroidism.    PCP:  Ivy Lynn, NP.  Patient is assisted by her daughter during this telephone visit.  She was sent for a more complete thyroid function test after her recent visit.  Her labs confirm primary hypothyroidism.  She is not on thyroid hormone supplement. Of note, she was treated with amiodarone for almost a year due to cardiac dysrhythmia .  Her last exposure was 3 months ago.  Patient denies any family history of thyroid dysfunction.  She is not currently on antithyroid intervention. She reports weight gain, fatigue, cold intolerance, constipation.  She is not on amiodarone any longer.  Review of Systems Limited as above.  Objective:    Vitals with BMI 02/06/2020 02/03/2020 02/02/2020  Height 5' 1"  5' 1"  5' 1"   Weight 176 lbs 178 lbs 173 lbs  BMI 33.27 35.57 32.2  Systolic 025 427 -  Diastolic 58 58 -   Pulse - 52 -    BP (!) 140/58   Ht 5' 1"  (1.549 m)   Wt 176 lb (79.8 kg)   LMP  (LMP Unknown)   BMI 33.25 kg/m   Wt Readings from Last 3 Encounters:  02/06/20 176 lb (79.8 kg)  02/03/20 178 lb (80.7 kg)  02/02/20 173 lb (78.5 kg)    Physical Exam   CMP ( most recent) CMP     Component Value Date/Time   NA 138 10/21/2019 1347   NA 136 (A) 08/19/2019 0000   K 4.0 10/21/2019 1347   CL 102 10/21/2019 1347   CO2 26 10/21/2019 1347   GLUCOSE 124 (H) 10/21/2019 1347   BUN 15 10/21/2019 1347   BUN 12 08/19/2019 0000   CREATININE 0.80 10/21/2019 1347   CALCIUM 9.3 10/21/2019 1347   PROT 7.3 08/12/2019 1547   PROT 6.7 04/02/2018 1118   ALBUMIN 4.3 08/19/2019 0000   ALBUMIN 4.3 04/02/2018 1118   AST 16 08/19/2019 0000   ALT 13 08/19/2019 0000   ALKPHOS 80 08/19/2019 0000   BILITOT 0.4 08/12/2019 1547   BILITOT 0.3 04/02/2018 1118   GFRNONAA >60 10/21/2019 1347   GFRAA >60 10/21/2019 1347     Diabetic Labs (most recent): Lab Results  Component Value Date   HGBA1C 5.7 08/19/2019   HGBA1C 7.5 (H) 12/17/2017   HGBA1C  02/25/2008    6.1 (NOTE)   The ADA recommends the following therapeutic goal for glycemic   control related to Hgb A1C measurement:   Goal of Therapy:   < 7.0% Hgb A1C   Reference: American Diabetes Association: Clinical Practice   Recommendations 2008, Diabetes Care,  2008, 31:(Suppl 1).     Lipid Panel ( most recent) Lipid Panel  Component Value Date/Time   CHOL 251 (H) 11/11/2019 1124   TRIG 113 11/11/2019 1124   HDL 48 11/11/2019 1124   CHOLHDL 5.2 11/11/2019 1124   VLDL 23 11/11/2019 1124   LDLCALC 180 (H) 11/11/2019 1124      Lab Results  Component Value Date   TSH 20.523 (H) 02/03/2020   TSH 16.400 (H) 01/30/2020   TSH 11.769 (H) 11/11/2019   TSH 5.290 (H) 08/12/2019   TSH 1.134 11/13/2018   TSH 1.422 12/17/2017   TSH 0.564 12/01/2016   FREET4 0.44 (L) 02/03/2020       Assessment & Plan:   1. 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  She is not on amiodarone anymore.  She will need to start thyroid hormone supplement.    I discussed and initiated levothyroxine 50 mcg p.o. daily before breakfast.   - We discussed about the correct intake of her thyroid hormone, on empty stomach at fasting, with water, separated by at least 30 minutes from breakfast and other medications,  and separated by more than 4 hours from calcium, iron, multivitamins, acid reflux medications (PPIs). -Patient is made aware of the fact that thyroid hormone replacement is needed for life, dose to be adjusted by periodic monitoring of thyroid function tests.  -After her next visit, she will be considered for thyroid ultrasound given her clinical goiter.  - she is advised to maintain close follow up with Ivy Lynn, NP for primary care needs.      - Time spent on this patient care encounter:  20 minutes of which 50% was spent in  counseling and the rest reviewing  her current and  previous labs / studies and medications  doses and developing a plan for long term care. [name]  participated in the discussions, expressed understanding, and voiced agreement with the above plans.  All questions were answered to her satisfaction. she is encouraged to contact clinic should she have any questions or concerns prior to her return visit.   Follow up plan: Return in about 5 weeks (around 03/12/2020) for F/U with Pre-visit Labs.   Glade Lloyd, MD [facility] [address] [place], [place] [address] Phone: [phone]  Fax: [phone]     02/06/2020, 8:45 AM  This note was partially dictated with voice recognition software. Similar sounding words can be transcribed inadequately or may not   be corrected upon review.

## 2020-02-09 DIAGNOSIS — Z20822 Contact with and (suspected) exposure to covid-19: Secondary | ICD-10-CM | POA: Diagnosis not present

## 2020-02-21 DIAGNOSIS — I219 Acute myocardial infarction, unspecified: Secondary | ICD-10-CM

## 2020-02-21 HISTORY — DX: Acute myocardial infarction, unspecified: I21.9

## 2020-02-24 DIAGNOSIS — Z20822 Contact with and (suspected) exposure to covid-19: Secondary | ICD-10-CM | POA: Diagnosis not present

## 2020-02-25 ENCOUNTER — Ambulatory Visit: Payer: Medicare Other | Admitting: Internal Medicine

## 2020-02-26 ENCOUNTER — Ambulatory Visit (INDEPENDENT_AMBULATORY_CARE_PROVIDER_SITE_OTHER): Payer: Medicare Other | Admitting: *Deleted

## 2020-02-26 DIAGNOSIS — Z0000 Encounter for general adult medical examination without abnormal findings: Secondary | ICD-10-CM | POA: Diagnosis not present

## 2020-02-26 NOTE — Progress Notes (Signed)
MEDICARE ANNUAL WELLNESS VISIT  02/26/2020  Telephone Visit Disclaimer This Medicare AWV was conducted by telephone due to national recommendations for restrictions regarding the COVID-19 Pandemic (e.g. social distancing).  I verified, using two identifiers, that I am speaking with [name] or their authorized healthcare agent. I discussed the limitations, risks, security, and privacy concerns of performing an evaluation and management service by telephone and the potential availability of an in-person appointment in the future. The patient expressed understanding and agreed to proceed.  Location of Patient: home Location of Provider (nurse):  office  Subjective:    [name] is a 80 y.o. female patient of Ivy Lynn, NP who had a Medicare Annual Wellness Visit today via telephone. [name] is Retired and lives alone. she has 8 children. she reports that she is socially active and does interact with friends/family regularly. she is minimally physically active and enjoys doing word search puzzles and working jigsaw puzzles.  Patient Care Team: Ivy Lynn, NP as PCP - General (Nurse Practitioner) Satira Sark, MD as PCP - Cardiology (Cardiology) Leta Baptist, MD as Consulting Physician (Otolaryngology) Lavera Guise, [facility] (Pharmacist)  Advanced Directives 02/26/2020 12/17/2017 12/17/2017 01/04/2016 02/23/2015 11/01/2012  Does Patient Have a Medical Advance Directive? No No No No No Patient does not have advance directive;Patient would not like information  Would patient like information on creating a medical advance directive? No - Patient declined No - Patient declined No - Patient declined No - patient declined information No - patient declined information -  Pre-existing out of facility DNR order (yellow form or pink MOST form) - - - - - No    Hospital Utilization Over the Past 12 Months: # of hospitalizations or ER visits: 0 # of surgeries: 0  Review of Systems     Patient reports that her overall health is worse compared to last year.  History obtained from the patient  Patient Reported Readings (BP, Pulse, CBG, Weight, etc) none  Pain Assessment Pain : No/denies pain     Current Medications & Allergies (verified) Allergies as of 02/26/2020   No Known Allergies     Medication List       Accurate as of February 26, 2020  2:15 PM. If you have any questions, ask your nurse or doctor.        acetaminophen 650 MG CR tablet Commonly known as: TYLENOL Take 1,300 mg by mouth every 8 (eight) hours as needed for pain.   amitriptyline 10 MG tablet Commonly known as: ELAVIL Take 1 tablet (10 mg total) by mouth at bedtime.   apixaban 5 MG Tabs tablet Commonly known as: Eliquis Take 1 tablet (5 mg total) by mouth 2 (two) times daily.   bimatoprost 0.01 % Soln Commonly known as: LUMIGAN Place 1 drop into both eyes at bedtime.   blood glucose meter kit and supplies Dispense based on patient and insurance preference. Use up to four times daily as directed. (FOR ICD-10 E10.9, E11.9).   diclofenac Sodium 1 % Gel Commonly known as: Voltaren Apply 2 g topically 4 (four) times daily. What changed: additional instructions   diltiazem 180 MG 24 hr capsule Commonly known as: CARDIZEM CD TAKE ONE (1) CAPSULE EACH DAY   Garlic 226 MG Tabs Take 1 tablet by mouth daily.   HYDROcodone-acetaminophen 10-325 MG tablet Commonly known as: NORCO Take 1 tablet by mouth 4 (four) times daily as needed. For pain.   ibuprofen 200 MG tablet  Commonly known as: ADVIL Take 600 mg by mouth every 6 (six) hours as needed (for pain.).   levothyroxine 50 MCG tablet Commonly known as: SYNTHROID Take 1 tablet (50 mcg total) by mouth daily before breakfast.   losartan 25 MG tablet Commonly known as: COZAAR TAKE ONE (1) TABLET EACH DAY   MAGNESIUM CARBONATE PO Take 1 tablet by mouth daily. 400 mg   Menthol (Topical Analgesic) 4 % Gel Apply 1 application  topically 4 (four) times daily as needed (for pain).   metFORMIN 500 MG 24 hr tablet Commonly known as: GLUCOPHAGE-XR Take 1 tablet by mouth daily.   metoprolol tartrate 100 MG tablet Commonly known as: LOPRESSOR Take 1 tablet (100 mg total) by mouth 2 (two) times daily.   nitroGLYCERIN 0.4 MG SL tablet Commonly known as: NITROSTAT Place 0.4 mg under the tongue every 5 (five) minutes as needed for chest pain.   pantoprazole 40 MG tablet Commonly known as: PROTONIX TAKE ONE (1) TABLET EACH DAY   promethazine 25 MG tablet Commonly known as: PHENERGAN 1 tablet as needed.   rosuvastatin 5 MG tablet Commonly known as: CRESTOR TAKE ONE (1) TABLET EACH DAY   timolol 0.5 % ophthalmic solution Commonly known as: TIMOPTIC Place 1 drop into both eyes daily.   traZODone 100 MG tablet Commonly known as: DESYREL Take 1 tablet by mouth at bedtime.   TUMS PO Take 2 tablets by mouth 4 (four) times daily as needed (for acid reflux/indigestion).   TURMERIC PO Take 1 tablet by mouth daily.   Ventolin HFA 108 (90 Base) MCG/ACT inhaler Generic drug: albuterol Inhale 1-2 puffs into the lungs every 4 (four) hours as needed for shortness of breath.   VITAMIN B 12 PO Take 1 tablet by mouth daily.   vitamin C 500 MG tablet Commonly known as: ASCORBIC ACID Take 500 mg by mouth daily.   Vitamin D 400 units capsule Take 400 Units by mouth daily.       History (reviewed): Past Medical History:  Diagnosis Date  . Anxiety   . Arthritis   . Bulging lumbar disc   . Chronic lower back pain   . Coronary atherosclerosis of native coronary artery    DES LAD 02/2008, DES LAD 02/2015  . Depression   . Dyslipidemia   . Essential hypertension   . Facial numbness   . Frequent headaches   . GERD (gastroesophageal reflux disease)   . Glaucoma   . Insomnia   . Paroxysmal atrial fibrillation [facility])    Diagnosed October 2019  . PSVT (paroxysmal supraventricular tachycardia) ([place])   . Refusal  of blood transfusions as patient is Jehovah's Witness   . Thyroid disease   . Type 2 diabetes mellitus (HCC)    Borderline  . Vitamin D deficiency    Past Surgical History:  Procedure Laterality Date  . CARDIAC CATHETERIZATION N/A 02/23/2015   Procedure: Left Heart Cath and Coronary Angiography;  Surgeon: Sherren Mocha, MD;  Location: [place] [facility];  Service: Cardiovascular;  Laterality: N/A;  . CARDIAC CATHETERIZATION N/A 02/23/2015   Procedure: Coronary Stent Intervention;  Surgeon: Sherren Mocha, MD;  Location: [place] [facility];  Service: Cardiovascular;  Laterality: N/A;  . CARDIAC CATHETERIZATION N/A 01/04/2016   Procedure: Left Heart Cath and Coronary Angiography;  Surgeon: Peter M Martinique, MD;  Location: [place] [facility];  Service: Cardiovascular;  Laterality: N/A;  . CATARACT EXTRACTION    . CORONARY ANGIOPLASTY  02/23/2015  . CORONARY ANGIOPLASTY WITH  STENT PLACEMENT  2009  . DILATION AND CURETTAGE OF UTERUS    . TUBAL LIGATION     Family History  Problem Relation Age of Onset  . Other Mother        "natural causes"  . Heart disease Mother   . Cancer Father        unsure of type   Social History   Socioeconomic History  . Marital status: Widowed    Spouse name: Not on file  . Number of children: 8  . Years of education: 10th grade  . Highest education level: 10th grade  Occupational History  . Occupation: RETIRED  Tobacco Use  . Smoking status: Never Smoker  . Smokeless tobacco: Never Used  Vaping Use  . Vaping Use: Never used  Substance and Sexual Activity  . Alcohol use: Yes    Alcohol/week: 1.0 standard drink    Types: 1 Glasses of wine per week    Comment: RARE OCCASION  . Drug use: No  . Sexual activity: Not Currently  Other Topics Concern  . Not on file  Social History Narrative   Lives at home alone.   Right-handed.   No caffeine use.   Social Determinants of Health   Financial Resource Strain: Low Risk   . Difficulty of Paying Living  Expenses: Not hard at all  Food Insecurity: No Food Insecurity  . Worried About Charity fundraiser in the Last Year: Never true  . Ran Out of Food in the Last Year: Never true  Transportation Needs: No Transportation Needs  . Lack of Transportation (Medical): No  . Lack of Transportation (Non-Medical): No  Physical Activity: Inactive  . Days of Exercise per Week: 0 days  . Minutes of Exercise per Session: 0 min  Stress: No Stress Concern Present  . Feeling of Stress : Not at all  Social Connections: Moderately Integrated  . Frequency of Communication with Friends and Family: More than three times a week  . Frequency of Social Gatherings with Friends and Family: More than three times a week  . Attends Religious Services: More than 4 times per year  . Active Member of Clubs or Organizations: Yes  . Attends Archivist Meetings: More than 4 times per year  . Marital Status: Widowed    Activities of Daily Living In your present state of health, do you have any difficulty performing the following activities: 02/26/2020  Hearing? N  Vision? Y  Comment blurred vision-has appt scheduled with the eye doctor for exam  Difficulty concentrating or making decisions? N  Walking or climbing stairs? Y  Comment legs hurt but she says she has been told it is coming from her back  Dressing or bathing? N  Doing errands, shopping? N  Preparing Food and eating ? N  Using the Toilet? N  In the past six months, have you accidently leaked urine? N  Do you have problems with loss of bowel control? N  Managing your Medications? N  Managing your Finances? N  Housekeeping or managing your Housekeeping? N  Some recent data might be hidden    Patient Education/ Literacy How often do you need to have someone help you when you read instructions, pamphlets, or other written materials from your doctor or pharmacy?: 1 - Never What is the last grade level you completed in school?: 10th  grade  Exercise Current Exercise Habits: The patient does not participate in regular exercise at present, Exercise limited by: cardiac condition(s);respiratory  conditions(s);orthopedic condition(s)  Diet Patient reports consuming 2 meals a day and 1 snack(s) a day Patient reports that her primary diet is: Regular Patient reports that she does have regular access to food.   Depression Screen PHQ 2/9 Scores 02/26/2020 01/09/2020 11/20/2019 10/02/2019  PHQ - 2 Score 3 0 0 2  PHQ- 9 Score 8 - - 14     Fall Risk Fall Risk  02/26/2020 01/09/2020 11/20/2019 10/02/2019  Falls in the past year? 0 0 1 1  Number falls in past yr: - - 0 0  Injury with Fall? - - 1 1  Risk for fall due to : - - History of fall(s) History of fall(s)  Follow up - - Falls evaluation completed Falls evaluation completed     Objective:  ALESANDRA SMART seemed alert and oriented and she participated appropriately during our telephone visit.  Blood Pressure Weight BMI  BP Readings from Last 3 Encounters:  02/06/20 (!) 140/58  02/03/20 (!) 140/58  01/09/20 (!) 182/70   Wt Readings from Last 3 Encounters:  02/06/20 176 lb (79.8 kg)  02/03/20 178 lb (80.7 kg)  02/02/20 173 lb (78.5 kg)   BMI Readings from Last 1 Encounters:  02/06/20 33.25 kg/m    *Unable to obtain current vital signs, weight, and BMI due to telephone visit type  Hearing/Vision  . Romaine did not seem to have difficulty with hearing/understanding during the telephone conversation . Reports that she has not had a formal eye exam by an eye care professional within the past year . Reports that she has not had a formal hearing evaluation within the past year *Unable to fully assess hearing and vision during telephone visit type  Cognitive Function: 6CIT Screen 02/26/2020  What Year? 0 points  What month? 0 points  What time? 0 points  Count back from 20 0 points  Months in reverse 0 points  Repeat phrase 4 points  Total Score 4   (Normal:0-7,  Significant for Dysfunction: >8)  Normal Cognitive Function Screening: Yes   Immunization & Health Maintenance Record Immunization History  Administered Date(s) Administered  . Fluad Quad(high Dose 65+) 11/20/2019  . Influenza, High Dose Seasonal PF 12/25/2016, 01/07/2018  . Influenza-Unspecified 01/21/2015  . Moderna SARS-COV2 Booster Vaccination 12/31/2019  . Moderna Sars-Covid-2 Vaccination 04/01/2019, 04/29/2019  . Tdap 12/24/2018    Health Maintenance  Topic Date Due  . FOOT EXAM  Never done  . OPHTHALMOLOGY EXAM  Never done  . DEXA SCAN  Never done  . HEMOGLOBIN A1C  02/18/2020  . Hepatitis C Screening  01/08/2021 (Originally [birth year])  . PNA vac Low Risk Adult (1 of 2 - PCV13) 01/08/2021 (Originally 07/02/2005)  . TETANUS/TDAP  12/23/2028  . INFLUENZA VACCINE  Completed  . COVID-19 Vaccine  Completed       Assessment  This is a routine wellness examination for MALYNDA SMOLINSKI.  Health Maintenance: Due or Overdue Health Maintenance Due  Topic Date Due  . FOOT EXAM  Never done  . OPHTHALMOLOGY EXAM  Never done  . DEXA SCAN  Never done  . HEMOGLOBIN A1C  02/18/2020    Breena M Proctor does not need a referral for Community Assistance: Care Management:   no Social Work:    no Prescription Assistance:  no Nutrition/Diabetes Education:  no   Plan:  Personalized Goals Goals Addressed            This Visit's Progress   . Increase physical activity  Personalized Health Maintenance & Screening Recommendations  Bone densitometry screening Diabetes screening Shingrix vaccine  Lung Cancer Screening Recommended: no (Low Dose CT Chest recommended if Age 57-80 years, 30 pack-year currently smoking OR have quit w/in past 15 years) Hepatitis C Screening recommended: no HIV Screening recommended: no  Advanced Directives: Written information was not prepared per patient's request.  Referrals & Orders No orders of the defined types were placed in this  encounter.   Follow-up Plan . Follow-up with Ivy Lynn, NP as planned . Schedule your DEXA scan as discussed . Keep your appt for your Diabetic Eye Exam  . Consider Shingrix vaccine   I have personally reviewed and noted the following in the patient's chart:   . Medical and social history . Use of alcohol, tobacco or illicit drugs  . Current medications and supplements . Functional ability and status . Nutritional status . Physical activity . Advanced directives . List of other physicians . Hospitalizations, surgeries, and ER visits in previous 12 months . Vitals . Screenings to include cognitive, depression, and falls . Referrals and appointments  In addition, I have reviewed and discussed with [name] certain preventive protocols, quality metrics, and best practice recommendations. A written personalized care plan for preventive services as well as general preventive health recommendations is available and can be mailed to the patient at her request.      Milas Hock, LPN  04/26/6281

## 2020-02-26 NOTE — Patient Instructions (Signed)
Preventive Care 38 Years and Older, Female Preventive care refers to lifestyle choices and visits with your health care provider that can promote health and wellness. This includes:  A yearly physical exam. This is also called an annual well check.  Regular dental and eye exams.  Immunizations.  Screening for certain conditions.  Healthy lifestyle choices, such as diet and exercise. What can I expect for my preventive care visit? Physical exam Your health care provider will check:  Height and weight. These may be used to calculate body mass index (BMI), which is a measurement that tells if you are at a healthy weight.  Heart rate and blood pressure.  Your skin for abnormal spots. Counseling Your health care provider may ask you questions about:  Alcohol, tobacco, and drug use.  Emotional well-being.  Home and relationship well-being.  Sexual activity.  Eating habits.  History of falls.  Memory and ability to understand (cognition).  Work and work Statistician.  Pregnancy and menstrual history. What immunizations do I need?  Influenza (flu) vaccine  This is recommended every year. Tetanus, diphtheria, and pertussis (Tdap) vaccine  You may need a Td booster every 10 years. Varicella (chickenpox) vaccine  You may need this vaccine if you have not already been vaccinated. Zoster (shingles) vaccine  You may need this after age 33. Pneumococcal conjugate (PCV13) vaccine  One dose is recommended after age 33. Pneumococcal polysaccharide (PPSV23) vaccine  One dose is recommended after age 72. Measles, mumps, and rubella (MMR) vaccine  You may need at least one dose of MMR if you were born in 1957 or later. You may also need a second dose. Meningococcal conjugate (MenACWY) vaccine  You may need this if you have certain conditions. Hepatitis A vaccine  You may need this if you have certain conditions or if you travel or work in places where you may be exposed  to hepatitis A. Hepatitis B vaccine  You may need this if you have certain conditions or if you travel or work in places where you may be exposed to hepatitis B. Haemophilus influenzae type b (Hib) vaccine  You may need this if you have certain conditions. You may receive vaccines as individual doses or as more than one vaccine together in one shot (combination vaccines). Talk with your health care provider about the risks and benefits of combination vaccines. What tests do I need? Blood tests  Lipid and cholesterol levels. These may be checked every 5 years, or more frequently depending on your overall health.  Hepatitis C test.  Hepatitis B test. Screening  Lung cancer screening. You may have this screening every year starting at age 39 if you have a 30-pack-year history of smoking and currently smoke or have quit within the past 15 years.  Colorectal cancer screening. All adults should have this screening starting at age 36 and continuing until age 15. Your health care provider may recommend screening at age 23 if you are at increased risk. You will have tests every 1-10 years, depending on your results and the type of screening test.  Diabetes screening. This is done by checking your blood sugar (glucose) after you have not eaten for a while (fasting). You may have this done every 1-3 years.  Mammogram. This may be done every 1-2 years. Talk with your health care provider about how often you should have regular mammograms.  BRCA-related cancer screening. This may be done if you have a family history of breast, ovarian, tubal, or peritoneal cancers.  Other tests  Sexually transmitted disease (STD) testing.  Bone density scan. This is done to screen for osteoporosis. You may have this done starting at age 44. Follow these instructions at home: Eating and drinking  Eat a diet that includes fresh fruits and vegetables, whole grains, lean protein, and low-fat dairy products. Limit  your intake of foods with high amounts of sugar, saturated fats, and salt.  Take vitamin and mineral supplements as recommended by your health care provider.  Do not drink alcohol if your health care provider tells you not to drink.  If you drink alcohol: ? Limit how much you have to 0-1 drink a day. ? Be aware of how much alcohol is in your drink. In the [place] one drink equals one 12 oz bottle of beer (355 mL), one 5 oz glass of wine (148 mL), or one 1 oz glass of hard liquor (44 mL). Lifestyle  Take daily care of your teeth and gums.  Stay active. Exercise for at least 30 minutes on 5 or more days each week.  Do not use any products that contain nicotine or tobacco, such as cigarettes, e-cigarettes, and chewing tobacco. If you need help quitting, ask your health care provider.  If you are sexually active, practice safe sex. Use a condom or other form of protection in order to prevent STIs (sexually transmitted infections).  Talk with your health care provider about taking a low-dose aspirin or statin. What's next?  Go to your health care provider once a year for a well check visit.  Ask your health care provider how often you should have your eyes and teeth checked.  Stay up to date on all vaccines. This information is not intended to replace advice given to you by your health care provider. Make sure you discuss any questions you have with your health care provider. Document Revised: 01/31/2018 Document Reviewed: 01/31/2018 Elsevier Patient Education  2020 [employer].

## 2020-03-03 ENCOUNTER — Other Ambulatory Visit (HOSPITAL_COMMUNITY)
Admission: RE | Admit: 2020-03-03 | Discharge: 2020-03-03 | Disposition: A | Discharge status: Home / Self Care | Payer: Medicare Other | Source: Ambulatory Visit | Attending: "Endocrinology | Admitting: "Endocrinology

## 2020-03-03 ENCOUNTER — Other Ambulatory Visit: Payer: Self-pay

## 2020-03-03 DIAGNOSIS — E039 Hypothyroidism, unspecified: Secondary | ICD-10-CM | POA: Insufficient documentation

## 2020-03-03 LAB — T4, FREE: Free T4: 0.65 ng/dL (ref 0.61–1.12)

## 2020-03-03 LAB — TSH: TSH: 10.867 u[IU]/mL — ABNORMAL HIGH (ref 0.350–4.500)

## 2020-03-05 ENCOUNTER — Encounter: Payer: Self-pay | Admitting: Nurse Practitioner

## 2020-03-05 ENCOUNTER — Other Ambulatory Visit: Payer: Self-pay

## 2020-03-05 ENCOUNTER — Ambulatory Visit (INDEPENDENT_AMBULATORY_CARE_PROVIDER_SITE_OTHER): Payer: Medicare Other | Admitting: Nurse Practitioner

## 2020-03-05 VITALS — BP 165/52 | HR 67 | Ht 61.0 in | Wt 184.0 lb

## 2020-03-05 DIAGNOSIS — E032 Hypothyroidism due to medicaments and other exogenous substances: Secondary | ICD-10-CM | POA: Diagnosis not present

## 2020-03-05 MED ORDER — LEVOTHYROXINE SODIUM 75 MCG PO TABS: 75.0000 ug | ORAL_TABLET | Freq: Every day | ORAL | 0 refills | Status: DC

## 2020-03-05 NOTE — Progress Notes (Signed)
03/05/2020, 9:32 AM                          Endocrinology Follow Up Visit   Subjective:    Patient ID: [name], female    DOB: [birth year], PCP Ivy Lynn, NP   Past Medical History:  Diagnosis Date  . Anxiety   . Arthritis   . Bulging lumbar disc   . Chronic lower back pain   . Coronary atherosclerosis of native coronary artery    DES LAD 02/2008, DES LAD 02/2015  . Depression   . Dyslipidemia   . Essential hypertension   . Facial numbness   . Frequent headaches   . GERD (gastroesophageal reflux disease)   . Glaucoma   . Insomnia   . Paroxysmal atrial fibrillation [facility])    Diagnosed October 2019  . PSVT (paroxysmal supraventricular tachycardia) ([place])   . Refusal of blood transfusions as patient is Jehovah's Witness   . Thyroid disease   . Type 2 diabetes mellitus (HCC)    Borderline  . Vitamin D deficiency    Past Surgical History:  Procedure Laterality Date  . CARDIAC CATHETERIZATION N/A 02/23/2015   Procedure: Left Heart Cath and Coronary Angiography;  Surgeon: Sherren Mocha, MD;  Location: [place] [facility];  Service: Cardiovascular;  Laterality: N/A;  . CARDIAC CATHETERIZATION N/A 02/23/2015   Procedure: Coronary Stent Intervention;  Surgeon: Sherren Mocha, MD;  Location: [place] [facility];  Service: Cardiovascular;  Laterality: N/A;  . CARDIAC CATHETERIZATION N/A 01/04/2016   Procedure: Left Heart Cath and Coronary Angiography;  Surgeon: Peter M Martinique, MD;  Location: [place] [facility];  Service: Cardiovascular;  Laterality: N/A;  . CATARACT EXTRACTION    . CORONARY ANGIOPLASTY  02/23/2015  . CORONARY ANGIOPLASTY WITH STENT PLACEMENT  2009  . DILATION AND CURETTAGE OF UTERUS    . TUBAL LIGATION     Social History   Socioeconomic History  . Marital status: Widowed    Spouse name: Not on file  . Number of children: 8  . Years of education: 10th grade  . Highest education level: 10th grade   Occupational History  . Occupation: RETIRED  Tobacco Use  . Smoking status: Never Smoker  . Smokeless tobacco: Never Used  Vaping Use  . Vaping Use: Never used  Substance and Sexual Activity  . Alcohol use: Yes    Alcohol/week: 1.0 standard drink    Types: 1 Glasses of wine per week    Comment: RARE OCCASION  . Drug use: No  . Sexual activity: Not Currently  Other Topics Concern  . Not on file  Social History Narrative   Lives at home alone.   Right-handed.   No caffeine use.   Social Determinants of Health   Financial Resource Strain: Low Risk   . Difficulty of Paying Living Expenses: Not hard at all  Food Insecurity: No Food Insecurity  . Worried About Charity fundraiser in the Last Year: Never true  . Ran Out of Food in the Last Year: Never true  Transportation Needs: No Transportation Needs  . Lack of Transportation (Medical): No  . Lack of Transportation (Non-Medical): No  Physical Activity:  Inactive  . Days of Exercise per Week: 0 days  . Minutes of Exercise per Session: 0 min  Stress: No Stress Concern Present  . Feeling of Stress : Not at all  Social Connections: Moderately Integrated  . Frequency of Communication with Friends and Family: More than three times a week  . Frequency of Social Gatherings with Friends and Family: More than three times a week  . Attends Religious Services: More than 4 times per year  . Active Member of Clubs or Organizations: Yes  . Attends Archivist Meetings: More than 4 times per year  . Marital Status: Widowed   Family History  Problem Relation Age of Onset  . Other Mother        "natural causes"  . Heart disease Mother   . Cancer Father        unsure of type   Outpatient Encounter Medications as of 03/05/2020  Medication Sig  . acetaminophen (TYLENOL) 650 MG CR tablet Take 1,300 mg by mouth every 8 (eight) hours as needed for pain.  Marland Kitchen amitriptyline (ELAVIL) 10 MG tablet Take 1 tablet (10 mg total) by mouth at  bedtime.  Marland Kitchen apixaban (ELIQUIS) 5 MG TABS tablet Take 1 tablet (5 mg total) by mouth 2 (two) times daily.  . Ascorbic Acid (VITAMIN C) 500 MG tablet Take 500 mg by mouth daily.    . bimatoprost (LUMIGAN) 0.01 % SOLN Place 1 drop into both eyes at bedtime.  . blood glucose meter kit and supplies Dispense based on patient and insurance preference. Use up to four times daily as directed. (FOR ICD-10 E10.9, E11.9).  . Calcium Carbonate Antacid (TUMS PO) Take 2 tablets by mouth 4 (four) times daily as needed (for acid reflux/indigestion).   . Cholecalciferol (VITAMIN D) 400 UNITS capsule Take 400 Units by mouth daily.    . Cyanocobalamin (VITAMIN B 12 PO) Take 1 tablet by mouth daily.  . diclofenac Sodium (VOLTAREN) 1 % GEL Apply 2 g topically 4 (four) times daily. (Patient taking differently: Apply 2 g topically 4 (four) times daily. As needed)  . diltiazem (CARDIZEM CD) 180 MG 24 hr capsule TAKE ONE (1) CAPSULE EACH DAY  . Garlic 161 MG TABS Take 1 tablet by mouth daily.  Marland Kitchen HYDROcodone-acetaminophen (NORCO) 10-325 MG tablet Take 1 tablet by mouth 4 (four) times daily as needed. For pain.  Marland Kitchen ibuprofen (ADVIL,MOTRIN) 200 MG tablet Take 600 mg by mouth every 6 (six) hours as needed (for pain.).   Marland Kitchen losartan (COZAAR) 25 MG tablet TAKE ONE (1) TABLET EACH DAY  . MAGNESIUM CARBONATE PO Take 1 tablet by mouth daily. 400 mg  . Menthol, Topical Analgesic, 4 % GEL Apply 1 application topically 4 (four) times daily as needed (for pain).   . metFORMIN (GLUCOPHAGE-XR) 500 MG 24 hr tablet Take 1 tablet by mouth daily.   . metoprolol tartrate (LOPRESSOR) 100 MG tablet Take 1 tablet (100 mg total) by mouth 2 (two) times daily.  . nitroGLYCERIN (NITROSTAT) 0.4 MG SL tablet Place 0.4 mg under the tongue every 5 (five) minutes as needed for chest pain.  . pantoprazole (PROTONIX) 40 MG tablet TAKE ONE (1) TABLET EACH DAY  . promethazine (PHENERGAN) 25 MG tablet 1 tablet as needed.  . rosuvastatin (CRESTOR) 5 MG tablet  TAKE ONE (1) TABLET EACH DAY  . timolol (TIMOPTIC) 0.5 % ophthalmic solution Place 1 drop into both eyes daily.  . traZODone (DESYREL) 100 MG tablet Take 1 tablet by mouth  at bedtime.   . TURMERIC PO Take 1 tablet by mouth daily.  . VENTOLIN HFA 108 (90 Base) MCG/ACT inhaler Inhale 1-2 puffs into the lungs every 4 (four) hours as needed for shortness of breath.  . [DISCONTINUED] levothyroxine (SYNTHROID) 50 MCG tablet Take 1 tablet (50 mcg total) by mouth daily before breakfast.  . levothyroxine (SYNTHROID) 75 MCG tablet Take 1 tablet (75 mcg total) by mouth daily before breakfast.   No facility-administered encounter medications on file as of 03/05/2020.   ALLERGIES: No Known Allergies  VACCINATION STATUS: Immunization History  Administered Date(s) Administered  . Fluad Quad(high Dose 65+) 11/20/2019  . Influenza, High Dose Seasonal PF 12/25/2016, 01/07/2018  . Influenza-Unspecified 01/21/2015  . Moderna SARS-COV2 Booster Vaccination 12/31/2019  . Moderna Sars-Covid-2 Vaccination 04/01/2019, 04/29/2019  . Tdap 12/24/2018    Thyroid Problem Presents for follow-up visit. Symptoms include fatigue and weight gain. Patient reports no anxiety, cold intolerance, constipation, depressed mood, heat intolerance, palpitations or tremors. The symptoms have been stable.   [name] is 80 y.o. female who is being engaged in telehealth via telephone in follow-up after she was seen in consultation for hypothyroidism.    PCP:  Ivy Lynn, NP.  Patient is assisted by her daughter during visit.  She was sent for a more complete thyroid function test after her recent visit.  Her labs confirm primary hypothyroidism.  She is not on thyroid hormone supplement. Of note, she was treated with amiodarone for almost a year due to cardiac dysrhythmia .  Her last exposure was 4 months ago.  Patient denies any family history of thyroid dysfunction.  She is not currently on antithyroid intervention. She  reports weight gain, fatigue, cold intolerance, constipation.  She is not on amiodarone any longer.  Review of systems  Constitutional: + steadily increasing body weight,  current Body mass index is 34.77 kg/m. , + fatigue, no subjective hyperthermia, no subjective hypothermia Eyes: no blurry vision, no xerophthalmia ENT: no sore throat, no nodules palpated in throat, no dysphagia/odynophagia, no hoarseness Cardiovascular: no chest pain, no shortness of breath, no palpitations, no leg swelling Respiratory: no cough, no shortness of breath Gastrointestinal: no nausea/vomiting/diarrhea Musculoskeletal: no muscle/joint aches Skin: no rashes, no hyperemia Neurological: no tremors, no numbness, no tingling, no dizziness Psychiatric: no depression, no anxiety  Objective:    BP (!) 165/52 (BP Location: Left Arm, Patient Position: Sitting)   Pulse 67   Ht 5' 1"  (1.549 m)   Wt 184 lb (83.5 kg)   LMP  (LMP Unknown)   BMI 34.77 kg/m   Wt Readings from Last 3 Encounters:  03/05/20 184 lb (83.5 kg)  02/06/20 176 lb (79.8 kg)  02/03/20 178 lb (80.7 kg)    BP Readings from Last 3 Encounters:  03/05/20 (!) 165/52  02/06/20 (!) 140/58  02/03/20 (!) 140/58     Physical Exam- Limited  Constitutional:  Body mass index is 34.77 kg/m. , not in acute distress, normal state of mind Eyes:  EOMI, no exophthalmos Neck: Supple Thyroid: + mild goiter Cardiovascular: RRR, no murmers, rubs, or gallops, no edema Respiratory: Adequate breathing efforts, no crackles, rales, rhonchi, or wheezing Musculoskeletal: no gross deformities, strength intact in all four extremities, no gross restriction of joint movements Skin:  no rashes, no hyperemia Neurological: no tremor with outstretched hands  CMP ( most recent) CMP     Component Value Date/Time   NA 138 10/21/2019 1347   NA 136 (A) 08/19/2019 0000  K 4.0 10/21/2019 1347   CL 102 10/21/2019 1347   CO2 26 10/21/2019 1347   GLUCOSE 124 (H)  10/21/2019 1347   BUN 15 10/21/2019 1347   BUN 12 08/19/2019 0000   CREATININE 0.80 10/21/2019 1347   CALCIUM 9.3 10/21/2019 1347   PROT 7.3 08/12/2019 1547   PROT 6.7 04/02/2018 1118   ALBUMIN 4.3 08/19/2019 0000   ALBUMIN 4.3 04/02/2018 1118   AST 16 08/19/2019 0000   ALT 13 08/19/2019 0000   ALKPHOS 80 08/19/2019 0000   BILITOT 0.4 08/12/2019 1547   BILITOT 0.3 04/02/2018 1118   GFRNONAA >60 10/21/2019 1347   GFRAA >60 10/21/2019 1347     Diabetic Labs (most recent): Lab Results  Component Value Date   HGBA1C 5.7 08/19/2019   HGBA1C 7.5 (H) 12/17/2017   HGBA1C  02/25/2008    6.1 (NOTE)   The ADA recommends the following therapeutic goal for glycemic   control related to Hgb A1C measurement:   Goal of Therapy:   < 7.0% Hgb A1C   Reference: American Diabetes Association: Clinical Practice   Recommendations 2008, Diabetes Care,  2008, 31:(Suppl 1).     Lipid Panel ( most recent) Lipid Panel     Component Value Date/Time   CHOL 251 (H) 11/11/2019 1124   TRIG 113 11/11/2019 1124   HDL 48 11/11/2019 1124   CHOLHDL 5.2 11/11/2019 1124   VLDL 23 11/11/2019 1124   LDLCALC 180 (H) 11/11/2019 1124      Lab Results  Component Value Date   TSH 10.867 (H) 03/03/2020   TSH 20.523 (H) 02/03/2020   TSH 16.400 (H) 01/30/2020   TSH 11.769 (H) 11/11/2019   TSH 5.290 (H) 08/12/2019   TSH 1.134 11/13/2018   TSH 1.422 12/17/2017   TSH 0.564 12/01/2016   FREET4 0.65 03/03/2020   FREET4 0.44 (L) 02/03/2020       Assessment & Plan:   1. 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  She is not on amiodarone anymore.  She started on thyroid hormone replacement therapy at last visit and has seen some improvement in symptoms.    Her previsit thyroid functions tests are consistent with  under-replacement.  She will tolerate increase in her Levothyroxine to 75 mcg po daily before breakfast.    - We discussed about the correct intake of her thyroid hormone, on empty stomach at fasting, with water, separated by at least 30 minutes from breakfast and other medications,  and separated by more than 4 hours from calcium, iron, multivitamins, acid reflux medications (PPIs). -Patient is made aware of the fact that thyroid hormone replacement is needed for life, dose to be adjusted by periodic monitoring of thyroid function tests.  - Ordered thyroid ultrasound due to mild clinical goiter.  - she is advised to maintain close follow up with Ivy Lynn, NP for primary care needs.      - Time spent on this patient care encounter:  20 minutes of which 50% was spent in  counseling and the rest reviewing  her current and  previous labs / studies and medications  doses and developing a plan for long term care. [name]  participated in the discussions, expressed understanding, and voiced agreement with the above plans.  All questions were answered to her satisfaction. she is encouraged to  contact clinic should she have any questions or concerns prior to her return visit.   Follow up plan: Return in about 2 months (around 05/03/2020) for Thyroid follow up, Previsit labs, thyroid ultrasound.   Rayetta Pigg, [facility] [address] [place], [place] [address] Phone: [phone] Fax: [phone]     03/05/2020, 9:32 AM

## 2020-03-05 NOTE — Patient Instructions (Signed)

## 2020-03-08 ENCOUNTER — Emergency Department (HOSPITAL_COMMUNITY): Payer: Medicare Other

## 2020-03-08 ENCOUNTER — Inpatient Hospital Stay (HOSPITAL_COMMUNITY)
Admission: EM | Admit: 2020-03-08 | Discharge: 2020-03-11 | DRG: 247 | Disposition: A | Discharge status: Home / Self Care | Payer: Medicare Other | Attending: Interventional Cardiology | Admitting: Interventional Cardiology

## 2020-03-08 ENCOUNTER — Other Ambulatory Visit: Payer: Self-pay

## 2020-03-08 DIAGNOSIS — K219 Gastro-esophageal reflux disease without esophagitis: Secondary | ICD-10-CM | POA: Diagnosis not present

## 2020-03-08 DIAGNOSIS — R079 Chest pain, unspecified: Secondary | ICD-10-CM | POA: Diagnosis present

## 2020-03-08 DIAGNOSIS — I25110 Atherosclerotic heart disease of native coronary artery with unstable angina pectoris: Secondary | ICD-10-CM

## 2020-03-08 DIAGNOSIS — I200 Unstable angina: Secondary | ICD-10-CM | POA: Diagnosis not present

## 2020-03-08 DIAGNOSIS — I214 Non-ST elevation (NSTEMI) myocardial infarction: Principal | ICD-10-CM

## 2020-03-08 DIAGNOSIS — G4700 Insomnia, unspecified: Secondary | ICD-10-CM | POA: Diagnosis not present

## 2020-03-08 DIAGNOSIS — I2510 Atherosclerotic heart disease of native coronary artery without angina pectoris: Secondary | ICD-10-CM | POA: Diagnosis present

## 2020-03-08 DIAGNOSIS — I10 Essential (primary) hypertension: Secondary | ICD-10-CM | POA: Diagnosis present

## 2020-03-08 DIAGNOSIS — I499 Cardiac arrhythmia, unspecified: Secondary | ICD-10-CM | POA: Diagnosis not present

## 2020-03-08 DIAGNOSIS — H409 Unspecified glaucoma: Secondary | ICD-10-CM | POA: Diagnosis not present

## 2020-03-08 DIAGNOSIS — I480 Paroxysmal atrial fibrillation: Secondary | ICD-10-CM | POA: Diagnosis not present

## 2020-03-08 DIAGNOSIS — Z8249 Family history of ischemic heart disease and other diseases of the circulatory system: Secondary | ICD-10-CM | POA: Diagnosis not present

## 2020-03-08 DIAGNOSIS — E119 Type 2 diabetes mellitus without complications: Secondary | ICD-10-CM | POA: Diagnosis present

## 2020-03-08 DIAGNOSIS — Z7984 Long term (current) use of oral hypoglycemic drugs: Secondary | ICD-10-CM | POA: Diagnosis not present

## 2020-03-08 DIAGNOSIS — Z79899 Other long term (current) drug therapy: Secondary | ICD-10-CM

## 2020-03-08 DIAGNOSIS — Z955 Presence of coronary angioplasty implant and graft: Secondary | ICD-10-CM

## 2020-03-08 DIAGNOSIS — E039 Hypothyroidism, unspecified: Secondary | ICD-10-CM | POA: Diagnosis not present

## 2020-03-08 DIAGNOSIS — Z79890 Hormone replacement therapy (postmenopausal): Secondary | ICD-10-CM | POA: Diagnosis not present

## 2020-03-08 DIAGNOSIS — Z23 Encounter for immunization: Secondary | ICD-10-CM

## 2020-03-08 DIAGNOSIS — E559 Vitamin D deficiency, unspecified: Secondary | ICD-10-CM | POA: Diagnosis present

## 2020-03-08 DIAGNOSIS — Z20822 Contact with and (suspected) exposure to covid-19: Secondary | ICD-10-CM | POA: Diagnosis not present

## 2020-03-08 DIAGNOSIS — Z7901 Long term (current) use of anticoagulants: Secondary | ICD-10-CM | POA: Diagnosis not present

## 2020-03-08 DIAGNOSIS — E1165 Type 2 diabetes mellitus with hyperglycemia: Secondary | ICD-10-CM | POA: Diagnosis not present

## 2020-03-08 DIAGNOSIS — E785 Hyperlipidemia, unspecified: Secondary | ICD-10-CM | POA: Diagnosis not present

## 2020-03-08 DIAGNOSIS — E1169 Type 2 diabetes mellitus with other specified complication: Secondary | ICD-10-CM | POA: Diagnosis present

## 2020-03-08 DIAGNOSIS — E782 Mixed hyperlipidemia: Secondary | ICD-10-CM | POA: Diagnosis not present

## 2020-03-08 DIAGNOSIS — M1990 Unspecified osteoarthritis, unspecified site: Secondary | ICD-10-CM | POA: Diagnosis present

## 2020-03-08 DIAGNOSIS — Z743 Need for continuous supervision: Secondary | ICD-10-CM | POA: Diagnosis not present

## 2020-03-08 DIAGNOSIS — I4891 Unspecified atrial fibrillation: Secondary | ICD-10-CM | POA: Diagnosis not present

## 2020-03-08 DIAGNOSIS — E1159 Type 2 diabetes mellitus with other circulatory complications: Secondary | ICD-10-CM | POA: Diagnosis present

## 2020-03-08 DIAGNOSIS — I152 Hypertension secondary to endocrine disorders: Secondary | ICD-10-CM | POA: Diagnosis present

## 2020-03-08 DIAGNOSIS — R6889 Other general symptoms and signs: Secondary | ICD-10-CM | POA: Diagnosis not present

## 2020-03-08 DIAGNOSIS — I517 Cardiomegaly: Secondary | ICD-10-CM | POA: Diagnosis not present

## 2020-03-08 DIAGNOSIS — R0789 Other chest pain: Secondary | ICD-10-CM

## 2020-03-08 DIAGNOSIS — E7800 Pure hypercholesterolemia, unspecified: Secondary | ICD-10-CM | POA: Diagnosis not present

## 2020-03-08 LAB — COMPREHENSIVE METABOLIC PANEL
ALT: 16 U/L (ref 0–44)
AST: 20 U/L (ref 15–41)
Albumin: 4.3 g/dL (ref 3.5–5.0)
Alkaline Phosphatase: 67 U/L (ref 38–126)
Anion gap: 9 (ref 5–15)
BUN: 13 mg/dL (ref 8–23)
CO2: 27 mmol/L (ref 22–32)
Calcium: 9.3 mg/dL (ref 8.9–10.3)
Chloride: 100 mmol/L (ref 98–111)
Creatinine, Ser: 0.96 mg/dL (ref 0.44–1.00)
GFR, Estimated: >60 mL/min (ref >60)
Glucose, Bld: 241 mg/dL — ABNORMAL HIGH (ref 70–99)
Potassium: 3.9 mmol/L (ref 3.5–5.1)
Sodium: 136 mmol/L (ref 135–145)
Total Bilirubin: 0.4 mg/dL (ref 0.3–1.2)
Total Protein: 7.5 g/dL (ref 6.5–8.1)

## 2020-03-08 LAB — CBC WITH DIFFERENTIAL/PLATELET
Abs Immature Granulocytes: 0.03 10*3/uL (ref 0.00–0.07)
Basophils Absolute: 0 10*3/uL (ref 0.0–0.1)
Basophils Relative: 0 %
Eosinophils Absolute: 0.3 10*3/uL (ref 0.0–0.5)
Eosinophils Relative: 3 %
HCT: 38.5 % (ref 36.0–46.0)
Hemoglobin: 12.5 g/dL (ref 12.0–15.0)
Immature Granulocytes: 0 %
Lymphocytes Relative: 23 %
Lymphs Abs: 1.8 10*3/uL (ref 0.7–4.0)
MCH: 30 pg (ref 26.0–34.0)
MCHC: 32.5 g/dL (ref 30.0–36.0)
MCV: 92.5 fL (ref 80.0–100.0)
Monocytes Absolute: 0.6 10*3/uL (ref 0.1–1.0)
Monocytes Relative: 7 %
Neutro Abs: 5.3 10*3/uL (ref 1.7–7.7)
Neutrophils Relative %: 67 %
Platelets: 214 10*3/uL (ref 150–400)
RBC: 4.16 MIL/uL (ref 3.87–5.11)
RDW: 12.4 % (ref 11.5–15.5)
WBC: 8 10*3/uL (ref 4.0–10.5)
nRBC: 0 % (ref 0.0–0.2)

## 2020-03-08 LAB — CBC
HCT: 37.4 % (ref 36.0–46.0)
Hemoglobin: 12.7 g/dL (ref 12.0–15.0)
MCH: 30.5 pg (ref 26.0–34.0)
MCHC: 34 g/dL (ref 30.0–36.0)
MCV: 89.9 fL (ref 80.0–100.0)
Platelets: 172 10*3/uL (ref 150–400)
RBC: 4.16 MIL/uL (ref 3.87–5.11)
RDW: 12.1 % (ref 11.5–15.5)
WBC: 8.2 10*3/uL (ref 4.0–10.5)
nRBC: 0 % (ref 0.0–0.2)

## 2020-03-08 LAB — TROPONIN I (HIGH SENSITIVITY)
Troponin I (High Sensitivity): 34 ng/L — ABNORMAL HIGH (ref <18)
Troponin I (High Sensitivity): 57 ng/L — ABNORMAL HIGH (ref <18)
Troponin I (High Sensitivity): 1243 ng/L (ref <18)

## 2020-03-08 LAB — RESP PANEL BY RT-PCR (FLU A&B, COVID) ARPGX2
Influenza A by PCR: NEGATIVE
Influenza B by PCR: NEGATIVE
SARS Coronavirus 2 by RT PCR: NEGATIVE

## 2020-03-08 LAB — GLUCOSE, CAPILLARY: Glucose-Capillary: 244 mg/dL — ABNORMAL HIGH (ref 70–99)

## 2020-03-08 LAB — HEPARIN LEVEL (UNFRACTIONATED): Heparin Unfractionated: 1.9 IU/mL — ABNORMAL HIGH (ref 0.30–0.70)

## 2020-03-08 LAB — APTT
aPTT: 31 seconds (ref 24–36)
aPTT: 36 seconds (ref 24–36)
aPTT: 39 seconds — ABNORMAL HIGH (ref 24–36)

## 2020-03-08 LAB — CBG MONITORING, ED: Glucose-Capillary: 256 mg/dL — ABNORMAL HIGH (ref 70–99)

## 2020-03-08 MED ORDER — ALBUTEROL SULFATE HFA 108 (90 BASE) MCG/ACT IN AERS
1.0000 | INHALATION_SPRAY | RESPIRATORY_TRACT | Status: DC | PRN
  Filled 2020-03-08: qty 6.7

## 2020-03-08 MED ORDER — HEPARIN (PORCINE) 25000 UT/250ML-% IV SOLN
1250.0000 [IU]/h | INTRAVENOUS | Status: DC
  Administered 2020-03-08: 800 [IU]/h via INTRAVENOUS
  Filled 2020-03-08: qty 250

## 2020-03-08 MED ORDER — TIMOLOL MALEATE 0.5 % OP SOLN
1.0000 [drp] | Freq: Every day | OPHTHALMIC | Status: DC
  Administered 2020-03-08: 1 [drp] via OPHTHALMIC
  Filled 2020-03-08 (×2): qty 5

## 2020-03-08 MED ORDER — AMITRIPTYLINE HCL 10 MG PO TABS
10.0000 mg | ORAL_TABLET | Freq: Every day | ORAL | Status: DC
  Administered 2020-03-08 – 2020-03-10 (×3): 10 mg via ORAL
  Filled 2020-03-08 (×7): qty 1

## 2020-03-08 MED ORDER — NITROGLYCERIN 0.4 MG SL SUBL: 0.4000 mg | SUBLINGUAL_TABLET | SUBLINGUAL | Status: DC | PRN

## 2020-03-08 MED ORDER — ASPIRIN 81 MG PO CHEW
81.0000 mg | CHEWABLE_TABLET | ORAL | Status: AC
Start: 2020-03-09 — End: 2020-03-09
  Administered 2020-03-09: 81 mg via ORAL
  Filled 2020-03-08: qty 1

## 2020-03-08 MED ORDER — ASPIRIN EC 81 MG PO TBEC: 81.0000 mg | DELAYED_RELEASE_TABLET | Freq: Every day | ORAL | Status: DC

## 2020-03-08 MED ORDER — NITROGLYCERIN 2 % TD OINT
1.0000 [in_us] | TOPICAL_OINTMENT | Freq: Once | TRANSDERMAL | Status: AC
  Administered 2020-03-08: 1 [in_us] via TOPICAL
  Filled 2020-03-08: qty 1

## 2020-03-08 MED ORDER — INSULIN ASPART 100 UNIT/ML ~~LOC~~ SOLN
0.0000 [IU] | Freq: Three times a day (TID) | SUBCUTANEOUS | Status: DC
  Administered 2020-03-09 – 2020-03-11 (×4): 2 [IU] via SUBCUTANEOUS

## 2020-03-08 MED ORDER — PANTOPRAZOLE SODIUM 40 MG PO TBEC
40.0000 mg | DELAYED_RELEASE_TABLET | Freq: Every day | ORAL | Status: DC
  Administered 2020-03-08 – 2020-03-11 (×4): 40 mg via ORAL
  Filled 2020-03-08 (×4): qty 1

## 2020-03-08 MED ORDER — DILTIAZEM HCL ER COATED BEADS 240 MG PO CP24
240.0000 mg | ORAL_CAPSULE | Freq: Every day | ORAL | Status: DC
  Administered 2020-03-08 – 2020-03-11 (×3): 240 mg via ORAL
  Filled 2020-03-08 (×4): qty 1

## 2020-03-08 MED ORDER — SODIUM CHLORIDE 0.9 % WEIGHT BASED INFUSION
1.0000 mL/kg/h | INTRAVENOUS | Status: DC
Start: 2020-03-09 — End: 2020-03-09
  Administered 2020-03-09 (×2): 1 mL/kg/h via INTRAVENOUS

## 2020-03-08 MED ORDER — SODIUM CHLORIDE 0.9 % IV SOLN: 250.0000 mL | INTRAVENOUS | Status: DC | PRN

## 2020-03-08 MED ORDER — LOSARTAN POTASSIUM 25 MG PO TABS
25.0000 mg | ORAL_TABLET | Freq: Every day | ORAL | Status: DC
  Administered 2020-03-08 – 2020-03-10 (×2): 25 mg via ORAL
  Filled 2020-03-08 (×4): qty 1

## 2020-03-08 MED ORDER — CHLORHEXIDINE GLUCONATE CLOTH 2 % EX PADS
6.0000 | MEDICATED_PAD | Freq: Every day | CUTANEOUS | Status: DC
  Administered 2020-03-09 – 2020-03-10 (×2): 6 via TOPICAL

## 2020-03-08 MED ORDER — SODIUM CHLORIDE 0.9 % WEIGHT BASED INFUSION
3.0000 mL/kg/h | INTRAVENOUS | Status: DC
  Administered 2020-03-09: 3 mL/kg/h via INTRAVENOUS

## 2020-03-08 MED ORDER — METOPROLOL TARTRATE 100 MG PO TABS
100.0000 mg | ORAL_TABLET | Freq: Two times a day (BID) | ORAL | Status: DC
  Administered 2020-03-08 – 2020-03-11 (×6): 100 mg via ORAL
  Filled 2020-03-08: qty 2
  Filled 2020-03-08: qty 1
  Filled 2020-03-08 (×5): qty 2
  Filled 2020-03-08: qty 1

## 2020-03-08 MED ORDER — NITROGLYCERIN IN D5W 200-5 MCG/ML-% IV SOLN
5.0000 ug/min | INTRAVENOUS | Status: DC
  Administered 2020-03-08: 5 ug/min via INTRAVENOUS
  Filled 2020-03-08: qty 250

## 2020-03-08 MED ORDER — MORPHINE SULFATE (PF) 4 MG/ML IV SOLN
4.0000 mg | Freq: Once | INTRAVENOUS | Status: AC
  Administered 2020-03-08: 4 mg via INTRAVENOUS
  Filled 2020-03-08: qty 1

## 2020-03-08 MED ORDER — MORPHINE SULFATE (PF) 2 MG/ML IV SOLN
2.0000 mg | INTRAVENOUS | Status: DC | PRN
  Administered 2020-03-08: 2 mg via INTRAVENOUS
  Filled 2020-03-08: qty 1

## 2020-03-08 MED ORDER — LEVOTHYROXINE SODIUM 75 MCG PO TABS
75.0000 ug | ORAL_TABLET | Freq: Every day | ORAL | Status: DC
  Administered 2020-03-09 – 2020-03-11 (×3): 75 ug via ORAL
  Filled 2020-03-08 (×3): qty 1

## 2020-03-08 MED ORDER — ONDANSETRON HCL 4 MG/2ML IJ SOLN
4.0000 mg | Freq: Once | INTRAMUSCULAR | Status: AC
  Administered 2020-03-08: 4 mg via INTRAVENOUS
  Filled 2020-03-08: qty 2

## 2020-03-08 MED ORDER — SODIUM CHLORIDE 0.9% FLUSH
3.0000 mL | Freq: Two times a day (BID) | INTRAVENOUS | Status: DC
  Administered 2020-03-08 – 2020-03-09 (×2): 3 mL via INTRAVENOUS

## 2020-03-08 MED ORDER — ONDANSETRON HCL 4 MG/2ML IJ SOLN
4.0000 mg | Freq: Four times a day (QID) | INTRAMUSCULAR | Status: DC | PRN
  Administered 2020-03-09: 4 mg via INTRAVENOUS
  Filled 2020-03-08: qty 2

## 2020-03-08 MED ORDER — ACETAMINOPHEN 500 MG PO TABS: 1000.0000 mg | ORAL_TABLET | Freq: Three times a day (TID) | ORAL | Status: DC | PRN

## 2020-03-08 MED ORDER — ASPIRIN 81 MG PO CHEW: 324.0000 mg | CHEWABLE_TABLET | Freq: Once | ORAL | Status: DC

## 2020-03-08 MED ORDER — SODIUM CHLORIDE 0.9% FLUSH: 3.0000 mL | INTRAVENOUS | Status: DC | PRN

## 2020-03-08 MED ORDER — ROSUVASTATIN CALCIUM 5 MG PO TABS
5.0000 mg | ORAL_TABLET | Freq: Every day | ORAL | Status: DC
  Administered 2020-03-08 – 2020-03-10 (×3): 5 mg via ORAL
  Filled 2020-03-08 (×3): qty 1

## 2020-03-08 NOTE — ED Notes (Signed)
Attempted to call report, no answer

## 2020-03-08 NOTE — ED Notes (Signed)
Attempted to call report-no answer 

## 2020-03-08 NOTE — H&P (Signed)
History and Physical:   Patient ID: [name] MRN: [id number]; DOB: [birth year]  Admit date: 03/08/2020 Date of Consult: 03/08/2020  Primary Care Provider: Ivy Lynn, NP [facility] Cardiologist: Julie Lesches, MD   [facility] Electrophysiologist:  Julie Grayer, MD    Patient Profile:   [name] is a 80 y.o. female [place] with a hx of PAF (amiodarone discontinued due to hypothyroidism, continues on Eliquis), CAD status post DES to the LAD in 2010 as well as 2017, hypertension, hyperlipidemia, type 2 diabetes mellitus, and PSVT who is being seen today for the evaluation of chest pain at the request of [name].  History of Present Illness:   [name] presents to the [facility] ER reporting recurrent chest pain over the last week, much worse in the early morning hours when she awoke around 4 AM. She describes upper chest and shoulder discomfort that has been nitroglycerin responsive over the last week. She states that she has been compliant with her regular medications.  She had a video encounter with [name] 02/02/20 for symptomatic recurrent AF, also reporting DOE. She had previously been taken off amiodarone due to hypothyroidism and other treatment options are being considered. He recommended ILR and also follow-up with general cardiology.  Patient has been placed on IV nitroglycerin and IV heparin, symptoms improving but still with residual chest discomfort. She is otherwise hemodynamically stable, in fact hypertensive. ECG shows no acute ST segment changes. Initial high-sensitivity troponin I level is 35. Stable, mild cardiomegaly with no acute process.    Past Medical History:  Diagnosis Date  . Anxiety   . Arthritis   . Bulging lumbar disc   . Chronic lower back pain   . Coronary atherosclerosis of native coronary artery    DES LAD 02/2008, DES LAD 02/2015  . Depression   . Dyslipidemia   . Essential hypertension   . Facial numbness    . Frequent headaches   . GERD (gastroesophageal reflux disease)   . Glaucoma   . Insomnia   . Paroxysmal atrial fibrillation [facility])    Diagnosed October 2019  . PSVT (paroxysmal supraventricular tachycardia) ([place])   . Refusal of blood transfusions as patient is Jehovah's Witness   . Thyroid disease   . Type 2 diabetes mellitus (HCC)    Borderline  . Vitamin D deficiency     Past Surgical History:  Procedure Laterality Date  . CARDIAC CATHETERIZATION N/A 02/23/2015   Procedure: Left Heart Cath and Coronary Angiography;  Surgeon: Sherren Mocha, MD;  Location: [place] CV LAB;  Service: Cardiovascular;  Laterality: N/A;  . CARDIAC CATHETERIZATION N/A 02/23/2015   Procedure: Coronary Stent Intervention;  Surgeon: Sherren Mocha, MD;  Location: [place] CV LAB;  Service: Cardiovascular;  Laterality: N/A;  . CARDIAC CATHETERIZATION N/A 01/04/2016   Procedure: Left Heart Cath and Coronary Angiography;  Surgeon: Peter M Martinique, MD;  Location: [place] CV LAB;  Service: Cardiovascular;  Laterality: N/A;  . CATARACT EXTRACTION    . CORONARY ANGIOPLASTY  02/23/2015  . CORONARY ANGIOPLASTY WITH STENT PLACEMENT  2009  . DILATION AND CURETTAGE OF UTERUS    . TUBAL LIGATION       Home Medications:  Prior to Admission medications   Medication Sig Start Date End Date Taking? Authorizing Provider  acetaminophen (TYLENOL) 650 MG CR tablet Take 1,300 mg by mouth every 8 (eight) hours as needed for pain.   Yes [provider]  amitriptyline (ELAVIL) 10 MG  tablet Take 1 tablet (10 mg total) by mouth at bedtime. 11/20/19  Yes Julie Lynn, NP  apixaban (ELIQUIS) 5 MG TABS tablet Take 1 tablet (5 mg total) by mouth 2 (two) times daily. 11/07/19  Yes Satira Sark, MD  Ascorbic Acid (VITAMIN C) 500 MG tablet Take 500 mg by mouth daily.     Yes [provider]  bimatoprost (LUMIGAN) 0.01 % SOLN Place 1 drop into both eyes at bedtime.   Yes [provider]  Calcium  Carbonate Antacid (TUMS PO) Take 2 tablets by mouth 4 (four) times daily as needed (for acid reflux/indigestion).    Yes [provider]  Cholecalciferol (VITAMIN D) 400 UNITS capsule Take 400 Units by mouth daily.     Yes [provider]  Cyanocobalamin (VITAMIN B 12 PO) Take 1 tablet by mouth daily.   Yes [provider]  diltiazem (CARDIZEM CD) 180 MG 24 hr capsule TAKE ONE (1) CAPSULE EACH DAY 10/22/19  Yes Satira Sark, MD  Garlic [id number] MG TABS Take 1 tablet by mouth daily.   Yes [provider]  HYDROcodone-acetaminophen (NORCO) 10-325 MG tablet Take 1 tablet by mouth 4 (four) times daily as needed. For pain. 10/29/15  Yes [provider]  ibuprofen (ADVIL,MOTRIN) 200 MG tablet Take 600 mg by mouth every 6 (six) hours as needed (for pain.).    Yes [provider]  levothyroxine (SYNTHROID) 75 MCG tablet Take 1 tablet (75 mcg total) by mouth daily before breakfast. 03/05/20  Yes Julie Romp, NP  losartan (COZAAR) 25 MG tablet TAKE ONE (1) TABLET EACH DAY 12/22/19  Yes Satira Sark, MD  MAGNESIUM CARBONATE PO Take 1 tablet by mouth daily. 400 mg   Yes [provider]  Menthol, Topical Analgesic, 4 % GEL Apply 1 application topically 4 (four) times daily as needed (for pain).    Yes [provider]  metoprolol tartrate (LOPRESSOR) 100 MG tablet Take 1 tablet (100 mg total) by mouth 2 (two) times daily. 03/14/19  Yes Satira Sark, MD  nitroGLYCERIN (NITROSTAT) 0.4 MG SL tablet Place 0.4 mg under the tongue every 5 (five) minutes as needed for chest pain.   Yes [provider]  pantoprazole (PROTONIX) 40 MG tablet TAKE ONE (1) TABLET EACH DAY 10/24/17  Yes Satira Sark, MD  promethazine (PHENERGAN) 25 MG tablet 1 tablet as needed. 08/08/18  Yes [provider]  rosuvastatin (CRESTOR) 5 MG tablet TAKE ONE (1) TABLET EACH DAY 02/06/20  Yes Satira Sark, MD  timolol (TIMOPTIC) 0.5 %  ophthalmic solution Place 1 drop into both eyes daily.   Yes [provider]  traZODone (DESYREL) 100 MG tablet Take 1 tablet by mouth at bedtime.  11/21/18  Yes [provider]  TURMERIC PO Take 1 tablet by mouth daily.   Yes [provider]  VENTOLIN HFA 108 (90 Base) MCG/ACT inhaler Inhale 1-2 puffs into the lungs every 4 (four) hours as needed for shortness of breath. 12/21/15  Yes [provider]  diclofenac Sodium (VOLTAREN) 1 % GEL Apply 2 g topically 4 (four) times daily. Patient taking differently: Apply 2 g topically 4 (four) times daily. As needed 01/09/20   Julie Lynn, NP  metFORMIN (GLUCOPHAGE-XR) 500 MG 24 hr tablet Take 1 tablet by mouth daily.  11/22/18   [provider]    Inpatient Medications: Scheduled Meds: . amitriptyline  10 mg Oral QHS  . diltiazem  240 mg  Oral Daily  . [START ON 03/09/2020] levothyroxine  75 mcg Oral QAC breakfast  . losartan  25 mg Oral Daily  . metoprolol tartrate  100 mg Oral BID  . pantoprazole  40 mg Oral Daily  . rosuvastatin  5 mg Oral Daily  . timolol  1 drop Both Eyes Daily   Continuous Infusions: . heparin 800 Units/hr (03/08/20 1237)  . nitroGLYCERIN 7.5 mcg/min (03/08/20 1331)    Allergies:   No Known Allergies  Social History:   Social History   Tobacco Use  . Smoking status: Never Smoker  . Smokeless tobacco: Never Used  Substance Use Topics  . Alcohol use: Yes    Alcohol/week: 1.0 standard drink    Types: 1 Glasses of wine per week    Comment: RARE OCCASION    Family History:     Family History  Problem Relation Age of Onset  . Other Mother        "natural causes"  . Heart disease Mother   . Cancer Father        unsure of type     ROS:  Please see the history of present illness. All other ROS reviewed and negative.     Physical Exam/Data:   Vitals:   03/08/20 1130 03/08/20 1200 03/08/20 1230 03/08/20 1300  BP: (!) 174/75 (!) 160/74 (!) 177/73 (!) 158/139   Pulse: (!) 59 61 60 (!) 57  Resp: 16 17    Temp:      TempSrc:      SpO2: 96% 94% 96% 96%  Weight:      Height:       No intake or output data in the 24 hours ending 03/08/20 1341 Last 3 Weights 03/08/2020 03/05/2020 02/06/2020  Weight (lbs) 185 lb 3 oz 184 lb 176 lb  Weight (kg) 84 kg 83.462 kg 79.833 kg     Body mass index is 34.99 kg/m.  HEENT: Conjunctiva and lids normal, oropharynx clear. Neck: Supple, no elevated JVP or carotid bruits, no thyromegaly. Lungs: Clear to auscultation, nonlabored breathing at rest. Cardiac: Regular rate and rhythm, no S3, soft systolic murmur, no pericardial rub. Abdomen: Soft, nontender, bowel sounds present. Extremities: No pitting edema, distal pulses 2+. Skin: Warm and dry. Musculoskeletal: No kyphosis. Neuropsychiatric: Alert and oriented x3, affect grossly appropriate.  EKG:  The EKG was personally reviewed and demonstrates: Sinus rhythm with PACs, prolonged QTC, no acute ST segment changes.  Telemetry:  Telemetry was personally reviewed and demonstrates: Sinus rhythm with PACs.  Relevant CV Studies:  2 week monitor 12/2019 Study Highlights  ZIO AT reviewed.  13 days 13 hours analyzed.  Predominant rhythm is sinus with heart rate ranging from 44 bpm up to 77 bpm and average heart rate 55 bpm.  Prolonged PR interval noted.  There were 7 episodes of SVT, the longest of which was only 9 beats.  Otherwise there were occasional PACs representing 1.8% total beats and rare PVCs representing less than 1% total beats.  No sustained arrhythmias or pauses.  Echo 12/11/19 IMPRESSIONS    1. Left ventricular ejection fraction, by estimation, is 65 to 70%. The  left ventricle has normal function. The left ventricle has no regional  wall motion abnormalities. There is mild left ventricular hypertrophy.  Left ventricular diastolic parameters  are consistent with Grade II diastolic dysfunction (pseudonormalization).  Elevated left atrial pressure.    2. Right ventricular systolic function is normal. The right ventricular  size is normal. There is mildly elevated  pulmonary artery systolic  pressure.   3. The mitral valve is normal in structure. Mild mitral valve  regurgitation. No evidence of mitral stenosis. Moderate mitral annular  calcification.   4. The aortic valve has an indeterminant number of cusps. There is mild  calcification of the aortic valve. There is mild thickening of the aortic  valve. Aortic valve regurgitation is mild. No aortic stenosis is present.   5. Mild pulmonary HTN, PASP is 36 mmHg.   6. The inferior vena cava is normal in size with greater than 50%  respiratory variability, suggesting right atrial pressure of 3 mmHg.   Lexiscan Myoview 08/29/2018:  The left ventricular ejection fraction is normal (55-65%).  Nuclear stress EF: 62%.  There was no ST segment deviation noted during stress.  The study is normal.  This is a low risk study.   Normal stress nuclear study with no ischemia or infarction.  Gated ejection fraction 62% with normal wall motion.   Echocardiogram 12/18/2017: Study Conclusions   - Left ventricle: The cavity size was normal. Wall thickness was   increased in a pattern of moderate LVH. Systolic function was   vigorous. The estimated ejection fraction was in the range of 65%   to 70%. Wall motion was normal; there were no regional wall   motion abnormalities. The study is not technically sufficient to   allow evaluation of LV diastolic function. - Aortic valve: Mildly calcified annulus. Trileaflet; mildly   thickened leaflets. Valve area (VTI): 3.26 cm^2. Valve area   (Vmax): 3.2 cm^2. - Mitral valve: Mildly calcified annulus. Mildly thickened leaflets. - Left atrium: The atrium was mildly dilated. - Atrial septum: No defect or patent foramen ovale was identified. - Pulmonary arteries: Systolic pressure was mildly increased. PA   peak pressure: 31 mm Hg (S).   Cath 2017  The  left ventricular systolic function is normal.  LV end diastolic pressure is moderately elevated.  The left ventricular ejection fraction is 55-65% by visual estimate.  Prox LAD-2 lesion, 0 %stenosed.  A drug eluting .  Prox LAD-1 lesion, 0 %stenosed.  A drug eluting .  [place] lesion, 30 %stenosed.  Prox Cx lesion, 30 %stenosed.  Mid RCA lesion, 25 %stenosed.  Mid LAD lesion, 40 %stenosed.   1. Nonobstructive CAD. The stents in the LAD are patent 2. Normal LV function 3. Elevated LVEDP   Plan: continue medical therapy. Consider noncardiac causes of chest pain.  Laboratory Data:  High Sensitivity Troponin:   Recent Labs  Lab 03/08/20 1112  TROPONINIHS 34*     Chemistry Recent Labs  Lab 03/08/20 1112  NA 136  K 3.9  CL 100  CO2 27  GLUCOSE 241*  BUN 13  CREATININE 0.96  CALCIUM 9.3  GFRNONAA >60  ANIONGAP 9    Recent Labs  Lab 03/08/20 1112  PROT 7.5  ALBUMIN 4.3  AST 20  ALT 16  ALKPHOS 67  BILITOT 0.4   Hematology Recent Labs  Lab 03/08/20 1112  WBC 8.0  RBC 4.16  HGB 12.5  HCT 38.5  MCV 92.5  MCH 30.0  MCHC 32.5  RDW 12.4  PLT 214    Radiology/Studies:  DG Chest Port 1 View  Result Date: 03/08/2020 CLINICAL DATA:  Left-sided shoulder pain beginning this morning. Atrial fibrillation. Diabetes. EXAM: PORTABLE CHEST 1 VIEW COMPARISON:  11/11/2018 FINDINGS: Stable mild cardiomegaly. Both lungs are clear. No evidence of pleural effusion. IMPRESSION: Stable mild cardiomegaly. No active lung disease. Electronically Signed  By: Marlaine Hind M.D.   On: 03/08/2020 11:24    Assessment and Plan:   1. Unstable angina. Initial high-sensitivity troponin I level 35, ECG without acute ST segment changes. Symptoms have been waxing and waning for the last week, worse last night starting around 4 AM.  2. CAD status post DES to the LAD in 2010 and DES to the LAD in 2017. Last ischemic testing was in July 2020 with low risk Myoview. She has had recent  dyspnea on exertion as well. Not on aspirin as an outpatient given use of Eliquis. She otherwise has continued on Lopressor, Cardizem CD, losartan, and Crestor.  3. Paroxysmal atrial fibrillation with CHA2DS2-VASc score of 6. She is in sinus rhythm with PACs today. Currently on Eliquis along with Cardizem CD and Toprol-XL. She has been taken off amiodarone previously due to hypothyroidism. Follows in the atrial fibrillation clinic and with [name]. Next step was for ILR to investigate rhythm burden and guide further treatment options, potentially even ablation. Last dose of Eliquis was yesterday evening.  4. Mixed hyperlipidemia, prior intolerance to Zocor, most recently placed on Crestor with LDL 180. Needs repeat FLP.  5. Essential hypertension, blood pressure is elevated currently.  Discussed with [name]. Plan will be transfer and admission to our cardiology service at [facility]. I spoke with our [place] who will contact [facility] to make arrangements on that end. Continue IV heparin and nitroglycerin, start aspirin and hold Eliquis. Continue remaining regimen including Cardizem CD, losartan, Toprol-XL, and Crestor. Needs follow-up FLP. Anticipate diagnostic cardiac catheterization with eye toward revascularization, most likely tomorrow after 24 hours off Eliquis, unless symptoms do not [place] down. Admitting to ICU.  For questions or updates, please contact [place] Please consult [email or web address] for contact info under    Signed, Julie Lesches, MD  03/08/2020 1:41 PM

## 2020-03-08 NOTE — Progress Notes (Signed)
Paged Cardiology  Patient arrived on Nitro @ 22mcg and Heparin 0800.VSS  Patient A&Ox4 able to verbarlize all needs.  Patient oriented to room, call bell, bed locked in low position and instructed to call for an new chest pain.

## 2020-03-08 NOTE — Progress Notes (Signed)
 -  Called by EDP to admit patient for chest pain/ACS  -- I came to the ED and spoke with patient in the ED,  -- I was notified by cardiologist Dr. Domenic Polite with that cardiology service will be taking over patient's care  -- See full H&P from Dr. Domenic Polite dated 03/08/2020  -- Further management for ACS by cardiology service  Roxan Hockey, MD

## 2020-03-08 NOTE — Progress Notes (Addendum)
Received notification patient arrived to [facility]. Had received sign-out from Dr. Domenic Polite that patient was coming for USA/NSTEMI and was still having some residual chest pain prior to transfer. Per our discussion the plan was to try and cool her off with antianginals overnight and cath tomorrow. Upon arrival per d/w nurse patient is hemodynamically stable. She does still note some dull residual chest discomfort. This is rated 5/10. Most recent EKG reviewed from 1430 showed NSR 80bpm with subtle ST depression I, II, avL, TWI III, avF, V5-V6 which appeared different than tracing earlier this AM. We were having some issues with her chart crossing over into Epic. F/u tracing here at our facility shows similar nonspecific STTW changes. Most recent hsTroponin has uptrended further to 1243, consistent with clinical picture. NTG drip titrated further upon arrival with mild relief in discomfort, still around 5/10, but nothing like the severity that brought her into the ED. She appears comfortable in no acute distress. Tele stable and most recent BP 133/70. D/w Dr. Harl Bowie - will continue to titrate nitroglycerin drip this evening and also offer morphine. Will continue to cycle troponins. Will sign out patient to on-call fellow to re-evaluate this evening to ensure progressing towards pain free. Nurse also kept informed of plan.  Adham Johnson PA-C

## 2020-03-08 NOTE — Progress Notes (Signed)
Asked Charge nurse to assist in transfering patient to [facility] facility in [place].  Paged Cardiology again Paitent is having 5/10 dull chest ache

## 2020-03-08 NOTE — Progress Notes (Signed)
ANTICOAGULATION CONSULT NOTE - Initial Consult  Pharmacy Consult for Heparin Indication: chest pain/ACS  No Known Allergies  Patient Measurements: Height: 5\' 1"  (154.9 cm) Weight: 84 kg (185 lb 3 oz) IBW/kg (Calculated) : 47.8 HEPARIN DW (KG): 67  Vital Signs: Temp: 98.2 F (36.8 C) (01/17 1034) Temp Source: Oral (01/17 1034) BP: 174/75 (01/17 1130) Pulse Rate: 59 (01/17 1130)  Labs: Recent Labs    03/08/20 1112  HGB 12.5  HCT 38.5  PLT 214  CREATININE 0.96  TROPONINIHS 34*    Estimated Creatinine Clearance: 46.7 mL/min (by C-G formula based on SCr of 0.96 mg/dL).   Medical History: Past Medical History:  Diagnosis Date  . Anxiety   . Arthritis   . Bulging lumbar disc   . Chronic lower back pain   . Coronary atherosclerosis of native coronary artery    DES LAD 02/2008, DES LAD 02/2015  . Depression   . Dyslipidemia   . Essential hypertension   . Facial numbness   . Frequent headaches   . GERD (gastroesophageal reflux disease)   . Glaucoma   . Insomnia   . Paroxysmal atrial fibrillation [facility])    Diagnosed October 2019  . PSVT (paroxysmal supraventricular tachycardia) ([place])   . Refusal of blood transfusions as patient is Jehovah's Witness   . Thyroid disease   . Type 2 diabetes mellitus (HCC)    Borderline  . Vitamin D deficiency     Medications:  See med rec  Assessment: Patient presented to the ED with chest pain and shoulder pain. Patient is chronically anticoagulated with eliquis and last dose was 03/07/2020 @ 1900. Pharmacy asked to start heparin. Will start heparin without bolus and monitor APTT and anti-Xa until correlate. Baseline APTT and anti-Xa level ordered  Goal of Therapy:  Heparin level 0.3-0.7 units/ml aPTT 66-102 seconds Monitor platelets by anticoagulation protocol: Yes   Plan:  Start heparin infusion at 800 units/hr Check APTT and anti-Xa level in ~8 hours and daily while on heparin Continue to monitor H&H and  platelets  Isac Sarna, BS [place], BCPS Clinical Pharmacist Pager [phone] 03/08/2020,12:06 PM

## 2020-03-08 NOTE — Progress Notes (Signed)
ANTICOAGULATION CONSULT NOTE - Initial Consult  Pharmacy Consult for Heparin Indication: chest pain/ACS  No Known Allergies  Patient Measurements: Height: 5\' 1"  (154.9 cm) Weight: 82.6 kg (182 lb 1.6 oz) IBW/kg (Calculated) : 47.8 HEPARIN DW (KG): 66.6  Vital Signs: Temp: 97.3 F (36.3 C) (01/17 2000) Temp Source: Oral (01/17 2000) BP: 147/96 (01/17 2203) Pulse Rate: 72 (01/17 2203)  Labs: Recent Labs    03/08/20 1112 03/08/20 1316 03/08/20 1610 03/08/20 2159  HGB 12.5  --  12.7  --   HCT 38.5  --  37.4  --   PLT 214  --  172  --   APTT  --  31  --  39*  HEPARINUNFRC  --  1.90*  --   --   CREATININE 0.96  --   --   --   TROPONINIHS 34* 57* 1,243*  --     Estimated Creatinine Clearance: 46.3 mL/min (by C-G formula based on SCr of 0.96 mg/dL).   Medical History: Past Medical History:  Diagnosis Date  . Anxiety   . Arthritis   . Bulging lumbar disc   . Chronic lower back pain   . Coronary atherosclerosis of native coronary artery    DES LAD 02/2008, DES LAD 02/2015  . Depression   . Dyslipidemia   . Essential hypertension   . Facial numbness   . Frequent headaches   . GERD (gastroesophageal reflux disease)   . Glaucoma   . Insomnia   . Paroxysmal atrial fibrillation [facility])    Diagnosed October 2019  . PSVT (paroxysmal supraventricular tachycardia) ([place])   . Refusal of blood transfusions as patient is Jehovah's Witness   . Thyroid disease   . Type 2 diabetes mellitus (HCC)    Borderline  . Vitamin D deficiency     Medications:  See med rec  Assessment: Patient presented to the ED with chest pain and shoulder pain. Patient is chronically anticoagulated with eliquis and last dose was 03/07/2020 @ 1900. Pharmacy asked to start heparin. Will start heparin without bolus and monitor APTT and anti-Xa until correlate. Baseline APTT and anti-Xa level ordered  APTT drawn at about 9 hours is 39 secs which is subtherapeutic.  Goal of Therapy:  Heparin level  0.3-0.7 units/ml aPTT 66-102 seconds Monitor platelets by anticoagulation protocol: Yes   Plan:  Increase heparin infusion to 1050 units/hr Check APTT and anti-Xa level in ~8 hours and daily while on heparin Continue to monitor H&H and platelets  Alanda Slim, PharmD, [facility] Clinical Pharmacist Please see AMION for all Pharmacists' Contact Phone Numbers 03/08/2020, 10:37 PM

## 2020-03-08 NOTE — ED Provider Notes (Signed)
[facility] Provider Note   CSN: [id number] Arrival date & time: 03/08/20  1021     History Chief Complaint  Patient presents with  . Chest Pain    [name] is a 80 y.o. female.  Patient complains of left-sided chest pain.  She has a history of stents.  She woke up at 4 AM with the pain  The history is provided by the patient and medical records. No language interpreter was used.  Chest Pain Pain location:  L chest Pain radiates to:  Does not radiate Pain severity:  Moderate Onset quality:  Sudden Timing:  Constant Progression:  Worsening Chronicity:  Recurrent Context: not breathing   Associated symptoms: no abdominal pain, no back pain, no cough, no fatigue and no headache        Past Medical History:  Diagnosis Date  . Anxiety   . Arthritis   . Bulging lumbar disc   . Chronic lower back pain   . Coronary atherosclerosis of native coronary artery    DES LAD 02/2008, DES LAD 02/2015  . Depression   . Dyslipidemia   . Essential hypertension   . Facial numbness   . Frequent headaches   . GERD (gastroesophageal reflux disease)   . Glaucoma   . Insomnia   . Paroxysmal atrial fibrillation [facility])    Diagnosed October 2019  . PSVT (paroxysmal supraventricular tachycardia) ([place])   . Refusal of blood transfusions as patient is Jehovah's Witness   . Thyroid disease   . Type 2 diabetes mellitus (HCC)    Borderline  . Vitamin D deficiency     Patient Active Problem List   Diagnosis Date Noted  . Hypothyroidism 02/06/2020  . Paroxysmal atrial fibrillation ([place]) 12/16/2019  . Secondary hypercoagulable state ([place]) 12/16/2019  . 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 Annual physical exam 10/02/2019  . Anxiety with depression 10/02/2019  . Numbness 04/02/2018  . Numbness of tongue 04/02/2018  . Neck pain on right side 04/02/2018  . Type 2 diabetes mellitus without complication ([place]) 32/67/1245  . Atrial fibrillation with rapid ventricular  response ([place])   . Chest pain in adult   . Atrial fibrillation with RVR ([place]) 12/17/2017  . Accelerating angina ([place])   . Exertional angina (HCC) 02/23/2015  . Chest pain 11/01/2012  . Mixed hyperlipidemia 11/12/2009  . Essential hypertension 11/12/2009  . Coronary atherosclerosis of native coronary artery 09/25/2008  . Pain of back and left lower extremity 02/19/2008    Past Surgical History:  Procedure Laterality Date  . CARDIAC CATHETERIZATION N/A 02/23/2015   Procedure: Left Heart Cath and Coronary Angiography;  Surgeon: Sherren Mocha, MD;  Location: [place] [facility];  Service: Cardiovascular;  Laterality: N/A;  . CARDIAC CATHETERIZATION N/A 02/23/2015   Procedure: Coronary Stent Intervention;  Surgeon: Sherren Mocha, MD;  Location: [place] [facility];  Service: Cardiovascular;  Laterality: N/A;  . CARDIAC CATHETERIZATION N/A 01/04/2016   Procedure: Left Heart Cath and Coronary Angiography;  Surgeon: Peter M Martinique, MD;  Location: [place] [facility];  Service: Cardiovascular;  Laterality: N/A;  . CATARACT EXTRACTION    . CORONARY ANGIOPLASTY  02/23/2015  . CORONARY ANGIOPLASTY WITH STENT PLACEMENT  2009  . DILATION AND CURETTAGE OF UTERUS    . TUBAL LIGATION       OB History   No obstetric history on file.     Family History  Problem Relation Age of Onset  . Other Mother        "  natural causes"  . Heart disease Mother   . Cancer Father        unsure of type    Social History   Tobacco Use  . Smoking status: Never Smoker  . Smokeless tobacco: Never Used  Vaping Use  . Vaping Use: Never used  Substance Use Topics  . Alcohol use: Yes    Alcohol/week: 1.0 standard drink    Types: 1 Glasses of wine per week    Comment: RARE OCCASION  . Drug use: No    Home Medications Prior to Admission medications   Medication Sig Start Date End Date Taking? Authorizing Provider  acetaminophen (TYLENOL) 650 MG CR tablet Take 1,300 mg by mouth every 8 (eight) hours as needed  for pain.   Yes [provider]  amitriptyline (ELAVIL) 10 MG tablet Take 1 tablet (10 mg total) by mouth at bedtime. 11/20/19  Yes Ivy Lynn, NP  apixaban (ELIQUIS) 5 MG TABS tablet Take 1 tablet (5 mg total) by mouth 2 (two) times daily. 11/07/19  Yes Satira Sark, MD  Ascorbic Acid (VITAMIN C) 500 MG tablet Take 500 mg by mouth daily.     Yes [provider]  bimatoprost (LUMIGAN) 0.01 % SOLN Place 1 drop into both eyes at bedtime.   Yes [provider]  Calcium Carbonate Antacid (TUMS PO) Take 2 tablets by mouth 4 (four) times daily as needed (for acid reflux/indigestion).    Yes [provider]  Cholecalciferol (VITAMIN D) 400 UNITS capsule Take 400 Units by mouth daily.     Yes [provider]  Cyanocobalamin (VITAMIN B 12 PO) Take 1 tablet by mouth daily.   Yes [provider]  diltiazem (CARDIZEM CD) 180 MG 24 hr capsule TAKE ONE (1) CAPSULE EACH DAY 10/22/19  Yes Satira Sark, MD  Garlic [id number] MG TABS Take 1 tablet by mouth daily.   Yes [provider]  HYDROcodone-acetaminophen (NORCO) 10-325 MG tablet Take 1 tablet by mouth 4 (four) times daily as needed. For pain. 10/29/15  Yes [provider]  ibuprofen (ADVIL,MOTRIN) 200 MG tablet Take 600 mg by mouth every 6 (six) hours as needed (for pain.).    Yes [provider]  levothyroxine (SYNTHROID) 75 MCG tablet Take 1 tablet (75 mcg total) by mouth daily before breakfast. 03/05/20  Yes Brita Romp, NP  losartan (COZAAR) 25 MG tablet TAKE ONE (1) TABLET EACH DAY 12/22/19  Yes Satira Sark, MD  MAGNESIUM CARBONATE PO Take 1 tablet by mouth daily. 400 mg   Yes [provider]  Menthol, Topical Analgesic, 4 % GEL Apply 1 application topically 4 (four) times daily as needed (for pain).    Yes [provider]  metoprolol tartrate (LOPRESSOR) 100 MG tablet Take 1 tablet (100 mg total) by mouth 2 (two) times daily. 03/14/19  Yes  Satira Sark, MD  nitroGLYCERIN (NITROSTAT) 0.4 MG SL tablet Place 0.4 mg under the tongue every 5 (five) minutes as needed for chest pain.   Yes [provider]  pantoprazole (PROTONIX) 40 MG tablet TAKE ONE (1) TABLET EACH DAY 10/24/17  Yes Satira Sark, MD  promethazine (PHENERGAN) 25 MG tablet 1 tablet as needed. 08/08/18  Yes [provider]  rosuvastatin (CRESTOR) 5 MG tablet TAKE ONE (1) TABLET EACH DAY 02/06/20  Yes Satira Sark, MD  timolol (TIMOPTIC) 0.5 % ophthalmic solution Place 1 drop into both eyes daily.   Yes [provider]  traZODone (DESYREL) 100 MG tablet Take 1 tablet by mouth at bedtime.  11/21/18  Yes [provider]  TURMERIC PO Take 1 tablet by mouth daily.   Yes [provider]  VENTOLIN HFA 108 (90 Base) MCG/ACT inhaler Inhale 1-2 puffs into the lungs every 4 (four) hours as needed for shortness of breath. 12/21/15  Yes [provider]  diclofenac Sodium (VOLTAREN) 1 % GEL Apply 2 g topically 4 (four) times daily. Patient taking differently: Apply 2 g topically 4 (four) times daily. As needed 01/09/20   Ivy Lynn, NP  metFORMIN (GLUCOPHAGE-XR) 500 MG 24 hr tablet Take 1 tablet by mouth daily.  11/22/18   [provider]    Allergies    Patient has no known allergies.  Review of Systems   Review of Systems  Constitutional: Negative for appetite change and fatigue.  HENT: Negative for congestion, ear discharge and sinus pressure.   Eyes: Negative for discharge.  Respiratory: Negative for cough.   Cardiovascular: Positive for chest pain.  Gastrointestinal: Negative for abdominal pain and diarrhea.  Genitourinary: Negative for frequency and hematuria.  Musculoskeletal: Negative for back pain.  Skin: Negative for rash.  Neurological: Negative for seizures and headaches.  Psychiatric/Behavioral: Negative for hallucinations.    Physical Exam Updated Vital Signs BP (!) 158/139    Pulse (!) 57   Temp 98.2 F (36.8 C) (Oral)   Resp 17   Ht 5\' 1"  (1.549 m)   Wt 84 kg   LMP  (LMP Unknown)   SpO2 96%   BMI 34.99 kg/m   Physical Exam Nursing note reviewed.  Constitutional:      Appearance: She is well-developed.  HENT:     Head: Normocephalic.     Nose: Nose normal.  Eyes:     General: No scleral icterus.    Extraocular Movements: EOM normal.     Conjunctiva/sclera: Conjunctivae normal.  Neck:     Thyroid: No thyromegaly.  Cardiovascular:     Rate and Rhythm: Normal rate and regular rhythm.     Heart sounds: No murmur heard. No friction rub. No gallop.   Pulmonary:     Breath sounds: No stridor. No wheezing or rales.  Chest:     Chest wall: No tenderness.  Abdominal:     General: There is no distension.     Tenderness: There is no abdominal tenderness. There is no rebound.  Musculoskeletal:        General: No edema. Normal range of motion.     Cervical back: Neck supple.  Lymphadenopathy:     Cervical: No cervical adenopathy.  Skin:    Findings: No erythema or rash.  Neurological:     Mental Status: She is alert and oriented to person, place, and time.     Motor: No abnormal muscle tone.     Coordination: Coordination normal.  Psychiatric:        Mood and Affect: Mood and affect normal.        Behavior: Behavior normal.     ED Results / Procedures / Treatments   Labs (all labs ordered are listed, but only abnormal results are displayed) Labs Reviewed  COMPREHENSIVE METABOLIC PANEL - Abnormal; Notable for the following components:      Result Value   Glucose, Bld 241 (*)    All other components within normal limits  TROPONIN I (HIGH SENSITIVITY) - Abnormal; Notable for the following components:   Troponin I (High Sensitivity) 34 (*)  All other components within normal limits  RESP PANEL BY RT-PCR (FLU A&B, COVID) ARPGX2  CBC WITH DIFFERENTIAL/PLATELET  APTT  HEPARIN LEVEL (UNFRACTIONATED)  APTT  TROPONIN I (HIGH SENSITIVITY)     EKG EKG Interpretation  Date/Time:  Monday March 08 2020 10:29:21 EST Ventricular Rate:  65 PR Interval:    QRS Duration: 91 QT Interval:  490 QTC Calculation: 510 R Axis:   55 Text Interpretation: Prolonged QT interval nsr with pac Confirmed by Milton Ferguson [id number]) on 03/08/2020 11:07:11 AM Also confirmed by Milton Ferguson [id number])  on 03/08/2020 11:42:01 AM   Radiology DG Chest Port 1 View  Result Date: 03/08/2020 CLINICAL DATA:  Left-sided shoulder pain beginning this morning. Atrial fibrillation. Diabetes. EXAM: PORTABLE CHEST 1 VIEW COMPARISON:  11/11/2018 FINDINGS: Stable mild cardiomegaly. Both lungs are clear. No evidence of pleural effusion. IMPRESSION: Stable mild cardiomegaly. No active lung disease. Electronically Signed   By: Marlaine Hind M.D.   On: 03/08/2020 11:24    Procedures Procedures (including critical care time)  Medications Ordered in ED Medications  nitroGLYCERIN 50 mg in dextrose 5 % 250 mL (0.2 mg/mL) infusion (7.5 mcg/min Intravenous Rate/Dose Change 03/08/20 1331)  heparin ADULT infusion 100 units/mL (25000 units/279mL) (800 Units/hr Intravenous New Bag/Given 03/08/20 1237)  amitriptyline (ELAVIL) tablet 10 mg (has no administration in time range)  acetaminophen (TYLENOL) tablet 1,000 mg (has no administration in time range)  diltiazem (CARDIZEM CD) 24 hr capsule 240 mg (240 mg Oral Given 03/08/20 1256)  levothyroxine (SYNTHROID) tablet 75 mcg (has no administration in time range)  losartan (COZAAR) tablet 25 mg (25 mg Oral Given 03/08/20 1258)  metoprolol tartrate (LOPRESSOR) tablet 100 mg (100 mg Oral Given 03/08/20 1258)  rosuvastatin (CRESTOR) tablet 5 mg (5 mg Oral Given 03/08/20 1256)  timolol (TIMOPTIC) 0.5 % ophthalmic solution 1 drop (1 drop Both Eyes Given 03/08/20 1254)  albuterol (VENTOLIN HFA) 108 (90 Base) MCG/ACT inhaler 1-2 puff (has no administration in time range)  pantoprazole (PROTONIX) EC tablet 40 mg (40 mg Oral Given 03/08/20 1256)   ondansetron (ZOFRAN) injection 4 mg (4 mg Intravenous Given 03/08/20 1121)  morphine 4 MG/ML injection 4 mg (4 mg Intravenous Given 03/08/20 1121)  nitroGLYCERIN (NITROGLYN) 2 % ointment 1 inch (1 inch Topical Given 03/08/20 1120)    ED Course  I have reviewed the triage vital signs and the nursing notes.  Pertinent labs & imaging results that were available during my care of the patient were reviewed by me and considered in my medical decision making (see chart for details). CRITICAL CARE Performed by: Milton Ferguson Total critical care time: 45 minutes Critical care time was exclusive of separately billable procedures and treating other patients. Critical care was necessary to treat or prevent imminent or life-threatening deterioration. Critical care was time spent personally by me on the following activities: development of treatment plan with patient and/or surrogate as well as nursing, discussions with consultants, evaluation of patient's response to treatment, examination of patient, obtaining history from patient or surrogate, ordering and performing treatments and interventions, ordering and review of laboratory studies, ordering and review of radiographic studies, pulse oximetry and re-evaluation of patient's condition.   Patient with continued chest pain after morphine and nitro.  We have consulted cardiology for continuing pain with elevated troponin.  MDM Rules/Calculators/A&P                          Patient will be [facility] to [facility] for  chest pain and possibly will need intervention Final Clinical Impression(s) / ED Diagnoses Final diagnoses:  None    Rx / DC Orders ED Discharge Orders    None       Milton Ferguson, MD 03/08/20 1424

## 2020-03-08 NOTE — ED Notes (Signed)
CRITICAL VALUE ALERT  Critical Value:  Troponin 57  Date & Time Notied:  03/08/20 1428  Provider Notified: courage  Orders Received/Actions taken: md notified

## 2020-03-08 NOTE — ED Triage Notes (Signed)
Pt woke from sleep at 4am with left side chest pain and shoulder pain pt tool 1 nitro and applied pain patch to left shoulder and right shoulder (metholyn), pt alert and oriented, skin cool and clamey, BS 261 upon arrival, pt tired in triage says she stayed up all night is the reason.  EMS gave 3 nitro and 4 ASA en route.

## 2020-03-08 NOTE — Progress Notes (Addendum)
Spoke with cardiology.  Provider unable to get into patient's charge asked charge nurse to help with transfer.  Increased Nitro to 49mcg for chest pain. Provider Melina Copa Instructed to hold off on the EKG until tranfers is complete.  Provider still unable to access chart.

## 2020-03-09 ENCOUNTER — Encounter (HOSPITAL_COMMUNITY): Payer: Self-pay | Admitting: Interventional Cardiology

## 2020-03-09 ENCOUNTER — Ambulatory Visit: Payer: Medicare Other | Admitting: Nurse Practitioner

## 2020-03-09 ENCOUNTER — Encounter (HOSPITAL_COMMUNITY)
Admission: EM | Disposition: A | Discharge status: Home / Self Care | Payer: Self-pay | Source: Home / Self Care | Attending: Interventional Cardiology

## 2020-03-09 DIAGNOSIS — I480 Paroxysmal atrial fibrillation: Secondary | ICD-10-CM | POA: Diagnosis not present

## 2020-03-09 DIAGNOSIS — I214 Non-ST elevation (NSTEMI) myocardial infarction: Principal | ICD-10-CM

## 2020-03-09 DIAGNOSIS — I200 Unstable angina: Secondary | ICD-10-CM | POA: Diagnosis not present

## 2020-03-09 DIAGNOSIS — I2510 Atherosclerotic heart disease of native coronary artery without angina pectoris: Secondary | ICD-10-CM

## 2020-03-09 HISTORY — PX: LEFT HEART CATH AND CORONARY ANGIOGRAPHY: CATH118249

## 2020-03-09 HISTORY — PX: CORONARY STENT INTERVENTION: CATH118234

## 2020-03-09 LAB — POCT ACTIVATED CLOTTING TIME
Activated Clotting Time: 345 seconds
Activated Clotting Time: 404 seconds

## 2020-03-09 LAB — LIPID PANEL
Cholesterol: 124 mg/dL (ref 0–200)
HDL: 51 mg/dL (ref >40)
LDL Cholesterol: 61 mg/dL (ref 0–99)
Total CHOL/HDL Ratio: 2.4 RATIO
Triglycerides: 58 mg/dL (ref <150)
VLDL: 12 mg/dL (ref 0–40)

## 2020-03-09 LAB — CBC
HCT: 32.9 % — ABNORMAL LOW (ref 36.0–46.0)
Hemoglobin: 11.5 g/dL — ABNORMAL LOW (ref 12.0–15.0)
MCH: 31 pg (ref 26.0–34.0)
MCHC: 35 g/dL (ref 30.0–36.0)
MCV: 88.7 fL (ref 80.0–100.0)
Platelets: 200 10*3/uL (ref 150–400)
RBC: 3.71 MIL/uL — ABNORMAL LOW (ref 3.87–5.11)
RDW: 12.3 % (ref 11.5–15.5)
WBC: 10.6 10*3/uL — ABNORMAL HIGH (ref 4.0–10.5)
nRBC: 0 % (ref 0.0–0.2)

## 2020-03-09 LAB — GLUCOSE, CAPILLARY
Glucose-Capillary: 186 mg/dL — ABNORMAL HIGH (ref 70–99)
Glucose-Capillary: 173 mg/dL — ABNORMAL HIGH (ref 70–99)
Glucose-Capillary: 145 mg/dL — ABNORMAL HIGH (ref 70–99)
Glucose-Capillary: 150 mg/dL — ABNORMAL HIGH (ref 70–99)
Glucose-Capillary: 150 mg/dL — ABNORMAL HIGH (ref 70–99)

## 2020-03-09 LAB — BASIC METABOLIC PANEL
Anion gap: 11 (ref 5–15)
BUN: 9 mg/dL (ref 8–23)
CO2: 26 mmol/L (ref 22–32)
Calcium: 9.3 mg/dL (ref 8.9–10.3)
Chloride: 97 mmol/L — ABNORMAL LOW (ref 98–111)
Creatinine, Ser: 0.79 mg/dL (ref 0.44–1.00)
GFR, Estimated: >60 mL/min (ref >60)
Glucose, Bld: 258 mg/dL — ABNORMAL HIGH (ref 70–99)
Potassium: 3.7 mmol/L (ref 3.5–5.1)
Sodium: 134 mmol/L — ABNORMAL LOW (ref 135–145)

## 2020-03-09 LAB — TROPONIN I (HIGH SENSITIVITY): Troponin I (High Sensitivity): 25648 ng/L (ref <18)

## 2020-03-09 LAB — MRSA PCR SCREENING: MRSA by PCR: NEGATIVE

## 2020-03-09 LAB — HEPARIN LEVEL (UNFRACTIONATED): Heparin Unfractionated: 0.81 IU/mL — ABNORMAL HIGH (ref 0.30–0.70)

## 2020-03-09 LAB — APTT: aPTT: 47 seconds — ABNORMAL HIGH (ref 24–36)

## 2020-03-09 SURGERY — LEFT HEART CATH AND CORONARY ANGIOGRAPHY
Anesthesia: LOCAL

## 2020-03-09 MED ORDER — LABETALOL HCL 5 MG/ML IV SOLN: 10.0000 mg | INTRAVENOUS | Status: AC | PRN

## 2020-03-09 MED ORDER — HEPARIN SODIUM (PORCINE) 1000 UNIT/ML IJ SOLN
INTRAMUSCULAR | Status: DC | PRN
  Administered 2020-03-09: 4000 [IU] via INTRAVENOUS
  Administered 2020-03-09: 6000 [IU] via INTRAVENOUS
  Administered 2020-03-09: 2000 [IU] via INTRAVENOUS

## 2020-03-09 MED ORDER — NITROGLYCERIN 1 MG/10 ML FOR IR/CATH LAB
INTRA_ARTERIAL | Status: DC | PRN
  Administered 2020-03-09: 200 ug via INTRACORONARY
  Administered 2020-03-09: 200 ug via INTRA_ARTERIAL
  Administered 2020-03-09: 200 ug via INTRACORONARY
  Administered 2020-03-09: 400 ug via INTRA_ARTERIAL

## 2020-03-09 MED ORDER — VERAPAMIL HCL 2.5 MG/ML IV SOLN
INTRAVENOUS | Status: AC
  Filled 2020-03-09: qty 2

## 2020-03-09 MED ORDER — FENTANYL CITRATE (PF) 100 MCG/2ML IJ SOLN
INTRAMUSCULAR | Status: AC
  Filled 2020-03-09: qty 2

## 2020-03-09 MED ORDER — AMIODARONE HCL IN DEXTROSE 360-4.14 MG/200ML-% IV SOLN
30.0000 mg/h | INTRAVENOUS | Status: DC
  Filled 2020-03-09: qty 200

## 2020-03-09 MED ORDER — FENTANYL CITRATE (PF) 100 MCG/2ML IJ SOLN
INTRAMUSCULAR | Status: DC | PRN
  Administered 2020-03-09 (×3): 25 ug via INTRAVENOUS

## 2020-03-09 MED ORDER — NITROGLYCERIN 1 MG/10 ML FOR IR/CATH LAB
INTRA_ARTERIAL | Status: AC
  Filled 2020-03-09: qty 10

## 2020-03-09 MED ORDER — VERAPAMIL HCL 2.5 MG/ML IV SOLN
INTRAVENOUS | Status: DC | PRN
  Administered 2020-03-09: 10 mL via INTRA_ARTERIAL

## 2020-03-09 MED ORDER — ONDANSETRON HCL 4 MG/2ML IJ SOLN: 4.0000 mg | Freq: Four times a day (QID) | INTRAMUSCULAR | Status: DC | PRN

## 2020-03-09 MED ORDER — AMIODARONE LOAD VIA INFUSION
150.0000 mg | Freq: Once | INTRAVENOUS | Status: AC
  Administered 2020-03-09: 150 mg via INTRAVENOUS
  Filled 2020-03-09: qty 83.34

## 2020-03-09 MED ORDER — SODIUM CHLORIDE 0.9% FLUSH
3.0000 mL | Freq: Two times a day (BID) | INTRAVENOUS | Status: DC
  Administered 2020-03-09 – 2020-03-11 (×3): 3 mL via INTRAVENOUS

## 2020-03-09 MED ORDER — ASPIRIN 81 MG PO CHEW
81.0000 mg | CHEWABLE_TABLET | Freq: Every day | ORAL | Status: DC
  Administered 2020-03-10 – 2020-03-11 (×2): 81 mg via ORAL
  Filled 2020-03-09 (×2): qty 1

## 2020-03-09 MED ORDER — HEPARIN (PORCINE) IN NACL 1000-0.9 UT/500ML-% IV SOLN
INTRAVENOUS | Status: AC
  Filled 2020-03-09: qty 1000

## 2020-03-09 MED ORDER — CLOPIDOGREL BISULFATE 300 MG PO TABS
ORAL_TABLET | ORAL | Status: DC | PRN
  Administered 2020-03-09: 600 mg via ORAL

## 2020-03-09 MED ORDER — MIDAZOLAM HCL 2 MG/2ML IJ SOLN
INTRAMUSCULAR | Status: AC
  Filled 2020-03-09: qty 2

## 2020-03-09 MED ORDER — LIDOCAINE HCL (PF) 1 % IJ SOLN
INTRAMUSCULAR | Status: AC
  Filled 2020-03-09: qty 30

## 2020-03-09 MED ORDER — SODIUM CHLORIDE 0.9 % IV SOLN: 250.0000 mL | INTRAVENOUS | Status: DC | PRN

## 2020-03-09 MED ORDER — CLOPIDOGREL BISULFATE 75 MG PO TABS
75.0000 mg | ORAL_TABLET | Freq: Every day | ORAL | Status: DC
  Administered 2020-03-10 – 2020-03-11 (×2): 75 mg via ORAL
  Filled 2020-03-09 (×2): qty 1

## 2020-03-09 MED ORDER — HEPARIN SODIUM (PORCINE) 1000 UNIT/ML IJ SOLN
INTRAMUSCULAR | Status: AC
  Filled 2020-03-09: qty 1

## 2020-03-09 MED ORDER — SODIUM CHLORIDE 0.9% FLUSH: 3.0000 mL | INTRAVENOUS | Status: DC | PRN

## 2020-03-09 MED ORDER — LIDOCAINE HCL (PF) 1 % IJ SOLN
INTRAMUSCULAR | Status: DC | PRN
  Administered 2020-03-09: 2 mL

## 2020-03-09 MED ORDER — ACETAMINOPHEN 325 MG PO TABS: 650.0000 mg | ORAL_TABLET | ORAL | Status: DC | PRN

## 2020-03-09 MED ORDER — AMIODARONE HCL IN DEXTROSE 360-4.14 MG/200ML-% IV SOLN
60.0000 mg/h | INTRAVENOUS | Status: DC
  Administered 2020-03-09 (×2): 60 mg/h via INTRAVENOUS
  Filled 2020-03-09 (×2): qty 200

## 2020-03-09 MED ORDER — MIDAZOLAM HCL 2 MG/2ML IJ SOLN
INTRAMUSCULAR | Status: DC | PRN
  Administered 2020-03-09 (×3): 1 mg via INTRAVENOUS

## 2020-03-09 MED ORDER — HEPARIN (PORCINE) IN NACL 1000-0.9 UT/500ML-% IV SOLN
INTRAVENOUS | Status: DC | PRN
  Administered 2020-03-09 (×2): 500 mL

## 2020-03-09 MED ORDER — IOHEXOL 350 MG/ML SOLN
INTRAVENOUS | Status: DC | PRN
  Administered 2020-03-09: 170 mL

## 2020-03-09 MED ORDER — HYDRALAZINE HCL 20 MG/ML IJ SOLN: 10.0000 mg | INTRAMUSCULAR | Status: AC | PRN

## 2020-03-09 MED ORDER — ATROPINE SULFATE 1 MG/10ML IJ SOSY
PREFILLED_SYRINGE | INTRAMUSCULAR | Status: AC
  Filled 2020-03-09: qty 10

## 2020-03-09 MED ORDER — SODIUM CHLORIDE 0.9 % IV SOLN: INTRAVENOUS | Status: AC

## 2020-03-09 MED ORDER — CLOPIDOGREL BISULFATE 300 MG PO TABS
ORAL_TABLET | ORAL | Status: AC
  Filled 2020-03-09: qty 2

## 2020-03-09 SURGICAL SUPPLY — 21 items
BALLN SAPPHIRE 2.5X20 (BALLOONS) ×2
BALLN SAPPHIRE ~~LOC~~ 2.75X12 (BALLOONS) ×1 IMPLANT
BALLOON SAPPHIRE 2.5X20 (BALLOONS) IMPLANT
CATH 5FR JL3.5 JR4 ANG PIG MP (CATHETERS) ×1 IMPLANT
CATH LAUNCHER 6FR EBU3.5 (CATHETERS) ×1 IMPLANT
CATH VISTA GUIDE 6FR XBRCA (CATHETERS) ×1 IMPLANT
DEVICE RAD COMP TR BAND LRG (VASCULAR PRODUCTS) ×1 IMPLANT
GLIDESHEATH SLEND SS 6F .021 (SHEATH) ×1 IMPLANT
GUIDEWIRE INQWIRE 1.5J.035X260 (WIRE) IMPLANT
INQWIRE 1.5J .035X260CM (WIRE) ×2
KIT ENCORE 26 ADVANTAGE (KITS) ×1 IMPLANT
KIT HEART LEFT (KITS) ×2 IMPLANT
KIT HEMO VALVE WATCHDOG (MISCELLANEOUS) ×1 IMPLANT
PACK CARDIAC CATHETERIZATION (CUSTOM PROCEDURE TRAY) ×2 IMPLANT
STENT RESOLUTE ONYX 2.5X22 (Permanent Stent) ×1 IMPLANT
STENT RESOLUTE ONYX 2.5X26 (Permanent Stent) ×1 IMPLANT
STENT RESOLUTE ONYX 2.5X8 (Permanent Stent) ×1 IMPLANT
TRANSDUCER W/STOPCOCK (MISCELLANEOUS) ×2 IMPLANT
TUBING CIL FLEX 10 FLL-RA (TUBING) ×2 IMPLANT
WIRE ASAHI PROWATER 180CM (WIRE) ×1 IMPLANT
WIRE HI TORQ BMW 190CM (WIRE) ×1 IMPLANT

## 2020-03-09 NOTE — Progress Notes (Signed)
PT Nitro paste and Heparin removed upon arrival to cath lab.Will continue to monitor Jerald Kief, RN

## 2020-03-09 NOTE — Progress Notes (Signed)
Paged Cardiology: Patient was NSR before cath procedure.  She is back in the room and now in AFIB 80s-111.  She denies pain VSS.

## 2020-03-09 NOTE — Progress Notes (Signed)
Patient returned to bed from Cathlab.  TRBand on right wrist with 1ml air inserted at 1430.  Cap refill on right thum less than 3 seconds.  Patient denies any tingling or loss of sensation to right hand. Right wrist benign for hematoma with no oozing.  Patient denies all chest pain and pressure.  Nitro infusing at 61mcg/min will titrate off.

## 2020-03-09 NOTE — Progress Notes (Addendum)
Progress Note  Patient Name: [name] Date of Encounter: 03/09/2020  [facility] Cardiologist: Rozann Lesches, MD   Subjective   [name] this morning, reports it resolved overnight while on IV nitro.   Inpatient Medications    Scheduled Meds: . amitriptyline  10 mg Oral QHS  . aspirin  324 mg Oral Once  . [START ON 03/10/2020] aspirin EC  81 mg Oral Daily  . Chlorhexidine Gluconate Cloth  6 each Topical Daily  . diltiazem  240 mg Oral Daily  . insulin aspart  0-9 Units Subcutaneous TID WC  . levothyroxine  75 mcg Oral QAC breakfast  . losartan  25 mg Oral Daily  . metoprolol tartrate  100 mg Oral BID  . pantoprazole  40 mg Oral Daily  . rosuvastatin  5 mg Oral Daily  . sodium chloride flush  3 mL Intravenous Q12H  . timolol  1 drop Both Eyes Daily   Continuous Infusions: . sodium chloride    . sodium chloride 1 mL/kg/hr (03/09/20 0600)  . heparin 1,050 Units/hr (03/09/20 0700)  . nitroGLYCERIN 35 mcg/min (03/09/20 0803)   PRN Meds: sodium chloride, acetaminophen, albuterol, morphine injection, nitroGLYCERIN, ondansetron (ZOFRAN) IV, sodium chloride flush   Vital Signs    Vitals:   03/09/20 0600 03/09/20 0620 03/09/20 0700 03/09/20 0800  BP: (!) 118/58  (!) 111/56 (!) 83/54  Pulse: 64  64 61  Resp: 15  14 14   Temp:  99 F (37.2 C)  98 F (36.7 C)  TempSrc:  Oral  Oral  SpO2: 95%  96% 96%  Weight:      Height:        Intake/Output Summary (Last 24 hours) at 03/09/2020 0919 Last data filed at 03/09/2020 0700 Gross per 24 hour  Intake 360.1 ml  Output 500 ml  Net -139.9 ml   Last 3 Weights 03/09/2020 03/08/2020 03/08/2020  Weight (lbs) 184 lb 4.9 oz 182 lb 1.6 oz 185 lb 3 oz  Weight (kg) 83.6 kg 82.6 kg 84 kg      Telemetry    SR, PVCs - Personally Reviewed  ECG    SR with TWI in lateral leads - Personally Reviewed  Physical Exam  Pleasant older female GEN: No acute distress.   Neck: No JVD Cardiac: RRR, no murmurs, rubs, or  gallops.  Respiratory: Clear to auscultation bilaterally. GI: Soft, nontender, non-distended  MS: 1+ LE edema bilaterally; No deformity. Neuro:  Nonfocal  Psych: Normal affect   Labs    High Sensitivity Troponin:   Recent Labs  Lab 03/08/20 1112 03/08/20 1316 03/08/20 1610 03/08/20 2159  TROPONINIHS 34* 57* 1,243* 25,648*      Chemistry Recent Labs  Lab 03/08/20 1112 03/09/20 0209  NA 136 134*  K 3.9 3.7  CL 100 97*  CO2 27 26  GLUCOSE 241* 258*  BUN 13 9  CREATININE 0.96 0.79  CALCIUM 9.3 9.3  PROT 7.5  --   ALBUMIN 4.3  --   AST 20  --   ALT 16  --   ALKPHOS 67  --   BILITOT 0.4  --   GFRNONAA >60 >60  ANIONGAP 9 11     Hematology Recent Labs  Lab 03/08/20 1112 03/08/20 1610 03/09/20 0209  WBC 8.0 8.2 10.6*  RBC 4.16 4.16 3.71*  HGB 12.5 12.7 11.5*  HCT 38.5 37.4 32.9*  MCV 92.5 89.9 88.7  MCH 30.0 30.5 31.0  MCHC 32.5 34.0 35.0  RDW 12.4  12.1 12.3  PLT 214 172 200    BNPNo results for input(s): BNP, PROBNP in the last 168 hours.   DDimer No results for input(s): DDIMER in the last 168 hours.   Radiology    DG Chest Port 1 View  Result Date: 03/08/2020 CLINICAL DATA:  Left-sided shoulder pain beginning this morning. Atrial fibrillation. Diabetes. EXAM: PORTABLE CHEST 1 VIEW COMPARISON:  11/11/2018 FINDINGS: Stable mild cardiomegaly. Both lungs are clear. No evidence of pleural effusion. IMPRESSION: Stable mild cardiomegaly. No active lung disease. Electronically Signed   By: Marlaine Hind M.D.   On: 03/08/2020 11:24    Cardiac Studies   N/a   Patient Profile     80 y.o. female with PMH of Jehovah's Witness with a hx of PAF (amiodarone discontinued due to hypothyroidism, continues on Eliquis), CAD status post DES to the LAD in 2010 as well as 2017, hypertension, hyperlipidemia, type 2 diabetes mellitus, and PSVT who was for the evaluation of chest pain at the request of Dr. Roderic Palau.  Assessment & Plan    1. [place]I: [place] [address] this  morning. EKG without ischemic changes this morning. No longer having chest pain this morning. Remains on IV nitro and heparin. Planned for cardiac cath this morning.  -- on asa, statin, BB and ARB -- The patient understands that risks included but are not limited to stroke (1 in 1000), death (1 in 1000), kidney failure [usually temporary] (1 in 500), bleeding (1 in 200), allergic reaction [possibly serious] (1 in 200).  -- check echo  2. PAF: remains in SR. Eliquis held, on heparin with plans for cath. Has been on metoprolol and Diltiazem as an outpatient. Blood pressures are soft this morning. Will hold morning doses for now.   3. HLD: reports hx of statin intolerance. Had been on Zocor in the past, but switched to Crestor 5mg . Has been tolerating. Consider increasing pending cath results.   4. HTN: blood pressures were significantly elevated on admission but now soft this morning. Will hold morning medications for now with the need for nitro.   5. DM: on metformin PTA.  -- check Hgb A1c -- SSI while inpatient   For questions or updates, please contact [facility] Please consult [email or web address] for contact info under    Signed, Reino Bellis, NP  03/09/2020, 9:19 AM     Patient seen and examined. Agree with assessment and plan.  Cardiac catheterization today by Dr. Irish Lack.  Catheterization images reviewed.  She underwent successful two-vessel 3 lesion PCI with stenting of the ramus intermediate vessel and tandem stenting of the mid RCA.  Currently she is without angina.  She had developed recurrent atrial fibrillation this afternoon and was started on amiodarone drip.  It appears now that she is converting to sinus rhythm with heart rate now in the 80s.  Right radial site stable.  Plan is to resume apixaban tomorrow.   Troy Sine, MD, [facility] 03/09/2020 5:38 PM

## 2020-03-09 NOTE — Plan of Care (Signed)
  Problem: Activity: Goal: Ability to return to baseline activity level will improve Outcome: Progressing   Problem: Cardiovascular: Goal: Ability to achieve and maintain adequate cardiovascular perfusion will improve Outcome: Progressing Goal: Vascular access site(s) Level 0-1 will be maintained Outcome: Progressing   

## 2020-03-09 NOTE — Progress Notes (Signed)
Spoke with cardiology provider.  Patient's SBP has been dropping into the 80s.  Patient is scheduled to take metoprolol 100 mg, cardizem 60 mg and Cozar 25mg .  Instructed to hold these meds at this time.

## 2020-03-09 NOTE — Interval H&P Note (Signed)
Cath Lab Visit (complete for each Cath Lab visit)  Clinical Evaluation Leading to the Procedure:   ACS: Yes.    Non-ACS:    Anginal Classification: CCS IV  Anti-ischemic medical therapy: Minimal Therapy (1 class of medications)  Non-Invasive Test Results: No non-invasive testing performed  Prior CABG: No previous CABG      History and Physical Interval Note:  03/09/2020 12:38 PM  [name]  has presented today for surgery, with the diagnosis of unstable angina.  The various methods of treatment have been discussed with the patient and family. After consideration of risks, benefits and other options for treatment, the patient has consented to  Procedure(s): LEFT HEART CATH AND CORONARY ANGIOGRAPHY (N/A) as a surgical intervention.  The patient's history has been reviewed, patient examined, no change in status, stable for surgery.  I have reviewed the patient's chart and labs.  Questions were answered to the patient's satisfaction.     Larae Grooms

## 2020-03-09 NOTE — Progress Notes (Signed)
Patient off unit to cath lab.

## 2020-03-09 NOTE — Progress Notes (Signed)
ANTICOAGULATION CONSULT NOTE   Pharmacy Consult for Heparin Indication: chest pain/ACS  No Known Allergies  Patient Measurements: Height: 5\' 1"  (154.9 cm) Weight: 83.6 kg (184 lb 4.9 oz) IBW/kg (Calculated) : 47.8 HEPARIN DW (KG): 66.6  Vital Signs: Temp: 98 F (36.7 C) (01/18 0800) Temp Source: Oral (01/18 0800) BP: 83/54 (01/18 0800) Pulse Rate: 61 (01/18 0800)  Labs: Recent Labs    03/08/20 1112 03/08/20 1112 03/08/20 1316 03/08/20 1610 03/08/20 2010 03/08/20 2159 03/09/20 0209 03/09/20 0745  HGB 12.5  --   --  12.7  --   --  11.5*  --   HCT 38.5  --   --  37.4  --   --  32.9*  --   PLT 214  --   --  172  --   --  200  --   APTT  --    < > 31  --  36 39*  --  47*  HEPARINUNFRC  --   --  1.90*  --   --   --   --  0.81*  CREATININE 0.96  --   --   --   --   --  0.79  --   TROPONINIHS 34*  --  57* 1,243*  --  02,233*  --   --    < > = values in this interval not displayed.    Estimated Creatinine Clearance: 55.9 mL/min (by C-G formula based on SCr of 0.79 mg/dL).   Medical History: Past Medical History:  Diagnosis Date  . Anxiety   . Arthritis   . Bulging lumbar disc   . Chronic lower back pain   . Coronary atherosclerosis of native coronary artery    DES LAD 02/2008, DES LAD 02/2015  . Depression   . Dyslipidemia   . Essential hypertension   . Facial numbness   . Frequent headaches   . GERD (gastroesophageal reflux disease)   . Glaucoma   . Insomnia   . Paroxysmal atrial fibrillation [facility])    Diagnosed October 2019  . PSVT (paroxysmal supraventricular tachycardia) ([place])   . Refusal of blood transfusions as patient is Jehovah's Witness   . Thyroid disease   . Type 2 diabetes mellitus (HCC)    Borderline  . Vitamin D deficiency     Medications:  See med rec  Assessment: Patient presented to the ED with chest pain and shoulder pain. Patient is chronically anticoagulated with eliquis and last dose was 03/07/2020 @ 1900. Pharmacy asked to start  heparin. Will start heparin without bolus and monitor APTT and anti-Xa until correlate. Baseline APTT and anti-Xa level ordered  APTT this morning still low at 47s, HL trending down but still not correlated at 0.8. Hgb trending down 12s>11.5, plt wnl. No bleeding noted by patient or nurse.   Goal of Therapy:  Heparin level 0.3-0.7 units/ml aPTT 66-102 seconds Monitor platelets by anticoagulation protocol: Yes   Plan:  Increase heparin infusion to 1250 units/hr Planning cath today - f/up afterwards  Erin Hearing PharmD., BCPS Clinical Pharmacist 03/09/2020 9:29 AM

## 2020-03-09 NOTE — Progress Notes (Signed)
    Called by RN that patient arrived from the cath lab in afib. Does have hx of the same. On Eliquis prior to admission. Blood pressures were soft this morning and morning meds held. HR in the 90-110s range. Blood pressures stable. Will start IV amiodarone as I suspect she will not convert with simply resuming her home medications. Will plan to add back BB/+ or - Dilt tomorrow as blood pressures allow. Plan to restart Eliquis tomorrow pending stable cath site.   SignedReino Bellis, NP-C 03/09/2020, 4:35 PM Pager: [phone]

## 2020-03-09 NOTE — Progress Notes (Addendum)
1845 When I had reached removing 8 cc of air (in increments of 2 cc/15 mins) and right wrist started oozing from sight. Achieved hemostatis at 14 cc of air.  Will restart again 1845

## 2020-03-10 ENCOUNTER — Inpatient Hospital Stay (HOSPITAL_COMMUNITY): Payer: Medicare Other

## 2020-03-10 DIAGNOSIS — Z7901 Long term (current) use of anticoagulants: Secondary | ICD-10-CM

## 2020-03-10 DIAGNOSIS — I214 Non-ST elevation (NSTEMI) myocardial infarction: Secondary | ICD-10-CM | POA: Diagnosis not present

## 2020-03-10 DIAGNOSIS — E7800 Pure hypercholesterolemia, unspecified: Secondary | ICD-10-CM

## 2020-03-10 LAB — BASIC METABOLIC PANEL
Anion gap: 10 (ref 5–15)
BUN: 9 mg/dL (ref 8–23)
CO2: 24 mmol/L (ref 22–32)
Calcium: 8.9 mg/dL (ref 8.9–10.3)
Chloride: 100 mmol/L (ref 98–111)
Creatinine, Ser: 0.88 mg/dL (ref 0.44–1.00)
GFR, Estimated: >60 mL/min (ref >60)
Glucose, Bld: 162 mg/dL — ABNORMAL HIGH (ref 70–99)
Potassium: 3.4 mmol/L — ABNORMAL LOW (ref 3.5–5.1)
Sodium: 134 mmol/L — ABNORMAL LOW (ref 135–145)

## 2020-03-10 LAB — ECHOCARDIOGRAM COMPLETE
AR max vel: 1.08 cm2
AV Area VTI: 1.22 cm2
AV Area mean vel: 1.01 cm2
AV Mean grad: 7 mmHg
AV Peak grad: 12.1 mmHg
Ao pk vel: 1.74 m/s
Area-P 1/2: 3.91 cm2
Height: 61 in
P 1/2 time: 457 msec
S' Lateral: 2.5 cm
Weight: 2885.38 oz

## 2020-03-10 LAB — GLUCOSE, CAPILLARY
Glucose-Capillary: 157 mg/dL — ABNORMAL HIGH (ref 70–99)
Glucose-Capillary: 171 mg/dL — ABNORMAL HIGH (ref 70–99)
Glucose-Capillary: 171 mg/dL — ABNORMAL HIGH (ref 70–99)
Glucose-Capillary: 183 mg/dL — ABNORMAL HIGH (ref 70–99)

## 2020-03-10 LAB — CBC
HCT: 33 % — ABNORMAL LOW (ref 36.0–46.0)
Hemoglobin: 11.3 g/dL — ABNORMAL LOW (ref 12.0–15.0)
MCH: 30.7 pg (ref 26.0–34.0)
MCHC: 34.2 g/dL (ref 30.0–36.0)
MCV: 89.7 fL (ref 80.0–100.0)
Platelets: 177 10*3/uL (ref 150–400)
RBC: 3.68 MIL/uL — ABNORMAL LOW (ref 3.87–5.11)
RDW: 12.6 % (ref 11.5–15.5)
WBC: 9.3 10*3/uL (ref 4.0–10.5)
nRBC: 0 % (ref 0.0–0.2)

## 2020-03-10 LAB — HEMOGLOBIN A1C
Hgb A1c MFr Bld: 6.5 % — ABNORMAL HIGH (ref 4.8–5.6)
Mean Plasma Glucose: 139.85 mg/dL

## 2020-03-10 LAB — APTT: aPTT: 31 seconds (ref 24–36)

## 2020-03-10 MED ORDER — POTASSIUM CHLORIDE CRYS ER 20 MEQ PO TBCR
40.0000 meq | EXTENDED_RELEASE_TABLET | Freq: Once | ORAL | Status: AC
  Administered 2020-03-10: 40 meq via ORAL
  Filled 2020-03-10: qty 2

## 2020-03-10 MED ORDER — LOSARTAN POTASSIUM 50 MG PO TABS
50.0000 mg | ORAL_TABLET | Freq: Every day | ORAL | Status: DC
  Administered 2020-03-11: 50 mg via ORAL
  Filled 2020-03-10: qty 1

## 2020-03-10 MED ORDER — PNEUMOCOCCAL VAC POLYVALENT 25 MCG/0.5ML IJ INJ
0.5000 mL | INJECTION | INTRAMUSCULAR | Status: AC
  Administered 2020-03-11: 0.5 mL via INTRAMUSCULAR
  Filled 2020-03-10: qty 0.5

## 2020-03-10 MED ORDER — APIXABAN 5 MG PO TABS
5.0000 mg | ORAL_TABLET | Freq: Two times a day (BID) | ORAL | Status: DC
  Administered 2020-03-10 – 2020-03-11 (×3): 5 mg via ORAL
  Filled 2020-03-10 (×3): qty 1

## 2020-03-10 MED ORDER — ROSUVASTATIN CALCIUM 5 MG PO TABS
10.0000 mg | ORAL_TABLET | Freq: Every day | ORAL | Status: DC
  Administered 2020-03-11: 10 mg via ORAL
  Filled 2020-03-10: qty 2

## 2020-03-10 NOTE — Progress Notes (Addendum)
CARDIAC REHAB PHASE I   Offered to walk with pt. Pt requesting time to rest as she ambulated this am. Pt educated on importance of ASA, Plavix, Eliquis, NTG, and statin. Reviewed signs and symptoms of bleeding. MI education completed with pt. Pt given MI book and stent card along with heart healthy and diabetic diets. Reviewed site care, restrictions, and exercise guidelines. Will refer to CRP II . Encouraged ambulation later today. Will f/u tomorrow to address any further questions or concerns.   [id number] Julie Falco, RN BSN 03/10/2020 10:27 AM

## 2020-03-10 NOTE — Progress Notes (Signed)
  Echocardiogram 2D Echocardiogram has been performed.  [name] 03/10/2020, 2:46 PM

## 2020-03-10 NOTE — Progress Notes (Signed)
Progress Note  Patient Name: [name] Date of Encounter: 03/10/2020  Primary Cardiologist: Dr. Johnny Bridge  Subjective   No recurrent chest pain  Inpatient Medications    Scheduled Meds: . amitriptyline  10 mg Oral QHS  . aspirin  81 mg Oral Daily  . Chlorhexidine Gluconate Cloth  6 each Topical Daily  . clopidogrel  75 mg Oral Q breakfast  . diltiazem  240 mg Oral Daily  . insulin aspart  0-9 Units Subcutaneous TID WC  . levothyroxine  75 mcg Oral QAC breakfast  . losartan  25 mg Oral Daily  . metoprolol tartrate  100 mg Oral BID  . pantoprazole  40 mg Oral Daily  . rosuvastatin  5 mg Oral Daily  . sodium chloride flush  3 mL Intravenous Q12H  . timolol  1 drop Both Eyes Daily   Continuous Infusions: . sodium chloride    . amiodarone 30 mg/hr (03/10/20 0700)  . nitroGLYCERIN Stopped (03/09/20 1615)   PRN Meds: sodium chloride, acetaminophen, albuterol, morphine injection, nitroGLYCERIN, ondansetron (ZOFRAN) IV, sodium chloride flush   Vital Signs    Vitals:   03/10/20 0800 03/10/20 0830 03/10/20 0900 03/10/20 1023  BP: (!) 135/52 (!) 150/62 138/61 (!) 147/65  Pulse: 74 73 69 71  Resp: 19 19 19 16   Temp:      TempSrc:      SpO2: 98% 98% 96% 97%  Weight:      Height:        Intake/Output Summary (Last 24 hours) at 03/10/2020 1132 Last data filed at 03/10/2020 1000 Gross per 24 hour  Intake 2030.79 ml  Output 1300 ml  Net 730.79 ml    I/O since admission:   Filed Weights   03/08/20 1800 03/09/20 0419 03/10/20 0500  Weight: 82.6 kg 83.6 kg 81.8 kg    Telemetry    Sinus - Personally Reviewed  ECG    ECG (independently read by me):  Physical Exam   BP (!) 147/65   Pulse 71   Temp 98.6 F (37 C) (Oral)   Resp 16   Ht 5\' 1"  (1.549 m)   Wt 81.8 kg   LMP  (LMP Unknown)   SpO2 97%   BMI 34.07 kg/m  General: Alert, oriented, no distress.  Skin: normal turgor, no rashes, warm and dry HEENT: Normocephalic, atraumatic. Pupils equal  round and reactive to light; sclera anicteric; extraocular muscles intact;  Nose without nasal septal hypertrophy Mouth/Parynx benign;  Neck: No JVD, no carotid bruits; normal carotid upstroke Lungs: clear to ausculatation and percussion; no wheezing or rales Chest wall: without tenderness to palpitation Heart: PMI not displaced, RRR, s1 s2 normal, 1/6 systolic murmur, no diastolic murmur, no rubs, gallops, thrills, or heaves Abdomen: soft, nontender; no hepatosplenomehaly, BS+; abdominal aorta nontender and not dilated by palpation. Back: no CVA tenderness Pulses 2+ Musculoskeletal: full range of motion, normal strength, no joint deformities Extremities: no clubbing cyanosis or edema, Homan's sign negative  Neurologic: grossly nonfocal; Cranial nerves grossly wnl Psychologic: Normal mood and affect     Labs    Chemistry Recent Labs  Lab 03/08/20 1112 03/09/20 0209 03/10/20 0230  NA 136 134* 134*  K 3.9 3.7 3.4*  CL 100 97* 100  CO2 27 26 24   GLUCOSE 241* 258* 162*  BUN 13 9 9   CREATININE 0.96 0.79 0.88  CALCIUM 9.3 9.3 8.9  PROT 7.5  --   --   ALBUMIN 4.3  --   --  AST 20  --   --   ALT 16  --   --   ALKPHOS 67  --   --   BILITOT 0.4  --   --   GFRNONAA >60 >60 >60  ANIONGAP 9 11 10      Hematology Recent Labs  Lab 03/08/20 1610 03/09/20 0209 03/10/20 0230  WBC 8.2 10.6* 9.3  RBC 4.16 3.71* 3.68*  HGB 12.7 11.5* 11.3*  HCT 37.4 32.9* 33.0*  MCV 89.9 88.7 89.7  MCH 30.5 31.0 30.7  MCHC 34.0 35.0 34.2  RDW 12.1 12.3 12.6  PLT 172 200 177   HS TROPONIN: 34 > 57 > 1243 > 25648  Cardiac EnzymesNo results for input(s): TROPONINI in the last 168 hours. No results for input(s): TROPIPOC in the last 168 hours.   BNPNo results for input(s): BNP, PROBNP in the last 168 hours.   DDimer No results for input(s): DDIMER in the last 168 hours.   Lipid Panel     Component Value Date/Time   CHOL 124 03/09/2020 0209   TRIG 58 03/09/2020 0209   HDL 51 03/09/2020  0209   CHOLHDL 2.4 03/09/2020 0209   VLDL 12 03/09/2020 0209   LDLCALC 61 03/09/2020 0209     Radiology    CARDIAC CATHETERIZATION  Result Date: 03/09/2020  Prior LAD stents were patient.  Ramus-1 lesion is 95% stenosed. A drug-eluting stent was successfully placed using a STENT RESOLUTE ONYX 2.5X22, postdilated to 2.75 mm. T  Post intervention, there is a 0% residual stenosis.  Ramus-2 lesion is 50% stenosed in tortuous segment distal to the stent.  Post intervention, there is a 0% residual stenosis.  Mid RCA-1 lesion is 75% stenosed. A drug-eluting stent was successfully placed using a STENT RESOLUTE ONYX 2.5X26, postdilated to 2.75 mm.  Post intervention, there is a 0% residual stenosis.  Mid RCA-2 lesion is 75% stenosed, likely edge dissection in tortuous segment. A drug-eluting stent was successfully placed using a STENT RESOLUTE ONYX 2.5X8, A drug-eluting stent was successfully placed using a STENT RESOLUTE ONYX 2.5X8, postdilated to 2.75 mm.  Ost Cx to Prox Cx lesion is 50% stenosed.  The left ventricular systolic function is normal.  LV end diastolic pressure is mildly elevated.  The left ventricular ejection fraction is 50-55% by visual estimate.  There is no aortic valve stenosis.  A drug-eluting stent was successfully placed using a STENT RESOLUTE ONYX 2.5X22.  A drug-eluting stent was successfully placed using a STENT RESOLUTE ONYX 2.5X26.  A drug-eluting stent was successfully placed using a STENT RESOLUTE ONYX 2.5X8.  Restart Eliquis tomorrow.  Aspirin for only 1 week since I am concerned about her bleeding risk.  Clopidogrel for 1 year if tolerated from a bleeding standpoint.  At minimum, would want at least 6 months of clopidogrel.      Cardiac Studies   CATH/PCI 03/09/2020  Prior LAD stents were patient.  Ramus-1 lesion is 95% stenosed. A drug-eluting stent was successfully placed using a STENT RESOLUTE ONYX 2.5X22, postdilated to 2.75 mm. T  Post intervention,  there is a 0% residual stenosis.  Ramus-2 lesion is 50% stenosed in tortuous segment distal to the stent.  Post intervention, there is a 0% residual stenosis.  Mid RCA-1 lesion is 75% stenosed. A drug-eluting stent was successfully placed using a STENT RESOLUTE ONYX 2.5X26, postdilated to 2.75 mm.  Post intervention, there is a 0% residual stenosis.  Mid RCA-2 lesion is 75% stenosed, likely edge dissection in tortuous segment. A  drug-eluting stent was successfully placed using a STENT RESOLUTE ONYX 2.5X8, A drug-eluting stent was successfully placed using a STENT RESOLUTE ONYX 2.5X8, postdilated to 2.75 mm.  Ost Cx to Prox Cx lesion is 50% stenosed.  The left ventricular systolic function is normal.  LV end diastolic pressure is mildly elevated.  The left ventricular ejection fraction is 50-55% by visual estimate.  There is no aortic valve stenosis.  A drug-eluting stent was successfully placed using a STENT RESOLUTE ONYX 2.5X22.  A drug-eluting stent was successfully placed using a STENT RESOLUTE ONYX 2.5X26.  A drug-eluting stent was successfully placed using a STENT RESOLUTE ONYX 2.5X8.   Restart Eliquis tomorrow.  Aspirin for only 1 week since I am concerned about her bleeding risk.  Clopidogrel for 1 year if tolerated from a bleeding standpoint.  At minimum, would want at least 6 months of clopidogrel.       Intervention    Patient Profile     80 y.o. female with PMH of Jehovah's Witnesswith a hx ofPAF(amiodarone discontinued due to hypothyroidism, continueson Eliquis), CADstatus post DES to the LAD in 2010 as well as 2017, hypertension, hyperlipidemia, type 2 diabetes mellitus, and PSVTwho was for the evaluation ofchest painat the request of Dr. Roderic Palau.   Assessment & Plan    1. NSTEMI: High-sensitivity troponin increased to 25648.  Catheterization data was reviewed with patient and she underwent two-vessel 3 lesion PCI with stenting of the ramus intermediate  intending stenting of the mid RCA.  She has been without recurrent atrial fibrillation.  As per recommendations by Dr.Varanasi, commend triple drug therapy for 1 week and then Plavix/Eliquis for 1 year depending upon bleed history.  No recurrent chest pain today.  2.  PAF: Patient developed atrial fibrillation yesterday and was on amiodarone bolus plus infusion.  Metoprolol 100 mg twice daily was resumed this morning.  Telemetry reveals sinus rhythm.  Will DC amiodarone.  Home medicines with diltiazem to 40 mg and metoprolol 100 mg twice a day resumed  3. Anticoagulation: We will resume Eliquis today.  Converted to sinus rhythm late yesterday.  4.  Hypertension: Blood pressure improved but still slightly elevated today.  Now back on metoprolol tartrate 100 mg twice a day in addition to diltiazem.  We will further titrate losartan to 50 mg daily.  5. hyperlipidemia: Now on rosuvastatin, will increase from 5 mg to 10 mg to see if tolerates.  6. Diabetes mellitus: On metformin prior to admission.  Will transfer to cardiac telemetry today.  Signed, Troy Sine, MD, [facility] 03/10/2020, 11:32 AM

## 2020-03-10 NOTE — Plan of Care (Signed)
  Problem: Activity: Goal: Ability to return to baseline activity level will improve Outcome: Progressing   Problem: Cardiovascular: Goal: Ability to achieve and maintain adequate cardiovascular perfusion will improve Outcome: Progressing Goal: Vascular access site(s) Level 0-1 will be maintained Outcome: Progressing   

## 2020-03-11 ENCOUNTER — Encounter (HOSPITAL_COMMUNITY): Payer: Self-pay | Admitting: Cardiology

## 2020-03-11 ENCOUNTER — Other Ambulatory Visit (HOSPITAL_COMMUNITY): Payer: Self-pay | Admitting: Cardiology

## 2020-03-11 ENCOUNTER — Ambulatory Visit (HOSPITAL_COMMUNITY): Admission: RE | Admit: 2020-03-11 | Payer: Medicare Other | Source: Ambulatory Visit

## 2020-03-11 DIAGNOSIS — E785 Hyperlipidemia, unspecified: Secondary | ICD-10-CM

## 2020-03-11 LAB — CBC
HCT: 32 % — ABNORMAL LOW (ref 36.0–46.0)
Hemoglobin: 11.1 g/dL — ABNORMAL LOW (ref 12.0–15.0)
MCH: 31.6 pg (ref 26.0–34.0)
MCHC: 34.7 g/dL (ref 30.0–36.0)
MCV: 91.2 fL (ref 80.0–100.0)
Platelets: 164 10*3/uL (ref 150–400)
RBC: 3.51 MIL/uL — ABNORMAL LOW (ref 3.87–5.11)
RDW: 12.5 % (ref 11.5–15.5)
WBC: 9.3 10*3/uL (ref 4.0–10.5)
nRBC: 0 % (ref 0.0–0.2)

## 2020-03-11 LAB — BASIC METABOLIC PANEL
Anion gap: 10 (ref 5–15)
BUN: 9 mg/dL (ref 8–23)
CO2: 23 mmol/L (ref 22–32)
Calcium: 8.8 mg/dL — ABNORMAL LOW (ref 8.9–10.3)
Chloride: 105 mmol/L (ref 98–111)
Creatinine, Ser: 0.86 mg/dL (ref 0.44–1.00)
GFR, Estimated: >60 mL/min (ref >60)
Glucose, Bld: 147 mg/dL — ABNORMAL HIGH (ref 70–99)
Potassium: 3.7 mmol/L (ref 3.5–5.1)
Sodium: 138 mmol/L (ref 135–145)

## 2020-03-11 LAB — APTT: aPTT: 33 seconds (ref 24–36)

## 2020-03-11 LAB — GLUCOSE, CAPILLARY
Glucose-Capillary: 125 mg/dL — ABNORMAL HIGH (ref 70–99)
Glucose-Capillary: 191 mg/dL — ABNORMAL HIGH (ref 70–99)
Glucose-Capillary: 133 mg/dL — ABNORMAL HIGH (ref 70–99)

## 2020-03-11 LAB — MAGNESIUM: Magnesium: 1.9 mg/dL (ref 1.7–2.4)

## 2020-03-11 MED ORDER — LOSARTAN POTASSIUM 50 MG PO TABS: 50.0000 mg | ORAL_TABLET | Freq: Every day | ORAL | 0 refills | Status: DC

## 2020-03-11 MED ORDER — CLOPIDOGREL BISULFATE 75 MG PO TABS: 75.0000 mg | ORAL_TABLET | Freq: Every day | ORAL | 2 refills | Status: DC

## 2020-03-11 MED ORDER — ROSUVASTATIN CALCIUM 20 MG PO TABS: 20.0000 mg | ORAL_TABLET | Freq: Every day | ORAL | Status: DC

## 2020-03-11 MED ORDER — ROSUVASTATIN CALCIUM 20 MG PO TABS: 20.0000 mg | ORAL_TABLET | Freq: Every day | ORAL | 0 refills | Status: DC

## 2020-03-11 MED ORDER — DILTIAZEM HCL ER COATED BEADS 240 MG PO CP24: 240.0000 mg | ORAL_CAPSULE | Freq: Every day | ORAL | 0 refills | Status: DC

## 2020-03-11 MED ORDER — ASPIRIN 81 MG PO CHEW: 81.0000 mg | CHEWABLE_TABLET | Freq: Every day | ORAL | 0 refills | Status: DC

## 2020-03-11 MED FILL — CLOPIDOGREL 75 MG TABLET: 75 | 90 days supply | Qty: 90 | Fill #0

## 2020-03-11 MED FILL — LOSARTAN POTASSIUM 50 MG TA: 50 | 90 days supply | Qty: 90 | Fill #0

## 2020-03-11 MED FILL — CARTIA XT 240 MG CAPSULE: 240 | 90 days supply | Qty: 90 | Fill #0

## 2020-03-11 MED FILL — ASPIRIN LOW DOSE 81 MG CHEW: 81 | 5 days supply | Qty: 5 | Fill #0

## 2020-03-11 MED FILL — ROSUVASTATIN CALCIUM 20 MG: 20 | 30 days supply | Qty: 30 | Fill #0

## 2020-03-11 NOTE — Progress Notes (Addendum)
Progress Note  Patient Name: [name] Date of Encounter: 03/11/2020  [facility] Cardiologist: Rozann Lesches, MD   Subjective   Feeling pretty good this morning.   Inpatient Medications    Scheduled Meds: . amitriptyline  10 mg Oral QHS  . apixaban  5 mg Oral BID  . aspirin  81 mg Oral Daily  . Chlorhexidine Gluconate Cloth  6 each Topical Daily  . clopidogrel  75 mg Oral Q breakfast  . diltiazem  240 mg Oral Daily  . insulin aspart  0-9 Units Subcutaneous TID WC  . levothyroxine  75 mcg Oral QAC breakfast  . losartan  50 mg Oral Daily  . metoprolol tartrate  100 mg Oral BID  . pantoprazole  40 mg Oral Daily  . rosuvastatin  10 mg Oral Daily  . sodium chloride flush  3 mL Intravenous Q12H  . timolol  1 drop Both Eyes Daily   Continuous Infusions: . sodium chloride    . nitroGLYCERIN Stopped (03/09/20 1615)   PRN Meds: sodium chloride, acetaminophen, albuterol, morphine injection, nitroGLYCERIN, ondansetron (ZOFRAN) IV, sodium chloride flush   Vital Signs    Vitals:   03/11/20 0045 03/11/20 0625 03/11/20 0846 03/11/20 0851  BP: (!) 155/66 (!) 144/56 130/64 130/64  Pulse: 68 71 73   Resp: 16 19    Temp: 98.6 F (37 C) 98.5 F (36.9 C)    TempSrc: Oral Oral    SpO2: 100% 99%    Weight:  79.9 kg    Height:        Intake/Output Summary (Last 24 hours) at 03/11/2020 1143 Last data filed at 03/11/2020 1040 Gross per 24 hour  Intake 420 ml  Output -  Net 420 ml   Last 3 Weights 03/11/2020 03/10/2020 03/09/2020  Weight (lbs) 176 lb 3.2 oz 180 lb 5.4 oz 184 lb 4.9 oz  Weight (kg) 79.924 kg 81.8 kg 83.6 kg      Telemetry    SR - Personally Reviewed  ECG    SR 69 bpm - Personally Reviewed  Physical Exam   GEN: No acute distress.   Neck: No JVD Cardiac: RRR, no murmurs, rubs, or gallops.  Respiratory: Clear to auscultation bilaterally. GI: Soft, nontender, non-distended  MS: No edema; No deformity. Neuro:  Nonfocal  Psych: Normal affect    Labs    High Sensitivity Troponin:   Recent Labs  Lab 03/08/20 1112 03/08/20 1316 03/08/20 1610 03/08/20 2159  TROPONINIHS 34* 57* 1,243* 25,648*      Chemistry Recent Labs  Lab 03/08/20 1112 03/09/20 0209 03/10/20 0230 03/11/20 0325  NA 136 134* 134* 138  K 3.9 3.7 3.4* 3.7  CL 100 97* 100 105  CO2 27 26 24 23   GLUCOSE 241* 258* 162* 147*  BUN 13 9 9 9   CREATININE 0.96 0.79 0.88 0.86  CALCIUM 9.3 9.3 8.9 8.8*  PROT 7.5  --   --   --   ALBUMIN 4.3  --   --   --   AST 20  --   --   --   ALT 16  --   --   --   ALKPHOS 67  --   --   --   BILITOT 0.4  --   --   --   GFRNONAA >60 >60 >60 >60  ANIONGAP 9 11 10 10      Hematology Recent Labs  Lab 03/09/20 0209 03/10/20 0230 03/11/20 0325  WBC 10.6* 9.3 9.3  RBC 3.71* 3.68* 3.51*  HGB 11.5* 11.3* 11.1*  HCT 32.9* 33.0* 32.0*  MCV 88.7 89.7 91.2  MCH 31.0 30.7 31.6  MCHC 35.0 34.2 34.7  RDW 12.3 12.6 12.5  PLT 200 177 164    BNPNo results for input(s): BNP, PROBNP in the last 168 hours.   DDimer No results for input(s): DDIMER in the last 168 hours.   Radiology    CARDIAC CATHETERIZATION  Result Date: 03/09/2020  Prior LAD stents were patient.  Ramus-1 lesion is 95% stenosed. A drug-eluting stent was successfully placed using a STENT RESOLUTE ONYX 2.5X22, postdilated to 2.75 mm. T  Post intervention, there is a 0% residual stenosis.  Ramus-2 lesion is 50% stenosed in tortuous segment distal to the stent.  Post intervention, there is a 0% residual stenosis.  Mid RCA-1 lesion is 75% stenosed. A drug-eluting stent was successfully placed using a STENT RESOLUTE ONYX 2.5X26, postdilated to 2.75 mm.  Post intervention, there is a 0% residual stenosis.  Mid RCA-2 lesion is 75% stenosed, likely edge dissection in tortuous segment. A drug-eluting stent was successfully placed using a STENT RESOLUTE ONYX 2.5X8, A drug-eluting stent was successfully placed using a STENT RESOLUTE ONYX 2.5X8, postdilated to 2.75 mm.   Ost Cx to Prox Cx lesion is 50% stenosed.  The left ventricular systolic function is normal.  LV end diastolic pressure is mildly elevated.  The left ventricular ejection fraction is 50-55% by visual estimate.  There is no aortic valve stenosis.  A drug-eluting stent was successfully placed using a STENT RESOLUTE ONYX 2.5X22.  A drug-eluting stent was successfully placed using a STENT RESOLUTE ONYX 2.5X26.  A drug-eluting stent was successfully placed using a STENT RESOLUTE ONYX 2.5X8.  Restart Eliquis tomorrow.  Aspirin for only 1 week since I am concerned about her bleeding risk.  Clopidogrel for 1 year if tolerated from a bleeding standpoint.  At minimum, would want at least 6 months of clopidogrel.     ECHOCARDIOGRAM COMPLETE  Result Date: 03/10/2020    ECHOCARDIOGRAM REPORT   Patient Name:   [name] Date of Exam: 03/10/2020 Medical Rec #:  [id number]      Height:       61.0 in Accession #:    [id number]     Weight:       180.3 lb Date of Birth:  1940-06-09      BSA:          1.807 m Patient Age:    80 years       BP:           147/65 mmHg Patient Gender: F              HR:           71 bpm. Exam Location:  Inpatient Procedure: 2D Echo, Cardiac Doppler and Color Doppler Indications:    NSTEMI  History:        Patient has prior history of Echocardiogram examinations, most                 recent 12/11/2019. Acute MI and CAD, Arrythmias:Atrial                 Fibrillation; Risk Factors:Hypertension, Dyslipidemia and                 Diabetes.  Sonographer:    Dustin Flock Referring Phys: [place]  1. Anterior and basal to mid anterolateral hypokinesis. Posterolateral hypokinesis. Left ventricular  ejection fraction, by estimation, is 55 to 60%. The left ventricle has normal function. The left ventricle demonstrates regional wall motion abnormalities (see scoring diagram/findings for description). There is mild concentric left ventricular hypertrophy. Left ventricular  diastolic parameters are consistent with Grade II diastolic dysfunction (pseudonormalization). Elevated left ventricular  end-diastolic pressure.  2. Right ventricular systolic function is normal. The right ventricular size is normal. There is moderately elevated pulmonary artery systolic pressure.  3. Right atrial size was mildly dilated.  4. The mitral valve is normal in structure. Mild mitral valve regurgitation. No evidence of mitral stenosis.  5. Tricuspid valve regurgitation is moderate to severe.  6. The aortic valve is normal in structure. There is mild calcification of the aortic valve. There is mild thickening of the aortic valve. Aortic valve regurgitation is moderate. No aortic stenosis is present. Aortic regurgitation PHT measures 457 msec.  7. The inferior vena cava is normal in size with greater than 50% respiratory variability, suggesting right atrial pressure of 3 mmHg. FINDINGS  Left Ventricle: Anterior and basal to mid anterolateral hypokinesis. Posterolateral hypokinesis. Left ventricular ejection fraction, by estimation, is 55 to 60%. The left ventricle has normal function. The left ventricle demonstrates regional wall motion abnormalities. The left ventricular internal cavity size was normal in size. There is mild concentric left ventricular hypertrophy. Left ventricular diastolic parameters are consistent with Grade II diastolic dysfunction (pseudonormalization). Elevated left ventricular end-diastolic pressure. Right Ventricle: The right ventricular size is normal. No increase in right ventricular wall thickness. Right ventricular systolic function is normal. There is moderately elevated pulmonary artery systolic pressure. The tricuspid regurgitant velocity is 3.52 m/s, and with an assumed right atrial pressure of 3 mmHg, the estimated right ventricular systolic pressure is 97.6 mmHg. Left Atrium: Left atrial size was normal in size. Right Atrium: Right atrial size was mildly dilated.  Pericardium: There is no evidence of pericardial effusion. Mitral Valve: The mitral valve is normal in structure. Mild mitral annular calcification. Mild mitral valve regurgitation. No evidence of mitral valve stenosis. Tricuspid Valve: The tricuspid valve is normal in structure. Tricuspid valve regurgitation is moderate to severe. No evidence of tricuspid stenosis. Aortic Valve: The aortic valve is normal in structure. There is mild calcification of the aortic valve. There is mild thickening of the aortic valve. Aortic valve regurgitation is moderate. Aortic regurgitation PHT measures 457 msec. No aortic stenosis is present. Aortic valve mean gradient measures 7.0 mmHg. Aortic valve peak gradient measures 12.1 mmHg. Aortic valve area, by VTI measures 1.22 cm. Pulmonic Valve: The pulmonic valve was normal in structure. Pulmonic valve regurgitation is trivial. No evidence of pulmonic stenosis. Aorta: The aortic root is normal in size and structure. Venous: The inferior vena cava is normal in size with greater than 50% respiratory variability, suggesting right atrial pressure of 3 mmHg. IAS/Shunts: No atrial level shunt detected by color flow Doppler.  LEFT VENTRICLE PLAX 2D LVIDd:         3.90 cm  Diastology LVIDs:         2.50 cm  LV e' medial:    5.55 cm/s LV PW:         1.20 cm  LV E/e' medial:  25.4 LV IVS:        1.20 cm  LV e' lateral:   4.57 cm/s LVOT diam:     1.80 cm  LV E/e' lateral: 30.9 LV SV:         44 LV SV Index:   24 LVOT  Area:     2.54 cm  RIGHT VENTRICLE RV Basal diam:  2.90 cm RV S prime:     7.18 cm/s TAPSE (M-mode): 2.2 cm LEFT ATRIUM             Index       RIGHT ATRIUM           Index LA diam:        3.40 cm 1.88 cm/m  RA Area:     19.10 cm LA Vol (A2C):   49.1 ml 27.16 ml/m RA Volume:   57.50 ml  31.81 ml/m LA Vol (A4C):   45.6 ml 25.23 ml/m LA Biplane Vol: 48.3 ml 26.72 ml/m  AORTIC VALVE AV Area (Vmax):    1.08 cm AV Area (Vmean):   1.01 cm AV Area (VTI):     1.22 cm AV Vmax:            174.00 cm/s AV Vmean:          124.000 cm/s AV VTI:            0.362 m AV Peak Grad:      12.1 mmHg AV Mean Grad:      7.0 mmHg LVOT Vmax:         73.70 cm/s LVOT Vmean:        49.100 cm/s LVOT VTI:          0.174 m LVOT/AV VTI ratio: 0.48 AI PHT:            457 msec  AORTA Ao Root diam: 2.70 cm MITRAL VALVE                TRICUSPID VALVE MV Area (PHT): 3.91 cm     TR Peak grad:   49.6 mmHg MV Decel Time: 194 msec     TR Vmax:        352.00 cm/s MV E velocity: 141.00 cm/s MV A velocity: 74.70 cm/s   SHUNTS MV E/A ratio:  1.89         Systemic VTI:  0.17 m                             Systemic Diam: 1.80 cm Skeet Latch MD Electronically signed by Skeet Latch MD Signature Date/Time: 03/10/2020/4:56:58 PM    Final     Cardiac Studies   CATH/PCI 03/09/2020  Prior LAD stents were patient.  Ramus-1 lesion is 95% stenosed. A drug-eluting stent was successfully placed using a STENT RESOLUTE ONYX 2.5X22, postdilated to 2.75 mm. T  Post intervention, there is a 0% residual stenosis.  Ramus-2 lesion is 50% stenosed in tortuous segment distal to the stent.  Post intervention, there is a 0% residual stenosis.  Mid RCA-1 lesion is 75% stenosed. A drug-eluting stent was successfully placed using a STENT RESOLUTE ONYX 2.5X26, postdilated to 2.75 mm.  Post intervention, there is a 0% residual stenosis.  Mid RCA-2 lesion is 75% stenosed, likely edge dissection in tortuous segment. A drug-eluting stent was successfully placed using a STENT RESOLUTE ONYX 2.5X8, A drug-eluting stent was successfully placed using a STENT RESOLUTE ONYX 2.5X8, postdilated to 2.75 mm.  Ost Cx to Prox Cx lesion is 50% stenosed.  The left ventricular systolic function is normal.  LV end diastolic pressure is mildly elevated.  The left ventricular ejection fraction is 50-55% by visual estimate.  There is no aortic valve stenosis.  A drug-eluting stent was successfully placed  using a STENT RESOLUTE ONYX  2.5X22.  A drug-eluting stent was successfully placed using a STENT RESOLUTE ONYX 2.5X26.  A drug-eluting stent was successfully placed using a STENT RESOLUTE ONYX 2.5X8.  Restart Eliquis tomorrow. Aspirin for only 1 week since I am concerned about her bleeding risk. Clopidogrel for 1 year if tolerated from a bleeding standpoint. At minimum, would want at least 6 months of clopidogrel.     Intervention   Echo: 03/10/20  IMPRESSIONS    1. Anterior and basal to mid anterolateral hypokinesis. Posterolateral  hypokinesis. Left ventricular ejection fraction, by estimation, is 55 to  60%. The left ventricle has normal function. The left ventricle  demonstrates regional wall motion  abnormalities (see scoring diagram/findings for description). There is  mild concentric left ventricular hypertrophy. Left ventricular diastolic  parameters are consistent with Grade II diastolic dysfunction  (pseudonormalization). Elevated left ventricular  end-diastolic pressure.  2. Right ventricular systolic function is normal. The right ventricular  size is normal. There is moderately elevated pulmonary artery systolic  pressure.  3. Right atrial size was mildly dilated.  4. The mitral valve is normal in structure. Mild mitral valve  regurgitation. No evidence of mitral stenosis.  5. Tricuspid valve regurgitation is moderate to severe.  6. The aortic valve is normal in structure. There is mild calcification  of the aortic valve. There is mild thickening of the aortic valve. Aortic  valve regurgitation is moderate. No aortic stenosis is present. Aortic  regurgitation PHT measures 457 msec.  7. The inferior vena cava is normal in size with greater than 50%  respiratory variability, suggesting right atrial pressure of 3 mmHg.   Patient Profile     80 y.o. female with PMH ofJehovah's Witnesswith a hx ofPAF(amiodarone discontinued due to hypothyroidism, continueson Eliquis),  CADstatus post DES to the LAD in 2010 as well as 2017, hypertension, hyperlipidemia, type 2 diabetes mellitus, and PSVTwhowasfor the evaluation ofchest painat the request of Dr. Roderic Palau.  Assessment & Plan    1. NSTEMI: High-sensitivity troponin increased to [address]. Underwent cardiac cath with  two-vessel 3 lesion PCI with stenting of the ramus intermediate and stenting of the mid RCA. -- Recommendations by Dr.Varanasi,     triple drug therapy for 1 week and then Plavix/Eliquis for 1 year depending upon bleed history.  No recurrent chest pain. Has worked well with cardiac rehab.   2.  PAF: Patient developed atrial fibrillation 1/18 and was on amiodarone bolus plus infusion with conversion to SR.  -- Continue diltiazem 240 mg and metoprolol 100 mg twice a day. Eliquis was resumed yesterday.   3.  Hypertension: Blood pressure improved but still slightly elevated at times. -- continue metoprolol tartrate 100 mg twice a day, Diltiazem 240mg  daily and losartan 50mg  daily.  5. Hyperlipidemia: Now on rosuvastatin, reports issues with myalgias with statins in the past.  -- will attempt to increase Crestor to 20mg  daily.  6. Diabetes mellitus: On metformin prior to admission. -- Hgb A1c 6.5  For questions or updates, please contact [place] Please consult [email or web address] for contact info under   Signed, Reino Bellis, NP  03/11/2020, 11:43 AM     Patient seen and examined. Agree with assessment and plan.  Well without recurrent chest pain.  Ambulating with cardiac rehab without shortness of breath.  Maintaining sinus rhythm with pulse in the 60s.  Blood pressure is stable.  Okay to discharge today with plans for triple drug therapy for 1 week and then Plavix/Eliquis per Dr.  [place].    Troy Sine, MD, [facility] 03/11/2020 12:26 PM

## 2020-03-11 NOTE — Progress Notes (Signed)
CARDIAC REHAB PHASE I   PRE:  Rate/Rhythm: 71 SR  BP:  Supine: 130/64  Sitting:   Standing:    SaO2:   MODE:  Ambulation: 400 ft   POST:  Rate/Rhythm: 96 SR few PVCs  BP:  Supine:   Sitting: 189/84, 183/74  Standing:     SaO2:  0949-1013 Pt walked 400 ft on RA with steady gait. Started out without AD but pt's hip started to bother her. Stated she does better if she has something to hold to. Gave rollator and pt tolerated well.  To recliner after walk. Notified cardiology NP of elevated BP after walks.  Stressed importance of plavix with stent. Encouraged to walk as tolerated with hip issues.   Graylon Good, RN BSN  03/11/2020 10:10 AM

## 2020-03-11 NOTE — Discharge Instructions (Signed)

## 2020-03-11 NOTE — Discharge Summary (Signed)
Discharge Summary    Patient ID: [name] MRN: [id number]; DOB: [birth year]  Admit date: 03/08/2020 Discharge date: 03/11/2020  Primary Care Provider: Ivy Lynn, NP  Primary Cardiologist: Rozann Lesches, MD  Primary Electrophysiologist:  Thompson Grayer, MD   Discharge Diagnoses    Principal Problem:   Non-ST elevation (NSTEMI) myocardial infarction [facility]) Active Problems:   Mixed hyperlipidemia   Essential hypertension   Chest pain   Type 2 diabetes mellitus without complication ([place])   Paroxysmal atrial fibrillation ([place])   Unstable angina [facility])    Diagnostic Studies/Procedures    Cath: 03/09/20   Prior LAD stents were patient.  Ramus-1 lesion is 95% stenosed. A drug-eluting stent was successfully placed using a STENT RESOLUTE ONYX 2.5X22, postdilated to 2.75 mm. T  Post intervention, there is a 0% residual stenosis.  Ramus-2 lesion is 50% stenosed in tortuous segment distal to the stent.  Post intervention, there is a 0% residual stenosis.  Mid RCA-1 lesion is 75% stenosed. A drug-eluting stent was successfully placed using a STENT RESOLUTE ONYX 2.5X26, postdilated to 2.75 mm.  Post intervention, there is a 0% residual stenosis.  Mid RCA-2 lesion is 75% stenosed, likely edge dissection in tortuous segment. A drug-eluting stent was successfully placed using a STENT RESOLUTE ONYX 2.5X8, A drug-eluting stent was successfully placed using a STENT RESOLUTE ONYX 2.5X8, postdilated to 2.75 mm.  Ost Cx to Prox Cx lesion is 50% stenosed.  The left ventricular systolic function is normal.  LV end diastolic pressure is mildly elevated.  The left ventricular ejection fraction is 50-55% by visual estimate.  There is no aortic valve stenosis.  A drug-eluting stent was successfully placed using a STENT RESOLUTE ONYX 2.5X22.  A drug-eluting stent was successfully placed using a STENT RESOLUTE ONYX 2.5X26.  A drug-eluting stent was successfully placed using a  STENT RESOLUTE ONYX 2.5X8.  Restart Eliquis tomorrow. Aspirin for only 1 week since I am concerned about her bleeding risk. Clopidogrel for 1 year if tolerated from a bleeding standpoint. At minimum, would want at least 6 months of clopidogrel.     Intervention   Echo: 03/10/20  IMPRESSIONS    1. Anterior and basal to mid anterolateral hypokinesis. Posterolateral  hypokinesis. Left ventricular ejection fraction, by estimation, is 55 to  60%. The left ventricle has normal function. The left ventricle  demonstrates regional wall motion  abnormalities (see scoring diagram/findings for description). There is  mild concentric left ventricular hypertrophy. Left ventricular diastolic  parameters are consistent with Grade II diastolic dysfunction  (pseudonormalization). Elevated left ventricular  end-diastolic pressure.  2. Right ventricular systolic function is normal. The right ventricular  size is normal. There is moderately elevated pulmonary artery systolic  pressure.  3. Right atrial size was mildly dilated.  4. The mitral valve is normal in structure. Mild mitral valve  regurgitation. No evidence of mitral stenosis.  5. Tricuspid valve regurgitation is moderate to severe.  6. The aortic valve is normal in structure. There is mild calcification  of the aortic valve. There is mild thickening of the aortic valve. Aortic  valve regurgitation is moderate. No aortic stenosis is present. Aortic  regurgitation PHT measures 457 msec.  7. The inferior vena cava is normal in size with greater than 50%  respiratory variability, suggesting right atrial pressure of 3 mmHg.  _____________   History of Present Illness     [name] is a 80 y.o. female with PMH of [employer] with a hx of  PAF (amiodarone discontinued due to hypothyroidism, continues on Eliquis), CAD status post DES to the LAD in 2010 as well as 2017, hypertension, hyperlipidemia, type 2 diabetes  mellitus, and PSVT who presented with chest pain and seen at [facility] by Dr. Domenic Polite.   [name] presented to the [facility] ER reporting recurrent chest pain over the last week prior to admission, much worse in the early morning hours when she awoke around 4 AM. She described upper chest and shoulder discomfort that has been nitroglycerin responsive over the last week. She stated that she had been compliant with her regular medications.  She had a video encounter with Dr. Rayann Heman 02/02/20 for symptomatic recurrent AF, also reporting DOE. She had previously been taken off amiodarone due to hypothyroidism and other treatment options are being considered. He recommended ILR and also follow-up with general cardiology.  Patient has been placed on IV nitroglycerin and IV heparin, symptoms improving but still with residual chest discomfort. She is otherwise hemodynamically stable, in fact hypertensive. ECG shows no acute ST segment changes. Initial high-sensitivity troponin I level is 35. Stable, mild cardiomegaly with no acute process. Given concern for ACS she was transferred to [facility] for further management with cardiac cath.   Hospital Course     1. NSTEMI:High-sensitivity troponin increased to 25[address]. Underwent cardiac cath with  two-vessel 3 lesion PCI with stenting of the ramus intermediate and stenting of the mid RCA. -- Recommendations by Dr.Varanasi to continue on triple drug therapy for 1 week and then Plavix/Eliquis for 1 year depending upon bleed history. No recurrent chest pain. Has worked well with cardiac rehab.   2.PAF: Patient developed atrial fibrillation 1/18 and was on amiodarone bolus plus infusion with conversion to SR.  -- Continue diltiazem 240 mg and metoprolol 100 mg twice a day. Eliquis was resumed prior to discharge without complications.   3. Hypertension: Blood pressure improved but still slightly elevated at times. -- continue metoprolol tartrate 100 mg twice a day,  Diltiazem 240mg  daily and losartan 50mg  daily. -- have asked that she monitor her BP at home, bring to follow up  5.Hyperlipidemia: Now on rosuvastatin,reported issues with myalgias with statins in the past.  -- will attempt to increase Crestor to 20mg  daily at discharge. She will inform us at follow up if she is tolerating.  6. Diabetes mellitus: On metformin prior to admission, resume at discharge. -- Hgb A1c 6.5  Did the patient have an acute coronary syndrome (MI, NSTEMI, STEMI, etc) this admission?:  Yes                               AHA/ACC Clinical Performance & Quality Measures: 1. Aspirin prescribed? - Yes 2. ADP Receptor Inhibitor (Plavix/Clopidogrel, Brilinta/Ticagrelor or Effient/Prasugrel) prescribed (includes medically managed patients)? - Yes 3. Beta Blocker prescribed? - Yes 4. High Intensity Statin (Lipitor 40-80mg  or Crestor 20-40mg ) prescribed? - Yes 5. EF assessed during THIS hospitalization? - Yes 6. For EF <40%, was ACEI/ARB prescribed? - Not Applicable (EF >/= [id number]) 7. For EF <40%, Aldosterone Antagonist (Spironolactone or Eplerenone) prescribed? - Not Applicable (EF >/= [id number]) 8. Cardiac Rehab Phase II ordered (including medically managed patients)? - Yes       _____________  Discharge Vitals Blood pressure 130/64, pulse 73, temperature 98.5 F (36.9 C), temperature source Oral, resp. rate 19, height 5\' 1"  (1.549 m), weight 79.9 kg, SpO2 99 %.  Filed Weights   03/09/20 0419 03/10/20 0500  03/11/20 0625  Weight: 83.6 kg 81.8 kg 79.9 kg    Labs & Radiologic Studies    CBC Recent Labs    03/10/20 0230 03/11/20 0325  WBC 9.3 9.3  HGB 11.3* 11.1*  HCT 33.0* 32.0*  MCV 89.7 91.2  PLT 177 123456   Basic Metabolic Panel Recent Labs    03/10/20 0230 03/11/20 0325  NA 134* 138  K 3.4* 3.7  CL 100 105  CO2 24 23  GLUCOSE 162* 147*  BUN 9 9  CREATININE 0.88 0.86  CALCIUM 8.9 8.8*  MG  --  1.9   Liver Function Tests No results for input(s): AST,  ALT, ALKPHOS, BILITOT, PROT, ALBUMIN in the last 72 hours. No results for input(s): LIPASE, AMYLASE in the last 72 hours. High Sensitivity Troponin:   Recent Labs  Lab 03/08/20 1112 03/08/20 1316 03/08/20 1610 03/08/20 2159  TROPONINIHS 34* 57* 1,243* 25,648*    BNP Invalid input(s): POCBNP D-Dimer No results for input(s): DDIMER in the last 72 hours. Hemoglobin A1C Recent Labs    03/10/20 0230  HGBA1C 6.5*   Fasting Lipid Panel Recent Labs    03/09/20 0209  CHOL 124  HDL 51  LDLCALC 61  TRIG 58  CHOLHDL 2.4   Thyroid Function Tests No results for input(s): TSH, T4TOTAL, T3FREE, THYROIDAB in the last 72 hours.  Invalid input(s): FREET3 _____________  CARDIAC CATHETERIZATION  Result Date: 03/09/2020  Prior LAD stents were patient.  Ramus-1 lesion is 95% stenosed. A drug-eluting stent was successfully placed using a STENT RESOLUTE ONYX 2.5X22, postdilated to 2.75 mm. T  Post intervention, there is a 0% residual stenosis.  Ramus-2 lesion is 50% stenosed in tortuous segment distal to the stent.  Post intervention, there is a 0% residual stenosis.  Mid RCA-1 lesion is 75% stenosed. A drug-eluting stent was successfully placed using a STENT RESOLUTE ONYX 2.5X26, postdilated to 2.75 mm.  Post intervention, there is a 0% residual stenosis.  Mid RCA-2 lesion is 75% stenosed, likely edge dissection in tortuous segment. A drug-eluting stent was successfully placed using a STENT RESOLUTE ONYX 2.5X8, A drug-eluting stent was successfully placed using a STENT RESOLUTE ONYX 2.5X8, postdilated to 2.75 mm.  Ost Cx to Prox Cx lesion is 50% stenosed.  The left ventricular systolic function is normal.  LV end diastolic pressure is mildly elevated.  The left ventricular ejection fraction is 50-55% by visual estimate.  There is no aortic valve stenosis.  A drug-eluting stent was successfully placed using a STENT RESOLUTE ONYX 2.5X22.  A drug-eluting stent was successfully placed using  a STENT RESOLUTE ONYX 2.5X26.  A drug-eluting stent was successfully placed using a STENT RESOLUTE ONYX 2.5X8.  Restart Eliquis tomorrow.  Aspirin for only 1 week since I am concerned about her bleeding risk.  Clopidogrel for 1 year if tolerated from a bleeding standpoint.  At minimum, would want at least 6 months of clopidogrel.     DG Chest Port 1 View  Result Date: 03/08/2020 CLINICAL DATA:  Left-sided shoulder pain beginning this morning. Atrial fibrillation. Diabetes. EXAM: PORTABLE CHEST 1 VIEW COMPARISON:  11/11/2018 FINDINGS: Stable mild cardiomegaly. Both lungs are clear. No evidence of pleural effusion. IMPRESSION: Stable mild cardiomegaly. No active lung disease. Electronically Signed   By: Marlaine Hind M.D.   On: 03/08/2020 11:24   ECHOCARDIOGRAM COMPLETE  Result Date: 03/10/2020    ECHOCARDIOGRAM REPORT   Patient Name:   ANGELICAMARIA TAO Hair Date of Exam: 03/10/2020 Medical Rec #:  [id number]  Height:       61.0 in Accession #:    [id number]     Weight:       180.3 lb Date of Birth:  06-25-1940      BSA:          1.807 m Patient Age:    45 years       BP:           147/65 mmHg Patient Gender: F              HR:           71 bpm. Exam Location:  Inpatient Procedure: 2D Echo, Cardiac Doppler and Color Doppler Indications:    NSTEMI  History:        Patient has prior history of Echocardiogram examinations, most                 recent 12/11/2019. Acute MI and CAD, Arrythmias:Atrial                 Fibrillation; Risk Factors:Hypertension, Dyslipidemia and                 Diabetes.  Sonographer:    Dustin Flock Referring Phys: [place]  1. Anterior and basal to mid anterolateral hypokinesis. Posterolateral hypokinesis. Left ventricular ejection fraction, by estimation, is 55 to 60%. The left ventricle has normal function. The left ventricle demonstrates regional wall motion abnormalities (see scoring diagram/findings for description). There is mild concentric left  ventricular hypertrophy. Left ventricular diastolic parameters are consistent with Grade II diastolic dysfunction (pseudonormalization). Elevated left ventricular  end-diastolic pressure.  2. Right ventricular systolic function is normal. The right ventricular size is normal. There is moderately elevated pulmonary artery systolic pressure.  3. Right atrial size was mildly dilated.  4. The mitral valve is normal in structure. Mild mitral valve regurgitation. No evidence of mitral stenosis.  5. Tricuspid valve regurgitation is moderate to severe.  6. The aortic valve is normal in structure. There is mild calcification of the aortic valve. There is mild thickening of the aortic valve. Aortic valve regurgitation is moderate. No aortic stenosis is present. Aortic regurgitation PHT measures 457 msec.  7. The inferior vena cava is normal in size with greater than 50% respiratory variability, suggesting right atrial pressure of 3 mmHg. FINDINGS  Left Ventricle: Anterior and basal to mid anterolateral hypokinesis. Posterolateral hypokinesis. Left ventricular ejection fraction, by estimation, is 55 to 60%. The left ventricle has normal function. The left ventricle demonstrates regional wall motion abnormalities. The left ventricular internal cavity size was normal in size. There is mild concentric left ventricular hypertrophy. Left ventricular diastolic parameters are consistent with Grade II diastolic dysfunction (pseudonormalization). Elevated left ventricular end-diastolic pressure. Right Ventricle: The right ventricular size is normal. No increase in right ventricular wall thickness. Right ventricular systolic function is normal. There is moderately elevated pulmonary artery systolic pressure. The tricuspid regurgitant velocity is 3.52 m/s, and with an assumed right atrial pressure of 3 mmHg, the estimated right ventricular systolic pressure is 0000000 mmHg. Left Atrium: Left atrial size was normal in size. Right Atrium:  Right atrial size was mildly dilated. Pericardium: There is no evidence of pericardial effusion. Mitral Valve: The mitral valve is normal in structure. Mild mitral annular calcification. Mild mitral valve regurgitation. No evidence of mitral valve stenosis. Tricuspid Valve: The tricuspid valve is normal in structure. Tricuspid valve regurgitation is moderate to severe. No evidence of tricuspid stenosis. Aortic Valve: The  aortic valve is normal in structure. There is mild calcification of the aortic valve. There is mild thickening of the aortic valve. Aortic valve regurgitation is moderate. Aortic regurgitation PHT measures 457 msec. No aortic stenosis is present. Aortic valve mean gradient measures 7.0 mmHg. Aortic valve peak gradient measures 12.1 mmHg. Aortic valve area, by VTI measures 1.22 cm. Pulmonic Valve: The pulmonic valve was normal in structure. Pulmonic valve regurgitation is trivial. No evidence of pulmonic stenosis. Aorta: The aortic root is normal in size and structure. Venous: The inferior vena cava is normal in size with greater than 50% respiratory variability, suggesting right atrial pressure of 3 mmHg. IAS/Shunts: No atrial level shunt detected by color flow Doppler.  LEFT VENTRICLE PLAX 2D LVIDd:         3.90 cm  Diastology LVIDs:         2.50 cm  LV e' medial:    5.55 cm/s LV PW:         1.20 cm  LV E/e' medial:  25.4 LV IVS:        1.20 cm  LV e' lateral:   4.57 cm/s LVOT diam:     1.80 cm  LV E/e' lateral: 30.9 LV SV:         44 LV SV Index:   24 LVOT Area:     2.54 cm  RIGHT VENTRICLE RV Basal diam:  2.90 cm RV S prime:     7.18 cm/s TAPSE (M-mode): 2.2 cm LEFT ATRIUM             Index       RIGHT ATRIUM           Index LA diam:        3.40 cm 1.88 cm/m  RA Area:     19.10 cm LA Vol (A2C):   49.1 ml 27.16 ml/m RA Volume:   57.50 ml  31.81 ml/m LA Vol (A4C):   45.6 ml 25.23 ml/m LA Biplane Vol: 48.3 ml 26.72 ml/m  AORTIC VALVE AV Area (Vmax):    1.08 cm AV Area (Vmean):   1.01 cm AV  Area (VTI):     1.22 cm AV Vmax:           174.00 cm/s AV Vmean:          124.000 cm/s AV VTI:            0.362 m AV Peak Grad:      12.1 mmHg AV Mean Grad:      7.0 mmHg LVOT Vmax:         73.70 cm/s LVOT Vmean:        49.100 cm/s LVOT VTI:          0.174 m LVOT/AV VTI ratio: 0.48 AI PHT:            457 msec  AORTA Ao Root diam: 2.70 cm MITRAL VALVE                TRICUSPID VALVE MV Area (PHT): 3.91 cm     TR Peak grad:   49.6 mmHg MV Decel Time: 194 msec     TR Vmax:        352.00 cm/s MV E velocity: 141.00 cm/s MV A velocity: 74.70 cm/s   SHUNTS MV E/A ratio:  1.89         Systemic VTI:  0.17 m  Systemic Diam: 1.80 cm Skeet Latch MD Electronically signed by Skeet Latch MD Signature Date/Time: 03/10/2020/4:56:58 PM    Final    Disposition   Pt is being discharged home today in good condition.  Follow-up Plans & Appointments     Follow-up Information    Satira Sark, MD Follow up on 03/15/2020.   Specialty: Cardiology Why: at 1:40pm for your follow up appt Contact information: [place] [address] [phone]              Discharge Instructions    Amb Referral to Cardiac Rehabilitation   Complete by: As directed    Diagnosis:  Coronary Stents NSTEMI     After initial evaluation and assessments completed: Virtual Based Care may be provided alone or in conjunction with Phase 2 Cardiac Rehab based on patient barriers.: Yes   Call MD for:  difficulty breathing, headache or visual disturbances   Complete by: As directed    Call MD for:  persistant dizziness or light-headedness   Complete by: As directed    Call MD for:  redness, tenderness, or signs of infection (pain, swelling, redness, odor or green/yellow discharge around incision site)   Complete by: As directed    Diet - low sodium heart healthy   Complete by: As directed    Discharge instructions   Complete by: As directed    Radial Site Care Refer to this sheet  in the next few weeks. These instructions provide you with information on caring for yourself after your procedure. Your caregiver may also give you more specific instructions. Your treatment has been planned according to current medical practices, but problems sometimes occur. Call your caregiver if you have any problems or questions after your procedure. HOME CARE INSTRUCTIONS You may shower the day after the procedure.Remove the bandage (dressing) and gently wash the site with plain soap and water.Gently pat the site dry.  Do not apply powder or lotion to the site.  Do not submerge the affected site in water for 3 to 5 days.  Inspect the site at least twice daily.  Do not flex or bend the affected arm for 24 hours.  No lifting over 5 pounds (2.3 kg) for 5 days after your procedure.  Do not drive home if you are discharged the same day of the procedure. Have someone else drive you.  You may drive 24 hours after the procedure unless otherwise instructed by your caregiver.  What to expect: Any bruising will usually fade within 1 to 2 weeks.  Blood that collects in the tissue (hematoma) may be painful to the touch. It should usually decrease in size and tenderness within 1 to 2 weeks.  SEEK IMMEDIATE MEDICAL CARE IF: You have unusual pain at the radial site.  You have redness, warmth, swelling, or pain at the radial site.  You have drainage (other than a small amount of blood on the dressing).  You have chills.  You have a fever or persistent symptoms for more than 72 hours.  You have a fever and your symptoms suddenly get worse.  Your arm becomes pale, cool, tingly, or numb.  You have heavy bleeding from the site. Hold pressure on the site.   PLEASE DO NOT MISS ANY DOSES OF YOUR PLAVIX!!!!! Also keep a log of you blood pressures and bring back to your follow up appt. Please call the office with any questions.   Patients taking blood thinners should generally stay away from medicines  like  ibuprofen, Advil, Motrin, naproxen, and Aleve due to risk of stomach bleeding. You may take Tylenol as directed or talk to your primary doctor about alternatives.   PLEASE ENSURE THAT YOU DO NOT RUN OUT OF YOUR PLAVIX. This medication is very important to remain on for at least one year. IF you have issues obtaining this medication due to cost please CALL the office 3-5 business days prior to running out in order to prevent missing doses of this medication.   You will only need to take aspirin until 03/17/20!!!!   Increase activity slowly   Complete by: As directed    No wound care   Complete by: As directed       Discharge Medications   Allergies as of 03/11/2020   No Known Allergies     Medication List    STOP taking these medications   ibuprofen 200 MG tablet Commonly known as: ADVIL     TAKE these medications   acetaminophen 650 MG CR tablet Commonly known as: TYLENOL Take 1,300 mg by mouth every 8 (eight) hours as needed for pain.   amitriptyline 10 MG tablet Commonly known as: ELAVIL Take 1 tablet (10 mg total) by mouth at bedtime.   apixaban 5 MG Tabs tablet Commonly known as: Eliquis Take 1 tablet (5 mg total) by mouth 2 (two) times daily.   aspirin 81 MG chewable tablet Chew 1 tablet (81 mg total) by mouth daily for 5 days. Start taking on: March 12, 2020   bimatoprost 0.01 % Soln Commonly known as: LUMIGAN Place 1 drop into both eyes at bedtime.   clopidogrel 75 MG tablet Commonly known as: PLAVIX Take 1 tablet (75 mg total) by mouth daily with breakfast. Start taking on: March 12, 2020   diclofenac Sodium 1 % Gel Commonly known as: Voltaren Apply 2 g topically 4 (four) times daily. What changed: additional instructions   diltiazem 240 MG 24 hr capsule Commonly known as: CARDIZEM CD Take 1 capsule (240 mg total) by mouth daily. Start taking on: March 12, 2020 What changed:   medication strength  See the new instructions.   Garlic 123XX123  MG Tabs Take 1 tablet by mouth daily.   HYDROcodone-acetaminophen 10-325 MG tablet Commonly known as: NORCO Take 1 tablet by mouth 4 (four) times daily as needed. For pain.   levothyroxine 75 MCG tablet Commonly known as: SYNTHROID Take 1 tablet (75 mcg total) by mouth daily before breakfast.   losartan 50 MG tablet Commonly known as: COZAAR Take 1 tablet (50 mg total) by mouth daily. Start taking on: March 12, 2020 What changed:   medication strength  See the new instructions.   MAGNESIUM CARBONATE PO Take 1 tablet by mouth daily. 400 mg   Menthol (Topical Analgesic) 4 % Gel Apply 1 application topically 4 (four) times daily as needed (for pain).   metFORMIN 500 MG 24 hr tablet Commonly known as: GLUCOPHAGE-XR Take 1 tablet by mouth daily.   metoprolol tartrate 100 MG tablet Commonly known as: LOPRESSOR Take 1 tablet (100 mg total) by mouth 2 (two) times daily.   nitroGLYCERIN 0.4 MG SL tablet Commonly known as: NITROSTAT Place 0.4 mg under the tongue every 5 (five) minutes as needed for chest pain.   pantoprazole 40 MG tablet Commonly known as: PROTONIX TAKE ONE (1) TABLET EACH DAY   promethazine 25 MG tablet Commonly known as: PHENERGAN 1 tablet as needed.   rosuvastatin 20 MG tablet Commonly known as: CRESTOR Take  1 tablet (20 mg total) by mouth daily. Start taking on: March 12, 2020 What changed:   medication strength  See the new instructions.   timolol 0.5 % ophthalmic solution Commonly known as: TIMOPTIC Place 1 drop into both eyes daily.   traZODone 100 MG tablet Commonly known as: DESYREL Take 1 tablet by mouth at bedtime.   TUMS PO Take 2 tablets by mouth 4 (four) times daily as needed (for acid reflux/indigestion).   TURMERIC PO Take 1 tablet by mouth daily.   Ventolin HFA 108 (90 Base) MCG/ACT inhaler Generic drug: albuterol Inhale 1-2 puffs into the lungs every 4 (four) hours as needed for shortness of breath.   VITAMIN B 12  PO Take 1 tablet by mouth daily.   vitamin C 500 MG tablet Commonly known as: ASCORBIC ACID Take 500 mg by mouth daily.   Vitamin D 400 units capsule Take 400 Units by mouth daily.         Outstanding Labs/Studies   N/a   Duration of Discharge Encounter   Greater than 30 minutes including physician time.  Signed, Reino Bellis, NP 03/11/2020, 2:01 PM

## 2020-03-12 ENCOUNTER — Telehealth: Payer: Self-pay

## 2020-03-12 NOTE — Telephone Encounter (Signed)
Transition Care Management Follow-up Telephone Call   Date discharged?03/08/20   How have you been since you were released from the hospital? im getting better somewhat    Do you understand why you were in the hospital? yes   Do you understand the discharge instructions? yes   Where were you discharged to? im at home   Items Reviewed:  Medications reviewed: yes  Allergies reviewed: yes  Dietary changes reviewed: yes  Referrals reviewed: yes  Functional Questionnaire:      Any transportation issues/concerns?: no   Any patient concerns? no   Confirmed importance and date/time of follow-up visits scheduled yes  Provider Appointment booked with  Confirmed with patient if condition begins to worsen call PCP or go to the ER.  Patient was given the office number and encouraged to call back with question or concerns.  : yes

## 2020-03-15 ENCOUNTER — Other Ambulatory Visit: Payer: Self-pay

## 2020-03-15 ENCOUNTER — Ambulatory Visit (INDEPENDENT_AMBULATORY_CARE_PROVIDER_SITE_OTHER): Payer: Medicare Other | Admitting: Cardiology

## 2020-03-15 ENCOUNTER — Encounter: Payer: Self-pay | Admitting: Cardiology

## 2020-03-15 VITALS — BP 152/82 | HR 82 | Ht 61.0 in | Wt 183.0 lb

## 2020-03-15 DIAGNOSIS — I214 Non-ST elevation (NSTEMI) myocardial infarction: Secondary | ICD-10-CM

## 2020-03-15 DIAGNOSIS — I25119 Atherosclerotic heart disease of native coronary artery with unspecified angina pectoris: Secondary | ICD-10-CM | POA: Diagnosis not present

## 2020-03-15 DIAGNOSIS — I480 Paroxysmal atrial fibrillation: Secondary | ICD-10-CM | POA: Diagnosis not present

## 2020-03-15 MED ORDER — APIXABAN 5 MG PO TABS: 5.0000 mg | ORAL_TABLET | Freq: Two times a day (BID) | ORAL | 0 refills | Status: DC

## 2020-03-15 NOTE — Progress Notes (Signed)
Cardiology Office Note  Date: 03/15/2020   ID: [name], DOB 09-10-1940, MRN [id number]  PCP:  Ivy Lynn, NP  Cardiologist:  Rozann Lesches, MD Electrophysiologist:  Thompson Grayer, MD   Chief Complaint  Patient presents with  . Hospitalization Follow-up    History of Present Illness: [name] is a 80 y.o. female presenting for a post hospital follow-up.  She recently presented with NSTEMI with peak high-sensitivity troponin I 25,000 range, underwent cardiac catheterization and placement of DES to the ramus intermedius, DES x2 to the mid RCA by Dr. Irish Lack.  LVEF 55 to 60% by follow-up echocardiogram with wall motion abnormalities consistent with ischemic heart disease.  She was transiently in atrial fibrillation during her stay, converted to sinus rhythm on amiodarone.  She is here today with a family member for follow-up.  Chest pain improved, but she still feels somewhat short of breath moving around in her house.  No orthopnea or PND.  Has not been checking blood pressure since she got home.  We went over her medications in detail, outlined below.  She will be stopping aspirin at the end of this week.  Heart rate is regular today, she does not report any definite palpitations.  Past Medical History:  Diagnosis Date  . Anxiety   . Arthritis   . Bulging lumbar disc   . Chronic lower back pain   . Coronary atherosclerosis of native coronary artery    DES LAD 02/2008, DES LAD 02/2015, DES ramus and DES x2 mid RCA 02/2020  . Depression   . Dyslipidemia   . Essential hypertension   . Facial numbness   . Frequent headaches   . GERD (gastroesophageal reflux disease)   . Glaucoma   . Insomnia   . Paroxysmal atrial fibrillation [facility])    Diagnosed October 2019  . PSVT (paroxysmal supraventricular tachycardia) ([place])   . Refusal of blood transfusions as patient is Jehovah's Witness   . Thyroid disease   . Type 2 diabetes mellitus (HCC)    Borderline  . Vitamin D  deficiency     Past Surgical History:  Procedure Laterality Date  . CARDIAC CATHETERIZATION N/A 02/23/2015   Procedure: Left Heart Cath and Coronary Angiography;  Surgeon: Sherren Mocha, MD;  Location: [place] [facility];  Service: Cardiovascular;  Laterality: N/A;  . CARDIAC CATHETERIZATION N/A 02/23/2015   Procedure: Coronary Stent Intervention;  Surgeon: Sherren Mocha, MD;  Location: [place] [facility];  Service: Cardiovascular;  Laterality: N/A;  . CARDIAC CATHETERIZATION N/A 01/04/2016   Procedure: Left Heart Cath and Coronary Angiography;  Surgeon: Peter M Martinique, MD;  Location: [place] [facility];  Service: Cardiovascular;  Laterality: N/A;  . CATARACT EXTRACTION    . CORONARY ANGIOPLASTY  02/23/2015  . CORONARY ANGIOPLASTY WITH STENT PLACEMENT  2009  . CORONARY STENT INTERVENTION N/A 03/09/2020   Procedure: CORONARY STENT INTERVENTION;  Surgeon: Jettie Booze, MD;  Location: [place] [facility];  Service: Cardiovascular;  Laterality: N/A;  . DILATION AND CURETTAGE OF UTERUS    . LEFT HEART CATH AND CORONARY ANGIOGRAPHY N/A 03/09/2020   Procedure: LEFT HEART CATH AND CORONARY ANGIOGRAPHY;  Surgeon: Jettie Booze, MD;  Location: [place] [facility];  Service: Cardiovascular;  Laterality: N/A;  . TUBAL LIGATION      Current Outpatient Medications  Medication Sig Dispense Refill  . amitriptyline (ELAVIL) 10 MG tablet Take 1 tablet (10 mg total) by mouth at bedtime. 60 tablet 2  .  Ascorbic Acid (VITAMIN C) 500 MG tablet Take 500 mg by mouth daily.      Marland Kitchen aspirin 81 MG chewable tablet Chew 1 tablet (81 mg total) by mouth daily for 5 days. 5 tablet 0  . bimatoprost (LUMIGAN) 0.01 % SOLN Place 1 drop into both eyes at bedtime.    . Calcium Carbonate Antacid (TUMS PO) Take 2 tablets by mouth 4 (four) times daily as needed (for acid reflux/indigestion).     . Cholecalciferol (VITAMIN D) 400 UNITS capsule Take 400 Units by mouth daily.      . clopidogrel (PLAVIX) 75 MG tablet Take 1  tablet (75 mg total) by mouth daily with breakfast. 90 tablet 2  . Cyanocobalamin (VITAMIN B 12 PO) Take 1 tablet by mouth daily.    . diclofenac Sodium (VOLTAREN) 1 % GEL Apply 2 g topically 4 (four) times daily. (Patient taking differently: Apply 2 g topically 4 (four) times daily. As needed) 50 g 2  . diltiazem (CARDIZEM CD) 240 MG 24 hr capsule Take 1 capsule (240 mg total) by mouth daily. 90 capsule 0  . Garlic 409 MG TABS Take 1 tablet by mouth daily.    Marland Kitchen HYDROcodone-acetaminophen (NORCO) 10-325 MG tablet Take 1 tablet by mouth 4 (four) times daily as needed. For pain.    Marland Kitchen levothyroxine (SYNTHROID) 75 MCG tablet Take 1 tablet (75 mcg total) by mouth daily before breakfast. 90 tablet 0  . losartan (COZAAR) 50 MG tablet Take 1 tablet (50 mg total) by mouth daily. 90 tablet 0  . MAGNESIUM CARBONATE PO Take 1 tablet by mouth daily. 400 mg    . Menthol, Topical Analgesic, 4 % GEL Apply 1 application topically 4 (four) times daily as needed (for pain).     . metFORMIN (GLUCOPHAGE-XR) 500 MG 24 hr tablet Take 1 tablet by mouth daily.     . metoprolol tartrate (LOPRESSOR) 100 MG tablet Take 1 tablet (100 mg total) by mouth 2 (two) times daily. 180 tablet 3  . nitroGLYCERIN (NITROSTAT) 0.4 MG SL tablet Place 0.4 mg under the tongue every 5 (five) minutes as needed for chest pain.    . pantoprazole (PROTONIX) 40 MG tablet TAKE ONE (1) TABLET EACH DAY 90 tablet 2  . promethazine (PHENERGAN) 25 MG tablet 1 tablet as needed.    . rosuvastatin (CRESTOR) 20 MG tablet Take 1 tablet (20 mg total) by mouth daily. 30 tablet 0  . timolol (TIMOPTIC) 0.5 % ophthalmic solution Place 1 drop into both eyes daily.    . traZODone (DESYREL) 100 MG tablet Take 1 tablet by mouth at bedtime.     . TURMERIC PO Take 1 tablet by mouth daily.    . VENTOLIN HFA 108 (90 Base) MCG/ACT inhaler Inhale 1-2 puffs into the lungs every 4 (four) hours as needed for shortness of breath.    Marland Kitchen apixaban (ELIQUIS) 5 MG TABS tablet Take 1  tablet (5 mg total) by mouth 2 (two) times daily. 28 tablet 0   No current facility-administered medications for this visit.   Allergies:  Patient has no known allergies.   ROS: No syncope.  Physical Exam: VS:  BP (!) 152/82   Pulse 82   Ht 5\' 1"  (1.549 m)   Wt 183 lb (83 kg)   LMP  (LMP Unknown)   SpO2 92%   BMI 34.58 kg/m , BMI Body mass index is 34.58 kg/m.  Wt Readings from Last 3 Encounters:  03/15/20 183 lb (83 kg)  03/11/20 176 lb 3.2 oz (79.9 kg)  03/05/20 184 lb (83.5 kg)    General: Elderly woman, appears comfortable at rest. HEENT: Conjunctiva and lids normal, wearing a mask. Neck: Supple, no elevated JVP or carotid bruits, no thyromegaly. Lungs: Clear to auscultation, nonlabored breathing at rest. Cardiac: Regular rate and rhythm, no S3, soft systolic murmur, no pericardial rub. Extremities: No pitting edema.  ECG:  An ECG dated 03/11/2020 was personally reviewed today and demonstrated:  Sinus rhythm with poor R wave progression.  Recent Labwork: 03/03/2020: TSH 10.867 03/08/2020: ALT 16; AST 20 03/11/2020: BUN 9; Creatinine, Ser 0.86; Hemoglobin 11.1; Magnesium 1.9; Platelets 164; Potassium 3.7; Sodium 138     Component Value Date/Time   CHOL 124 03/09/2020 0209   TRIG 58 03/09/2020 0209   HDL 51 03/09/2020 0209   CHOLHDL 2.4 03/09/2020 0209   VLDL 12 03/09/2020 0209   LDLCALC 61 03/09/2020 0209    Other Studies Reviewed Today:  Cardiac catheterization 03/09/2020:  Prior LAD stents were patient.  Ramus-1 lesion is 95% stenosed. A drug-eluting stent was successfully placed using a STENT RESOLUTE ONYX 2.5X22, postdilated to 2.75 mm. T  Post intervention, there is a 0% residual stenosis.  Ramus-2 lesion is 50% stenosed in tortuous segment distal to the stent.  Post intervention, there is a 0% residual stenosis.  Mid RCA-1 lesion is 75% stenosed. A drug-eluting stent was successfully placed using a STENT RESOLUTE ONYX 2.5X26, postdilated to 2.75  mm.  Post intervention, there is a 0% residual stenosis.  Mid RCA-2 lesion is 75% stenosed, likely edge dissection in tortuous segment. A drug-eluting stent was successfully placed using a STENT RESOLUTE ONYX 2.5X8, A drug-eluting stent was successfully placed using a STENT RESOLUTE ONYX 2.5X8, postdilated to 2.75 mm.  Ost Cx to Prox Cx lesion is 50% stenosed.  The left ventricular systolic function is normal.  LV end diastolic pressure is mildly elevated.  The left ventricular ejection fraction is 50-55% by visual estimate.  There is no aortic valve stenosis.  A drug-eluting stent was successfully placed using a STENT RESOLUTE ONYX 2.5X22.  A drug-eluting stent was successfully placed using a STENT RESOLUTE ONYX 2.5X26.  A drug-eluting stent was successfully placed using a STENT RESOLUTE ONYX 2.5X8.   Restart Eliquis tomorrow.  Aspirin for only 1 week since I am concerned about her bleeding risk.  Clopidogrel for 1 year if tolerated from a bleeding standpoint.  At minimum, would want at least 6 months of clopidogrel.     Echocardiogram 03/10/2020: 1. Anterior and basal to mid anterolateral hypokinesis. Posterolateral  hypokinesis. Left ventricular ejection fraction, by estimation, is 55 to  60%. The left ventricle has normal function. The left ventricle  demonstrates regional wall motion  abnormalities (see scoring diagram/findings for description). There is  mild concentric left ventricular hypertrophy. Left ventricular diastolic  parameters are consistent with Grade II diastolic dysfunction  (pseudonormalization). Elevated left ventricular  end-diastolic pressure.  2. Right ventricular systolic function is normal. The right ventricular  size is normal. There is moderately elevated pulmonary artery systolic  pressure.  3. Right atrial size was mildly dilated.  4. The mitral valve is normal in structure. Mild mitral valve  regurgitation. No evidence of mitral stenosis.   5. Tricuspid valve regurgitation is moderate to severe.  6. The aortic valve is normal in structure. There is mild calcification  of the aortic valve. There is mild thickening of the aortic valve. Aortic  valve regurgitation is moderate. No aortic stenosis is  present. Aortic  regurgitation PHT measures 457 msec.  7. The inferior vena cava is normal in size with greater than 50%  respiratory variability, suggesting right atrial pressure of 3 mmHg.   Assessment and Plan:  1.  Recent NSTEMI as outlined above, now status post DES to the ramus intermedius and DES x2 to the mid RCA.  LAD stent sites were patent.  LVEF 55 to 60% with wall motion abnormalities consistent with ischemic heart disease.  She will stop aspirin after total of 1 week, otherwise continue Plavix along with her Eliquis.  Also continue Cardizem CD, losartan, Lopressor, and Crestor.  2.  Essential hypertension, systolic 0000000 today, down from presentation with recent ACS.  Encouraged her to check blood pressures with automatic cuff at home, will arrange follow-up in the [place] office for further potential medication titration.  3.  Mixed hyperlipidemia, on Crestor.  Recent LDL 61.  4.  Paroxysmal atrial fibrillation, CHA2DS2-VASc score is 6.  Continue Eliquis for stroke prophylaxis.  Heart rate is regular today, she was transiently in atrial fibrillation during recent hospital stay, resolved on amiodarone IV.  Continue Cardizem CD and Lopressor.  She was being considered for placement of ILR per Dr. Rayann Heman to get a better sense of atrial fibrillation burden as a means of guiding further treatment options.  Medication Adjustments/Labs and Tests Ordered: Current medicines are reviewed at length with the patient today.  Concerns regarding medicines are outlined above.   Tests Ordered: No orders of the defined types were placed in this encounter.   Medication Changes: Meds ordered this encounter  Medications  . apixaban  (ELIQUIS) 5 MG TABS tablet    Sig: Take 1 tablet (5 mg total) by mouth 2 (two) times daily.    Dispense:  28 tablet    Refill:  0    Lot # [id number] Exp 05/2021    Disposition:  Follow up 3 to 4 weeks in the [place] office.  Signed, Satira Sark, MD, [facility] 03/15/2020 2:10 PM    [facility] [place], [place], [place] [address] Phone: [phone]; Fax: [phone]

## 2020-03-15 NOTE — Patient Instructions (Addendum)
Medication Instructions:   Your physician recommends that you continue on your current medications as directed. Please refer to the Current Medication list given to you today.  Stop aspirin at the end of this week  Labwork:  none  Testing/Procedures:  none  Follow-Up:  Your physician recommends that you schedule a follow-up appointment in: 3-4 weeks with Bernerd Pho PA at the [place] office.   Any Other Special Instructions Will Be Listed Below (If Applicable). Your physician has requested that you regularly monitor and record your blood pressure readings at home. Please use the same machine at the same time of day to check your readings and record them to bring to your follow-up visit.  If you need a refill on your cardiac medications before your next appointment, please call your pharmacy.

## 2020-03-17 ENCOUNTER — Encounter: Payer: Self-pay | Admitting: Orthopaedic Surgery

## 2020-03-17 ENCOUNTER — Ambulatory Visit (INDEPENDENT_AMBULATORY_CARE_PROVIDER_SITE_OTHER): Payer: Medicare Other | Admitting: Orthopaedic Surgery

## 2020-03-17 ENCOUNTER — Other Ambulatory Visit: Payer: Self-pay

## 2020-03-17 DIAGNOSIS — M5442 Lumbago with sciatica, left side: Secondary | ICD-10-CM | POA: Diagnosis not present

## 2020-03-17 DIAGNOSIS — M545 Low back pain, unspecified: Secondary | ICD-10-CM | POA: Insufficient documentation

## 2020-03-17 DIAGNOSIS — M5441 Lumbago with sciatica, right side: Secondary | ICD-10-CM

## 2020-03-17 DIAGNOSIS — G8929 Other chronic pain: Secondary | ICD-10-CM

## 2020-03-17 NOTE — Progress Notes (Signed)
Office Visit Note   Patient: [name]           Date of Birth: 11/01/1940           MRN: [id number] Visit Date: 03/17/2020              Requested by: Ivy Lynn, NP [address] [place],  [place] [address] PCP: Ivy Lynn, NP   Assessment & Plan: Visit Diagnoses:  1. Chronic bilateral low back pain with bilateral sciatica    This demonstrated multifactorial severe spinal stenosis at L3-4, L4-5 moderate central canal stenosis at L2-3 and L5-S1. There was some disc bulging and advanced facet arthropathy as well. Long discussion regarding the diagnosis and treatment options. Her daughter relates that she had a mild "heart attack" Monday and is on Eliquis and another blood thinner. She has had a prior history of back pain and underwent a series of injections in [place] years ago. She does not remember the details or the physician. Mrs. Woodmansee accompanied by her daughter and here for follow-up evaluation of her low back pain. She had an MRI scan performed at [facility] on Dec 16. She is experiencing claudication when she stands for a length of time or when she walks any distance. She asked about a chiropractor treatment and I thought that was fine as she has been there in the past. Also suggested she download some back exercises. They are fine with the above. Answer any questions and they will plan to return as needed. Also discussed back surgery but to show he is not a candidate given all of her comorbidities  Follow-Up Instructions: Return if symptoms worsen or fail to improve.   Orders:  No orders of the defined types were placed in this encounter.  No orders of the defined types were placed in this encounter.     Procedures: No procedures performed   Clinical Data: No additional findings.   Subjective: Chief Complaint  Patient presents with  . Lower Back - Follow-up    MRI lumbar review  Patient returns to review MRI lumbar spine. She has had a  heart attack and three stents placed since last office visit. She has complaints of mostly being weak with some shortness of breath.  Has a prior history of low back pain treated in years ago in [place] with injections that she is not sure made much of a difference. She continues to have some pain in her back when she stands or walks any distance  HPI  Review of Systems   Objective: Vital Signs: Ht 5\' 1"  (1.549 m)   Wt 183 lb (83 kg)   LMP  (LMP Unknown)   BMI 34.58 kg/m   Physical Exam Constitutional:      Appearance: She is well-developed and well-nourished.  HENT:     Mouth/Throat:     Mouth: Oropharynx is clear and moist.  Eyes:     Extraocular Movements: EOM normal.     Pupils: Pupils are equal, round, and reactive to light.  Pulmonary:     Effort: Pulmonary effort is normal.  Skin:    General: Skin is warm and dry.  Neurological:     Mental Status: She is alert and oriented to person, place, and time.  Psychiatric:        Mood and Affect: Mood and affect normal.        Behavior: Behavior normal.     Ortho Exam valuated sitting. No shortness of  breath or chest pain. Straight leg raise negative. Motor exam intact. Painless range of motion both hips specialty Comments:  No specialty comments available.  Imaging: No results found.   PMFS History: Patient Active Problem List   Diagnosis Date Noted  . Low back pain 03/17/2020  . Non-ST elevation (NSTEMI) myocardial infarction (HCC)   . Unstable angina (HCC) 03/08/2020  . Hypothyroidism 02/06/2020  . Paroxysmal atrial fibrillation (HCC) 12/16/2019  . Secondary hypercoagulable state (HCC) 12/16/2019  . 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 Annual physical exam 10/02/2019  . Anxiety with depression 10/02/2019  . Numbness 04/02/2018  . Numbness of tongue 04/02/2018  . Neck pain on right side 04/02/2018  . Type 2 diabetes mellitus without complication (HCC) 12/21/2017  . Atrial fibrillation with rapid  ventricular response (HCC)   . Chest pain in adult   . Atrial fibrillation with RVR (HCC) 12/17/2017  . Accelerating angina (HCC)   . Exertional angina (HCC) 02/23/2015  . Chest pain 11/01/2012  . Mixed hyperlipidemia 11/12/2009  . Essential hypertension 11/12/2009  . Coronary atherosclerosis of native coronary artery 09/25/2008  . Pain of back and left lower extremity 02/19/2008   Past Medical History:  Diagnosis Date  . Anxiety   . Arthritis   . Bulging lumbar disc   . Chronic lower back pain   . Coronary atherosclerosis of native coronary artery    DES LAD 02/2008, DES LAD 02/2015, DES ramus and DES x2 mid RCA 02/2020  . Depression   . Dyslipidemia   . Essential hypertension   . Facial numbness   . Frequent headaches   . GERD (gastroesophageal reflux disease)   . Glaucoma   . Insomnia   . Paroxysmal atrial fibrillation [facility])    Diagnosed October 2019  . PSVT (paroxysmal supraventricular tachycardia) (HCC)   . Refusal of blood transfusions as patient is Jehovah's Witness   . Thyroid disease   . Type 2 diabetes mellitus (HCC)    Borderline  . Vitamin D deficiency     Family History  Problem Relation Age of Onset  . Other Mother        "natural causes"  . Heart disease Mother   . Cancer Father        unsure of type    Past Surgical History:  Procedure Laterality Date  . CARDIAC CATHETERIZATION N/A 02/23/2015   Procedure: Left Heart Cath and Coronary Angiography;  Surgeon: Tonny Bollman, MD;  Location: [facility] IN[facility]ASIVE [facility];  Service: Cardiovascular;  Laterality: N/A;  . CARDIAC CATHETERIZATION N/A 02/23/2015   Procedure: Coronary Stent Intervention;  Surgeon: Tonny Bollman, MD;  Location: [facility];  Service: Cardiovascular;  Laterality: N/A;  . CARDIAC CATHETERIZATION N/A 01/04/2016   Procedure: Left Heart Cath and Coronary Angiography;  Surgeon: Dania Marsan M [place]az[place], MD;  Location: [facility] INVASIVE [facility] LAB;  Service: Cardiovascular;  Laterality: N/A;  . CATARACT EXTRACTION     . CORONARY ANGIOPLASTY  02/23/2015  . CORONARY ANGIOPLASTY WITH STENT PLACEMENT  2009  . CORONARY STENT INTERVENTION N/A 03/09/2020   Procedure: CORONARY STENT INTERVENTION;  Surgeon: Corky Crafts, MD;  Location: [facility] INVASIVE CV LAB;  Service: Cardiovascular;  Laterality: N/A;  . DILATION AND CURETTAGE OF UTERUS    . LEFT HEART CATH AND CORONARY ANGIOGRAPHY N/A 03/09/2020   Procedure: LEFT HEART CATH AND CORONARY ANGIOGRAPHY;  Surgeon: Corky Crafts, MD;  Location: [facility] INVASIVE CV LAB;  Service: Cardiovascular;  Laterality: N/A;  . TUBAL LIGATION     Social  History   Occupational History  . Occupation: RETIRED  Tobacco Use  . Smoking status: Never Smoker  . Smokeless tobacco: Never Used  Vaping Use  . Vaping Use: Never used  Substance and Sexual Activity  . Alcohol use: Yes    Alcohol/week: 1.0 standard drink    Types: 1 Glasses of wine per week    Comment: RARE OCCASION  . Drug use: No  . Sexual activity: Not Currently

## 2020-03-23 ENCOUNTER — Telehealth: Payer: Self-pay | Admitting: Cardiology

## 2020-03-23 ENCOUNTER — Telehealth: Payer: Self-pay | Admitting: Internal Medicine

## 2020-03-23 NOTE — Telephone Encounter (Signed)
Patient's daughter states the patient had a heart attack 2 weeks ago. She would like to know if the patient can still have her implant appointment 04/01/2020. She states to call [phone]. If she cannot be reached there please call: [phone]

## 2020-03-23 NOTE — Telephone Encounter (Signed)
Pt wants to make sure it's ok for her to go to the Chiropractor since she's had her heart attack.   [phone]

## 2020-03-23 NOTE — Telephone Encounter (Signed)
Advised the patient that there should be no issue with getting the loop implanted regarding her recent MI. Patient verbalized understanding

## 2020-03-23 NOTE — Telephone Encounter (Signed)
Pt made aware that is fine to keep appt with chiropractor and to also make him aware of recent NSTEMI

## 2020-03-31 DIAGNOSIS — H401131 Primary open-angle glaucoma, bilateral, mild stage: Secondary | ICD-10-CM | POA: Diagnosis not present

## 2020-04-01 ENCOUNTER — Encounter: Payer: Self-pay | Admitting: Internal Medicine

## 2020-04-01 ENCOUNTER — Other Ambulatory Visit: Payer: Self-pay

## 2020-04-01 ENCOUNTER — Ambulatory Visit (INDEPENDENT_AMBULATORY_CARE_PROVIDER_SITE_OTHER): Payer: Medicare Other | Admitting: Internal Medicine

## 2020-04-01 VITALS — BP 162/64 | HR 67 | Ht 62.0 in | Wt 179.0 lb

## 2020-04-01 DIAGNOSIS — D6869 Other thrombophilia: Secondary | ICD-10-CM | POA: Diagnosis not present

## 2020-04-01 DIAGNOSIS — I25119 Atherosclerotic heart disease of native coronary artery with unspecified angina pectoris: Secondary | ICD-10-CM

## 2020-04-01 DIAGNOSIS — I480 Paroxysmal atrial fibrillation: Secondary | ICD-10-CM

## 2020-04-01 HISTORY — PX: OTHER SURGICAL HISTORY: SHX169

## 2020-04-01 NOTE — Patient Instructions (Addendum)
Medication Instructions:  Your physician recommends that you continue on your current medications as directed. Please refer to the Current Medication list given to you today.  Labwork: None ordered.  Testing/Procedures: None ordered.   Your physician wants you to follow-up in: 07/30/20 at 2:30 pm with Dr. Rayann Heman in [place].     Implantable Loop Recorder Placement, Care After This sheet gives you information about how to care for yourself after your procedure. Your health care provider may also give you more specific instructions. If you have problems or questions, contact your health care provider. What can I expect after the procedure? After the procedure, it is common to have:  Soreness or discomfort near the incision.  Some swelling or bruising near the incision.  Follow these instructions at home: Incision care  1.  Leave your outer dressing on for 24 hours.  After 24 hours you can remove your outer dressing and shower. 2. Leave adhesive strips in place. These skin closures may need to stay in place for 1-2 weeks. If adhesive strip edges start to loosen and curl up, you may trim the loose edges.  You may remove the strips if they have not fallen off after 2 weeks. 3. Check your incision area every day for signs of infection. Check for: a. Redness, swelling, or pain. b. Fluid or blood. c. Warmth. d. Pus or a bad smell. 4. Do not take baths, swim, or use a hot tub until your incision is completely healed. 5. If your wound site starts to bleed apply pressure.      If you have any questions/concerns please call the device clinic at [phone].  Activity  Return to your normal activities.  General instructions  Follow instructions from your health care provider about how to manage your implantable loop recorder and transmit the information. Learn how to activate a recording if this is necessary for your type of device.  Do not go through a metal detection gate, and do not  let someone hold a metal detector over your chest. Show your ID card.  Do not have an MRI unless you check with your health care provider first.  Take over-the-counter and prescription medicines only as told by your health care provider.  Keep all follow-up visits as told by your health care provider. This is important. Contact a health care provider if:  You have redness, swelling, or pain around your incision.  You have a fever.  You have pain that is not relieved by your pain medicine.  You have triggered your device because of fainting (syncope) or because of a heartbeat that feels like it is racing, slow, fluttering, or skipping (palpitations). Get help right away if you have:  Chest pain.  Difficulty breathing. Summary  After the procedure, it is common to have soreness or discomfort near the incision.  Change your dressing as told by your health care provider.  Follow instructions from your health care provider about how to manage your implantable loop recorder and transmit the information.  Keep all follow-up visits as told by your health care provider. This is important. This information is not intended to replace advice given to you by your health care provider. Make sure you discuss any questions you have with your health care provider. Document Released: 01/18/2015 Document Revised: 03/24/2017 Document Reviewed: 03/24/2017 Elsevier Patient Education  2020 [employer].

## 2020-04-01 NOTE — Progress Notes (Signed)
PCP: Ivy Lynn, NP Primary Cardiologist: Dr Kyra Searles Primary EP: Dr Melina Schools [name] is a 80 y.o. female who presents today for routine electrophysiology followup.  Since last being seen in our clinic, the patient reports doing very well.  She underwent PCI in January and has had substantial improvement in SOB.  She still has palpitations.  She is concerned about AF as the cause.  Today, she denies symptoms of chest pain, shortness of breath,  lower extremity edema, dizziness, presyncope, or syncope.  The patient is otherwise without complaint today.   Past Medical History:  Diagnosis Date  . Anxiety   . Arthritis   . Bulging lumbar disc   . Chronic lower back pain   . Coronary atherosclerosis of native coronary artery    DES LAD 02/2008, DES LAD 02/2015, DES ramus and DES x2 mid RCA 02/2020  . Depression   . Dyslipidemia   . Essential hypertension   . Facial numbness   . Frequent headaches   . GERD (gastroesophageal reflux disease)   . Glaucoma   . Insomnia   . Paroxysmal atrial fibrillation [facility])    Diagnosed October 2019  . PSVT (paroxysmal supraventricular tachycardia) ([place])   . Refusal of blood transfusions as patient is Jehovah's Witness   . Thyroid disease   . Type 2 diabetes mellitus (HCC)    Borderline  . Vitamin D deficiency    Past Surgical History:  Procedure Laterality Date  . CARDIAC CATHETERIZATION N/A 02/23/2015   Procedure: Left Heart Cath and Coronary Angiography;  Surgeon: Sherren Mocha, MD;  Location: [place] [facility];  Service: Cardiovascular;  Laterality: N/A;  . CARDIAC CATHETERIZATION N/A 02/23/2015   Procedure: Coronary Stent Intervention;  Surgeon: Sherren Mocha, MD;  Location: [place] [facility];  Service: Cardiovascular;  Laterality: N/A;  . CARDIAC CATHETERIZATION N/A 01/04/2016   Procedure: Left Heart Cath and Coronary Angiography;  Surgeon: Peter M Martinique, MD;  Location: [place] [facility];  Service: Cardiovascular;  Laterality: N/A;   . CATARACT EXTRACTION    . CORONARY ANGIOPLASTY  02/23/2015  . CORONARY ANGIOPLASTY WITH STENT PLACEMENT  2009  . CORONARY STENT INTERVENTION N/A 03/09/2020   Procedure: CORONARY STENT INTERVENTION;  Surgeon: Jettie Booze, MD;  Location: [place] [facility];  Service: Cardiovascular;  Laterality: N/A;  . DILATION AND CURETTAGE OF UTERUS    . LEFT HEART CATH AND CORONARY ANGIOGRAPHY N/A 03/09/2020   Procedure: LEFT HEART CATH AND CORONARY ANGIOGRAPHY;  Surgeon: Jettie Booze, MD;  Location: [place];  Service: Cardiovascular;  Laterality: N/A;  . TUBAL LIGATION      ROS- all systems are reviewed and negatives except as per HPI above  Current Outpatient Medications  Medication Sig Dispense Refill  . amitriptyline (ELAVIL) 10 MG tablet Take 1 tablet (10 mg total) by mouth at bedtime. 60 tablet 2  . apixaban (ELIQUIS) 5 MG TABS tablet Take 1 tablet (5 mg total) by mouth 2 (two) times daily. 28 tablet 0  . Ascorbic Acid (VITAMIN C) 500 MG tablet Take 500 mg by mouth daily.      . bimatoprost (LUMIGAN) 0.01 % SOLN Place 1 drop into both eyes at bedtime.    . Calcium Carbonate Antacid (TUMS PO) Take 2 tablets by mouth 4 (four) times daily as needed (for acid reflux/indigestion).     . Cholecalciferol (VITAMIN D) 400 UNITS capsule Take 400 Units by mouth daily.      . clopidogrel (  PLAVIX) 75 MG tablet Take 1 tablet (75 mg total) by mouth daily with breakfast. 90 tablet 2  . Cyanocobalamin (VITAMIN B 12 PO) Take 1 tablet by mouth daily.    . diclofenac Sodium (VOLTAREN) 1 % GEL Apply 2 g topically 4 (four) times daily. (Patient taking differently: Apply 2 g topically 4 (four) times daily. As needed) 50 g 2  . diltiazem (CARDIZEM CD) 240 MG 24 hr capsule Take 1 capsule (240 mg total) by mouth daily. 90 capsule 0  . Garlic 053 MG TABS Take 1 tablet by mouth daily.    Marland Kitchen HYDROcodone-acetaminophen (NORCO) 10-325 MG tablet Take 1 tablet by mouth 4 (four) times daily as needed. For  pain.    Marland Kitchen levothyroxine (SYNTHROID) 75 MCG tablet Take 1 tablet (75 mcg total) by mouth daily before breakfast. 90 tablet 0  . losartan (COZAAR) 50 MG tablet Take 1 tablet (50 mg total) by mouth daily. 90 tablet 0  . MAGNESIUM CARBONATE PO Take 1 tablet by mouth daily. 400 mg    . Menthol, Topical Analgesic, 4 % GEL Apply 1 application topically 4 (four) times daily as needed (for pain).     . metFORMIN (GLUCOPHAGE-XR) 500 MG 24 hr tablet Take 1 tablet by mouth daily.     . metoprolol tartrate (LOPRESSOR) 100 MG tablet Take 1 tablet (100 mg total) by mouth 2 (two) times daily. 180 tablet 3  . nitroGLYCERIN (NITROSTAT) 0.4 MG SL tablet Place 0.4 mg under the tongue every 5 (five) minutes as needed for chest pain.    . pantoprazole (PROTONIX) 40 MG tablet TAKE ONE (1) TABLET EACH DAY 90 tablet 2  . promethazine (PHENERGAN) 25 MG tablet 1 tablet as needed.    . rosuvastatin (CRESTOR) 20 MG tablet Take 1 tablet (20 mg total) by mouth daily. 30 tablet 0  . timolol (TIMOPTIC) 0.5 % ophthalmic solution Place 1 drop into both eyes daily.    . traZODone (DESYREL) 100 MG tablet Take 1 tablet by mouth at bedtime.     . TURMERIC PO Take 1 tablet by mouth daily.    . VENTOLIN HFA 108 (90 Base) MCG/ACT inhaler Inhale 1-2 puffs into the lungs every 4 (four) hours as needed for shortness of breath.     No current facility-administered medications for this visit.    Physical Exam: Vitals:   04/01/20 1129  BP: (!) 162/64  Pulse: 67  SpO2: 97%  Weight: 179 lb (81.2 kg)  Height: 5\' 2"  (1.575 m)    GEN- The patient is well appearing, alert and oriented x 3 today.   Head- normocephalic, atraumatic Eyes-  Sclera clear, conjunctiva pink Ears- hearing intact Oropharynx- clear Lungs- Clear to ausculation bilaterally, normal work of breathing Heart- Regular rate and rhythm, no murmurs, rubs or gallops, PMI not laterally displaced GI- soft, NT, ND, + BS Extremities- no clubbing, cyanosis, or edema  Wt  Readings from Last 3 Encounters:  04/01/20 179 lb (81.2 kg)  03/17/20 183 lb (83 kg)  03/15/20 183 lb (83 kg)    EKG tracing ordered today is personally reviewed and shows sinus  Assessment and Plan:  1. Paroxysmal atrial fibrillation She has palpitations of unclear etiology.  There is concern for afib as the cause of palpitations and SOB.  She is not very active.  Recent event monitor did not show afib.  I would therefore advise implantation of an implantable loop recorder for long term arrhythmia monitoring.  Risks and benefits to ILR were  discussed at length with the patient today, including but not limited to risks of bleeding and infection.  Extensive device education was performed.  Remote monitoring was also discussed at length today.  The patient understands and wishes to proceed.  We will proceed at this time with ILR implantation.  2. SOB multifactoral I am not convinced that AF is the cauase  3. HTN Stable No change required today  4. CAD S/p recent DES for NSTEMI Follows with Dr Haywood Lasso MD, [facility] 04/01/2020 11:44 AM      DESCRIPTION OF PROCEDURE:  Informed written consent was obtained.  The patient required no sedation for the procedure today.  The patients left chest was prepped and draped. Mapping over the patient's chest was performed to identify the appropriate ILR site.  This area was found to be the left  parasternal region over the 3rd-4th intercostal space.  The skin overlying this region was infiltrated with lidocaine for local analgesia.  A 0.5-cm incision was made at the implant site.  A subcutaneous ILR pocket was fashioned using a combination of sharp and blunt dissection.  A Medtronic Reveal Linq model [id number] [id number]) implantable loop recorder was then placed into the pocket R waves were very prominent and measured > 0.2 mV. EBL<1 ml.  Steri- Strips and a sterile dressing were then applied.  There were no early apparent complications.      CONCLUSIONS:   1. Successful implantation of a Medtronic Reveal LINQ implantable loop recorder for evaluation of palpitations and AF management post ablation  2. No early apparent complications.   Thompson Grayer MD, [facility] 04/01/2020 11:44 AM

## 2020-04-13 NOTE — Progress Notes (Unsigned)
Cardiology Office Note    Date:  04/14/2020   ID:  [name], DOB 1940-11-22, MRN [id number]  PCP:  Ivy Lynn, NP  Cardiologist: Rozann Lesches, MD   EP: Dr. Rayann Heman  Chief Complaint  Patient presents with  . Follow-up    3 week visit    History of Present Illness:    [name] is a 80 y.o. female with past medical history of CAD (s/p DES to LAD in 2010 and repeat DES to LAD in 02/2015, DES to RI and DESx2 to RCA in 02/2020), paroxysmal atrial fibrillation (previously on Amiodarone but discontinued due to hypothyroidism), HTN, HLD, Type 2 DM, Hypothyroidism and Jehovah's Witness who presents to the office today for 3-week follow-up.  She was admitted to [facility] in 02/2020 for symptoms concerning for unstable angina and was found to have an NSTEMI. Underwent a repeat cardiac catheterization with DESx2 to RCA and DES to RI. Was recommended to continue ASA for only 1 week due to her bleeding risk and continue Plavix for 1 year if tolerated given the concurrent use of Eliquis. Follow-up echo showed a preserved EF of 55-60% with areas of HK noted as outlined below. Hospitalization was also complicated by episodes of atrial fibrillation with RVR and she converted to NSR with IV Amiodarone.   She did follow-up with Dr. Domenic Polite on 03/15/2020 and she reported still having dyspnea on exertion but denied any chest pain. Was recommended to stop ASA after 1 week and continue her other medications. She did undergo placement of an ILR by Dr. Rayann Heman on 04/01/2020.  In talking with the patient today, she denies any recurrent chest pain and says her dyspnea has improved. She still feels fatigued but is gradually trying to increase her activity level around the house. Says she will be unable to participate in cardiac rehab due to transportation issues.   She denies any recent orthopnea, PND or edema. No recent palpitations. She did experiencing bruising along her breasts bilaterally  following ILR placement but has not noticed any active bleeding or drainage along the site.     Past Medical History:  Diagnosis Date  . Anxiety   . Arthritis   . Bulging lumbar disc   . Chronic lower back pain   . Coronary atherosclerosis of native coronary artery    DES LAD 02/2008, DES LAD 02/2015, DES ramus and DES x2 mid RCA 02/2020  . Depression   . Dyslipidemia   . Essential hypertension   . Facial numbness   . Frequent headaches   . GERD (gastroesophageal reflux disease)   . Glaucoma   . Heart attack ([place])   . Insomnia   . Paroxysmal atrial fibrillation [facility])    Diagnosed October 2019  . PSVT (paroxysmal supraventricular tachycardia) ([place])   . Refusal of blood transfusions as patient is Jehovah's Witness   . Thyroid disease   . Type 2 diabetes mellitus (HCC)    Borderline  . Vitamin D deficiency     Past Surgical History:  Procedure Laterality Date  . CARDIAC CATHETERIZATION N/A 02/23/2015   Procedure: Left Heart Cath and Coronary Angiography;  Surgeon: Sherren Mocha, MD;  Location: [place] [facility];  Service: Cardiovascular;  Laterality: N/A;  . CARDIAC CATHETERIZATION N/A 02/23/2015   Procedure: Coronary Stent Intervention;  Surgeon: Sherren Mocha, MD;  Location: [place] [facility];  Service: Cardiovascular;  Laterality: N/A;  . CARDIAC CATHETERIZATION N/A 01/04/2016   Procedure: Left Heart Cath and Coronary  Angiography;  Surgeon: Peter M Martinique, MD;  Location: [place] [facility];  Service: Cardiovascular;  Laterality: N/A;  . CATARACT EXTRACTION    . CORONARY ANGIOPLASTY  02/23/2015  . CORONARY ANGIOPLASTY WITH STENT PLACEMENT  2009  . CORONARY STENT INTERVENTION N/A 03/09/2020   Procedure: CORONARY STENT INTERVENTION;  Surgeon: Jettie Booze, MD;  Location: [place] [facility];  Service: Cardiovascular;  Laterality: N/A;  . DILATION AND CURETTAGE OF UTERUS    . implantable loop recorder placement  04/01/2020   Medtronic Reveal Linq model LNQ [id number])  implantable loop recorder   . LEFT HEART CATH AND CORONARY ANGIOGRAPHY N/A 03/09/2020   Procedure: LEFT HEART CATH AND CORONARY ANGIOGRAPHY;  Surgeon: Jettie Booze, MD;  Location: [place] [facility];  Service: Cardiovascular;  Laterality: N/A;  . TUBAL LIGATION      Current Medications: Outpatient Medications Prior to Visit  Medication Sig Dispense Refill  . amitriptyline (ELAVIL) 10 MG tablet Take 1 tablet (10 mg total) by mouth at bedtime. 60 tablet 2  . apixaban (ELIQUIS) 5 MG TABS tablet Take 1 tablet (5 mg total) by mouth 2 (two) times daily. 28 tablet 0  . Ascorbic Acid (VITAMIN C) 500 MG tablet Take 500 mg by mouth daily.      . bimatoprost (LUMIGAN) 0.01 % SOLN Place 1 drop into both eyes at bedtime.    . Calcium Carbonate Antacid (TUMS PO) Take 2 tablets by mouth 4 (four) times daily as needed (for acid reflux/indigestion).     . Cholecalciferol (VITAMIN D) 400 UNITS capsule Take 400 Units by mouth daily.      . Cyanocobalamin (VITAMIN B 12 PO) Take 1 tablet by mouth daily.    . diclofenac Sodium (VOLTAREN) 1 % GEL Apply 2 g topically 4 (four) times daily. (Patient taking differently: Apply 2 g topically 4 (four) times daily. As needed) 50 g 2  . Garlic 295 MG TABS Take 1 tablet by mouth daily.    Marland Kitchen HYDROcodone-acetaminophen (NORCO) 10-325 MG tablet Take 1 tablet by mouth 4 (four) times daily as needed. For pain.    Marland Kitchen levothyroxine (SYNTHROID) 75 MCG tablet Take 1 tablet (75 mcg total) by mouth daily before breakfast. 90 tablet 0  . MAGNESIUM CARBONATE PO Take 1 tablet by mouth daily. 400 mg    . metFORMIN (GLUCOPHAGE-XR) 500 MG 24 hr tablet Take 1 tablet by mouth daily.     . metoprolol tartrate (LOPRESSOR) 100 MG tablet Take 1 tablet (100 mg total) by mouth 2 (two) times daily. 180 tablet 3  . nitroGLYCERIN (NITROSTAT) 0.4 MG SL tablet Place 0.4 mg under the tongue every 5 (five) minutes as needed for chest pain.    . promethazine (PHENERGAN) 25 MG tablet 1 tablet as needed.     . timolol (TIMOPTIC) 0.5 % ophthalmic solution Place 1 drop into both eyes daily.    . traZODone (DESYREL) 100 MG tablet Take 1 tablet by mouth at bedtime.     . TURMERIC PO Take 1 tablet by mouth daily.    . VENTOLIN HFA 108 (90 Base) MCG/ACT inhaler Inhale 1-2 puffs into the lungs every 4 (four) hours as needed for shortness of breath.    . clopidogrel (PLAVIX) 75 MG tablet Take 1 tablet (75 mg total) by mouth daily with breakfast. 90 tablet 2  . diltiazem (CARDIZEM CD) 240 MG 24 hr capsule Take 1 capsule (240 mg total) by mouth daily. 90 capsule 0  . losartan (COZAAR)  50 MG tablet Take 1 tablet (50 mg total) by mouth daily. 90 tablet 0  . pantoprazole (PROTONIX) 40 MG tablet TAKE ONE (1) TABLET EACH DAY 90 tablet 2  . rosuvastatin (CRESTOR) 20 MG tablet Take 1 tablet (20 mg total) by mouth daily. 30 tablet 0  . Menthol, Topical Analgesic, 4 % GEL Apply 1 application topically 4 (four) times daily as needed (for pain).      No facility-administered medications prior to visit.     Allergies:   Patient has no known allergies.   Social History   Socioeconomic History  . Marital status: Widowed    Spouse name: Not on file  . Number of children: 8  . Years of education: 10th grade  . Highest education level: 10th grade  Occupational History  . Occupation: RETIRED  Tobacco Use  . Smoking status: Never Smoker  . Smokeless tobacco: Never Used  Vaping Use  . Vaping Use: Never used  Substance and Sexual Activity  . Alcohol use: Yes    Alcohol/week: 1.0 standard drink    Types: 1 Glasses of wine per week    Comment: RARE OCCASION  . Drug use: No  . Sexual activity: Not Currently  Other Topics Concern  . Not on file  Social History Narrative   Lives at home alone.   Right-handed.   No caffeine use.   Social Determinants of Health   Financial Resource Strain: Low Risk   . Difficulty of Paying Living Expenses: Not hard at all  Food Insecurity: No Food Insecurity  . Worried  About Charity fundraiser in the Last Year: Never true  . Ran Out of Food in the Last Year: Never true  Transportation Needs: No Transportation Needs  . Lack of Transportation (Medical): No  . Lack of Transportation (Non-Medical): No  Physical Activity: Inactive  . Days of Exercise per Week: 0 days  . Minutes of Exercise per Session: 0 min  Stress: No Stress Concern Present  . Feeling of Stress : Not at all  Social Connections: Moderately Integrated  . Frequency of Communication with Friends and Family: More than three times a week  . Frequency of Social Gatherings with Friends and Family: More than three times a week  . Attends Religious Services: More than 4 times per year  . Active Member of Clubs or Organizations: Yes  . Attends Archivist Meetings: More than 4 times per year  . Marital Status: Widowed     Family History:  The patient's family history includes Cancer in her father; Heart disease in her mother; Other in her mother.   Review of Systems:   Please see the history of present illness.     General:  No chills, fever, night sweats or weight changes. Positive for fatigue.  Cardiovascular:  [name], dyspnea on exertion, edema, orthopnea, palpitations, paroxysmal nocturnal dyspnea. Dermatological: No rash, lesions/masses Respiratory: No cough, dyspnea Urologic: No hematuria, dysuria Abdominal:   No nausea, vomiting, diarrhea, bright red blood per rectum, melena, or hematemesis Neurologic:  No visual changes, wkns, changes in mental status. All other systems reviewed and are otherwise negative except as noted above.   Physical Exam:    VS:  BP 126/74   Pulse 63   Ht 5\' 4"  (1.626 m)   Wt 178 lb (80.7 kg)   LMP  (LMP Unknown)   SpO2 98%   BMI 30.55 kg/m    General: Well developed, well nourished,female appearing in no  acute distress. Head: Normocephalic, atraumatic. Neck: No carotid bruits. JVD not elevated.  Lungs: Respirations regular and  unlabored, without wheezes or rales.  Heart: Regular rate and rhythm. No S3 or S4.  No murmur, no rubs, or gallops appreciated. Abdomen: Appears non-distended. No obvious abdominal masses. Msk:  Strength and tone appear normal for age. No obvious joint deformities or effusions. Extremities: No clubbing or cyanosis. No edema.  Distal pedal pulses are 2+ bilaterally. Neuro: Alert and oriented X 3. Moves all extremities spontaneously. No focal deficits noted. Psych:  Responds to questions appropriately with a normal affect. Skin: No rashes or lesions noted. Ecchymosis along upper portion of left breast but no evidence of a hematoma. No drainage or erythema along ILR site.   Wt Readings from Last 3 Encounters:  04/14/20 178 lb (80.7 kg)  04/01/20 179 lb (81.2 kg)  03/17/20 183 lb (83 kg)     Studies/Labs Reviewed:   EKG:  EKG is not ordered today.    Recent Labs: 03/03/2020: TSH 10.867 03/08/2020: ALT 16 03/11/2020: BUN 9; Creatinine, Ser 0.86; Hemoglobin 11.1; Magnesium 1.9; Platelets 164; Potassium 3.7; Sodium 138   Lipid Panel    Component Value Date/Time   CHOL 124 03/09/2020 0209   TRIG 58 03/09/2020 0209   HDL 51 03/09/2020 0209   CHOLHDL 2.4 03/09/2020 0209   VLDL 12 03/09/2020 0209   LDLCALC 61 03/09/2020 0209    Additional studies/ records that were reviewed today include:   Cardiac Catheterization: 03/09/2020  Prior LAD stents were patient.  Ramus-1 lesion is 95% stenosed. A drug-eluting stent was successfully placed using a STENT RESOLUTE ONYX 2.5X22, postdilated to 2.75 mm. T  Post intervention, there is a 0% residual stenosis.  Ramus-2 lesion is 50% stenosed in tortuous segment distal to the stent.  Post intervention, there is a 0% residual stenosis.  Mid RCA-1 lesion is 75% stenosed. A drug-eluting stent was successfully placed using a STENT RESOLUTE ONYX 2.5X26, postdilated to 2.75 mm.  Post intervention, there is a 0% residual stenosis.  Mid RCA-2 lesion  is 75% stenosed, likely edge dissection in tortuous segment. A drug-eluting stent was successfully placed using a STENT RESOLUTE ONYX 2.5X8, A drug-eluting stent was successfully placed using a STENT RESOLUTE ONYX 2.5X8, postdilated to 2.75 mm.  Ost Cx to Prox Cx lesion is 50% stenosed.  The left ventricular systolic function is normal.  LV end diastolic pressure is mildly elevated.  The left ventricular ejection fraction is 50-55% by visual estimate.  There is no aortic valve stenosis.  A drug-eluting stent was successfully placed using a STENT RESOLUTE ONYX 2.5X22.  A drug-eluting stent was successfully placed using a STENT RESOLUTE ONYX 2.5X26.  A drug-eluting stent was successfully placed using a STENT RESOLUTE ONYX 2.5X8.   Restart Eliquis tomorrow.  Aspirin for only 1 week since I am concerned about her bleeding risk.  Clopidogrel for 1 year if tolerated from a bleeding standpoint.  At minimum, would want at least 6 months of clopidogrel.      Echocardiogram: 03/10/2020 IMPRESSIONS    1. Anterior and basal to mid anterolateral hypokinesis. Posterolateral  hypokinesis. Left ventricular ejection fraction, by estimation, is 55 to  60%. The left ventricle has normal function. The left ventricle  demonstrates regional wall motion  abnormalities (see scoring diagram/findings for description). There is  mild concentric left ventricular hypertrophy. Left ventricular diastolic  parameters are consistent with Grade II diastolic dysfunction  (pseudonormalization). Elevated left ventricular  end-diastolic pressure.  2. Right  ventricular systolic function is normal. The right ventricular  size is normal. There is moderately elevated pulmonary artery systolic  pressure.  3. Right atrial size was mildly dilated.  4. The mitral valve is normal in structure. Mild mitral valve  regurgitation. No evidence of mitral stenosis.  5. Tricuspid valve regurgitation is moderate to severe.   6. The aortic valve is normal in structure. There is mild calcification  of the aortic valve. There is mild thickening of the aortic valve. Aortic  valve regurgitation is moderate. No aortic stenosis is present. Aortic  regurgitation PHT measures 457 msec.  7. The inferior vena cava is normal in size with greater than 50%  respiratory variability, suggesting right atrial pressure of 3 mmHg.    Assessment:    1. Coronary artery disease involving native coronary artery of native heart without angina pectoris   2. PAF (paroxysmal atrial fibrillation) ([place])   3. Essential hypertension   4. Hyperlipidemia LDL goal <70      Plan:   In order of problems listed above:  1. CAD - She is s/p DES to LAD in 2010, repeat DES to LAD in 02/2015 and most recently DES to RI and DESx2 to RCA in 02/2020. - She denies any recurrent chest pain and her dyspnea continues to improve. Still with fatigue and we reviewed ways for her to gradually increase her aerobic activity around her home as she will not be able to participate in cardiac rehab.  - Continue current medication regimen with Plavix 75mg  daily, Lopressor 100mg  BID and Crestor 20mg  daily. She is no longer on ASA given the need for anticoagulation.   2. Paroxysmal Atrial Fibrillation  - She did experience recurrent atrial fibrillation during her recent admission and converted back to NSR with IV Amiodarone. No longer on PO Amiodarone as it is felt this led to her thyroid issues.  - She is being followed by EP and recently underwent ILR placement. Continue Cardizem CD 240mg  daily and Lopressor 100mg  BID for rate-control. - She denies any evidence of active bleeding. Continue Eliquis 5mg  BID for anticoagulation.   3. HTN - BP is well-controlled at 126/74 during today's visit. Continue current medication regimen with Cardizem CD 240mg  daily, Lopressor 100mg  BID and Losartan 50mg  daily.   4. HLD - LDL was at 61 in 02/2020. Continue Crestor 20mg   daily.    Medication Adjustments/Labs and Tests Ordered: Current medicines are reviewed at length with the patient today.  Concerns regarding medicines are outlined above.  Medication changes, Labs and Tests ordered today are listed in the Patient Instructions below. Patient Instructions  Medication Instructions:  Your physician recommends that you continue on your current medications as directed. Please refer to the Current Medication list given to you today.  *If you need a refill on your cardiac medications before your next appointment, please call your pharmacy*   Lab Work: None If you have labs (blood work) drawn today and your tests are completely normal, you will receive your results only by: Marland Kitchen MyChart Message (if you have MyChart) OR . A paper copy in the mail If you have any lab test that is abnormal or we need to change your treatment, we will call you to review the results.   Testing/Procedures: None   Follow-Up: At [facility], you and your health needs are our priority.  As part of our continuing mission to provide you with exceptional heart care, we have created designated Provider Care Teams.  These Care Teams  include your primary Cardiologist (physician) and Advanced Practice Providers (APPs -  Physician Assistants and Nurse Practitioners) who all work together to provide you with the care you need, when you need it.  We recommend signing up for the patient portal called "MyChart".  Sign up information is provided on this After Visit Summary.  MyChart is used to connect with patients for Virtual Visits (Telemedicine).  Patients are able to view lab/test results, encounter notes, upcoming appointments, etc.  Non-urgent messages can be sent to your provider as well.   To learn more about what you can do with MyChart, go to [email or web address].    Your next appointment:   3 month(s)  The format for your next appointment:   In Person  Provider:   You may see  Rozann Lesches, MD or one of the following Advanced Practice Providers on your designated Care Team:    Bernerd Pho, PA-C     Other Instructions  Thank you for choosing [facility]!        Signed, Erma Heritage, PA-C  04/14/2020 6:45 PM    [place] [address] [place], [place] [address] Phone: [phone] Fax: [phone]

## 2020-04-14 ENCOUNTER — Other Ambulatory Visit: Payer: Self-pay

## 2020-04-14 ENCOUNTER — Encounter: Payer: Self-pay | Admitting: Student

## 2020-04-14 ENCOUNTER — Ambulatory Visit (INDEPENDENT_AMBULATORY_CARE_PROVIDER_SITE_OTHER): Payer: Medicare Other | Admitting: Student

## 2020-04-14 VITALS — BP 126/74 | HR 63 | Ht 64.0 in | Wt 178.0 lb

## 2020-04-14 DIAGNOSIS — E785 Hyperlipidemia, unspecified: Secondary | ICD-10-CM

## 2020-04-14 DIAGNOSIS — I480 Paroxysmal atrial fibrillation: Secondary | ICD-10-CM | POA: Diagnosis not present

## 2020-04-14 DIAGNOSIS — I10 Essential (primary) hypertension: Secondary | ICD-10-CM | POA: Diagnosis not present

## 2020-04-14 DIAGNOSIS — I2510 Atherosclerotic heart disease of native coronary artery without angina pectoris: Secondary | ICD-10-CM

## 2020-04-14 MED ORDER — LOSARTAN POTASSIUM 50 MG PO TABS: 50.0000 mg | ORAL_TABLET | Freq: Every day | ORAL | 3 refills | Status: DC

## 2020-04-14 MED ORDER — DILTIAZEM HCL ER COATED BEADS 240 MG PO CP24: 240.0000 mg | ORAL_CAPSULE | Freq: Every day | ORAL | 3 refills | Status: DC

## 2020-04-14 MED ORDER — ROSUVASTATIN CALCIUM 20 MG PO TABS: 20.0000 mg | ORAL_TABLET | Freq: Every day | ORAL | 3 refills | Status: DC

## 2020-04-14 MED ORDER — CLOPIDOGREL BISULFATE 75 MG PO TABS: 75.0000 mg | ORAL_TABLET | Freq: Every day | ORAL | 3 refills | Status: DC

## 2020-04-14 NOTE — Patient Instructions (Signed)
Medication Instructions:  Your physician recommends that you continue on your current medications as directed. Please refer to the Current Medication list given to you today.  *If you need a refill on your cardiac medications before your next appointment, please call your pharmacy*   Lab Work: None If you have labs (blood work) drawn today and your tests are completely normal, you will receive your results only by: Marland Kitchen MyChart Message (if you have MyChart) OR . A paper copy in the mail If you have any lab test that is abnormal or we need to change your treatment, we will call you to review the results.   Testing/Procedures: None   Follow-Up: At [facility], you and your health needs are our priority.  As part of our continuing mission to provide you with exceptional heart care, we have created designated Provider Care Teams.  These Care Teams include your primary Cardiologist (physician) and Advanced Practice Providers (APPs -  Physician Assistants and Nurse Practitioners) who all work together to provide you with the care you need, when you need it.  We recommend signing up for the patient portal called "MyChart".  Sign up information is provided on this After Visit Summary.  MyChart is used to connect with patients for Virtual Visits (Telemedicine).  Patients are able to view lab/test results, encounter notes, upcoming appointments, etc.  Non-urgent messages can be sent to your provider as well.   To learn more about what you can do with MyChart, go to [email or web address].    Your next appointment:   3 month(s)  The format for your next appointment:   In Person  Provider:   You may see Rozann Lesches, MD or one of the following Advanced Practice Providers on your designated Care Team:    Bernerd Pho, PA-C     Other Instructions  Thank you for choosing [facility]!

## 2020-04-29 ENCOUNTER — Other Ambulatory Visit: Payer: Self-pay

## 2020-04-29 ENCOUNTER — Ambulatory Visit (HOSPITAL_COMMUNITY)
Admission: RE | Admit: 2020-04-29 | Discharge: 2020-04-29 | Disposition: A | Discharge status: Home / Self Care | Payer: Medicare Other | Source: Ambulatory Visit | Attending: Nurse Practitioner | Admitting: Nurse Practitioner

## 2020-04-29 DIAGNOSIS — E0590 Thyrotoxicosis, unspecified without thyrotoxic crisis or storm: Secondary | ICD-10-CM | POA: Diagnosis not present

## 2020-04-29 DIAGNOSIS — E032 Hypothyroidism due to medicaments and other exogenous substances: Secondary | ICD-10-CM | POA: Diagnosis not present

## 2020-04-29 DIAGNOSIS — E042 Nontoxic multinodular goiter: Secondary | ICD-10-CM | POA: Diagnosis not present

## 2020-05-03 ENCOUNTER — Other Ambulatory Visit: Payer: Self-pay

## 2020-05-03 DIAGNOSIS — E032 Hypothyroidism due to medicaments and other exogenous substances: Secondary | ICD-10-CM

## 2020-05-03 MED ORDER — LEVOTHYROXINE SODIUM 75 MCG PO TABS: 75.0000 ug | ORAL_TABLET | Freq: Every day | ORAL | 0 refills | Status: DC

## 2020-05-04 ENCOUNTER — Ambulatory Visit: Payer: Medicare Other | Admitting: Nurse Practitioner

## 2020-05-05 ENCOUNTER — Other Ambulatory Visit: Payer: Self-pay

## 2020-05-05 ENCOUNTER — Other Ambulatory Visit (HOSPITAL_COMMUNITY)
Admission: RE | Admit: 2020-05-05 | Discharge: 2020-05-05 | Disposition: A | Discharge status: Home / Self Care | Payer: Medicare Other | Source: Ambulatory Visit | Attending: Nurse Practitioner | Admitting: Nurse Practitioner

## 2020-05-05 DIAGNOSIS — E032 Hypothyroidism due to medicaments and other exogenous substances: Secondary | ICD-10-CM | POA: Diagnosis not present

## 2020-05-05 LAB — T4, FREE: Free T4: 0.95 ng/dL (ref 0.61–1.12)

## 2020-05-05 LAB — TSH: TSH: 6.335 u[IU]/mL — ABNORMAL HIGH (ref 0.350–4.500)

## 2020-05-06 ENCOUNTER — Ambulatory Visit (INDEPENDENT_AMBULATORY_CARE_PROVIDER_SITE_OTHER): Payer: Medicare Other | Admitting: Nurse Practitioner

## 2020-05-06 ENCOUNTER — Encounter (HOSPITAL_COMMUNITY): Payer: Self-pay | Admitting: Radiology

## 2020-05-06 ENCOUNTER — Ambulatory Visit (INDEPENDENT_AMBULATORY_CARE_PROVIDER_SITE_OTHER): Payer: Medicare Other

## 2020-05-06 ENCOUNTER — Encounter: Payer: Self-pay | Admitting: Nurse Practitioner

## 2020-05-06 ENCOUNTER — Other Ambulatory Visit: Payer: Self-pay

## 2020-05-06 DIAGNOSIS — I480 Paroxysmal atrial fibrillation: Secondary | ICD-10-CM

## 2020-05-06 DIAGNOSIS — E032 Hypothyroidism due to medicaments and other exogenous substances: Secondary | ICD-10-CM | POA: Diagnosis not present

## 2020-05-06 DIAGNOSIS — E042 Nontoxic multinodular goiter: Secondary | ICD-10-CM | POA: Diagnosis not present

## 2020-05-06 LAB — CUP PACEART REMOTE DEVICE CHECK
Date Time Interrogation Session: 20220316204601
Implantable Pulse Generator Implant Date: 20220210

## 2020-05-06 MED ORDER — LEVOTHYROXINE SODIUM 88 MCG PO TABS: 88.0000 ug | ORAL_TABLET | Freq: Every day | ORAL | 3 refills | Status: DC

## 2020-05-06 NOTE — Progress Notes (Signed)
05/06/2020, 11:29 AM                          Endocrinology Follow Up Visit   Subjective:    Patient ID: [name], female    DOB: December 18, 1940, PCP Ivy Lynn, NP   Past Medical History:  Diagnosis Date  . Anxiety   . Arthritis   . Bulging lumbar disc   . Chronic lower back pain   . Coronary atherosclerosis of native coronary artery    DES LAD 02/2008, DES LAD 02/2015, DES ramus and DES x2 mid RCA 02/2020  . Depression   . Dyslipidemia   . Essential hypertension   . Facial numbness   . Frequent headaches   . GERD (gastroesophageal reflux disease)   . Glaucoma   . Heart attack ([place])   . Insomnia   . Paroxysmal atrial fibrillation [facility])    Diagnosed October 2019  . PSVT (paroxysmal supraventricular tachycardia) ([place])   . Refusal of blood transfusions as patient is Jehovah's Witness   . Thyroid disease   . Type 2 diabetes mellitus (HCC)    Borderline  . Vitamin D deficiency    Past Surgical History:  Procedure Laterality Date  . CARDIAC CATHETERIZATION N/A 02/23/2015   Procedure: Left Heart Cath and Coronary Angiography;  Surgeon: Sherren Mocha, MD;  Location: [place] [facility];  Service: Cardiovascular;  Laterality: N/A;  . CARDIAC CATHETERIZATION N/A 02/23/2015   Procedure: Coronary Stent Intervention;  Surgeon: Sherren Mocha, MD;  Location: [place] [facility];  Service: Cardiovascular;  Laterality: N/A;  . CARDIAC CATHETERIZATION N/A 01/04/2016   Procedure: Left Heart Cath and Coronary Angiography;  Surgeon: Peter M Martinique, MD;  Location: [place] [facility];  Service: Cardiovascular;  Laterality: N/A;  . CATARACT EXTRACTION    . CORONARY ANGIOPLASTY  02/23/2015  . CORONARY ANGIOPLASTY WITH STENT PLACEMENT  2009  . CORONARY STENT INTERVENTION N/A 03/09/2020   Procedure: CORONARY STENT INTERVENTION;  Surgeon: Jettie Booze, MD;  Location: [place] [facility];  Service: Cardiovascular;  Laterality: N/A;  .  DILATION AND CURETTAGE OF UTERUS    . implantable loop recorder placement  04/01/2020   Medtronic Reveal Linq model [id number] [id number]) implantable loop recorder   . LEFT HEART CATH AND CORONARY ANGIOGRAPHY N/A 03/09/2020   Procedure: LEFT HEART CATH AND CORONARY ANGIOGRAPHY;  Surgeon: Jettie Booze, MD;  Location: [place] [facility];  Service: Cardiovascular;  Laterality: N/A;  . TUBAL LIGATION     Social History   Socioeconomic History  . Marital status: Widowed    Spouse name: Not on file  . Number of children: 8  . Years of education: 10th grade  . Highest education level: 10th grade  Occupational History  . Occupation: RETIRED  Tobacco Use  . Smoking status: Never Smoker  . Smokeless tobacco: Never Used  Vaping Use  . Vaping Use: Never used  Substance and Sexual Activity  . Alcohol use: Yes    Alcohol/week: 1.0 standard drink    Types: 1 Glasses of wine per week    Comment: RARE OCCASION  . Drug use: No  . Sexual activity: Not Currently  Other Topics Concern  .  Not on file  Social History Narrative   Lives at home alone.   Right-handed.   No caffeine use.   Social Determinants of Health   Financial Resource Strain: Low Risk   . Difficulty of Paying Living Expenses: Not hard at all  Food Insecurity: No Food Insecurity  . Worried About Charity fundraiser in the Last Year: Never true  . Ran Out of Food in the Last Year: Never true  Transportation Needs: No Transportation Needs  . Lack of Transportation (Medical): No  . Lack of Transportation (Non-Medical): No  Physical Activity: Inactive  . Days of Exercise per Week: 0 days  . Minutes of Exercise per Session: 0 min  Stress: No Stress Concern Present  . Feeling of Stress : Not at all  Social Connections: Moderately Integrated  . Frequency of Communication with Friends and Family: More than three times a week  . Frequency of Social Gatherings with Friends and Family: More than three times a week  . Attends  Religious Services: More than 4 times per year  . Active Member of Clubs or Organizations: Yes  . Attends Archivist Meetings: More than 4 times per year  . Marital Status: Widowed   Family History  Problem Relation Age of Onset  . Other Mother        "natural causes"  . Heart disease Mother   . Cancer Father        unsure of type   Outpatient Encounter Medications as of 05/06/2020  Medication Sig  . amitriptyline (ELAVIL) 10 MG tablet Take 1 tablet (10 mg total) by mouth at bedtime.  Marland Kitchen apixaban (ELIQUIS) 5 MG TABS tablet Take 1 tablet (5 mg total) by mouth 2 (two) times daily.  . Ascorbic Acid (VITAMIN C) 500 MG tablet Take 500 mg by mouth daily.    . bimatoprost (LUMIGAN) 0.01 % SOLN Place 1 drop into both eyes at bedtime.  . Calcium Carbonate Antacid (TUMS PO) Take 2 tablets by mouth 4 (four) times daily as needed (for acid reflux/indigestion).   . Cholecalciferol (VITAMIN D) 400 UNITS capsule Take 400 Units by mouth daily.    . clopidogrel (PLAVIX) 75 MG tablet Take 1 tablet (75 mg total) by mouth daily with breakfast.  . Cyanocobalamin (VITAMIN B 12 PO) Take 1 tablet by mouth daily.  . diclofenac Sodium (VOLTAREN) 1 % GEL Apply 2 g topically 4 (four) times daily. (Patient taking differently: Apply 2 g topically 4 (four) times daily. As needed)  . diltiazem (CARDIZEM CD) 240 MG 24 hr capsule Take 1 capsule (240 mg total) by mouth daily.  . Garlic 440 MG TABS Take 1 tablet by mouth daily.  Marland Kitchen HYDROcodone-acetaminophen (NORCO) 10-325 MG tablet Take 1 tablet by mouth 4 (four) times daily as needed. For pain.  Marland Kitchen losartan (COZAAR) 50 MG tablet Take 1 tablet (50 mg total) by mouth daily.  Marland Kitchen MAGNESIUM CARBONATE PO Take 1 tablet by mouth daily. 400 mg  . metFORMIN (GLUCOPHAGE-XR) 500 MG 24 hr tablet Take 1 tablet by mouth daily.   . metoprolol tartrate (LOPRESSOR) 100 MG tablet Take 1 tablet (100 mg total) by mouth 2 (two) times daily.  . nitroGLYCERIN (NITROSTAT) 0.4 MG SL tablet  Place 0.4 mg under the tongue every 5 (five) minutes as needed for chest pain.  . promethazine (PHENERGAN) 25 MG tablet 1 tablet as needed.  . rosuvastatin (CRESTOR) 20 MG tablet Take 1 tablet (20 mg total) by mouth daily.  Marland Kitchen  timolol (TIMOPTIC) 0.5 % ophthalmic solution Place 1 drop into both eyes daily.  . traZODone (DESYREL) 100 MG tablet Take 1 tablet by mouth at bedtime.   . TURMERIC PO Take 1 tablet by mouth daily.  . VENTOLIN HFA 108 (90 Base) MCG/ACT inhaler Inhale 1-2 puffs into the lungs every 4 (four) hours as needed for shortness of breath.  . [DISCONTINUED] levothyroxine (SYNTHROID) 75 MCG tablet Take 1 tablet (75 mcg total) by mouth daily before breakfast.  . levothyroxine (SYNTHROID) 88 MCG tablet Take 1 tablet (88 mcg total) by mouth daily before breakfast.   No facility-administered encounter medications on file as of 05/06/2020.   ALLERGIES: No Known Allergies  VACCINATION STATUS: Immunization History  Administered Date(s) Administered  . Fluad Quad(high Dose 65+) 11/20/2019  . Influenza, High Dose Seasonal PF 12/25/2016, 01/07/2018  . Influenza-Unspecified 01/21/2015  . Moderna SARS-COV2 Booster Vaccination 12/31/2019  . Moderna Sars-Covid-2 Vaccination 04/01/2019, 04/29/2019  . Pneumococcal Polysaccharide-23 03/11/2020  . Tdap 12/24/2018    Thyroid Problem Presents for follow-up visit. Symptoms include constipation, fatigue and weight gain. Patient reports no anxiety, cold intolerance, depressed mood, heat intolerance, palpitations or tremors. The symptoms have been stable.   [name] is 80 y.o. female who is being engaged in telehealth via telephone in follow-up after she was seen in consultation for hypothyroidism.    PCP:  Ivy Lynn, NP.  Patient is assisted by her daughter during visit.  She was sent for a more complete thyroid function test after her recent visit.  Her labs confirm primary hypothyroidism. Of note, she was treated with amiodarone  for almost a year due to cardiac dysrhythmia .  Patient denies any family history of thyroid dysfunction, except for her sister who had nodules on her thyroid requiring biopsy which were benign.   She reports weight gain, fatigue, cold intolerance, constipation.   Review of systems  Constitutional: + steadily increasing body weight,  current Body mass index is 31.07 kg/m. , + fatigue, no subjective hyperthermia, no subjective hypothermia Eyes: no blurry vision, no xerophthalmia ENT: no sore throat, no nodules palpated in throat, no dysphagia/odynophagia, no hoarseness Cardiovascular: no chest pain, no shortness of breath, no palpitations, no leg swelling Respiratory: no cough, no shortness of breath Gastrointestinal: no nausea/vomiting/diarrhea, + constipation Musculoskeletal: no muscle/joint aches Skin: no rashes, no hyperemia Neurological: no tremors, no numbness, no tingling, no dizziness Psychiatric: no depression, no anxiety  Objective:    BP (!) 181/81 (BP Location: Right Arm, Patient Position: Sitting)   Pulse 67   Ht 5\' 4"  (1.626 m)   Wt 181 lb (82.1 kg)   LMP  (LMP Unknown)   BMI 31.07 kg/m   Wt Readings from Last 3 Encounters:  05/06/20 181 lb (82.1 kg)  04/14/20 178 lb (80.7 kg)  04/01/20 179 lb (81.2 kg)    BP Readings from Last 3 Encounters:  05/06/20 (!) 181/81  04/14/20 126/74  04/01/20 (!) 162/64     Physical Exam- Limited  Constitutional:  Body mass index is 31.07 kg/m. , not in acute distress, normal state of mind Eyes:  EOMI, no exophthalmos Neck: Supple Thyroid: + mild goiter Cardiovascular: RRR, no murmers, rubs, or gallops, no edema Respiratory: Adequate breathing efforts, no crackles, rales, rhonchi, or wheezing Musculoskeletal: no gross deformities, strength intact in all four extremities, no gross restriction of joint movements Skin:  no rashes, no hyperemia Neurological: no tremor with outstretched hands  CMP ( most recent) CMP  Component Value Date/Time   NA 138 03/11/2020 0325   NA 136 (A) 08/19/2019 0000   K 3.7 03/11/2020 0325   CL 105 03/11/2020 0325   CO2 23 03/11/2020 0325   GLUCOSE 147 (H) 03/11/2020 0325   BUN 9 03/11/2020 0325   BUN 12 08/19/2019 0000   CREATININE 0.86 03/11/2020 0325   CALCIUM 8.8 (L) 03/11/2020 0325   PROT 7.5 03/08/2020 1112   PROT 6.7 04/02/2018 1118   ALBUMIN 4.3 03/08/2020 1112   ALBUMIN 4.3 04/02/2018 1118   AST 20 03/08/2020 1112   ALT 16 03/08/2020 1112   ALKPHOS 67 03/08/2020 1112   BILITOT 0.4 03/08/2020 1112   BILITOT 0.3 04/02/2018 1118   GFRNONAA >60 03/11/2020 0325   GFRAA >60 10/21/2019 1347     Diabetic Labs (most recent): Lab Results  Component Value Date   HGBA1C 6.5 (H) 03/10/2020   HGBA1C 5.7 08/19/2019   HGBA1C 7.5 (H) 12/17/2017     Lipid Panel ( most recent) Lipid Panel     Component Value Date/Time   CHOL 124 03/09/2020 0209   TRIG 58 03/09/2020 0209   HDL 51 03/09/2020 0209   CHOLHDL 2.4 03/09/2020 0209   VLDL 12 03/09/2020 0209   LDLCALC 61 03/09/2020 0209      Lab Results  Component Value Date   TSH 6.335 (H) 05/05/2020   TSH 10.867 (H) 03/03/2020   TSH 20.523 (H) 02/03/2020   TSH 16.400 (H) 01/30/2020   TSH 11.769 (H) 11/11/2019   TSH 5.290 (H) 08/12/2019   TSH 1.134 11/13/2018   TSH 1.422 12/17/2017   TSH 0.564 12/01/2016   FREET4 0.95 05/05/2020   FREET4 0.65 03/03/2020   FREET4 0.44 (L) 02/03/2020     Thyroid US on 04/29/20 CLINICAL DATA:  Hyperthyroidism  EXAM: THYROID ULTRASOUND  TECHNIQUE: Ultrasound examination of the thyroid gland and adjacent soft tissues was performed.  COMPARISON:  None.  FINDINGS: Parenchymal Echotexture: Mildly heterogeneous  Isthmus: 0.7 cm  Right lobe: 6.2 x 2.6 x 2.2 cm  Left lobe: 5.0 x 2.1 x 2.2 cm  _________________________________________________________  Estimated total number of nodules >/= 1 cm: 7  Number of spongiform nodules >/=  2 cm not described  below (TR1): 0  Number of mixed cystic and solid nodules >/= 1.5 cm not described below (TR2): 0  _________________________________________________________  Nodule # 1:  Location: Right; superior  Maximum size: 1.3 cm; Other 2 dimensions: 1.3 x 1.0 cm  Composition: solid/almost completely solid (2)  Echogenicity: hypoechoic (2)  Shape: taller-than-wide (3)  Margins: smooth (0)  Echogenic foci: none (0)  ACR TI-RADS total points: 7.  ACR TI-RADS risk category: TR5 (>/= 7 points).  ACR TI-RADS recommendations:  **Given size (>/= 1.0 cm) and appearance, fine needle aspiration of this highly suspicious nodule should be considered based on TI-RADS criteria.  _________________________________________________________  Nodule # 2:  Location: Right; superior  Maximum size: 2.2 cm; Other 2 dimensions: 1.7 x 1.7 cm  Composition: solid/almost completely solid (2)  Echogenicity: isoechoic (1)  Shape: taller-than-wide (3)  Margins: smooth (0)  Echogenic foci: none (0)  ACR TI-RADS total points: 4.  ACR TI-RADS risk category: TR4 (4-6 points).  ACR TI-RADS recommendations:  **Given size (>/= 1.5 cm) and appearance, fine needle aspiration of this moderately suspicious nodule should be considered based on TI-RADS criteria.  _________________________________________________________  Nodule # 3:  Location: Right; Mid  Maximum size: 2.3 cm; Other 2 dimensions: 1.7 x 2.2 cm  Composition: solid/almost completely solid (  2)  Echogenicity: hypoechoic (2)  Shape: not taller-than-wide (0)  Margins: smooth (0)  Echogenic foci: none (0)  ACR TI-RADS total points: 4.  ACR TI-RADS risk category: TR4 (4-6 points).  ACR TI-RADS recommendations:  **Given size (>/= 1.5 cm) and appearance, fine needle aspiration of this moderately suspicious nodule should be considered based on TI-RADS  criteria.  _________________________________________________________  Nodule # 4:  Location: Right; Inferior  Maximum size: 1.6 cm; Other 2 dimensions: 1.0 x 1.2 cm  Composition: mixed cystic and solid (1)  Echogenicity: isoechoic (1)  Shape: not taller-than-wide (0)  Margins: smooth (0)  Echogenic foci: none (0)  ACR TI-RADS total points: 2.  ACR TI-RADS risk category: TR2 (2 points).  ACR TI-RADS recommendations:  This nodule does NOT meet TI-RADS criteria for biopsy or dedicated follow-up.  _________________________________________________________  Nodule # 5:  Location: Left; superior  Maximum size: 1.8 cm; Other 2 dimensions: 1.3 x 1.8 cm  Composition: solid/almost completely solid (2)  Echogenicity: isoechoic (1)  Shape: not taller-than-wide (0)  Margins: smooth (0)  Echogenic foci: none (0)  ACR TI-RADS total points: 3.  ACR TI-RADS risk category: TR3 (3 points).  ACR TI-RADS recommendations:  *Given size (>/= 1.5 - 2.4 cm) and appearance, a follow-up ultrasound in 1 year should be considered based on TI-RADS criteria.  _________________________________________________________  Nodule # 6:  Location: Left; mid  Maximum size: 1.9 cm; Other 2 dimensions: 1.7 x 1.8 cm  Composition: solid/almost completely solid (2)  Echogenicity: isoechoic (1)  Shape: taller-than-wide (3)  Margins: ill-defined (0)  Echogenic foci: none (0)  ACR TI-RADS total points: 6.  ACR TI-RADS risk category: TR4 (4-6 points).  ACR TI-RADS recommendations:  **Given size (>/= 1.5 cm) and appearance, fine needle aspiration of this moderately suspicious nodule should be considered based on TI-RADS criteria.  _________________________________________________________  Nodule # 7: 1.1 cm cystic nodule in the inferior left thyroid lobe does not meet criteria for FNA or imaging follow-up.  IMPRESSION: 1. Nodule 1  (TI-RADS 5) located in the superior right thyroid lobe, measuring 1.3 x 1.3 x 1.0 cm, meets criteria for FNA. 2. Nodule 2 (TI-RADS 4), located in the superior right thyroid lobe, measuring 2.2 x 1.7 x 1.7 cm, meets criteria for FNA. 3. Nodule 3 (TI-RADS 4) located in the mid right thyroid lobe, measuring 2.3 x 1.7 x 2.2 cm, meets criteria for FNA. 4. Nodule 5 (TI-RADS 3) located in the superior left thyroid lobe, measuring 1.8 x 1.3 x 1.8 cm, meets criteria for imaging follow-up. Next ultrasound should be performed in 1 year. 5. Nodule 6 (TI-RADS 4), located in the mid left thyroid lobe, measuring 1.9 x 1.7 x 1.8 cm, meets criteria for FNA. 6. Order of suspicion of nodules for consideration for FNA: 1, 3, 2, 6  The above is in keeping with the ACR TI-RADS recommendations - J Am Coll Radiol 2017;14:587-595.   Electronically Signed   By: Miachel Roux M.D.   On: 04/29/2020 16:12  Assessment & Plan:   1. 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  She is advised to increase her Levothyroxine to 88 mcg po daily before breakfast.   - We discussed about the correct intake of her thyroid hormone, on empty stomach at fasting, with water, separated by at least 30 minutes from breakfast and other medications,  and separated by more than 4 hours from calcium, iron, multivitamins, acid reflux medications (PPIs). -Patient is made aware of the fact  that thyroid hormone replacement is needed for life, dose to be adjusted by periodic monitoring of thyroid function tests.   2. Multinodular Goiter -Her recent thyroid ultrasound shows multinodular goiter with 4 moderately suspicious nodules that meet criteria for FNA and 1 nodule recommending follow up with ultrasound in 1 year.  I discussed these findings with her and her daughter, and they both agreed to proceed with biopsy of the suspicious nodules to rule out  malignancy.   - she is advised to maintain close follow up with Ivy Lynn, NP for primary care needs.       - Time spent on this patient care encounter:  30 minutes of which 50% was spent in  counseling and the rest reviewing  her current and  previous labs / studies and medications  doses and developing a plan for long term care, and documenting this care. [name]  participated in the discussions, expressed understanding, and voiced agreement with the above plans.  All questions were answered to her satisfaction. she is encouraged to contact clinic should she have any questions or concerns prior to her return visit.   Follow up plan: Return in about 3 months (around 08/06/2020) for Thyroid follow up, Previsit labs, thyroid biopsy.   Rayetta Pigg, [facility] [address] [place], [place] [address] Phone: [phone] Fax: [phone]     05/06/2020, 11:29 AM

## 2020-05-06 NOTE — Patient Instructions (Signed)
Thyroid Nodule  A thyroid nodule is an isolated growth of thyroid cells that forms a lump in your thyroid gland. The thyroid gland is a butterfly-shaped gland. It is found in the lower front of your neck. This gland sends chemical messengers (hormones) through your blood to all parts of your body. These hormones are important in regulating your body temperature and helping your body to use energy. Thyroid nodules are common. Most are not cancerous (benign). You may have one nodule or several nodules. Different types of thyroid nodules include nodules that:  Grow and fill with fluid (thyroid cysts).  Produce too much thyroid hormone (hot nodules or hyperthyroid).  Produce no thyroid hormone (cold nodules or hypothyroid).  Form from cancer cells (thyroid cancers). What are the causes? In most cases, the cause of this condition is not known. What increases the risk? The following factors may make you more likely to develop this condition.  Age. Thyroid nodules become more common in people who are older than 80 years of age.  Gender. ? Benign thyroid nodules are more common in women. ? Cancerous (malignant) thyroid nodules are more common in men.  A family history that includes: ? Thyroid nodules. ? Pheochromocytoma. ? Thyroid carcinoma. ? Hyperparathyroidism.  Certain kinds of thyroid diseases, such as Hashimoto's thyroiditis.  Lack of iodine in your diet.  A history of head and neck radiation, such as from previous cancer treatment. What are the signs or symptoms? In many cases, there are no symptoms. If you have symptoms, they may include:  A lump in your lower neck.  Feeling a lump or tickle in your throat.  Pain in your neck, jaw, or ear.  Having trouble swallowing. Hot nodules may cause symptoms that include:  Weight loss.  Warm, flushed skin.  Feeling hot.  Feeling nervous.  A racing heartbeat. Cold nodules may cause symptoms that include:  Weight  gain.  Dry skin.  Brittle hair. This may also occur with hair loss.  Feeling cold.  Fatigue. Thyroid cancer nodules may cause symptoms that include:  Hard nodules that feel stuck to the thyroid gland.  Hoarseness.  Lumps in the glands near your thyroid (lymph nodes). How is this diagnosed? A thyroid nodule may be felt by your health care provider during a physical exam. This condition may also be diagnosed based on your symptoms. You may also have tests, including:  An ultrasound. This may be done to confirm the diagnosis.  A biopsy. This involves taking a sample from the nodule and looking at it under a microscope.  Blood tests to make sure that your thyroid is working properly.  A thyroid scan. This test uses a radioactive tracer injected into a vein to create an image of the thyroid gland on a computer screen.  Imaging tests such as MRI or CT scan. These may be done if: ? Your nodule is large. ? Your nodule is blocking your airway. ? Cancer is suspected. How is this treated? Treatment depends on the cause and size of your nodule or nodules. If the nodule is benign, treatment may not be necessary. Your health care provider may monitor the nodule to see if it goes away without treatment. If the nodule continues to grow, is cancerous, or does not go away, treatment may be needed. Treatment may include:  Having a cystic nodule drained with a needle.  Ablation therapy. In this treatment, alcohol is injected into the area of the nodule to destroy the cells. Ablation with heat (  thermal ablation) may also be used.  Radioactive iodine. In this treatment, radioactive iodine is given as a pill or liquid that you drink. This substance causes the thyroid nodule to shrink.  Surgery to remove the nodule. Part or all of your thyroid gland may need to be removed as well.  Medicines. Follow these instructions at home:  Pay attention to any changes in your nodule.  Take  over-the-counter and prescription medicines only as told by your health care provider.  Keep all follow-up visits as told by your health care provider. This is important. Contact a health care provider if:  Your voice changes.  You have trouble swallowing.  You have pain in your neck, ear, or jaw that is getting worse.  Your nodule gets bigger.  Your nodule starts to make it harder for you to breathe.  Your muscles look like they are shrinking (muscle wasting). Get help right away if:  You have chest pain.  There is a loss of consciousness.  You have a sudden fever.  You feel confused.  You are seeing or hearing things that other people do not see or hear (having hallucinations).  You feel very weak.  You have mood swings.  You feel very restless.  You feel suddenly nauseous or throw up.  You suddenly have diarrhea. Summary  A thyroid nodule is an isolated growth of thyroid cells that forms a lump in your thyroid gland.  Thyroid nodules are common. Most are not cancerous (benign). You may have one nodule or several nodules.  Treatment depends on the cause and size of your nodule or nodules. If the nodule is benign, treatment may not be necessary.  Your health care provider may monitor the nodule to see if it goes away without treatment. If the nodule continues to grow, is cancerous, or does not go away, treatment may be needed. This information is not intended to replace advice given to you by your health care provider. Make sure you discuss any questions you have with your health care provider. Document Revised: 09/21/2017 Document Reviewed: 09/24/2017 Elsevier Patient Education  [place].

## 2020-05-06 NOTE — Progress Notes (Signed)
[name] Female, 80 y.o., August 04, 1940  MRN:  [id number] Phone:  [phone] [name]       PCP:  Ivy Lynn, NP Coverage:  [place] [facility] (AP-US 1) 05/13/2020 at 11:00 AM           RE: Thyroid BX Received: Today Brita Romp, NP  Garth Bigness D  Yes, that will be fine to hold those meds as you recommended.        Previous Messages   ----- Message -----  From: Garth Bigness D  Sent: 05/06/2020 11:35 AM EDT  To: Brita Romp, NP  Subject: Thyroid BX                    Good morning, patient is on Eliquis and Plavix which will both need to be held prior to biopsy. Please advise if okay to hold Eliquis for 2 days prior and Plavix for 5 days prior.  Thanks

## 2020-05-13 ENCOUNTER — Ambulatory Visit (HOSPITAL_COMMUNITY)
Admission: RE | Admit: 2020-05-13 | Discharge: 2020-05-13 | Disposition: A | Discharge status: Home / Self Care | Payer: Medicare Other | Source: Ambulatory Visit | Attending: Nurse Practitioner | Admitting: Nurse Practitioner

## 2020-05-13 ENCOUNTER — Other Ambulatory Visit: Payer: Self-pay | Admitting: Radiology

## 2020-05-13 ENCOUNTER — Other Ambulatory Visit: Payer: Self-pay

## 2020-05-13 ENCOUNTER — Encounter (HOSPITAL_COMMUNITY): Payer: Self-pay

## 2020-05-13 ENCOUNTER — Other Ambulatory Visit: Payer: Self-pay | Admitting: Nurse Practitioner

## 2020-05-13 DIAGNOSIS — E042 Nontoxic multinodular goiter: Secondary | ICD-10-CM

## 2020-05-13 DIAGNOSIS — D34 Benign neoplasm of thyroid gland: Secondary | ICD-10-CM | POA: Insufficient documentation

## 2020-05-13 MED ORDER — LIDOCAINE HCL (PF) 2 % IJ SOLN
10.0000 mL | Freq: Once | INTRAMUSCULAR | Status: AC
  Administered 2020-05-13: 10 mL

## 2020-05-13 NOTE — Discharge Instructions (Signed)
Thyroid Needle Biopsy, Care After This sheet gives you information about how to care for yourself after your procedure. Your health care provider may also give you more specific instructions. If you have problems or questions, contact your health care provider. What can I expect after the procedure? After the procedure, it is common to have:  Soreness and tenderness that lasts for a few days.  Bruising where the needle was inserted (puncture site). Follow these instructions at home:  Take over-the-counter and prescription medicines only as told by your health care provider.  To help ease discomfort, keep your head raised (elevated) when you are lying down. When you move from lying down to sitting up, use both hands to support the back of your head and neck.  Check your puncture site every day for signs of infection. Check for: ? Redness, swelling, or pain. ? Fluid or blood. ? Warmth. ? Pus or a bad smell.  Return to your normal activities as told by your health care provider. Ask your health care provider what activities are safe for you.  Keep all follow-up visits as told by your health care provider. This is important.   Contact a health care provider if:  You have redness, swelling, or pain around your puncture site.  You have fluid or blood coming from your puncture site.  Your puncture site feels warm to the touch.  You have pus or a bad smell coming from your puncture site.  You have a fever. Get help right away if:  You have severe bleeding from the puncture site.  You have difficulty swallowing.  You have swollen glands (lymph nodes) in your neck. Summary  It is common to have some bruising and soreness where the needle was inserted in your lower front neck area (puncture site).  Check your puncture site every day for signs of infection, such as redness, swelling, or pain.  Get help right away if you have severe bleeding from your puncture site. This  information is not intended to replace advice given to you by your health care provider. Make sure you discuss any questions you have with your health care provider. Document Revised: 10/16/2019 Document Reviewed: 10/16/2019 Elsevier Patient Education  2021 Elsevier Inc.  

## 2020-05-13 NOTE — Sedation Documentation (Signed)
PT tolerated thyroid biopsy procedure well today. Labs obtained and sent for pathology. PT ambulatory at discharge with no acute distress noted and verbalized understanding of discharge instructions.

## 2020-05-14 LAB — CYTOLOGY - NON PAP

## 2020-05-14 NOTE — Progress Notes (Signed)
Carelink Summary Report / Loop Recorder 

## 2020-05-17 ENCOUNTER — Other Ambulatory Visit: Payer: Self-pay | Admitting: Cardiology

## 2020-05-18 DIAGNOSIS — E042 Nontoxic multinodular goiter: Secondary | ICD-10-CM | POA: Diagnosis not present

## 2020-05-27 ENCOUNTER — Telehealth: Payer: Self-pay | Admitting: Nurse Practitioner

## 2020-05-27 NOTE — Telephone Encounter (Signed)
Pts daughter called concerning the Thyroid biopsy results. I see results but are the molecular studies still pending? I dont see anything that says pending.

## 2020-05-27 NOTE — Telephone Encounter (Signed)
We have the results of 1 nodule biopsy back- the right upper lobe biopsy was benign.  The other (right middle lobe) was sent off for additional testing cause the cells were a bit abnormal and they couldn't determine the significance of that.  The send off test takes a bit more time to receive back (sometimes up to 6 weeks).

## 2020-05-27 NOTE — Telephone Encounter (Signed)
Pt's daughter notified.

## 2020-06-01 ENCOUNTER — Encounter (HOSPITAL_COMMUNITY): Payer: Self-pay

## 2020-06-03 NOTE — Telephone Encounter (Signed)
Please advise 

## 2020-06-03 NOTE — Telephone Encounter (Signed)
Pt is calling to see if we have the results of her biopsy back yet, she states she has not heard anything yet. [phone]

## 2020-06-03 NOTE — Telephone Encounter (Signed)
No, nothing yet

## 2020-06-03 NOTE — Telephone Encounter (Signed)
Returned call to patient and advised the report is not back yet, it could be up to 6 weeks from date it was sent out. Patient verbalized understanding.

## 2020-06-07 ENCOUNTER — Ambulatory Visit (INDEPENDENT_AMBULATORY_CARE_PROVIDER_SITE_OTHER): Payer: Medicare Other

## 2020-06-07 DIAGNOSIS — I480 Paroxysmal atrial fibrillation: Secondary | ICD-10-CM | POA: Diagnosis not present

## 2020-06-08 LAB — CUP PACEART REMOTE DEVICE CHECK
Date Time Interrogation Session: 20220418204359
Implantable Pulse Generator Implant Date: 20220210

## 2020-06-09 ENCOUNTER — Telehealth: Payer: Self-pay | Admitting: Internal Medicine

## 2020-06-24 DIAGNOSIS — M79676 Pain in unspecified toe(s): Secondary | ICD-10-CM | POA: Diagnosis not present

## 2020-06-24 DIAGNOSIS — L84 Corns and callosities: Secondary | ICD-10-CM | POA: Diagnosis not present

## 2020-06-24 DIAGNOSIS — E1142 Type 2 diabetes mellitus with diabetic polyneuropathy: Secondary | ICD-10-CM | POA: Diagnosis not present

## 2020-06-24 DIAGNOSIS — B351 Tinea unguium: Secondary | ICD-10-CM | POA: Diagnosis not present

## 2020-06-24 NOTE — Progress Notes (Signed)
Carelink Summary Report / Loop Recorder 

## 2020-07-05 NOTE — Progress Notes (Signed)
Cardiology Office Note  Date: 07/06/2020   ID: [name], DOB June 08, 1940, MRN [id number]  PCP:  Ivy Lynn, NP  Cardiologist:  Rozann Lesches, MD Electrophysiologist:  Thompson Grayer, MD   Chief Complaint  Patient presents with  . Cardiac follow-up    History of Present Illness: [name] is an 80 y.o. female last seen in February by Ms. Strader PA-C.  She presents for a routine visit.  Reports no sense of palpitations, no angina symptoms or nitroglycerin use.  Also no change in dyspnea on exertion which at this point is NYHA class II.  She has had some trouble with lower back pain and I see that she has evidence of lumbar spine disease by MRI in December 2021.  She had a follow-up visit with Dr. Rayann Heman in February at which point ILR was placed.  Interrogation in April showed normal function with three previously documented episodes of AF.  She is in atrial fibrillation today by ECG, not aware of any symptoms.  I reviewed her medications.  She does not report any bleeding problems on combination of Eliquis and Plavix.  Past Medical History:  Diagnosis Date  . Anxiety   . Arthritis   . Bulging lumbar disc   . Chronic lower back pain   . Coronary atherosclerosis of native coronary artery    DES LAD 02/2008, DES LAD 02/2015, DES ramus and DES x2 mid RCA 02/2020  . Depression   . Dyslipidemia   . Essential hypertension   . Facial numbness   . Frequent headaches   . GERD (gastroesophageal reflux disease)   . Glaucoma   . Heart attack ([place])   . Insomnia   . Paroxysmal atrial fibrillation [facility])    Diagnosed October 2019  . PSVT (paroxysmal supraventricular tachycardia) ([place])   . Refusal of blood transfusions as patient is Jehovah's Witness   . Thyroid disease   . Type 2 diabetes mellitus (H[facility])    Borderline  . Vitamin D deficiency     Past Surgical History:  Procedure Laterality Date  . CARDIAC CATHETERIZATION N/A 02/23/2015   Procedure: Left Heart Cath and  Coronary Angiography;  Surgeon: Sherren Mocha, MD;  Location: [place] [facility];  Service: Cardiovascular;  Laterality: N/A;  . CARDIAC CATHETERIZATION N/A 02/23/2015   Procedure: Coronary Stent Intervention;  Surgeon: Sherren Mocha, MD;  Location: [place] [facility];  Service: Cardiovascular;  Laterality: N/A;  . CARDIAC CATHETERIZATION N/A 01/04/2016   Procedure: Left Heart Cath and Coronary Angiography;  Surgeon: Peter M Martinique, MD;  Location: [place] [facility];  Service: Cardiovascular;  Laterality: N/A;  . CATARACT EXTRACTION    . CORONARY ANGIOPLASTY  02/23/2015  . CORONARY ANGIOPLASTY WITH STENT PLACEMENT  2009  . CORONARY STENT INTERVENTION N/A 03/09/2020   Procedure: CORONARY STENT INTERVENTION;  Surgeon: Jettie Booze, MD;  Location: [place] [facility];  Service: Cardiovascular;  Laterality: N/A;  . DILATION AND CURETTAGE OF UTERUS    . implantable loop recorder placement  04/01/2020   Medtronic Reveal Linq model LNQ [id number]) implantable loop recorder   . LEFT HEART CATH AND CORONARY ANGIOGRAPHY N/A 03/09/2020   Procedure: LEFT HEART CATH AND CORONARY ANGIOGRAPHY;  Surgeon: Jettie Booze, MD;  Location: [place] [facility];  Service: Cardiovascular;  Laterality: N/A;  . TUBAL LIGATION      Current Outpatient Medications  Medication Sig Dispense Refill  . amitriptyline (ELAVIL) 10 MG tablet Take 1 tablet (10 mg  total) by mouth at bedtime. 60 tablet 2  . apixaban (ELIQUIS) 5 MG TABS tablet Take 1 tablet (5 mg total) by mouth 2 (two) times daily. 28 tablet 0  . Ascorbic Acid (VITAMIN C) 500 MG tablet Take 500 mg by mouth daily.      . bimatoprost (LUMIGAN) 0.01 % SOLN Place 1 drop into both eyes at bedtime.    . Calcium Carbonate Antacid (TUMS PO) Take 2 tablets by mouth 4 (four) times daily as needed (for acid reflux/indigestion).     . Cholecalciferol (VITAMIN D) 400 UNITS capsule Take 400 Units by mouth daily.      . clopidogrel (PLAVIX) 75 MG tablet Take 1  tablet (75 mg total) by mouth daily with breakfast. 90 tablet 3  . Cyanocobalamin (VITAMIN B 12 PO) Take 1 tablet by mouth daily.    . diclofenac Sodium (VOLTAREN) 1 % GEL Apply 2 g topically 4 (four) times daily. (Patient taking differently: Apply 2 g topically 4 (four) times daily. As needed) 50 g 2  . diltiazem (CARDIZEM CD) 240 MG 24 hr capsule Take 1 capsule (240 mg total) by mouth daily. 90 capsule 3  . Garlic 703 MG TABS Take 1 tablet by mouth daily.    Marland Kitchen HYDROcodone-acetaminophen (NORCO) 10-325 MG tablet Take 1 tablet by mouth 4 (four) times daily as needed. For pain.    Marland Kitchen levothyroxine (SYNTHROID) 88 MCG tablet Take 1 tablet (88 mcg total) by mouth daily before breakfast. 30 tablet 3  . losartan (COZAAR) 50 MG tablet Take 1 tablet (50 mg total) by mouth daily. 90 tablet 3  . MAGNESIUM CARBONATE PO Take 1 tablet by mouth daily. 400 mg    . metFORMIN (GLUCOPHAGE-XR) 500 MG 24 hr tablet Take 1 tablet by mouth daily.     . metoprolol tartrate (LOPRESSOR) 100 MG tablet TAKE ONE TABLET BY MOUTH TWICE DAILY 180 tablet 3  . nitroGLYCERIN (NITROSTAT) 0.4 MG SL tablet Place 0.4 mg under the tongue every 5 (five) minutes as needed for chest pain.    . promethazine (PHENERGAN) 25 MG tablet 1 tablet as needed.    . rosuvastatin (CRESTOR) 20 MG tablet Take 1 tablet (20 mg total) by mouth daily. 90 tablet 3  . timolol (TIMOPTIC) 0.5 % ophthalmic solution Place 1 drop into both eyes daily.    . traZODone (DESYREL) 100 MG tablet Take 1 tablet by mouth at bedtime.     . TURMERIC PO Take 1 tablet by mouth daily.    . VENTOLIN HFA 108 (90 Base) MCG/ACT inhaler Inhale 1-2 puffs into the lungs every 4 (four) hours as needed for shortness of breath.     No current facility-administered medications for this visit.   Allergies:  Patient has no known allergies.   ROS: No orthopnea or PND.  Physical Exam: VS:  BP 110/62   Pulse 94   Ht 5\' 5"  (1.651 m)   Wt 173 lb (78.5 kg)   LMP  (LMP Unknown)   SpO2  98%   BMI 28.79 kg/m , BMI Body mass index is 28.79 kg/m.  Wt Readings from Last 3 Encounters:  07/06/20 173 lb (78.5 kg)  05/06/20 181 lb (82.1 kg)  04/14/20 178 lb (80.7 kg)    General: Elderly woman, appears comfortable at rest. HEENT: Conjunctiva and lids normal, wearing a mask. Neck: Supple, no elevated JVP or carotid bruits, no thyromegaly. Lungs: Clear to auscultation, nonlabored breathing at rest. Cardiac: Irregularly irregular, no S3, 1/6 systolic murmur,  no pericardial rub. Extremities: No pitting edema.  ECG:  An ECG dated 04/01/2020 was personally reviewed today and demonstrated:  Sinus rhythm with PACs, nonspecific ST-T changes.  Recent Labwork: 03/08/2020: ALT 16; AST 20 03/11/2020: BUN 9; Creatinine, Ser 0.86; Hemoglobin 11.1; Magnesium 1.9; Platelets 164; Potassium 3.7; Sodium 138 05/05/2020: TSH 6.335     Component Value Date/Time   CHOL 124 03/09/2020 0209   TRIG 58 03/09/2020 0209   HDL 51 03/09/2020 0209   CHOLHDL 2.4 03/09/2020 0209   VLDL 12 03/09/2020 0209   LDLCALC 61 03/09/2020 0209   Other Studies Reviewed Today:  Cardiac catheterization 03/09/2020:  Prior LAD stents were patient.  Ramus-1 lesion is 95% stenosed. A drug-eluting stent was successfully placed using a STENT RESOLUTE ONYX 2.5X22, postdilated to 2.75 mm. T  Post intervention, there is a 0% residual stenosis.  Ramus-2 lesion is 50% stenosed in tortuous segment distal to the stent.  Post intervention, there is a 0% residual stenosis.  Mid RCA-1 lesion is 75% stenosed. A drug-eluting stent was successfully placed using a STENT RESOLUTE ONYX 2.5X26, postdilated to 2.75 mm.  Post intervention, there is a 0% residual stenosis.  Mid RCA-2 lesion is 75% stenosed, likely edge dissection in tortuous segment. A drug-eluting stent was successfully placed using a STENT RESOLUTE ONYX 2.5X8, A drug-eluting stent was successfully placed using a STENT RESOLUTE ONYX 2.5X8, postdilated to 2.75  mm.  Ost Cx to Prox Cx lesion is 50% stenosed.  The left ventricular systolic function is normal.  LV end diastolic pressure is mildly elevated.  The left ventricular ejection fraction is 50-55% by visual estimate.  There is no aortic valve stenosis.  A drug-eluting stent was successfully placed using a STENT RESOLUTE ONYX 2.5X22.  A drug-eluting stent was successfully placed using a STENT RESOLUTE ONYX 2.5X26.  A drug-eluting stent was successfully placed using a STENT RESOLUTE ONYX 2.5X8.  Restart Eliquis tomorrow. Aspirin for only 1 week since I am concerned about her bleeding risk. Clopidogrel for 1 year if tolerated from a bleeding standpoint. At minimum, would want at least 6 months of clopidogrel.   Echocardiogram 03/10/2020: 1. Anterior and basal to mid anterolateral hypokinesis. Posterolateral  hypokinesis. Left ventricular ejection fraction, by estimation, is 55 to  60%. The left ventricle has normal function. The left ventricle  demonstrates regional wall motion  abnormalities (see scoring diagram/findings for description). There is  mild concentric left ventricular hypertrophy. Left ventricular diastolic  parameters are consistent with Grade II diastolic dysfunction  (pseudonormalization). Elevated left ventricular  end-diastolic pressure.  2. Right ventricular systolic function is normal. The right ventricular  size is normal. There is moderately elevated pulmonary artery systolic  pressure.  3. Right atrial size was mildly dilated.  4. The mitral valve is normal in structure. Mild mitral valve  regurgitation. No evidence of mitral stenosis.  5. Tricuspid valve regurgitation is moderate to severe.  6. The aortic valve is normal in structure. There is mild calcification  of the aortic valve. There is mild thickening of the aortic valve. Aortic  valve regurgitation is moderate. No aortic stenosis is present. Aortic  regurgitation PHT measures 457 msec.   7. The inferior vena cava is normal in size with greater than 50%  respiratory variability, suggesting right atrial pressure of 3 mmHg.   Assessment and Plan:  1.  CAD status post DES to the LAD in 2010, DES to the LAD in 2017, and most recently DES to the ramus intermedius with DES x2  to the RCA in January of this year.  She reports no active angina at this time. Off aspirin but continues on Plavix given concurrent use of Eliquis.  Continue Cardizem CD, Cozaar, Lopressor, Crestor, and as needed nitroglycerin.  2.  Paroxysmal to persistent atrial fibrillation with CHA2DS2-VASc score of 6.  She is asymptomatic in terms of palpitations, noted to be in rate controlled atrial fibrillation today.  ILR placed by Dr. Rayann Heman to get a better sense of rhythm burden.  May ultimately be a situation that we manage with heart rate control and anticoagulation strategy long-term.  3.  Acquired thrombophilia, on Eliquis for stroke prophylaxis.  No reported bleeding problems.  4.  Mixed hyperlipidemia, on Crestor.  Last LDL 61.  Medication Adjustments/Labs and Tests Ordered: Current medicines are reviewed at length with the patient today.  Concerns regarding medicines are outlined above.   Tests Ordered: Orders Placed This Encounter  Procedures  . EKG 12-Lead    Medication Changes: No orders of the defined types were placed in this encounter.   Disposition:  Follow up 6 months.  Signed, Satira Sark, MD, [facility] 07/06/2020 9:19 AM    [facility] [place], [place], [place] [address] Phone: [phone]; Fax: [phone]

## 2020-07-06 ENCOUNTER — Ambulatory Visit: Payer: Medicare Other | Admitting: Cardiology

## 2020-07-06 ENCOUNTER — Other Ambulatory Visit: Payer: Self-pay | Admitting: *Deleted

## 2020-07-06 ENCOUNTER — Encounter: Payer: Self-pay | Admitting: Cardiology

## 2020-07-06 VITALS — BP 110/62 | HR 94 | Ht 65.0 in | Wt 173.0 lb

## 2020-07-06 DIAGNOSIS — I25119 Atherosclerotic heart disease of native coronary artery with unspecified angina pectoris: Secondary | ICD-10-CM | POA: Diagnosis not present

## 2020-07-06 DIAGNOSIS — E782 Mixed hyperlipidemia: Secondary | ICD-10-CM

## 2020-07-06 DIAGNOSIS — I480 Paroxysmal atrial fibrillation: Secondary | ICD-10-CM

## 2020-07-06 DIAGNOSIS — R200 Anesthesia of skin: Secondary | ICD-10-CM

## 2020-07-06 DIAGNOSIS — D6869 Other thrombophilia: Secondary | ICD-10-CM | POA: Diagnosis not present

## 2020-07-06 MED ORDER — TRAZODONE HCL 100 MG PO TABS: 100.0000 mg | ORAL_TABLET | Freq: Every day | ORAL | 1 refills | Status: DC

## 2020-07-06 MED ORDER — AMITRIPTYLINE HCL 10 MG PO TABS
10.0000 mg | ORAL_TABLET | Freq: Every day | ORAL | 2 refills | Status: DC
Start: 2020-07-06 — End: 2021-01-03

## 2020-07-06 NOTE — Patient Instructions (Addendum)
Medication Instructions:   Your physician recommends that you continue on your current medications as directed. Please refer to the Current Medication list given to you today.  Labwork:  none  Testing/Procedures:  none  Follow-Up:  Your physician recommends that you schedule a follow-up appointment in: 6 months. You will receive a reminder call or letter in the mail in about 4 months reminding you to call and schedule your appointment. If you don't receive this letter, please contact our office.  Any Other Special Instructions Will Be Listed Below (If Applicable).  If you need a refill on your cardiac medications before your next appointment, please call your pharmacy.

## 2020-07-12 ENCOUNTER — Ambulatory Visit (INDEPENDENT_AMBULATORY_CARE_PROVIDER_SITE_OTHER): Payer: Medicare Other

## 2020-07-12 DIAGNOSIS — I480 Paroxysmal atrial fibrillation: Secondary | ICD-10-CM

## 2020-07-14 ENCOUNTER — Telehealth: Payer: Self-pay | Admitting: Cardiology

## 2020-07-14 ENCOUNTER — Telehealth: Payer: Self-pay

## 2020-07-14 NOTE — Telephone Encounter (Signed)
Spoke with patient and her daughter.  Pt has felt fatigued for >1 week.  She reports having intermittent feeling of tightness in her chest along with some SOB.  The episodes do not last long and mostly occur when she is laying down.  The pain/ tightness is not strong enough that she feels the need to take NTG.    Pt sent manual transmission from ILR.  It appears she did have a long episode of AF starting 5/16.  Presenting rhythm is irregular.    Pt confirmed compliance with current meds including Eliquis, Diltiazem 240 mg daily and Metoprolol 100mg  BID.    Pt previously seen in AF clinic, advised I would forward information to them for f/u.

## 2020-07-14 NOTE — Telephone Encounter (Signed)
Patient daughter called in stating this week her mom has been feeling really tired and she has had headaches and some vibration in her chest. They have sent a transmission in for the nurse to look at

## 2020-07-14 NOTE — Telephone Encounter (Signed)
Patient's daughter called stating that since patient was seen on 5/17 she is extreme fatigue with headaches. Daughter is concerned about patient's A.Fib.  Please call [phone]

## 2020-07-14 NOTE — Telephone Encounter (Signed)
Reports fatigue, SOB, tightness in chest when laying down that feels like a "vibrating sound" per daughter. Says she has an appointment tomorrow at 2:30 pm in [facility]. Advised to keep this appointment and if symptoms got worse, to go to the ED for an evaluation. Verbalized understanding of plan.

## 2020-07-14 NOTE — Telephone Encounter (Signed)
Called and spoke with patient's daughter, [facility] on file, she is agreeable to appt 07/15/20 at 2:30 pm with Adline Peals, PA-C

## 2020-07-15 ENCOUNTER — Encounter (HOSPITAL_COMMUNITY): Payer: Self-pay | Admitting: Physician Assistant

## 2020-07-15 ENCOUNTER — Other Ambulatory Visit: Payer: Self-pay

## 2020-07-15 ENCOUNTER — Ambulatory Visit (HOSPITAL_COMMUNITY)
Admission: RE | Admit: 2020-07-15 | Discharge: 2020-07-15 | Disposition: A | Discharge status: Home / Self Care | Payer: Medicare Other | Source: Ambulatory Visit | Attending: Physician Assistant | Admitting: Physician Assistant

## 2020-07-15 VITALS — BP 142/76 | HR 65 | Ht 65.0 in | Wt 179.4 lb

## 2020-07-15 DIAGNOSIS — Z79899 Other long term (current) drug therapy: Secondary | ICD-10-CM | POA: Diagnosis not present

## 2020-07-15 DIAGNOSIS — I10 Essential (primary) hypertension: Secondary | ICD-10-CM | POA: Insufficient documentation

## 2020-07-15 DIAGNOSIS — I4819 Other persistent atrial fibrillation: Secondary | ICD-10-CM | POA: Diagnosis not present

## 2020-07-15 DIAGNOSIS — I471 Supraventricular tachycardia: Secondary | ICD-10-CM | POA: Diagnosis not present

## 2020-07-15 DIAGNOSIS — E785 Hyperlipidemia, unspecified: Secondary | ICD-10-CM | POA: Insufficient documentation

## 2020-07-15 DIAGNOSIS — I080 Rheumatic disorders of both mitral and aortic valves: Secondary | ICD-10-CM | POA: Insufficient documentation

## 2020-07-15 DIAGNOSIS — I2510 Atherosclerotic heart disease of native coronary artery without angina pectoris: Secondary | ICD-10-CM | POA: Diagnosis not present

## 2020-07-15 DIAGNOSIS — I700 Atherosclerosis of aorta: Secondary | ICD-10-CM | POA: Insufficient documentation

## 2020-07-15 DIAGNOSIS — Z7901 Long term (current) use of anticoagulants: Secondary | ICD-10-CM | POA: Insufficient documentation

## 2020-07-15 DIAGNOSIS — I252 Old myocardial infarction: Secondary | ICD-10-CM | POA: Insufficient documentation

## 2020-07-15 DIAGNOSIS — R0600 Dyspnea, unspecified: Secondary | ICD-10-CM | POA: Diagnosis not present

## 2020-07-15 DIAGNOSIS — Z955 Presence of coronary angioplasty implant and graft: Secondary | ICD-10-CM | POA: Insufficient documentation

## 2020-07-15 DIAGNOSIS — I493 Ventricular premature depolarization: Secondary | ICD-10-CM | POA: Diagnosis not present

## 2020-07-15 DIAGNOSIS — D6869 Other thrombophilia: Secondary | ICD-10-CM | POA: Diagnosis not present

## 2020-07-15 DIAGNOSIS — I272 Pulmonary hypertension, unspecified: Secondary | ICD-10-CM | POA: Insufficient documentation

## 2020-07-15 LAB — CUP PACEART REMOTE DEVICE CHECK
Date Time Interrogation Session: 20220521204252
Implantable Pulse Generator Implant Date: 20220210

## 2020-07-15 MED ORDER — POTASSIUM CHLORIDE CRYS ER 10 MEQ PO TBCR: 10.0000 meq | EXTENDED_RELEASE_TABLET | Freq: Every day | ORAL | 1 refills | Status: DC | PRN

## 2020-07-15 MED ORDER — FUROSEMIDE 20 MG PO TABS: 20.0000 mg | ORAL_TABLET | Freq: Every day | ORAL | 1 refills | Status: DC | PRN

## 2020-07-15 NOTE — Patient Instructions (Signed)
Lasix 20mg  once a day for the next 3 days then only as needed for weight gain/swelling  Potassium (KDur) 58meq once a day for the next days then only as needed when lasix taken

## 2020-07-15 NOTE — Progress Notes (Signed)
Primary Care Physician: Ivy Lynn, NP Primary Cardiologist: Dr Domenic Polite Primary Electrophysiologist: Dr Rayann Heman Referring Physician: Dr Reinaldo Raddle [name] is a 80 y.o. female with a history of CAD, HTN, DM, HLD, and persistent atrial fibrillation who presents for follow up in the [facility]. The patient was initially diagnosed with atrial fibrillation on 11/2017 after presenting with symptoms of palpitations and chest pain to ER and found to be in afib with RVR. She was discharged with Eliquis and rate control with plan to cardiovert on f/u but she had converted on her own. She denies snoring or significant alcohol use. Patient was seen at the ER 11/11/18 with symptoms of SOB, chest tightness, and palpitations for 3 days. She was found to be in afib with RVR and underwent successful DCCV. Zio patch prior to this episode showed no afib but did show very frequent short episodes of SVT. She was started on amiodarone which controlled her afib but unfortunately she developed hypothyroidism and this was discontinued.   On follow up today, patient reports that she got a COVID-19 booster on 07/05/20 and ever since then she has felt poorly with more SOB and lower extremity swelling. ILR shows persistent afib starting 5/16 and lasting for about one week. She is back in SR today.   Today, she denies symptoms of palpitations, chest pain, orthopnea, PND, dizziness, presyncope, syncope, snoring, daytime somnolence, bleeding, or neurologic sequela. The patient is tolerating medications without difficulties and is otherwise without complaint today.    Atrial Fibrillation Risk Factors:  she does not have symptoms or diagnosis of sleep apnea. she does not have a history of rheumatic fever. she does not have a history of alcohol use. The patient does not have a history of early familial atrial fibrillation or other arrhythmias.  she has a BMI of Body mass index is 29.85  kg/m.Marland Kitchen Filed Weights   07/15/20 1436  Weight: 81.4 kg    Family History  Problem Relation Age of Onset  . Other Mother        "natural causes"  . Heart disease Mother   . Cancer Father        unsure of type     Atrial Fibrillation Management history:  Previous antiarrhythmic drugs: amiodarone  Previous cardioversions: 11/11/18 Previous ablations: none CHADS2VASC score: 6 Anticoagulation history: Eliquis   Past Medical History:  Diagnosis Date  . Anxiety   . Arthritis   . Bulging lumbar disc   . Chronic lower back pain   . Coronary atherosclerosis of native coronary artery    DES LAD 02/2008, DES LAD 02/2015, DES ramus and DES x2 mid RCA 02/2020  . Depression   . Dyslipidemia   . Essential hypertension   . Facial numbness   . Frequent headaches   . GERD (gastroesophageal reflux disease)   . Glaucoma   . Heart attack ([place])   . Insomnia   . Paroxysmal atrial fibrillation [facility])    Diagnosed October 2019  . PSVT (paroxysmal supraventricular tachycardia) ([place])   . Refusal of blood transfusions as patient is Jehovah's Witness   . Thyroid disease   . Type 2 diabetes mellitus (HCC)    Borderline  . Vitamin D deficiency    Past Surgical History:  Procedure Laterality Date  . CARDIAC CATHETERIZATION N/A 02/23/2015   Procedure: Left Heart Cath and Coronary Angiography;  Surgeon: Sherren Mocha, MD;  Location: [place] [facility];  Service: Cardiovascular;  Laterality: N/A;  .  CARDIAC CATHETERIZATION N/A 02/23/2015   Procedure: Coronary Stent Intervention;  Surgeon: Sherren Mocha, MD;  Location: [place] [facility];  Service: Cardiovascular;  Laterality: N/A;  . CARDIAC CATHETERIZATION N/A 01/04/2016   Procedure: Left Heart Cath and Coronary Angiography;  Surgeon: Peter M Martinique, MD;  Location: [place] [facility];  Service: Cardiovascular;  Laterality: N/A;  . CATARACT EXTRACTION    . CORONARY ANGIOPLASTY  02/23/2015  . CORONARY ANGIOPLASTY WITH STENT PLACEMENT  2009  .  CORONARY STENT INTERVENTION N/A 03/09/2020   Procedure: CORONARY STENT INTERVENTION;  Surgeon: Jettie Booze, MD;  Location: [place] [facility];  Service: Cardiovascular;  Laterality: N/A;  . DILATION AND CURETTAGE OF UTERUS    . implantable loop recorder placement  04/01/2020   Medtronic Reveal Linq model LNQ [id number]) implantable loop recorder   . LEFT HEART CATH AND CORONARY ANGIOGRAPHY N/A 03/09/2020   Procedure: LEFT HEART CATH AND CORONARY ANGIOGRAPHY;  Surgeon: Jettie Booze, MD;  Location: [place] [facility];  Service: Cardiovascular;  Laterality: N/A;  . TUBAL LIGATION      Current Outpatient Medications  Medication Sig Dispense Refill  . amitriptyline (ELAVIL) 10 MG tablet Take 1 tablet (10 mg total) by mouth at bedtime. 60 tablet 2  . apixaban (ELIQUIS) 5 MG TABS tablet Take 1 tablet (5 mg total) by mouth 2 (two) times daily. 28 tablet 0  . Ascorbic Acid (VITAMIN C) 500 MG tablet Take 500 mg by mouth daily.      . bimatoprost (LUMIGAN) 0.01 % SOLN Place 1 drop into both eyes at bedtime.    . Calcium Carbonate Antacid (TUMS PO) Take 2 tablets by mouth 4 (four) times daily as needed (for acid reflux/indigestion).     . Cholecalciferol (VITAMIN D) 400 UNITS capsule Take 400 Units by mouth daily.      . ciclopirox (PENLAC) 8 % solution Apply topically.    . clopidogrel (PLAVIX) 75 MG tablet Take 1 tablet (75 mg total) by mouth daily with breakfast. 90 tablet 3  . Cyanocobalamin (VITAMIN B 12 PO) Take 1 tablet by mouth daily.    . diclofenac Sodium (VOLTAREN) 1 % GEL Apply 2 g topically 4 (four) times daily. 50 g 2  . diltiazem (CARDIZEM CD) 240 MG 24 hr capsule Take 1 capsule (240 mg total) by mouth daily. 90 capsule 3  . Garlic 676 MG TABS Take 1 tablet by mouth daily.    Marland Kitchen HYDROcodone-acetaminophen (NORCO) 10-325 MG tablet Take 1 tablet by mouth 4 (four) times daily as needed. For pain.    Marland Kitchen levothyroxine (SYNTHROID) 88 MCG tablet Take 1 tablet (88 mcg total) by  mouth daily before breakfast. 30 tablet 3  . losartan (COZAAR) 50 MG tablet Take 1 tablet (50 mg total) by mouth daily. 90 tablet 3  . MAGNESIUM CARBONATE PO Take 1 tablet by mouth daily. 400 mg    . metFORMIN (GLUCOPHAGE-XR) 500 MG 24 hr tablet Take 1 tablet by mouth daily.     . metoprolol tartrate (LOPRESSOR) 100 MG tablet TAKE ONE TABLET BY MOUTH TWICE DAILY 180 tablet 3  . nitroGLYCERIN (NITROSTAT) 0.4 MG SL tablet Place 0.4 mg under the tongue every 5 (five) minutes as needed for chest pain.    . promethazine (PHENERGAN) 25 MG tablet 1 tablet as needed.    . rosuvastatin (CRESTOR) 20 MG tablet Take 1 tablet (20 mg total) by mouth daily. 90 tablet 3  . timolol (TIMOPTIC) 0.5 % ophthalmic solution Place  1 drop into both eyes daily.    . traZODone (DESYREL) 100 MG tablet Take 1 tablet (100 mg total) by mouth at bedtime. 90 tablet 1  . TURMERIC PO Take 1 tablet by mouth daily.    . VENTOLIN HFA 108 (90 Base) MCG/ACT inhaler Inhale 1-2 puffs into the lungs every 4 (four) hours as needed for shortness of breath.     No current facility-administered medications for this encounter.    No Known Allergies  Social History   Socioeconomic History  . Marital status: Widowed    Spouse name: Not on file  . Number of children: 8  . Years of education: 10th grade  . Highest education level: 10th grade  Occupational History  . Occupation: RETIRED  Tobacco Use  . Smoking status: Never Smoker  . Smokeless tobacco: Never Used  Vaping Use  . Vaping Use: Never used  Substance and Sexual Activity  . Alcohol use: Yes    Alcohol/week: 1.0 standard drink    Types: 1 Glasses of wine per week    Comment: RARE OCCASION  . Drug use: No  . Sexual activity: Not Currently  Other Topics Concern  . Not on file  Social History Narrative   Lives at home alone.   Right-handed.   No caffeine use.   Social Determinants of Health   Financial Resource Strain: Low Risk   . Difficulty of Paying Living  Expenses: Not hard at all  Food Insecurity: No Food Insecurity  . Worried About Charity fundraiser in the Last Year: Never true  . Ran Out of Food in the Last Year: Never true  Transportation Needs: No Transportation Needs  . Lack of Transportation (Medical): No  . Lack of Transportation (Non-Medical): No  Physical Activity: Inactive  . Days of Exercise per Week: 0 days  . Minutes of Exercise per Session: 0 min  Stress: No Stress Concern Present  . Feeling of Stress : Not at all  Social Connections: Moderately Integrated  . Frequency of Communication with Friends and Family: More than three times a week  . Frequency of Social Gatherings with Friends and Family: More than three times a week  . Attends Religious Services: More than 4 times per year  . Active Member of Clubs or Organizations: Yes  . Attends Archivist Meetings: More than 4 times per year  . Marital Status: Widowed  Intimate Partner Violence: Not At Risk  . Fear of Current or Ex-Partner: No  . Emotionally Abused: No  . Physically Abused: No  . Sexually Abused: No     ROS- All systems are reviewed and negative except as per the HPI above.  Physical Exam: Vitals:   07/15/20 1436  BP: (!) 142/76  Pulse: 65  Weight: 81.4 kg  Height: 5\' 5"  (1.651 m)    GEN- The patient is a well appearing elderly female, alert and oriented x 3 today.   HEENT-head normocephalic, atraumatic, sclera clear, conjunctiva pink, hearing intact, trachea midline. Lungs- Clear to ausculation bilaterally, normal work of breathing Heart- Regular rate and rhythm, no murmurs, rubs or gallops  GI- soft, NT, ND, + BS Extremities- no clubbing, cyanosis. 1+ bilateral edema MS- no significant deformity or atrophy Skin- no rash or lesion Psych- euthymic mood, full affect Neuro- strength and sensation are intact   Wt Readings from Last 3 Encounters:  07/15/20 81.4 kg  07/06/20 78.5 kg  05/06/20 82.1 kg    EKG today demonstrates   SR,  PVC Vent. rate 65 BPM PR interval 186 ms QRS duration 62 ms QT/QTcB 434/451 ms  Echo 12/11/19 demonstrated  1. Left ventricular ejection fraction, by estimation, is 65 to 70%. The  left ventricle has normal function. The left ventricle has no regional  wall motion abnormalities. There is mild left ventricular hypertrophy.  Left ventricular diastolic parameters  are consistent with Grade II diastolic dysfunction (pseudonormalization).  Elevated left atrial pressure.  2. Right ventricular systolic function is normal. The right ventricular  size is normal. There is mildly elevated pulmonary artery systolic  pressure.  3. The mitral valve is normal in structure. Mild mitral valve  regurgitation. No evidence of mitral stenosis. Moderate mitral annular  calcification.  4. The aortic valve has an indeterminant number of cusps. There is mild  calcification of the aortic valve. There is mild thickening of the aortic  valve. Aortic valve regurgitation is mild. No aortic stenosis is present.  5. Mild pulmonary HTN, PASP is 36 mmHg.  6. The inferior vena cava is normal in size with greater than 50%  respiratory variability, suggesting right atrial pressure of 3 mmHg.    Epic records are reviewed at length today   Assessment and Plan: 1. Persistent atrial fibrillation/SVT Recall amiodarone d/c 2/2 elevated TSH (stopped 11/11/19) Would avoid class IC with h/o CAD. ILR shows 15.1% burden with an episode starting 07/05/20. Back in SR now. Unclear if related to COVID booster. Would recommend reprogramming device to alert sooner for persistent afib. Continue Eliquis 5 mg BID Continue diltiazem 240 mg daily Continue Lopressor 100 mg BID  This patients CHA2DS2-VASc Score and unadjusted Ischemic Stroke Rate (% per year) is equal to 9.7 % stroke rate/year from a score of 6  Above score calculated as 1 point each if present [CHF, HTN, DM, Vascular=MI/PAD/Aortic Plaque, Age if 65-74, or  Female] Above score calculated as 2 points each if present [Age > 75, or Stroke/TIA/TE]  2. CAD No anginal symptoms.  3. HTN Stable, no changes today.  4. Dyspnea She has been more short of breath with more lower extremity edema and a ~6 lbs weight gain. Suspect 2/2 being out of rhythm.  Start Lasix 20 mg daily x 3 days. Also start KCl 10 meq with Lasix.    Follow up with Dr Rayann Heman as scheduled.    [facility] [address] [place], [place] [address] [phone] 07/15/2020 2:45 PM

## 2020-07-30 ENCOUNTER — Other Ambulatory Visit: Payer: Self-pay

## 2020-07-30 ENCOUNTER — Encounter: Payer: Self-pay | Admitting: Internal Medicine

## 2020-07-30 ENCOUNTER — Ambulatory Visit: Payer: Medicare Other | Admitting: Internal Medicine

## 2020-07-30 VITALS — BP 164/70 | HR 63 | Ht 65.0 in | Wt 170.6 lb

## 2020-07-30 DIAGNOSIS — I4819 Other persistent atrial fibrillation: Secondary | ICD-10-CM

## 2020-07-30 DIAGNOSIS — I10 Essential (primary) hypertension: Secondary | ICD-10-CM | POA: Diagnosis not present

## 2020-07-30 DIAGNOSIS — I25119 Atherosclerotic heart disease of native coronary artery with unspecified angina pectoris: Secondary | ICD-10-CM | POA: Diagnosis not present

## 2020-07-30 DIAGNOSIS — D6869 Other thrombophilia: Secondary | ICD-10-CM

## 2020-07-30 NOTE — Patient Instructions (Signed)
Medication Instructions:  Continue all current medications.   Labwork: none  Testing/Procedures: none  Follow-Up: 6 months   Any Other Special Instructions Will Be Listed Below (If Applicable).   If you need a refill on your cardiac medications before your next appointment, please call your pharmacy.  

## 2020-07-30 NOTE — Progress Notes (Signed)
PCP: Ivy Lynn, NP Primary Cardiologist: Dr Domenic Polite Primary EP: Dr Melina Schools ISMA TIETJE is a 80 y.o. female who presents today for routine electrophysiology followup.  Since last being seen in our clinic, the patient reports doing reasonably  well.  She did have afib in May a couple weeks after her COVID  booster.  + SOB and fatigue.  She converted to sinus after about 5 days.  She has subsequently improved with diuresis. Today, she denies symptoms of palpitations, chest pain,  lower extremity edema, dizziness, presyncope, or syncope.  The patient is otherwise without complaint today.   Past Medical History:  Diagnosis Date   Anxiety    Arthritis    Bulging lumbar disc    Chronic lower back pain    Coronary atherosclerosis of native coronary artery    DES LAD 02/2008, DES LAD 02/2015, DES ramus and DES x2 mid RCA 02/2020   Depression    Dyslipidemia    Essential hypertension    Facial numbness    Frequent headaches    GERD (gastroesophageal reflux disease)    Glaucoma    Heart attack ([place])    Insomnia    Paroxysmal atrial fibrillation [facility])    Diagnosed October 2019   PSVT (paroxysmal supraventricular tachycardia) ([place])    Refusal of blood transfusions as patient is Jehovah's Witness    Thyroid disease    Type 2 diabetes mellitus ([place])    Borderline   Vitamin D deficiency    Past Surgical History:  Procedure Laterality Date   CARDIAC CATHETERIZATION N/A 02/23/2015   Procedure: Left Heart Cath and Coronary Angiography;  Surgeon: Sherren Mocha, MD;  Location: [place] [facility];  Service: Cardiovascular;  Laterality: N/A;   CARDIAC CATHETERIZATION N/A 02/23/2015   Procedure: Coronary Stent Intervention;  Surgeon: Sherren Mocha, MD;  Location: [place] [facility];  Service: Cardiovascular;  Laterality: N/A;   CARDIAC CATHETERIZATION N/A 01/04/2016   Procedure: Left Heart Cath and Coronary Angiography;  Surgeon: Peter M Martinique, MD;  Location: [place] [facility];  Service:  Cardiovascular;  Laterality: N/A;   CATARACT EXTRACTION     CORONARY ANGIOPLASTY  02/23/2015   CORONARY ANGIOPLASTY WITH STENT PLACEMENT  2009   CORONARY STENT INTERVENTION N/A 03/09/2020   Procedure: CORONARY STENT INTERVENTION;  Surgeon: Jettie Booze, MD;  Location: [place] [facility];  Service: Cardiovascular;  Laterality: N/A;   DILATION AND CURETTAGE OF UTERUS     implantable loop recorder placement  04/01/2020   Medtronic Reveal Linq model LNQ 22 [id number]) implantable loop recorder    LEFT HEART CATH AND CORONARY ANGIOGRAPHY N/A 03/09/2020   Procedure: LEFT HEART CATH AND CORONARY ANGIOGRAPHY;  Surgeon: Jettie Booze, MD;  Location: [place] [facility];  Service: Cardiovascular;  Laterality: N/A;   TUBAL LIGATION      ROS- all systems are reviewed and negatives except as per HPI above  Current Outpatient Medications  Medication Sig Dispense Refill   amitriptyline (ELAVIL) 10 MG tablet Take 1 tablet (10 mg total) by mouth at bedtime. 60 tablet 2   apixaban (ELIQUIS) 5 MG TABS tablet Take 1 tablet (5 mg total) by mouth 2 (two) times daily. 28 tablet 0   Ascorbic Acid (VITAMIN C) 500 MG tablet Take 500 mg by mouth daily.       bimatoprost (LUMIGAN) 0.01 % SOLN Place 1 drop into both eyes at bedtime.     Calcium Carbonate Antacid (TUMS PO) Take 2 tablets by  mouth 4 (four) times daily as needed (for acid reflux/indigestion).      Cholecalciferol (VITAMIN D) 400 UNITS capsule Take 400 Units by mouth daily.       ciclopirox (PENLAC) 8 % solution Apply topically.     clopidogrel (PLAVIX) 75 MG tablet Take 1 tablet (75 mg total) by mouth daily with breakfast. 90 tablet 3   Cyanocobalamin (VITAMIN B 12 PO) Take 1 tablet by mouth daily.     diclofenac Sodium (VOLTAREN) 1 % GEL Apply 2 g topically 4 (four) times daily. 50 g 2   diltiazem (CARDIZEM CD) 240 MG 24 hr capsule Take 1 capsule (240 mg total) by mouth daily. 90 capsule 3   furosemide (LASIX) 20 MG tablet Take 1 tablet (20  mg total) by mouth daily as needed for fluid. 30 tablet 1   Garlic 409 MG TABS Take 1 tablet by mouth daily.     HYDROcodone-acetaminophen (NORCO) 10-325 MG tablet Take 1 tablet by mouth 4 (four) times daily as needed. For pain.     levothyroxine (SYNTHROID) 88 MCG tablet Take 1 tablet (88 mcg total) by mouth daily before breakfast. 30 tablet 3   losartan (COZAAR) 50 MG tablet Take 1 tablet (50 mg total) by mouth daily. 90 tablet 3   MAGNESIUM CARBONATE PO Take 1 tablet by mouth daily. 400 mg     metFORMIN (GLUCOPHAGE-XR) 500 MG 24 hr tablet Take 1 tablet by mouth daily.      metoprolol tartrate (LOPRESSOR) 100 MG tablet TAKE ONE TABLET BY MOUTH TWICE DAILY 180 tablet 3   nitroGLYCERIN (NITROSTAT) 0.4 MG SL tablet Place 0.4 mg under the tongue every 5 (five) minutes as needed for chest pain.     potassium chloride (KLOR-CON) 10 MEQ tablet Take 1 tablet (10 mEq total) by mouth daily as needed (on days lasix is taken). 30 tablet 1   promethazine (PHENERGAN) 25 MG tablet 1 tablet as needed.     rosuvastatin (CRESTOR) 20 MG tablet Take 1 tablet (20 mg total) by mouth daily. 90 tablet 3   timolol (TIMOPTIC) 0.5 % ophthalmic solution Place 1 drop into both eyes daily.     traZODone (DESYREL) 100 MG tablet Take 1 tablet (100 mg total) by mouth at bedtime. 90 tablet 1   TURMERIC PO Take 1 tablet by mouth daily.     VENTOLIN HFA 108 (90 Base) MCG/ACT inhaler Inhale 1-2 puffs into the lungs every 4 (four) hours as needed for shortness of breath.     No current facility-administered medications for this visit.    Physical Exam: Vitals:   07/30/20 1430  BP: (!) 164/70  Pulse: 63  SpO2: 95%  Weight: 170 lb 9.6 oz (77.4 kg)  Height: 5\' 5"  (1.651 m)    GEN- The patient is well appearing, alert and oriented x 3 today.   Head- normocephalic, atraumatic Eyes-  Sclera clear, conjunctiva pink Ears- hearing intact Oropharynx- clear Lungs- Clear to ausculation bilaterally, normal work of  breathing Heart- Regular rate and rhythm, no murmurs, rubs or gallops, PMI not laterally displaced GI- soft, NT, ND, + BS Extremities- no clubbing, cyanosis, or edema  Wt Readings from Last 3 Encounters:  07/30/20 170 lb 9.6 oz (77.4 kg)  07/15/20 179 lb 6.4 oz (81.4 kg)  07/06/20 173 lb (78.5 kg)     Assessment and Plan:  Persistent atrial fibrillation Afib burden is low overall, though she did have persistent afib in May for which she was seen in the  AF clinic  07/15/20 (note reviewed).  This episode occurred soon after her COVID booster. Continue eliquis (chads2vasc score is 6) and current medicine We discussed options of watchful waiting, tikosyn, or ablation.  At this time, she would prefer watchful waiting. We will reconsider options if her AF burden increases.  2. HTN Stable No change required today  3. CAD S/p PCI for NSTEMI previously  No ischemic symptoms  4. Chonic diastolic dysfunction NYHA Class II Lasix startred by AF clinic  Risks, benefits and potential toxicities for medications prescribed and/or refilled reviewed with patient today.   Thompson Grayer MD, [facility] 07/30/2020 2:54 PM

## 2020-08-02 NOTE — Progress Notes (Signed)
Carelink Summary Report / Loop Recorder 

## 2020-08-05 ENCOUNTER — Other Ambulatory Visit: Payer: Self-pay | Admitting: Nurse Practitioner

## 2020-08-05 DIAGNOSIS — E032 Hypothyroidism due to medicaments and other exogenous substances: Secondary | ICD-10-CM

## 2020-08-06 ENCOUNTER — Other Ambulatory Visit (HOSPITAL_COMMUNITY)
Admission: RE | Admit: 2020-08-06 | Discharge: 2020-08-06 | Disposition: A | Discharge status: Home / Self Care | Payer: Medicare Other | Source: Ambulatory Visit | Attending: Nurse Practitioner | Admitting: Nurse Practitioner

## 2020-08-06 ENCOUNTER — Other Ambulatory Visit: Payer: Self-pay

## 2020-08-06 DIAGNOSIS — E032 Hypothyroidism due to medicaments and other exogenous substances: Secondary | ICD-10-CM | POA: Insufficient documentation

## 2020-08-06 LAB — T4, FREE: Free T4: 1.26 ng/dL — ABNORMAL HIGH (ref 0.61–1.12)

## 2020-08-06 LAB — TSH: TSH: 0.745 u[IU]/mL (ref 0.350–4.500)

## 2020-08-12 ENCOUNTER — Ambulatory Visit (INDEPENDENT_AMBULATORY_CARE_PROVIDER_SITE_OTHER): Payer: Medicare Other | Admitting: Nurse Practitioner

## 2020-08-12 ENCOUNTER — Other Ambulatory Visit: Payer: Self-pay

## 2020-08-12 ENCOUNTER — Encounter: Payer: Self-pay | Admitting: Nurse Practitioner

## 2020-08-12 VITALS — BP 138/78 | HR 78 | Ht 65.0 in | Wt 169.0 lb

## 2020-08-12 DIAGNOSIS — E032 Hypothyroidism due to medicaments and other exogenous substances: Secondary | ICD-10-CM | POA: Diagnosis not present

## 2020-08-12 DIAGNOSIS — E042 Nontoxic multinodular goiter: Secondary | ICD-10-CM

## 2020-08-12 MED ORDER — LEVOTHYROXINE SODIUM 75 MCG PO TABS
75.0000 ug | ORAL_TABLET | Freq: Every day | ORAL | 3 refills | Status: DC
Start: 2020-08-12 — End: 2021-01-12

## 2020-08-12 NOTE — Progress Notes (Signed)
08/12/2020, 10:50 AM                          Endocrinology Follow Up Visit   Subjective:    Patient ID: [name], female    DOB: [birth year], PCP Ivy Lynn, NP   Past Medical History:  Diagnosis Date   Anxiety    Arthritis    Bulging lumbar disc    Chronic lower back pain    Coronary atherosclerosis of native coronary artery    DES LAD 02/2008, DES LAD 02/2015, DES ramus and DES x2 mid RCA 02/2020   Depression    Dyslipidemia    Essential hypertension    Facial numbness    Frequent headaches    GERD (gastroesophageal reflux disease)    Glaucoma    Heart attack ([place])    Insomnia    Paroxysmal atrial fibrillation [facility])    Diagnosed October 2019   PSVT (paroxysmal supraventricular tachycardia) ([place])    Refusal of blood transfusions as patient is Jehovah's Witness    Thyroid disease    Type 2 diabetes mellitus ([place])    Borderline   Vitamin D deficiency    Past Surgical History:  Procedure Laterality Date   CARDIAC CATHETERIZATION N/A 02/23/2015   Procedure: Left Heart Cath and Coronary Angiography;  Surgeon: Sherren Mocha, MD;  Location: [place] [facility];  Service: Cardiovascular;  Laterality: N/A;   CARDIAC CATHETERIZATION N/A 02/23/2015   Procedure: Coronary Stent Intervention;  Surgeon: Sherren Mocha, MD;  Location: [place] [facility];  Service: Cardiovascular;  Laterality: N/A;   CARDIAC CATHETERIZATION N/A 01/04/2016   Procedure: Left Heart Cath and Coronary Angiography;  Surgeon: Peter M Martinique, MD;  Location: [place] [facility];  Service: Cardiovascular;  Laterality: N/A;   CATARACT EXTRACTION     CORONARY ANGIOPLASTY  02/23/2015   CORONARY ANGIOPLASTY WITH STENT PLACEMENT  2009   CORONARY STENT INTERVENTION N/A 03/09/2020   Procedure: CORONARY STENT INTERVENTION;  Surgeon: Jettie Booze, MD;  Location: [place] [facility];  Service: Cardiovascular;  Laterality: N/A;   DILATION AND CURETTAGE OF UTERUS      implantable loop recorder placement  04/01/2020   Medtronic Reveal Linq model LNQ 22 [id number]) implantable loop recorder    LEFT HEART CATH AND CORONARY ANGIOGRAPHY N/A 03/09/2020   Procedure: LEFT HEART CATH AND CORONARY ANGIOGRAPHY;  Surgeon: Jettie Booze, MD;  Location: [place] [facility];  Service: Cardiovascular;  Laterality: N/A;   TUBAL LIGATION     Social History   Socioeconomic History   Marital status: Widowed    Spouse name: Not on file   Number of children: 8   Years of education: 10th grade   Highest education level: 10th grade  Occupational History   Occupation: RETIRED  Tobacco Use   Smoking status: Never   Smokeless tobacco: Never  Vaping Use   Vaping Use: Never used  Substance and Sexual Activity   Alcohol use: Yes    Alcohol/week: 1.0 standard drink    Types: 1 Glasses of wine per week    Comment: RARE OCCASION   Drug use: No   Sexual activity: Not Currently  Other Topics Concern  Not on file  Social History Narrative   Lives at home alone.   Right-handed.   No caffeine use.   Social Determinants of Health   Financial Resource Strain: Low Risk    Difficulty of Paying Living Expenses: Not hard at all  Food Insecurity: No Food Insecurity   Worried About Charity fundraiser in the Last Year: Never true   [place] in the Last Year: Never true  Transportation Needs: No Transportation Needs   Lack of Transportation (Medical): No   Lack of Transportation (Non-Medical): No  Physical Activity: Inactive   Days of Exercise per Week: 0 days   Minutes of Exercise per Session: 0 min  Stress: No Stress Concern Present   Feeling of Stress : Not at all  Social Connections: Moderately Integrated   Frequency of Communication with Friends and Family: More than three times a week   Frequency of Social Gatherings with Friends and Family: More than three times a week   Attends Religious Services: More than 4 times per year   Active Member of  [employer] or Organizations: Yes   Attends Archivist Meetings: More than 4 times per year   Marital Status: Widowed   Family History  Problem Relation Age of Onset   Other Mother        "natural causes"   Heart disease Mother    Cancer Father        unsure of type   Outpatient Encounter Medications as of 08/12/2020  Medication Sig   amitriptyline (ELAVIL) 10 MG tablet Take 1 tablet (10 mg total) by mouth at bedtime.   apixaban (ELIQUIS) 5 MG TABS tablet Take 1 tablet (5 mg total) by mouth 2 (two) times daily.   Ascorbic Acid (VITAMIN C) 500 MG tablet Take 500 mg by mouth daily.     bimatoprost (LUMIGAN) 0.01 % SOLN Place 1 drop into both eyes at bedtime.   Calcium Carbonate Antacid (TUMS PO) Take 2 tablets by mouth 4 (four) times daily as needed (for acid reflux/indigestion).    Cholecalciferol (VITAMIN D) 400 UNITS capsule Take 400 Units by mouth daily.     ciclopirox (PENLAC) 8 % solution Apply topically.   clopidogrel (PLAVIX) 75 MG tablet Take 1 tablet (75 mg total) by mouth daily with breakfast.   Cyanocobalamin (VITAMIN B 12 PO) Take 1 tablet by mouth daily.   diclofenac Sodium (VOLTAREN) 1 % GEL Apply 2 g topically 4 (four) times daily.   diltiazem (CARDIZEM CD) 240 MG 24 hr capsule Take 1 capsule (240 mg total) by mouth daily.   furosemide (LASIX) 20 MG tablet Take 1 tablet (20 mg total) by mouth daily as needed for fluid.   Garlic 263 MG TABS Take 1 tablet by mouth daily.   HYDROcodone-acetaminophen (NORCO) 10-325 MG tablet Take 1 tablet by mouth 4 (four) times daily as needed. For pain.   levothyroxine (SYNTHROID) 75 MCG tablet Take 1 tablet (75 mcg total) by mouth daily before breakfast.   losartan (COZAAR) 50 MG tablet Take 1 tablet (50 mg total) by mouth daily.   MAGNESIUM CARBONATE PO Take 1 tablet by mouth daily. 400 mg   metFORMIN (GLUCOPHAGE-XR) 500 MG 24 hr tablet Take 1 tablet by mouth daily.    metoprolol tartrate (LOPRESSOR) 100 MG tablet TAKE ONE TABLET BY  MOUTH TWICE DAILY   nitroGLYCERIN (NITROSTAT) 0.4 MG SL tablet Place 0.4 mg under the tongue every 5 (five) minutes as needed for chest pain.  potassium chloride (KLOR-CON) 10 MEQ tablet Take 1 tablet (10 mEq total) by mouth daily as needed (on days lasix is taken).   promethazine (PHENERGAN) 25 MG tablet 1 tablet as needed.   rosuvastatin (CRESTOR) 20 MG tablet Take 1 tablet (20 mg total) by mouth daily.   timolol (TIMOPTIC) 0.5 % ophthalmic solution Place 1 drop into both eyes daily.   traZODone (DESYREL) 100 MG tablet Take 1 tablet (100 mg total) by mouth at bedtime.   TURMERIC PO Take 1 tablet by mouth daily.   VENTOLIN HFA 108 (90 Base) MCG/ACT inhaler Inhale 1-2 puffs into the lungs every 4 (four) hours as needed for shortness of breath.   [DISCONTINUED] levothyroxine (SYNTHROID) 88 MCG tablet TAKE 1 TABLET BY MOUTH EVERY MORNING BEFORE BREAKFAST   No facility-administered encounter medications on file as of 08/12/2020.   ALLERGIES: No Known Allergies  VACCINATION STATUS: Immunization History  Administered Date(s) Administered   Fluad Quad(high Dose 65+) 11/20/2019   Influenza, High Dose Seasonal PF 12/25/2016, 01/07/2018   Influenza-Unspecified 01/21/2015   Moderna SARS-COV2 Booster Vaccination 12/31/2019   Moderna Sars-Covid-2 Vaccination 04/01/2019, 04/29/2019   Pneumococcal Polysaccharide-23 03/11/2020   Tdap 12/24/2018    Thyroid Problem Presents for follow-up visit. Symptoms include constipation, fatigue and weight gain. Patient reports no anxiety, cold intolerance, depressed mood, heat intolerance, palpitations or tremors. The symptoms have been stable.   [name] is 80 y.o. female who is being engaged in follow-up after she was seen in consultation for hypothyroidism.    PCP:  Ivy Lynn, NP.   Patient is assisted by her daughter during visit.  She was sent for a more complete thyroid function test after her recent visit.  Her labs confirm primary  hypothyroidism. Of note, she was treated with amiodarone for almost a year due to cardiac dysrhythmia .  Patient denies any family history of thyroid dysfunction, except for her sister who had nodules on her thyroid requiring biopsy which were benign.  She also had suspicious nodules on her thyroid requiring biopsy.  All came back benign, even the one that needed to be sent to [facility] for confirmation.  She reports weight gain, fatigue, cold intolerance, constipation.   Review of systems  Constitutional: + steadily losing weight,  current Body mass index is 28.12 kg/m. , + fatigue, no subjective hyperthermia, no subjective hypothermia Eyes: no blurry vision, no xerophthalmia ENT: no sore throat, no nodules palpated in throat, no dysphagia/odynophagia, no hoarseness, + soreness, raw sensation to right tongue, cheek, and lip Cardiovascular: no chest pain, no shortness of breath, no palpitations, no leg swelling Respiratory: no cough, no shortness of breath Gastrointestinal: no nausea/vomiting/diarrhea, + constipation Musculoskeletal: no muscle/joint aches Skin: no rashes, no hyperemia Neurological: no tremors, no numbness, no tingling, no dizziness Psychiatric: no depression, no anxiety  Objective:    BP 138/78   Pulse 78   Ht 5\' 5"  (1.651 m)   Wt 169 lb (76.7 kg)   LMP  (LMP Unknown)   BMI 28.12 kg/m   Wt Readings from Last 3 Encounters:  08/12/20 169 lb (76.7 kg)  07/30/20 170 lb 9.6 oz (77.4 kg)  07/15/20 179 lb 6.4 oz (81.4 kg)    BP Readings from Last 3 Encounters:  08/12/20 138/78  07/30/20 (!) 164/70  07/15/20 (!) 142/76    Physical Exam- Limited  Constitutional:  Body mass index is 28.12 kg/m., not in acute distress, normal state of mind Eyes:  EOMI, no exophthalmos Mouth: No thrush, no  lesions, normal mucus production Neck: Supple Thyroid: + mild goiter Cardiovascular: RRR, no murmurs, rubs, or gallops, no edema Respiratory: Adequate breathing efforts, no  crackles, rales, rhonchi, or wheezing Musculoskeletal: no gross deformities, strength intact in all four extremities, no gross restriction of joint movements Skin:  no rashes, no hyperemia Neurological: no tremor with outstretched hands    CMP ( most recent) CMP     Component Value Date/Time   NA 138 03/11/2020 0325   NA 136 (A) 08/19/2019 0000   K 3.7 03/11/2020 0325   CL 105 03/11/2020 0325   CO2 23 03/11/2020 0325   GLUCOSE 147 (H) 03/11/2020 0325   BUN 9 03/11/2020 0325   BUN 12 08/19/2019 0000   CREATININE 0.86 03/11/2020 0325   CALCIUM 8.8 (L) 03/11/2020 0325   PROT 7.5 03/08/2020 1112   PROT 6.7 04/02/2018 1118   ALBUMIN 4.3 03/08/2020 1112   ALBUMIN 4.3 04/02/2018 1118   AST 20 03/08/2020 1112   ALT 16 03/08/2020 1112   ALKPHOS 67 03/08/2020 1112   BILITOT 0.4 03/08/2020 1112   BILITOT 0.3 04/02/2018 1118   GFRNONAA >60 03/11/2020 0325   GFRAA >60 10/21/2019 1347     Diabetic Labs (most recent): Lab Results  Component Value Date   HGBA1C 6.5 (H) 03/10/2020   HGBA1C 5.7 08/19/2019   HGBA1C 7.5 (H) 12/17/2017     Lipid Panel ( most recent) Lipid Panel     Component Value Date/Time   CHOL 124 03/09/2020 0209   TRIG 58 03/09/2020 0209   HDL 51 03/09/2020 0209   CHOLHDL 2.4 03/09/2020 0209   VLDL 12 03/09/2020 0209   LDLCALC 61 03/09/2020 0209      Lab Results  Component Value Date   TSH 0.745 08/06/2020   TSH 6.335 (H) 05/05/2020   TSH 10.867 (H) 03/03/2020   TSH 20.523 (H) 02/03/2020   TSH 16.400 (H) 01/30/2020   TSH 11.769 (H) 11/11/2019   TSH 5.290 (H) 08/12/2019   TSH 1.134 11/13/2018   TSH 1.422 12/17/2017   TSH 0.564 12/01/2016   FREET4 1.26 (H) 08/06/2020   FREET4 0.95 05/05/2020   FREET4 0.65 03/03/2020   FREET4 0.44 (L) 02/03/2020     Thyroid US on 04/29/20 CLINICAL DATA:  Hyperthyroidism   EXAM: THYROID ULTRASOUND   TECHNIQUE: Ultrasound examination of the thyroid gland and adjacent soft tissues was performed.    COMPARISON:  None.   FINDINGS: Parenchymal Echotexture: Mildly heterogeneous   Isthmus: 0.7 cm   Right lobe: 6.2 x 2.6 x 2.2 cm   Left lobe: 5.0 x 2.1 x 2.2 cm   _________________________________________________________   Estimated total number of nodules >/= 1 cm: 7   Number of spongiform nodules >/=  2 cm not described below (TR1): 0   Number of mixed cystic and solid nodules >/= 1.5 cm not described below (TR2): 0   _________________________________________________________   Nodule # 1:   Location: Right; superior   Maximum size: 1.3 cm; Other 2 dimensions: 1.3 x 1.0 cm   Composition: solid/almost completely solid (2)   Echogenicity: hypoechoic (2)   Shape: taller-than-wide (3)   Margins: smooth (0)   Echogenic foci: none (0)   ACR TI-RADS total points: 7.   ACR TI-RADS risk category: TR5 (>/= 7 points).   ACR TI-RADS recommendations:   **Given size (>/= 1.0 cm) and appearance, fine needle aspiration of this highly suspicious nodule should be considered based on TI-RADS criteria.   _________________________________________________________   Nodule # 2:  Location: Right; superior   Maximum size: 2.2 cm; Other 2 dimensions: 1.7 x 1.7 cm   Composition: solid/almost completely solid (2)   Echogenicity: isoechoic (1)   Shape: taller-than-wide (3)   Margins: smooth (0)   Echogenic foci: none (0)   ACR TI-RADS total points: 4.   ACR TI-RADS risk category: TR4 (4-6 points).   ACR TI-RADS recommendations:   **Given size (>/= 1.5 cm) and appearance, fine needle aspiration of this moderately suspicious nodule should be considered based on TI-RADS criteria.   _________________________________________________________   Nodule # 3:   Location: Right; Mid   Maximum size: 2.3 cm; Other 2 dimensions: 1.7 x 2.2 cm   Composition: solid/almost completely solid (2)   Echogenicity: hypoechoic (2)   Shape: not taller-than-wide (0)   Margins:  smooth (0)   Echogenic foci: none (0)   ACR TI-RADS total points: 4.   ACR TI-RADS risk category: TR4 (4-6 points).   ACR TI-RADS recommendations:   **Given size (>/= 1.5 cm) and appearance, fine needle aspiration of this moderately suspicious nodule should be considered based on TI-RADS criteria.   _________________________________________________________   Nodule # 4:   Location: Right; Inferior   Maximum size: 1.6 cm; Other 2 dimensions: 1.0 x 1.2 cm   Composition: mixed cystic and solid (1)   Echogenicity: isoechoic (1)   Shape: not taller-than-wide (0)   Margins: smooth (0)   Echogenic foci: none (0)   ACR TI-RADS total points: 2.   ACR TI-RADS risk category: TR2 (2 points).   ACR TI-RADS recommendations:   This nodule does NOT meet TI-RADS criteria for biopsy or dedicated follow-up.   _________________________________________________________   Nodule # 5:   Location: Left; superior   Maximum size: 1.8 cm; Other 2 dimensions: 1.3 x 1.8 cm   Composition: solid/almost completely solid (2)   Echogenicity: isoechoic (1)   Shape: not taller-than-wide (0)   Margins: smooth (0)   Echogenic foci: none (0)   ACR TI-RADS total points: 3.   ACR TI-RADS risk category: TR3 (3 points).   ACR TI-RADS recommendations:   *Given size (>/= 1.5 - 2.4 cm) and appearance, a follow-up ultrasound in 1 year should be considered based on TI-RADS criteria.   _________________________________________________________   Nodule # 6:   Location: Left; mid   Maximum size: 1.9 cm; Other 2 dimensions: 1.7 x 1.8 cm   Composition: solid/almost completely solid (2)   Echogenicity: isoechoic (1)   Shape: taller-than-wide (3)   Margins: ill-defined (0)   Echogenic foci: none (0)   ACR TI-RADS total points: 6.   ACR TI-RADS risk category: TR4 (4-6 points).   ACR TI-RADS recommendations:   **Given size (>/= 1.5 cm) and appearance, fine needle aspiration of this  moderately suspicious nodule should be considered based on TI-RADS criteria.   _________________________________________________________   Nodule # 7: 1.1 cm cystic nodule in the inferior left thyroid lobe does not meet criteria for FNA or imaging follow-up.   IMPRESSION: 1. Nodule 1 (TI-RADS 5) located in the superior right thyroid lobe, measuring 1.3 x 1.3 x 1.0 cm, meets criteria for FNA. 2. Nodule 2 (TI-RADS 4), located in the superior right thyroid lobe, measuring 2.2 x 1.7 x 1.7 cm, meets criteria for FNA. 3. Nodule 3 (TI-RADS 4) located in the mid right thyroid lobe, measuring 2.3 x 1.7 x 2.2 cm, meets criteria for FNA. 4. Nodule 5 (TI-RADS 3) located in the superior left thyroid lobe, measuring 1.8 x 1.3 x 1.8 cm, meets  criteria for imaging follow-up. Next ultrasound should be performed in 1 year. 5. Nodule 6 (TI-RADS 4), located in the mid left thyroid lobe, measuring 1.9 x 1.7 x 1.8 cm, meets criteria for FNA. 6. Order of suspicion of nodules for consideration for FNA: 1, 3, 2, 6   The above is in keeping with the ACR TI-RADS recommendations - J Am Coll Radiol 2017;14:587-595.     Electronically Signed   By: Miachel Roux M.D.   On: 04/29/2020 16:12 -------------------------------------------------------------------------------------------------------------------------- Cytology - Non PAP  CASE: [id number]  PATIENT: [name]  Non-Gynecological Cytology Report    FNA Thyroid Nodule Biopsy  Clinical History: 2.3 cm RML  Specimen Submitted:  A. THYROID, RML, FINE NEEDLE ASPIRATION:    FINAL MICROSCOPIC DIAGNOSIS:  - Atypia of undetermined significance (Bethesda category III)   SPECIMEN ADEQUACY:  Satisfactory for evaluation   DIAGNOSTIC COMMENTS:  This specimen will be sent for Afirma testing.   GROSS:  Received is/are 3 slides in 95% Ethyl alcohol, 3 air dried slides for  diff stain, and 30 ccs of pale pink Cytolyt solution. (CM:cm)  Prepared:   Smears:  6  Concentration Method (Thin Prep):  1  Cell Block:  Cell block attempted, not obtained.  Additional Studies:  Also there was an Afirma collected. For RUL Thyroid  FNA, see [id number].   CYTOLOGY - NON PAP  CASE: [id number]  PATIENT: [name]  Non-Gynecological Cytology Report          FNA Thyroid Nodule Biopsy  Clinical History: 1.3 cm RUL  Specimen Submitted:  A. THYROID, RUL, FINE NEEDLE ASPIRATION:    FINAL MICROSCOPIC DIAGNOSIS:  - Consistent with benign follicular nodule (Bethesda category II)   SPECIMEN ADEQUACY:  Satisfactory for evaluation   GROSS:  Received is/are 4 slides in 95% Ethyl alcohol, 4 air dried slides for  diff stain, and 30 ccs of pale pink Cytolyt solution. (CM:cm)  Prepared:  Smears:  8  Concentration Method (Thin Prep):  1  Cell Block:  Cell block attempted, not obtained.  Additional Studies:  Also there was an Afirma collected. For RML Thyroid  FNA, see [id number].   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  She is advised to lower her dose of Levothyroxine to 75 mcg po daily before breakfast (it is possible that she will fluctuate between 2 different doses based on the season).    - We discussed about the correct intake of her thyroid hormone, on empty stomach at fasting, with water, separated by at least 30 minutes from breakfast and other medications,  and separated by more than 4 hours from calcium, iron, multivitamins, acid reflux medications (PPIs). -Patient is made aware of the fact that thyroid hormone replacement is needed for life, dose to be adjusted by periodic monitoring of thyroid function tests.   2. Multinodular Goiter -Her thyroid ultrasound shows multinodular goiter with 4 moderately suspicious nodules that meet criteria for FNA and 1 nodule recommending follow up with ultrasound in 1 year.   All  biopsies were confirmed to be benign.  Will follow up with another thyroid ultrasound in 1 year for surveillance.      - she is advised to maintain close follow up with Ivy Lynn, NP for primary care needs.  She asked me if her Metformin could be causing the metallic taste in her mouth, in which it is a possibility, but it is affecting her eating  pattern.  She asked if she could stop taking it for about a week to see if her symptoms improved.  I told her from an endocrinology stand point that should be ok, especially since her most recent A1c was stable at 6.5% but she should still check with her PCP first.      I spent 30 minutes in the care of the patient today including review of labs from Thyroid Function, CMP, and other relevant labs ; imaging/biopsy records (current and previous including abstractions from other facilities); face-to-face time discussing  her lab results and symptoms, medications doses, her options of short and long term treatment based on the latest standards of care / guidelines;   and documenting the encounter.  [name]  participated in the discussions, expressed understanding, and voiced agreement with the above plans.  All questions were answered to her satisfaction. she is encouraged to contact clinic should she have any questions or concerns prior to her return visit.   Follow up plan: Return in about 6 months (around 02/11/2021) for Thyroid follow up, Previsit labs.   Rayetta Pigg, [facility] [address] [place], [place] [address] Phone: [phone] Fax: [phone]   08/12/2020, 10:50 AM

## 2020-08-12 NOTE — Patient Instructions (Signed)

## 2020-08-13 LAB — CUP PACEART REMOTE DEVICE CHECK
Date Time Interrogation Session: 20220623204557
Implantable Pulse Generator Implant Date: 20220210

## 2020-08-16 ENCOUNTER — Other Ambulatory Visit: Payer: Self-pay | Admitting: Internal Medicine

## 2020-08-16 ENCOUNTER — Ambulatory Visit (INDEPENDENT_AMBULATORY_CARE_PROVIDER_SITE_OTHER): Payer: Medicare Other

## 2020-08-16 DIAGNOSIS — I4819 Other persistent atrial fibrillation: Secondary | ICD-10-CM

## 2020-08-24 ENCOUNTER — Telehealth: Payer: Self-pay | Admitting: Cardiology

## 2020-08-24 NOTE — Telephone Encounter (Signed)
Patient called stating that she received telephone call from [facility] today in regards to refilling her Eliquis. She was told that she is going to have to start over with a new application to be qualified for medication.  Patient is running out of her Eliquis and is in need of samples.   [phone] .

## 2020-08-25 NOTE — Telephone Encounter (Signed)
Spoke to pt who stated that she must fill our another PAF form to qaulify for the PAF to pay for her Eliquis. She cannot afford it alone. Pt is coming by today to pick up forms and sample of Eliquis.   Lot #: [id number] EXP: 08/2022

## 2020-08-27 ENCOUNTER — Ambulatory Visit (INDEPENDENT_AMBULATORY_CARE_PROVIDER_SITE_OTHER): Payer: Medicare Other | Admitting: Nurse Practitioner

## 2020-08-27 ENCOUNTER — Encounter: Payer: Self-pay | Admitting: Nurse Practitioner

## 2020-08-27 ENCOUNTER — Other Ambulatory Visit: Payer: Self-pay

## 2020-08-27 VITALS — BP 139/55 | HR 80 | Temp 97.1°F | Ht 65.0 in | Wt 169.0 lb

## 2020-08-27 DIAGNOSIS — Z0000 Encounter for general adult medical examination without abnormal findings: Secondary | ICD-10-CM

## 2020-08-27 DIAGNOSIS — F418 Other specified anxiety disorders: Secondary | ICD-10-CM

## 2020-08-27 DIAGNOSIS — Z0001 Encounter for general adult medical examination with abnormal findings: Secondary | ICD-10-CM | POA: Diagnosis not present

## 2020-08-27 DIAGNOSIS — Z79899 Other long term (current) drug therapy: Secondary | ICD-10-CM

## 2020-08-27 DIAGNOSIS — E119 Type 2 diabetes mellitus without complications: Secondary | ICD-10-CM | POA: Diagnosis not present

## 2020-08-27 DIAGNOSIS — E1159 Type 2 diabetes mellitus with other circulatory complications: Secondary | ICD-10-CM | POA: Diagnosis not present

## 2020-08-27 DIAGNOSIS — D333 Benign neoplasm of cranial nerves: Secondary | ICD-10-CM | POA: Insufficient documentation

## 2020-08-27 LAB — BAYER DCA HB A1C WAIVED: HB A1C (BAYER DCA - WAIVED): 6.7 % (ref <7.0)

## 2020-08-27 NOTE — Assessment & Plan Note (Signed)
Improving symptoms of diabetes mellitus.  A1c 6.7%.  Patient would like to hold metformin for a few weeks to assess metallic taste in his home follow-up in 2 weeks.  Advised patient to keep blood sugar log and report hypoglycemia/hypoglycemia.  Patient verbalized understanding printed handouts given to patient.

## 2020-08-27 NOTE — Progress Notes (Signed)
Established Patient Office Visit  Subjective:  Patient ID: [name], female    DOB: [birth year]  Age: 80 y.o. MRN: [id number]  CC:  Chief Complaint  Patient presents with   Annual Exam    HPI [name] presents for .   Encounter for general adult medical examination Physical: Patient's last physical exam was 1 year ago .  Weight: Appropriate for height Blood Pressure: Normal (BP less than 120/80) ; yes Medical History: Patient history reviewed ; Family history reviewed ;  Allergies Reviewed: No change in current allergies ;  Medications Reviewed: Medications reviewed - no changes ;  Lipids: Normal lipid levels ; labs completed results pending Smoking: Life-long non-smoker ; yes Physical Activity: Exercises at least 3 times per week ; no Alcohol/Drug Use: Is a non-drinker ; No illicit drug use ;  Patient is not afflicted from Stress Incontinence and Urge Incontinence  Safety: reviewed ; Patient wears a seat belt, has smoke detectors, has carbon monoxide detectors, practices appropriate gun safety, and wears sunscreen with extended sun exposure. Dental Care: biannual cleanings, brushes and flosses daily. Ophthalmology/Optometry: Annual visit.  Hearing loss: none Vision impairments: none   Past Medical History:  Diagnosis Date   Anxiety    Arthritis    Bulging lumbar disc    Chronic lower back pain    Coronary atherosclerosis of native coronary artery    DES LAD 02/2008, DES LAD 02/2015, DES ramus and DES x2 mid RCA 02/2020   Depression    Dyslipidemia    Essential hypertension    Facial numbness    Frequent headaches    GERD (gastroesophageal reflux disease)    Glaucoma    Heart attack ([place])    Insomnia    Paroxysmal atrial fibrillation [facility])    Diagnosed October 2019   PSVT (paroxysmal supraventricular tachycardia) ([place])    Refusal of blood transfusions as patient is Jehovah's Witness    Thyroid disease    Type 2 diabetes mellitus ([place])    Borderline    Vitamin D deficiency     Past Surgical History:  Procedure Laterality Date   CARDIAC CATHETERIZATION N/A 02/23/2015   Procedure: Left Heart Cath and Coronary Angiography;  Surgeon: Sherren Mocha, MD;  Location: [place] [facility];  Service: Cardiovascular;  Laterality: N/A;   CARDIAC CATHETERIZATION N/A 02/23/2015   Procedure: Coronary Stent Intervention;  Surgeon: Sherren Mocha, MD;  Location: [place] [facility];  Service: Cardiovascular;  Laterality: N/A;   CARDIAC CATHETERIZATION N/A 01/04/2016   Procedure: Left Heart Cath and Coronary Angiography;  Surgeon: Peter M Martinique, MD;  Location: [place] [facility];  Service: Cardiovascular;  Laterality: N/A;   CATARACT EXTRACTION     CORONARY ANGIOPLASTY  02/23/2015   CORONARY ANGIOPLASTY WITH STENT PLACEMENT  2009   CORONARY STENT INTERVENTION N/A 03/09/2020   Procedure: CORONARY STENT INTERVENTION;  Surgeon: Jettie Booze, MD;  Location: [place] [facility];  Service: Cardiovascular;  Laterality: N/A;   DILATION AND CURETTAGE OF UTERUS     implantable loop recorder placement  04/01/2020   Medtronic Reveal Linq model LNQ 22 [id number]) implantable loop recorder    LEFT HEART CATH AND CORONARY ANGIOGRAPHY N/A 03/09/2020   Procedure: LEFT HEART CATH AND CORONARY ANGIOGRAPHY;  Surgeon: Jettie Booze, MD;  Location: [place] [facility];  Service: Cardiovascular;  Laterality: N/A;   TUBAL LIGATION      Family History  Problem Relation Age of Onset   Other Mother        "  natural causes"   Heart disease Mother    Cancer Father        unsure of type    Social History   Socioeconomic History   Marital status: Widowed    Spouse name: Not on file   Number of children: 8   Years of education: 10th grade   Highest education level: 10th grade  Occupational History   Occupation: RETIRED  Tobacco Use   Smoking status: Never   Smokeless tobacco: Never  Vaping Use   Vaping Use: Never used  Substance and Sexual Activity   Alcohol  use: Yes    Alcohol/week: 1.0 standard drink    Types: 1 Glasses of wine per week    Comment: RARE OCCASION   Drug use: No   Sexual activity: Not Currently  Other Topics Concern   Not on file  Social History Narrative   Lives at home alone.   Right-handed.   No caffeine use.   Social Determinants of Health   Financial Resource Strain: Low Risk    Difficulty of Paying Living Expenses: Not hard at all  Food Insecurity: No Food Insecurity   Worried About Charity fundraiser in the Last Year: Never true   [place] in the Last Year: Never true  Transportation Needs: No Transportation Needs   Lack of Transportation (Medical): No   Lack of Transportation (Non-Medical): No  Physical Activity: Inactive   Days of Exercise per Week: 0 days   Minutes of Exercise per Session: 0 min  Stress: No Stress Concern Present   Feeling of Stress : Not at all  Social Connections: Moderately Integrated   Frequency of Communication with Friends and Family: More than three times a week   Frequency of Social Gatherings with Friends and Family: More than three times a week   Attends Religious Services: More than 4 times per year   Active Member of [employer] or Organizations: Yes   Attends Archivist Meetings: More than 4 times per year   Marital Status: Widowed  Human resources officer Violence: Not At Risk   Fear of Current or Ex-Partner: No   Emotionally Abused: No   Physically Abused: No   Sexually Abused: No    Outpatient Medications Prior to Visit  Medication Sig Dispense Refill   amitriptyline (ELAVIL) 10 MG tablet Take 1 tablet (10 mg total) by mouth at bedtime. 60 tablet 2   apixaban (ELIQUIS) 5 MG TABS tablet Take 1 tablet (5 mg total) by mouth 2 (two) times daily. 28 tablet 0   Ascorbic Acid (VITAMIN C) 500 MG tablet Take 500 mg by mouth daily.       bimatoprost (LUMIGAN) 0.01 % SOLN Place 1 drop into both eyes at bedtime.     Calcium Carbonate Antacid (TUMS PO) Take 2 tablets by  mouth 4 (four) times daily as needed (for acid reflux/indigestion).      Cholecalciferol (VITAMIN D) 400 UNITS capsule Take 400 Units by mouth daily.       ciclopirox (PENLAC) 8 % solution Apply topically.     clopidogrel (PLAVIX) 75 MG tablet Take 1 tablet (75 mg total) by mouth daily with breakfast. 90 tablet 3   Cyanocobalamin (VITAMIN B 12 PO) Take 1 tablet by mouth daily.     diclofenac Sodium (VOLTAREN) 1 % GEL Apply 2 g topically 4 (four) times daily. 50 g 2   diltiazem (CARDIZEM CD) 240 MG 24 hr capsule Take 1 capsule (240 mg total)  by mouth daily. 90 capsule 3   Garlic 643 MG TABS Take 1 tablet by mouth daily.     HYDROcodone-acetaminophen (NORCO) 10-325 MG tablet Take 1 tablet by mouth 4 (four) times daily as needed. For pain.     levothyroxine (SYNTHROID) 75 MCG tablet Take 1 tablet (75 mcg total) by mouth daily before breakfast. 30 tablet 3   losartan (COZAAR) 50 MG tablet Take 1 tablet (50 mg total) by mouth daily. 90 tablet 3   MAGNESIUM CARBONATE PO Take 1 tablet by mouth daily. 400 mg     metFORMIN (GLUCOPHAGE-XR) 500 MG 24 hr tablet Take 1 tablet by mouth daily.      metoprolol tartrate (LOPRESSOR) 100 MG tablet TAKE ONE TABLET BY MOUTH TWICE DAILY 180 tablet 3   nitroGLYCERIN (NITROSTAT) 0.4 MG SL tablet Place 0.4 mg under the tongue every 5 (five) minutes as needed for chest pain.     promethazine (PHENERGAN) 25 MG tablet 1 tablet as needed.     rosuvastatin (CRESTOR) 20 MG tablet Take 1 tablet (20 mg total) by mouth daily. 90 tablet 3   timolol (TIMOPTIC) 0.5 % ophthalmic solution Place 1 drop into both eyes daily.     traZODone (DESYREL) 100 MG tablet Take 1 tablet (100 mg total) by mouth at bedtime. 90 tablet 1   TURMERIC PO Take 1 tablet by mouth daily.     VENTOLIN HFA 108 (90 Base) MCG/ACT inhaler Inhale 1-2 puffs into the lungs every 4 (four) hours as needed for shortness of breath.     furosemide (LASIX) 20 MG tablet Take 1 tablet (20 mg total) by mouth daily as needed  for fluid. 30 tablet 1   potassium chloride (KLOR-CON) 10 MEQ tablet Take 1 tablet (10 mEq total) by mouth daily as needed (on days lasix is taken). 30 tablet 1   No facility-administered medications prior to visit.    No Known Allergies  ROS Review of Systems  Constitutional: Negative.   HENT: Negative.    Respiratory: Negative.    Cardiovascular: Negative.   Gastrointestinal: Negative.   Genitourinary: Negative.   Musculoskeletal: Negative.   Skin:  Negative for rash.  All other systems reviewed and are negative.    Objective:    Physical Exam Vitals and nursing note reviewed.  Constitutional:      General: She is awake.     Appearance: Normal appearance. She is well-groomed.  HENT:     Head: Normocephalic.     Right Ear: Ear canal and external ear normal.     Nose: Nose normal.     Mouth/Throat:     Mouth: Mucous membranes are moist.     Pharynx: Oropharynx is clear.  Eyes:     Conjunctiva/sclera: Conjunctivae normal.     Pupils: Pupils are equal, round, and reactive to light.  Cardiovascular:     Pulses: Normal pulses.  Pulmonary:     Effort: Pulmonary effort is normal.     Breath sounds: Normal breath sounds.  Abdominal:     General: Bowel sounds are normal.  Skin:    Findings: No rash.  Neurological:     Mental Status: She is alert and oriented to person, place, and time.  Psychiatric:        Mood and Affect: Mood is anxious and depressed.        Behavior: Behavior normal. Behavior is cooperative.    BP (!) 139/55   Pulse 80   Temp (!) 97.1 F (36.2 C) (  Temporal)   Ht 5\' 5"  (1.651 m)   Wt 169 lb (76.7 kg)   LMP  (LMP Unknown)   SpO2 98%   BMI 28.12 kg/m  Wt Readings from Last 3 Encounters:  08/27/20 169 lb (76.7 kg)  08/12/20 169 lb (76.7 kg)  07/30/20 170 lb 9.6 oz (77.4 kg)     Health Maintenance Due  Topic Date Due   FOOT EXAM  Never done   OPHTHALMOLOGY EXAM  Never done   Zoster Vaccines- Shingrix (1 of 2) Never done   DEXA SCAN   Never done   COVID-19 Vaccine (4 - Booster for Moderna series) 04/01/2020    There are no preventive care reminders to display for this patient.  Lab Results  Component Value Date   TSH 0.745 08/06/2020   Lab Results  Component Value Date   WBC 9.3 03/11/2020   HGB 11.1 (L) 03/11/2020   HCT 32.0 (L) 03/11/2020   MCV 91.2 03/11/2020   PLT 164 03/11/2020   Lab Results  Component Value Date   NA 138 03/11/2020   K 3.7 03/11/2020   CO2 23 03/11/2020   GLUCOSE 147 (H) 03/11/2020   BUN 9 03/11/2020   CREATININE 0.86 03/11/2020   BILITOT 0.4 03/08/2020   ALKPHOS 67 03/08/2020   AST 20 03/08/2020   ALT 16 03/08/2020   PROT 7.5 03/08/2020   ALBUMIN 4.3 03/08/2020   CALCIUM 8.8 (L) 03/11/2020   ANIONGAP 10 03/11/2020   Lab Results  Component Value Date   CHOL 124 03/09/2020   Lab Results  Component Value Date   HDL 51 03/09/2020   Lab Results  Component Value Date   LDLCALC 61 03/09/2020   Lab Results  Component Value Date   TRIG 58 03/09/2020   Lab Results  Component Value Date   CHOLHDL 2.4 03/09/2020   Lab Results  Component Value Date   HGBA1C 6.7 08/27/2020   Flowsheet Row Office Visit from 08/27/2020 in [facility]  PHQ-9 Total Score 18       GAD 7 : Generalized Anxiety Score 08/27/2020 10/02/2019  Nervous, Anxious, on Edge 3 2  Control/stop worrying 3 3  Worry too much - different things 3 3  Trouble relaxing 3 3  Restless 2 3  Easily annoyed or irritable 3 1  Afraid - awful might happen 3 1  Total GAD 7 Score 20 16  Anxiety Difficulty Very difficult Somewhat difficult       Assessment & Plan:   Problem List Items Addressed This Visit       Endocrine   Diabetes mellitus ([place])    Improving symptoms of diabetes mellitus.  A1c 6.7%.  Patient would like to hold metformin for a few weeks to assess metallic taste in his home follow-up in 2 weeks.  Advised patient to keep blood sugar log and report hypoglycemia/hypoglycemia.   Patient verbalized understanding printed handouts given to patient.       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  Labs completed-CBC, CMP, lipid panel,       Relevant Orders   CBC with Differential  Comprehensive metabolic panel   Lipid Panel   Thyroid Panel With TSH   Anxiety with depression    Symptoms not well controlled.  Patient still to continue on Elavil 10 mg tablet by mouth at bedtime.  Patient reports current health symptoms/tenderness and tongue is causing her depression and anxiety to worsen.  Provided education to patient with printed handouts given on the need for depression and anxiety management patient verbalized understanding.  We will reassess in 3 months.       Other Visit Diagnoses     Controlled substance agreement signed       Relevant Orders   ToxASSURE Select 13 (MW), Urine       No orders of the defined types were placed in this encounter.   Follow-up: Return in about 3 months (around 11/27/2020).    Ivy Lynn, NP

## 2020-08-27 NOTE — Assessment & Plan Note (Signed)
Completed physical assessment, provided education on preventative care and health maintenance.  Labs completed-CBC, CMP, lipid panel,

## 2020-08-27 NOTE — Patient Instructions (Signed)

## 2020-08-27 NOTE — Assessment & Plan Note (Signed)
Symptoms not well controlled.  Patient still to continue on Elavil 10 mg tablet by mouth at bedtime.  Patient reports current health symptoms/tenderness and tongue is causing her depression and anxiety to worsen.  Provided education to patient with printed handouts given on the need for depression and anxiety management patient verbalized understanding.  We will reassess in 3 months.

## 2020-08-28 LAB — COMPREHENSIVE METABOLIC PANEL
ALT: 16 IU/L (ref 0–32)
AST: 19 IU/L (ref 0–40)
Albumin/Globulin Ratio: 2 (ref 1.2–2.2)
Albumin: 4.3 g/dL (ref 3.7–4.7)
Alkaline Phosphatase: 91 IU/L (ref 44–121)
BUN/Creatinine Ratio: 11 — ABNORMAL LOW (ref 12–28)
BUN: 10 mg/dL (ref 8–27)
Bilirubin Total: 0.3 mg/dL (ref 0.0–1.2)
CO2: 25 mmol/L (ref 20–29)
Calcium: 9.7 mg/dL (ref 8.7–10.3)
Chloride: 99 mmol/L (ref 96–106)
Creatinine, Ser: 0.92 mg/dL (ref 0.57–1.00)
Globulin, Total: 2.2 g/dL (ref 1.5–4.5)
Glucose: 139 mg/dL — ABNORMAL HIGH (ref 65–99)
Potassium: 4.3 mmol/L (ref 3.5–5.2)
Sodium: 141 mmol/L (ref 134–144)
Total Protein: 6.5 g/dL (ref 6.0–8.5)
eGFR: 63 mL/min/{1.73_m2} (ref >59)

## 2020-08-28 LAB — CBC WITH DIFFERENTIAL/PLATELET
Basophils Absolute: 0 10*3/uL (ref 0.0–0.2)
Basos: 1 %
EOS (ABSOLUTE): 0.3 10*3/uL (ref 0.0–0.4)
Eos: 5 %
Hematocrit: 38.6 % (ref 34.0–46.6)
Hemoglobin: 12.9 g/dL (ref 11.1–15.9)
Immature Grans (Abs): 0 10*3/uL (ref 0.0–0.1)
Immature Granulocytes: 0 %
Lymphocytes Absolute: 1.8 10*3/uL (ref 0.7–3.1)
Lymphs: 33 %
MCH: 28.5 pg (ref 26.6–33.0)
MCHC: 33.4 g/dL (ref 31.5–35.7)
MCV: 85 fL (ref 79–97)
Monocytes Absolute: 0.5 10*3/uL (ref 0.1–0.9)
Monocytes: 9 %
Neutrophils Absolute: 2.9 10*3/uL (ref 1.4–7.0)
Neutrophils: 52 %
Platelets: 185 10*3/uL (ref 150–450)
RBC: 4.53 x10E6/uL (ref 3.77–5.28)
RDW: 12.7 % (ref 11.7–15.4)
WBC: 5.6 10*3/uL (ref 3.4–10.8)

## 2020-08-28 LAB — THYROID PANEL WITH TSH
Free Thyroxine Index: 2.8 (ref 1.2–4.9)
T3 Uptake Ratio: 27 % (ref 24–39)
T4, Total: 10.2 ug/dL (ref 4.5–12.0)
TSH: 0.552 u[IU]/mL (ref 0.450–4.500)

## 2020-08-28 LAB — LIPID PANEL
Chol/HDL Ratio: 2.4 ratio (ref 0.0–4.4)
Cholesterol, Total: 109 mg/dL (ref 100–199)
HDL: 46 mg/dL (ref >39)
LDL Chol Calc (NIH): 37 mg/dL (ref 0–99)
Triglycerides: 155 mg/dL — ABNORMAL HIGH (ref 0–149)
VLDL Cholesterol Cal: 26 mg/dL (ref 5–40)

## 2020-09-02 LAB — TOXASSURE SELECT 13 (MW), URINE

## 2020-09-03 ENCOUNTER — Telehealth: Payer: Self-pay | Admitting: Nurse Practitioner

## 2020-09-03 NOTE — Telephone Encounter (Signed)
Patient's daughter called to report mom has been holding Metformin since her visit with you.  Her blood sugars have been good but today it was 247 after eating and she wanted to let you know.  She did not check it fasting this morning.

## 2020-09-06 NOTE — Progress Notes (Signed)
Carelink Summary Report / Loop Recorder 

## 2020-09-06 NOTE — Telephone Encounter (Signed)
Daughter will record fasting blood sugars and call with report. Tongue is fine

## 2020-09-14 ENCOUNTER — Telehealth: Payer: Self-pay | Admitting: Nurse Practitioner

## 2020-09-14 ENCOUNTER — Other Ambulatory Visit: Payer: Self-pay | Admitting: Nurse Practitioner

## 2020-09-14 DIAGNOSIS — M545 Low back pain, unspecified: Secondary | ICD-10-CM

## 2020-09-14 MED ORDER — HYDROCODONE-ACETAMINOPHEN 5-325 MG PO TABS: 1.0000 | ORAL_TABLET | Freq: Four times a day (QID) | ORAL | 0 refills | Status: DC | PRN

## 2020-09-14 NOTE — Telephone Encounter (Signed)
Lmtcb.

## 2020-09-14 NOTE — Telephone Encounter (Signed)
Has not been on it in a long time she was trying to maintain it for a long time with tylenol and other otc medication with no relief

## 2020-09-15 ENCOUNTER — Telehealth: Payer: Self-pay | Admitting: Cardiology

## 2020-09-15 ENCOUNTER — Other Ambulatory Visit: Payer: Self-pay | Admitting: *Deleted

## 2020-09-15 MED ORDER — APIXABAN 5 MG PO TABS: 5.0000 mg | ORAL_TABLET | Freq: Two times a day (BID) | ORAL | 3 refills | Status: DC

## 2020-09-15 NOTE — Telephone Encounter (Signed)
Patient aware and verbalized understanding. °

## 2020-09-15 NOTE — Telephone Encounter (Signed)
New messsage    Patient calling the office for samples of medication:   1.  What medication and dosage are you requesting samples for? eliquis  2.  Are you currently out of this medication? Not yet , needs help filling out financial applicaton

## 2020-09-15 NOTE — Telephone Encounter (Signed)
New message     Patient returning a call to someone

## 2020-09-15 NOTE — Telephone Encounter (Signed)
Pt brining Eliquis paperwork to drop off.

## 2020-09-16 ENCOUNTER — Encounter: Payer: Self-pay | Admitting: *Deleted

## 2020-09-16 ENCOUNTER — Ambulatory Visit (INDEPENDENT_AMBULATORY_CARE_PROVIDER_SITE_OTHER): Payer: Medicare Other

## 2020-09-16 DIAGNOSIS — I4819 Other persistent atrial fibrillation: Secondary | ICD-10-CM | POA: Diagnosis not present

## 2020-09-16 NOTE — Progress Notes (Signed)
Faxed notification received from [employer] that eliquis 5 mg BID approved from 09/15/20 through 02/19/2021

## 2020-09-17 LAB — CUP PACEART REMOTE DEVICE CHECK
Date Time Interrogation Session: 20220726204207
Implantable Pulse Generator Implant Date: 20220210

## 2020-09-29 DIAGNOSIS — H26491 Other secondary cataract, right eye: Secondary | ICD-10-CM | POA: Diagnosis not present

## 2020-09-29 DIAGNOSIS — H04123 Dry eye syndrome of bilateral lacrimal glands: Secondary | ICD-10-CM | POA: Diagnosis not present

## 2020-09-29 DIAGNOSIS — E119 Type 2 diabetes mellitus without complications: Secondary | ICD-10-CM | POA: Diagnosis not present

## 2020-09-29 DIAGNOSIS — H401131 Primary open-angle glaucoma, bilateral, mild stage: Secondary | ICD-10-CM | POA: Diagnosis not present

## 2020-09-29 DIAGNOSIS — Z961 Presence of intraocular lens: Secondary | ICD-10-CM | POA: Diagnosis not present

## 2020-10-12 NOTE — Progress Notes (Signed)
Carelink Summary Report / Loop Recorder 

## 2020-10-18 ENCOUNTER — Telehealth: Payer: Self-pay | Admitting: Cardiology

## 2020-10-18 NOTE — Telephone Encounter (Signed)
Patient's daughter [name]) notified and verbalized understanding.  She will call back to cancel the office visit if she does decide to take her to the ED.

## 2020-10-18 NOTE — Telephone Encounter (Signed)
Returned call to [name] (daughter).  BP this morning was 144/81 - HR  73. Has not weighed her self recently at home.  SOB, nausea, fatigue & headache x 2-3 days.  States that her chest feels sore.  Did have some congestion / cough - daughter been giving her Coricidin for this.  No fever.   Been drinking Ensure since appetite not been good lately.  Daughter states that she has felt this way every time before she has a heart attack in the past.  She is scheduled to see [name] this Thursday, 10/21/2020.  No availability with any providers before then.  Informed daughter that she may need to be seen in ED if symptoms worsen before then, but will send to provider for advice in the meantime.

## 2020-10-18 NOTE — Telephone Encounter (Signed)
Daughter [name] called stating that patient continues to have shortness of breath. States that she is not eating like she should. Wanting to be seen sooner than Thursday.

## 2020-10-19 ENCOUNTER — Ambulatory Visit (INDEPENDENT_AMBULATORY_CARE_PROVIDER_SITE_OTHER): Payer: Medicare Other

## 2020-10-19 DIAGNOSIS — I4819 Other persistent atrial fibrillation: Secondary | ICD-10-CM

## 2020-10-19 LAB — CUP PACEART REMOTE DEVICE CHECK
Date Time Interrogation Session: 20220828204454
Implantable Pulse Generator Implant Date: 20220210

## 2020-10-20 NOTE — Progress Notes (Deleted)
Cardiology Office Note  Date: 10/20/2020   ID: [name], DOB Dec 12, 1940, MRN [id number]  PCP:  Ivy Lynn, NP  Cardiologist:  Rozann Lesches, MD Electrophysiologist:  Thompson Grayer, MD   Chief Complaint: *EKG* patient has no energy, light headedness, SOB, patient feels like there is an issue with Afib  History of Present Illness: [name] is a 80 y.o. female with a history of CAD, atrial fibrillation, HLD, HTN, GERD, PSVT, DM2, PSVT, thyroid disease, vitamin D deficiency.  She was last seen by Dr. Rayann Heman on 07/30/2020 for routine electrophysiology follow-up.  He mentions she did have atrial fibrillation in May a couple weeks after her COVID booster with some accompanying shortness of breath and fatigue.  She had converted to normal sinus rhythm after 5 days.  She had improved with diuresis.  At visit she denies any palpitations, chest pain, lower extremity edema, dizziness, presyncope or syncope.  Her atrial fibrillation burden was low overall per Dr. Rayann Heman.  She was continuing Eliquis for CHA2DS2-VASc score of 6.  Options for treatment were discussed during that visit including watchful waiting, Tikosyn, ablation.  At that point she preferred watchful waiting.  Dr. Rayann Heman mention would consider options if A. fib burden increased.  Her blood pressure was stable and required no changes.  She had no ischemic symptoms.  Lasix had been started by atrial fibrillation clinic for chronic diastolic dysfunction.  He had NYHA class II symptoms.  Telephone call with patient's daughter on 10/18/2020 patient was complaining of shortness of breath, nausea, fatigue and headache x2 to 3 days.  Stated her chest felt sore.  Did have some congestion.  Daughter stated that she had felt this way every time before she had a heart attack in the past.   Probably need to refer her back to Dr. Rayann Heman or A. fib clinic to see if she has changed her mind about Tikosyn or ablation  Similar symptoms prior  to MI per daughter. No energy, light headedness, SOB, patient feels like there is an issue with Afib   Past Medical History:  Diagnosis Date   Anxiety    Arthritis    Bulging lumbar disc    Chronic lower back pain    Coronary atherosclerosis of native coronary artery    DES LAD 02/2008, DES LAD 02/2015, DES ramus and DES x2 mid RCA 02/2020   Depression    Dyslipidemia    Essential hypertension    Facial numbness    Frequent headaches    GERD (gastroesophageal reflux disease)    Glaucoma    Heart attack ([place])    Insomnia    Paroxysmal atrial fibrillation [facility])    Diagnosed October 2019   PSVT (paroxysmal supraventricular tachycardia) ([place])    Refusal of blood transfusions as patient is Jehovah's Witness    Thyroid disease    Type 2 diabetes mellitus ([place])    Borderline   Vitamin D deficiency     Past Surgical History:  Procedure Laterality Date   CARDIAC CATHETERIZATION N/A 02/23/2015   Procedure: Left Heart Cath and Coronary Angiography;  Surgeon: Sherren Mocha, MD;  Location: [place] [facility];  Service: Cardiovascular;  Laterality: N/A;   CARDIAC CATHETERIZATION N/A 02/23/2015   Procedure: Coronary Stent Intervention;  Surgeon: Sherren Mocha, MD;  Location: [place] [facility];  Service: Cardiovascular;  Laterality: N/A;   CARDIAC CATHETERIZATION N/A 01/04/2016   Procedure: Left Heart Cath and Coronary Angiography;  Surgeon: Peter M Martinique, MD;  Location: [place] [facility];  Service: Cardiovascular;  Laterality: N/A;   CATARACT EXTRACTION     CORONARY ANGIOPLASTY  02/23/2015   CORONARY ANGIOPLASTY WITH STENT PLACEMENT  2009   CORONARY STENT INTERVENTION N/A 03/09/2020   Procedure: CORONARY STENT INTERVENTION;  Surgeon: Jettie Booze, MD;  Location: [place] [facility];  Service: Cardiovascular;  Laterality: N/A;   DILATION AND CURETTAGE OF UTERUS     implantable loop recorder placement  04/01/2020   Medtronic Reveal Linq model LNQ [id number]) implantable loop  recorder    LEFT HEART CATH AND CORONARY ANGIOGRAPHY N/A 03/09/2020   Procedure: LEFT HEART CATH AND CORONARY ANGIOGRAPHY;  Surgeon: Jettie Booze, MD;  Location: [place] [facility];  Service: Cardiovascular;  Laterality: N/A;   TUBAL LIGATION      Current Outpatient Medications  Medication Sig Dispense Refill   amitriptyline (ELAVIL) 10 MG tablet Take 1 tablet (10 mg total) by mouth at bedtime. 60 tablet 2   apixaban (ELIQUIS) 5 MG TABS tablet Take 1 tablet (5 mg total) by mouth 2 (two) times daily. 180 tablet 3   Ascorbic Acid (VITAMIN C) 500 MG tablet Take 500 mg by mouth daily.       bimatoprost (LUMIGAN) 0.01 % SOLN Place 1 drop into both eyes at bedtime.     Calcium Carbonate Antacid (TUMS PO) Take 2 tablets by mouth 4 (four) times daily as needed (for acid reflux/indigestion).      Cholecalciferol (VITAMIN D) 400 UNITS capsule Take 400 Units by mouth daily.       ciclopirox (PENLAC) 8 % solution Apply topically.     clopidogrel (PLAVIX) 75 MG tablet Take 1 tablet (75 mg total) by mouth daily with breakfast. 90 tablet 3   Cyanocobalamin (VITAMIN B 12 PO) Take 1 tablet by mouth daily.     diclofenac Sodium (VOLTAREN) 1 % GEL Apply 2 g topically 4 (four) times daily. 50 g 2   diltiazem (CARDIZEM CD) 240 MG 24 hr capsule Take 1 capsule (240 mg total) by mouth daily. 90 capsule 3   Garlic 123XX123 MG TABS Take 1 tablet by mouth daily.     HYDROcodone-acetaminophen (NORCO) 5-325 MG tablet Take 1 tablet by mouth every 6 (six) hours as needed for moderate pain. 120 tablet 0   levothyroxine (SYNTHROID) 75 MCG tablet Take 1 tablet (75 mcg total) by mouth daily before breakfast. 30 tablet 3   losartan (COZAAR) 50 MG tablet Take 1 tablet (50 mg total) by mouth daily. 90 tablet 3   MAGNESIUM CARBONATE PO Take 1 tablet by mouth daily. 400 mg     metFORMIN (GLUCOPHAGE-XR) 500 MG 24 hr tablet Take 1 tablet by mouth daily.      metoprolol tartrate (LOPRESSOR) 100 MG tablet TAKE ONE TABLET BY MOUTH  TWICE DAILY 180 tablet 3   nitroGLYCERIN (NITROSTAT) 0.4 MG SL tablet Place 0.4 mg under the tongue every 5 (five) minutes as needed for chest pain.     promethazine (PHENERGAN) 25 MG tablet 1 tablet as needed.     rosuvastatin (CRESTOR) 20 MG tablet Take 1 tablet (20 mg total) by mouth daily. 90 tablet 3   timolol (TIMOPTIC) 0.5 % ophthalmic solution Place 1 drop into both eyes daily.     traZODone (DESYREL) 100 MG tablet Take 1 tablet (100 mg total) by mouth at bedtime. 90 tablet 1   TURMERIC PO Take 1 tablet by mouth daily.     VENTOLIN HFA 108 (90 Base)  MCG/ACT inhaler Inhale 1-2 puffs into the lungs every 4 (four) hours as needed for shortness of breath.     No current facility-administered medications for this visit.   Allergies:  Patient has no known allergies.   Social History: The patient  reports that she has never smoked. She has never used smokeless tobacco. She reports current alcohol use of about 1.0 standard drink per week. She reports that she does not use drugs.   Family History: The patient's family history includes Cancer in her father; Heart disease in her mother; Other in her mother.   ROS:  Please see the history of present illness. Otherwise, complete review of systems is positive for {NONE DEFAULTED:18576}.  All other systems are reviewed and negative.   Physical Exam: VS:  LMP  (LMP Unknown) , BMI There is no height or weight on file to calculate BMI.  Wt Readings from Last 3 Encounters:  08/27/20 169 lb (76.7 kg)  08/12/20 169 lb (76.7 kg)  07/30/20 170 lb 9.6 oz (77.4 kg)    General: Patient appears comfortable at rest. HEENT: Conjunctiva and lids normal, oropharynx clear with moist mucosa. Neck: Supple, no elevated JVP or carotid bruits, no thyromegaly. Lungs: Clear to auscultation, nonlabored breathing at rest. Cardiac: Regular rate and rhythm, no S3 or significant systolic murmur, no pericardial rub. Abdomen: Soft, nontender, no hepatomegaly, bowel sounds  present, no guarding or rebound. Extremities: No pitting edema, distal pulses 2+. Skin: Warm and dry. Musculoskeletal: No kyphosis. Neuropsychiatric: Alert and oriented x3, affect grossly appropriate.  ECG:  {EKG/Telemetry Strips Reviewed:[phone]}  Recent Labwork: 03/11/2020: Magnesium 1.9 08/27/2020: ALT 16; AST 19; BUN 10; Creatinine, Ser 0.92; Hemoglobin 12.9; Platelets 185; Potassium 4.3; Sodium 141; TSH 0.552     Component Value Date/Time   CHOL 109 08/27/2020 1518   TRIG 155 (H) 08/27/2020 1518   HDL 46 08/27/2020 1518   CHOLHDL 2.4 08/27/2020 1518   CHOLHDL 2.4 03/09/2020 0209   VLDL 12 03/09/2020 0209   LDLCALC 37 08/27/2020 1518    Other Studies Reviewed Today:   Cardiac catheterization 03/09/2020: Prior LAD stents were patient. Ramus-1 lesion is 95% stenosed. A drug-eluting stent was successfully placed using a STENT RESOLUTE ONYX 2.5X22, postdilated to 2.75 mm. T Post intervention, there is a 0% residual stenosis. Ramus-2 lesion is 50% stenosed in tortuous segment distal to the stent. Post intervention, there is a 0% residual stenosis. Mid RCA-1 lesion is 75% stenosed. A drug-eluting stent was successfully placed using a STENT RESOLUTE ONYX 2.5X26, postdilated to 2.75 mm. Post intervention, there is a 0% residual stenosis. Mid RCA-2 lesion is 75% stenosed, likely edge dissection in tortuous segment. A drug-eluting stent was successfully placed using a STENT RESOLUTE ONYX 2.5X8, A drug-eluting stent was successfully placed using a STENT RESOLUTE ONYX 2.5X8, postdilated to 2.75 mm. Ost Cx to Prox Cx lesion is 50% stenosed. The left ventricular systolic function is normal. LV end diastolic pressure is mildly elevated. The left ventricular ejection fraction is 50-55% by visual estimate. There is no aortic valve stenosis. A drug-eluting stent was successfully placed using a STENT RESOLUTE ONYX 2.5X22. A drug-eluting stent was successfully placed using a STENT RESOLUTE ONYX  2.5X26. A drug-eluting stent was successfully placed using a STENT RESOLUTE ONYX 2.5X8. Diagnostic Dominance: Right Intervention            Echocardiogram 03/10/2020:  1. Anterior and basal to mid anterolateral hypokinesis. Posterolateral  hypokinesis. Left ventricular ejection fraction, by estimation, is 55 to  60%. The  left ventricle has normal function. The left ventricle  demonstrates regional wall motion  abnormalities (see scoring diagram/findings for description). There is  mild concentric left ventricular hypertrophy. Left ventricular diastolic  parameters are consistent with Grade II diastolic dysfunction  (pseudonormalization). Elevated left ventricular   end-diastolic pressure.   2. Right ventricular systolic function is normal. The right ventricular  size is normal. There is moderately elevated pulmonary artery systolic  pressure.   3. Right atrial size was mildly dilated.   4. The mitral valve is normal in structure. Mild mitral valve  regurgitation. No evidence of mitral stenosis.   5. Tricuspid valve regurgitation is moderate to severe.   6. The aortic valve is normal in structure. There is mild calcification  of the aortic valve. There is mild thickening of the aortic valve. Aortic  valve regurgitation is moderate. No aortic stenosis is present. Aortic  regurgitation PHT measures 457 msec.   7. The inferior vena cava is normal in size with greater than 50%  respiratory variability, suggesting right atrial pressure of 3 mmHg.     Assessment and Plan:  1. CAD in native artery   2. Paroxysmal atrial fibrillation (HCC)   3. Acquired thrombophilia ([place])   4. Mixed hyperlipidemia      Medication Adjustments/Labs and Tests Ordered: Current medicines are reviewed at length with the patient today.  Concerns regarding medicines are outlined above.   Disposition: Follow-up with ***  Signed, Levell July, NP 10/20/2020 9:07 PM    [place] at [facility] [address], [place], [place] [address] Phone: [phone]; Fax: [phone]

## 2020-10-21 ENCOUNTER — Ambulatory Visit: Payer: Medicare Other | Admitting: Family Medicine

## 2020-10-21 ENCOUNTER — Other Ambulatory Visit: Payer: Self-pay

## 2020-10-21 DIAGNOSIS — I2510 Atherosclerotic heart disease of native coronary artery without angina pectoris: Secondary | ICD-10-CM

## 2020-10-21 DIAGNOSIS — D6869 Other thrombophilia: Secondary | ICD-10-CM

## 2020-10-21 DIAGNOSIS — I480 Paroxysmal atrial fibrillation: Secondary | ICD-10-CM

## 2020-10-21 DIAGNOSIS — E782 Mixed hyperlipidemia: Secondary | ICD-10-CM

## 2020-11-01 NOTE — Progress Notes (Signed)
Carelink Summary Report / Loop Recorder 

## 2020-11-22 ENCOUNTER — Ambulatory Visit (INDEPENDENT_AMBULATORY_CARE_PROVIDER_SITE_OTHER): Payer: Medicare Other

## 2020-11-22 DIAGNOSIS — I4819 Other persistent atrial fibrillation: Secondary | ICD-10-CM | POA: Diagnosis not present

## 2020-11-22 LAB — CUP PACEART REMOTE DEVICE CHECK
Date Time Interrogation Session: 20221002000011
Implantable Pulse Generator Implant Date: 20220210

## 2020-11-26 DIAGNOSIS — Z8639 Personal history of other endocrine, nutritional and metabolic disease: Secondary | ICD-10-CM | POA: Diagnosis not present

## 2020-11-26 DIAGNOSIS — E0844 Diabetes mellitus due to underlying condition with diabetic amyotrophy: Secondary | ICD-10-CM | POA: Diagnosis not present

## 2020-11-26 DIAGNOSIS — Z794 Long term (current) use of insulin: Secondary | ICD-10-CM | POA: Diagnosis not present

## 2020-11-26 DIAGNOSIS — R109 Unspecified abdominal pain: Secondary | ICD-10-CM | POA: Diagnosis not present

## 2020-11-26 DIAGNOSIS — S39012A Strain of muscle, fascia and tendon of lower back, initial encounter: Secondary | ICD-10-CM | POA: Diagnosis not present

## 2020-11-30 NOTE — Progress Notes (Signed)
Carelink Summary Report / Loop Recorder 

## 2020-12-03 ENCOUNTER — Other Ambulatory Visit: Payer: Self-pay

## 2020-12-03 ENCOUNTER — Ambulatory Visit (INDEPENDENT_AMBULATORY_CARE_PROVIDER_SITE_OTHER): Payer: Medicare Other

## 2020-12-03 ENCOUNTER — Encounter: Payer: Self-pay | Admitting: Nurse Practitioner

## 2020-12-03 ENCOUNTER — Telehealth: Payer: Self-pay | Admitting: Nurse Practitioner

## 2020-12-03 ENCOUNTER — Ambulatory Visit (INDEPENDENT_AMBULATORY_CARE_PROVIDER_SITE_OTHER): Payer: Medicare Other | Admitting: Nurse Practitioner

## 2020-12-03 VITALS — BP 125/54 | HR 67 | Temp 97.4°F | Resp 20 | Ht 65.0 in | Wt 166.0 lb

## 2020-12-03 DIAGNOSIS — I10 Essential (primary) hypertension: Secondary | ICD-10-CM | POA: Diagnosis not present

## 2020-12-03 DIAGNOSIS — E1159 Type 2 diabetes mellitus with other circulatory complications: Secondary | ICD-10-CM

## 2020-12-03 DIAGNOSIS — M545 Low back pain, unspecified: Secondary | ICD-10-CM | POA: Diagnosis not present

## 2020-12-03 DIAGNOSIS — E782 Mixed hyperlipidemia: Secondary | ICD-10-CM

## 2020-12-03 DIAGNOSIS — F418 Other specified anxiety disorders: Secondary | ICD-10-CM

## 2020-12-03 DIAGNOSIS — R7989 Other specified abnormal findings of blood chemistry: Secondary | ICD-10-CM

## 2020-12-03 DIAGNOSIS — Z23 Encounter for immunization: Secondary | ICD-10-CM | POA: Diagnosis not present

## 2020-12-03 DIAGNOSIS — Z0000 Encounter for general adult medical examination without abnormal findings: Secondary | ICD-10-CM

## 2020-12-03 DIAGNOSIS — M85832 Other specified disorders of bone density and structure, left forearm: Secondary | ICD-10-CM | POA: Diagnosis not present

## 2020-12-03 DIAGNOSIS — Z780 Asymptomatic menopausal state: Secondary | ICD-10-CM

## 2020-12-03 LAB — BAYER DCA HB A1C WAIVED: HB A1C (BAYER DCA - WAIVED): 6.5 % — ABNORMAL HIGH (ref 4.8–5.6)

## 2020-12-03 NOTE — Assessment & Plan Note (Addendum)
Hypertension well controlled on current medication, no changes to dose. Continue on low sodium diet, follow up in 3 months.  Education provided printed hand out given. Completed labs CBC, CMP,

## 2020-12-03 NOTE — Assessment & Plan Note (Signed)
Patient improved, symptoms well controlled on current medication dose.

## 2020-12-03 NOTE — Patient Instructions (Signed)
High Cholesterol High cholesterol is a condition in which the blood has high levels of a white, waxy substance similar to fat (cholesterol). The liver makes all the cholesterol that the body needs. The human body needs small amounts of cholesterol to help build cells. A person gets extra or excess cholesterol from the food that he or she eats. The blood carries cholesterol from the liver to the rest of the body. If you have high cholesterol, deposits (plaques) may build up on the walls of your arteries. Arteries are the blood vessels that carry blood away from your heart. These plaques make the arteries narrow and stiff. Cholesterol plaques increase your risk for heart attack and stroke. Work with your health care provider to keep your cholesterol levels in a healthy range. What increases the risk? The following factors may make you more likely to develop this condition: Eating foods that are high in animal fat (saturated fat) or cholesterol. Being overweight. Not getting enough exercise. A family history of high cholesterol (familial hypercholesterolemia). Use of tobacco products. Having diabetes. What are the signs or symptoms? In most cases, high cholesterol does not usually cause any symptoms. In severe cases, very high cholesterol levels can cause: Fatty bumps under the skin (xanthomas). A white or gray ring around the black center (pupil) of the eye. How is this diagnosed? This condition may be diagnosed based on the results of a blood test. If you are older than 80 years of age, your health care provider may check your cholesterol levels every 4-6 years. You may be checked more often if you have high cholesterol or other risk factors for heart disease. The blood test for cholesterol measures: "Bad" cholesterol, or LDL cholesterol. This is the main type of cholesterol that causes heart disease. The desired level is less than 100 mg/dL (2.59 mmol/L). "Good" cholesterol, or HDL  cholesterol. HDL helps protect against heart disease by cleaning the arteries and carrying the LDL to the liver for processing. The desired level for HDL is 60 mg/dL (1.55 mmol/L) or higher. Triglycerides. These are fats that your body can store or burn for energy. The desired level is less than 150 mg/dL (1.69 mmol/L). Total cholesterol. This measures the total amount of cholesterol in your blood and includes LDL, HDL, and triglycerides. The desired level is less than 200 mg/dL (5.17 mmol/L). How is this treated? Treatment for high cholesterol starts with lifestyle changes, such as diet and exercise. Diet changes. You may be asked to eat foods that have more fiber and less saturated fats or added sugar. Lifestyle changes. These may include regular exercise, maintaining a healthy weight, and quitting use of tobacco products. Medicines. These are given when diet and lifestyle changes have not worked. You may be prescribed a statin medicine to help lower your cholesterol levels. Follow these instructions at home: Eating and drinking  Eat a healthy, balanced diet. This diet includes: Daily servings of a variety of fresh, frozen, or canned fruits and vegetables. Daily servings of whole grain foods that are rich in fiber. Foods that are low in saturated fats and trans fats. These include poultry and fish without skin, lean cuts of meat, and low-fat dairy products. A variety of fish, especially oily fish that contain omega-3 fatty acids. Aim to eat fish at least 2 times a week. Avoid foods and drinks that have added sugar. Use healthy cooking methods, such as roasting, grilling, broiling, baking, poaching, steaming, and stir-frying. Do not fry your food except for stir-frying.  If you drink alcohol: Limit how much you have to: 0-1 drink a day for women who are not pregnant. 0-2 drinks a day for men. Know how much alcohol is in a drink. In the [place] one drink equals one 12 oz bottle of beer (355 mL),  one 5 oz glass of wine (148 mL), or one 1 oz glass of hard liquor (44 mL). Lifestyle  Get regular exercise. Aim to exercise for a total of 150 minutes a week. Increase your activity level by doing activities such as gardening, walking, and taking the stairs. Do not use any products that contain nicotine or tobacco. These products include cigarettes, chewing tobacco, and vaping devices, such as e-cigarettes. If you need help quitting, ask your health care provider. General instructions Take over-the-counter and prescription medicines only as told by your health care provider. Keep all follow-up visits. This is important. Where to find more information American Heart Association: www.heart.org National Heart, Lung, and Blood Institute: https://wilson-eaton.com/ Contact a health care provider if: You have trouble achieving or maintaining a healthy diet or weight. You are starting an exercise program. You are unable to stop smoking. Get help right away if: You have chest pain. You have trouble breathing. You have discomfort or pain in your jaw, neck, back, shoulder, or arm. You have any symptoms of a stroke. "BE FAST" is an easy way to remember the main warning signs of a stroke: B - Balance. Signs are dizziness, sudden trouble walking, or loss of balance. E - Eyes. Signs are trouble seeing or a sudden change in vision. F - Face. Signs are sudden weakness or numbness of the face, or the face or eyelid drooping on one side. A - Arms. Signs are weakness or numbness in an arm. This happens suddenly and usually on one side of the body. S - Speech. Signs are sudden trouble speaking, slurred speech, or trouble understanding what people say. T - Time. Time to call emergency services. Write down what time symptoms started. You have other signs of a stroke, such as: A sudden, severe headache with no known cause. Nausea or vomiting. Seizure. These symptoms may represent a serious problem that is an  emergency. Do not wait to see if the symptoms will go away. Get medical help right away. Call your local emergency services (911 in the [place] Do not drive yourself to the hospital. Summary Cholesterol plaques increase your risk for heart attack and stroke. Work with your health care provider to keep your cholesterol levels in a healthy range. Eat a healthy, balanced diet, get regular exercise, and maintain a healthy weight. Do not use any products that contain nicotine or tobacco. These products include cigarettes, chewing tobacco, and vaping devices, such as e-cigarettes. Get help right away if you have any symptoms of a stroke. This information is not intended to replace advice given to you by your health care provider. Make sure you discuss any questions you have with your health care provider. Document Revised: 04/22/2020 Document Reviewed: 04/12/2020 Elsevier Patient Education  2022 [place]. Diabetes Mellitus and [place] care is an important part of your health, especially when you have diabetes. Diabetes may cause you to have problems because of poor blood flow (circulation) to your feet and legs, which can cause your skin to: Become thinner and drier. Break more easily. Heal more slowly. Peel and crack. You may also have nerve damage (neuropathy) in your legs and feet, causing decreased feeling in them. This means that  you may not notice minor injuries to your feet that could lead to more serious problems. Noticing and addressing any potential problems early is the best way to prevent future foot problems. How to care for your feet Foot hygiene  Wash your feet daily with warm water and mild soap. Do not use hot water. Then, pat your feet and the areas between your toes until they are completely dry. Do not soak your feet as this can dry your skin. Trim your toenails straight across. Do not dig under them or around the cuticle. File the edges of your nails with an emery board  or nail file. Apply a moisturizing lotion or petroleum jelly to the skin on your feet and to dry, brittle toenails. Use lotion that does not contain alcohol and is unscented. Do not apply lotion between your toes. Shoes and socks Wear clean socks or stockings every day. Make sure they are not too tight. Do not wear knee-high stockings since they may decrease blood flow to your legs. Wear shoes that fit properly and have enough cushioning. Always look in your shoes before you put them on to be sure there are no objects inside. To break in new shoes, wear them for just a few hours a day. This prevents injuries on your feet. Wounds, scrapes, corns, and calluses  Check your feet daily for blisters, cuts, bruises, sores, and redness. If you cannot see the bottom of your feet, use a mirror or ask someone for help. Do not cut corns or calluses or try to remove them with medicine. If you find a minor scrape, cut, or break in the skin on your feet, keep it and the skin around it clean and dry. You may clean these areas with mild soap and water. Do not clean the area with peroxide, alcohol, or iodine. If you have a wound, scrape, corn, or callus on your foot, look at it several times a day to make sure it is healing and not infected. Check for: Redness, swelling, or pain. Fluid or blood. Warmth. Pus or a bad smell. General tips Do not cross your legs. This may decrease blood flow to your feet. Do not use heating pads or hot water bottles on your feet. They may burn your skin. If you have lost feeling in your feet or legs, you may not know this is happening until it is too late. Protect your feet from hot and cold by wearing shoes, such as at the beach or on hot pavement. Schedule a complete foot exam at least once a year (annually) or more often if you have foot problems. Report any cuts, sores, or bruises to your health care provider immediately. Where to find more information American Diabetes  Association: www.diabetes.org Association of Diabetes Care & Education Specialists: www.diabeteseducator.org Contact a health care provider if: You have a medical condition that increases your risk of infection and you have any cuts, sores, or bruises on your feet. You have an injury that is not healing. You have redness on your legs or feet. You feel burning or tingling in your legs or feet. You have pain or cramps in your legs and feet. Your legs or feet are numb. Your feet always feel cold. You have pain around any toenails. Get help right away if: You have a wound, scrape, corn, or callus on your foot and: You have pain, swelling, or redness that gets worse. You have fluid or blood coming from the wound, scrape, corn, or callus.  Your wound, scrape, corn, or callus feels warm to the touch. You have pus or a bad smell coming from the wound, scrape, corn, or callus. You have a fever. You have a red line going up your leg. Summary Check your feet every day for blisters, cuts, bruises, sores, and redness. Apply a moisturizing lotion or petroleum jelly to the skin on your feet and to dry, brittle toenails. Wear shoes that fit properly and have enough cushioning. If you have foot problems, report any cuts, sores, or bruises to your health care provider immediately. Schedule a complete foot exam at least once a year (annually) or more often if you have foot problems. This information is not intended to replace advice given to you by your health care provider. Make sure you discuss any questions you have with your health care provider. Document Revised: 08/28/2019 Document Reviewed: 08/28/2019 Elsevier Patient Education  [place].

## 2020-12-03 NOTE — Telephone Encounter (Signed)
Pts daughter states that she forgot to ask provider during pts visit if pt should continue to get PAPs and Mammograms at her age?  Please advise and call daughter [name]).

## 2020-12-03 NOTE — Assessment & Plan Note (Signed)
Symptoms well managed on current medication.

## 2020-12-03 NOTE — Progress Notes (Signed)
New Patient Office Visit  Subjective:  Patient ID: [name], female    DOB: 1941/02/15  Age: 80 y.o. MRN: [id number]  CC:  Chief Complaint  Patient presents with   Medical Management of Chronic Issues    3 mo     HPI [name] presents for Pt presents for follow up of hypertension. Patient was diagnosed in 11/12/2009 The patient is tolerating the medication well without side effects. Compliance with treatment has been good; including taking medication as directed , maintains a healthy diet and regular exercise regimen , and following up as directed.    Mixed hyperlipidemia  Pt presents with hyperlipidemia. Patient was diagnosed in 11/12/09 Compliance with treatment has been  good The patient is compliant with medications, maintains a low cholesterol diet , follows up as directed , and maintains an exercise regimen . The patient denies experiencing any hypercholesterolemia related symptoms.      The patient presents with history of type 2 diabetes mellitus without complications. Patient was diagnosed in 12/21/2017. Compliance with treatment has been good; the patient takes medication as directed , maintains a diabetic diet and an exercise regimen , follows up as directed , and is keeping a glucose diary. Patient specifically denies associated symptoms, including blurred vision, fatigue, polydipsia, polyphagia and polyuria . Patient denies hypoglycemia. In regard to preventative care, the patient performs foot self-exams daily and last ophthalmology exam was in 2021.   Past Medical History:  Diagnosis Date   Anxiety    Arthritis    Bulging lumbar disc    Chronic lower back pain    Coronary atherosclerosis of native coronary artery    DES LAD 02/2008, DES LAD 02/2015, DES ramus and DES x2 mid RCA 02/2020   Depression    Dyslipidemia    Essential hypertension    Facial numbness    Frequent headaches    GERD (gastroesophageal reflux disease)    Glaucoma    Heart attack ([place])     Insomnia    Paroxysmal atrial fibrillation [facility])    Diagnosed October 2019   PSVT (paroxysmal supraventricular tachycardia) ([place])    Refusal of blood transfusions as patient is Jehovah's Witness    Thyroid disease    Type 2 diabetes mellitus ([place])    Borderline   Vitamin D deficiency     Past Surgical History:  Procedure Laterality Date   CARDIAC CATHETERIZATION N/A 02/23/2015   Procedure: Left Heart Cath and Coronary Angiography;  Surgeon: Sherren Mocha, MD;  Location: [place] [facility];  Service: Cardiovascular;  Laterality: N/A;   CARDIAC CATHETERIZATION N/A 02/23/2015   Procedure: Coronary Stent Intervention;  Surgeon: Sherren Mocha, MD;  Location: [place] [facility];  Service: Cardiovascular;  Laterality: N/A;   CARDIAC CATHETERIZATION N/A 01/04/2016   Procedure: Left Heart Cath and Coronary Angiography;  Surgeon: Peter M Martinique, MD;  Location: [place] [facility];  Service: Cardiovascular;  Laterality: N/A;   CATARACT EXTRACTION     CORONARY ANGIOPLASTY  02/23/2015   CORONARY ANGIOPLASTY WITH STENT PLACEMENT  2009   CORONARY STENT INTERVENTION N/A 03/09/2020   Procedure: CORONARY STENT INTERVENTION;  Surgeon: Jettie Booze, MD;  Location: [place] [facility];  Service: Cardiovascular;  Laterality: N/A;   DILATION AND CURETTAGE OF UTERUS     implantable loop recorder placement  04/01/2020   Medtronic Reveal Linq model LNQ 22 [id number]) implantable loop recorder    LEFT HEART CATH AND CORONARY ANGIOGRAPHY N/A 03/09/2020   Procedure: LEFT  HEART CATH AND CORONARY ANGIOGRAPHY;  Surgeon: Jettie Booze, MD;  Location: [place] [facility];  Service: Cardiovascular;  Laterality: N/A;   TUBAL LIGATION      Family History  Problem Relation Age of Onset   Other Mother        "natural causes"   Heart disease Mother    Cancer Father        unsure of type    Social History   Socioeconomic History   Marital status: Widowed    Spouse name: Not on file   Number of  children: 8   Years of education: 10th grade   Highest education level: 10th grade  Occupational History   Occupation: RETIRED  Tobacco Use   Smoking status: Never   Smokeless tobacco: Never  Vaping Use   Vaping Use: Never used  Substance and Sexual Activity   Alcohol use: Yes    Alcohol/week: 1.0 standard drink    Types: 1 Glasses of wine per week    Comment: RARE OCCASION   Drug use: No   Sexual activity: Not Currently  Other Topics Concern   Not on file  Social History Narrative   Lives at home alone.   Right-handed.   No caffeine use.   Social Determinants of Health   Financial Resource Strain: Low Risk    Difficulty of Paying Living Expenses: Not hard at all  Food Insecurity: No Food Insecurity   Worried About Charity fundraiser in the Last Year: Never true   [place] in the Last Year: Never true  Transportation Needs: No Transportation Needs   Lack of Transportation (Medical): No   Lack of Transportation (Non-Medical): No  Physical Activity: Inactive   Days of Exercise per Week: 0 days   Minutes of Exercise per Session: 0 min  Stress: No Stress Concern Present   Feeling of Stress : Not at all  Social Connections: Moderately Integrated   Frequency of Communication with Friends and Family: More than three times a week   Frequency of Social Gatherings with Friends and Family: More than three times a week   Attends Religious Services: More than 4 times per year   Active Member of [employer] or Organizations: Yes   Attends Archivist Meetings: More than 4 times per year   Marital Status: Widowed  Human resources officer Violence: Not At Risk   Fear of Current or Ex-Partner: No   Emotionally Abused: No   Physically Abused: No   Sexually Abused: No    ROS Review of Systems  Constitutional: Negative.   HENT: Negative.    Eyes: Negative.   Respiratory: Negative.    Cardiovascular: Negative.   Gastrointestinal: Negative.   Genitourinary: Negative.    Musculoskeletal:  Positive for back pain.  Skin:  Negative for rash.  Psychiatric/Behavioral:  The patient is nervous/anxious.   All other systems reviewed and are negative.  Objective:   Today's Vitals: BP (!) 125/54   Pulse 67   Temp (!) 97.4 F (36.3 C)   Resp 20   Ht 5' 5" (1.651 m)   Wt 166 lb (75.3 kg)   LMP  (LMP Unknown)   SpO2 99%   BMI 27.62 kg/m   Physical Exam Vitals and nursing note reviewed. Exam conducted with a chaperone present (daughter).  HENT:     Head: Normocephalic.     Right Ear: Ear canal and external ear normal.     Left Ear: Ear canal and  external ear normal.     Nose: Nose normal.     Mouth/Throat:     Mouth: Mucous membranes are moist.     Pharynx: Oropharynx is clear.  Eyes:     Conjunctiva/sclera: Conjunctivae normal.  Cardiovascular:     Rate and Rhythm: Normal rate and regular rhythm.     Pulses: Normal pulses.  Pulmonary:     Effort: Pulmonary effort is normal.     Breath sounds: Normal breath sounds.  Abdominal:     General: Bowel sounds are normal.  Skin:    General: Skin is warm.     Findings: No rash.  Neurological:     Mental Status: She is oriented to person, place, and time.  Psychiatric:        Attention and Perception: Attention and perception normal.        Mood and Affect: Mood is anxious and depressed.        Speech: Speech normal.        Behavior: Behavior normal.        Thought Content: Thought content normal.        Cognition and Memory: Cognition and memory normal.   [place] Office Visit from 12/03/2020 in [facility]  PHQ-9 Total Score 10       GAD 7 : Generalized Anxiety Score 12/03/2020 08/27/2020 10/02/2019  Nervous, Anxious, on Edge 0 3 2  Control/stop worrying _0 Worry too much - different things _1 Trouble relaxing _2 Restless 0 2 3  Easily annoyed or irritable 0 3 1  Afraid - awful might happen 0 3 1  Total GAD 7 Score _3 Anxiety Difficulty Somewhat  difficult Very difficult Somewhat difficult     Assessment & Plan:   Problem List Items Addressed This Visit       Cardiovascular and Mediastinum   Essential hypertension    Hypertension well controlled on current medication, no changes to dose. Continue on low sodium diet, follow up in 3 months.  Education provided printed hand out given. Completed labs CBC, CMP,        Endocrine   Diabetes mellitus ([place]) - Primary    Completed A1c, foot exam, education provided to continue diabetic diet, take medication as prescribed and follow up in 3 months.       Relevant Orders   CBC with Differential/Platelet   CMP14+EGFR   Bayer DCA Hb A1c Waived (Completed)     Other   Mixed hyperlipidemia    Controlled on current medication , no changes to dose. Continue on low cholesterol diet. And follow up in 3 months.      Healthcare maintenance   Relevant Orders   DG [facility] DEXA   Anxiety with depression    Patient improved, symptoms well controlled on current medication dose.       Low back pain    Symptoms well managed on current medication.      Other Visit Diagnoses     Abnormal TSH       Relevant Orders   CBC with Differential/Platelet   CMP14+EGFR       Outpatient Encounter Medications as of 12/03/2020  Medication Sig   amitriptyline (ELAVIL) 10 MG tablet Take 1 tablet (10 mg total) by mouth at bedtime.   apixaban (ELIQUIS) 5 MG TABS tablet Take 1 tablet (5 mg total) by mouth 2 (two) times daily.   Ascorbic Acid (VITAMIN C)  500 MG tablet Take 500 mg by mouth daily.     bimatoprost (LUMIGAN) 0.01 % SOLN Place 1 drop into both eyes at bedtime.   Calcium Carbonate Antacid (TUMS PO) Take 2 tablets by mouth 4 (four) times daily as needed (for acid reflux/indigestion).    Cholecalciferol (VITAMIN D) 400 UNITS capsule Take 400 Units by mouth daily.     ciclopirox (PENLAC) 8 % solution Apply topically.   clopidogrel (PLAVIX) 75 MG tablet Take 1 tablet (75 mg total) by mouth  daily with breakfast.   Cyanocobalamin (VITAMIN B 12 PO) Take 1 tablet by mouth daily.   diclofenac Sodium (VOLTAREN) 1 % GEL Apply 2 g topically 4 (four) times daily.   diltiazem (CARDIZEM CD) 240 MG 24 hr capsule Take 1 capsule (240 mg total) by mouth daily.   Garlic 062 MG TABS Take 1 tablet by mouth daily.   HYDROcodone-acetaminophen (NORCO) 5-325 MG tablet Take 1 tablet by mouth every 6 (six) hours as needed for moderate pain.   levothyroxine (SYNTHROID) 75 MCG tablet Take 1 tablet (75 mcg total) by mouth daily before breakfast.   losartan (COZAAR) 50 MG tablet Take 1 tablet (50 mg total) by mouth daily.   MAGNESIUM CARBONATE PO Take 1 tablet by mouth daily. 400 mg   metFORMIN (GLUCOPHAGE-XR) 500 MG 24 hr tablet Take 1 tablet by mouth daily.    metoprolol tartrate (LOPRESSOR) 100 MG tablet TAKE ONE TABLET BY MOUTH TWICE DAILY   nitroGLYCERIN (NITROSTAT) 0.4 MG SL tablet Place 0.4 mg under the tongue every 5 (five) minutes as needed for chest pain.   promethazine (PHENERGAN) 25 MG tablet 1 tablet as needed.   rosuvastatin (CRESTOR) 20 MG tablet Take 1 tablet (20 mg total) by mouth daily.   timolol (TIMOPTIC) 0.5 % ophthalmic solution Place 1 drop into both eyes daily.   traZODone (DESYREL) 100 MG tablet Take 1 tablet (100 mg total) by mouth at bedtime.   TURMERIC PO Take 1 tablet by mouth daily.   VENTOLIN HFA 108 (90 Base) MCG/ACT inhaler Inhale 1-2 puffs into the lungs every 4 (four) hours as needed for shortness of breath.   No facility-administered encounter medications on file as of 12/03/2020.    Follow-up: Return in about 3 months (around 03/05/2021).   Ivy Lynn, NP

## 2020-12-03 NOTE — Assessment & Plan Note (Signed)
Controlled on current medication , no changes to dose. Continue on low cholesterol diet. And follow up in 3 months.

## 2020-12-03 NOTE — Assessment & Plan Note (Signed)
Completed A1c, foot exam, education provided to continue diabetic diet, take medication as prescribed and follow up in 3 months.

## 2020-12-04 LAB — CMP14+EGFR
ALT: 16 IU/L (ref 0–32)
AST: 14 IU/L (ref 0–40)
Albumin/Globulin Ratio: 2.4 — ABNORMAL HIGH (ref 1.2–2.2)
Albumin: 4.6 g/dL (ref 3.7–4.7)
Alkaline Phosphatase: 105 IU/L (ref 44–121)
BUN/Creatinine Ratio: 17 (ref 12–28)
BUN: 13 mg/dL (ref 8–27)
Bilirubin Total: 0.2 mg/dL (ref 0.0–1.2)
CO2: 29 mmol/L (ref 20–29)
Calcium: 9.8 mg/dL (ref 8.7–10.3)
Chloride: 98 mmol/L (ref 96–106)
Creatinine, Ser: 0.75 mg/dL (ref 0.57–1.00)
Globulin, Total: 1.9 g/dL (ref 1.5–4.5)
Glucose: 129 mg/dL — ABNORMAL HIGH (ref 70–99)
Potassium: 4.2 mmol/L (ref 3.5–5.2)
Sodium: 139 mmol/L (ref 134–144)
Total Protein: 6.5 g/dL (ref 6.0–8.5)
eGFR: 80 mL/min/{1.73_m2} (ref >59)

## 2020-12-04 LAB — CBC WITH DIFFERENTIAL/PLATELET
Basophils Absolute: 0 10*3/uL (ref 0.0–0.2)
Basos: 0 %
EOS (ABSOLUTE): 0.3 10*3/uL (ref 0.0–0.4)
Eos: 2 %
Hematocrit: 39.9 % (ref 34.0–46.6)
Hemoglobin: 13.4 g/dL (ref 11.1–15.9)
Immature Grans (Abs): 0 10*3/uL (ref 0.0–0.1)
Immature Granulocytes: 0 %
Lymphocytes Absolute: 3.3 10*3/uL — ABNORMAL HIGH (ref 0.7–3.1)
Lymphs: 31 %
MCH: 28.9 pg (ref 26.6–33.0)
MCHC: 33.6 g/dL (ref 31.5–35.7)
MCV: 86 fL (ref 79–97)
Monocytes Absolute: 0.8 10*3/uL (ref 0.1–0.9)
Monocytes: 7 %
Neutrophils Absolute: 6.4 10*3/uL (ref 1.4–7.0)
Neutrophils: 60 %
Platelets: 228 10*3/uL (ref 150–450)
RBC: 4.64 x10E6/uL (ref 3.77–5.28)
RDW: 12.8 % (ref 11.7–15.4)
WBC: 10.8 10*3/uL (ref 3.4–10.8)

## 2020-12-06 NOTE — Telephone Encounter (Signed)
Daughter aware.

## 2020-12-23 LAB — CUP PACEART REMOTE DEVICE CHECK
Date Time Interrogation Session: 20221102204315
Implantable Pulse Generator Implant Date: 20220210

## 2020-12-27 ENCOUNTER — Ambulatory Visit (INDEPENDENT_AMBULATORY_CARE_PROVIDER_SITE_OTHER): Payer: Medicare Other

## 2020-12-27 DIAGNOSIS — I4819 Other persistent atrial fibrillation: Secondary | ICD-10-CM

## 2020-12-31 ENCOUNTER — Ambulatory Visit: Payer: Medicare Other | Admitting: Cardiology

## 2020-12-31 ENCOUNTER — Encounter: Payer: Self-pay | Admitting: Cardiology

## 2020-12-31 ENCOUNTER — Other Ambulatory Visit: Payer: Self-pay

## 2020-12-31 VITALS — BP 122/60 | HR 68 | Ht 64.0 in | Wt 164.8 lb

## 2020-12-31 DIAGNOSIS — I10 Essential (primary) hypertension: Secondary | ICD-10-CM

## 2020-12-31 DIAGNOSIS — I25119 Atherosclerotic heart disease of native coronary artery with unspecified angina pectoris: Secondary | ICD-10-CM | POA: Diagnosis not present

## 2020-12-31 DIAGNOSIS — E782 Mixed hyperlipidemia: Secondary | ICD-10-CM

## 2020-12-31 DIAGNOSIS — I4819 Other persistent atrial fibrillation: Secondary | ICD-10-CM

## 2020-12-31 NOTE — Progress Notes (Signed)
Cardiology Office Note  Date: 12/31/2020   ID: [name], DOB June 13, 1940, MRN [id number]  PCP:  Ivy Lynn, NP  Cardiologist:  Rozann Lesches, MD Electrophysiologist:  Thompson Grayer, MD   Chief Complaint  Patient presents with   Cardiac follow-up    History of Present Illness: [name] is an 80 y.o. female last seen in May.  She has had interval follow-up in the atrial fibrillation clinic and also with Dr. Rayann Heman.  She is here today with her daughter.  No significant palpitations or change in status with ADLs.  I reviewed her medications which are noted below.  She does not report any obvious intolerances and states that she has been compliant with therapy.  We did discuss stopping Plavix at the end of January 2023 which would be 1 year out from her DES intervention.  She will otherwise continue Eliquis for stroke prophylaxis with atrial fibrillation.  I reviewed her recent lab work from October as noted below.  She has an ILR in place with followed by Dr. Rayann Heman.  Most recent interrogation showed 5.8% AF burden down from 26%.  Past Medical History:  Diagnosis Date   Anxiety    Arthritis    Bulging lumbar disc    Chronic lower back pain    Coronary atherosclerosis of native coronary artery    DES LAD 02/2008, DES LAD 02/2015, DES ramus and DES x2 mid RCA 02/2020   Depression    Dyslipidemia    Essential hypertension    Facial numbness    Frequent headaches    GERD (gastroesophageal reflux disease)    Glaucoma    Heart attack ([place])    Insomnia    Paroxysmal atrial fibrillation [facility])    Diagnosed October 2019   PSVT (paroxysmal supraventricular tachycardia) ([place])    Refusal of blood transfusions as patient is Jehovah's Witness    Thyroid disease    Type 2 diabetes mellitus ([place])    Borderline   Vitamin D deficiency     Past Surgical History:  Procedure Laterality Date   CARDIAC CATHETERIZATION N/A 02/23/2015   Procedure: Left Heart Cath and Coronary  Angiography;  Surgeon: Sherren Mocha, MD;  Location: [facility] [facility];  Service: Cardiovascular;  Laterality: N/A;   CARDIAC CATHETERIZATION N/A 02/23/2015   Procedure: Coronary Stent Intervention;  Surgeon: Sherren Mocha, MD;  Location: [place] [facility];  Service: Cardiovascular;  Laterality: N/A;   CARDIAC CATHETERIZATION N/A 01/04/2016   Procedure: Left Heart Cath and Coronary Angiography;  Surgeon: Peter M Martinique, MD;  Location: [facility] [facility];  Service: Cardiovascular;  Laterality: N/A;   CATARACT EXTRACTION     CORONARY ANGIOPLASTY  02/23/2015   CORONARY ANGIOPLASTY WITH STENT PLACEMENT  2009   CORONARY STENT INTERVENTION N/A 03/09/2020   Procedure: CORONARY STENT INTERVENTION;  Surgeon: Jettie Booze, MD;  Location: [place] [facility];  Service: Cardiovascular;  Laterality: N/A;   DILATION AND CURETTAGE OF UTERUS     implantable loop recorder placement  04/01/2020   Medtronic Reveal Linq model LNQ 22 [id number]) implantable loop recorder    LEFT HEART CATH AND CORONARY ANGIOGRAPHY N/A 03/09/2020   Procedure: LEFT HEART CATH AND CORONARY ANGIOGRAPHY;  Surgeon: Jettie Booze, MD;  Location: [facility] [facility];  Service: Cardiovascular;  Laterality: N/A;   TUBAL LIGATION      Current Outpatient Medications  Medication Sig Dispense Refill   amitriptyline (ELAVIL) 10 MG tablet Take 1 tablet (10 mg  total) by mouth at bedtime. 60 tablet 2   apixaban (ELIQUIS) 5 MG TABS tablet Take 1 tablet (5 mg total) by mouth 2 (two) times daily. 180 tablet 3   Ascorbic Acid (VITAMIN C) 500 MG tablet Take 500 mg by mouth daily.       bimatoprost (LUMIGAN) 0.01 % SOLN Place 1 drop into both eyes at bedtime.     Calcium Carbonate Antacid (TUMS PO) Take 2 tablets by mouth 4 (four) times daily as needed (for acid reflux/indigestion).      Cholecalciferol (VITAMIN D) 400 UNITS capsule Take 400 Units by mouth daily.       ciclopirox (PENLAC) 8 % solution Apply topically.     clopidogrel  (PLAVIX) 75 MG tablet Take 1 tablet (75 mg total) by mouth daily with breakfast. 90 tablet 3   Cyanocobalamin (VITAMIN B 12 PO) Take 1 tablet by mouth daily.     diclofenac Sodium (VOLTAREN) 1 % GEL Apply 2 g topically 4 (four) times daily. 50 g 2   diltiazem (CARDIZEM CD) 240 MG 24 hr capsule Take 1 capsule (240 mg total) by mouth daily. 90 capsule 3   Garlic 683 MG TABS Take 1 tablet by mouth daily.     HYDROcodone-acetaminophen (NORCO) 5-325 MG tablet Take 1 tablet by mouth every 6 (six) hours as needed for moderate pain. 120 tablet 0   levothyroxine (SYNTHROID) 75 MCG tablet Take 1 tablet (75 mcg total) by mouth daily before breakfast. 30 tablet 3   losartan (COZAAR) 50 MG tablet Take 1 tablet (50 mg total) by mouth daily. 90 tablet 3   MAGNESIUM CARBONATE PO Take 1 tablet by mouth daily. 400 mg     metFORMIN (GLUCOPHAGE-XR) 500 MG 24 hr tablet Take 1 tablet by mouth daily.      metoprolol tartrate (LOPRESSOR) 100 MG tablet TAKE ONE TABLET BY MOUTH TWICE DAILY 180 tablet 3   nitroGLYCERIN (NITROSTAT) 0.4 MG SL tablet Place 0.4 mg under the tongue every 5 (five) minutes as needed for chest pain.     promethazine (PHENERGAN) 25 MG tablet 1 tablet as needed.     rosuvastatin (CRESTOR) 20 MG tablet Take 1 tablet (20 mg total) by mouth daily. 90 tablet 3   timolol (TIMOPTIC) 0.5 % ophthalmic solution Place 1 drop into both eyes daily.     traZODone (DESYREL) 100 MG tablet Take 1 tablet (100 mg total) by mouth at bedtime. 90 tablet 1   TURMERIC PO Take 1 tablet by mouth daily.     VENTOLIN HFA 108 (90 Base) MCG/ACT inhaler Inhale 1-2 puffs into the lungs every 4 (four) hours as needed for shortness of breath.     No current facility-administered medications for this visit.   Allergies:  Patient has no known allergies.   ROS: No orthopnea or PND.  Physical Exam: VS:  BP 122/60   Pulse 68   Ht 5\' 4"  (1.626 m)   Wt 164 lb 12.8 oz (74.8 kg)   LMP  (LMP Unknown)   SpO2 97%   BMI 28.29 kg/m  , BMI Body mass index is 28.29 kg/m.  Wt Readings from Last 3 Encounters:  12/31/20 164 lb 12.8 oz (74.8 kg)  12/03/20 166 lb (75.3 kg)  08/27/20 169 lb (76.7 kg)    General: Patient appears comfortable at rest. HEENT: Conjunctiva and lids normal, wearing a mask. Neck: Supple, no elevated JVP or carotid bruits, no thyromegaly. Lungs: Clear to auscultation, nonlabored breathing at rest. Cardiac: Irregularly  irregular, no S3, 1/6 systolic murmur, no pericardial rub. Extremities: No pitting edema.  ECG:  An ECG dated 07/15/2020 was personally reviewed today and demonstrated:  Sinus rhythm with PVC, nonspecific ST changes.  Recent Labwork: 03/11/2020: Magnesium 1.9 08/27/2020: TSH 0.552 12/03/2020: ALT 16; AST 14; BUN 13; Creatinine, Ser 0.75; Hemoglobin 13.4; Platelets 228; Potassium 4.2; Sodium 139     Component Value Date/Time   CHOL 109 08/27/2020 1518   TRIG 155 (H) 08/27/2020 1518   HDL 46 08/27/2020 1518   CHOLHDL 2.4 08/27/2020 1518   CHOLHDL 2.4 03/09/2020 0209   VLDL 12 03/09/2020 0209   LDLCALC 37 08/27/2020 1518    Other Studies Reviewed Today:  Echocardiogram 03/10/2020:  1. Anterior and basal to mid anterolateral hypokinesis. Posterolateral  hypokinesis. Left ventricular ejection fraction, by estimation, is 55 to  60%. The left ventricle has normal function. The left ventricle  demonstrates regional wall motion  abnormalities (see scoring diagram/findings for description). There is  mild concentric left ventricular hypertrophy. Left ventricular diastolic  parameters are consistent with Grade II diastolic dysfunction  (pseudonormalization). Elevated left ventricular   end-diastolic pressure.   2. Right ventricular systolic function is normal. The right ventricular  size is normal. There is moderately elevated pulmonary artery systolic  pressure.   3. Right atrial size was mildly dilated.   4. The mitral valve is normal in structure. Mild mitral valve   regurgitation. No evidence of mitral stenosis.   5. Tricuspid valve regurgitation is moderate to severe.   6. The aortic valve is normal in structure. There is mild calcification  of the aortic valve. There is mild thickening of the aortic valve. Aortic  valve regurgitation is moderate. No aortic stenosis is present. Aortic  regurgitation PHT measures 457 msec.   7. The inferior vena cava is normal in size with greater than 50%  respiratory variability, suggesting right atrial pressure of 3 mmHg.   Assessment and Plan:  1.  Paroxysmal to persistent atrial fibrillation with CHA2DS2-VASc score of 6.  She remains on Eliquis for stroke prophylaxis.  ILR interrogation by Dr. Rayann Heman most recently showed decrease in rhythm burden to approximately 6%.  Continue Cardizem CD and Lopressor.  2.  CAD status post DES to the LAD in 2010, DES to the LAD in 2017, and DES to the ramus intermedius as well as DES x2 to the RCA in January 2022.  She is doing well without active angina and will continue Plavix through January 2023 then discontinue.  Continue Crestor and as needed nitroglycerin.  3.  Mixed hyperlipidemia on Crestor, last LDL 37.  Medication Adjustments/Labs and Tests Ordered: Current medicines are reviewed at length with the patient today.  Concerns regarding medicines are outlined above.   Tests Ordered: No orders of the defined types were placed in this encounter.   Medication Changes: No orders of the defined types were placed in this encounter.   Disposition:  Follow up  6 months.  Signed, Satira Sark, MD, [facility] 12/31/2020 2:25 PM    [facility] [place], [place], [place] [address] Phone: [phone]; Fax: [phone]

## 2020-12-31 NOTE — Patient Instructions (Signed)
Medication Instructions:   On February 1 st, 2023, STOP Plavix  Labwork: None today   Testing/Procedures: None today   Follow-Up: 6 months with Dr.McDowell  Any Other Special Instructions Will Be Listed Below (If Applicable).  If you need a refill on your cardiac medications before your next appointment, please call your pharmacy.

## 2021-01-03 ENCOUNTER — Other Ambulatory Visit: Payer: Self-pay | Admitting: Nurse Practitioner

## 2021-01-03 ENCOUNTER — Other Ambulatory Visit: Payer: Self-pay | Admitting: Student

## 2021-01-04 NOTE — Progress Notes (Signed)
Carelink Summary Report / Loop Recorder 

## 2021-01-09 ENCOUNTER — Other Ambulatory Visit: Payer: Self-pay

## 2021-01-09 ENCOUNTER — Encounter (HOSPITAL_COMMUNITY): Payer: Self-pay | Admitting: *Deleted

## 2021-01-09 ENCOUNTER — Emergency Department (HOSPITAL_COMMUNITY): Payer: Medicare Other

## 2021-01-09 ENCOUNTER — Inpatient Hospital Stay (HOSPITAL_COMMUNITY)
Admission: EM | Admit: 2021-01-09 | Discharge: 2021-01-12 | DRG: 308 | Disposition: A | Discharge status: Home / Self Care | Payer: Medicare Other | Attending: Family Medicine | Admitting: Family Medicine

## 2021-01-09 DIAGNOSIS — I2510 Atherosclerotic heart disease of native coronary artery without angina pectoris: Secondary | ICD-10-CM | POA: Diagnosis not present

## 2021-01-09 DIAGNOSIS — R0602 Shortness of breath: Secondary | ICD-10-CM

## 2021-01-09 DIAGNOSIS — Z79890 Hormone replacement therapy (postmenopausal): Secondary | ICD-10-CM

## 2021-01-09 DIAGNOSIS — Z79891 Long term (current) use of opiate analgesic: Secondary | ICD-10-CM

## 2021-01-09 DIAGNOSIS — I5021 Acute systolic (congestive) heart failure: Secondary | ICD-10-CM | POA: Diagnosis not present

## 2021-01-09 DIAGNOSIS — Z8249 Family history of ischemic heart disease and other diseases of the circulatory system: Secondary | ICD-10-CM

## 2021-01-09 DIAGNOSIS — E559 Vitamin D deficiency, unspecified: Secondary | ICD-10-CM | POA: Diagnosis present

## 2021-01-09 DIAGNOSIS — E782 Mixed hyperlipidemia: Secondary | ICD-10-CM | POA: Diagnosis not present

## 2021-01-09 DIAGNOSIS — E039 Hypothyroidism, unspecified: Secondary | ICD-10-CM | POA: Diagnosis not present

## 2021-01-09 DIAGNOSIS — E119 Type 2 diabetes mellitus without complications: Secondary | ICD-10-CM | POA: Diagnosis not present

## 2021-01-09 DIAGNOSIS — Z79899 Other long term (current) drug therapy: Secondary | ICD-10-CM | POA: Diagnosis not present

## 2021-01-09 DIAGNOSIS — E1159 Type 2 diabetes mellitus with other circulatory complications: Secondary | ICD-10-CM | POA: Diagnosis present

## 2021-01-09 DIAGNOSIS — F32A Depression, unspecified: Secondary | ICD-10-CM | POA: Diagnosis not present

## 2021-01-09 DIAGNOSIS — M1990 Unspecified osteoarthritis, unspecified site: Secondary | ICD-10-CM | POA: Diagnosis present

## 2021-01-09 DIAGNOSIS — R079 Chest pain, unspecified: Secondary | ICD-10-CM | POA: Diagnosis not present

## 2021-01-09 DIAGNOSIS — Z20822 Contact with and (suspected) exposure to covid-19: Secondary | ICD-10-CM | POA: Diagnosis present

## 2021-01-09 DIAGNOSIS — I480 Paroxysmal atrial fibrillation: Secondary | ICD-10-CM | POA: Diagnosis not present

## 2021-01-09 DIAGNOSIS — I509 Heart failure, unspecified: Secondary | ICD-10-CM

## 2021-01-09 DIAGNOSIS — Z7902 Long term (current) use of antithrombotics/antiplatelets: Secondary | ICD-10-CM

## 2021-01-09 DIAGNOSIS — I10 Essential (primary) hypertension: Secondary | ICD-10-CM | POA: Diagnosis present

## 2021-01-09 DIAGNOSIS — K219 Gastro-esophageal reflux disease without esophagitis: Secondary | ICD-10-CM | POA: Diagnosis not present

## 2021-01-09 DIAGNOSIS — I5033 Acute on chronic diastolic (congestive) heart failure: Secondary | ICD-10-CM | POA: Diagnosis present

## 2021-01-09 DIAGNOSIS — Z955 Presence of coronary angioplasty implant and graft: Secondary | ICD-10-CM

## 2021-01-09 DIAGNOSIS — E1169 Type 2 diabetes mellitus with other specified complication: Secondary | ICD-10-CM | POA: Diagnosis present

## 2021-01-09 DIAGNOSIS — G4700 Insomnia, unspecified: Secondary | ICD-10-CM | POA: Diagnosis present

## 2021-01-09 DIAGNOSIS — I110 Hypertensive heart disease with heart failure: Secondary | ICD-10-CM | POA: Diagnosis not present

## 2021-01-09 DIAGNOSIS — G8929 Other chronic pain: Secondary | ICD-10-CM | POA: Diagnosis not present

## 2021-01-09 DIAGNOSIS — Z7901 Long term (current) use of anticoagulants: Secondary | ICD-10-CM | POA: Diagnosis not present

## 2021-01-09 DIAGNOSIS — I25119 Atherosclerotic heart disease of native coronary artery with unspecified angina pectoris: Secondary | ICD-10-CM | POA: Diagnosis present

## 2021-01-09 DIAGNOSIS — H409 Unspecified glaucoma: Secondary | ICD-10-CM | POA: Diagnosis not present

## 2021-01-09 DIAGNOSIS — I517 Cardiomegaly: Secondary | ICD-10-CM | POA: Diagnosis not present

## 2021-01-09 DIAGNOSIS — F419 Anxiety disorder, unspecified: Secondary | ICD-10-CM | POA: Diagnosis present

## 2021-01-09 DIAGNOSIS — R1110 Vomiting, unspecified: Secondary | ICD-10-CM | POA: Diagnosis not present

## 2021-01-09 DIAGNOSIS — I252 Old myocardial infarction: Secondary | ICD-10-CM | POA: Diagnosis not present

## 2021-01-09 DIAGNOSIS — E032 Hypothyroidism due to medicaments and other exogenous substances: Secondary | ICD-10-CM

## 2021-01-09 DIAGNOSIS — R059 Cough, unspecified: Secondary | ICD-10-CM | POA: Diagnosis not present

## 2021-01-09 DIAGNOSIS — I4891 Unspecified atrial fibrillation: Secondary | ICD-10-CM | POA: Diagnosis not present

## 2021-01-09 DIAGNOSIS — I152 Hypertension secondary to endocrine disorders: Secondary | ICD-10-CM | POA: Diagnosis present

## 2021-01-09 LAB — CBC WITH DIFFERENTIAL/PLATELET
Abs Immature Granulocytes: 0.06 10*3/uL (ref 0.00–0.07)
Basophils Absolute: 0 10*3/uL (ref 0.0–0.1)
Basophils Relative: 0 %
Eosinophils Absolute: 0.1 10*3/uL (ref 0.0–0.5)
Eosinophils Relative: 1 %
HCT: 36.2 % (ref 36.0–46.0)
Hemoglobin: 12.1 g/dL (ref 12.0–15.0)
Immature Granulocytes: 1 %
Lymphocytes Relative: 23 %
Lymphs Abs: 1.8 10*3/uL (ref 0.7–4.0)
MCH: 30.3 pg (ref 26.0–34.0)
MCHC: 33.4 g/dL (ref 30.0–36.0)
MCV: 90.7 fL (ref 80.0–100.0)
Monocytes Absolute: 0.5 10*3/uL (ref 0.1–1.0)
Monocytes Relative: 6 %
Neutro Abs: 5.5 10*3/uL (ref 1.7–7.7)
Neutrophils Relative %: 69 %
Platelets: 194 10*3/uL (ref 150–400)
RBC: 3.99 MIL/uL (ref 3.87–5.11)
RDW: 13 % (ref 11.5–15.5)
WBC: 7.9 10*3/uL (ref 4.0–10.5)
nRBC: 0 % (ref 0.0–0.2)

## 2021-01-09 LAB — COMPREHENSIVE METABOLIC PANEL
ALT: 74 U/L — ABNORMAL HIGH (ref 0–44)
AST: 29 U/L (ref 15–41)
Albumin: 4 g/dL (ref 3.5–5.0)
Alkaline Phosphatase: 90 U/L (ref 38–126)
Anion gap: 9 (ref 5–15)
BUN: 15 mg/dL (ref 8–23)
CO2: 24 mmol/L (ref 22–32)
Calcium: 9.4 mg/dL (ref 8.9–10.3)
Chloride: 102 mmol/L (ref 98–111)
Creatinine, Ser: 0.94 mg/dL (ref 0.44–1.00)
GFR, Estimated: >60 mL/min (ref >60)
Glucose, Bld: 252 mg/dL — ABNORMAL HIGH (ref 70–99)
Potassium: 4 mmol/L (ref 3.5–5.1)
Sodium: 135 mmol/L (ref 135–145)
Total Bilirubin: 0.3 mg/dL (ref 0.3–1.2)
Total Protein: 6.6 g/dL (ref 6.5–8.1)

## 2021-01-09 LAB — TROPONIN I (HIGH SENSITIVITY)
Troponin I (High Sensitivity): 5 ng/L (ref <18)
Troponin I (High Sensitivity): 5 ng/L (ref <18)

## 2021-01-09 LAB — URINALYSIS, ROUTINE W REFLEX MICROSCOPIC
Bilirubin Urine: NEGATIVE
Glucose, UA: NEGATIVE mg/dL
Hgb urine dipstick: NEGATIVE
Ketones, ur: NEGATIVE mg/dL
Leukocytes,Ua: NEGATIVE
Nitrite: NEGATIVE
Protein, ur: NEGATIVE mg/dL
Specific Gravity, Urine: 1.005 (ref 1.005–1.030)
pH: 6 (ref 5.0–8.0)

## 2021-01-09 LAB — RESP PANEL BY RT-PCR (FLU A&B, COVID) ARPGX2
Influenza A by PCR: NEGATIVE
Influenza B by PCR: NEGATIVE
SARS Coronavirus 2 by RT PCR: NEGATIVE

## 2021-01-09 LAB — BRAIN NATRIURETIC PEPTIDE: B Natriuretic Peptide: 1296 pg/mL — ABNORMAL HIGH (ref 0.0–100.0)

## 2021-01-09 MED ORDER — DILTIAZEM HCL 25 MG/5ML IV SOLN
10.0000 mg | Freq: Once | INTRAVENOUS | Status: AC
  Administered 2021-01-09: 10 mg via INTRAVENOUS
  Filled 2021-01-09: qty 5

## 2021-01-09 MED ORDER — SODIUM CHLORIDE 0.9 % IV BOLUS
500.0000 mL | Freq: Once | INTRAVENOUS | Status: AC
  Administered 2021-01-09: 500 mL via INTRAVENOUS

## 2021-01-09 MED ORDER — DILTIAZEM HCL-DEXTROSE 125-5 MG/125ML-% IV SOLN (PREMIX)
5.0000 mg/h | INTRAVENOUS | Status: DC
  Administered 2021-01-09: 5 mg/h via INTRAVENOUS
  Administered 2021-01-10: 15 mg/h via INTRAVENOUS
  Administered 2021-01-10: 12.5 mg/h via INTRAVENOUS
  Administered 2021-01-10: 15 mg/h via INTRAVENOUS
  Administered 2021-01-11: 10 mg/h via INTRAVENOUS
  Administered 2021-01-11: 15 mg/h via INTRAVENOUS
  Filled 2021-01-09 (×6): qty 125

## 2021-01-09 MED ORDER — FUROSEMIDE 10 MG/ML IJ SOLN
40.0000 mg | Freq: Once | INTRAMUSCULAR | Status: AC
  Administered 2021-01-09: 40 mg via INTRAVENOUS
  Filled 2021-01-09: qty 4

## 2021-01-09 NOTE — ED Triage Notes (Signed)
Pt c/o SOB, worse with exertion, chest pain, cough, vomiting since Thursday.

## 2021-01-09 NOTE — ED Provider Notes (Signed)
[facility] EMERGENCY DEPARTMENT Provider Note   CSN: [id number] Arrival date & time: 01/09/21  1320     History Chief Complaint  Patient presents with   Shortness of Breath    [name] is a 80 y.o. female.  With past medical history of hypertension, paroxysmal atrial fibrillation anticoagulated on Eliquis, NSTEMI in 02/2020 with multiple stent placement anticoagulated on Plavix who presents emergency department with shortness of breath.  She states that beginning Wednesday she noticed increased work of breathing as she was moving around the house.  States that she is normally able to ambulate out to the mailbox or work in the yard however she was nervous to walk from room to room due to her shortness of breath.  She states that she is also had intermittent palpitations since Wednesday without lightheadedness, dizziness or syncope.  She denies chest pain.  She also notes that she has had cough, nausea and vomiting for the past 2 days without abdominal pain or diarrhea.  She denies any fevers.  Denies urinary symptoms.  Last cardiology appointment with Dr. Rayann Heman on 12/31/2020.  Patient has a implantable loop recorder for her atrial fibrillation.  Peers that she has had stent placement in 2010, 2017 and most recently in January 2022. Last echo 03/10/2020 with EF 55 to 60%.  Shortness of Breath Associated symptoms: cough and vomiting   Associated symptoms: no abdominal pain, no chest pain, no fever and no sore throat       Past Medical History:  Diagnosis Date   Anxiety    Arthritis    Bulging lumbar disc    Chronic lower back pain    Coronary atherosclerosis of native coronary artery    DES LAD 02/2008, DES LAD 02/2015, DES ramus and DES x2 mid RCA 02/2020   Depression    Dyslipidemia    Essential hypertension    Facial numbness    Frequent headaches    GERD (gastroesophageal reflux disease)    Glaucoma    Heart attack ([place])    Insomnia    Paroxysmal atrial fibrillation  ([place])    Diagnosed October 2019   PSVT (paroxysmal supraventricular tachycardia) (HCC)    Refusal of blood transfusions as patient is Jehovah's Witness    Thyroid disease    Type 2 diabetes mellitus ([place])    Borderline   Vitamin D deficiency     Patient Active Problem List   Diagnosis Date Noted   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   Persistent atrial fibrillation ([place]) 07/15/2020   Low back pain 03/17/2020   Non-ST elevation (NSTEMI) myocardial infarction [facility])    Unstable angina ([place]) 03/08/2020   Hypothyroidism 02/06/2020   Paroxysmal atrial fibrillation ([place]) 12/16/2019   Secondary hypercoagulable state ([place]) 12/16/2019   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   Healthcare maintenance 10/02/2019   Anxiety with depression 10/02/2019   Numbness 04/02/2018   Numbness of tongue 04/02/2018   Neck pain on right side 04/02/2018   Diabetes mellitus ([place]) 12/21/2017   Atrial fibrillation with rapid ventricular response (HCC)    Chest pain in adult    Atrial fibrillation with RVR ([place]) 12/17/2017   Accelerating angina (HCC)    Exertional angina ([place]) 02/23/2015   Chest pain 11/01/2012   Mixed hyperlipidemia 11/12/2009   Essential hypertension 11/12/2009   Coronary atherosclerosis of native coronary artery 09/25/2008   Pain of back and left lower extremity 02/19/2008    Past Surgical History:  Procedure Laterality Date   CARDIAC CATHETERIZATION  N/A 02/23/2015   Procedure: Left Heart Cath and Coronary Angiography;  Surgeon: Sherren Mocha, MD;  Location: [place] [facility];  Service: Cardiovascular;  Laterality: N/A;   CARDIAC CATHETERIZATION N/A 02/23/2015   Procedure: Coronary Stent Intervention;  Surgeon: Sherren Mocha, MD;  Location: [place] [facility];  Service: Cardiovascular;  Laterality: N/A;   CARDIAC CATHETERIZATION N/A 01/04/2016   Procedure: Left Heart Cath and Coronary Angiography;  Surgeon: Peter M Martinique, MD;  Location: [place] [facility];  Service:  Cardiovascular;  Laterality: N/A;   CATARACT EXTRACTION     CORONARY ANGIOPLASTY  02/23/2015   CORONARY ANGIOPLASTY WITH STENT PLACEMENT  2009   CORONARY STENT INTERVENTION N/A 03/09/2020   Procedure: CORONARY STENT INTERVENTION;  Surgeon: Jettie Booze, MD;  Location: [place] [facility];  Service: Cardiovascular;  Laterality: N/A;   DILATION AND CURETTAGE OF UTERUS     implantable loop recorder placement  04/01/2020   Medtronic Reveal Linq model LNQ 22 [id number]) implantable loop recorder    LEFT HEART CATH AND CORONARY ANGIOGRAPHY N/A 03/09/2020   Procedure: LEFT HEART CATH AND CORONARY ANGIOGRAPHY;  Surgeon: Jettie Booze, MD;  Location: [place] [facility];  Service: Cardiovascular;  Laterality: N/A;   TUBAL LIGATION       OB History   No obstetric history on file.     Family History  Problem Relation Age of Onset   Other Mother        "natural causes"   Heart disease Mother    Cancer Father        unsure of type    Social History   Tobacco Use   Smoking status: Never   Smokeless tobacco: Never  Vaping Use   Vaping Use: Never used  Substance Use Topics   Alcohol use: Yes    Alcohol/week: 1.0 standard drink    Types: 1 Glasses of wine per week    Comment: RARE OCCASION   Drug use: No    Home Medications Prior to Admission medications   Medication Sig Start Date End Date Taking? Authorizing Provider  apixaban (ELIQUIS) 5 MG TABS tablet Take 1 tablet (5 mg total) by mouth 2 (two) times daily. 09/15/20   Satira Sark, MD  Ascorbic Acid (VITAMIN C) 500 MG tablet Take 500 mg by mouth daily.      [provider]  bimatoprost (LUMIGAN) 0.01 % SOLN Place 1 drop into both eyes at bedtime.    [provider]  Calcium Carbonate Antacid (TUMS PO) Take 2 tablets by mouth 4 (four) times daily as needed (for acid reflux/indigestion).     [provider]  Cholecalciferol (VITAMIN D) 400 UNITS capsule Take 400 Units by mouth daily.       [provider]  ciclopirox (PENLAC) 8 % solution Apply topically. 06/24/20   [provider]  clopidogrel (PLAVIX) 75 MG tablet Take 1 tablet (75 mg total) by mouth daily with breakfast. 04/14/20   Ahmed Prima, Fransisco Hertz, PA-C  Cyanocobalamin (VITAMIN B 12 PO) Take 1 tablet by mouth daily.    [provider]  diclofenac Sodium (VOLTAREN) 1 % GEL Apply 2 g topically 4 (four) times daily. 01/09/20   Ivy Lynn, NP  diltiazem (CARDIZEM CD) 240 MG 24 hr capsule Take 1 capsule (240 mg total) by mouth daily. 04/14/20   Strader, Fransisco Hertz, PA-C  Garlic 517 MG TABS Take 1 tablet by mouth daily.    [provider]  HYDROcodone-acetaminophen [facility]) [phone]  MG tablet Take 1 tablet by mouth every 6 (six) hours as needed for moderate pain. 09/14/20   Ivy Lynn, NP  levothyroxine (SYNTHROID) 75 MCG tablet Take 1 tablet (75 mcg total) by mouth daily before breakfast. 08/12/20   Brita Romp, NP  losartan (COZAAR) 50 MG tablet Take 1 tablet (50 mg total) by mouth daily. 04/14/20   Strader, Fransisco Hertz, PA-C  MAGNESIUM CARBONATE PO Take 1 tablet by mouth daily. 400 mg    [provider]  metFORMIN (GLUCOPHAGE-XR) 500 MG 24 hr tablet Take 1 tablet by mouth daily.  11/22/18   [provider]  metoprolol tartrate (LOPRESSOR) 100 MG tablet TAKE ONE TABLET BY MOUTH TWICE DAILY 05/17/20   Satira Sark, MD  nitroGLYCERIN (NITROSTAT) 0.4 MG SL tablet Place 0.4 mg under the tongue every 5 (five) minutes as needed for chest pain.    [provider]  promethazine (PHENERGAN) 25 MG tablet 1 tablet as needed. 08/08/18   [provider]  rosuvastatin (CRESTOR) 20 MG tablet TAKE ONE (1) TABLET BY MOUTH EVERY DAY 01/03/21   Strader, [place] M, PA-C  timolol (TIMOPTIC) 0.5 % ophthalmic solution Place 1 drop into both eyes daily.    [provider]  traZODone (DESYREL) 100 MG tablet TAKE ONE TABLET BY MOUTH AT BEDTIME 01/03/21   Ivy Lynn, NP  TURMERIC PO Take 1 tablet by mouth daily.    [provider]  VENTOLIN HFA 108 (90 Base) MCG/ACT inhaler Inhale 1-2 puffs into the lungs every 4 (four) hours as needed for shortness of breath. 12/21/15   [provider]    Allergies    Patient has no known allergies.  Review of Systems   Review of Systems  Constitutional:  Positive for appetite change. Negative for fever.  HENT:  Negative for sore throat.   Respiratory:  Positive for cough and shortness of breath.   Cardiovascular:  Positive for palpitations. Negative for chest pain and leg swelling.  Gastrointestinal:  Positive for nausea and vomiting. Negative for abdominal pain.  Genitourinary:  Negative for dysuria.  Neurological:  Negative for dizziness, syncope and light-headedness.  All other systems reviewed and are negative.  Physical Exam Updated Vital Signs BP (!) 138/93 (BP Location: Right Arm)   Pulse (!) 127   Temp 98.2 F (36.8 C) (Oral)   Resp 16   Ht 5\' 4"  (1.626 m)   Wt 74.4 kg   LMP  (LMP Unknown)   SpO2 99%   BMI 28.15 kg/m   Physical Exam Vitals and nursing note reviewed.  Constitutional:      General: She is not in acute distress.    Appearance: Normal appearance. She is well-developed.  HENT:     Head: Normocephalic and atraumatic.     Mouth/Throat:     Mouth: Mucous membranes are moist.     Pharynx: Oropharynx is clear. No posterior oropharyngeal erythema.  Eyes:     General: No scleral icterus.    Extraocular Movements: Extraocular movements intact.     Pupils: Pupils are equal, round, and reactive to light.  Cardiovascular:     Rate and Rhythm: Tachycardia present. Rhythm irregularly irregular.     Pulses:          Radial pulses are 1+ on the right side and 2+ on the left side.       Dorsalis pedis pulses are 1+ on the right side and 1+ on the left side.  Heart sounds: No murmur heard. Pulmonary:     Effort: Pulmonary effort is normal. No respiratory  distress.     Breath sounds: Normal breath sounds.  Chest:     Chest wall: No tenderness.  Abdominal:     General: Bowel sounds are normal.     Palpations: Abdomen is soft.     Tenderness: There is no abdominal tenderness.  Musculoskeletal:        General: Normal range of motion.     Cervical back: Normal range of motion and neck supple.     Right lower leg: No edema.     Left lower leg: No edema.  Skin:    General: Skin is warm and dry.     Capillary Refill: Capillary refill takes less than 2 seconds.  Neurological:     General: No focal deficit present.     Mental Status: She is alert and oriented to person, place, and time. Mental status is at baseline.  Psychiatric:        Mood and Affect: Mood normal.        Behavior: Behavior normal.    ED Results / Procedures / Treatments   Labs (all labs ordered are listed, but only abnormal results are displayed) Labs Reviewed  COMPREHENSIVE METABOLIC PANEL - Abnormal; Notable for the following components:      Result Value   Glucose, Bld 252 (*)    ALT 74 (*)    All other components within normal limits  BRAIN NATRIURETIC PEPTIDE - Abnormal; Notable for the following components:   B Natriuretic Peptide 1,296.0 (*)    All other components within normal limits  RESP PANEL BY RT-PCR (FLU A&B, COVID) ARPGX2  CBC WITH DIFFERENTIAL/PLATELET  URINALYSIS, ROUTINE W REFLEX MICROSCOPIC  TROPONIN I (HIGH SENSITIVITY)  TROPONIN I (HIGH SENSITIVITY)   EKG EKG Interpretation  Date/Time:  Sunday January 09 2021 16:53:12 EST Ventricular Rate:  136 PR Interval:    QRS Duration: 79 QT Interval:  307 QTC Calculation: 462 R Axis:   77 Text Interpretation: Atrial fibrillation Borderline repolarization abnormality Baseline wander in lead(s) V4 Confirmed by Milton Ferguson [phone]) on 01/09/2021 4:57:51 PM  Radiology DG Chest Port 1 View  Result Date: 01/09/2021 CLINICAL DATA:  Shortness of breath with exertion with chest pain, cough and  vomiting since Thursday. EXAM: PORTABLE CHEST 1 VIEW COMPARISON:  03/08/2020 FINDINGS: Loop recorder projects over the left chest. Lungs are adequately inflated without focal airspace consolidation or effusion. Coronary artery stent unchanged projecting over the left heart. Mild stable cardiomegaly. Remainder of the exam is unchanged. IMPRESSION: No active disease. Electronically Signed   By: Marin Olp M.D.   On: 01/09/2021 14:47    Procedures .Critical Care Performed by: Mickie Hillier, PA-C Authorized by: Mickie Hillier, PA-C   Critical care provider statement:    Critical care time (minutes):  40   Critical care time was exclusive of:  Separately billable procedures and treating other patients   Critical care was necessary to treat or prevent imminent or life-threatening deterioration of the following conditions:  Cardiac failure   Critical care was time spent personally by me on the following activities:  Development of treatment plan with patient or surrogate, discussions with consultants, discussions with primary provider, evaluation of patient's response to treatment, examination of patient, interpretation of cardiac output measurements, obtaining history from patient or surrogate, review of old charts, re-evaluation of patient's condition, pulse oximetry, ordering and review of radiographic studies, ordering and review  of laboratory studies and ordering and performing treatments and interventions   I assumed direction of critical care for this patient from another provider in my specialty: no     Care discussed with: admitting provider     Medications Ordered in ED Medications  furosemide (LASIX) injection 40 mg (has no administration in time range)  diltiazem (CARDIZEM) 125 mg in dextrose 5% 125 mL (1 mg/mL) infusion (has no administration in time range)  diltiazem (CARDIZEM) injection 10 mg (10 mg Intravenous Given 01/09/21 1412)  sodium chloride 0.9 % bolus 500 mL (0 mLs  Intravenous Stopped 01/09/21 1544)   ED Course  I have reviewed the triage vital signs and the nursing notes.  Pertinent labs & imaging results that were available during my care of the patient were reviewed by me and considered in my medical decision making (see chart for details).    MDM Rules/Calculators/A&P 80 year old female who presents emergency department with shortness of breath.  Shortness of breath likely secondary to her atrial fibrillation and congestive heart failure. EKG showing A. fib with RVR without ST elevation or ischemic changes.  X2 troponin negative.  Doubt ACS as a component of her shortness of breath. Considered but doubt acute PE as her presentation is not consistent. Chest x-ray without pneumothorax Given diltiazem 10 mg which brought her heart rate down to 80, rate controlled A. fib.  However ambulated to evaluate for heart rate on reevaluation.  Heart rate back up to 120s to 130s and she is still symptomatically short of breath.  No hypoxia on ambulation. Her BNP is 1296.  There is no previous BNP drawn that I can see on her chart review.  However she is symptomatic so we will treat for acute heart failure. Poke with Dr. Sheran Lawless, hospitalist who agrees to admit the patient.  He requests Cardizem drip and 40 mg IV Lasix now.  She will be admitted to stepdown. Final Clinical Impression(s) / ED Diagnoses Final diagnoses:  SOB (shortness of breath)    Rx / DC Orders ED Discharge Orders     None        Mickie Hillier, PA-C 01/09/21 1740    Horton, Alvin Critchley, DO 01/10/21 [phone]

## 2021-01-09 NOTE — H&P (Signed)
History and Physical  [name] HQP:[id number] DOB: [birth year] DOA: 01/09/2021  Referring physician: Theodis Blaze, PA-C, EDP PCP: Ivy Lynn, NP  Outpatient Specialists:   Patient Coming From: home  Chief Complaint: SOB  HPI: [name] is a 80 y.o. female with a history of coronary artery disease with multiple stents, hypertension, atrial fibrillation on chronic anticoagulation, type 2 diabetes, hypothyroidism, GERD.  Patient seen for increasing shortness of breath that is worse with ambulation since Wednesday.  Her symptoms have been worsening.  Denies cough, fevers, wheezing.  She has noticed increased orthopnea and that her heart was beating faster than normal.  She has been compliant with her medications.  There have been no recent changes to her medications  Emergency Department Course: Patient noted to be in A. fib with RVR.  Patient given bolus of Cardizem which mildly improved her heart rate for a brief period of time, then the patient became tachycardic again.  BNP elevated to 1200.  CK slightly elevated, but troponins were normal.  Checks x-ray was unremarkable.  Upon my request, Cardizem drip started, Lasix 40 mg given.  Review of Systems:   Pt denies any fevers, chills, nausea, vomiting, diarrhea, constipation, abdominal pain, headache, vision changes, lightheadedness, dizziness, melena, rectal bleeding.  Review of systems are otherwise negative  Past Medical History:  Diagnosis Date   Anxiety    Arthritis    Bulging lumbar disc    Chronic lower back pain    Coronary atherosclerosis of native coronary artery    DES LAD 02/2008, DES LAD 02/2015, DES ramus and DES x2 mid RCA 02/2020   Depression    Dyslipidemia    Essential hypertension    Facial numbness    Frequent headaches    GERD (gastroesophageal reflux disease)    Glaucoma    Heart attack ([place])    Insomnia    Paroxysmal atrial fibrillation [facility])    Diagnosed October 2019   PSVT (paroxysmal  supraventricular tachycardia) ([place])    Refusal of blood transfusions as patient is Jehovah's Witness    Thyroid disease    Type 2 diabetes mellitus ([place])    Borderline   Vitamin D deficiency    Past Surgical History:  Procedure Laterality Date   CARDIAC CATHETERIZATION N/A 02/23/2015   Procedure: Left Heart Cath and Coronary Angiography;  Surgeon: Sherren Mocha, MD;  Location: [place] [facility];  Service: Cardiovascular;  Laterality: N/A;   CARDIAC CATHETERIZATION N/A 02/23/2015   Procedure: Coronary Stent Intervention;  Surgeon: Sherren Mocha, MD;  Location: [place] [facility];  Service: Cardiovascular;  Laterality: N/A;   CARDIAC CATHETERIZATION N/A 01/04/2016   Procedure: Left Heart Cath and Coronary Angiography;  Surgeon: Peter M Martinique, MD;  Location: [place] [facility];  Service: Cardiovascular;  Laterality: N/A;   CATARACT EXTRACTION     CORONARY ANGIOPLASTY  02/23/2015   CORONARY ANGIOPLASTY WITH STENT PLACEMENT  2009   CORONARY STENT INTERVENTION N/A 03/09/2020   Procedure: CORONARY STENT INTERVENTION;  Surgeon: Jettie Booze, MD;  Location: [place] [facility];  Service: Cardiovascular;  Laterality: N/A;   DILATION AND CURETTAGE OF UTERUS     implantable loop recorder placement  04/01/2020   Medtronic Reveal Linq model LNQ 22 [id number]) implantable loop recorder    LEFT HEART CATH AND CORONARY ANGIOGRAPHY N/A 03/09/2020   Procedure: LEFT HEART CATH AND CORONARY ANGIOGRAPHY;  Surgeon: Jettie Booze, MD;  Location: [place] [facility];  Service: Cardiovascular;  Laterality: N/A;  TUBAL LIGATION     Social History:  reports that she has never smoked. She has never used smokeless tobacco. She reports current alcohol use of about 1.0 standard drink per week. She reports that she does not use drugs. Patient lives at home  No Known Allergies  Family History  Problem Relation Age of Onset   Other Mother        "natural causes"   Heart disease Mother    Cancer Father         unsure of type      Prior to Admission medications   Medication Sig Start Date End Date Taking? Authorizing Provider  apixaban (ELIQUIS) 5 MG TABS tablet Take 1 tablet (5 mg total) by mouth 2 (two) times daily. 09/15/20  Yes Satira Sark, MD  Ascorbic Acid (VITAMIN C) 500 MG tablet Take 500 mg by mouth daily.     Yes [provider]  bimatoprost (LUMIGAN) 0.01 % SOLN Place 1 drop into both eyes at bedtime.   Yes [provider]  Calcium Carbonate Antacid (TUMS PO) Take 2 tablets by mouth 4 (four) times daily as needed (for acid reflux/indigestion).    Yes [provider]  Cholecalciferol (VITAMIN D) 400 UNITS capsule Take 400 Units by mouth daily.     Yes [provider]  ciclopirox (PENLAC) 8 % solution Apply 1 application topically at bedtime. 06/24/20  Yes [provider]  clopidogrel (PLAVIX) 75 MG tablet Take 1 tablet (75 mg total) by mouth daily with breakfast. 04/14/20  Yes Strader, [place] M, PA-C  Cyanocobalamin (VITAMIN B 12 PO) Take 1 tablet by mouth daily.   Yes [provider]  diclofenac Sodium (VOLTAREN) 1 % GEL Apply 2 g topically 4 (four) times daily. 01/09/20  Yes Ivy Lynn, NP  diltiazem (CARDIZEM CD) 240 MG 24 hr capsule Take 1 capsule (240 mg total) by mouth daily. 04/14/20  Yes Strader, [place] M, PA-C  Garlic 938 MG TABS Take 1 tablet by mouth daily.   Yes [provider]  HYDROcodone-acetaminophen (NORCO) 5-325 MG tablet Take 1 tablet by mouth every 6 (six) hours as needed for moderate pain. 09/14/20  Yes Ivy Lynn, NP  levothyroxine (SYNTHROID) 75 MCG tablet Take 1 tablet (75 mcg total) by mouth daily before breakfast. 08/12/20  Yes Brita Romp, NP  losartan (COZAAR) 50 MG tablet Take 1 tablet (50 mg total) by mouth daily. 04/14/20  Yes Strader, Fransisco Hertz, PA-C  MAGNESIUM CARBONATE PO Take 1 tablet by mouth daily. 400 mg   Yes [provider]  metFORMIN (GLUCOPHAGE-XR) 500  MG 24 hr tablet Take 1 tablet by mouth daily.  11/22/18  Yes [provider]  metoprolol tartrate (LOPRESSOR) 100 MG tablet TAKE ONE TABLET BY MOUTH TWICE DAILY 05/17/20  Yes Satira Sark, MD  nitroGLYCERIN (NITROSTAT) 0.4 MG SL tablet Place 0.4 mg under the tongue every 5 (five) minutes as needed for chest pain.   Yes [provider]  promethazine (PHENERGAN) 25 MG tablet 1 tablet as needed. 08/08/18  Yes [provider]  rosuvastatin (CRESTOR) 20 MG tablet TAKE ONE (1) TABLET BY MOUTH EVERY DAY 01/03/21  Yes Strader, [place] M, PA-C  timolol (TIMOPTIC) 0.5 % ophthalmic solution Place 1 drop into both eyes daily.   Yes [provider]  traZODone (DESYREL) 100 MG tablet TAKE ONE TABLET BY MOUTH AT BEDTIME 01/03/21  Yes Ivy Lynn, NP  TURMERIC PO Take 1 tablet by mouth daily.  Yes [provider]  VENTOLIN HFA 108 (90 Base) MCG/ACT inhaler Inhale 1-2 puffs into the lungs every 4 (four) hours as needed for shortness of breath. 12/21/15  Yes [provider]    Physical Exam: BP 134/86   Pulse 72   Temp 98.2 F (36.8 C) (Oral)   Resp (!) 23   Ht 5' 4"  (1.626 m)   Wt 74.4 kg   LMP  (LMP Unknown)   SpO2 98%   BMI 28.15 kg/m   General: Female. Awake and alert and oriented x3. No acute cardiopulmonary distress.  HEENT: Normocephalic atraumatic.  Right and left ears normal in appearance.  Pupils equal, round, reactive to light. Extraocular muscles are intact. Sclerae anicteric and noninjected.  Moist mucosal membranes. No mucosal lesions.  Neck: Neck supple without lymphadenopathy. No carotid bruits. No masses palpated.  Cardiovascular: Irregularly irregular tachycardic rate with normal S1-S2 sounds. No murmurs, rubs, gallops auscultated.  Increased JVD.  Respiratory: Good respiratory effort with no wheezes, rales, rhonchi. Lungs clear to auscultation bilaterally.  No accessory muscle use. Abdomen: Soft, nontender, nondistended.  Active bowel sounds. No masses or hepatosplenomegaly  Skin: No rashes, lesions, or ulcerations.  Dry, warm to touch. 2+ dorsalis pedis and radial pulses. Musculoskeletal: No calf or leg pain. All major joints not erythematous nontender.  No upper or lower joint deformation.  Good ROM.  No contractures  Psychiatric: Intact judgment and insight. Pleasant and cooperative. Neurologic: No focal neurological deficits. Strength is 5/5 and symmetric in upper and lower extremities.  Cranial nerves II through XII are grossly intact.           Labs on Admission: I have personally reviewed following labs and imaging studies  CBC: Recent Labs  Lab 01/09/21 1357  WBC 7.9  NEUTROABS 5.5  HGB 12.1  HCT 36.2  MCV 90.7  PLT 338   Basic Metabolic Panel: Recent Labs  Lab 01/09/21 1357  NA 135  K 4.0  CL 102  CO2 24  GLUCOSE 252*  BUN 15  CREATININE 0.94  CALCIUM 9.4   GFR: Estimated Creatinine Clearance: 47.2 mL/min (by C-G formula based on SCr of 0.94 mg/dL). Liver Function Tests: Recent Labs  Lab 01/09/21 1357  AST 29  ALT 74*  ALKPHOS 90  BILITOT 0.3  PROT 6.6  ALBUMIN 4.0   No results for input(s): LIPASE, AMYLASE in the last 168 hours. No results for input(s): AMMONIA in the last 168 hours. Coagulation Profile: No results for input(s): INR, PROTIME in the last 168 hours. Cardiac Enzymes: No results for input(s): CKTOTAL, CKMB, CKMBINDEX, TROPONINI in the last 168 hours. BNP (last 3 results) No results for input(s): PROBNP in the last 8760 hours. HbA1C: No results for input(s): HGBA1C in the last 72 hours. CBG: No results for input(s): GLUCAP in the last 168 hours. Lipid Profile: No results for input(s): CHOL, HDL, LDLCALC, TRIG, CHOLHDL, LDLDIRECT in the last 72 hours. Thyroid Function Tests: No results for input(s): TSH, T4TOTAL, FREET4, T3FREE, THYROIDAB in the last 72 hours. Anemia Panel: No results for input(s): VITAMINB12, FOLATE, FERRITIN, TIBC, IRON,  RETICCTPCT in the last 72 hours. Urine analysis:    Component Value Date/Time   COLORURINE YELLOW 04/12/2010 1758   APPEARANCEUR CLEAR 04/12/2010 1758   LABSPEC 1.011 04/12/2010 1758   PHURINE 7.0 04/12/2010 1758   HGBUR NEGATIVE 04/12/2010 1758   BILIRUBINUR NEGATIVE 04/12/2010 1758   KETONESUR NEGATIVE 04/12/2010 1758   PROTEINUR NEGATIVE 04/12/2010 1758   UROBILINOGEN 0.2 04/12/2010 1758  NITRITE NEGATIVE 04/12/2010 1758   LEUKOCYTESUR  04/12/2010 1758    NEGATIVE MICROSCOPIC NOT DONE ON URINES WITH NEGATIVE PROTEIN, BLOOD, LEUKOCYTES, NITRITE, OR GLUCOSE <1000 mg/dL.   Sepsis Labs: $RemoveBefo'@[id number]'[id number]$ (procalcitonin:4,lacticidven:4) ) Recent Results (from the past 240 hour(s))  Resp Panel by RT-PCR (Flu A&B, Covid) Nasopharyngeal Swab     Status: None   Collection Time: 01/09/21  1:55 PM   Specimen: Nasopharyngeal Swab; Nasopharyngeal(NP) swabs in vial transport medium  Result Value Ref Range Status   SARS Coronavirus 2 by RT PCR NEGATIVE NEGATIVE Final    Comment: (NOTE) SARS-CoV-2 target nucleic acids are NOT DETECTED.  The SARS-CoV-2 RNA is generally detectable in upper respiratory specimens during the acute phase of infection. The lowest concentration of SARS-CoV-2 viral copies this assay can detect is 138 copies/mL. A negative result does not preclude SARS-Cov-2 infection and should not be used as the sole basis for treatment or other patient management decisions. A negative result may occur with  improper specimen collection/handling, submission of specimen other than nasopharyngeal swab, presence of viral mutation(s) within the areas targeted by this assay, and inadequate number of viral copies(<138 copies/mL). A negative result must be combined with clinical observations, patient history, and epidemiological information. The expected result is Negative.  Fact Sheet for Patients:  EntrepreneurPulse.com.au  Fact Sheet for Healthcare Providers:   IncredibleEmployment.be  This test is no t yet approved or cleared by the [place] FDA and  has been authorized for detection and/or diagnosis of SARS-CoV-2 by FDA under an Emergency Use Authorization (EUA). This EUA will remain  in effect (meaning this test can be used) for the duration of the COVID-19 declaration under Section 564(b)(1) of the Act, 21 U.S.C.section 360bbb-3(b)(1), unless the authorization is terminated  or revoked sooner.       Influenza A by PCR NEGATIVE NEGATIVE Final   Influenza B by PCR NEGATIVE NEGATIVE Final    Comment: (NOTE) The Xpert Xpress SARS-CoV-2/FLU/RSV plus assay is intended as an aid in the diagnosis of influenza from Nasopharyngeal swab specimens and should not be used as a sole basis for treatment. Nasal washings and aspirates are unacceptable for Xpert Xpress SARS-CoV-2/FLU/RSV testing.  Fact Sheet for Patients: EntrepreneurPulse.com.au  Fact Sheet for Healthcare Providers: IncredibleEmployment.be  This test is not yet approved or cleared by the [place] FDA and has been authorized for detection and/or diagnosis of SARS-CoV-2 by FDA under an Emergency Use Authorization (EUA). This EUA will remain in effect (meaning this test can be used) for the duration of the COVID-19 declaration under Section 564(b)(1) of the Act, 21 U.S.C. section 360bbb-3(b)(1), unless the authorization is terminated or revoked.  Performed at [facility], [address] [place], [place] [address]      Radiological Exams on Admission: DG Chest Port 1 View  Result Date: 01/09/2021 CLINICAL DATA:  Shortness of breath with exertion with chest pain, cough and vomiting since Thursday. EXAM: PORTABLE CHEST 1 VIEW COMPARISON:  03/08/2020 FINDINGS: Loop recorder projects over the left chest. Lungs are adequately inflated without focal airspace consolidation or effusion. Coronary artery stent unchanged  projecting over the left heart. Mild stable cardiomegaly. Remainder of the exam is unchanged. IMPRESSION: No active disease. Electronically Signed   By: Marin Olp M.D.   On: 01/09/2021 14:47    EKG: Independently reviewed.  Fibrillation with rapid ventricular rate.  Right axis deviation.  No acute ST changes.  Assessment/Plan: Principal Problem:   Atrial fibrillation with RVR (HCC) Active Problems:   Mixed hyperlipidemia  Essential hypertension   Coronary atherosclerosis of native coronary artery   Diabetes mellitus ([place])   Acute CHF (congestive heart failure) ([place])    This patient was discussed with the ED physician, including pertinent vitals, physical exam findings, labs, and imaging.  We also discussed care given by the ED provider.  A. fib with RVR Admit to stepdown Start Cardizem drip with conversion over to home meds after criteria met Get echocardiogram Acute CHF Telemetry monitoring Strict I/O Daily Weights Diuresis: Lasix 40 mg twice daily Potassium: 40 mEq twice a day by mouth Echo cardiac exam tomorrow Repeat BMP tomorrow Diabetes Continue metformin Sliding scale insulin Coronary artery disease with hypertension Continue beta-blocker, statin Hyperlipidemia  DVT prophylaxis: On Eliquis Consultants: None Code Status: Full code Family Communication: Daughter present during interview and exam Disposition Plan: Patient should be able to return home following admission   Truett Mainland, DO

## 2021-01-09 NOTE — ED Notes (Signed)
Pt went to the bathroom with minimal assist. Pt missed the hat while urinating. Will try to collect urine again when pt is able.

## 2021-01-09 NOTE — ED Notes (Signed)
Patient states that she is having some chest discomfort and feeling nauseated.  Provider made aware. 12-lead captured.

## 2021-01-09 NOTE — ED Notes (Signed)
Ambulated pt down the hallway. HR stayed between 88-124.  SpO2 maintained between 95-97.  Patient states that she feels short of breath.

## 2021-01-10 ENCOUNTER — Inpatient Hospital Stay (HOSPITAL_COMMUNITY): Payer: Medicare Other

## 2021-01-10 DIAGNOSIS — I5021 Acute systolic (congestive) heart failure: Secondary | ICD-10-CM

## 2021-01-10 DIAGNOSIS — I4891 Unspecified atrial fibrillation: Secondary | ICD-10-CM | POA: Diagnosis not present

## 2021-01-10 LAB — ECHOCARDIOGRAM COMPLETE
AR max vel: 2.6 cm2
AV Area VTI: 2.24 cm2
AV Area mean vel: 2.72 cm2
AV Mean grad: 2 mmHg
AV Peak grad: 3.5 mmHg
Ao pk vel: 0.94 m/s
Area-P 1/2: 6.54 cm2
Height: 64 in
MV VTI: 1.72 cm2
S' Lateral: 2.7 cm
Weight: 2701.96 oz

## 2021-01-10 LAB — GLUCOSE, CAPILLARY
Glucose-Capillary: 169 mg/dL — ABNORMAL HIGH (ref 70–99)
Glucose-Capillary: 147 mg/dL — ABNORMAL HIGH (ref 70–99)
Glucose-Capillary: 127 mg/dL — ABNORMAL HIGH (ref 70–99)
Glucose-Capillary: 159 mg/dL — ABNORMAL HIGH (ref 70–99)

## 2021-01-10 LAB — MRSA NEXT GEN BY PCR, NASAL: MRSA by PCR Next Gen: NOT DETECTED

## 2021-01-10 LAB — BASIC METABOLIC PANEL
Anion gap: 7 (ref 5–15)
BUN: 15 mg/dL (ref 8–23)
CO2: 27 mmol/L (ref 22–32)
Calcium: 9 mg/dL (ref 8.9–10.3)
Chloride: 102 mmol/L (ref 98–111)
Creatinine, Ser: 0.87 mg/dL (ref 0.44–1.00)
GFR, Estimated: >60 mL/min (ref >60)
Glucose, Bld: 209 mg/dL — ABNORMAL HIGH (ref 70–99)
Potassium: 3.6 mmol/L (ref 3.5–5.1)
Sodium: 136 mmol/L (ref 135–145)

## 2021-01-10 LAB — CBC
HCT: 33.9 % — ABNORMAL LOW (ref 36.0–46.0)
Hemoglobin: 11.1 g/dL — ABNORMAL LOW (ref 12.0–15.0)
MCH: 29.8 pg (ref 26.0–34.0)
MCHC: 32.7 g/dL (ref 30.0–36.0)
MCV: 90.9 fL (ref 80.0–100.0)
Platelets: 176 10*3/uL (ref 150–400)
RBC: 3.73 MIL/uL — ABNORMAL LOW (ref 3.87–5.11)
RDW: 12.9 % (ref 11.5–15.5)
WBC: 7.7 10*3/uL (ref 4.0–10.5)
nRBC: 0 % (ref 0.0–0.2)

## 2021-01-10 LAB — HEMOGLOBIN A1C
Hgb A1c MFr Bld: 6.6 % — ABNORMAL HIGH (ref 4.8–5.6)
Mean Plasma Glucose: 142.72 mg/dL

## 2021-01-10 LAB — CBG MONITORING, ED: Glucose-Capillary: 165 mg/dL — ABNORMAL HIGH (ref 70–99)

## 2021-01-10 LAB — TSH: TSH: 1.995 u[IU]/mL (ref 0.350–4.500)

## 2021-01-10 MED ORDER — ALBUTEROL SULFATE (2.5 MG/3ML) 0.083% IN NEBU: 2.5000 mg | INHALATION_SOLUTION | RESPIRATORY_TRACT | Status: DC | PRN

## 2021-01-10 MED ORDER — LEVOTHYROXINE SODIUM 50 MCG PO TABS: 75.0000 ug | ORAL_TABLET | Freq: Every day | ORAL | Status: DC

## 2021-01-10 MED ORDER — TRAZODONE HCL 50 MG PO TABS
100.0000 mg | ORAL_TABLET | Freq: Every day | ORAL | Status: DC
  Administered 2021-01-10 – 2021-01-11 (×2): 100 mg via ORAL
  Filled 2021-01-10 (×2): qty 2

## 2021-01-10 MED ORDER — DICLOFENAC SODIUM 1 % EX GEL
2.0000 g | Freq: Four times a day (QID) | CUTANEOUS | Status: DC
  Filled 2021-01-10: qty 100

## 2021-01-10 MED ORDER — APIXABAN 5 MG PO TABS
5.0000 mg | ORAL_TABLET | Freq: Two times a day (BID) | ORAL | Status: DC
  Administered 2021-01-10 – 2021-01-12 (×5): 5 mg via ORAL
  Filled 2021-01-10 (×6): qty 1

## 2021-01-10 MED ORDER — INSULIN ASPART 100 UNIT/ML IJ SOLN
0.0000 [IU] | Freq: Three times a day (TID) | INTRAMUSCULAR | Status: DC
  Administered 2021-01-10: 2 [IU] via SUBCUTANEOUS
  Administered 2021-01-10 – 2021-01-12 (×7): 1 [IU] via SUBCUTANEOUS

## 2021-01-10 MED ORDER — ONDANSETRON HCL 4 MG PO TABS: 4.0000 mg | ORAL_TABLET | Freq: Four times a day (QID) | ORAL | Status: DC | PRN

## 2021-01-10 MED ORDER — FUROSEMIDE 10 MG/ML IJ SOLN
40.0000 mg | Freq: Two times a day (BID) | INTRAMUSCULAR | Status: DC
  Administered 2021-01-10 – 2021-01-12 (×5): 40 mg via INTRAVENOUS
  Filled 2021-01-10 (×5): qty 4

## 2021-01-10 MED ORDER — CLOPIDOGREL BISULFATE 75 MG PO TABS
75.0000 mg | ORAL_TABLET | Freq: Every day | ORAL | Status: DC
  Administered 2021-01-10 – 2021-01-12 (×3): 75 mg via ORAL
  Filled 2021-01-10 (×3): qty 1

## 2021-01-10 MED ORDER — ROSUVASTATIN CALCIUM 20 MG PO TABS
20.0000 mg | ORAL_TABLET | Freq: Every day | ORAL | Status: DC
  Administered 2021-01-10 – 2021-01-12 (×3): 20 mg via ORAL
  Filled 2021-01-10 (×3): qty 1

## 2021-01-10 MED ORDER — METOPROLOL TARTRATE 25 MG PO TABS: 25.0000 mg | ORAL_TABLET | Freq: Two times a day (BID) | ORAL | Status: DC

## 2021-01-10 MED ORDER — ONDANSETRON HCL 4 MG/2ML IJ SOLN: 4.0000 mg | Freq: Four times a day (QID) | INTRAMUSCULAR | Status: DC | PRN

## 2021-01-10 MED ORDER — LEVOTHYROXINE SODIUM 75 MCG PO TABS
75.0000 ug | ORAL_TABLET | Freq: Every day | ORAL | Status: DC
Start: 2021-01-10 — End: 2021-01-12
  Administered 2021-01-10 – 2021-01-12 (×3): 75 ug via ORAL
  Filled 2021-01-10 (×3): qty 1

## 2021-01-10 MED ORDER — METFORMIN HCL ER 500 MG PO TB24
500.0000 mg | ORAL_TABLET | Freq: Every day | ORAL | Status: DC
  Administered 2021-01-10 – 2021-01-12 (×3): 500 mg via ORAL
  Filled 2021-01-10 (×4): qty 1

## 2021-01-10 MED ORDER — METOPROLOL TARTRATE 25 MG PO TABS
25.0000 mg | ORAL_TABLET | Freq: Three times a day (TID) | ORAL | Status: DC
  Administered 2021-01-10 – 2021-01-12 (×6): 25 mg via ORAL
  Filled 2021-01-10 (×6): qty 1

## 2021-01-10 MED ORDER — POTASSIUM CHLORIDE CRYS ER 20 MEQ PO TBCR
40.0000 meq | EXTENDED_RELEASE_TABLET | Freq: Two times a day (BID) | ORAL | Status: DC
  Administered 2021-01-10 – 2021-01-12 (×5): 40 meq via ORAL
  Filled 2021-01-10 (×5): qty 2

## 2021-01-10 MED ORDER — CHLORHEXIDINE GLUCONATE CLOTH 2 % EX PADS
6.0000 | MEDICATED_PAD | Freq: Every day | CUTANEOUS | Status: DC
  Administered 2021-01-10 – 2021-01-12 (×5): 6 via TOPICAL

## 2021-01-10 MED ORDER — TIMOLOL MALEATE 0.5 % OP SOLN: 1.0000 [drp] | Freq: Every day | OPHTHALMIC | Status: DC

## 2021-01-10 MED ORDER — LATANOPROST 0.005 % OP SOLN
1.0000 [drp] | Freq: Every day | OPHTHALMIC | Status: DC
  Administered 2021-01-10 – 2021-01-11 (×2): 1 [drp] via OPHTHALMIC
  Filled 2021-01-10 (×2): qty 2.5

## 2021-01-10 MED ORDER — INSULIN ASPART 100 UNIT/ML IJ SOLN: 0.0000 [IU] | Freq: Every day | INTRAMUSCULAR | Status: DC

## 2021-01-10 MED ORDER — ALBUTEROL SULFATE HFA 108 (90 BASE) MCG/ACT IN AERS
1.0000 | INHALATION_SPRAY | RESPIRATORY_TRACT | Status: DC | PRN
Start: 2021-01-10 — End: 2021-01-10

## 2021-01-10 MED ORDER — LIVING BETTER WITH HEART FAILURE BOOK: Freq: Once | Status: AC

## 2021-01-10 NOTE — TOC Initial Note (Signed)
Transition of Care [facility]) - Initial/Assessment Note    Patient Details  Name: [name] MRN: [id number] Date of Birth: [birth year]  [facility]) CM/SW Contact:    Shade Flood, LCSW Phone Number: 01/10/2021, 11:44 AM  Clinical Narrative:                  Pt admitted from home. Received TOC consult for Heart Failure screen. Met with pt and her daughter at bedside to assess. Per pt, she lives alone at home and she is independent in ADLs. Pt states she does not use any DME at home. Pt does not drive. Her daughter assists pt in getting to appointments and obtaining medications. Pt and her daughter state that pt fills out paperwork from the doctor and the pharmacy to get assistance with the cost of her Eliquis.   Reviewed CHF screen with pt. She states she is aware of the cardiac diet and she follows it the best she can. Pt does not weigh daily. She states that Dr. Domenic Polite is her Cardiologist. CHF educational book ordered for pt and RN will provide. Updated pt on same.  Pt is not anticipating any new TOC needs for dc. Will follow and assist if needs arise.  Expected Discharge Plan: Home/Self Care Barriers to Discharge: Continued Medical Work up   Patient Goals and CMS Choice Patient states their goals for this hospitalization and ongoing recovery are:: go home      Expected Discharge Plan and Services Expected Discharge Plan: Home/Self Care In-house Referral: Clinical Social Work     Living arrangements for the past 2 months: Single Family Home                                      Prior Living Arrangements/Services Living arrangements for the past 2 months: Single Family Home Lives with:: Self Patient language and need for interpreter reviewed:: Yes Do you feel safe going back to the place where you live?: Yes      Need for Family Participation in Patient Care: No (Comment) Care giver support system in place?: Yes (comment)   Criminal Activity/Legal  Involvement Pertinent to Current Situation/Hospitalization: No - Comment as needed  Activities of Daily Living Home Assistive Devices/Equipment: None ADL Screening (condition at time of admission) Patient's cognitive ability adequate to safely complete daily activities?: Yes Is the patient deaf or have difficulty hearing?: No Does the patient have difficulty seeing, even when wearing glasses/contacts?: No Does the patient have difficulty concentrating, remembering, or making decisions?: No Patient able to express need for assistance with ADLs?: Yes Does the patient have difficulty dressing or bathing?: Yes Independently performs ADLs?: Yes (appropriate for developmental age) Does the patient have difficulty walking or climbing stairs?: No Weakness of Legs: None Weakness of Arms/Hands: None  Permission Sought/Granted                  Emotional Assessment Appearance:: Appears younger than stated age Attitude/Demeanor/Rapport: Engaged Affect (typically observed): Pleasant Orientation: : Oriented to Self, Oriented to Place, Oriented to  Time, Oriented to Situation Alcohol / Substance Use: Not Applicable Psych Involvement: No (comment)  Admission diagnosis:  SOB (shortness of breath) [R06.02] Atrial fibrillation with RVR ([place]) [I48.91] Patient Active Problem List   Diagnosis Date Noted   Acute CHF (congestive heart failure) ([place]) 01/09/2021   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   Persistent atrial fibrillation (  [place]) 07/15/2020   Low back pain 03/17/2020   Non-ST elevation (NSTEMI) myocardial infarction [facility])    Unstable angina ([place]) 03/08/2020   Hypothyroidism 02/06/2020   Paroxysmal atrial fibrillation ([place]) 12/16/2019   Secondary hypercoagulable state ([place]) 12/16/2019   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   Healthcare maintenance 10/02/2019   Anxiety with depression 10/02/2019   Numbness 04/02/2018   Numbness of tongue 04/02/2018   Neck pain on right side  04/02/2018   Diabetes mellitus ([place]) 12/21/2017   Atrial fibrillation with rapid ventricular response (HCC)    Chest pain in adult    Atrial fibrillation with RVR ([place]) 12/17/2017   Accelerating angina ([place])    Exertional angina ([place]) 02/23/2015   Chest pain 11/01/2012   Mixed hyperlipidemia 11/12/2009   Essential hypertension 11/12/2009   Coronary atherosclerosis of native coronary artery 09/25/2008   Pain of back and left lower extremity 02/19/2008   PCP:  Ivy Lynn, NP Pharmacy:   [place], [place] [address] Phone: [phone] Fax: [phone]     Social Determinants of Health (SDOH) Interventions    Readmission Risk Interventions Readmission Risk Prevention Plan 01/10/2021  Medication Screening Complete  Transportation Screening Complete  Some recent data might be hidden

## 2021-01-10 NOTE — Progress Notes (Addendum)
PROGRESS NOTE     [name], is a 80 y.o. female, DOB - [birth year], QQI:[id number]  Admit date - 01/09/2021   Admitting Physician Truett Mainland, DO  Outpatient Primary MD for the patient is Ivy Lynn, NP  LOS - 1  Chief Complaint  Patient presents with   Shortness of Breath        Brief Narrative:   80 y.o. female with a history of coronary artery disease with multiple stents, HTN, PAFib  on chronic anticoagulation, DM2, hypothyroidism, GERD   Assessment & Plan:   Principal Problem:   Atrial fibrillation with RVR ([place]) Active Problems:   Mixed hyperlipidemia   Essential hypertension   Coronary atherosclerosis of native coronary artery   Diabetes mellitus ([place])   Acute CHF (congestive heart failure) ([place])   1)PAFib--now with RVR - Pt has ILR in place with followed by Dr. Rayann Heman.  Most recent interrogation showed 5.8% AF burden down from 26%. -Continue IV Cardizem drip for rate control with plans to de-escalate back to p.o. Cardizem and Lopressor -Repeat echo shows EF of 55 to 60%,moderately elevated pulmonary artery systolic pressure.  -Continue Eliquis for stroke prophylaxis as CHA2DS2-VASc is 6  2)CAD status post DES to the LAD in 2010, DES to the LAD in 2017, and DES to the ramus intermedius as well as DES x2 to the RCA in January 2022--- --- Continue Plavix through end of January 2023 -Continue metoprolol and Crestor, LDL 37 Even if her lipid panel is within desired limits, patient should still take -Crestor for it's Pleiotropic effects (beyond cholesterol lowering benefits)  3)HFpEF--- history of chronic diastolic dysfunction CHF, echo from 03/10/2020 showed EF 55 to 60% with grade 2 diastolic dysfunction -Repeat echo pending -Worsening dyspnea due to A. fib with RVR -Continue IV Lasix -Daily weight and fluid input and output monitoring  4)DM2-A1c 6.6 reflecting fair diabetic control PTA -Continue metformin Use Novolog/Humalog Sliding scale insulin  with Accu-Cheks/Fingersticks as ordered   5)Hypothyroidism-continue levothyroxine except for glucose daily -Check TSH    Disposition/Need for in-Hospital Stay- patient unable to be discharged at this time due to --A. fib with RVR requiring IV Cardizem for rate control, worsening CHF with dyspnea requiring IV Lasix for diuresis -Anticipate discharge home in 1 to 2 days if rate control and diuresis are effective  Status is: Inpatient  Remains inpatient appropriate because: Please see disposition above  Disposition: The patient is from: Home              Anticipated d/c is to: Home              Anticipated d/c date is: 1 day              Patient currently is not medically stable to d/c. Barriers: Not Clinically Stable-   Code Status :  -  Code Status: Full Code   Family Communication:    NA (patient is alert, awake and coherent)   Consults  :  Na  DVT Prophylaxis  :   - SCDs  apixaban (ELIQUIS) tablet 5 mg    Lab Results  Component Value Date   PLT 176 01/10/2021    Inpatient Medications  Scheduled Meds:  apixaban  5 mg Oral BID   Chlorhexidine Gluconate Cloth  6 each Topical Daily   clopidogrel  75 mg Oral Q breakfast   diclofenac Sodium  2 g Topical QID   furosemide  40 mg Intravenous BID   insulin aspart  0-5 Units  Subcutaneous QHS   insulin aspart  0-9 Units Subcutaneous TID WC   latanoprost  1 drop Both Eyes QHS   levothyroxine  75 mcg Oral Q0600   metFORMIN  500 mg Oral Q breakfast   potassium chloride  40 mEq Oral BID   rosuvastatin  20 mg Oral Daily   timolol  1 drop Both Eyes Daily   traZODone  100 mg Oral QHS   Continuous Infusions:  diltiazem (CARDIZEM) infusion 15 mg/hr (01/10/21 1412)   PRN Meds:.albuterol, ondansetron **OR** ondansetron (ZOFRAN) IV   Anti-infectives (From admission, onward)    None         Subjective: [name] today has no fevers, no emesis,  [name],   -Dyspnea and palpitations and dizziness improving No fever   Or chills   No Nausea, Vomiting or Diarrhea  Objective: Vitals:   01/10/21 1000 01/10/21 1100 01/10/21 1200 01/10/21 1300  BP: (!) 135/106 125/83  129/70  Pulse: (!) 50 68 (!) 42 (!) 43  Resp:   18 16  Temp:  98.4 F (36.9 C)    TempSrc:  Oral    SpO2: 98% 97% 98% 97%  Weight:      Height:        Intake/Output Summary (Last 24 hours) at 01/10/2021 1413 Last data filed at 01/10/2021 1345 Gross per 24 hour  Intake 734.7 ml  Output 1150 ml  Net -415.3 ml   Filed Weights   01/09/21 1336 01/10/21 0342  Weight: 74.4 kg 76.6 kg    Physical Exam  Gen:- Awake Alert, in no acute distress HEENT:- [place].AT, No sclera icterus Neck-Supple Neck,No JVD,.  Lungs-  CTAB , fair symmetrical air movement CV- S1, S2 normal, irregularly irregular and tachycardic Abd-  +ve B.Sounds, Abd Soft, No tenderness,    Extremity/Skin:- No  edema, pedal pulses present  Psych-affect is appropriate, oriented x3 Neuro-no new focal deficits, no tremors  Data Reviewed: I have personally reviewed following labs and imaging studies  CBC: Recent Labs  Lab 01/09/21 1357 01/10/21 0500  WBC 7.9 7.7  NEUTROABS 5.5  --   HGB 12.1 11.1*  HCT 36.2 33.9*  MCV 90.7 90.9  PLT 194 734   Basic Metabolic Panel: Recent Labs  Lab 01/09/21 1357 01/10/21 0500  NA 135 136  K 4.0 3.6  CL 102 102  CO2 24 27  GLUCOSE 252* 209*  BUN 15 15  CREATININE 0.94 0.87  CALCIUM 9.4 9.0   GFR: Estimated Creatinine Clearance: 51.7 mL/min (by C-G formula based on SCr of 0.87 mg/dL). Liver Function Tests: Recent Labs  Lab 01/09/21 1357  AST 29  ALT 74*  ALKPHOS 90  BILITOT 0.3  PROT 6.6  ALBUMIN 4.0   No results for input(s): LIPASE, AMYLASE in the last 168 hours. No results for input(s): AMMONIA in the last 168 hours. Coagulation Profile: No results for input(s): INR, PROTIME in the last 168 hours. Cardiac Enzymes: No results for input(s): CKTOTAL, CKMB, CKMBINDEX, TROPONINI in the last 168 hours. BNP  (last 3 results) No results for input(s): PROBNP in the last 8760 hours. HbA1C: Recent Labs    01/10/21 0500  HGBA1C 6.6*   CBG: Recent Labs  Lab 01/10/21 0139 01/10/21 0726 01/10/21 1108  GLUCAP 165* 169* 147*   Lipid Profile: No results for input(s): CHOL, HDL, LDLCALC, TRIG, CHOLHDL, LDLDIRECT in the last 72 hours. Thyroid Function Tests: No results for input(s): TSH, T4TOTAL, FREET4, T3FREE, THYROIDAB in the last 72 hours. Anemia Panel:  No results for input(s): VITAMINB12, FOLATE, FERRITIN, TIBC, IRON, RETICCTPCT in the last 72 hours. Urine analysis:    Component Value Date/Time   COLORURINE STRAW (A) 01/09/2021 2032   APPEARANCEUR CLEAR 01/09/2021 2032   LABSPEC 1.005 01/09/2021 2032   PHURINE 6.0 01/09/2021 2032   GLUCOSEU NEGATIVE 01/09/2021 2032   HGBUR NEGATIVE 01/09/2021 2032   BILIRUBINUR NEGATIVE 01/09/2021 2032   KETONESUR NEGATIVE 01/09/2021 2032   PROTEINUR NEGATIVE 01/09/2021 2032   UROBILINOGEN 0.2 04/12/2010 1758   NITRITE NEGATIVE 01/09/2021 2032   LEUKOCYTESUR NEGATIVE 01/09/2021 2032   Sepsis Labs: @LABRCNTIP (procalcitonin:4,lacticidven:4)  ) Recent Results (from the past 240 hour(s))  Resp Panel by RT-PCR (Flu A&B, Covid) Nasopharyngeal Swab     Status: None   Collection Time: 01/09/21  1:55 PM   Specimen: Nasopharyngeal Swab; Nasopharyngeal(NP) swabs in vial transport medium  Result Value Ref Range Status   SARS Coronavirus 2 by RT PCR NEGATIVE NEGATIVE Final    Comment: (NOTE) SARS-CoV-2 target nucleic acids are NOT DETECTED.  The SARS-CoV-2 RNA is generally detectable in upper respiratory specimens during the acute phase of infection. The lowest concentration of SARS-CoV-2 viral copies this assay can detect is 138 copies/mL. A negative result does not preclude SARS-Cov-2 infection and should not be used as the sole basis for treatment or other patient management decisions. A negative result may occur with  improper specimen  collection/handling, submission of specimen other than nasopharyngeal swab, presence of viral mutation(s) within the areas targeted by this assay, and inadequate number of viral copies(<138 copies/mL). A negative result must be combined with clinical observations, patient history, and epidemiological information. The expected result is Negative.  Fact Sheet for Patients:  EntrepreneurPulse.com.au  Fact Sheet for Healthcare Providers:  IncredibleEmployment.be  This test is no t yet approved or cleared by the [place] FDA and  has been authorized for detection and/or diagnosis of SARS-CoV-2 by FDA under an Emergency Use Authorization (EUA). This EUA will remain  in effect (meaning this test can be used) for the duration of the COVID-19 declaration under Section 564(b)(1) of the Act, 21 U.S.C.section 360bbb-3(b)(1), unless the authorization is terminated  or revoked sooner.       Influenza A by PCR NEGATIVE NEGATIVE Final   Influenza B by PCR NEGATIVE NEGATIVE Final    Comment: (NOTE) The Xpert Xpress SARS-CoV-2/FLU/RSV plus assay is intended as an aid in the diagnosis of influenza from Nasopharyngeal swab specimens and should not be used as a sole basis for treatment. Nasal washings and aspirates are unacceptable for Xpert Xpress SARS-CoV-2/FLU/RSV testing.  Fact Sheet for Patients: EntrepreneurPulse.com.au  Fact Sheet for Healthcare Providers: IncredibleEmployment.be  This test is not yet approved or cleared by the [place] FDA and has been authorized for detection and/or diagnosis of SARS-CoV-2 by FDA under an Emergency Use Authorization (EUA). This EUA will remain in effect (meaning this test can be used) for the duration of the COVID-19 declaration under Section 564(b)(1) of the Act, 21 U.S.C. section 360bbb-3(b)(1), unless the authorization is terminated or revoked.  Performed at [facility], [address], [place], [place] [address]      Radiology Studies: [facility] 1 View  Result Date: 01/09/2021 CLINICAL DATA:  Shortness of breath with exertion with chest pain, cough and vomiting since Thursday. EXAM: PORTABLE CHEST 1 VIEW COMPARISON:  03/08/2020 FINDINGS: Loop recorder projects over the left chest. Lungs are adequately inflated without focal airspace consolidation or effusion. Coronary artery stent unchanged projecting over the left  heart. Mild stable cardiomegaly. Remainder of the exam is unchanged. IMPRESSION: No active disease. Electronically Signed   By: Marin Olp M.D.   On: 01/09/2021 14:47   ECHOCARDIOGRAM COMPLETE  Result Date: 01/10/2021    ECHOCARDIOGRAM REPORT   Patient Name:   [name] Date of Exam: 01/10/2021 Medical Rec #:  [id number]      Height:       64.0 in Accession #:    [id number]     Weight:       168.9 lb Date of Birth:  Nov 11, 1940      BSA:          1.821 m Patient Age:    8 years       BP:           145/80 mmHg Patient Gender: F              HR:           99 bpm. Exam Location:  [facility] Procedure: 2D Echo, Cardiac Doppler and Color Doppler Indications:    CHF-Acute Systolic  History:        Patient has prior history of Echocardiogram examinations, most                 recent 03/10/2020. CHF, Previous Myocardial Infarction and CAD,                 Arrythmias:Atrial Fibrillation, Signs/Symptoms:Chest Pain; Risk                 Factors:Hypertension, Diabetes and Dyslipidemia.  Sonographer:    Wenda Low Referring Phys: [place]  1. Left ventricular ejection fraction, by estimation, is 55 to 60%. The left ventricle has normal function. The left ventricle has no regional wall motion abnormalities. There is mild left ventricular hypertrophy. Left ventricular diastolic parameters are indeterminate.  2. Right ventricular systolic function is normal. The right ventricular size is normal. There is moderately elevated  pulmonary artery systolic pressure.  3. Left atrial size was mildly dilated.  4. The mitral valve is normal in structure. No evidence of mitral valve regurgitation. No evidence of mitral stenosis. Moderate mitral annular calcification.  5. The tricuspid valve is abnormal. Tricuspid valve regurgitation is moderate.  6. The aortic valve is tricuspid. There is moderate calcification of the aortic valve. There is moderate thickening of the aortic valve. Aortic valve regurgitation is mild. No aortic stenosis is present.  7. The inferior vena cava is dilated in size with >50% respiratory variability, suggesting right atrial pressure of 8 mmHg. FINDINGS  Left Ventricle: Left ventricular ejection fraction, by estimation, is 55 to 60%. The left ventricle has normal function. The left ventricle has no regional wall motion abnormalities. The left ventricular internal cavity size was normal in size. There is  mild left ventricular hypertrophy. Left ventricular diastolic parameters are indeterminate. Right Ventricle: The right ventricular size is normal. No increase in right ventricular wall thickness. Right ventricular systolic function is normal. There is moderately elevated pulmonary artery systolic pressure. The tricuspid regurgitant velocity is 3.05 m/s, and with an assumed right atrial pressure of 8 mmHg, the estimated right ventricular systolic pressure is 63.3 mmHg. Left Atrium: Left atrial size was mildly dilated. Right Atrium: Right atrial size was normal in size. Pericardium: There is no evidence of pericardial effusion. Mitral Valve: The mitral valve is normal in structure. There is mild thickening of the mitral valve leaflet(s). There is mild calcification of the  mitral valve leaflet(s). Moderate mitral annular calcification. No evidence of mitral valve regurgitation. No evidence of mitral valve stenosis. MV peak gradient, 9.4 mmHg. The mean mitral valve gradient is 3.0 mmHg. Tricuspid Valve: The tricuspid valve is  abnormal. Tricuspid valve regurgitation is moderate . No evidence of tricuspid stenosis. Aortic Valve: The aortic valve is tricuspid. There is moderate calcification of the aortic valve. There is moderate thickening of the aortic valve. There is moderate aortic valve annular calcification. Aortic valve regurgitation is mild. No aortic stenosis is present. Aortic valve mean gradient measures 2.0 mmHg. Aortic valve peak gradient measures 3.5 mmHg. Aortic valve area, by VTI measures 2.24 cm. Pulmonic Valve: The pulmonic valve was not well visualized. Pulmonic valve regurgitation is trivial. No evidence of pulmonic stenosis. Aorta: The aortic root is normal in size and structure. Venous: The inferior vena cava is dilated in size with greater than 50% respiratory variability, suggesting right atrial pressure of 8 mmHg. IAS/Shunts: No atrial level shunt detected by color flow Doppler.  LEFT VENTRICLE PLAX 2D LVIDd:         3.30 cm   Diastology LVIDs:         2.70 cm   LV e' medial:    7.95 cm/s LV PW:         1.20 cm   LV E/e' medial:  16.7 LV IVS:        1.10 cm   LV e' lateral:   10.90 cm/s LVOT diam:     2.00 cm   LV E/e' lateral: 12.2 LV SV:         46 LV SV Index:   25 LVOT Area:     3.14 cm  RIGHT VENTRICLE RV Basal diam:  3.10 cm RV Mid diam:    2.80 cm RV S prime:     9.49 cm/s LEFT ATRIUM             Index        RIGHT ATRIUM           Index LA diam:        3.70 cm 2.03 cm/m   RA Area:     20.40 cm LA Vol (A2C):   74.5 ml 40.92 ml/m  RA Volume:   56.90 ml  31.25 ml/m LA Vol (A4C):   53.7 ml 29.49 ml/m LA Biplane Vol: 63.8 ml 35.04 ml/m  AORTIC VALVE                    PULMONIC VALVE AV Area (Vmax):    2.60 cm     PV Vmax:       0.95 m/s AV Area (Vmean):   2.72 cm     PV Peak grad:  3.6 mmHg AV Area (VTI):     2.24 cm AV Vmax:           94.00 cm/s AV Vmean:          57.550 cm/s AV VTI:            0.207 m AV Peak Grad:      3.5 mmHg AV Mean Grad:      2.0 mmHg LVOT Vmax:         77.65 cm/s LVOT Vmean:         49.750 cm/s LVOT VTI:          0.147 m LVOT/AV VTI ratio: 0.71  AORTA Ao Root diam: 3.00 cm Ao Asc diam:  2.40 cm MITRAL  VALVE                TRICUSPID VALVE MV Area (PHT): 6.54 cm     TR Peak grad:   37.2 mmHg MV Area VTI:   1.72 cm     TR Vmax:        305.00 cm/s MV Peak grad:  9.4 mmHg MV Mean grad:  3.0 mmHg     SHUNTS MV Vmax:       1.53 m/s     Systemic VTI:  0.15 m MV Vmean:      74.2 cm/s    Systemic Diam: 2.00 cm MV Decel Time: 116 msec MV E velocity: 133.00 cm/s Carlyle Dolly MD Electronically signed by Carlyle Dolly MD Signature Date/Time: 01/10/2021/10:35:37 AM    Final      Scheduled Meds:  apixaban  5 mg Oral BID   Chlorhexidine Gluconate Cloth  6 each Topical Daily   clopidogrel  75 mg Oral Q breakfast   diclofenac Sodium  2 g Topical QID   furosemide  40 mg Intravenous BID   insulin aspart  0-5 Units Subcutaneous QHS   insulin aspart  0-9 Units Subcutaneous TID WC   latanoprost  1 drop Both Eyes QHS   levothyroxine  75 mcg Oral Q0600   metFORMIN  500 mg Oral Q breakfast   potassium chloride  40 mEq Oral BID   rosuvastatin  20 mg Oral Daily   timolol  1 drop Both Eyes Daily   traZODone  100 mg Oral QHS   Continuous Infusions:  diltiazem (CARDIZEM) infusion 15 mg/hr (01/10/21 1412)     LOS: 1 day   Roxan Hockey M.D on 01/10/2021 at 2:13 PM  Go to [email or web address] - for contact info  [facility] - Office  [phone]  If 7PM-7AM, please contact night-coverage [email or web address] Password [facility] 01/10/2021, 2:13 PM

## 2021-01-10 NOTE — Plan of Care (Signed)

## 2021-01-10 NOTE — Progress Notes (Signed)
*  PRELIMINARY RESULTS* Echocardiogram 2D Echocardiogram has been performed.  [name] 01/10/2021, 9:51 AM

## 2021-01-11 DIAGNOSIS — I4891 Unspecified atrial fibrillation: Secondary | ICD-10-CM | POA: Diagnosis not present

## 2021-01-11 LAB — BASIC METABOLIC PANEL
Anion gap: 7 (ref 5–15)
BUN: 14 mg/dL (ref 8–23)
CO2: 28 mmol/L (ref 22–32)
Calcium: 9.3 mg/dL (ref 8.9–10.3)
Chloride: 103 mmol/L (ref 98–111)
Creatinine, Ser: 0.76 mg/dL (ref 0.44–1.00)
GFR, Estimated: >60 mL/min (ref >60)
Glucose, Bld: 166 mg/dL — ABNORMAL HIGH (ref 70–99)
Potassium: 3.9 mmol/L (ref 3.5–5.1)
Sodium: 138 mmol/L (ref 135–145)

## 2021-01-11 LAB — GLUCOSE, CAPILLARY
Glucose-Capillary: 125 mg/dL — ABNORMAL HIGH (ref 70–99)
Glucose-Capillary: 132 mg/dL — ABNORMAL HIGH (ref 70–99)
Glucose-Capillary: 129 mg/dL — ABNORMAL HIGH (ref 70–99)
Glucose-Capillary: 151 mg/dL — ABNORMAL HIGH (ref 70–99)

## 2021-01-11 MED ORDER — POLYETHYLENE GLYCOL 3350 17 G PO PACK
17.0000 g | PACK | Freq: Two times a day (BID) | ORAL | Status: DC
  Administered 2021-01-11 – 2021-01-12 (×3): 17 g via ORAL
  Filled 2021-01-11 (×3): qty 1

## 2021-01-11 NOTE — Progress Notes (Signed)
PROGRESS NOTE     [name], is a 80 y.o. female, DOB - [birth year], FFM:[id number]  Admit date - 01/09/2021   Admitting Physician No admitting provider for patient encounter.  Outpatient Primary MD for the patient is Ivy Lynn, NP  LOS - 2  Chief Complaint  Patient presents with   Shortness of Breath        Brief Narrative:   80 y.o. female with a history of coronary artery disease with multiple stents, HTN, PAFib  on chronic anticoagulation, DM2, hypothyroidism, GERD   Assessment & Plan:   Principal Problem:   Atrial fibrillation with RVR ([place]) Active Problems:   Mixed hyperlipidemia   Essential hypertension   Coronary atherosclerosis of native coronary artery   Diabetes mellitus ([place])   Acute CHF (congestive heart failure) ([place])   1)PAFib--now with RVR - Pt has ILR in place with followed by Dr. Rayann Heman.  Most recent interrogation showed 5.8% AF burden down from 26%. -Rate control remains challenging tachycardia persist with attempts to wean down IV Cardizem -Continue IV Cardizem drip for rate control with plans to de-escalate back to p.o. Cardizem and Lopressor -Repeat echo shows EF of 55 to 60%,moderately elevated pulmonary artery systolic pressure.  -Continue Eliquis for stroke prophylaxis as CHA2DS2-VASc is 6  2)CAD status post DES to the LAD in 2010, DES to the LAD in 2017, and DES to the ramus intermedius as well as DES x2 to the RCA in January 2022--- --- Continue Plavix through end of January 2023 -Continue metoprolol and Crestor, LDL 37 Even if her lipid panel is within desired limits, patient should still take -Crestor for it's Pleiotropic effects (beyond cholesterol lowering benefits)  3)HFpEF--- history of chronic diastolic dysfunction CHF, echo from 03/10/2020 showed EF 55 to 60% with grade 2 diastolic dysfunction -Repeat echo pending -Worsening dyspnea due to A. fib with RVR -Continue IV Lasix -Daily weight and fluid input and output  monitoring  4)DM2-A1c 6.6 reflecting fair diabetic control PTA -Continue metformin Use Novolog/Humalog Sliding scale insulin with Accu-Cheks/Fingersticks as ordered   5)Hypothyroidism-continue levothyroxine except for glucose daily -Check TSH    Disposition/Need for in-Hospital Stay- patient unable to be discharged at this time due to --A. fib with RVR requiring IV Cardizem for rate control, worsening CHF with dyspnea requiring IV Lasix for diuresis -Anticipate discharge home in 1 to 2 days if rate control and diuresis are effective  Status is: Inpatient  Remains inpatient appropriate because: Please see disposition above  Disposition: The patient is from: Home              Anticipated d/c is to: Home              Anticipated d/c date is: 1 day              Patient currently is not medically stable to d/c. Barriers: Not Clinically Stable-   Code Status :  -  Code Status: Full Code   Family Communication:    NA (patient is alert, awake and coherent)   Consults  :  Na  DVT Prophylaxis  :   - SCDs  apixaban (ELIQUIS) tablet 5 mg    Lab Results  Component Value Date   PLT 176 01/10/2021    Inpatient Medications  Scheduled Meds:  apixaban  5 mg Oral BID   Chlorhexidine Gluconate Cloth  6 each Topical Daily   clopidogrel  75 mg Oral Q breakfast   diclofenac Sodium  2 g Topical QID  furosemide  40 mg Intravenous BID   insulin aspart  0-5 Units Subcutaneous QHS   insulin aspart  0-9 Units Subcutaneous TID WC   latanoprost  1 drop Both Eyes QHS   levothyroxine  75 mcg Oral Q0600   metFORMIN  500 mg Oral Q breakfast   metoprolol tartrate  25 mg Oral TID   polyethylene glycol  17 g Oral BID   potassium chloride  40 mEq Oral BID   rosuvastatin  20 mg Oral Daily   timolol  1 drop Both Eyes Daily   traZODone  100 mg Oral QHS   Continuous Infusions:  diltiazem (CARDIZEM) infusion 10 mg/hr (01/11/21 1722)   PRN Meds:.albuterol, ondansetron **OR** ondansetron (ZOFRAN)  IV   Anti-infectives (From admission, onward)    None         Subjective: [name] today has no fevers, no emesis,  [name],   -Dyspnea and palpitations and dizziness improving No fever  Or chills  No Nausea, Vomiting or Diarrhea -daughter visited today Tachycardia persist----still attempting to wean down IV Cardizem  Objective: Vitals:   01/11/21 1100 01/11/21 1118 01/11/21 1300 01/11/21 1530  BP: (!) 96/41  114/74 (!) 123/48  Pulse: (!) 58   99  Resp: (!) 21   13  Temp:  97.9 F (36.6 C)    TempSrc:  Oral    SpO2: 97%   98%  Weight:      Height:        Intake/Output Summary (Last 24 hours) at 01/11/2021 1821 Last data filed at 01/11/2021 1416 Gross per 24 hour  Intake 477.56 ml  Output 425 ml  Net 52.56 ml   Filed Weights   01/09/21 1336 01/10/21 0342 01/11/21 0427  Weight: 74.4 kg 76.6 kg 77 kg    Physical Exam  Gen:- Awake Alert, in no acute distress HEENT:- [place].AT, No sclera icterus Neck-Supple Neck,No JVD,.  Lungs-  CTAB , fair symmetrical air movement CV- S1, S2 normal, irregularly irregular and tachycardic Abd-  +ve B.Sounds, Abd Soft, No tenderness,    Extremity/Skin:- No  edema, pedal pulses present  Psych-affect is appropriate, oriented x3 Neuro-no new focal deficits, no tremors  Data Reviewed: I have personally reviewed following labs and imaging studies  CBC: Recent Labs  Lab 01/09/21 1357 01/10/21 0500  WBC 7.9 7.7  NEUTROABS 5.5  --   HGB 12.1 11.1*  HCT 36.2 33.9*  MCV 90.7 90.9  PLT 194 947   Basic Metabolic Panel: Recent Labs  Lab 01/09/21 1357 01/10/21 0500 01/11/21 0402  NA 135 136 138  K 4.0 3.6 3.9  CL 102 102 103  CO2 24 27 28   GLUCOSE 252* 209* 166*  BUN 15 15 14   CREATININE 0.94 0.87 0.76  CALCIUM 9.4 9.0 9.3   GFR: Estimated Creatinine Clearance: 56.3 mL/min (by C-G formula based on SCr of 0.76 mg/dL). Liver Function Tests: Recent Labs  Lab 01/09/21 1357  AST 29  ALT 74*  ALKPHOS 90   BILITOT 0.3  PROT 6.6  ALBUMIN 4.0   No results for input(s): LIPASE, AMYLASE in the last 168 hours. No results for input(s): AMMONIA in the last 168 hours. Coagulation Profile: No results for input(s): INR, PROTIME in the last 168 hours. Cardiac Enzymes: No results for input(s): CKTOTAL, CKMB, CKMBINDEX, TROPONINI in the last 168 hours. BNP (last 3 results) No results for input(s): PROBNP in the last 8760 hours. HbA1C: Recent Labs    01/10/21 0500  HGBA1C 6.6*  CBG: Recent Labs  Lab 01/10/21 1554 01/10/21 2033 01/11/21 0746 01/11/21 1119 01/11/21 1608  GLUCAP 127* 159* 125* 132* 129*   Lipid Profile: No results for input(s): CHOL, HDL, LDLCALC, TRIG, CHOLHDL, LDLDIRECT in the last 72 hours. Thyroid Function Tests: Recent Labs    01/10/21 1351  TSH 1.995   Anemia Panel: No results for input(s): VITAMINB12, FOLATE, FERRITIN, TIBC, IRON, RETICCTPCT in the last 72 hours. Urine analysis:    Component Value Date/Time   COLORURINE STRAW (A) 01/09/2021 2032   APPEARANCEUR CLEAR 01/09/2021 2032   LABSPEC 1.005 01/09/2021 2032   PHURINE 6.0 01/09/2021 2032   GLUCOSEU NEGATIVE 01/09/2021 2032   HGBUR NEGATIVE 01/09/2021 2032   BILIRUBINUR NEGATIVE 01/09/2021 2032   KETONESUR NEGATIVE 01/09/2021 2032   PROTEINUR NEGATIVE 01/09/2021 2032   UROBILINOGEN 0.2 04/12/2010 1758   NITRITE NEGATIVE 01/09/2021 2032   LEUKOCYTESUR NEGATIVE 01/09/2021 2032   Sepsis Labs: @LABRCNTIP (procalcitonin:4,lacticidven:4)  ) Recent Results (from the past 240 hour(s))  Resp Panel by RT-PCR (Flu A&B, Covid) Nasopharyngeal Swab     Status: None   Collection Time: 01/09/21  1:55 PM   Specimen: Nasopharyngeal Swab; Nasopharyngeal(NP) swabs in vial transport medium  Result Value Ref Range Status   SARS Coronavirus 2 by RT PCR NEGATIVE NEGATIVE Final    Comment: (NOTE) SARS-CoV-2 target nucleic acids are NOT DETECTED.  The SARS-CoV-2 RNA is generally detectable in upper  respiratory specimens during the acute phase of infection. The lowest concentration of SARS-CoV-2 viral copies this assay can detect is 138 copies/mL. A negative result does not preclude SARS-Cov-2 infection and should not be used as the sole basis for treatment or other patient management decisions. A negative result may occur with  improper specimen collection/handling, submission of specimen other than nasopharyngeal swab, presence of viral mutation(s) within the areas targeted by this assay, and inadequate number of viral copies(<138 copies/mL). A negative result must be combined with clinical observations, patient history, and epidemiological information. The expected result is Negative.  Fact Sheet for Patients:  EntrepreneurPulse.com.au  Fact Sheet for Healthcare Providers:  IncredibleEmployment.be  This test is no t yet approved or cleared by the [place] FDA and  has been authorized for detection and/or diagnosis of SARS-CoV-2 by FDA under an Emergency Use Authorization (EUA). This EUA will remain  in effect (meaning this test can be used) for the duration of the COVID-19 declaration under Section 564(b)(1) of the Act, 21 U.S.C.section 360bbb-3(b)(1), unless the authorization is terminated  or revoked sooner.       Influenza A by PCR NEGATIVE NEGATIVE Final   Influenza B by PCR NEGATIVE NEGATIVE Final    Comment: (NOTE) The Xpert Xpress SARS-CoV-2/FLU/RSV plus assay is intended as an aid in the diagnosis of influenza from Nasopharyngeal swab specimens and should not be used as a sole basis for treatment. Nasal washings and aspirates are unacceptable for Xpert Xpress SARS-CoV-2/FLU/RSV testing.  Fact Sheet for Patients: EntrepreneurPulse.com.au  Fact Sheet for Healthcare Providers: IncredibleEmployment.be  This test is not yet approved or cleared by the [place] FDA and has been  authorized for detection and/or diagnosis of SARS-CoV-2 by FDA under an Emergency Use Authorization (EUA). This EUA will remain in effect (meaning this test can be used) for the duration of the COVID-19 declaration under Section 564(b)(1) of the Act, 21 U.S.C. section 360bbb-3(b)(1), unless the authorization is terminated or revoked.  Performed at [facility], [address] [place],  [address]   MRSA Next Gen by PCR, Nasal  Status: None   Collection Time: 01/10/21  3:51 PM   Specimen: Nasal Mucosa; Nasal Swab  Result Value Ref Range Status   MRSA by PCR Next Gen NOT DETECTED NOT DETECTED Final    Comment: (NOTE) The GeneXpert MRSA Assay (FDA approved for NASAL specimens only), is one component of a comprehensive MRSA colonization surveillance program. It is not intended to diagnose MRSA infection nor to guide or monitor treatment for MRSA infections. Test performance is not FDA approved in patients less than 4 years old. Performed at [facility], [address] [place], [place] [address]      Radiology Studies: ECHOCARDIOGRAM COMPLETE  Result Date: 01/10/2021    ECHOCARDIOGRAM REPORT   Patient Name:   [name] Date of Exam: 01/10/2021 Medical Rec #:  [id number]      Height:       64.0 in Accession #:    [id number]     Weight:       168.9 lb Date of Birth:  1941/02/17      BSA:          1.821 m Patient Age:    31 years       BP:           145/80 mmHg Patient Gender: F              HR:           99 bpm. Exam Location:  [facility] Procedure: 2D Echo, Cardiac Doppler and Color Doppler Indications:    CHF-Acute Systolic  History:        Patient has prior history of Echocardiogram examinations, most                 recent 03/10/2020. CHF, Previous Myocardial Infarction and CAD,                 Arrythmias:Atrial Fibrillation, Signs/Symptoms:Chest Pain; Risk                 Factors:Hypertension, Diabetes and Dyslipidemia.  Sonographer:    Wenda Low Referring Phys: [place]  1. Left ventricular ejection fraction, by estimation, is 55 to 60%. The left ventricle has normal function. The left ventricle has no regional wall motion abnormalities. There is mild left ventricular hypertrophy. Left ventricular diastolic parameters are indeterminate.  2. Right ventricular systolic function is normal. The right ventricular size is normal. There is moderately elevated pulmonary artery systolic pressure.  3. Left atrial size was mildly dilated.  4. The mitral valve is normal in structure. No evidence of mitral valve regurgitation. No evidence of mitral stenosis. Moderate mitral annular calcification.  5. The tricuspid valve is abnormal. Tricuspid valve regurgitation is moderate.  6. The aortic valve is tricuspid. There is moderate calcification of the aortic valve. There is moderate thickening of the aortic valve. Aortic valve regurgitation is mild. No aortic stenosis is present.  7. The inferior vena cava is dilated in size with >50% respiratory variability, suggesting right atrial pressure of 8 mmHg. FINDINGS  Left Ventricle: Left ventricular ejection fraction, by estimation, is 55 to 60%. The left ventricle has normal function. The left ventricle has no regional wall motion abnormalities. The left ventricular internal cavity size was normal in size. There is  mild left ventricular hypertrophy. Left ventricular diastolic parameters are indeterminate. Right Ventricle: The right ventricular size is normal. No increase in right ventricular wall thickness. Right ventricular systolic function is normal. There is moderately elevated pulmonary  artery systolic pressure. The tricuspid regurgitant velocity is 3.05 m/s, and with an assumed right atrial pressure of 8 mmHg, the estimated right ventricular systolic pressure is 41.9 mmHg. Left Atrium: Left atrial size was mildly dilated. Right Atrium: Right atrial size was normal in size. Pericardium: There is no evidence of  pericardial effusion. Mitral Valve: The mitral valve is normal in structure. There is mild thickening of the mitral valve leaflet(s). There is mild calcification of the mitral valve leaflet(s). Moderate mitral annular calcification. No evidence of mitral valve regurgitation. No evidence of mitral valve stenosis. MV peak gradient, 9.4 mmHg. The mean mitral valve gradient is 3.0 mmHg. Tricuspid Valve: The tricuspid valve is abnormal. Tricuspid valve regurgitation is moderate . No evidence of tricuspid stenosis. Aortic Valve: The aortic valve is tricuspid. There is moderate calcification of the aortic valve. There is moderate thickening of the aortic valve. There is moderate aortic valve annular calcification. Aortic valve regurgitation is mild. No aortic stenosis is present. Aortic valve mean gradient measures 2.0 mmHg. Aortic valve peak gradient measures 3.5 mmHg. Aortic valve area, by VTI measures 2.24 cm. Pulmonic Valve: The pulmonic valve was not well visualized. Pulmonic valve regurgitation is trivial. No evidence of pulmonic stenosis. Aorta: The aortic root is normal in size and structure. Venous: The inferior vena cava is dilated in size with greater than 50% respiratory variability, suggesting right atrial pressure of 8 mmHg. IAS/Shunts: No atrial level shunt detected by color flow Doppler.  LEFT VENTRICLE PLAX 2D LVIDd:         3.30 cm   Diastology LVIDs:         2.70 cm   LV e' medial:    7.95 cm/s LV PW:         1.20 cm   LV E/e' medial:  16.7 LV IVS:        1.10 cm   LV e' lateral:   10.90 cm/s LVOT diam:     2.00 cm   LV E/e' lateral: 12.2 LV SV:         46 LV SV Index:   25 LVOT Area:     3.14 cm  RIGHT VENTRICLE RV Basal diam:  3.10 cm RV Mid diam:    2.80 cm RV S prime:     9.49 cm/s LEFT ATRIUM             Index        RIGHT ATRIUM           Index LA diam:        3.70 cm 2.03 cm/m   RA Area:     20.40 cm LA Vol (A2C):   74.5 ml 40.92 ml/m  RA Volume:   56.90 ml  31.25 ml/m LA Vol (A4C):   53.7  ml 29.49 ml/m LA Biplane Vol: 63.8 ml 35.04 ml/m  AORTIC VALVE                    PULMONIC VALVE AV Area (Vmax):    2.60 cm     PV Vmax:       0.95 m/s AV Area (Vmean):   2.72 cm     PV Peak grad:  3.6 mmHg AV Area (VTI):     2.24 cm AV Vmax:           94.00 cm/s AV Vmean:          57.550 cm/s AV VTI:  0.207 m AV Peak Grad:      3.5 mmHg AV Mean Grad:      2.0 mmHg LVOT Vmax:         77.65 cm/s LVOT Vmean:        49.750 cm/s LVOT VTI:          0.147 m LVOT/AV VTI ratio: 0.71  AORTA Ao Root diam: 3.00 cm Ao Asc diam:  2.40 cm MITRAL VALVE                TRICUSPID VALVE MV Area (PHT): 6.54 cm     TR Peak grad:   37.2 mmHg MV Area VTI:   1.72 cm     TR Vmax:        305.00 cm/s MV Peak grad:  9.4 mmHg MV Mean grad:  3.0 mmHg     SHUNTS MV Vmax:       1.53 m/s     Systemic VTI:  0.15 m MV Vmean:      74.2 cm/s    Systemic Diam: 2.00 cm MV Decel Time: 116 msec MV E velocity: 133.00 cm/s Carlyle Dolly MD Electronically signed by Carlyle Dolly MD Signature Date/Time: 01/10/2021/10:35:37 AM    Final      Scheduled Meds:  apixaban  5 mg Oral BID   Chlorhexidine Gluconate Cloth  6 each Topical Daily   clopidogrel  75 mg Oral Q breakfast   diclofenac Sodium  2 g Topical QID   furosemide  40 mg Intravenous BID   insulin aspart  0-5 Units Subcutaneous QHS   insulin aspart  0-9 Units Subcutaneous TID WC   latanoprost  1 drop Both Eyes QHS   levothyroxine  75 mcg Oral Q0600   metFORMIN  500 mg Oral Q breakfast   metoprolol tartrate  25 mg Oral TID   polyethylene glycol  17 g Oral BID   potassium chloride  40 mEq Oral BID   rosuvastatin  20 mg Oral Daily   timolol  1 drop Both Eyes Daily   traZODone  100 mg Oral QHS   Continuous Infusions:  diltiazem (CARDIZEM) infusion 10 mg/hr (01/11/21 1722)     LOS: 2 days   Roxan Hockey M.D on 01/11/2021 at 6:21 PM  Go to [email or web address] - for contact info  [facility] - Office  [phone]  If 7PM-7AM, please contact  night-coverage [email or web address] Password [facility] 01/11/2021, 6:21 PM

## 2021-01-12 DIAGNOSIS — I4891 Unspecified atrial fibrillation: Secondary | ICD-10-CM | POA: Diagnosis not present

## 2021-01-12 LAB — GLUCOSE, CAPILLARY
Glucose-Capillary: 122 mg/dL — ABNORMAL HIGH (ref 70–99)
Glucose-Capillary: 132 mg/dL — ABNORMAL HIGH (ref 70–99)

## 2021-01-12 LAB — BASIC METABOLIC PANEL
Anion gap: 6 (ref 5–15)
BUN: 12 mg/dL (ref 8–23)
CO2: 28 mmol/L (ref 22–32)
Calcium: 9.3 mg/dL (ref 8.9–10.3)
Chloride: 105 mmol/L (ref 98–111)
Creatinine, Ser: 0.86 mg/dL (ref 0.44–1.00)
GFR, Estimated: >60 mL/min (ref >60)
Glucose, Bld: 120 mg/dL — ABNORMAL HIGH (ref 70–99)
Potassium: 4.4 mmol/L (ref 3.5–5.1)
Sodium: 139 mmol/L (ref 135–145)

## 2021-01-12 LAB — CBC
HCT: 34.5 % — ABNORMAL LOW (ref 36.0–46.0)
Hemoglobin: 11.2 g/dL — ABNORMAL LOW (ref 12.0–15.0)
MCH: 29.3 pg (ref 26.0–34.0)
MCHC: 32.5 g/dL (ref 30.0–36.0)
MCV: 90.3 fL (ref 80.0–100.0)
Platelets: 175 10*3/uL (ref 150–400)
RBC: 3.82 MIL/uL — ABNORMAL LOW (ref 3.87–5.11)
RDW: 12.9 % (ref 11.5–15.5)
WBC: 6.6 10*3/uL (ref 4.0–10.5)
nRBC: 0 % (ref 0.0–0.2)

## 2021-01-12 MED ORDER — LOSARTAN POTASSIUM 50 MG PO TABS: 50.0000 mg | ORAL_TABLET | Freq: Every day | ORAL | 3 refills | Status: DC

## 2021-01-12 MED ORDER — DILTIAZEM HCL ER COATED BEADS 300 MG PO CP24
300.0000 mg | ORAL_CAPSULE | Freq: Every day | ORAL | Status: DC
  Administered 2021-01-12: 300 mg via ORAL
  Filled 2021-01-12: qty 1

## 2021-01-12 MED ORDER — FUROSEMIDE 20 MG PO TABS: 20.0000 mg | ORAL_TABLET | Freq: Every day | ORAL | 0 refills | Status: DC

## 2021-01-12 MED ORDER — DILTIAZEM HCL ER COATED BEADS 300 MG PO CP24: 300.0000 mg | ORAL_CAPSULE | Freq: Every day | ORAL | 3 refills | Status: DC

## 2021-01-12 MED ORDER — METOPROLOL SUCCINATE ER 100 MG PO TB24: 100.0000 mg | ORAL_TABLET | Freq: Every day | ORAL | 11 refills | Status: DC

## 2021-01-12 MED ORDER — VENTOLIN HFA 108 (90 BASE) MCG/ACT IN AERS
1.0000 | INHALATION_SPRAY | RESPIRATORY_TRACT | 1 refills | Status: AC | PRN
Start: 2021-01-12 — End: ?

## 2021-01-12 MED ORDER — LEVOTHYROXINE SODIUM 75 MCG PO TABS: 75.0000 ug | ORAL_TABLET | Freq: Every day | ORAL | 3 refills | Status: DC

## 2021-01-12 MED ORDER — METFORMIN HCL ER 500 MG PO TB24: 500.0000 mg | ORAL_TABLET | Freq: Every day | ORAL | 4 refills | Status: DC

## 2021-01-12 MED ORDER — POTASSIUM CHLORIDE ER 10 MEQ PO TBCR: 10.0000 meq | EXTENDED_RELEASE_TABLET | Freq: Every day | ORAL | 0 refills | Status: DC

## 2021-01-12 NOTE — Plan of Care (Signed)

## 2021-01-12 NOTE — TOC Transition Note (Signed)
Transition of Care [facility]) - CM/SW Discharge Note   Patient Details  Name: [name] MRN: [id number] Date of Birth: September 17, 1940  [facility] CM/SW Contact:  Shade Flood, LCSW Phone Number: 01/12/2021, 10:45 AM   Clinical Narrative:     Pt stable for dc today per MD. Plan is for dc home. There are no TOC needs identified for dc.  Final next level of care: Home/Self Care Barriers to Discharge: Barriers Resolved   Patient Goals and CMS Choice Patient states their goals for this hospitalization and ongoing recovery are:: go home      Discharge Placement                       Discharge Plan and Services In-house Referral: Clinical Social Work                                   Social Determinants of Health (SDOH) Interventions     Readmission Risk Interventions Readmission Risk Prevention Plan 01/10/2021  Medication Screening Complete  Transportation Screening Complete  Some recent data might be hidden

## 2021-01-12 NOTE — Discharge Summary (Signed)
[name], is a 80 y.o. female  DOB 10/02/1940  MRN [id number].  Admission date:  01/09/2021  Admitting Physician  No admitting provider for patient encounter.  Discharge Date:  01/12/2021   Primary MD  Ivy Lynn, NP  Recommendations for primary care physician for things to follow:   1)Very low-salt diet advised 2)Weigh yourself daily, call if you gain more than 3 pounds in 1 day or more than 5 pounds in 1 week as your diuretic medications may need to be adjusted 3)Avoid ibuprofen/Advil/Aleve/Motrin/Goody Powders/Naproxen/BC powders/Meloxicam/Diclofenac/Indomethacin and other Nonsteroidal anti-inflammatory medications as these will make you more likely to bleed and can cause stomach ulcers, can also cause Kidney problems.  4) continue Plavix/clopidogrel through the end of January 2023 5) please take Lasix and potassium as prescribed for the next 10 days 6)Your Cardizem CD has increased to 300 mg daily and your metoprolol has been changed to Toprol-XL 100 mg once daily to achieve better control for atrial fibrillation   Admission Diagnosis  SOB (shortness of breath) [R06.02] Atrial fibrillation with RVR (HCC) [I48.91]   Discharge Diagnosis  SOB (shortness of breath) [R06.02] Atrial fibrillation with RVR ([place]) [I48.91]    Principal Problem:   Atrial fibrillation with RVR (HCC) Active Problems:   Mixed hyperlipidemia   Essential hypertension   Coronary atherosclerosis of native coronary artery   Diabetes mellitus ([place])   Acute CHF (congestive heart failure) ([place])      Past Medical History:  Diagnosis Date   Anxiety    Arthritis    Bulging lumbar disc    Chronic lower back pain    Coronary atherosclerosis of native coronary artery    DES LAD 02/2008, DES LAD 02/2015, DES ramus and DES x2 mid RCA 02/2020   Depression    Dyslipidemia    Essential hypertension    Facial numbness     Frequent headaches    GERD (gastroesophageal reflux disease)    Glaucoma    Heart attack ([place])    Insomnia    Paroxysmal atrial fibrillation ([place])    Diagnosed October 2019   PSVT (paroxysmal supraventricular tachycardia) (HCC)    Refusal of blood transfusions as patient is Jehovah's Witness    Thyroid disease    Type 2 diabetes mellitus ([place])    Borderline   Vitamin D deficiency     Past Surgical History:  Procedure Laterality Date   CARDIAC CATHETERIZATION N/A 02/23/2015   Procedure: Left Heart Cath and Coronary Angiography;  Surgeon: Sherren Mocha, MD;  Location: [facility];  Service: Cardiovascular;  Laterality: N/A;   CARDIAC CATHETERIZATION N/A 02/23/2015   Procedure: Coronary Stent Intervention;  Surgeon: Sherren Mocha, MD;  Location: [facility];  Service: Cardiovascular;  Laterality: N/A;   CARDIAC CATHETERIZATION N/A 01/04/2016   Procedure: Left Heart Cath and Coronary Angiography;  Surgeon: Peter M Martinique, MD;  Location: [facility];  Service: Cardiovascular;  Laterality: N/A;   CATARACT EXTRACTION     CORONARY ANGIOPLASTY  02/23/2015  CORONARY ANGIOPLASTY WITH STENT PLACEMENT  2009   CORONARY STENT INTERVENTION N/A 03/09/2020   Procedure: CORONARY STENT INTERVENTION;  Surgeon: Jettie Booze, MD;  Location: [facility];  Service: Cardiovascular;  Laterality: N/A;   DILATION AND CURETTAGE OF UTERUS     implantable loop recorder placement  04/01/2020   Medtronic Reveal Linq model LNQ 22 [phone] [id number]) implantable loop recorder    LEFT HEART CATH AND CORONARY ANGIOGRAPHY N/A 03/09/2020   Procedure: LEFT HEART CATH AND CORONARY ANGIOGRAPHY;  Surgeon: Jettie Booze, MD;  Location: [facility];  Service: Cardiovascular;  Laterality: N/A;   TUBAL LIGATION         HPI  from the history and physical done on the day of admission:      Patient Coming From: home   Chief Complaint: SOB   HPI: [name] is a 80 y.o. female with  a history of coronary artery disease with multiple stents, hypertension, atrial fibrillation on chronic anticoagulation, type 2 diabetes, hypothyroidism, GERD.  Patient seen for increasing shortness of breath that is worse with ambulation since Wednesday.  Her symptoms have been worsening.  Denies cough, fevers, wheezing.  She has noticed increased orthopnea and that her heart was beating faster than normal.  She has been compliant with her medications.  There have been no recent changes to her medications   Emergency Department Course: Patient noted to be in A. fib with RVR.  Patient given bolus of Cardizem which mildly improved her heart rate for a brief period of time, then the patient became tachycardic again.  BNP elevated to 1200.  CK slightly elevated, but troponins were normal.  Checks x-ray was unremarkable.  Upon my request, Cardizem drip started, Lasix 40 mg given.   Review of Systems:    Pt denies any fevers, chills, nausea, vomiting, diarrhea, constipation, abdominal pain, headache, vision changes, lightheadedness, dizziness, melena, rectal bleeding.  Review of systems are otherwise negative     Hospital Course:     Brief Narrative:   80 y.o. female with a history of coronary artery disease with multiple stents, HTN, PAFib  on chronic anticoagulation, DM2, hypothyroidism, GERD    Assessment & Plan:   Principal Problem:   Atrial fibrillation with RVR ([place]) Active Problems:   Mixed hyperlipidemia   Essential hypertension   Coronary atherosclerosis of native coronary artery   Diabetes mellitus ([place])   Acute CHF (congestive heart failure) ([place])     1)PAFib--now with RVR - Pt has ILR in place with followed by Dr. Rayann Heman.  Most recent interrogation showed 5.8% AF burden down from 26%. -Rate control  improved with IV Cardizem and p.o. metoprolol -Repeat echo shows EF of 55 to 60%,moderately elevated pulmonary artery systolic pressure.  -Continue Eliquis for stroke prophylaxis  as CHA2DS2-VASc is 6 -Discharge on increased dose of p.o. Cardizem and metoprolol   2)CAD status post DES to the LAD in 2010, DES to the LAD in 2017, and DES to the ramus intermedius as well as DES x2 to the RCA in January 2022--- --- Continue Plavix  through end of January 2023 -Continue metoprolol and Crestor, LDL 37 Even if her lipid panel is within desired limits, patient should still take -Crestor for it's Pleiotropic effects (beyond cholesterol lowering benefits) -No aspirin as patient is already on Eliquis and Plavix   3)HFpEF--- history of chronic diastolic dysfunction CHF, echo from 03/10/2020 showed EF 55 to 60% with grade 2 diastolic dysfunction -Repeat echo as noted  above -Improved with IV Lasix   4)DM2-A1c 6.6 reflecting fair diabetic control PTA -Continue metformin    5)Hypothyroidism-continue levothyroxine  TSH  -wnl   Disposition--Home   Disposition: The patient is from: Home              Anticipated d/c is to: Home                Code Status :  -  Code Status: Full Code    Family Communication:    NA (patient is alert, awake and coherent)    Consults  :  Na  Discharge Condition: stable  Follow UP--PCP  Diet and Activity recommendation:  As advised  Discharge Instructions    Discharge Instructions     Call MD for:  difficulty breathing, headache or visual disturbances   Complete by: As directed    Call MD for:  persistant dizziness or light-headedness   Complete by: As directed    Call MD for:  persistant nausea and vomiting   Complete by: As directed    Call MD for:  temperature >100.4   Complete by: As directed    Diet - low sodium heart healthy   Complete by: As directed    Diet Carb Modified   Complete by: As directed    Discharge instructions   Complete by: As directed    1)Very low-salt diet advised 2)Weigh yourself daily, call if you gain more than 3 pounds in 1 day or more than 5 pounds in 1 week as your diuretic medications may need to  be adjusted 3)Avoid ibuprofen/Advil/Aleve/Motrin/Goody Powders/Naproxen/BC powders/Meloxicam/Diclofenac/Indomethacin and other Nonsteroidal anti-inflammatory medications as these will make you more likely to bleed and can cause stomach ulcers, can also cause Kidney problems.  4) continue Plavix/clopidogrel through the end of January 2023 5) please take Lasix and potassium as prescribed for the next 10 days 6)Your Cardizem CD has increased to 300 mg daily and your metoprolol has been changed to Toprol-XL 100 mg once daily to achieve better control for atrial fibrillation   Increase activity slowly   Complete by: As directed          Discharge Medications     Allergies as of 01/12/2021   No Known Allergies      Medication List     STOP taking these medications    metoprolol tartrate 100 MG tablet Commonly known as: LOPRESSOR       TAKE these medications    apixaban 5 MG Tabs tablet Commonly known as: Eliquis Take 1 tablet (5 mg total) by mouth 2 (two) times daily.   bimatoprost 0.01 % Soln Commonly known as: LUMIGAN Place 1 drop into both eyes at bedtime.   ciclopirox 8 % solution Commonly known as: PENLAC Apply 1 application topically at bedtime.   clopidogrel 75 MG tablet Commonly known as: PLAVIX Take 1 tablet (75 mg total) by mouth daily with breakfast.   diclofenac Sodium 1 % Gel Commonly known as: Voltaren Apply 2 g topically 4 (four) times daily.   diltiazem 300 MG 24 hr capsule Commonly known as: CARDIZEM CD Take 1 capsule (300 mg total) by mouth daily. Start taking on: January 13, 2021 What changed:  medication strength how much to take   furosemide 20 MG tablet Commonly known as: Lasix Take 1 tablet (20 mg total) by mouth daily for 10 days.   Garlic 732 MG Tabs Take 1 tablet by mouth daily.   HYDROcodone-acetaminophen 5-325 MG tablet Commonly known as: Norco  Take 1 tablet by mouth every 6 (six) hours as needed for moderate pain.    levothyroxine 75 MCG tablet Commonly known as: SYNTHROID Take 1 tablet (75 mcg total) by mouth daily before breakfast.   losartan 50 MG tablet Commonly known as: COZAAR Take 1 tablet (50 mg total) by mouth daily.   MAGNESIUM CARBONATE PO Take 1 tablet by mouth daily. 400 mg   metFORMIN 500 MG 24 hr tablet Commonly known as: GLUCOPHAGE-XR Take 1 tablet (500 mg total) by mouth daily with breakfast. What changed: when to take this   metoprolol succinate 100 MG 24 hr tablet Commonly known as: Toprol XL Take 1 tablet (100 mg total) by mouth daily. Take with or immediately following a meal.   nitroGLYCERIN 0.4 MG SL tablet Commonly known as: NITROSTAT Place 0.4 mg under the tongue every 5 (five) minutes as needed for chest pain.   potassium chloride 10 MEQ tablet Commonly known as: KLOR-CON Take 1 tablet (10 mEq total) by mouth daily for 10 days. Take While taking Lasix/furosemide   promethazine 25 MG tablet Commonly known as: PHENERGAN 1 tablet as needed.   rosuvastatin 20 MG tablet Commonly known as: CRESTOR TAKE ONE (1) TABLET BY MOUTH EVERY DAY   timolol 0.5 % ophthalmic solution Commonly known as: TIMOPTIC Place 1 drop into both eyes daily.   traZODone 100 MG tablet Commonly known as: DESYREL TAKE ONE TABLET BY MOUTH AT BEDTIME   TUMS PO Take 2 tablets by mouth 4 (four) times daily as needed (for acid reflux/indigestion).   TURMERIC PO Take 1 tablet by mouth daily.   Ventolin HFA 108 (90 Base) MCG/ACT inhaler Generic drug: albuterol Inhale 1-2 puffs into the lungs every 4 (four) hours as needed for shortness of breath.   VITAMIN B 12 PO Take 1 tablet by mouth daily.   vitamin C 500 MG tablet Commonly known as: ASCORBIC ACID Take 500 mg by mouth daily.   Vitamin D 400 units capsule Take 400 Units by mouth daily.        Major procedures and Radiology Reports - PLEASE review detailed and final reports for all details, in brief -   DG Chest Port 1  View  Result Date: 01/09/2021 CLINICAL DATA:  Shortness of breath with exertion with chest pain, cough and vomiting since Thursday. EXAM: PORTABLE CHEST 1 VIEW COMPARISON:  03/08/2020 FINDINGS: Loop recorder projects over the left chest. Lungs are adequately inflated without focal airspace consolidation or effusion. Coronary artery stent unchanged projecting over the left heart. Mild stable cardiomegaly. Remainder of the exam is unchanged. IMPRESSION: No active disease. Electronically Signed   By: Marin Olp M.D.   On: 01/09/2021 14:47   ECHOCARDIOGRAM COMPLETE  Result Date: 01/10/2021    ECHOCARDIOGRAM REPORT   Patient Name:   [name] Date of Exam: 01/10/2021 Medical Rec #:  [id number]      Height:       64.0 in Accession #:    [id number]     Weight:       168.9 lb Date of Birth:  Jun 12, 1940      BSA:          1.821 m Patient Age:    63 years       BP:           145/80 mmHg Patient Gender: F              HR:  99 bpm. Exam Location:  [facility] Procedure: 2D Echo, Cardiac Doppler and Color Doppler Indications:    CHF-Acute Systolic  History:        Patient has prior history of Echocardiogram examinations, most                 recent 03/10/2020. CHF, Previous Myocardial Infarction and CAD,                 Arrythmias:Atrial Fibrillation, Signs/Symptoms:Chest Pain; Risk                 Factors:Hypertension, Diabetes and Dyslipidemia.  Sonographer:    Wenda Low Referring Phys: [place]  1. Left ventricular ejection fraction, by estimation, is 55 to 60%. The left ventricle has normal function. The left ventricle has no regional wall motion abnormalities. There is mild left ventricular hypertrophy. Left ventricular diastolic parameters are indeterminate.  2. Right ventricular systolic function is normal. The right ventricular size is normal. There is moderately elevated pulmonary artery systolic pressure.  3. Left atrial size was mildly dilated.  4. The mitral valve  is normal in structure. No evidence of mitral valve regurgitation. No evidence of mitral stenosis. Moderate mitral annular calcification.  5. The tricuspid valve is abnormal. Tricuspid valve regurgitation is moderate.  6. The aortic valve is tricuspid. There is moderate calcification of the aortic valve. There is moderate thickening of the aortic valve. Aortic valve regurgitation is mild. No aortic stenosis is present.  7. The inferior vena cava is dilated in size with >50% respiratory variability, suggesting right atrial pressure of 8 mmHg. FINDINGS  Left Ventricle: Left ventricular ejection fraction, by estimation, is 55 to 60%. The left ventricle has normal function. The left ventricle has no regional wall motion abnormalities. The left ventricular internal cavity size was normal in size. There is  mild left ventricular hypertrophy. Left ventricular diastolic parameters are indeterminate. Right Ventricle: The right ventricular size is normal. No increase in right ventricular wall thickness. Right ventricular systolic function is normal. There is moderately elevated pulmonary artery systolic pressure. The tricuspid regurgitant velocity is 3.05 m/s, and with an assumed right atrial pressure of 8 mmHg, the estimated right ventricular systolic pressure is 25.8 mmHg. Left Atrium: Left atrial size was mildly dilated. Right Atrium: Right atrial size was normal in size. Pericardium: There is no evidence of pericardial effusion. Mitral Valve: The mitral valve is normal in structure. There is mild thickening of the mitral valve leaflet(s). There is mild calcification of the mitral valve leaflet(s). Moderate mitral annular calcification. No evidence of mitral valve regurgitation. No evidence of mitral valve stenosis. MV peak gradient, 9.4 mmHg. The mean mitral valve gradient is 3.0 mmHg. Tricuspid Valve: The tricuspid valve is abnormal. Tricuspid valve regurgitation is moderate . No evidence of tricuspid stenosis. Aortic  Valve: The aortic valve is tricuspid. There is moderate calcification of the aortic valve. There is moderate thickening of the aortic valve. There is moderate aortic valve annular calcification. Aortic valve regurgitation is mild. No aortic stenosis is present. Aortic valve mean gradient measures 2.0 mmHg. Aortic valve peak gradient measures 3.5 mmHg. Aortic valve area, by VTI measures 2.24 cm. Pulmonic Valve: The pulmonic valve was not well visualized. Pulmonic valve regurgitation is trivial. No evidence of pulmonic stenosis. Aorta: The aortic root is normal in size and structure. Venous: The inferior vena cava is dilated in size with greater than 50% respiratory variability, suggesting right atrial pressure of 8 mmHg. IAS/Shunts: No atrial  level shunt detected by color flow Doppler.  LEFT VENTRICLE PLAX 2D LVIDd:         3.30 cm   Diastology LVIDs:         2.70 cm   LV e' medial:    7.95 cm/s LV PW:         1.20 cm   LV E/e' medial:  16.7 LV IVS:        1.10 cm   LV e' lateral:   10.90 cm/s LVOT diam:     2.00 cm   LV E/e' lateral: 12.2 LV SV:         46 LV SV Index:   25 LVOT Area:     3.14 cm  RIGHT VENTRICLE RV Basal diam:  3.10 cm RV Mid diam:    2.80 cm RV S prime:     9.49 cm/s LEFT ATRIUM             Index        RIGHT ATRIUM           Index LA diam:        3.70 cm 2.03 cm/m   RA Area:     20.40 cm LA Vol (A2C):   74.5 ml 40.92 ml/m  RA Volume:   56.90 ml  31.25 ml/m LA Vol (A4C):   53.7 ml 29.49 ml/m LA Biplane Vol: 63.8 ml 35.04 ml/m  AORTIC VALVE                    PULMONIC VALVE AV Area (Vmax):    2.60 cm     PV Vmax:       0.95 m/s AV Area (Vmean):   2.72 cm     PV Peak grad:  3.6 mmHg AV Area (VTI):     2.24 cm AV Vmax:           94.00 cm/s AV Vmean:          57.550 cm/s AV VTI:            0.207 m AV Peak Grad:      3.5 mmHg AV Mean Grad:      2.0 mmHg LVOT Vmax:         77.65 cm/s LVOT Vmean:        49.750 cm/s LVOT VTI:          0.147 m LVOT/AV VTI ratio: 0.71  AORTA Ao Root diam: 3.00 cm  Ao Asc diam:  2.40 cm MITRAL VALVE                TRICUSPID VALVE MV Area (PHT): 6.54 cm     TR Peak grad:   37.2 mmHg MV Area VTI:   1.72 cm     TR Vmax:        305.00 cm/s MV Peak grad:  9.4 mmHg MV Mean grad:  3.0 mmHg     SHUNTS MV Vmax:       1.53 m/s     Systemic VTI:  0.15 m MV Vmean:      74.2 cm/s    Systemic Diam: 2.00 cm MV Decel Time: 116 msec MV E velocity: 133.00 cm/s Carlyle Dolly MD Electronically signed by Carlyle Dolly MD Signature Date/Time: 01/10/2021/10:35:37 AM    Final    CUP PACEART REMOTE DEVICE CHECK  Result Date: 12/23/2020 ILR summary report received. Battery status OK. Normal device function. No new symptom, tachy, brady, or pause episodes. 1 new  AF episode duration 45hrs, CVR.  Burden 5.8%, (previous 26%), Eliquis, Diltiazem, Metoprolol prescribed  Monthly summary reports and ROV/PRN LR   Micro Results   Recent Results (from the past 240 hour(s))  Resp Panel by RT-PCR (Flu A&B, Covid) Nasopharyngeal Swab     Status: None   Collection Time: 01/09/21  1:55 PM   Specimen: Nasopharyngeal Swab; Nasopharyngeal(NP) swabs in vial transport medium  Result Value Ref Range Status   SARS Coronavirus 2 by RT PCR NEGATIVE NEGATIVE Final    Comment: (NOTE) SARS-CoV-2 target nucleic acids are NOT DETECTED.  The SARS-CoV-2 RNA is generally detectable in upper respiratory specimens during the acute phase of infection. The lowest concentration of SARS-CoV-2 viral copies this assay can detect is 138 copies/mL. A negative result does not preclude SARS-Cov-2 infection and should not be used as the sole basis for treatment or other patient management decisions. A negative result may occur with  improper specimen collection/handling, submission of specimen other than nasopharyngeal swab, presence of viral mutation(s) within the areas targeted by this assay, and inadequate number of viral copies(<138 copies/mL). A negative result must be combined with clinical observations,  patient history, and epidemiological information. The expected result is Negative.  Fact Sheet for Patients:  EntrepreneurPulse.com.au  Fact Sheet for Healthcare Providers:  IncredibleEmployment.be  This test is no t yet approved or cleared by the [place] FDA and  has been authorized for detection and/or diagnosis of SARS-CoV-2 by FDA under an Emergency Use Authorization (EUA). This EUA will remain  in effect (meaning this test can be used) for the duration of the COVID-19 declaration under Section 564(b)(1) of the Act, 21 U.S.C.section 360bbb-3(b)(1), unless the authorization is terminated  or revoked sooner.       Influenza A by PCR NEGATIVE NEGATIVE Final   Influenza B by PCR NEGATIVE NEGATIVE Final    Comment: (NOTE) The Xpert Xpress SARS-CoV-2/FLU/RSV plus assay is intended as an aid in the diagnosis of influenza from Nasopharyngeal swab specimens and should not be used as a sole basis for treatment. Nasal washings and aspirates are unacceptable for Xpert Xpress SARS-CoV-2/FLU/RSV testing.  Fact Sheet for Patients: EntrepreneurPulse.com.au  Fact Sheet for Healthcare Providers: IncredibleEmployment.be  This test is not yet approved or cleared by the [place] FDA and has been authorized for detection and/or diagnosis of SARS-CoV-2 by FDA under an Emergency Use Authorization (EUA). This EUA will remain in effect (meaning this test can be used) for the duration of the COVID-19 declaration under Section 564(b)(1) of the Act, 21 U.S.C. section 360bbb-3(b)(1), unless the authorization is terminated or revoked.  Performed at [facility], [address], [place], [place] [address]   MRSA Next Gen by PCR, Nasal     Status: None   Collection Time: 01/10/21  3:51 PM   Specimen: Nasal Mucosa; Nasal Swab  Result Value Ref Range Status   MRSA by PCR Next Gen NOT DETECTED NOT DETECTED Final     Comment: (NOTE) The GeneXpert MRSA Assay (FDA approved for NASAL specimens only), is one component of a comprehensive MRSA colonization surveillance program. It is not intended to diagnose MRSA infection nor to guide or monitor treatment for MRSA infections. Test performance is not FDA approved in patients less than 54 years old. Performed at [facility], [address] [place], [place] [address]        Today   Taylor today has no new complaints  No fever  Or chills  No Nausea, Vomiting or Diarrhea        Patient has been seen and examined prior to discharge   Objective   Blood pressure (!) 149/53, pulse 64, temperature (!) 97.5 F (36.4 C), temperature source Oral, resp. rate 17, height 5\' 4"  (1.626 m), weight 75.8 kg, SpO2 97 %.   Intake/Output Summary (Last 24 hours) at 01/12/2021 1212 Last data filed at 01/12/2021 0000 Gross per 24 hour  Intake 114.98 ml  Output 100 ml  Net 14.98 ml   Exam Gen:- Awake Alert, no acute distress  HEENT:- [place].AT, No sclera icterus Neck-Supple Neck,No JVD,.  Lungs-  CTAB , good air movement bilaterally  CV- S1, S2 normal, regular Abd-  +ve B.Sounds, Abd Soft, No tenderness,    Extremity/Skin:- No  edema,   good pulses Psych-affect is appropriate, oriented x3 Neuro-no new focal deficits, no tremors    Data Review   CBC w Diff:  Lab Results  Component Value Date   WBC 6.6 01/12/2021   HGB 11.2 (L) 01/12/2021   HGB 13.4 12/03/2020   HCT 34.5 (L) 01/12/2021   HCT 39.9 12/03/2020   PLT 175 01/12/2021   PLT 228 12/03/2020   LYMPHOPCT 23 01/09/2021   MONOPCT 6 01/09/2021   EOSPCT 1 01/09/2021   BASOPCT 0 01/09/2021    CMP:  Lab Results  Component Value Date   NA 139 01/12/2021   NA 139 12/03/2020   K 4.4 01/12/2021   CL 105 01/12/2021   CO2 28 01/12/2021   BUN 12 01/12/2021   BUN 13 12/03/2020   CREATININE 0.86 01/12/2021   GLU 147 08/19/2019   PROT 6.6 01/09/2021   PROT 6.5 12/03/2020    ALBUMIN 4.0 01/09/2021   ALBUMIN 4.6 12/03/2020   BILITOT 0.3 01/09/2021   BILITOT 0.2 12/03/2020   ALKPHOS 90 01/09/2021   AST 29 01/09/2021   ALT 74 (H) 01/09/2021  .   Total Discharge time is about 33 minutes  Roxan Hockey M.D on 01/12/2021 at 12:12 PM  Go to [email or web address] -  for contact info  [facility] - Office  [phone]

## 2021-01-17 ENCOUNTER — Telehealth: Payer: Self-pay

## 2021-01-17 NOTE — Telephone Encounter (Signed)
Transition Care Management Follow-up Telephone Call Date of discharge and from where: 01/12/21 - [facility] - SOB due to A.Fib How have you been since you were released from the hospital? Mostly better, but still has a bad headache - rates it 8 on 0-10 scale, and her tongue hurts so she isn't eating much Any questions or concerns? Yes - headache and tongue hurting  Items Reviewed: Did the pt receive and understand the discharge instructions provided? Yes  Medications obtained and verified? Yes  Other? No  Any new allergies since your discharge? No  Dietary orders reviewed? Yes Do you have support at home? Yes   Home Care and Equipment/Supplies: Were home health services ordered? no If so, what is the name of the agency? N/a  Has the agency set up a time to come to the patient's home? not applicable Were any new equipment or medical supplies ordered?  No What is the name of the medical supply agency? N/a Were you able to get the supplies/equipment? not applicable Do you have any questions related to the use of the equipment or supplies? No  Functional Questionnaire: (I = Independent and D = Dependent) ADLs: I  Bathing/Dressing- I  Meal Prep- I  Eating- I  Maintaining continence- I  Transferring/Ambulation- I  Managing Meds- I  Follow up appointments reviewed:  PCP Hospital f/u appt confirmed? Yes  Scheduled to see Onyeje on 01/18/21 @ 11:15. [facility] f/u appt confirmed? Yes  Scheduled to see Dr Rayann Heman, Cardiology on 12/9 @ 12. Are transportation arrangements needed? No  If their condition worsens, is the pt aware to call PCP or go to the Emergency Dept.? Yes Was the patient provided with contact information for the PCP's office or ED? Yes Was to pt encouraged to call back with questions or concerns? Yes

## 2021-01-18 ENCOUNTER — Ambulatory Visit (INDEPENDENT_AMBULATORY_CARE_PROVIDER_SITE_OTHER): Payer: Medicare Other | Admitting: Nurse Practitioner

## 2021-01-18 ENCOUNTER — Encounter: Payer: Self-pay | Admitting: Nurse Practitioner

## 2021-01-18 ENCOUNTER — Other Ambulatory Visit: Payer: Self-pay

## 2021-01-18 VITALS — BP 108/57 | HR 65 | Temp 97.7°F | Resp 20 | Ht 64.0 in | Wt 162.0 lb

## 2021-01-18 DIAGNOSIS — R0602 Shortness of breath: Secondary | ICD-10-CM

## 2021-01-18 DIAGNOSIS — I509 Heart failure, unspecified: Secondary | ICD-10-CM | POA: Diagnosis not present

## 2021-01-18 DIAGNOSIS — Z09 Encounter for follow-up examination after completed treatment for conditions other than malignant neoplasm: Secondary | ICD-10-CM | POA: Diagnosis not present

## 2021-01-18 DIAGNOSIS — I4891 Unspecified atrial fibrillation: Secondary | ICD-10-CM

## 2021-01-18 NOTE — Progress Notes (Signed)
Established Patient Office Visit  Subjective:  Patient ID: [name], female    DOB: 1940-03-14  Age: 80 y.o. MRN: [id number]  CC:  Chief Complaint  Patient presents with   [place] follow up - Forestine Na 01/09/21 til 01/12/21. Dx SOB from AFIB     HPI [name] presents for Today's visit was for Transitional Care Management.  The patient was discharged from [facility] on 11/23/222 with a primary diagnosis of shortness of breath to atrial fibrillation.   Contact with the patient and/or caregiver, by a clinical staff member, was made on 01/17/2021 and was documented as a telephone encounter within the EMR.  Through chart review and discussion with the patient I have determined that management of their condition is of moderate complexity.   Completed discharge instructions with medication reconciliation.    Patient was seen in the emergency department on 01/09/2021 with shortness of breath that has been worsening for a few days. She did not have a cough, fever or wheezing on arrival.  Patient also experienced increased orthopnea and increased heart rate.  Patient was noted to be in A. fib with RVR and treated with IV Cardizem.  Patient was admitted to the hospital when heart rate continued to be tachycardic and BNP/CK elevated.  Troponin levels were normal, patient's x-ray was unremarkable, continued Cardizem drip with Lasix 40 mg given. Flu and COVID-19 test came back negative Patient was then stabilized and discharged home after admission with Lasix, and potassium for 10 days.  Past Medical History:  Diagnosis Date   Anxiety    Arthritis    Bulging lumbar disc    Chronic lower back pain    Coronary atherosclerosis of native coronary artery    DES LAD 02/2008, DES LAD 02/2015, DES ramus and DES x2 mid RCA 02/2020   Depression    Dyslipidemia    Essential hypertension    Facial numbness    Frequent headaches    GERD (gastroesophageal reflux disease)     Glaucoma    Heart attack ([place])    Insomnia    Paroxysmal atrial fibrillation [facility])    Diagnosed October 2019   PSVT (paroxysmal supraventricular tachycardia) ([place])    Refusal of blood transfusions as patient is Jehovah's Witness    Thyroid disease    Type 2 diabetes mellitus ([place])    Borderline   Vitamin D deficiency     Past Surgical History:  Procedure Laterality Date   CARDIAC CATHETERIZATION N/A 02/23/2015   Procedure: Left Heart Cath and Coronary Angiography;  Surgeon: Sherren Mocha, MD;  Location: [facility];  Service: Cardiovascular;  Laterality: N/A;   CARDIAC CATHETERIZATION N/A 02/23/2015   Procedure: Coronary Stent Intervention;  Surgeon: Sherren Mocha, MD;  Location: [place] [facility];  Service: Cardiovascular;  Laterality: N/A;   CARDIAC CATHETERIZATION N/A 01/04/2016   Procedure: Left Heart Cath and Coronary Angiography;  Surgeon: Peter M Martinique, MD;  Location: [place] [facility];  Service: Cardiovascular;  Laterality: N/A;   CATARACT EXTRACTION     CORONARY ANGIOPLASTY  02/23/2015   CORONARY ANGIOPLASTY WITH STENT PLACEMENT  2009   CORONARY STENT INTERVENTION N/A 03/09/2020   Procedure: CORONARY STENT INTERVENTION;  Surgeon: Jettie Booze, MD;  Location: [place] [facility];  Service: Cardiovascular;  Laterality: N/A;   DILATION AND CURETTAGE OF UTERUS     implantable loop recorder placement  04/01/2020   Medtronic Reveal Linq model LNQ 22 (  [id number]) implantable loop recorder    LEFT HEART CATH AND CORONARY ANGIOGRAPHY N/A 03/09/2020   Procedure: LEFT HEART CATH AND CORONARY ANGIOGRAPHY;  Surgeon: Jettie Booze, MD;  Location: [place] [facility];  Service: Cardiovascular;  Laterality: N/A;   TUBAL LIGATION      Family History  Problem Relation Age of Onset   Other Mother        "natural causes"   Heart disease Mother    Cancer Father        unsure of type    Social History   Socioeconomic History   Marital status: Widowed    Spouse  name: Not on file   Number of children: 8   Years of education: 10th grade   Highest education level: 10th grade  Occupational History   Occupation: RETIRED  Tobacco Use   Smoking status: Never   Smokeless tobacco: Never  Vaping Use   Vaping Use: Never used  Substance and Sexual Activity   Alcohol use: Yes    Alcohol/week: 1.0 standard drink    Types: 1 Glasses of wine per week    Comment: RARE OCCASION   Drug use: No   Sexual activity: Not Currently  Other Topics Concern   Not on file  Social History Narrative   Lives at home alone.   Right-handed.   No caffeine use.   Social Determinants of Health   Financial Resource Strain: Low Risk    Difficulty of Paying Living Expenses: Not hard at all  Food Insecurity: No Food Insecurity   Worried About Charity fundraiser in the Last Year: Never true   [place] in the Last Year: Never true  Transportation Needs: No Transportation Needs   Lack of Transportation (Medical): No   Lack of Transportation (Non-Medical): No  Physical Activity: Inactive   Days of Exercise per Week: 0 days   Minutes of Exercise per Session: 0 min  Stress: No Stress Concern Present   Feeling of Stress : Not at all  Social Connections: Moderately Integrated   Frequency of Communication with Friends and Family: More than three times a week   Frequency of Social Gatherings with Friends and Family: More than three times a week   Attends Religious Services: More than 4 times per year   Active Member of [employer] or Organizations: Yes   Attends Archivist Meetings: More than 4 times per year   Marital Status: Widowed  Human resources officer Violence: Not At Risk   Fear of Current or Ex-Partner: No   Emotionally Abused: No   Physically Abused: No   Sexually Abused: No    Outpatient Medications Prior to Visit  Medication Sig Dispense Refill   apixaban (ELIQUIS) 5 MG TABS tablet Take 1 tablet (5 mg total) by mouth 2 (two) times daily. 180 tablet 3    Ascorbic Acid (VITAMIN C) 500 MG tablet Take 500 mg by mouth daily.       bimatoprost (LUMIGAN) 0.01 % SOLN Place 1 drop into both eyes at bedtime.     Calcium Carbonate Antacid (TUMS PO) Take 2 tablets by mouth 4 (four) times daily as needed (for acid reflux/indigestion).      Cholecalciferol (VITAMIN D) 400 UNITS capsule Take 400 Units by mouth daily.       ciclopirox (PENLAC) 8 % solution Apply 1 application topically at bedtime.     clopidogrel (PLAVIX) 75 MG tablet Take 1 tablet (75 mg total) by mouth daily with  breakfast. 90 tablet 3   Cyanocobalamin (VITAMIN B 12 PO) Take 1 tablet by mouth daily.     diclofenac Sodium (VOLTAREN) 1 % GEL Apply 2 g topically 4 (four) times daily. 50 g 2   diltiazem (CARDIZEM CD) 300 MG 24 hr capsule Take 1 capsule (300 mg total) by mouth daily. 90 capsule 3   furosemide (LASIX) 20 MG tablet Take 1 tablet (20 mg total) by mouth daily for 10 days. 10 tablet 0   Garlic 097 MG TABS Take 1 tablet by mouth daily.     HYDROcodone-acetaminophen (NORCO) 5-325 MG tablet Take 1 tablet by mouth every 6 (six) hours as needed for moderate pain. 120 tablet 0   levothyroxine (SYNTHROID) 75 MCG tablet Take 1 tablet (75 mcg total) by mouth daily before breakfast. 30 tablet 3   losartan (COZAAR) 50 MG tablet Take 1 tablet (50 mg total) by mouth daily. 90 tablet 3   MAGNESIUM CARBONATE PO Take 1 tablet by mouth daily. 400 mg     metFORMIN (GLUCOPHAGE-XR) 500 MG 24 hr tablet Take 1 tablet (500 mg total) by mouth daily with breakfast. 30 tablet 4   metoprolol succinate (TOPROL XL) 100 MG 24 hr tablet Take 1 tablet (100 mg total) by mouth daily. Take with or immediately following a meal. 30 tablet 11   nitroGLYCERIN (NITROSTAT) 0.4 MG SL tablet Place 0.4 mg under the tongue every 5 (five) minutes as needed for chest pain.     potassium chloride (KLOR-CON) 10 MEQ tablet Take 1 tablet (10 mEq total) by mouth daily for 10 days. Take While taking Lasix/furosemide 10 tablet 0    promethazine (PHENERGAN) 25 MG tablet 1 tablet as needed.     rosuvastatin (CRESTOR) 20 MG tablet TAKE ONE (1) TABLET BY MOUTH EVERY DAY 90 tablet 3   timolol (TIMOPTIC) 0.5 % ophthalmic solution Place 1 drop into both eyes daily.     traZODone (DESYREL) 100 MG tablet TAKE ONE TABLET BY MOUTH AT BEDTIME 90 tablet 0   TURMERIC PO Take 1 tablet by mouth daily.     VENTOLIN HFA 108 (90 Base) MCG/ACT inhaler Inhale 1-2 puffs into the lungs every 4 (four) hours as needed for shortness of breath. 18 g 1   No facility-administered medications prior to visit.    No Known Allergies  ROS Review of Systems  Constitutional: Negative.   HENT: Negative.    Eyes: Negative.   Respiratory:  Negative for cough, chest tightness and shortness of breath.   Cardiovascular:  Negative for chest pain, palpitations and leg swelling.  Gastrointestinal: Negative.   Genitourinary: Negative.   Skin: Negative.  Negative for rash.  All other systems reviewed and are negative.    Objective:    Physical Exam Vitals and nursing note reviewed.  Constitutional:      Appearance: Normal appearance.  HENT:     Head: Normocephalic.     Right Ear: Ear canal and external ear normal.     Left Ear: Ear canal and external ear normal.     Nose: Nose normal.     Mouth/Throat:     Mouth: Mucous membranes are moist.     Pharynx: Oropharynx is clear.  Eyes:     Conjunctiva/sclera: Conjunctivae normal.  Cardiovascular:     Rate and Rhythm: Normal rate and regular rhythm.     Pulses: Normal pulses.     Heart sounds: Normal heart sounds.  Pulmonary:     Effort: Pulmonary effort is normal.  Breath sounds: Normal breath sounds.  Abdominal:     General: Bowel sounds are normal.  Skin:    General: Skin is warm.     Findings: No rash.  Neurological:     General: No focal deficit present.     Mental Status: She is alert and oriented to person, place, and time.  Psychiatric:        Mood and Affect: Mood normal.         Behavior: Behavior normal.    BP (!) 108/57   Pulse 65   Temp 97.7 F (36.5 C)   Resp 20   Ht _0  (1.626 m)   Wt 162 lb (73.5 kg)   LMP  (LMP Unknown)   SpO2 99%   BMI 27.81 kg/m  Wt Readings from Last 3 Encounters:  01/18/21 162 lb (73.5 kg)  01/12/21 167 lb 1.7 oz (75.8 kg)  12/31/20 164 lb 12.8 oz (74.8 kg)     Health Maintenance Due  Topic Date Due   OPHTHALMOLOGY EXAM  Never done   COVID-19 Vaccine (3 - Moderna risk series) 01/28/2020   Pneumonia Vaccine 81+ Years old (3 - PCV) 03/11/2021    There are no preventive care reminders to display for this patient.  Lab Results  Component Value Date   TSH 1.995 01/10/2021   Lab Results  Component Value Date   WBC 6.6 01/12/2021   HGB 11.2 (L) 01/12/2021   HCT 34.5 (L) 01/12/2021   MCV 90.3 01/12/2021   PLT 175 01/12/2021   Lab Results  Component Value Date   NA 139 01/12/2021   K 4.4 01/12/2021   CO2 28 01/12/2021   GLUCOSE 120 (H) 01/12/2021   BUN 12 01/12/2021   CREATININE 0.86 01/12/2021   BILITOT 0.3 01/09/2021   ALKPHOS 90 01/09/2021   AST 29 01/09/2021   ALT 74 (H) 01/09/2021   PROT 6.6 01/09/2021   ALBUMIN 4.0 01/09/2021   CALCIUM 9.3 01/12/2021   ANIONGAP 6 01/12/2021   EGFR 80 12/03/2020   Lab Results  Component Value Date   CHOL 109 08/27/2020   Lab Results  Component Value Date   HDL 46 08/27/2020   Lab Results  Component Value Date   LDLCALC 37 08/27/2020   Lab Results  Component Value Date   TRIG 155 (H) 08/27/2020   Lab Results  Component Value Date   CHOLHDL 2.4 08/27/2020   Lab Results  Component Value Date   HGBA1C 6.6 (H) 01/10/2021      Assessment & Plan:   Problem List Items Addressed This Visit       Cardiovascular and Mediastinum   Acute CHF (congestive heart failure) ([place]) - Primary    Symptoms are resolving, patient is stable with no new symptoms of shortness of breath, palpitations,.  Patient was started on Lasix in the hospital 10 days and  potassium for 10 days. Education provided to patient with printed handouts given patient is to follow-up with worsening unresolved symptoms.        Other   Hospital discharge follow-up    Patient was admitted to [facility] with symptoms of shortness of breath, and A. Fib. Patient is reporting that she feels a lot better today no new or worsening signs and symptoms patient is taking medication as prescribed.  I completed hospital discharge instructions with medication reconciliation. Printed handouts given. Patient is to follow-up with worsening or unresolved symptoms.          Follow-up: Return  for  next appointment as scheduled.    Ivy Lynn, NP

## 2021-01-18 NOTE — Patient Instructions (Signed)
Atrial Fibrillation Atrial fibrillation is a type of heartbeat that is irregular or fast. If you have this condition, your heart beats without any order. This makes it hard for your heart to pump blood in a normal way. Atrial fibrillation may come and go, or it may become a long-lasting problem. If this condition is not treated, it can put you at higher risk for stroke, heart failure, and other heart problems. What are the causes? This condition may be caused by diseases that damage the heart. They include: High blood pressure. Heart failure. Heart valve disease. Heart surgery. Other causes include: Diabetes. Thyroid disease. Being overweight. Kidney disease. Sometimes the cause is not known. What increases the risk? You are more likely to develop this condition if: You are older. You smoke. You exercise often and very hard. You have a family history of this condition. You are a man. You use drugs. You drink a lot of alcohol. You have lung conditions, such as emphysema, pneumonia, or COPD. You have sleep apnea. What are the signs or symptoms? Common symptoms of this condition include: A feeling that your heart is beating very fast. Chest pain or discomfort. Feeling short of breath. Suddenly feeling light-headed or weak. Getting tired easily during activity. Fainting. Sweating. In some cases, there are no symptoms. How is this treated? Treatment for this condition depends on underlying conditions and how you feel when you have atrial fibrillation. They include: Medicines to: Prevent blood clots. Treat heart rate or heart rhythm problems. Using devices, such as a pacemaker, to correct heart rhythm problems. Doing surgery to remove the part of the heart that sends bad signals. Closing an area where clots can form in the heart (left atrial appendage). In some cases, your doctor will treat other underlying conditions. Follow these instructions at home: Medicines Take  over-the-counter and prescription medicines only as told by your doctor. Do not take any new medicines without first talking to your doctor. If you are taking blood thinners: Talk with your doctor before you take any medicines that have aspirin or NSAIDs, such as ibuprofen, in them. Take your medicine exactly as told by your doctor. Take it at the same time each day. Avoid activities that could hurt or bruise you. Follow instructions about how to prevent falls. Wear a bracelet that says you are taking blood thinners. Or, carry a card that lists what medicines you take. Lifestyle   Do not use any products that have nicotine or tobacco in them. These include cigarettes, e-cigarettes, and chewing tobacco. If you need help quitting, ask your doctor. Eat heart-healthy foods. Talk with your doctor about the right eating plan for you. Exercise regularly as told by your doctor. Do not drink alcohol. Lose weight if you are overweight. Do not use drugs, including cannabis. General instructions If you have a condition that causes breathing to stop for a short period of time (apnea), treat it as told by your doctor. Keep a healthy weight. Do not use diet pills unless your doctor says they are safe for you. Diet pills may make heart problems worse. Keep all follow-up visits as told by your doctor. This is important. Contact a doctor if: You notice a change in the speed, rhythm, or strength of your heartbeat. You are taking a blood-thinning medicine and you get more bruising. You get tired more easily when you move or exercise. You have a sudden change in weight. Get help right away if:  You have pain in your chest or  your belly (abdomen). You have trouble breathing. You have side effects of blood thinners, such as blood in your vomit, poop (stool), or pee (urine), or bleeding that cannot stop. You have any signs of a stroke. "BE FAST" is an easy way to remember the main warning signs: B - Balance.  Signs are dizziness, sudden trouble walking, or loss of balance. E - Eyes. Signs are trouble seeing or a change in how you see. F - Face. Signs are sudden weakness or loss of feeling in the face, or the face or eyelid drooping on one side. A - Arms. Signs are weakness or loss of feeling in an arm. This happens suddenly and usually on one side of the body. S - Speech. Signs are sudden trouble speaking, slurred speech, or trouble understanding what people say. T - Time. Time to call emergency services. Write down what time symptoms started. You have other signs of a stroke, such as: A sudden, very bad headache with no known cause. Feeling like you may vomit (nausea). Vomiting. A seizure. These symptoms may be an emergency. Do not wait to see if the symptoms will go away. Get medical help right away. Call your local emergency services (911 in the [place] Do not drive yourself to the hospital. Summary Atrial fibrillation is a type of heartbeat that is irregular or fast. You are at higher risk of this condition if you smoke, are older, have diabetes, or are overweight. Follow your doctor's instructions about medicines, diet, exercise, and follow-up visits. Get help right away if you have signs or symptoms of a stroke. Get help right away if you cannot catch your breath, or you have chest pain or discomfort. This information is not intended to replace advice given to you by your health care provider. Make sure you discuss any questions you have with your health care provider. Document Revised: 07/31/2018 Document Reviewed: 07/31/2018 Elsevier Patient Education  [place].

## 2021-01-18 NOTE — Assessment & Plan Note (Signed)
Patient was admitted to [facility] with symptoms of shortness of breath, and A. Fib. Patient is reporting that she feels a lot better today no new or worsening signs and symptoms patient is taking medication as prescribed.  I completed hospital discharge instructions with medication reconciliation. Printed handouts given. Patient is to follow-up with worsening or unresolved symptoms.

## 2021-01-18 NOTE — Assessment & Plan Note (Signed)
Symptoms are resolving, patient is stable with no new symptoms of shortness of breath, palpitations,.  Patient was started on Lasix in the hospital 10 days and potassium for 10 days. Education provided to patient with printed handouts given patient is to follow-up with worsening unresolved symptoms.

## 2021-01-28 ENCOUNTER — Encounter: Payer: Self-pay | Admitting: Internal Medicine

## 2021-01-28 ENCOUNTER — Ambulatory Visit (INDEPENDENT_AMBULATORY_CARE_PROVIDER_SITE_OTHER): Payer: Medicare Other | Admitting: Internal Medicine

## 2021-01-28 VITALS — BP 160/70 | HR 64 | Ht 65.0 in | Wt 165.6 lb

## 2021-01-28 DIAGNOSIS — I25119 Atherosclerotic heart disease of native coronary artery with unspecified angina pectoris: Secondary | ICD-10-CM | POA: Diagnosis not present

## 2021-01-28 DIAGNOSIS — I480 Paroxysmal atrial fibrillation: Secondary | ICD-10-CM | POA: Diagnosis not present

## 2021-01-28 DIAGNOSIS — I10 Essential (primary) hypertension: Secondary | ICD-10-CM | POA: Diagnosis not present

## 2021-01-28 NOTE — Patient Instructions (Addendum)
Medication Instructions:  Continue all current medications.  Labwork: none  Testing/Procedures: none  Follow-Up: Pending   Any Other Special Instructions Will Be Listed Below (If Applicable).   If you need a refill on your cardiac medications before your next appointment, please call your pharmacy.

## 2021-01-28 NOTE — Progress Notes (Signed)
PCP: Ivy Lynn, NP Primary Cardiologist: Dr Domenic Polite Primary EP: Dr Melina Schools [name] is a 80 y.o. female who presents today for routine electrophysiology followup.  Since last being seen in our clinic, the patient reports doing reasonably well.  She developed afib again and was hospitalized in November.  She converted to sinus.  She is doing better now.  Today, she denies symptoms of palpitations, chest pain, shortness of breath,  lower extremity edema, dizziness, presyncope, or syncope.  The patient is otherwise without complaint today.   Past Medical History:  Diagnosis Date   Anxiety    Arthritis    Bulging lumbar disc    Chronic lower back pain    Coronary atherosclerosis of native coronary artery    DES LAD 02/2008, DES LAD 02/2015, DES ramus and DES x2 mid RCA 02/2020   Depression    Dyslipidemia    Essential hypertension    Facial numbness    Frequent headaches    GERD (gastroesophageal reflux disease)    Glaucoma    Heart attack ([place])    Insomnia    Paroxysmal atrial fibrillation [facility])    Diagnosed October 2019   PSVT (paroxysmal supraventricular tachycardia) ([place])    Refusal of blood transfusions as patient is Jehovah's Witness    Thyroid disease    Type 2 diabetes mellitus ([place])    Borderline   Vitamin D deficiency    Past Surgical History:  Procedure Laterality Date   CARDIAC CATHETERIZATION N/A 02/23/2015   Procedure: Left Heart Cath and Coronary Angiography;  Surgeon: Sherren Mocha, MD;  Location: [place] [facility];  Service: Cardiovascular;  Laterality: N/A;   CARDIAC CATHETERIZATION N/A 02/23/2015   Procedure: Coronary Stent Intervention;  Surgeon: Sherren Mocha, MD;  Location: [place] [facility];  Service: Cardiovascular;  Laterality: N/A;   CARDIAC CATHETERIZATION N/A 01/04/2016   Procedure: Left Heart Cath and Coronary Angiography;  Surgeon: Peter M Martinique, MD;  Location: [place] [facility];  Service: Cardiovascular;  Laterality: N/A;   CATARACT  EXTRACTION     CORONARY ANGIOPLASTY  02/23/2015   CORONARY ANGIOPLASTY WITH STENT PLACEMENT  2009   CORONARY STENT INTERVENTION N/A 03/09/2020   Procedure: CORONARY STENT INTERVENTION;  Surgeon: Jettie Booze, MD;  Location: [place] [facility];  Service: Cardiovascular;  Laterality: N/A;   DILATION AND CURETTAGE OF UTERUS     implantable loop recorder placement  04/01/2020   Medtronic Reveal Linq model LNQ [id number]) implantable loop recorder    LEFT HEART CATH AND CORONARY ANGIOGRAPHY N/A 03/09/2020   Procedure: LEFT HEART CATH AND CORONARY ANGIOGRAPHY;  Surgeon: Jettie Booze, MD;  Location: [place] [facility];  Service: Cardiovascular;  Laterality: N/A;   TUBAL LIGATION      ROS- all systems are reviewed and negatives except as per HPI above  Current Outpatient Medications  Medication Sig Dispense Refill   apixaban (ELIQUIS) 5 MG TABS tablet Take 1 tablet (5 mg total) by mouth 2 (two) times daily. 180 tablet 3   Ascorbic Acid (VITAMIN C) 500 MG tablet Take 500 mg by mouth daily.       bimatoprost (LUMIGAN) 0.01 % SOLN Place 1 drop into both eyes at bedtime.     Calcium Carbonate Antacid (TUMS PO) Take 2 tablets by mouth 4 (four) times daily as needed (for acid reflux/indigestion).      Cholecalciferol (VITAMIN D) 400 UNITS capsule Take 400 Units by mouth daily.  ciclopirox (PENLAC) 8 % solution Apply 1 application topically at bedtime.     clopidogrel (PLAVIX) 75 MG tablet Take 1 tablet (75 mg total) by mouth daily with breakfast. 90 tablet 3   Cyanocobalamin (VITAMIN B 12 PO) Take 1 tablet by mouth daily.     diclofenac Sodium (VOLTAREN) 1 % GEL Apply 2 g topically 4 (four) times daily. 50 g 2   diltiazem (CARDIZEM CD) 300 MG 24 hr capsule Take 1 capsule (300 mg total) by mouth daily. 90 capsule 3   Garlic 333 MG TABS Take 1 tablet by mouth daily.     HYDROcodone-acetaminophen (NORCO) 5-325 MG tablet Take 1 tablet by mouth every 6 (six) hours as needed for moderate  pain. 120 tablet 0   levothyroxine (SYNTHROID) 75 MCG tablet Take 1 tablet (75 mcg total) by mouth daily before breakfast. 30 tablet 3   losartan (COZAAR) 50 MG tablet Take 1 tablet (50 mg total) by mouth daily. 90 tablet 3   MAGNESIUM CARBONATE PO Take 1 tablet by mouth daily. 400 mg     metFORMIN (GLUCOPHAGE-XR) 500 MG 24 hr tablet Take 1 tablet (500 mg total) by mouth daily with breakfast. 30 tablet 4   metoprolol succinate (TOPROL XL) 100 MG 24 hr tablet Take 1 tablet (100 mg total) by mouth daily. Take with or immediately following a meal. 30 tablet 11   nitroGLYCERIN (NITROSTAT) 0.4 MG SL tablet Place 0.4 mg under the tongue every 5 (five) minutes as needed for chest pain.     promethazine (PHENERGAN) 25 MG tablet 1 tablet as needed.     rosuvastatin (CRESTOR) 20 MG tablet TAKE ONE (1) TABLET BY MOUTH EVERY DAY 90 tablet 3   timolol (TIMOPTIC) 0.5 % ophthalmic solution Place 1 drop into both eyes daily.     traZODone (DESYREL) 100 MG tablet TAKE ONE TABLET BY MOUTH AT BEDTIME 90 tablet 0   TURMERIC PO Take 1 tablet by mouth daily.     VENTOLIN HFA 108 (90 Base) MCG/ACT inhaler Inhale 1-2 puffs into the lungs every 4 (four) hours as needed for shortness of breath. 18 g 1   furosemide (LASIX) 20 MG tablet Take 1 tablet (20 mg total) by mouth daily for 10 days. 10 tablet 0   potassium chloride (KLOR-CON) 10 MEQ tablet Take 1 tablet (10 mEq total) by mouth daily for 10 days. Take While taking Lasix/furosemide 10 tablet 0   No current facility-administered medications for this visit.    Physical Exam: Vitals:   01/28/21 1153  BP: (!) 160/70  Pulse: 64  SpO2: 98%  Weight: 165 lb 9.6 oz (75.1 kg)  Height: 5\' 5"  (1.651 m)    GEN- The patient is well appearing, alert and oriented x 3 today.   Head- normocephalic, atraumatic Eyes-  Sclera clear, conjunctiva pink Ears- hearing intact Oropharynx- clear Lungs- Clear to ausculation bilaterally, normal work of breathing Heart- Regular rate  and rhythm, no murmurs, rubs or gallops, PMI not laterally displaced GI- soft, NT, ND, + BS Extremities- no clubbing, cyanosis, or edema  Wt Readings from Last 3 Encounters:  01/28/21 165 lb 9.6 oz (75.1 kg)  01/18/21 162 lb (73.5 kg)  01/12/21 167 lb 1.7 oz (75.8 kg)     Assessment and Plan:  Persistent atrial fibrillation Doing reasonably well.  AF burden is 15.6%.  she was hospitalized for AF in November. Chads2vasc score is 6.  She is on eliquis  Therapeutic strategies for afib including medicine (tikosyn, amiodarone)  and ablation were discussed in detail with the patient today. Risk, benefits, and alternatives to EP study and radiofrequency ablation for afib were also discussed in detail today. These risks include but are not limited to stroke, bleeding, vascular damage, tamponade, perforation, damage to the esophagus, lungs, and other structures, pulmonary vein stenosis, worsening renal function, and death. The patient understands these risk and wishes to think about this further with her daughter.  We will follow up with her next week for next steps.  2. HTN Stable No change required today  3. CAD No ischemic symptoms No changes  4. Chronic diastolic dysfunction NYHA Class II Stable No change required today    Thompson Grayer MD, [facility] 01/28/2021 12:01 PM

## 2021-01-31 ENCOUNTER — Telehealth: Payer: Self-pay | Admitting: *Deleted

## 2021-01-31 ENCOUNTER — Ambulatory Visit (INDEPENDENT_AMBULATORY_CARE_PROVIDER_SITE_OTHER): Payer: Medicare Other

## 2021-01-31 ENCOUNTER — Encounter: Payer: Self-pay | Admitting: *Deleted

## 2021-01-31 DIAGNOSIS — Z01818 Encounter for other preprocedural examination: Secondary | ICD-10-CM

## 2021-01-31 DIAGNOSIS — I480 Paroxysmal atrial fibrillation: Secondary | ICD-10-CM

## 2021-01-31 NOTE — Telephone Encounter (Signed)
-----   Message from Thompson Grayer, MD sent at 01/28/2021 12:14 PM EST ----- She isnt sure if she wants to do tikosyn or ablation. Can you follow-up with her on Monday?  If she decides to do ablation this I will do it when I am in the hospital December 28.

## 2021-01-31 NOTE — Telephone Encounter (Signed)
Spoke with the patient and her daughter Corporate investment banker) together. She has decided to move forward with Ablation. Set up ablation and lab appointment at the [address] office. Will meet with RN on lab day for instructions.   Verbalized understanding with read back for lab appt.

## 2021-02-01 LAB — CUP PACEART REMOTE DEVICE CHECK
Date Time Interrogation Session: 20221205204518
Implantable Pulse Generator Implant Date: 20220210

## 2021-02-03 ENCOUNTER — Other Ambulatory Visit: Payer: Self-pay

## 2021-02-03 ENCOUNTER — Other Ambulatory Visit: Payer: Medicare Other

## 2021-02-03 DIAGNOSIS — I480 Paroxysmal atrial fibrillation: Secondary | ICD-10-CM

## 2021-02-03 DIAGNOSIS — Z01818 Encounter for other preprocedural examination: Secondary | ICD-10-CM

## 2021-02-03 LAB — CBC WITH DIFFERENTIAL/PLATELET
Basophils Absolute: 0 10*3/uL (ref 0.0–0.2)
Basos: 0 %
EOS (ABSOLUTE): 0.3 10*3/uL (ref 0.0–0.4)
Eos: 5 %
Hematocrit: 37.2 % (ref 34.0–46.6)
Hemoglobin: 12.5 g/dL (ref 11.1–15.9)
Lymphocytes Absolute: 1.6 10*3/uL (ref 0.7–3.1)
Lymphs: 25 %
MCH: 29.5 pg (ref 26.6–33.0)
MCHC: 33.6 g/dL (ref 31.5–35.7)
MCV: 88 fL (ref 79–97)
Monocytes Absolute: 0.5 10*3/uL (ref 0.1–0.9)
Monocytes: 8 %
Neutrophils Absolute: 3.9 10*3/uL (ref 1.4–7.0)
Neutrophils: 62 %
Platelets: 213 10*3/uL (ref 150–450)
RBC: 4.24 x10E6/uL (ref 3.77–5.28)
RDW: 13.2 % (ref 11.7–15.4)
WBC: 6.3 10*3/uL (ref 3.4–10.8)

## 2021-02-03 LAB — BASIC METABOLIC PANEL
BUN/Creatinine Ratio: 18 (ref 12–28)
BUN: 13 mg/dL (ref 8–27)
CO2: 31 mmol/L — ABNORMAL HIGH (ref 20–29)
Calcium: 9.9 mg/dL (ref 8.7–10.3)
Chloride: 102 mmol/L (ref 96–106)
Creatinine, Ser: 0.71 mg/dL (ref 0.57–1.00)
Glucose: 125 mg/dL — ABNORMAL HIGH (ref 70–99)
Potassium: 4 mmol/L (ref 3.5–5.2)
Sodium: 139 mmol/L (ref 134–144)
eGFR: 86 mL/min/{1.73_m2} (ref >59)

## 2021-02-07 ENCOUNTER — Other Ambulatory Visit (HOSPITAL_COMMUNITY)
Admission: RE | Admit: 2021-02-07 | Discharge: 2021-02-07 | Disposition: A | Discharge status: Home / Self Care | Payer: Medicare Other | Source: Ambulatory Visit | Attending: Nurse Practitioner | Admitting: Nurse Practitioner

## 2021-02-07 DIAGNOSIS — E032 Hypothyroidism due to medicaments and other exogenous substances: Secondary | ICD-10-CM | POA: Insufficient documentation

## 2021-02-07 LAB — T4, FREE: Free T4: 1.37 ng/dL — ABNORMAL HIGH (ref 0.61–1.12)

## 2021-02-07 LAB — TSH: TSH: 0.232 u[IU]/mL — ABNORMAL LOW (ref 0.350–4.500)

## 2021-02-09 ENCOUNTER — Telehealth (HOSPITAL_COMMUNITY): Payer: Self-pay | Admitting: Emergency Medicine

## 2021-02-09 NOTE — Telephone Encounter (Signed)
Attempted to call patient regarding upcoming cardiac CT appointment. °Left message on voicemail with name and callback number °Khayree Delellis RN Navigator Cardiac Imaging °[facility] °[phone] Office °[phone] Cell ° °

## 2021-02-10 ENCOUNTER — Telehealth (HOSPITAL_COMMUNITY): Payer: Self-pay | Admitting: Emergency Medicine

## 2021-02-10 ENCOUNTER — Other Ambulatory Visit: Payer: Self-pay

## 2021-02-10 ENCOUNTER — Ambulatory Visit (HOSPITAL_COMMUNITY)
Admission: RE | Admit: 2021-02-10 | Discharge: 2021-02-10 | Disposition: A | Discharge status: Home / Self Care | Payer: Medicare Other | Source: Ambulatory Visit | Attending: Internal Medicine | Admitting: Internal Medicine

## 2021-02-10 ENCOUNTER — Ambulatory Visit: Payer: Medicare Other | Admitting: Nurse Practitioner

## 2021-02-10 ENCOUNTER — Encounter: Payer: Self-pay | Admitting: Nurse Practitioner

## 2021-02-10 VITALS — BP 145/75 | HR 68 | Ht 64.0 in | Wt 165.2 lb

## 2021-02-10 DIAGNOSIS — I480 Paroxysmal atrial fibrillation: Secondary | ICD-10-CM | POA: Insufficient documentation

## 2021-02-10 DIAGNOSIS — E032 Hypothyroidism due to medicaments and other exogenous substances: Secondary | ICD-10-CM | POA: Diagnosis not present

## 2021-02-10 DIAGNOSIS — E042 Nontoxic multinodular goiter: Secondary | ICD-10-CM

## 2021-02-10 MED ORDER — IOHEXOL 350 MG/ML SOLN
100.0000 mL | Freq: Once | INTRAVENOUS | Status: AC | PRN
  Administered 2021-02-10: 17:00:00 100 mL via INTRAVENOUS

## 2021-02-10 MED ORDER — LEVOTHYROXINE SODIUM 50 MCG PO TABS
50.0000 ug | ORAL_TABLET | Freq: Every day | ORAL | 3 refills | Status: DC
Start: 2021-02-10 — End: 2021-08-09

## 2021-02-10 MED ORDER — NITROGLYCERIN 0.4 MG SL SUBL: 0.8000 mg | SUBLINGUAL_TABLET | Freq: Once | SUBLINGUAL | Status: DC

## 2021-02-10 NOTE — Progress Notes (Signed)
02/10/2021, 10:46 AM                          Endocrinology Follow Up Visit   Subjective:    Patient ID: [name], female    DOB: 03-30-1940, PCP Ivy Lynn, NP   Past Medical History:  Diagnosis Date   Anxiety    Arthritis    Bulging lumbar disc    Chronic lower back pain    Coronary atherosclerosis of native coronary artery    DES LAD 02/2008, DES LAD 02/2015, DES ramus and DES x2 mid RCA 02/2020   Depression    Dyslipidemia    Essential hypertension    Facial numbness    Frequent headaches    GERD (gastroesophageal reflux disease)    Glaucoma    Heart attack ([place])    Insomnia    Paroxysmal atrial fibrillation [facility])    Diagnosed October 2019   PSVT (paroxysmal supraventricular tachycardia) ([place])    Refusal of blood transfusions as patient is Jehovah's Witness    Thyroid disease    Type 2 diabetes mellitus ([place])    Borderline   Vitamin D deficiency    Past Surgical History:  Procedure Laterality Date   CARDIAC CATHETERIZATION N/A 02/23/2015   Procedure: Left Heart Cath and Coronary Angiography;  Surgeon: Sherren Mocha, MD;  Location: [place] [facility];  Service: Cardiovascular;  Laterality: N/A;   CARDIAC CATHETERIZATION N/A 02/23/2015   Procedure: Coronary Stent Intervention;  Surgeon: Sherren Mocha, MD;  Location: [place] [facility];  Service: Cardiovascular;  Laterality: N/A;   CARDIAC CATHETERIZATION N/A 01/04/2016   Procedure: Left Heart Cath and Coronary Angiography;  Surgeon: Peter M Martinique, MD;  Location: [place] [facility];  Service: Cardiovascular;  Laterality: N/A;   CATARACT EXTRACTION     CORONARY ANGIOPLASTY  02/23/2015   CORONARY ANGIOPLASTY WITH STENT PLACEMENT  2009   CORONARY STENT INTERVENTION N/A 03/09/2020   Procedure: CORONARY STENT INTERVENTION;  Surgeon: Jettie Booze, MD;  Location: [place] [facility];  Service: Cardiovascular;  Laterality: N/A;   DILATION AND CURETTAGE OF  UTERUS     implantable loop recorder placement  04/01/2020   Medtronic Reveal Linq model LNQ 22 [phone]) implantable loop recorder    LEFT HEART CATH AND CORONARY ANGIOGRAPHY N/A 03/09/2020   Procedure: LEFT HEART CATH AND CORONARY ANGIOGRAPHY;  Surgeon: Jettie Booze, MD;  Location: [place] [facility];  Service: Cardiovascular;  Laterality: N/A;   TUBAL LIGATION     Social History   Socioeconomic History   Marital status: Widowed    Spouse name: Not on file   Number of children: 8   Years of education: 10th grade   Highest education level: 10th grade  Occupational History   Occupation: RETIRED  Tobacco Use   Smoking status: Never   Smokeless tobacco: Never  Vaping Use   Vaping Use: Never used  Substance and Sexual Activity   Alcohol use: Yes    Alcohol/week: 1.0 standard drink    Types: 1 Glasses of wine per week    Comment: RARE OCCASION   Drug use: No   Sexual activity: Not Currently  Other Topics Concern  Not on file  Social History Narrative   Lives at home alone.   Right-handed.   No caffeine use.   Social Determinants of Health   Financial Resource Strain: Low Risk    Difficulty of Paying Living Expenses: Not hard at all  Food Insecurity: No Food Insecurity   Worried About Charity fundraiser in the Last Year: Never true   [place] in the Last Year: Never true  Transportation Needs: No Transportation Needs   Lack of Transportation (Medical): No   Lack of Transportation (Non-Medical): No  Physical Activity: Inactive   Days of Exercise per Week: 0 days   Minutes of Exercise per Session: 0 min  Stress: No Stress Concern Present   Feeling of Stress : Not at all  Social Connections: Moderately Integrated   Frequency of Communication with Friends and Family: More than three times a week   Frequency of Social Gatherings with Friends and Family: More than three times a week   Attends Religious Services: More than 4 times per year   Active Member  of [employer] or Organizations: Yes   Attends Archivist Meetings: More than 4 times per year   Marital Status: Widowed   Family History  Problem Relation Age of Onset   Other Mother        "natural causes"   Heart disease Mother    Cancer Father        unsure of type   Outpatient Encounter Medications as of 02/10/2021  Medication Sig   apixaban (ELIQUIS) 5 MG TABS tablet Take 1 tablet (5 mg total) by mouth 2 (two) times daily.   Ascorbic Acid (VITAMIN C) 500 MG tablet Take 500 mg by mouth daily.     bimatoprost (LUMIGAN) 0.01 % SOLN Place 1 drop into both eyes at bedtime.   Calcium Carbonate Antacid (TUMS PO) Take 2 tablets by mouth 4 (four) times daily as needed (for acid reflux/indigestion).    Camphor-Menthol-Methyl Sal (SALONPAS) 3.02-26-08 % PTCH Apply 1 patch topically daily as needed (pain).   Cholecalciferol (VITAMIN D) 400 UNITS capsule Take 400 Units by mouth daily.     ciclopirox (PENLAC) 8 % solution Apply 1 application topically at bedtime.   clopidogrel (PLAVIX) 75 MG tablet Take 1 tablet (75 mg total) by mouth daily with breakfast.   Cyanocobalamin (VITAMIN B 12 PO) Take 1 tablet by mouth daily.   diclofenac Sodium (VOLTAREN) 1 % GEL Apply 2 g topically 4 (four) times daily. (Patient taking differently: Apply 2 g topically 4 (four) times daily as needed (pain).)   diltiazem (CARDIZEM CD) 300 MG 24 hr capsule Take 1 capsule (300 mg total) by mouth daily.   Garlic 333 MG TABS Take 1 tablet by mouth at bedtime.   HYDROcodone-acetaminophen (NORCO) 5-325 MG tablet Take 1 tablet by mouth every 6 (six) hours as needed for moderate pain.   losartan (COZAAR) 50 MG tablet Take 1 tablet (50 mg total) by mouth daily.   MAGNESIUM CARBONATE PO Take 1 tablet by mouth daily. 400 mg   metFORMIN (GLUCOPHAGE-XR) 500 MG 24 hr tablet Take 1 tablet (500 mg total) by mouth daily with breakfast.   metoprolol succinate (TOPROL XL) 100 MG 24 hr tablet Take 1 tablet (100 mg total) by mouth  daily. Take with or immediately following a meal.   nitroGLYCERIN (NITROSTAT) 0.4 MG SL tablet Place 0.4 mg under the tongue every 5 (five) minutes as needed for chest pain.   promethazine (  PHENERGAN) 25 MG tablet Take 1 tablet by mouth every 8 (eight) hours as needed for nausea or vomiting.   rosuvastatin (CRESTOR) 20 MG tablet TAKE ONE (1) TABLET BY MOUTH EVERY DAY   timolol (TIMOPTIC) 0.5 % ophthalmic solution Place 1 drop into both eyes daily.   traZODone (DESYREL) 100 MG tablet TAKE ONE TABLET BY MOUTH AT BEDTIME   TURMERIC PO Take 1 tablet by mouth daily.   VENTOLIN HFA 108 (90 Base) MCG/ACT inhaler Inhale 1-2 puffs into the lungs every 4 (four) hours as needed for shortness of breath.   [DISCONTINUED] levothyroxine (SYNTHROID) 75 MCG tablet Take 1 tablet (75 mcg total) by mouth daily before breakfast.   levothyroxine (SYNTHROID) 50 MCG tablet Take 1 tablet (50 mcg total) by mouth daily before breakfast.   No facility-administered encounter medications on file as of 02/10/2021.   ALLERGIES: No Known Allergies  VACCINATION STATUS: Immunization History  Administered Date(s) Administered   Fluad Quad(high Dose 65+) 11/20/2019, 12/03/2020   Influenza Split 12/05/2005, 12/03/2006   Influenza, High Dose Seasonal PF 12/25/2016, 01/07/2018   Influenza,inj,quad, With Preservative 12/03/2015   Influenza-Unspecified 01/30/1995, 12/13/2000, 03/16/2003, 01/21/2015, 12/25/2016, 01/07/2018, 11/21/2018   Moderna SARS-COV2 Booster Vaccination 12/31/2019   Moderna Sars-Covid-2 Vaccination 04/01/2019, 04/29/2019   Pneumococcal Polysaccharide-23 01/30/1995, 12/05/2005, 03/11/2020   Pneumococcal-Unspecified 12/24/2018   Tdap 12/24/2018    Thyroid Problem Presents for follow-up visit. Symptoms include fatigue and palpitations (having cardiac ablation soon). Patient reports no anxiety, cold intolerance, constipation, depressed mood, heat intolerance, tremors, weight gain or weight loss. The symptoms  have been stable.   [name] is 80 y.o. female who is being engaged in follow-up after she was seen in consultation for hypothyroidism.    PCP:  Ivy Lynn, NP.   Patient is assisted by her daughter during visit.   Her labs confirm primary hypothyroidism. Of note, she was treated with amiodarone for almost a year due to cardiac dysrhythmia .  Patient denies any family history of thyroid dysfunction, except for her sister who had nodules on her thyroid requiring biopsy which were benign.  She also had suspicious nodules on her thyroid requiring biopsy.  All came back benign, even the one that needed to be sent to [facility] for confirmation.  She reports weight gain, fatigue, cold intolerance, constipation.   Review of systems  Constitutional: + stable body weight,  current Body mass index is 28.36 kg/m. , + fatigue, no subjective hyperthermia, no subjective hypothermia Eyes: no blurry vision, no xerophthalmia ENT: no sore throat, no nodules palpated in throat, no dysphagia/odynophagia, no hoarseness Cardiovascular: no chest pain, no shortness of breath, + intermittent palpitations- hx afib (having cardiac ablation soon), no leg swelling Respiratory: no cough, no shortness of breath Gastrointestinal: no nausea/vomiting/diarrhea, Musculoskeletal: no muscle/joint aches Skin: no rashes, no hyperemia Neurological: no tremors, no numbness, no tingling, no dizziness Psychiatric: no depression, no anxiety  Objective:    BP (!) 145/75    Pulse 68    Ht 5\' 4"  (1.626 m)    Wt 165 lb 3.2 oz (74.9 kg)    LMP  (LMP Unknown)    SpO2 98%    BMI 28.36 kg/m   Wt Readings from Last 3 Encounters:  02/10/21 165 lb 3.2 oz (74.9 kg)  01/28/21 165 lb 9.6 oz (75.1 kg)  01/18/21 162 lb (73.5 kg)    BP Readings from Last 3 Encounters:  02/10/21 (!) 145/75  01/28/21 (!) 160/70  01/18/21 (!) 108/57  Physical Exam- Limited  Constitutional:  Body mass index is 28.36 kg/m. , not in acute  distress, normal state of mind Eyes:  EOMI, no exophthalmos Neck: Supple Musculoskeletal: no gross deformities, strength intact in all four extremities, no gross restriction of joint movements Skin:  no rashes, no hyperemia Neurological: no tremor with outstretched hands    CMP ( most recent) CMP     Component Value Date/Time   NA 139 02/03/2021 1501   K 4.0 02/03/2021 1501   CL 102 02/03/2021 1501   CO2 31 (H) 02/03/2021 1501   GLUCOSE 125 (H) 02/03/2021 1501   GLUCOSE 120 (H) 01/12/2021 0424   BUN 13 02/03/2021 1501   CREATININE 0.71 02/03/2021 1501   CALCIUM 9.9 02/03/2021 1501   PROT 6.6 01/09/2021 1357   PROT 6.5 12/03/2020 1502   ALBUMIN 4.0 01/09/2021 1357   ALBUMIN 4.6 12/03/2020 1502   AST 29 01/09/2021 1357   ALT 74 (H) 01/09/2021 1357   ALKPHOS 90 01/09/2021 1357   BILITOT 0.3 01/09/2021 1357   BILITOT 0.2 12/03/2020 1502   GFRNONAA >60 01/12/2021 0424   GFRAA >60 10/21/2019 1347     Diabetic Labs (most recent): Lab Results  Component Value Date   HGBA1C 6.6 (H) 01/10/2021   HGBA1C 6.5 (H) 12/03/2020   HGBA1C 6.7 08/27/2020     Lipid Panel ( most recent) Lipid Panel     Component Value Date/Time   CHOL 109 08/27/2020 1518   TRIG 155 (H) 08/27/2020 1518   HDL 46 08/27/2020 1518   CHOLHDL 2.4 08/27/2020 1518   CHOLHDL 2.4 03/09/2020 0209   VLDL 12 03/09/2020 0209   LDLCALC 37 08/27/2020 1518   LABVLDL 26 08/27/2020 1518      Lab Results  Component Value Date   TSH 0.232 (L) 02/07/2021   TSH 1.995 01/10/2021   TSH 0.552 08/27/2020   TSH 0.745 08/06/2020   TSH 6.335 (H) 05/05/2020   TSH 10.867 (H) 03/03/2020   TSH 20.523 (H) 02/03/2020   TSH 16.400 (H) 01/30/2020   TSH 11.769 (H) 11/11/2019   TSH 5.290 (H) 08/12/2019   FREET4 1.37 (H) 02/07/2021   FREET4 1.26 (H) 08/06/2020   FREET4 0.95 05/05/2020   FREET4 0.65 03/03/2020   FREET4 0.44 (L) 02/03/2020     Thyroid US on 04/29/20 CLINICAL DATA:  Hyperthyroidism   EXAM: THYROID  ULTRASOUND   TECHNIQUE: Ultrasound examination of the thyroid gland and adjacent soft tissues was performed.   COMPARISON:  None.   FINDINGS: Parenchymal Echotexture: Mildly heterogeneous   Isthmus: 0.7 cm   Right lobe: 6.2 x 2.6 x 2.2 cm   Left lobe: 5.0 x 2.1 x 2.2 cm   _________________________________________________________   Estimated total number of nodules >/= 1 cm: 7   Number of spongiform nodules >/=  2 cm not described below (TR1): 0   Number of mixed cystic and solid nodules >/= 1.5 cm not described below (TR2): 0   _________________________________________________________   Nodule # 1:   Location: Right; superior   Maximum size: 1.3 cm; Other 2 dimensions: 1.3 x 1.0 cm   Composition: solid/almost completely solid (2)   Echogenicity: hypoechoic (2)   Shape: taller-than-wide (3)   Margins: smooth (0)   Echogenic foci: none (0)   ACR TI-RADS total points: 7.   ACR TI-RADS risk category: TR5 (>/= 7 points).   ACR TI-RADS recommendations:   **Given size (>/= 1.0 cm) and appearance, fine needle aspiration of this highly suspicious nodule should  be considered based on TI-RADS criteria.   _________________________________________________________   Nodule # 2:   Location: Right; superior   Maximum size: 2.2 cm; Other 2 dimensions: 1.7 x 1.7 cm   Composition: solid/almost completely solid (2)   Echogenicity: isoechoic (1)   Shape: taller-than-wide (3)   Margins: smooth (0)   Echogenic foci: none (0)   ACR TI-RADS total points: 4.   ACR TI-RADS risk category: TR4 (4-6 points).   ACR TI-RADS recommendations:   **Given size (>/= 1.5 cm) and appearance, fine needle aspiration of this moderately suspicious nodule should be considered based on TI-RADS criteria.   _________________________________________________________   Nodule # 3:   Location: Right; Mid   Maximum size: 2.3 cm; Other 2 dimensions: 1.7 x 2.2 cm   Composition:  solid/almost completely solid (2)   Echogenicity: hypoechoic (2)   Shape: not taller-than-wide (0)   Margins: smooth (0)   Echogenic foci: none (0)   ACR TI-RADS total points: 4.   ACR TI-RADS risk category: TR4 (4-6 points).   ACR TI-RADS recommendations:   **Given size (>/= 1.5 cm) and appearance, fine needle aspiration of this moderately suspicious nodule should be considered based on TI-RADS criteria.   _________________________________________________________   Nodule # 4:   Location: Right; Inferior   Maximum size: 1.6 cm; Other 2 dimensions: 1.0 x 1.2 cm   Composition: mixed cystic and solid (1)   Echogenicity: isoechoic (1)   Shape: not taller-than-wide (0)   Margins: smooth (0)   Echogenic foci: none (0)   ACR TI-RADS total points: 2.   ACR TI-RADS risk category: TR2 (2 points).   ACR TI-RADS recommendations:   This nodule does NOT meet TI-RADS criteria for biopsy or dedicated follow-up.   _________________________________________________________   Nodule # 5:   Location: Left; superior   Maximum size: 1.8 cm; Other 2 dimensions: 1.3 x 1.8 cm   Composition: solid/almost completely solid (2)   Echogenicity: isoechoic (1)   Shape: not taller-than-wide (0)   Margins: smooth (0)   Echogenic foci: none (0)   ACR TI-RADS total points: 3.   ACR TI-RADS risk category: TR3 (3 points).   ACR TI-RADS recommendations:   *Given size (>/= 1.5 - 2.4 cm) and appearance, a follow-up ultrasound in 1 year should be considered based on TI-RADS criteria.   _________________________________________________________   Nodule # 6:   Location: Left; mid   Maximum size: 1.9 cm; Other 2 dimensions: 1.7 x 1.8 cm   Composition: solid/almost completely solid (2)   Echogenicity: isoechoic (1)   Shape: taller-than-wide (3)   Margins: ill-defined (0)   Echogenic foci: none (0)   ACR TI-RADS total points: 6.   ACR TI-RADS risk category: TR4 (4-6  points).   ACR TI-RADS recommendations:   **Given size (>/= 1.5 cm) and appearance, fine needle aspiration of this moderately suspicious nodule should be considered based on TI-RADS criteria.   _________________________________________________________   Nodule # 7: 1.1 cm cystic nodule in the inferior left thyroid lobe does not meet criteria for FNA or imaging follow-up.   IMPRESSION: 1. Nodule 1 (TI-RADS 5) located in the superior right thyroid lobe, measuring 1.3 x 1.3 x 1.0 cm, meets criteria for FNA. 2. Nodule 2 (TI-RADS 4), located in the superior right thyroid lobe, measuring 2.2 x 1.7 x 1.7 cm, meets criteria for FNA. 3. Nodule 3 (TI-RADS 4) located in the mid right thyroid lobe, measuring 2.3 x 1.7 x 2.2 cm, meets criteria for FNA. 4. Nodule 5 (TI-RADS  3) located in the superior left thyroid lobe, measuring 1.8 x 1.3 x 1.8 cm, meets criteria for imaging follow-up. Next ultrasound should be performed in 1 year. 5. Nodule 6 (TI-RADS 4), located in the mid left thyroid lobe, measuring 1.9 x 1.7 x 1.8 cm, meets criteria for FNA. 6. Order of suspicion of nodules for consideration for FNA: 1, 3, 2, 6   The above is in keeping with the ACR TI-RADS recommendations - J Am Coll Radiol 2017;14:587-595.     Electronically Signed   By: Miachel Roux M.D.   On: 04/29/2020 16:12 -------------------------------------------------------------------------------------------------------------------------- Cytology - Non PAP  CASE: [id number]  PATIENT: [name]  Non-Gynecological Cytology Report    FNA Thyroid Nodule Biopsy  Clinical History: 2.3 cm RML  Specimen Submitted:  A. THYROID, RML, FINE NEEDLE ASPIRATION:    FINAL MICROSCOPIC DIAGNOSIS:  - Atypia of undetermined significance (Bethesda category III)   SPECIMEN ADEQUACY:  Satisfactory for evaluation   DIAGNOSTIC COMMENTS:  This specimen will be sent for Afirma testing.   GROSS:  Received is/are 3 slides in  95% Ethyl alcohol, 3 air dried slides for  diff stain, and 30 ccs of pale pink Cytolyt solution. (CM:cm)  Prepared:  Smears:  6  Concentration Method (Thin Prep):  1  Cell Block:  Cell block attempted, not obtained.  Additional Studies:  Also there was an Afirma collected. For RUL Thyroid  FNA, see [id number].   CYTOLOGY - NON PAP  CASE: [id number]  PATIENT: [name]  Non-Gynecological Cytology Report          FNA Thyroid Nodule Biopsy  Clinical History: 1.3 cm RUL  Specimen Submitted:  A. THYROID, RUL, FINE NEEDLE ASPIRATION:    FINAL MICROSCOPIC DIAGNOSIS:  - Consistent with benign follicular nodule (Bethesda category II)   SPECIMEN ADEQUACY:  Satisfactory for evaluation   GROSS:  Received is/are 4 slides in 95% Ethyl alcohol, 4 air dried slides for  diff stain, and 30 ccs of pale pink Cytolyt solution. (CM:cm)  Prepared:  Smears:  8  Concentration Method (Thin Prep):  1  Cell Block:  Cell block attempted, not obtained.  Additional Studies:  Also there was an Afirma collected. For RML Thyroid  FNA, see [id number].   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  She is advised to lower her dose of Levothyroxine to 50 mcg po daily before breakfast.    - We discussed about the correct intake of her thyroid hormone, on empty stomach at fasting, with water, separated by at least 30 minutes from breakfast and other medications,  and separated by more than 4 hours from calcium, iron, multivitamins, acid reflux medications (PPIs). -Patient is made aware of the fact that thyroid hormone replacement is needed for life, dose to be adjusted by periodic monitoring of thyroid function tests.   2. Multinodular Goiter -Her thyroid ultrasound shows multinodular goiter with 4 moderately suspicious nodules that meet criteria for FNA and 1 nodule recommending follow up with  ultrasound in 1 year.   All biopsies were confirmed to be benign.  I have ordered follow up thyroid ultrasound prior to next visit.    - she is advised to maintain close follow up with Ivy Lynn, NP for primary care needs.  She asked me if her Metformin could be causing the metallic taste in her mouth, in which it is a possibility, but it is affecting her eating pattern.  She asked if she could stop taking it for about a week to see if her symptoms improved.  I told her from an endocrinology stand point that should be ok, especially since her most recent A1c was stable at 6.5% but she should still check with her PCP first.     I spent 20 minutes in the care of the patient today including review of labs from Thyroid Function, CMP, and other relevant labs ; imaging/biopsy records (current and previous including abstractions from other facilities); face-to-face time discussing  her lab results and symptoms, medications doses, her options of short and long term treatment based on the latest standards of care / guidelines;   and documenting the encounter.  [name]  participated in the discussions, expressed understanding, and voiced agreement with the above plans.  All questions were answered to her satisfaction. she is encouraged to contact clinic should she have any questions or concerns prior to her return visit.   Follow up plan: Return in about 6 months (around 08/11/2021) for Thyroid follow up, Previsit labs, thyroid ultrasound.   Rayetta Pigg, [facility] [address] [place], [place] [address] Phone: [phone] Fax: [phone]   02/10/2021, 10:46 AM

## 2021-02-10 NOTE — Patient Instructions (Signed)

## 2021-02-10 NOTE — Progress Notes (Signed)
Carelink Summary Report / Loop Recorder 

## 2021-02-10 NOTE — Telephone Encounter (Signed)
Reaching out to patient to offer assistance regarding upcoming cardiac imaging study; pt verbalizes understanding of appt date/time, parking situation and where to check in, pre-test NPO status and medications ordered, and verified current allergies; name and call back number provided for further questions should they arise Marchia Bond RN [facility] [phone] office [phone] cell  Daughter requesting an earlier appt. I explained that our schedule is full today. She also was requesting an earlier procedure appt on 02/16/21. I will forward this to Allred/Tina to look into.  Understands that arrival time today 4:00p Holding metformin 48 hr post

## 2021-02-15 NOTE — Pre-Procedure Instructions (Signed)
Instructed patient on the following items: Arrival time 1230 Nothing to eat or drink after midnight No meds AM of procedure Responsible person to drive you home and stay with you for 24 hrs  Have you missed any doses of anti-coagulant Eliquis- hasn't missed any doses

## 2021-02-16 ENCOUNTER — Ambulatory Visit (HOSPITAL_COMMUNITY): Payer: Medicare Other | Admitting: Anesthesiology

## 2021-02-16 ENCOUNTER — Encounter (HOSPITAL_COMMUNITY)
Admission: RE | Disposition: A | Discharge status: Home / Self Care | Payer: Self-pay | Source: Home / Self Care | Attending: Internal Medicine

## 2021-02-16 ENCOUNTER — Ambulatory Visit (HOSPITAL_COMMUNITY)
Admission: RE | Admit: 2021-02-16 | Discharge: 2021-02-16 | Disposition: A | Discharge status: Home / Self Care | Payer: Medicare Other | Attending: Internal Medicine | Admitting: Internal Medicine

## 2021-02-16 ENCOUNTER — Other Ambulatory Visit: Payer: Self-pay

## 2021-02-16 DIAGNOSIS — I484 Atypical atrial flutter: Secondary | ICD-10-CM | POA: Diagnosis not present

## 2021-02-16 DIAGNOSIS — Z7901 Long term (current) use of anticoagulants: Secondary | ICD-10-CM | POA: Diagnosis not present

## 2021-02-16 DIAGNOSIS — I4819 Other persistent atrial fibrillation: Secondary | ICD-10-CM | POA: Diagnosis not present

## 2021-02-16 DIAGNOSIS — I509 Heart failure, unspecified: Secondary | ICD-10-CM | POA: Diagnosis not present

## 2021-02-16 DIAGNOSIS — E119 Type 2 diabetes mellitus without complications: Secondary | ICD-10-CM | POA: Diagnosis not present

## 2021-02-16 DIAGNOSIS — I25118 Atherosclerotic heart disease of native coronary artery with other forms of angina pectoris: Secondary | ICD-10-CM | POA: Diagnosis not present

## 2021-02-16 DIAGNOSIS — I110 Hypertensive heart disease with heart failure: Secondary | ICD-10-CM | POA: Diagnosis not present

## 2021-02-16 DIAGNOSIS — I4891 Unspecified atrial fibrillation: Secondary | ICD-10-CM | POA: Diagnosis not present

## 2021-02-16 HISTORY — PX: ATRIAL FIBRILLATION ABLATION: EP1191

## 2021-02-16 LAB — GLUCOSE, CAPILLARY
Glucose-Capillary: 123 mg/dL — ABNORMAL HIGH (ref 70–99)
Glucose-Capillary: 126 mg/dL — ABNORMAL HIGH (ref 70–99)

## 2021-02-16 LAB — POCT ACTIVATED CLOTTING TIME
Activated Clotting Time: 347 seconds
Activated Clotting Time: 347 seconds

## 2021-02-16 LAB — NO BLOOD PRODUCTS

## 2021-02-16 SURGERY — ATRIAL FIBRILLATION ABLATION
Anesthesia: General

## 2021-02-16 MED ORDER — SODIUM CHLORIDE 0.9 % IV SOLN: INTRAVENOUS | Status: DC

## 2021-02-16 MED ORDER — HEPARIN SODIUM (PORCINE) 1000 UNIT/ML IJ SOLN
INTRAMUSCULAR | Status: AC
  Filled 2021-02-16: qty 20

## 2021-02-16 MED ORDER — SODIUM CHLORIDE 0.9 % IV SOLN: 250.0000 mL | INTRAVENOUS | Status: DC | PRN

## 2021-02-16 MED ORDER — SODIUM CHLORIDE 0.9% FLUSH: 3.0000 mL | INTRAVENOUS | Status: DC | PRN

## 2021-02-16 MED ORDER — FENTANYL CITRATE (PF) 250 MCG/5ML IJ SOLN
INTRAMUSCULAR | Status: DC | PRN
  Administered 2021-02-16: 50 ug via INTRAVENOUS

## 2021-02-16 MED ORDER — HYDROCODONE-ACETAMINOPHEN 5-325 MG PO TABS: 1.0000 | ORAL_TABLET | ORAL | Status: DC | PRN

## 2021-02-16 MED ORDER — ROCURONIUM BROMIDE 10 MG/ML (PF) SYRINGE
PREFILLED_SYRINGE | INTRAVENOUS | Status: DC | PRN
Start: 2021-02-16 — End: 2021-02-16
  Administered 2021-02-16: 70 mg via INTRAVENOUS

## 2021-02-16 MED ORDER — ESMOLOL HCL 100 MG/10ML IV SOLN
INTRAVENOUS | Status: DC | PRN
  Administered 2021-02-16: 20 mg via INTRAVENOUS

## 2021-02-16 MED ORDER — SODIUM CHLORIDE 0.9% FLUSH: 3.0000 mL | Freq: Two times a day (BID) | INTRAVENOUS | Status: DC

## 2021-02-16 MED ORDER — ONDANSETRON HCL 4 MG/2ML IJ SOLN: 4.0000 mg | Freq: Four times a day (QID) | INTRAMUSCULAR | Status: DC | PRN

## 2021-02-16 MED ORDER — PROTAMINE SULFATE 10 MG/ML IV SOLN
INTRAVENOUS | Status: DC | PRN
Start: 2021-02-16 — End: 2021-02-16
  Administered 2021-02-16: 40 mg via INTRAVENOUS

## 2021-02-16 MED ORDER — SUGAMMADEX SODIUM 200 MG/2ML IV SOLN
INTRAVENOUS | Status: DC | PRN
  Administered 2021-02-16: 200 mg via INTRAVENOUS

## 2021-02-16 MED ORDER — HEPARIN (PORCINE) IN NACL 1000-0.9 UT/500ML-% IV SOLN
INTRAVENOUS | Status: DC | PRN
  Administered 2021-02-16 (×2): 500 mL

## 2021-02-16 MED ORDER — PHENYLEPHRINE HCL-NACL 20-0.9 MG/250ML-% IV SOLN
INTRAVENOUS | Status: DC | PRN
  Administered 2021-02-16: 30 ug/min via INTRAVENOUS

## 2021-02-16 MED ORDER — APIXABAN 5 MG PO TABS
5.0000 mg | ORAL_TABLET | ORAL | Status: AC
  Administered 2021-02-16: 17:00:00 5 mg via ORAL
  Filled 2021-02-16: qty 1

## 2021-02-16 MED ORDER — PROPOFOL 10 MG/ML IV BOLUS
INTRAVENOUS | Status: DC | PRN
  Administered 2021-02-16: 140 mg via INTRAVENOUS

## 2021-02-16 MED ORDER — ACETAMINOPHEN 325 MG PO TABS: 650.0000 mg | ORAL_TABLET | ORAL | Status: DC | PRN

## 2021-02-16 MED ORDER — LIDOCAINE 2% (20 MG/ML) 5 ML SYRINGE
INTRAMUSCULAR | Status: DC | PRN
Start: 2021-02-16 — End: 2021-02-16
  Administered 2021-02-16: 100 mg via INTRAVENOUS

## 2021-02-16 MED ORDER — HEPARIN SODIUM (PORCINE) 1000 UNIT/ML IJ SOLN
INTRAMUSCULAR | Status: DC | PRN
Start: 2021-02-16 — End: 2021-02-16
  Administered 2021-02-16: 1000 [IU] via INTRAVENOUS
  Administered 2021-02-16: 2000 [IU] via INTRAVENOUS

## 2021-02-16 MED ORDER — PANTOPRAZOLE SODIUM 40 MG PO TBEC: 40.0000 mg | DELAYED_RELEASE_TABLET | Freq: Every day | ORAL | 0 refills | Status: DC

## 2021-02-16 MED ORDER — ONDANSETRON HCL 4 MG/2ML IJ SOLN
INTRAMUSCULAR | Status: DC | PRN
  Administered 2021-02-16: 4 mg via INTRAVENOUS

## 2021-02-16 MED ORDER — PHENYLEPHRINE HCL (PRESSORS) 10 MG/ML IV SOLN
INTRAVENOUS | Status: DC | PRN
Start: 2021-02-16 — End: 2021-02-16
  Administered 2021-02-16 (×3): 80 ug via INTRAVENOUS
  Administered 2021-02-16: 40 ug via INTRAVENOUS

## 2021-02-16 MED ORDER — HEPARIN SODIUM (PORCINE) 1000 UNIT/ML IJ SOLN
INTRAMUSCULAR | Status: DC | PRN
  Administered 2021-02-16: 1000 [IU] via INTRAVENOUS

## 2021-02-16 SURGICAL SUPPLY — 19 items
CATH 8FR REPROCESSED SOUNDSTAR (CATHETERS) IMPLANT
CATH 8FR SOUNDSTAR REPROCESSED (CATHETERS) IMPLANT
CATH OCTARAY 2.0 F 3-3-3-3-3 (CATHETERS) ×2 IMPLANT
CATH SMTCH THERMOCOOL SF DF (CATHETERS) ×2 IMPLANT
CATH SOUNDSTAR ECO 8FR (CATHETERS) ×2 IMPLANT
CATH WEBSTER BI DIR CS D-F CRV (CATHETERS) ×2 IMPLANT
CLOSURE PERCLOSE PROSTYLE (VASCULAR PRODUCTS) ×6 IMPLANT
COVER SWIFTLINK CONNECTOR (BAG) ×5 IMPLANT
NDL BAYLIS TRANSSEPTAL 71CM (NEEDLE) IMPLANT
NEEDLE BAYLIS TRANSSEPTAL 71CM (NEEDLE) ×3 IMPLANT
PACK EP LATEX FREE (CUSTOM PROCEDURE TRAY) ×3
PACK EP LF (CUSTOM PROCEDURE TRAY) ×1 IMPLANT
PAD DEFIB RADIO PHYSIO CONN (PAD) ×3 IMPLANT
PATCH CARTO3 (PAD) ×2 IMPLANT
SHEATH PINNACLE 7F 10CM (SHEATH) ×4 IMPLANT
SHEATH PINNACLE 9F 10CM (SHEATH) ×2 IMPLANT
SHEATH PROBE COVER 6X72 (BAG) ×2 IMPLANT
SHEATH SWARTZ TS SL2 63CM 8.5F (SHEATH) ×2 IMPLANT
TUBING SMART ABLATE COOLFLOW (TUBING) ×2 IMPLANT

## 2021-02-16 NOTE — Transfer of Care (Signed)
Immediate Anesthesia Transfer of Care Note  Patient: [name]  Procedure(s) Performed: ATRIAL FIBRILLATION ABLATION  Patient Location: PACU and Cath Lab  Anesthesia Type:General  Level of Consciousness: drowsy  Airway & Oxygen Therapy: Patient Spontanous Breathing and Patient connected to nasal cannula oxygen  Post-op Assessment: Report given to RN and Post -op Vital signs reviewed and stable  Post vital signs: Reviewed and stable  Last Vitals:  Vitals Value Taken Time  BP 153/65 02/16/21 1617  Temp 36.3 C 02/16/21 1617  Pulse 76 02/16/21 1620  Resp 16 02/16/21 1620  SpO2 100 % 02/16/21 1620  Vitals shown include unvalidated device data.  Last Pain:  Vitals:   02/16/21 1617  TempSrc: Temporal  PainSc: Asleep         Complications: No notable events documented.

## 2021-02-16 NOTE — H&P (Signed)
PCP: Ivy Lynn, NP Primary Cardiologist: Dr Domenic Polite Primary EP: Dr Melina Schools [name] is a 80 y.o. female who presents today for electrophysiology study and ablation for afib.  Since last being seen in our clinic, the patient reports doing reasonably well.  She developed afib again and was hospitalized in November.  She converted to sinus.  She is doing better now.  Today, she denies symptoms of palpitations, chest pain, shortness of breath,  lower extremity edema, dizziness, presyncope, or syncope.  The patient is otherwise without complaint today.        Past Medical History:  Diagnosis Date   Anxiety     Arthritis     Bulging lumbar disc     Chronic lower back pain     Coronary atherosclerosis of native coronary artery      DES LAD 02/2008, DES LAD 02/2015, DES ramus and DES x2 mid RCA 02/2020   Depression     Dyslipidemia     Essential hypertension     Facial numbness     Frequent headaches     GERD (gastroesophageal reflux disease)     Glaucoma     Heart attack ([place])     Insomnia     Paroxysmal atrial fibrillation [facility])      Diagnosed October 2019   PSVT (paroxysmal supraventricular tachycardia) ([place])     Refusal of blood transfusions as patient is Jehovah's Witness     Thyroid disease     Type 2 diabetes mellitus ([place])      Borderline   Vitamin D deficiency           Past Surgical History:  Procedure Laterality Date   CARDIAC CATHETERIZATION N/A 02/23/2015    Procedure: Left Heart Cath and Coronary Angiography;  Surgeon: Sherren Mocha, MD;  Location: [place] [facility];  Service: Cardiovascular;  Laterality: N/A;   CARDIAC CATHETERIZATION N/A 02/23/2015    Procedure: Coronary Stent Intervention;  Surgeon: Sherren Mocha, MD;  Location: [place] [facility];  Service: Cardiovascular;  Laterality: N/A;   CARDIAC CATHETERIZATION N/A 01/04/2016    Procedure: Left Heart Cath and Coronary Angiography;  Surgeon: Peter M Martinique, MD;  Location: [place] [facility];  Service:  Cardiovascular;  Laterality: N/A;   CATARACT EXTRACTION       CORONARY ANGIOPLASTY   02/23/2015   CORONARY ANGIOPLASTY WITH STENT PLACEMENT   2009   CORONARY STENT INTERVENTION N/A 03/09/2020    Procedure: CORONARY STENT INTERVENTION;  Surgeon: Jettie Booze, MD;  Location: [place] [facility];  Service: Cardiovascular;  Laterality: N/A;   DILATION AND CURETTAGE OF UTERUS       implantable loop recorder placement   04/01/2020    Medtronic Reveal Linq model LNQ [id number]) implantable loop recorder    LEFT HEART CATH AND CORONARY ANGIOGRAPHY N/A 03/09/2020    Procedure: LEFT HEART CATH AND CORONARY ANGIOGRAPHY;  Surgeon: Jettie Booze, MD;  Location: [place] [facility];  Service: Cardiovascular;  Laterality: N/A;   TUBAL LIGATION          ROS- all systems are reviewed and negatives except as per HPI above         Current Outpatient Medications  Medication Sig Dispense Refill   apixaban (ELIQUIS) 5 MG TABS tablet Take 1 tablet (5 mg total) by mouth 2 (two) times daily. 180 tablet 3   Ascorbic Acid (VITAMIN C) 500 MG tablet Take 500 mg by mouth daily.  bimatoprost (LUMIGAN) 0.01 % SOLN Place 1 drop into both eyes at bedtime.       Calcium Carbonate Antacid (TUMS PO) Take 2 tablets by mouth 4 (four) times daily as needed (for acid reflux/indigestion).        Cholecalciferol (VITAMIN D) 400 UNITS capsule Take 400 Units by mouth daily.         ciclopirox (PENLAC) 8 % solution Apply 1 application topically at bedtime.       clopidogrel (PLAVIX) 75 MG tablet Take 1 tablet (75 mg total) by mouth daily with breakfast. 90 tablet 3   Cyanocobalamin (VITAMIN B 12 PO) Take 1 tablet by mouth daily.       diclofenac Sodium (VOLTAREN) 1 % GEL Apply 2 g topically 4 (four) times daily. 50 g 2   diltiazem (CARDIZEM CD) 300 MG 24 hr capsule Take 1 capsule (300 mg total) by mouth daily. 90 capsule 3   Garlic 245 MG TABS Take 1 tablet by mouth daily.       HYDROcodone-acetaminophen (NORCO)  5-325 MG tablet Take 1 tablet by mouth every 6 (six) hours as needed for moderate pain. 120 tablet 0   levothyroxine (SYNTHROID) 75 MCG tablet Take 1 tablet (75 mcg total) by mouth daily before breakfast. 30 tablet 3   losartan (COZAAR) 50 MG tablet Take 1 tablet (50 mg total) by mouth daily. 90 tablet 3   MAGNESIUM CARBONATE PO Take 1 tablet by mouth daily. 400 mg       metFORMIN (GLUCOPHAGE-XR) 500 MG 24 hr tablet Take 1 tablet (500 mg total) by mouth daily with breakfast. 30 tablet 4   metoprolol succinate (TOPROL XL) 100 MG 24 hr tablet Take 1 tablet (100 mg total) by mouth daily. Take with or immediately following a meal. 30 tablet 11   nitroGLYCERIN (NITROSTAT) 0.4 MG SL tablet Place 0.4 mg under the tongue every 5 (five) minutes as needed for chest pain.       promethazine (PHENERGAN) 25 MG tablet 1 tablet as needed.       rosuvastatin (CRESTOR) 20 MG tablet TAKE ONE (1) TABLET BY MOUTH EVERY DAY 90 tablet 3   timolol (TIMOPTIC) 0.5 % ophthalmic solution Place 1 drop into both eyes daily.       traZODone (DESYREL) 100 MG tablet TAKE ONE TABLET BY MOUTH AT BEDTIME 90 tablet 0   TURMERIC PO Take 1 tablet by mouth daily.       VENTOLIN HFA 108 (90 Base) MCG/ACT inhaler Inhale 1-2 puffs into the lungs every 4 (four) hours as needed for shortness of breath. 18 g 1   furosemide (LASIX) 20 MG tablet Take 1 tablet (20 mg total) by mouth daily for 10 days. 10 tablet 0   potassium chloride (KLOR-CON) 10 MEQ tablet Take 1 tablet (10 mEq total) by mouth daily for 10 days. Take While taking Lasix/furosemide 10 tablet 0    No current facility-administered medications for this visit.      Physical Exam: Vitals:   02/16/21 1242  BP: (!) 171/46  Pulse: 67  Resp: 16  Temp: 98.1 F (36.7 C)  SpO2: 97%   GEN- The patient is well appearing, alert and oriented x 3 today.   Head- normocephalic, atraumatic Eyes-  Sclera clear, conjunctiva pink Ears- hearing intact Oropharynx- clear Lungs- Clear to  ausculation bilaterally, normal work of breathing Heart- Regular rate and rhythm, no murmurs, rubs or gallops, PMI not laterally displaced GI- soft, NT, ND, + BS Extremities- no  clubbing, cyanosis, or edema     Assessment and Plan:   Persistent atrial fibrillation Doing reasonably well.    she was hospitalized for AF in November. Chads2vasc score is 6.  She is on eliquis    Risk, benefits, and alternatives to EP study and radiofrequency ablation for afib were again discussed in detail today. These risks include but are not limited to stroke, bleeding, vascular damage, tamponade, perforation, damage to the esophagus, lungs, and other structures, pulmonary vein stenosis, worsening renal function, and death. The patient understands these risk and wishes to proceed.    Cardiac CT reviewed at length with the patient today.  she reports compliance with [place] without interruption.  Thompson Grayer MD, [place] 02/16/2021 1:02 PM

## 2021-02-16 NOTE — Anesthesia Preprocedure Evaluation (Signed)
Anesthesia Evaluation  Patient identified by MRN, date of birth, ID band Patient awake    Reviewed: Allergy & Precautions, NPO status , Patient's Chart, lab work & pertinent test results  Airway Mallampati: II  TM Distance: >3 FB Neck ROM: Full    Dental no notable dental hx.    Pulmonary    breath sounds clear to auscultation       Cardiovascular hypertension, Pt. on medications and Pt. on home beta blockers + CAD, + Cardiac Stents and +CHF  + dysrhythmias Atrial Fibrillation  Rhythm:Irregular Rate:Normal     Neuro/Psych  Headaches, PSYCHIATRIC DISORDERS Anxiety Depression    GI/Hepatic Neg liver ROS, GERD  ,  Endo/Other  diabetes, Type 2, Oral Hypoglycemic AgentsHypothyroidism   Renal/GU negative Renal ROS     Musculoskeletal  (+) Arthritis ,   Abdominal Normal abdominal exam  (+)   Peds  Hematology   Anesthesia Other Findings   Reproductive/Obstetrics                             Anesthesia Physical Anesthesia Plan  ASA: 3  Anesthesia Plan: General   Post-op Pain Management:    Induction: Intravenous  PONV Risk Score and Plan: 3 and Ondansetron  Airway Management Planned: Oral ETT  Additional Equipment: None  Intra-op Plan:   Post-operative Plan: Extubation in OR  Informed Consent: I have reviewed the patients History and Physical, chart, labs and discussed the procedure including the risks, benefits and alternatives for the proposed anesthesia with the patient or authorized representative who has indicated his/her understanding and acceptance.       Plan Discussed with: CRNA  Anesthesia Plan Comments:         Anesthesia Quick Evaluation

## 2021-02-16 NOTE — Anesthesia Procedure Notes (Signed)
Procedure Name: Intubation Date/Time: 02/16/2021 1:49 PM Performed by: Vonna Drafts, CRNA Pre-anesthesia Checklist: Patient identified, Emergency Drugs available, Suction available and Patient being monitored Patient Re-evaluated:Patient Re-evaluated prior to induction Oxygen Delivery Method: Circle system utilized Preoxygenation: Pre-oxygenation with 100% oxygen Induction Type: IV induction Ventilation: Mask ventilation without difficulty Laryngoscope Size: Mac and 3 Grade View: Grade I Tube type: Oral Tube size: 7.0 mm Number of attempts: 1 Airway Equipment and Method: Stylet and Oral airway Placement Confirmation: ETT inserted through vocal cords under direct vision, positive ETCO2 and breath sounds checked- equal and bilateral Secured at: 21 cm Tube secured with: Tape Dental Injury: Teeth and Oropharynx as per pre-operative assessment

## 2021-02-16 NOTE — Progress Notes (Signed)
Pt ambulated without difficulty or bleeding.   Discharged home with her daughter who will drive and stay with pt x 24 hrs. 

## 2021-02-16 NOTE — Discharge Instructions (Signed)

## 2021-02-17 ENCOUNTER — Encounter (HOSPITAL_COMMUNITY): Payer: Self-pay | Admitting: Internal Medicine

## 2021-02-17 NOTE — Anesthesia Postprocedure Evaluation (Signed)
Anesthesia Post Note  Patient: [name]  Procedure(s) Performed: ATRIAL FIBRILLATION ABLATION     Patient location during evaluation: PACU Anesthesia Type: General Level of consciousness: awake and alert Pain management: pain level controlled Vital Signs Assessment: post-procedure vital signs reviewed and stable Respiratory status: spontaneous breathing, nonlabored ventilation, respiratory function stable and patient connected to nasal cannula oxygen Cardiovascular status: blood pressure returned to baseline and stable Postop Assessment: no apparent nausea or vomiting Anesthetic complications: no   No notable events documented.            Effie Berkshire

## 2021-02-22 ENCOUNTER — Telehealth (HOSPITAL_COMMUNITY): Payer: Self-pay | Admitting: Emergency Medicine

## 2021-02-22 MED ORDER — FUROSEMIDE 20 MG PO TABS: 20.0000 mg | ORAL_TABLET | Freq: Every day | ORAL | Status: DC | PRN

## 2021-02-22 NOTE — Telephone Encounter (Signed)
Swelling in lower extremity. Groin is sore and bruised but soft to touch. Discussed with Adline Peals PA will take a 20mg  of lasix now and she will call in AM with update. Pt in agreement.

## 2021-02-22 NOTE — Telephone Encounter (Signed)
Pt reports R leg swelling at groin site and down to her feet and ankles since Sunday night when she was getting changed for bed. Some tenderness and bruising, denies pulsating.   Ablation procedure on 02/16/21.   Please return call to daughters phone [place] [phone]

## 2021-02-28 ENCOUNTER — Ambulatory Visit (INDEPENDENT_AMBULATORY_CARE_PROVIDER_SITE_OTHER): Payer: Medicare Other

## 2021-02-28 ENCOUNTER — Telehealth (HOSPITAL_COMMUNITY): Payer: Self-pay | Admitting: *Deleted

## 2021-02-28 VITALS — Ht 64.0 in | Wt 165.0 lb

## 2021-02-28 DIAGNOSIS — Z0000 Encounter for general adult medical examination without abnormal findings: Secondary | ICD-10-CM

## 2021-02-28 NOTE — Patient Instructions (Addendum)
[name] , Thank you for taking time to come for your Medicare Wellness Visit. I appreciate your ongoing commitment to your health goals. Please review the following plan we discussed and let me know if I can assist you in the future.   Screening recommendations/referrals: Colonoscopy: No longer required Mammogram: No longer required Bone Density: Done 12/03/2020 - Repeat every 2 years  Recommended yearly ophthalmology/optometry visit for glaucoma screening and checkup Recommended yearly dental visit for hygiene and checkup  Vaccinations: Influenza vaccine: Done 12/03/2020 - Repeat annually  Pneumococcal vaccine: Done 01/03/2019 & 03/11/2020 Tdap vaccine: Done 12/24/2018 - Repeat in 10 years Shingles vaccine: Due   Covid-19:Done 04/01/2019, 04/29/2019, 12/31/2019, 07/04/2020, & 01/18/2021  Advanced directives: Please bring a copy of your health care power of attorney and living will to the office to be added to your chart at your convenience.   Conditions/risks identified: Aim for 30 minutes of exercise or walking each day, drink 6-8 glasses of water and eat lots of fruits and vegetables.   Next appointment: Follow up in one year for your annual wellness visit    Preventive Care 65 Years and Older, Female Preventive care refers to lifestyle choices and visits with your health care provider that can promote health and wellness. What does preventive care include? A yearly physical exam. This is also called an annual well check. Dental exams once or twice a year. Routine eye exams. Ask your health care provider how often you should have your eyes checked. Personal lifestyle choices, including: Daily care of your teeth and gums. Regular physical activity. Eating a healthy diet. Avoiding tobacco and drug use. Limiting alcohol use. Practicing safe sex. Taking low-dose aspirin every day. Taking vitamin and mineral supplements as recommended by your health care provider. What happens during  an annual well check? The services and screenings done by your health care provider during your annual well check will depend on your age, overall health, lifestyle risk factors, and family history of disease. Counseling  Your health care provider may ask you questions about your: Alcohol use. Tobacco use. Drug use. Emotional well-being. Home and relationship well-being. Sexual activity. Eating habits. History of falls. Memory and ability to understand (cognition). Work and work Statistician. Reproductive health. Screening  You may have the following tests or measurements: Height, weight, and BMI. Blood pressure. Lipid and cholesterol levels. These may be checked every 5 years, or more frequently if you are over 28 years old. Skin check. Lung cancer screening. You may have this screening every year starting at age 58 if you have a 30-pack-year history of smoking and currently smoke or have quit within the past 15 years. Fecal occult blood test (FOBT) of the stool. You may have this test every year starting at age 49. Flexible sigmoidoscopy or colonoscopy. You may have a sigmoidoscopy every 5 years or a colonoscopy every 10 years starting at age 45. Hepatitis C blood test. Hepatitis B blood test. Sexually transmitted disease (STD) testing. Diabetes screening. This is done by checking your blood sugar (glucose) after you have not eaten for a while (fasting). You may have this done every 1-3 years. Bone density scan. This is done to screen for osteoporosis. You may have this done starting at age 13. Mammogram. This may be done every 1-2 years. Talk to your health care provider about how often you should have regular mammograms. Talk with your health care provider about your test results, treatment options, and if necessary, the need for more tests. Vaccines  Your health care provider may recommend certain vaccines, such as: Influenza vaccine. This is recommended every year. Tetanus,  diphtheria, and acellular pertussis (Tdap, Td) vaccine. You may need a Td booster every 10 years. Zoster vaccine. You may need this after age 66. Pneumococcal 13-valent conjugate (PCV13) vaccine. One dose is recommended after age 35. Pneumococcal polysaccharide (PPSV23) vaccine. One dose is recommended after age 56. Talk to your health care provider about which screenings and vaccines you need and how often you need them. This information is not intended to replace advice given to you by your health care provider. Make sure you discuss any questions you have with your health care provider. Document Released: 03/05/2015 Document Revised: 10/27/2015 Document Reviewed: 12/08/2014 Elsevier Interactive Patient Education  2017 [place] Prevention in the Home Falls can cause injuries. They can happen to people of all ages. There are many things you can do to make your home safe and to help prevent falls. What can I do on the outside of my home? Regularly fix the edges of walkways and driveways and fix any cracks. Remove anything that might make you trip as you walk through a door, such as a raised step or threshold. Trim any bushes or trees on the path to your home. Use bright outdoor lighting. Clear any walking paths of anything that might make someone trip, such as rocks or tools. Regularly check to see if handrails are loose or broken. Make sure that both sides of any steps have handrails. Any raised decks and porches should have guardrails on the edges. Have any leaves, snow, or ice cleared regularly. Use sand or salt on walking paths during winter. Clean up any spills in your garage right away. This includes oil or grease spills. What can I do in the bathroom? Use night lights. Install grab bars by the toilet and in the tub and shower. Do not use towel bars as grab bars. Use non-skid mats or decals in the tub or shower. If you need to sit down in the shower, use a plastic,  non-slip stool. Keep the floor dry. Clean up any water that spills on the floor as soon as it happens. Remove soap buildup in the tub or shower regularly. Attach bath mats securely with double-sided non-slip rug tape. Do not have throw rugs and other things on the floor that can make you trip. What can I do in the bedroom? Use night lights. Make sure that you have a light by your bed that is easy to reach. Do not use any sheets or blankets that are too big for your bed. They should not hang down onto the floor. Have a firm chair that has side arms. You can use this for support while you get dressed. Do not have throw rugs and other things on the floor that can make you trip. What can I do in the kitchen? Clean up any spills right away. Avoid walking on wet floors. Keep items that you use a lot in easy-to-reach places. If you need to reach something above you, use a strong step stool that has a grab bar. Keep electrical cords out of the way. Do not use floor polish or wax that makes floors slippery. If you must use wax, use non-skid floor wax. Do not have throw rugs and other things on the floor that can make you trip. What can I do with my stairs? Do not leave any items on the stairs. Make sure that there are  handrails on both sides of the stairs and use them. Fix handrails that are broken or loose. Make sure that handrails are as long as the stairways. Check any carpeting to make sure that it is firmly attached to the stairs. Fix any carpet that is loose or worn. Avoid having throw rugs at the top or bottom of the stairs. If you do have throw rugs, attach them to the floor with carpet tape. Make sure that you have a light switch at the top of the stairs and the bottom of the stairs. If you do not have them, ask someone to add them for you. What else can I do to help prevent falls? Wear shoes that: Do not have high heels. Have rubber bottoms. Are comfortable and fit you well. Are closed  at the toe. Do not wear sandals. If you use a stepladder: Make sure that it is fully opened. Do not climb a closed stepladder. Make sure that both sides of the stepladder are locked into place. Ask someone to hold it for you, if possible. Clearly mark and make sure that you can see: Any grab bars or handrails. First and last steps. Where the edge of each step is. Use tools that help you move around (mobility aids) if they are needed. These include: Canes. Walkers. Scooters. Crutches. Turn on the lights when you go into a dark area. Replace any light bulbs as soon as they burn out. Set up your furniture so you have a clear path. Avoid moving your furniture around. If any of your floors are uneven, fix them. If there are any pets around you, be aware of where they are. Review your medicines with your doctor. Some medicines can make you feel dizzy. This can increase your chance of falling. Ask your doctor what other things that you can do to help prevent falls. This information is not intended to replace advice given to you by your health care provider. Make sure you discuss any questions you have with your health care provider. Document Released: 12/03/2008 Document Revised: 07/15/2015 Document Reviewed: 03/13/2014 Elsevier Interactive Patient Education  2017 [employer].

## 2021-02-28 NOTE — Telephone Encounter (Signed)
Will bring in for assessment of groin tomorrow. Pt in agreement.

## 2021-02-28 NOTE — Telephone Encounter (Signed)
Returning a message to patient. Patient states she had her procedure where they went her groin and she has been having leg swelling for about a week. She states that it is on her right leg and she has been trying to prop her leg and taking medications but it has not improved. She states that she is able to walk on it and that it is painful. She denies any SOB. Advised patient she may need to seek medical attention to fully evaluate the issue.  She verbalized understanding. Informed her that I would reach out to the ablation team to see if they had any additional information for her.  Gordy Clement RN Navigator Cardiac Imaging [facility] [phone] Office [phone] Cell

## 2021-02-28 NOTE — Progress Notes (Signed)
Subjective:   [name] is a 81 y.o. female who presents for Medicare Annual (Subsequent) preventive examination.  Virtual Visit via Telephone Note  I connected with  [name] on 02/28/21 at  2:00 PM EST by telephone and verified that I am speaking with the correct person using two identifiers.  Location: Patient: Home Provider: [facility] Persons participating in the virtual visit: patient/Nurse Health Advisor   I discussed the limitations, risks, security and privacy concerns of performing an evaluation and management service by telephone and the availability of in person appointments. The patient expressed understanding and agreed to proceed.  Interactive audio and video telecommunications were attempted between this nurse and patient, however failed, due to patient having technical difficulties OR patient did not have access to video capability.  We continued and completed visit with audio only.  Some vital signs may be absent or patient reported.   Maydelin Deming E Floetta Brickey, LPN   Review of Systems     Cardiac Risk Factors include: advanced age (>10men, >73 women);diabetes mellitus;dyslipidemia;hypertension;sedentary lifestyle;Other (see comment), Risk factor comments: CHF, A.Fib, cardiomyopathy, CAD, hx of MI     Objective:    Today's Vitals   02/28/21 1356 02/28/21 1357  Weight: 165 lb (74.8 kg)   Height: 5\' 4"  (1.626 m)   PainSc:  0-No pain   Body mass index is 28.32 kg/m.  Advanced Directives 02/16/2021 01/09/2021 03/08/2020 02/26/2020 12/17/2017 12/17/2017 01/04/2016  Does Patient Have a Medical Advance Directive? Yes No No No No No No  Type of Advance Directive [place]  Does patient want to make changes to medical advance directive? No - Patient declined - - - - - -  Would patient like information on creating a medical advance directive? - No - Patient declined No - Patient declined No - Patient declined No - Patient declined No - Patient  declined No - patient declined information  Pre-existing out of facility DNR order (yellow form or pink MOST form) - - - - - - -    Current Medications (verified) Outpatient Encounter Medications as of 02/28/2021  Medication Sig   apixaban (ELIQUIS) 5 MG TABS tablet Take 1 tablet (5 mg total) by mouth 2 (two) times daily.   Ascorbic Acid (VITAMIN C) 500 MG tablet Take 500 mg by mouth daily.     bimatoprost (LUMIGAN) 0.01 % SOLN Place 1 drop into both eyes at bedtime.   Calcium Carbonate Antacid (TUMS PO) Take 2 tablets by mouth 4 (four) times daily as needed (for acid reflux/indigestion).    Camphor-Menthol-Methyl Sal (SALONPAS) 3.02-26-08 % PTCH Apply 1 patch topically daily as needed (pain).   Cholecalciferol (VITAMIN D) 400 UNITS capsule Take 400 Units by mouth daily.     ciclopirox (PENLAC) 8 % solution Apply 1 application topically at bedtime.   clopidogrel (PLAVIX) 75 MG tablet Take 1 tablet (75 mg total) by mouth daily with breakfast.   Cyanocobalamin (VITAMIN B 12 PO) Take 1 tablet by mouth daily.   diclofenac Sodium (VOLTAREN) 1 % GEL Apply 2 g topically 4 (four) times daily. (Patient taking differently: Apply 2 g topically 4 (four) times daily as needed (pain).)   diltiazem (CARDIZEM CD) 300 MG 24 hr capsule Take 1 capsule (300 mg total) by mouth daily.   furosemide (LASIX) 20 MG tablet Take 1 tablet (20 mg total) by mouth daily as needed for edema.   Garlic 185 MG TABS Take 1 tablet by mouth  at bedtime.   HYDROcodone-acetaminophen (NORCO) 5-325 MG tablet Take 1 tablet by mouth every 6 (six) hours as needed for moderate pain.   levothyroxine (SYNTHROID) 50 MCG tablet Take 1 tablet (50 mcg total) by mouth daily before breakfast.   losartan (COZAAR) 50 MG tablet Take 1 tablet (50 mg total) by mouth daily.   MAGNESIUM CARBONATE PO Take 1 tablet by mouth daily. 400 mg   metFORMIN (GLUCOPHAGE-XR) 500 MG 24 hr tablet Take 1 tablet (500 mg total) by mouth daily with breakfast.   metoprolol  succinate (TOPROL XL) 100 MG 24 hr tablet Take 1 tablet (100 mg total) by mouth daily. Take with or immediately following a meal.   nitroGLYCERIN (NITROSTAT) 0.4 MG SL tablet Place 0.4 mg under the tongue every 5 (five) minutes as needed for chest pain.   pantoprazole (PROTONIX) 40 MG tablet Take 1 tablet (40 mg total) by mouth daily.   promethazine (PHENERGAN) 25 MG tablet Take 1 tablet by mouth every 8 (eight) hours as needed for nausea or vomiting.   rosuvastatin (CRESTOR) 20 MG tablet TAKE ONE (1) TABLET BY MOUTH EVERY DAY   timolol (TIMOPTIC) 0.5 % ophthalmic solution Place 1 drop into both eyes daily.   traZODone (DESYREL) 100 MG tablet TAKE ONE TABLET BY MOUTH AT BEDTIME   TURMERIC PO Take 1 tablet by mouth daily.   VENTOLIN HFA 108 (90 Base) MCG/ACT inhaler Inhale 1-2 puffs into the lungs every 4 (four) hours as needed for shortness of breath.   No facility-administered encounter medications on file as of 02/28/2021.    Allergies (verified) Patient has no known allergies.   History: Past Medical History:  Diagnosis Date   Anxiety    Arthritis    Bulging lumbar disc    Chronic lower back pain    Coronary atherosclerosis of native coronary artery    DES LAD 02/2008, DES LAD 02/2015, DES ramus and DES x2 mid RCA 02/2020   Depression    Dyslipidemia    Essential hypertension    Facial numbness    Frequent headaches    GERD (gastroesophageal reflux disease)    Glaucoma    Heart attack ([place])    Insomnia    Paroxysmal atrial fibrillation ([place])    Diagnosed October 2019   PSVT (paroxysmal supraventricular tachycardia) ([place])    Refusal of blood transfusions as patient is Jehovah's Witness    Thyroid disease    Type 2 diabetes mellitus ([place])    Borderline   Vitamin D deficiency    Past Surgical History:  Procedure Laterality Date   ATRIAL FIBRILLATION ABLATION N/A 02/16/2021   Procedure: ATRIAL FIBRILLATION ABLATION;  Surgeon: Thompson Grayer, MD;  Location: [facility];   Service: Cardiovascular;  Laterality: N/A;   CARDIAC CATHETERIZATION N/A 02/23/2015   Procedure: Left Heart Cath and Coronary Angiography;  Surgeon: Sherren Mocha, MD;  Location: [place];  Service: Cardiovascular;  Laterality: N/A;   CARDIAC CATHETERIZATION N/A 02/23/2015   Procedure: Coronary Stent Intervention;  Surgeon: Sherren Mocha, MD;  Location: [facility];  Service: Cardiovascular;  Laterality: N/A;   CARDIAC CATHETERIZATION N/A 01/04/2016   Procedure: Left Heart Cath and Coronary Angiography;  Surgeon: Peter M Martinique, MD;  Location: [facility];  Service: Cardiovascular;  Laterality: N/A;   CATARACT EXTRACTION     CORONARY ANGIOPLASTY  02/23/2015   CORONARY ANGIOPLASTY WITH STENT PLACEMENT  2009   CORONARY STENT INTERVENTION N/A 03/09/2020   Procedure: CORONARY STENT INTERVENTION;  Surgeon: Jettie Booze, MD;  Location: [facility];  Service: Cardiovascular;  Laterality: N/A;   DILATION AND CURETTAGE OF UTERUS     implantable loop recorder placement  04/01/2020   Medtronic Reveal Linq model LNQ 22 [id number]) implantable loop recorder    LEFT HEART CATH AND CORONARY ANGIOGRAPHY N/A 03/09/2020   Procedure: LEFT HEART CATH AND CORONARY ANGIOGRAPHY;  Surgeon: Jettie Booze, MD;  Location: [facility];  Service: Cardiovascular;  Laterality: N/A;   TUBAL LIGATION     Family History  Problem Relation Age of Onset   Other Mother        "natural causes"   Heart disease Mother    Cancer Father        unsure of type   Social History   Socioeconomic History   Marital status: Widowed    Spouse name: Not on file   Number of children: 8   Years of education: 10th grade   Highest education level: 10th grade  Occupational History   Occupation: RETIRED  Tobacco Use   Smoking status: Never   Smokeless tobacco: Never  Vaping Use   Vaping Use: Never used  Substance and Sexual Activity   Alcohol use: Yes    Alcohol/week: 1.0 standard  drink    Types: 1 Glasses of wine per week    Comment: RARE OCCASION   Drug use: No   Sexual activity: Not Currently  Other Topics Concern   Not on file  Social History Narrative   Lives at home alone.   Right-handed.   No caffeine use.   Daughter lives next door and helps her out   Social Determinants of Health   Financial Resource Strain: Not on file  Food Insecurity: Not on file  Transportation Needs: Not on file  Physical Activity: Not on file  Stress: No Stress Concern Present   Feeling of Stress : Only a little  Social Connections: Not on file    Tobacco Counseling Counseling given: Not Answered   Clinical Intake:  Pre-visit preparation completed: Yes  Pain : No/denies pain Pain Score: 0-No pain     BMI - recorded: 28.32 Nutritional Status: BMI 25 -29 Overweight Nutritional Risks: None Diabetes: Yes CBG done?: No Did pt. bring in CBG monitor from home?: No  How often do you need to have someone help you when you read instructions, pamphlets, or other written materials from your doctor or pharmacy?: 1 - Never  Diabetic? Nutrition Risk Assessment:  Has the patient had any N/V/D within the last 2 months?  No  Does the patient have any non-healing wounds?  No  Has the patient had any unintentional weight loss or weight gain?  No   Diabetes:  Is the patient diabetic?  Yes  If diabetic, was a CBG obtained today?  No  Did the patient bring in their glucometer from home?  No  How often do you monitor your CBG's? Never - doesn't like pricking finger.   Financial Strains and Diabetes Management:  Are you having any financial strains with the device, your supplies or your medication? No .  Does the patient want to be seen by Chronic Care Management for management of their diabetes?  No  Would the patient like to be referred to a Nutritionist or for Diabetic Management?  No   Diabetic Exams:  Diabetic Eye Exam: Completed 09/29/2020.   Diabetic Foot Exam:  Completed 12/03/2020. Pt has been advised about the importance in completing  this exam. Pt is scheduled for diabetic foot exam on 03/01/2021.    Interpreter Needed?: No  Information entered by :: Kaniya Trueheart, LPN   Activities of Daily Living In your present state of health, do you have any difficulty performing the following activities: 02/28/2021 01/10/2021  Hearing? N N  Vision? Y N  Comment glaucoma -  Difficulty concentrating or making decisions? Y N  Comment sometimes -  Walking or climbing stairs? Y N  Comment she can do this, but usually avoids -  Dressing or bathing? N Y  Doing errands, shopping? Y Y  Comment doesn't drive - daughter lives next door and drives -  Conservation officer, nature and eating ? N -  Using the Toilet? N -  In the past six months, have you accidently leaked urine? N -  Do you have problems with loss of bowel control? N -  Managing your Medications? N -  Managing your Finances? N -  Housekeeping or managing your Housekeeping? N -  Some recent data might be hidden    Patient Care Team: Ivy Lynn, NP as PCP - General (Nurse Practitioner) Satira Sark, MD as PCP - Cardiology (Cardiology) Thompson Grayer, MD as PCP - Electrophysiology (Cardiology) Leta Baptist, MD as Consulting Physician (Otolaryngology) Lavera Guise, [facility] (Pharmacist) Brita Romp, NP as Nurse Practitioner (Endocrinology)  Indicate any recent Medical Services you may have received from other than [facility] providers in the past year (date may be approximate).     Assessment:   This is a routine wellness examination for [name].  Hearing/Vision screen Hearing Screening - Comments:: Denies hearing difficulties  Vision Screening - Comments:: Wears rx glasses for reading only prn - up to date with annual eye exams with [facility]  Dietary issues and exercise activities discussed: Current Exercise Habits: Home exercise routine, Type of exercise: walking;Other - see comments (yard work and  house work), Time (Minutes): 15, Frequency (Times/Week): 6, Weekly Exercise (Minutes/Week): 90, Intensity: Mild, Exercise limited by: cardiac condition(s);orthopedic condition(s)   Goals Addressed   None    Depression Screen PHQ 2/9 Scores 01/18/2021 12/03/2020 12/03/2020 08/27/2020 02/26/2020 01/09/2020 11/20/2019  PHQ - 2 Score 0 0 1 4 3  0 0  PHQ- 9 Score 7 10 - 18 8 - -    Fall Risk Fall Risk  01/18/2021 12/03/2020 08/27/2020 02/26/2020 01/09/2020  Falls in the past year? 0 0 0 0 0  Number falls in past yr: - - - - -  Injury with Fall? - - - - -  Risk for fall due to : - - - - -  Follow up - - - - -    [place]:  Any stairs in or around the home? No  If so, are there any without handrails? No  Home free of loose throw rugs in walkways, pet beds, electrical cords, etc? Yes  Adequate lighting in your home to reduce risk of falls? Yes   ASSISTIVE DEVICES UTILIZED TO PREVENT FALLS:  Life alert? Yes  Use of a cane, walker or w/c? No  Grab bars in the bathroom? Yes  Shower chair or bench in shower? Yes  Elevated toilet seat or a handicapped toilet? Yes   TIMED UP AND GO:  Was the test performed? No . Telephonic visit  Cognitive Function:     6CIT Screen 02/28/2021 02/26/2020  What Year? 0 points 0 points  What month? 0 points 0 points  What time? 0 points  0 points  Count back from 20 0 points 0 points  Months in reverse 2 points 0 points  Repeat phrase 2 points 4 points  Total Score 4 4    Immunizations Immunization History  Administered Date(s) Administered   Fluad Quad(high Dose 65+) 11/20/2019, 12/03/2020   Influenza Split 12/05/2005, 12/03/2006   Influenza, High Dose Seasonal PF 12/25/2016, 01/07/2018   Influenza,inj,quad, With Preservative 12/03/2015   Influenza-Unspecified 01/30/1995, 12/13/2000, 03/16/2003, 01/21/2015, 12/25/2016, 01/07/2018, 11/21/2018   Moderna Covid-19 Vaccine Bivalent Booster 72yrs & up 01/18/2021   Moderna  SARS-COV2 Booster Vaccination 12/31/2019, 07/04/2020   Moderna Sars-Covid-2 Vaccination 04/01/2019, 04/29/2019   Pneumococcal Polysaccharide-23 01/30/1995, 12/05/2005, 03/11/2020   Pneumococcal-Unspecified 12/24/2018   Tdap 12/24/2018    TDAP status: Up to date  Flu Vaccine status: Up to date  Pneumococcal vaccine status: Up to date  Covid-19 vaccine status: Completed vaccines  Qualifies for Shingles Vaccine? Yes   Zostavax completed Yes   Shingrix Completed?: No.    Education has been provided regarding the importance of this vaccine. Patient has been advised to call insurance company to determine out of pocket expense if they have not yet received this vaccine. Advised may also receive vaccine at local pharmacy or Health Dept. Verbalized acceptance and understanding.  Screening Tests Health Maintenance  Topic Date Due   OPHTHALMOLOGY EXAM  Never done   COVID-19 Vaccine (3 - Moderna risk series) 01/18/2021   Pneumonia Vaccine 76+ Years old (3 - PCV) 03/11/2021   Zoster Vaccines- Shingrix (1 of 2) 03/05/2021 (Originally 07/03/1959)   HEMOGLOBIN A1C  07/10/2021   FOOT EXAM  12/03/2021   TETANUS/TDAP  12/23/2028   INFLUENZA VACCINE  Completed   DEXA SCAN  Completed   HPV VACCINES  Aged Out    Health Maintenance  Health Maintenance Due  Topic Date Due   OPHTHALMOLOGY EXAM  Never done   COVID-19 Vaccine (3 - Moderna risk series) 01/18/2021   Pneumonia Vaccine 51+ Years old (3 - PCV) 03/11/2021    Colorectal cancer screening: No longer required.   Mammogram status: No longer required due to age.  Bone Density status: Completed 12/03/2020. Results reflect: Bone density results: OSTEOPENIA. Repeat every 2 years.  Lung Cancer Screening: (Low Dose CT Chest recommended if Age 64-80 years, 30 pack-year currently smoking OR have quit w/in 15years.) does not qualify.   Additional Screening:  Hepatitis C Screening: does not qualify  Vision Screening: Recommended annual  ophthalmology exams for early detection of glaucoma and other disorders of the eye. Is the patient up to date with their annual eye exam?  Yes  Who is the provider or what is the name of the office in which the patient attends annual eye exams? [facility] If pt is not established with a provider, would they like to be referred to a provider to establish care? No .   Dental Screening: Recommended annual dental exams for proper oral hygiene  Community Resource Referral / Chronic Care Management: CRR required this visit?  No   CCM required this visit?  No      Plan:     I have personally reviewed and noted the following in the patients chart:   Medical and social history Use of alcohol, tobacco or illicit drugs  Current medications and supplements including opioid prescriptions.  Functional ability and status Nutritional status Physical activity Advanced directives List of other physicians Hospitalizations, surgeries, and ER visits in previous 12 months Vitals Screenings to include cognitive, depression, and falls Referrals and  appointments  In addition, I have reviewed and discussed with patient certain preventive protocols, quality metrics, and best practice recommendations. A written personalized care plan for preventive services as well as general preventive health recommendations were provided to patient.     Sandrea Hammond, LPN   05/22/6832   Nurse Notes: None

## 2021-03-01 ENCOUNTER — Ambulatory Visit (HOSPITAL_BASED_OUTPATIENT_CLINIC_OR_DEPARTMENT_OTHER)
Admission: RE | Admit: 2021-03-01 | Discharge: 2021-03-01 | Disposition: A | Discharge status: Home / Self Care | Payer: Medicare Other | Source: Ambulatory Visit | Attending: Physician Assistant | Admitting: Physician Assistant

## 2021-03-01 ENCOUNTER — Other Ambulatory Visit: Payer: Self-pay

## 2021-03-01 ENCOUNTER — Ambulatory Visit: Payer: Medicare Other | Admitting: Nurse Practitioner

## 2021-03-01 ENCOUNTER — Ambulatory Visit (HOSPITAL_COMMUNITY)
Admission: RE | Admit: 2021-03-01 | Discharge: 2021-03-01 | Disposition: A | Discharge status: Home / Self Care | Payer: Medicare Other | Source: Ambulatory Visit | Attending: Physician Assistant | Admitting: Physician Assistant

## 2021-03-01 VITALS — BP 144/64 | HR 66 | Ht 64.0 in | Wt 166.8 lb

## 2021-03-01 DIAGNOSIS — Z7901 Long term (current) use of anticoagulants: Secondary | ICD-10-CM | POA: Diagnosis not present

## 2021-03-01 DIAGNOSIS — I82411 Acute embolism and thrombosis of right femoral vein: Secondary | ICD-10-CM | POA: Diagnosis not present

## 2021-03-01 DIAGNOSIS — E039 Hypothyroidism, unspecified: Secondary | ICD-10-CM | POA: Diagnosis not present

## 2021-03-01 DIAGNOSIS — I5032 Chronic diastolic (congestive) heart failure: Secondary | ICD-10-CM | POA: Insufficient documentation

## 2021-03-01 DIAGNOSIS — Z79899 Other long term (current) drug therapy: Secondary | ICD-10-CM | POA: Diagnosis not present

## 2021-03-01 DIAGNOSIS — I471 Supraventricular tachycardia: Secondary | ICD-10-CM | POA: Insufficient documentation

## 2021-03-01 DIAGNOSIS — I4819 Other persistent atrial fibrillation: Secondary | ICD-10-CM | POA: Diagnosis not present

## 2021-03-01 DIAGNOSIS — R609 Edema, unspecified: Secondary | ICD-10-CM

## 2021-03-01 DIAGNOSIS — E785 Hyperlipidemia, unspecified: Secondary | ICD-10-CM | POA: Diagnosis not present

## 2021-03-01 DIAGNOSIS — I2510 Atherosclerotic heart disease of native coronary artery without angina pectoris: Secondary | ICD-10-CM | POA: Insufficient documentation

## 2021-03-01 DIAGNOSIS — I491 Atrial premature depolarization: Secondary | ICD-10-CM | POA: Insufficient documentation

## 2021-03-01 DIAGNOSIS — M7989 Other specified soft tissue disorders: Secondary | ICD-10-CM | POA: Insufficient documentation

## 2021-03-01 DIAGNOSIS — D6869 Other thrombophilia: Secondary | ICD-10-CM

## 2021-03-01 DIAGNOSIS — E119 Type 2 diabetes mellitus without complications: Secondary | ICD-10-CM | POA: Insufficient documentation

## 2021-03-01 DIAGNOSIS — I110 Hypertensive heart disease with heart failure: Secondary | ICD-10-CM | POA: Diagnosis not present

## 2021-03-01 NOTE — Patient Instructions (Signed)
Increase lasix to 40mg  once a day for the next 3 days then return to normal dosing.

## 2021-03-01 NOTE — Progress Notes (Signed)
Right lower extremity venous duplex has been completed. Preliminary results can be found in CV Proc through chart review.  Results were given to Wilkinson PA.  03/01/21 3:57 PM [name] RVT

## 2021-03-01 NOTE — Progress Notes (Addendum)
Primary Care Physician: Ivy Lynn, NP Primary Cardiologist: Dr Domenic Polite Primary Electrophysiologist: Dr Rayann Heman Referring Physician: Dr Reinaldo Raddle [name] is a 81 y.o. female with a history of CAD, HTN, DM, HLD, and persistent atrial fibrillation who presents for follow up in the [facility]. The patient was initially diagnosed with atrial fibrillation on 11/2017 after presenting with symptoms of palpitations and chest pain to ER and found to be in afib with RVR. She was discharged with Eliquis and rate control with plan to cardiovert on f/u but she had converted on her own. She denies snoring or significant alcohol use. Patient was seen at the ER 11/11/18 with symptoms of SOB, chest tightness, and palpitations for 3 days. She was found to be in afib with RVR and underwent successful DCCV. Zio patch prior to this episode showed no afib but did show very frequent short episodes of SVT. She was started on amiodarone which controlled her afib but unfortunately she developed hypothyroidism and this was discontinued.   On follow up today, patient is s/p afib ablation with Dr Rayann Heman on 02/16/21. There were multiple atypical atrial flutter circuits noted. She has done well with no heart racing or palpitations. However, she has noted an increase in edema in her right leg, going up to her mid thigh. She reports it does feel tight when she walks. She started Lasix daily with only minimal improvement. She reports compliance with Eliquis and Plavix.   Today, she denies symptoms of palpitations, chest pain, orthopnea, PND, dizziness, presyncope, syncope, snoring, daytime somnolence, bleeding, or neurologic sequela. The patient is tolerating medications without difficulties and is otherwise without complaint today.    Atrial Fibrillation Risk Factors:  she does not have symptoms or diagnosis of sleep apnea. she does not have a history of rheumatic fever. she does not  have a history of alcohol use. The patient does not have a history of early familial atrial fibrillation or other arrhythmias.  she has a BMI of Body mass index is 28.63 kg/m.Marland Kitchen Filed Weights   03/01/21 1408  Weight: 75.7 kg     Family History  Problem Relation Age of Onset   Other Mother        "natural causes"   Heart disease Mother    Cancer Father        unsure of type     Atrial Fibrillation Management history:  Previous antiarrhythmic drugs: amiodarone  Previous cardioversions: 11/11/18 Previous ablations: none CHADS2VASC score: 6 Anticoagulation history: Eliquis   Past Medical History:  Diagnosis Date   Anxiety    Arthritis    Bulging lumbar disc    Chronic lower back pain    Coronary atherosclerosis of native coronary artery    DES LAD 02/2008, DES LAD 02/2015, DES ramus and DES x2 mid RCA 02/2020   Depression    Dyslipidemia    Essential hypertension    Facial numbness    Frequent headaches    GERD (gastroesophageal reflux disease)    Glaucoma    Heart attack ([place])    Insomnia    Paroxysmal atrial fibrillation ([place])    Diagnosed October 2019   PSVT (paroxysmal supraventricular tachycardia) ([facility])    Refusal of blood transfusions as patient is Jehovah's Witness    Thyroid disease    Type 2 diabetes mellitus ([place])    Borderline   Vitamin D deficiency    Past Surgical History:  Procedure Laterality Date   ATRIAL FIBRILLATION  ABLATION N/A 02/16/2021   Procedure: ATRIAL FIBRILLATION ABLATION;  Surgeon: Thompson Grayer, MD;  Location: [facility];  Service: Cardiovascular;  Laterality: N/A;   CARDIAC CATHETERIZATION N/A 02/23/2015   Procedure: Left Heart Cath and Coronary Angiography;  Surgeon: Sherren Mocha, MD;  Location: [facility];  Service: Cardiovascular;  Laterality: N/A;   CARDIAC CATHETERIZATION N/A 02/23/2015   Procedure: Coronary Stent Intervention;  Surgeon: Sherren Mocha, MD;  Location: [facility];  Service: Cardiovascular;   Laterality: N/A;   CARDIAC CATHETERIZATION N/A 01/04/2016   Procedure: Left Heart Cath and Coronary Angiography;  Surgeon: Peter M Martinique, MD;  Location: [facility];  Service: Cardiovascular;  Laterality: N/A;   CATARACT EXTRACTION     CORONARY ANGIOPLASTY  02/23/2015   CORONARY ANGIOPLASTY WITH STENT PLACEMENT  2009   CORONARY STENT INTERVENTION N/A 03/09/2020   Procedure: CORONARY STENT INTERVENTION;  Surgeon: Jettie Booze, MD;  Location: [facility];  Service: Cardiovascular;  Laterality: N/A;   DILATION AND CURETTAGE OF UTERUS     implantable loop recorder placement  04/01/2020   Medtronic Reveal Linq model LNQ [id number]) implantable loop recorder    LEFT HEART CATH AND CORONARY ANGIOGRAPHY N/A 03/09/2020   Procedure: LEFT HEART CATH AND CORONARY ANGIOGRAPHY;  Surgeon: Jettie Booze, MD;  Location: [facility];  Service: Cardiovascular;  Laterality: N/A;   TUBAL LIGATION      Current Outpatient Medications  Medication Sig Dispense Refill   apixaban (ELIQUIS) 5 MG TABS tablet Take 1 tablet (5 mg total) by mouth 2 (two) times daily. 180 tablet 3   Ascorbic Acid (VITAMIN C) 500 MG tablet Take 500 mg by mouth daily.       bimatoprost (LUMIGAN) 0.01 % SOLN Place 1 drop into both eyes at bedtime.     Calcium Carbonate Antacid (TUMS PO) Take 2 tablets by mouth 4 (four) times daily as needed (for acid reflux/indigestion).      Camphor-Menthol-Methyl Sal (SALONPAS) 3.02-26-08 % PTCH Apply 1 patch topically daily as needed (pain).     Cholecalciferol (VITAMIN D) 400 UNITS capsule Take 400 Units by mouth daily.       ciclopirox (PENLAC) 8 % solution Apply 1 application topically at bedtime.     clopidogrel (PLAVIX) 75 MG tablet Take 1 tablet (75 mg total) by mouth daily with breakfast. 90 tablet 3   Cyanocobalamin (VITAMIN B 12 PO) Take 1 tablet by mouth daily.     diclofenac Sodium (VOLTAREN) 1 % GEL Apply 2 g topically 4 (four) times daily. (Patient taking  differently: Apply 2 g topically 4 (four) times daily as needed (pain).) 50 g 2   diltiazem (CARDIZEM CD) 300 MG 24 hr capsule Take 1 capsule (300 mg total) by mouth daily. 90 capsule 3   furosemide (LASIX) 20 MG tablet Take 1 tablet (20 mg total) by mouth daily as needed for edema. 30 tablet    Garlic 962 MG TABS Take 1 tablet by mouth at bedtime.     HYDROcodone-acetaminophen (NORCO) 5-325 MG tablet Take 1 tablet by mouth every 6 (six) hours as needed for moderate pain. 120 tablet 0   levothyroxine (SYNTHROID) 50 MCG tablet Take 1 tablet (50 mcg total) by mouth daily before breakfast. 90 tablet 3   losartan (COZAAR) 50 MG tablet Take 1 tablet (50 mg total) by mouth daily. 90 tablet 3   MAGNESIUM CARBONATE PO Take 1 tablet by mouth daily. 400 mg  metFORMIN (GLUCOPHAGE-XR) 500 MG 24 hr tablet Take 1 tablet (500 mg total) by mouth daily with breakfast. 30 tablet 4   metoprolol succinate (TOPROL XL) 100 MG 24 hr tablet Take 1 tablet (100 mg total) by mouth daily. Take with or immediately following a meal. 30 tablet 11   nitroGLYCERIN (NITROSTAT) 0.4 MG SL tablet Place 0.4 mg under the tongue every 5 (five) minutes as needed for chest pain.     pantoprazole (PROTONIX) 40 MG tablet Take 1 tablet (40 mg total) by mouth daily. 45 tablet 0   promethazine (PHENERGAN) 25 MG tablet Take 1 tablet by mouth every 8 (eight) hours as needed for nausea or vomiting.     rosuvastatin (CRESTOR) 20 MG tablet TAKE ONE (1) TABLET BY MOUTH EVERY DAY 90 tablet 3   timolol (TIMOPTIC) 0.5 % ophthalmic solution Place 1 drop into both eyes daily.     traZODone (DESYREL) 100 MG tablet TAKE ONE TABLET BY MOUTH AT BEDTIME 90 tablet 0   TURMERIC PO Take 1 tablet by mouth daily.     VENTOLIN HFA 108 (90 Base) MCG/ACT inhaler Inhale 1-2 puffs into the lungs every 4 (four) hours as needed for shortness of breath. 18 g 1   No current facility-administered medications for this encounter.    No Known Allergies  Social History    Socioeconomic History   Marital status: Widowed    Spouse name: Not on file   Number of children: 8   Years of education: 10th grade   Highest education level: 10th grade  Occupational History   Occupation: RETIRED  Tobacco Use   Smoking status: Never   Smokeless tobacco: Never  Vaping Use   Vaping Use: Never used  Substance and Sexual Activity   Alcohol use: Yes    Alcohol/week: 1.0 standard drink    Types: 1 Glasses of wine per week    Comment: RARE OCCASION   Drug use: No   Sexual activity: Not Currently  Other Topics Concern   Not on file  Social History Narrative   Lives at home alone.   Right-handed.   No caffeine use.   Daughter lives next door and helps her out   Social Determinants of Health   Financial Resource Strain: Not on file  Food Insecurity: Not on file  Transportation Needs: Not on file  Physical Activity: Not on file  Stress: No Stress Concern Present   Feeling of Stress : Only a little  Social Connections: Not on file  Intimate Partner Violence: Not on file     ROS- All systems are reviewed and negative except as per the HPI above.  Physical Exam: Vitals:   03/01/21 1408  BP: (!) 144/64  Pulse: 66  Weight: 75.7 kg  Height: 5\' 4"  (1.626 m)    GEN- The patient is a well appearing elderly female, alert and oriented x 3 today.   HEENT-head normocephalic, atraumatic, sclera clear, conjunctiva pink, hearing intact, trachea midline. Lungs- Clear to ausculation bilaterally, normal work of breathing Heart- Regular rate and rhythm, no murmurs, rubs or gallops  GI- soft, NT, ND, + BS Extremities- no clubbing, cyanosis. 1-2+ edema of R lower extremity, 2+ pedal pulse MS- no significant deformity or atrophy Skin- no rash or lesion Psych- euthymic mood, full affect Neuro- strength and sensation are intact   Wt Readings from Last 3 Encounters:  03/01/21 75.7 kg  02/28/21 74.8 kg  02/16/21 75.3 kg    EKG today demonstrates  SR Vent.  rate  66 BPM PR interval 166 ms QRS duration 72 ms QT/QTcB 412/431 ms  Echo 12/11/19 demonstrated   1. Left ventricular ejection fraction, by estimation, is 65 to 70%. The  left ventricle has normal function. The left ventricle has no regional  wall motion abnormalities. There is mild left ventricular hypertrophy.  Left ventricular diastolic parameters are consistent with Grade II diastolic dysfunction (pseudonormalization). Elevated left atrial pressure.   2. Right ventricular systolic function is normal. The right ventricular  size is normal. There is mildly elevated pulmonary artery systolic  pressure.   3. The mitral valve is normal in structure. Mild mitral valve  regurgitation. No evidence of mitral stenosis. Moderate mitral annular  calcification.   4. The aortic valve has an indeterminant number of cusps. There is mild calcification of the aortic valve. There is mild thickening of the aortic valve. Aortic valve regurgitation is mild. No aortic stenosis is present.   5. Mild pulmonary HTN, PASP is 36 mmHg.   6. The inferior vena cava is normal in size with greater than 50%  respiratory variability, suggesting right atrial pressure of 3 mmHg.    Epic records are reviewed at length today   Assessment and Plan: 1. Persistent atrial fibrillation/SVT Recall amiodarone d/c 2/2 elevated TSH (stopped 11/11/19) Would avoid class IC with h/o CAD. S/p afib ablation with Dr Rayann Heman on 02/16/21 ILR shows no new afib episodes since 12/2020. Continue Eliquis 5 mg BID with no missed doses for 3 months post ablation.  Continue diltiazem 300 mg daily Continue Toprol 100 mg daily  This patients CHA2DS2-VASc Score and unadjusted Ischemic Stroke Rate (% per year) is equal to 9.7 % stroke rate/year from a score of 6  Above score calculated as 1 point each if present [CHF, HTN, DM, Vascular=MI/PAD/Aortic Plaque, Age if 65-74, or Female] Above score calculated as 2 points each if present [Age > 75, or  Stroke/TIA/TE]  2. CAD No anginal symptoms.  3. HTN Stable, no changes today.  4. Chronic diastolic CHF Appears euvolemic   5. DVT Patient sent for venous u/s at visit today. + DVT in common femoral vein.  D/w Dr Rayann Heman who did her ablation. Likely traumatic from ablation. She reports strict compliance with anticoagulation. Will d/w vascular. ? If we need to bridge with lovenox to warfarin vs continue on Eliquis.  ED precautions given.    Follow up in AF clinic as scheduled. Will f/u with phone call regarding anticoagulation in the morning.    Addendum 03/02/21: + for DVT in common femoral vein. Does not extend above inguinal ligament. D/w Dr Rayann Heman who also discussed with vascular colleagues. Will continue Eliquis and Plavix at present doses. Referred to Dr Donzetta Matters for close follow up. Patient notified of results and in agreement with plan.    [facility] [address] [place], [place] [address] [phone] 03/01/2021 4:05 PM

## 2021-03-02 ENCOUNTER — Other Ambulatory Visit (HOSPITAL_COMMUNITY): Payer: Self-pay | Admitting: *Deleted

## 2021-03-02 DIAGNOSIS — I82411 Acute embolism and thrombosis of right femoral vein: Secondary | ICD-10-CM

## 2021-03-02 NOTE — Addendum Note (Signed)
Encounter addended by: Oliver Barre, PA on: 03/02/2021 4:14 PM  Actions taken: Clinical Note Signed

## 2021-03-04 ENCOUNTER — Ambulatory Visit: Payer: Medicare Other | Admitting: Nurse Practitioner

## 2021-03-04 ENCOUNTER — Ambulatory Visit (INDEPENDENT_AMBULATORY_CARE_PROVIDER_SITE_OTHER): Payer: Medicare Other | Admitting: Nurse Practitioner

## 2021-03-04 ENCOUNTER — Encounter: Payer: Self-pay | Admitting: Nurse Practitioner

## 2021-03-04 VITALS — BP 123/57 | HR 64 | Temp 97.6°F | Resp 20 | Ht 64.0 in | Wt 168.0 lb

## 2021-03-04 DIAGNOSIS — E782 Mixed hyperlipidemia: Secondary | ICD-10-CM | POA: Diagnosis not present

## 2021-03-04 DIAGNOSIS — E039 Hypothyroidism, unspecified: Secondary | ICD-10-CM

## 2021-03-04 DIAGNOSIS — E119 Type 2 diabetes mellitus without complications: Secondary | ICD-10-CM | POA: Diagnosis not present

## 2021-03-04 DIAGNOSIS — I10 Essential (primary) hypertension: Secondary | ICD-10-CM

## 2021-03-04 DIAGNOSIS — Z23 Encounter for immunization: Secondary | ICD-10-CM

## 2021-03-04 NOTE — Progress Notes (Signed)
Established Patient Office Visit  Subjective:  Patient ID: [name], female    DOB: 02-09-1941  Age: 81 y.o. MRN: [id number]  CC:  Chief Complaint  Patient presents with   Medical Management of Chronic Issues    3 mo     HPI [name] presents for follow up of hypertension. Patient was diagnosed in 11/12/2009. The patient is tolerating the medication well without side effects. Compliance with treatment has been good; including taking medication as directed , maintains a healthy diet and regular exercise regimen , and following up as directed. Diltiazem 300 mg daily, Metroprolol 100 mg tablet daily.  Mixed hyperlipidemia  Pt presents with hyperlipidemia. Patient was diagnosed in 11/12/2009 . Compliance with treatment has been good; The patient is compliant with medications, maintains a low cholesterol diet , follows up as directed , and maintains an exercise regimen . The patient denies experiencing any hypercholesterolemia related symptoms.  Rosuvastatin 20 mg tablet by mouth daily.  Thyroid: Patient presents for evaluation of hypothyroidism. Current symptoms include none. Patient denies denies fatigue, weight changes, heat/cold intolerance, bowel/skin changes or CVS symptoms.    Diabetes Mellitus Type II, Follow-up: Patient here for follow-up of Type 2 diabetes mellitus.  Current symptoms/problems include none and have been stable. Symptoms have been present for 3 years.  Known diabetic complications: cardiovascular disease Cardiovascular risk factors: advanced age (older than 64 for men, 82 for women), diabetes mellitus, dyslipidemia, and hypertension Current diabetic medications include  metformin 500 mg tablet daily .   Eye exam current (within one year): yes Weight trend: stable Prior visit with dietician: no Current diet: diabetic Current exercise: none  Current monitoring regimen: home blood tests - daily Home blood sugar records: fasting range: patient did not  bring a log but says blood glucose is within her normal range., Any episodes of hypoglycemia? no  Is She on ACE inhibitor or angiotensin II receptor blocker?  Yes   losartan (Cozaar)   Past Medical History:  Diagnosis Date   Anxiety    Arthritis    Bulging lumbar disc    Chronic lower back pain    Coronary atherosclerosis of native coronary artery    DES LAD 02/2008, DES LAD 02/2015, DES ramus and DES x2 mid RCA 02/2020   Depression    Dyslipidemia    Essential hypertension    Facial numbness    Frequent headaches    GERD (gastroesophageal reflux disease)    Glaucoma    Heart attack ([place])    Insomnia    Paroxysmal atrial fibrillation ([place])    Diagnosed October 2019   PSVT (paroxysmal supraventricular tachycardia) ([place])    Refusal of blood transfusions as patient is Jehovah's Witness    Thyroid disease    Type 2 diabetes mellitus ([place])    Borderline   Vitamin D deficiency     Past Surgical History:  Procedure Laterality Date   ATRIAL FIBRILLATION ABLATION N/A 02/16/2021   Procedure: ATRIAL FIBRILLATION ABLATION;  Surgeon: Thompson Grayer, MD;  Location: [facility];  Service: Cardiovascular;  Laterality: N/A;   CARDIAC CATHETERIZATION N/A 02/23/2015   Procedure: Left Heart Cath and Coronary Angiography;  Surgeon: Sherren Mocha, MD;  Location: [facility];  Service: Cardiovascular;  Laterality: N/A;   CARDIAC CATHETERIZATION N/A 02/23/2015   Procedure: Coronary Stent Intervention;  Surgeon: Sherren Mocha, MD;  Location: [facility];  Service: Cardiovascular;  Laterality: N/A;   CARDIAC CATHETERIZATION N/A 01/04/2016  Procedure: Left Heart Cath and Coronary Angiography;  Surgeon: Peter M Martinique, MD;  Location: [facility];  Service: Cardiovascular;  Laterality: N/A;   CATARACT EXTRACTION     CORONARY ANGIOPLASTY  02/23/2015   CORONARY ANGIOPLASTY WITH STENT PLACEMENT  2009   CORONARY STENT INTERVENTION N/A 03/09/2020   Procedure: CORONARY STENT  INTERVENTION;  Surgeon: Jettie Booze, MD;  Location: [facility];  Service: Cardiovascular;  Laterality: N/A;   DILATION AND CURETTAGE OF UTERUS     implantable loop recorder placement  04/01/2020   Medtronic Reveal Linq model LNQ [id number]) implantable loop recorder    LEFT HEART CATH AND CORONARY ANGIOGRAPHY N/A 03/09/2020   Procedure: LEFT HEART CATH AND CORONARY ANGIOGRAPHY;  Surgeon: Jettie Booze, MD;  Location: [facility];  Service: Cardiovascular;  Laterality: N/A;   TUBAL LIGATION      Family History  Problem Relation Age of Onset   Other Mother        "natural causes"   Heart disease Mother    Cancer Father        unsure of type    Social History   Socioeconomic History   Marital status: Widowed    Spouse name: Not on file   Number of children: 8   Years of education: 10th grade   Highest education level: 10th grade  Occupational History   Occupation: RETIRED  Tobacco Use   Smoking status: Never   Smokeless tobacco: Never  Vaping Use   Vaping Use: Never used  Substance and Sexual Activity   Alcohol use: Yes    Alcohol/week: 1.0 standard drink    Types: 1 Glasses of wine per week    Comment: RARE OCCASION   Drug use: No   Sexual activity: Not Currently  Other Topics Concern   Not on file  Social History Narrative   Lives at home alone.   Right-handed.   No caffeine use.   Daughter lives next door and helps her out   Social Determinants of Health   Financial Resource Strain: Not on file  Food Insecurity: Not on file  Transportation Needs: Not on file  Physical Activity: Not on file  Stress: No Stress Concern Present   Feeling of Stress : Only a little  Social Connections: Not on file  Intimate Partner Violence: Not on file    Outpatient Medications Prior to Visit  Medication Sig Dispense Refill   apixaban (ELIQUIS) 5 MG TABS tablet Take 1 tablet (5 mg total) by mouth 2 (two) times daily. 180 tablet 3   Ascorbic  Acid (VITAMIN C) 500 MG tablet Take 500 mg by mouth daily.       bimatoprost (LUMIGAN) 0.01 % SOLN Place 1 drop into both eyes at bedtime.     Calcium Carbonate Antacid (TUMS PO) Take 2 tablets by mouth 4 (four) times daily as needed (for acid reflux/indigestion).      Camphor-Menthol-Methyl Sal (SALONPAS) 3.02-26-08 % PTCH Apply 1 patch topically daily as needed (pain).     Cholecalciferol (VITAMIN D) 400 UNITS capsule Take 400 Units by mouth daily.       ciclopirox (PENLAC) 8 % solution Apply 1 application topically at bedtime.     clopidogrel (PLAVIX) 75 MG tablet Take 1 tablet (75 mg total) by mouth daily with breakfast. 90 tablet 3   Cyanocobalamin (VITAMIN B 12 PO) Take 1 tablet by mouth daily.     diclofenac Sodium (VOLTAREN) 1 % GEL Apply  2 g topically 4 (four) times daily. (Patient taking differently: Apply 2 g topically 4 (four) times daily as needed (pain).) 50 g 2   diltiazem (CARDIZEM CD) 300 MG 24 hr capsule Take 1 capsule (300 mg total) by mouth daily. 90 capsule 3   furosemide (LASIX) 20 MG tablet Take 1 tablet (20 mg total) by mouth daily as needed for edema. 30 tablet    Garlic 078 MG TABS Take 1 tablet by mouth at bedtime.     HYDROcodone-acetaminophen (NORCO) 5-325 MG tablet Take 1 tablet by mouth every 6 (six) hours as needed for moderate pain. 120 tablet 0   levothyroxine (SYNTHROID) 50 MCG tablet Take 1 tablet (50 mcg total) by mouth daily before breakfast. 90 tablet 3   losartan (COZAAR) 50 MG tablet Take 1 tablet (50 mg total) by mouth daily. 90 tablet 3   MAGNESIUM CARBONATE PO Take 1 tablet by mouth daily. 400 mg     metFORMIN (GLUCOPHAGE-XR) 500 MG 24 hr tablet Take 1 tablet (500 mg total) by mouth daily with breakfast. 30 tablet 4   metoprolol succinate (TOPROL XL) 100 MG 24 hr tablet Take 1 tablet (100 mg total) by mouth daily. Take with or immediately following a meal. 30 tablet 11   nitroGLYCERIN (NITROSTAT) 0.4 MG SL tablet Place 0.4 mg under the tongue every 5 (five)  minutes as needed for chest pain.     pantoprazole (PROTONIX) 40 MG tablet Take 1 tablet (40 mg total) by mouth daily. 45 tablet 0   rosuvastatin (CRESTOR) 20 MG tablet TAKE ONE (1) TABLET BY MOUTH EVERY DAY 90 tablet 3   timolol (TIMOPTIC) 0.5 % ophthalmic solution Place 1 drop into both eyes daily.     traZODone (DESYREL) 100 MG tablet TAKE ONE TABLET BY MOUTH AT BEDTIME 90 tablet 0   TURMERIC PO Take 1 tablet by mouth daily.     VENTOLIN HFA 108 (90 Base) MCG/ACT inhaler Inhale 1-2 puffs into the lungs every 4 (four) hours as needed for shortness of breath. 18 g 1   promethazine (PHENERGAN) 25 MG tablet Take 1 tablet by mouth every 8 (eight) hours as needed for nausea or vomiting. (Patient not taking: Reported on 03/04/2021)     No facility-administered medications prior to visit.    No Known Allergies  ROS Review of Systems  Constitutional: Negative.   HENT: Negative.    Eyes: Negative.   Respiratory: Negative.    Cardiovascular:  Negative for palpitations and leg swelling.  Gastrointestinal: Negative.   Psychiatric/Behavioral: Negative.    All other systems reviewed and are negative.    Objective:    Physical Exam Vitals and nursing note reviewed.  Constitutional:      Appearance: Normal appearance.  HENT:     Right Ear: External ear normal.     Nose: Nose normal.     Mouth/Throat:     Pharynx: Oropharynx is clear.  Eyes:     Conjunctiva/sclera: Conjunctivae normal.  Cardiovascular:     Rate and Rhythm: Normal rate and regular rhythm.     Pulses: Normal pulses.     Heart sounds: Normal heart sounds.  Pulmonary:     Effort: Pulmonary effort is normal.     Breath sounds: Normal breath sounds.  Abdominal:     General: Bowel sounds are normal.  Skin:    General: Skin is warm.     Findings: No rash.  Neurological:     Mental Status: She is alert and oriented  to person, place, and time.  Psychiatric:        Mood and Affect: Mood normal.        Behavior: Behavior  normal.    BP (!) 123/57    Pulse 64    Temp 97.6 F (36.4 C)    Resp 20    Ht 5' 4" (1.626 m)    Wt 168 lb (76.2 kg)    LMP  (LMP Unknown)    SpO2 99%    BMI 28.84 kg/m  Wt Readings from Last 3 Encounters:  03/04/21 168 lb (76.2 kg)  03/01/21 166 lb 12.8 oz (75.7 kg)  02/28/21 165 lb (74.8 kg)     Health Maintenance Due  Topic Date Due   OPHTHALMOLOGY EXAM  Never done   Pneumonia Vaccine 61+ Years old (3 - PCV) 03/11/2021    There are no preventive care reminders to display for this patient.  Lab Results  Component Value Date   TSH 0.232 (L) 02/07/2021   Lab Results  Component Value Date   WBC 6.3 02/03/2021   HGB 12.5 02/03/2021   HCT 37.2 02/03/2021   MCV 88 02/03/2021   PLT 213 02/03/2021   Lab Results  Component Value Date   NA 139 02/03/2021   K 4.0 02/03/2021   CO2 31 (H) 02/03/2021   GLUCOSE 125 (H) 02/03/2021   BUN 13 02/03/2021   CREATININE 0.71 02/03/2021   BILITOT 0.3 01/09/2021   ALKPHOS 90 01/09/2021   AST 29 01/09/2021   ALT 74 (H) 01/09/2021   PROT 6.6 01/09/2021   ALBUMIN 4.0 01/09/2021   CALCIUM 9.9 02/03/2021   ANIONGAP 6 01/12/2021   EGFR 86 02/03/2021   Lab Results  Component Value Date   CHOL 109 08/27/2020   Lab Results  Component Value Date   HDL 46 08/27/2020   Lab Results  Component Value Date   LDLCALC 37 08/27/2020   Lab Results  Component Value Date   TRIG 155 (H) 08/27/2020   Lab Results  Component Value Date   CHOLHDL 2.4 08/27/2020   Lab Results  Component Value Date   HGBA1C 6.6 (H) 01/10/2021      Assessment & Plan:   Problem List Items Addressed This Visit       Cardiovascular and Mediastinum   Essential hypertension - Primary    Blood pressure well controlled on current medication no changes necessary.  Follow-up in 3 months.  Future labs-CBC, CMP, lipid panel.      Relevant Orders   CBC with Differential   Comprehensive metabolic panel   Lipid Panel     Endocrine   Diabetes mellitus  without complication ([place])   Hypothyroidism    No new or worsening signs and symptoms of hypothyroidism.  No changes to current medication.  Future lab; TSH  Follow-up in 6 months        Other   Mixed hyperlipidemia    Continue low-cholesterol diet and exercise as tolerated.  Symptoms well controlled on current medication dose.  Follow-up in 3 months.         Follow-up: Return in about 4 months (around 07/02/2021).    Ivy Lynn, NP

## 2021-03-04 NOTE — Patient Instructions (Addendum)
High Cholesterol °High cholesterol is a condition in which the blood has high levels of a white, waxy substance similar to fat (cholesterol). The liver makes all the cholesterol that the body needs. The human body needs small amounts of cholesterol to help build cells. A person gets extra or excess cholesterol from the food that he or she eats. °The blood carries cholesterol from the liver to the rest of the body. If you have high cholesterol, deposits (plaques) may build up on the walls of your arteries. Arteries are the blood vessels that carry blood away from your heart. These plaques make the arteries narrow and stiff. °Cholesterol plaques increase your risk for heart attack and stroke. Work with your health care provider to keep your cholesterol levels in a healthy range. °What increases the risk? °The following factors may make you more likely to develop this condition: °Eating foods that are high in animal fat (saturated fat) or cholesterol. °Being overweight. °Not getting enough exercise. °A family history of high cholesterol (familial hypercholesterolemia). °Use of tobacco products. °Having diabetes. °What are the signs or symptoms? °In most cases, high cholesterol does not usually cause any symptoms. °In severe cases, very high cholesterol levels can cause: °Fatty bumps under the skin (xanthomas). °A white or gray ring around the black center (pupil) of the eye. °How is this diagnosed? °This condition may be diagnosed based on the results of a blood test. °If you are older than 81 years of age, your health care provider may check your cholesterol levels every 4-6 years. °You may be checked more often if you have high cholesterol or other risk factors for heart disease. °The blood test for cholesterol measures: °"Bad" cholesterol, or LDL cholesterol. This is the main type of cholesterol that causes heart disease. The desired level is less than 100 mg/dL (2.59 mmol/L). °"Good" cholesterol, or HDL  cholesterol. HDL helps protect against heart disease by cleaning the arteries and carrying the LDL to the liver for processing. The desired level for HDL is 60 mg/dL (1.55 mmol/L) or higher. °Triglycerides. These are fats that your body can store or burn for energy. The desired level is less than 150 mg/dL (1.69 mmol/L). °Total cholesterol. This measures the total amount of cholesterol in your blood and includes LDL, HDL, and triglycerides. The desired level is less than 200 mg/dL (5.17 mmol/L). °How is this treated? °Treatment for high cholesterol starts with lifestyle changes, such as diet and exercise. °Diet changes. You may be asked to eat foods that have more fiber and less saturated fats or added sugar. °Lifestyle changes. These may include regular exercise, maintaining a healthy weight, and quitting use of tobacco products. °Medicines. These are given when diet and lifestyle changes have not worked. You may be prescribed a statin medicine to help lower your cholesterol levels. °Follow these instructions at home: °Eating and drinking ° °Eat a healthy, balanced diet. This diet includes: °Daily servings of a variety of fresh, frozen, or canned fruits and vegetables. °Daily servings of whole grain foods that are rich in fiber. °Foods that are low in saturated fats and trans fats. These include poultry and fish without skin, lean cuts of meat, and low-fat dairy products. °A variety of fish, especially oily fish that contain omega-3 fatty acids. Aim to eat fish at least 2 times a week. °Avoid foods and drinks that have added sugar. °Use healthy cooking methods, such as roasting, grilling, broiling, baking, poaching, steaming, and stir-frying. Do not fry your food except for stir-frying. °  If you drink alcohol: Limit how much you have to: 0-1 drink a day for women who are not pregnant. 0-2 drinks a day for men. Know how much alcohol is in a drink. In the [place] one drink equals one 12 oz bottle of beer (355 mL),  one 5 oz glass of wine (148 mL), or one 1 oz glass of hard liquor (44 mL). Lifestyle  Get regular exercise. Aim to exercise for a total of 150 minutes a week. Increase your activity level by doing activities such as gardening, walking, and taking the stairs. Do not use any products that contain nicotine or tobacco. These products include cigarettes, chewing tobacco, and vaping devices, such as e-cigarettes. If you need help quitting, ask your health care provider. General instructions Take over-the-counter and prescription medicines only as told by your health care provider. Keep all follow-up visits. This is important. Where to find more information American Heart Association: www.heart.org National Heart, Lung, and Blood Institute: https://wilson-eaton.com/ Contact a health care provider if: You have trouble achieving or maintaining a healthy diet or weight. You are starting an exercise program. You are unable to stop smoking. Get help right away if: You have chest pain. You have trouble breathing. You have discomfort or pain in your jaw, neck, back, shoulder, or arm. You have any symptoms of a stroke. "BE FAST" is an easy way to remember the main warning signs of a stroke: B - Balance. Signs are dizziness, sudden trouble walking, or loss of balance. E - Eyes. Signs are trouble seeing or a sudden change in vision. F - Face. Signs are sudden weakness or numbness of the face, or the face or eyelid drooping on one side. A - Arms. Signs are weakness or numbness in an arm. This happens suddenly and usually on one side of the body. S - Speech. Signs are sudden trouble speaking, slurred speech, or trouble understanding what people say. T - Time. Time to call emergency services. Write down what time symptoms started. You have other signs of a stroke, such as: A sudden, severe headache with no known cause. Nausea or vomiting. Seizure. These symptoms may represent a serious problem that is an  emergency. Do not wait to see if the symptoms will go away. Get medical help right away. Call your local emergency services (911 in the [place] Do not drive yourself to the hospital. Summary Cholesterol plaques increase your risk for heart attack and stroke. Work with your health care provider to keep your cholesterol levels in a healthy range. Eat a healthy, balanced diet, get regular exercise, and maintain a healthy weight. Do not use any products that contain nicotine or tobacco. These products include cigarettes, chewing tobacco, and vaping devices, such as e-cigarettes. Get help right away if you have any symptoms of a stroke. This information is not intended to replace advice given to you by your health care provider. Make sure you discuss any questions you have with your health care provider. Document Revised: 04/22/2020 Document Reviewed: 04/12/2020 Elsevier Patient Education  [place]. Diabetes Mellitus Basics Diabetes mellitus, or diabetes, is a long-term (chronic) disease. It occurs when the body does not properly use sugar (glucose) that is released from food after you eat. Diabetes mellitus may be caused by one or both of these problems: Your pancreas does not make enough of a hormone called insulin. Your body does not react in a normal way to the insulin that it makes. Insulin lets glucose enter cells in your  body. This gives you energy. If you have diabetes, glucose cannot get into cells. This causes high blood glucose (hyperglycemia). How to treat and manage diabetes You may need to take insulin or other diabetes medicines daily to keep your glucose in balance. If you are prescribed insulin, you will learn how to give yourself insulin by injection. You may need to adjust the amount of insulin you take based on the foods that you eat. You will need to check your blood glucose levels using a glucose monitor as told by your health care provider. The readings can help determine  if you have low or high blood glucose. Generally, you should have these blood glucose levels: Before meals (preprandial): 80-130 mg/dL (4.4-7.2 mmol/L). After meals (postprandial): below 180 mg/dL (10 mmol/L). Hemoglobin A1c (HbA1c) level: less than 7%. Your health care provider will set treatment goals for you. Keep all follow-up visits. This is important. Follow these instructions at home: Diabetes medicines Take your diabetes medicines every day as told by your health care provider. List your diabetes medicines here: Name of medicine: ______________________________ Amount (dose): _______________ Time (a.m./p.m.): _______________ Notes: ___________________________________ Name of medicine: ______________________________ Amount (dose): _______________ Time (a.m./p.m.): _______________ Notes: ___________________________________ Name of medicine: ______________________________ Amount (dose): _______________ Time (a.m./p.m.): _______________ Notes: ___________________________________ Insulin If you use insulin, list the types of insulin you use here: Insulin type: ______________________________ Amount (dose): _______________ Time (a.m./p.m.): _______________Notes: ___________________________________ Insulin type: ______________________________ Amount (dose): _______________ Time (a.m./p.m.): _______________ Notes: ___________________________________ Insulin type: ______________________________ Amount (dose): _______________ Time (a.m./p.m.): _______________ Notes: ___________________________________ Insulin type: ______________________________ Amount (dose): _______________ Time (a.m./p.m.): _______________ Notes: ___________________________________ Insulin type: ______________________________ Amount (dose): _______________ Time (a.m./p.m.): _______________ Notes: ___________________________________ Managing blood glucose Check your blood glucose levels using a glucose monitor as told by  your health care provider. Write down the times that you check your glucose levels here: Time: _______________ Notes: ___________________________________ Time: _______________ Notes: ___________________________________ Time: _______________ Notes: ___________________________________ Time: _______________ Notes: ___________________________________ Time: _______________ Notes: ___________________________________ Time: _______________ Notes: ___________________________________  Low blood glucose Low blood glucose (hypoglycemia) is when glucose is at or below 70 mg/dL (3.9 mmol/L). Symptoms may include: Feeling: Hungry. Sweaty and clammy. Irritable or easily upset. Dizzy. Sleepy. Having: A fast heartbeat. A headache. A change in your vision. Numbness around the mouth, lips, or tongue. Having trouble with: Moving (coordination). Sleeping. Treating low blood glucose To treat low blood glucose, eat or drink something containing sugar right away. If you can think clearly and swallow safely, follow the 15:15 rule: Take 15 grams of a fast-acting carb (carbohydrate), as told by your health care provider. Some fast-acting carbs are: Glucose tablets: take 3-4 tablets. Hard candy: eat 3-5 pieces. Fruit juice: drink 4 oz (120 mL). Regular (not diet) soda: drink 4-6 oz (120-180 mL). Honey or sugar: eat 1 Tbsp (15 mL). Check your blood glucose levels 15 minutes after you take the carb. If your glucose is still at or below 70 mg/dL (3.9 mmol/L), take 15 grams of a carb again. If your glucose does not go above 70 mg/dL (3.9 mmol/L) after 3 tries, get help right away. After your glucose goes back to normal, eat a meal or a snack within 1 hour. Treating very low blood glucose If your glucose is at or below 54 mg/dL (3 mmol/L), you have very low blood glucose (severe hypoglycemia). This is an emergency. Do not wait to see if the symptoms will go away. Get medical help right away. Call your local  emergency services (911 in the [place] Do not  drive yourself to the hospital. Questions to ask your health care provider Should I talk with a diabetes educator? What equipment will I need to care for myself at home? What diabetes medicines do I need? When should I take them? How often do I need to check my blood glucose levels? What number can I call if I have questions? When is my follow-up visit? Where can I find a support group for people with diabetes? Where to find more information American Diabetes Association: www.diabetes.org Association of Diabetes Care and Education Specialists: www.diabeteseducator.org Contact a health care provider if: Your blood glucose is at or above 240 mg/dL (13.3 mmol/L) for 2 days in a row. You have been sick or have had a fever for 2 days or more, and you are not getting better. You have any of these problems for more than 6 hours: You cannot eat or drink. You feel nauseous. You vomit. You have diarrhea. Get help right away if: Your blood glucose is lower than 54 mg/dL (3 mmol/L). You get confused. You have trouble thinking clearly. You have trouble breathing. These symptoms may represent a serious problem that is an emergency. Do not wait to see if the symptoms will go away. Get medical help right away. Call your local emergency services (911 in the [place] Do not drive yourself to the hospital. Summary Diabetes mellitus is a chronic disease that occurs when the body does not properly use sugar (glucose) that is released from food after you eat. Take insulin and diabetes medicines as told. Check your blood glucose every day, as often as told. Keep all follow-up visits. This is important. This information is not intended to replace advice given to you by your health care provider. Make sure you discuss any questions you have with your health care provider. Document Revised: 06/10/2019 Document Reviewed: 06/10/2019 Elsevier Patient Education  2022  [place]. Hypertension, Adult Hypertension is another name for high blood pressure. High blood pressure forces your heart to work harder to pump blood. This can cause problems over time. There are two numbers in a blood pressure reading. There is a top number (systolic) over a bottom number (diastolic). It is best to have a blood pressure that is below 120/80. Healthy choices can help lower your blood pressure, or you may need medicine to help lower it. What are the causes? The cause of this condition is not known. Some conditions may be related to high blood pressure. What increases the risk? Smoking. Having type 2 diabetes mellitus, high cholesterol, or both. Not getting enough exercise or physical activity. Being overweight. Having too much fat, sugar, calories, or salt (sodium) in your diet. Drinking too much alcohol. Having long-term (chronic) kidney disease. Having a family history of high blood pressure. Age. Risk increases with age. Race. You may be at higher risk if you are African American. Gender. Men are at higher risk than women before age 16. After age 57, women are at higher risk than men. Having obstructive sleep apnea. Stress. What are the signs or symptoms? High blood pressure may not cause symptoms. Very high blood pressure (hypertensive crisis) may cause: Headache. Feelings of worry or nervousness (anxiety). Shortness of breath. Nosebleed. A feeling of being sick to your stomach (nausea). Throwing up (vomiting). Changes in how you see. Very bad chest pain. Seizures. How is this treated? This condition is treated by making healthy lifestyle changes, such as: Eating healthy foods. Exercising more. Drinking less alcohol. Your health care provider may prescribe  medicine if lifestyle changes are not enough to get your blood pressure under control, and if: Your top number is above 130. Your bottom number is above 80. Your personal target blood pressure may  vary. Follow these instructions at home: Eating and drinking  If told, follow the DASH eating plan. To follow this plan: Fill one half of your plate at each meal with fruits and vegetables. Fill one fourth of your plate at each meal with whole grains. Whole grains include whole-wheat pasta, brown rice, and whole-grain bread. Eat or drink low-fat dairy products, such as skim milk or low-fat yogurt. Fill one fourth of your plate at each meal with low-fat (lean) proteins. Low-fat proteins include fish, chicken without skin, eggs, beans, and tofu. Avoid fatty meat, cured and processed meat, or chicken with skin. Avoid pre-made or processed food. Eat less than 1,500 mg of salt each day. Do not drink alcohol if: Your doctor tells you not to drink. You are pregnant, may be pregnant, or are planning to become pregnant. If you drink alcohol: Limit how much you use to: 0-1 drink a day for women. 0-2 drinks a day for men. Be aware of how much alcohol is in your drink. In the [place] one drink equals one 12 oz bottle of beer (355 mL), one 5 oz glass of wine (148 mL), or one 1 oz glass of hard liquor (44 mL). Lifestyle  Work with your doctor to stay at a healthy weight or to lose weight. Ask your doctor what the best weight is for you. Get at least 30 minutes of exercise most days of the week. This may include walking, swimming, or biking. Get at least 30 minutes of exercise that strengthens your muscles (resistance exercise) at least 3 days a week. This may include lifting weights or doing Pilates. Do not use any products that contain nicotine or tobacco, such as cigarettes, e-cigarettes, and chewing tobacco. If you need help quitting, ask your doctor. Check your blood pressure at home as told by your doctor. Keep all follow-up visits as told by your doctor. This is important. Medicines Take over-the-counter and prescription medicines only as told by your doctor. Follow directions carefully. Do not  skip doses of blood pressure medicine. The medicine does not work as well if you skip doses. Skipping doses also puts you at risk for problems. Ask your doctor about side effects or reactions to medicines that you should watch for. Contact a doctor if you: Think you are having a reaction to the medicine you are taking. Have headaches that keep coming back (recurring). Feel dizzy. Have swelling in your ankles. Have trouble with your vision. Get help right away if you: Get a very bad headache. Start to feel mixed up (confused). Feel weak or numb. Feel faint. Have very bad pain in your: Chest. Belly (abdomen). Throw up more than once. Have trouble breathing. Summary Hypertension is another name for high blood pressure. High blood pressure forces your heart to work harder to pump blood. For most people, a normal blood pressure is less than 120/80. Making healthy choices can help lower blood pressure. If your blood pressure does not get lower with healthy choices, you may need to take medicine. This information is not intended to replace advice given to you by your health care provider. Make sure you discuss any questions you have with your health care provider. Document Revised: 10/17/2017 Document Reviewed: 10/17/2017 Elsevier Patient Education  [place].

## 2021-03-06 NOTE — Assessment & Plan Note (Signed)
No new or worsening signs and symptoms of hypothyroidism.  No changes to current medication.  Future lab; TSH  Follow-up in 6 months

## 2021-03-06 NOTE — Assessment & Plan Note (Signed)
Blood pressure well controlled on current medication no changes necessary.  Follow-up in 3 months.  Future labs-CBC, CMP, lipid panel.

## 2021-03-06 NOTE — Assessment & Plan Note (Signed)
Continue low-cholesterol diet and exercise as tolerated.  Symptoms well controlled on current medication dose.  Follow-up in 3 months.

## 2021-03-07 ENCOUNTER — Ambulatory Visit (INDEPENDENT_AMBULATORY_CARE_PROVIDER_SITE_OTHER): Payer: Medicare Other

## 2021-03-07 ENCOUNTER — Other Ambulatory Visit: Payer: Self-pay | Admitting: Nurse Practitioner

## 2021-03-07 ENCOUNTER — Telehealth: Payer: Self-pay | Admitting: Nurse Practitioner

## 2021-03-07 DIAGNOSIS — I4819 Other persistent atrial fibrillation: Secondary | ICD-10-CM | POA: Diagnosis not present

## 2021-03-07 MED ORDER — HYDROCODONE-ACETAMINOPHEN 5-325 MG PO TABS: 1.0000 | ORAL_TABLET | Freq: Four times a day (QID) | ORAL | 0 refills | Status: DC | PRN

## 2021-03-07 NOTE — Telephone Encounter (Signed)
Ivy Lynn, NP  You 4 minutes ago (3:44 PM)   I sent it. thanks   Patient aware that rx was sent to pharmacy.

## 2021-03-07 NOTE — Telephone Encounter (Signed)
°  Prescription Request  03/07/2021  Is this a "Controlled Substance" medicine? yes  Have you seen your PCP in the last 2 weeks? yes  If YES, route message to pool  -  If NO, patient needs to be scheduled for appointment.  What is the name of the medication or equipment? Hydrocodone  Have you contacted your pharmacy to request a refill? yes   Which pharmacy would you like this sent to? [facility]   Patient notified that their request is being sent to the clinical staff for review and that they should receive a response within 2 business days.    Je's pt.  She seen Je last week on Friday & this med was not called in.  Please call pt.

## 2021-03-07 NOTE — Telephone Encounter (Signed)
Patient seen on 03/04/21 and is requesting a refill on her hydrocodone

## 2021-03-08 ENCOUNTER — Other Ambulatory Visit: Payer: Self-pay

## 2021-03-08 DIAGNOSIS — I82401 Acute embolism and thrombosis of unspecified deep veins of right lower extremity: Secondary | ICD-10-CM

## 2021-03-09 LAB — CUP PACEART REMOTE DEVICE CHECK
Date Time Interrogation Session: 20230118054716
Implantable Pulse Generator Implant Date: 20220210

## 2021-03-15 ENCOUNTER — Telehealth: Payer: Self-pay | Admitting: Nurse Practitioner

## 2021-03-15 ENCOUNTER — Other Ambulatory Visit: Payer: Self-pay | Admitting: *Deleted

## 2021-03-15 MED ORDER — APIXABAN 5 MG PO TABS: 5.0000 mg | ORAL_TABLET | Freq: Two times a day (BID) | ORAL | 3 refills | Status: DC

## 2021-03-15 NOTE — Telephone Encounter (Signed)
Pt's daughter states that pt is c/o her tongue "feeling raw", her taste is off and it causes her to not eat anything at all since everything tastes bitter to her. Pt's daughter states pt has seen neurology about it and has had scans but she wants to know if there is anything else that can be done. Please advise.

## 2021-03-16 ENCOUNTER — Ambulatory Visit: Payer: Medicare Other | Admitting: Vascular Surgery

## 2021-03-16 ENCOUNTER — Ambulatory Visit (HOSPITAL_COMMUNITY)
Admission: RE | Admit: 2021-03-16 | Discharge: 2021-03-16 | Disposition: A | Discharge status: Home / Self Care | Payer: Medicare Other | Source: Ambulatory Visit | Attending: Physician Assistant | Admitting: Physician Assistant

## 2021-03-16 ENCOUNTER — Ambulatory Visit (INDEPENDENT_AMBULATORY_CARE_PROVIDER_SITE_OTHER)
Admission: RE | Admit: 2021-03-16 | Discharge: 2021-03-16 | Disposition: A | Discharge status: Home / Self Care | Payer: Medicare Other | Source: Ambulatory Visit | Attending: Vascular Surgery | Admitting: Vascular Surgery

## 2021-03-16 ENCOUNTER — Encounter (HOSPITAL_COMMUNITY): Payer: Self-pay | Admitting: Physician Assistant

## 2021-03-16 ENCOUNTER — Encounter: Payer: Self-pay | Admitting: Vascular Surgery

## 2021-03-16 ENCOUNTER — Other Ambulatory Visit: Payer: Self-pay

## 2021-03-16 VITALS — BP 149/75 | HR 67 | Temp 97.7°F | Resp 20 | Ht 64.0 in | Wt 165.0 lb

## 2021-03-16 VITALS — BP 132/66 | HR 70 | Ht 64.0 in | Wt 165.4 lb

## 2021-03-16 DIAGNOSIS — I82401 Acute embolism and thrombosis of unspecified deep veins of right lower extremity: Secondary | ICD-10-CM

## 2021-03-16 DIAGNOSIS — I2510 Atherosclerotic heart disease of native coronary artery without angina pectoris: Secondary | ICD-10-CM | POA: Diagnosis not present

## 2021-03-16 DIAGNOSIS — Z7901 Long term (current) use of anticoagulants: Secondary | ICD-10-CM | POA: Insufficient documentation

## 2021-03-16 DIAGNOSIS — D6869 Other thrombophilia: Secondary | ICD-10-CM

## 2021-03-16 DIAGNOSIS — R609 Edema, unspecified: Secondary | ICD-10-CM

## 2021-03-16 DIAGNOSIS — I82411 Acute embolism and thrombosis of right femoral vein: Secondary | ICD-10-CM | POA: Diagnosis not present

## 2021-03-16 DIAGNOSIS — Z79899 Other long term (current) drug therapy: Secondary | ICD-10-CM | POA: Insufficient documentation

## 2021-03-16 DIAGNOSIS — I5032 Chronic diastolic (congestive) heart failure: Secondary | ICD-10-CM | POA: Insufficient documentation

## 2021-03-16 DIAGNOSIS — I4819 Other persistent atrial fibrillation: Secondary | ICD-10-CM | POA: Diagnosis not present

## 2021-03-16 DIAGNOSIS — I471 Supraventricular tachycardia: Secondary | ICD-10-CM | POA: Diagnosis not present

## 2021-03-16 DIAGNOSIS — E119 Type 2 diabetes mellitus without complications: Secondary | ICD-10-CM | POA: Diagnosis not present

## 2021-03-16 DIAGNOSIS — I110 Hypertensive heart disease with heart failure: Secondary | ICD-10-CM | POA: Insufficient documentation

## 2021-03-16 NOTE — Progress Notes (Signed)
Patient ID: [name], female   DOB: 1940/04/18, 81 y.o.   MRN: [id number]  Reason for Consult: No chief complaint on file.   Referred by Ivy Lynn, NP  Subjective:     HPI:  [name] is a 81 y.o. female recent underwent atrial fibrillation ablation at the end of last year.  She is subsequently found to have right lower extremity swelling few days after the procedure and underwent DVT study which demonstrated common femoral DVT.  She now presents in our office for follow-up with this.  She is on Eliquis and was prior to the procedure.  She has never had a DVT in the past.  She states that the swelling is subsequently improved she is able to walk with minimal limitation.  She has no skin changes.  Past Medical History:  Diagnosis Date   Anxiety    Arthritis    Bulging lumbar disc    Chronic lower back pain    Coronary atherosclerosis of native coronary artery    DES LAD 02/2008, DES LAD 02/2015, DES ramus and DES x2 mid RCA 02/2020   Depression    Dyslipidemia    Essential hypertension    Facial numbness    Frequent headaches    GERD (gastroesophageal reflux disease)    Glaucoma    Heart attack ([place])    Insomnia    Paroxysmal atrial fibrillation ([place])    Diagnosed October 2019   PSVT (paroxysmal supraventricular tachycardia) (HCC)    Refusal of blood transfusions as patient is Jehovah's Witness    Thyroid disease    Type 2 diabetes mellitus ([place])    Borderline   Vitamin D deficiency    Family History  Problem Relation Age of Onset   Other Mother        "natural causes"   Heart disease Mother    Cancer Father        unsure of type   Past Surgical History:  Procedure Laterality Date   ATRIAL FIBRILLATION ABLATION N/A 02/16/2021   Procedure: ATRIAL FIBRILLATION ABLATION;  Surgeon: Thompson Grayer, MD;  Location: [facility];  Service: Cardiovascular;  Laterality: N/A;   CARDIAC CATHETERIZATION N/A 02/23/2015   Procedure: Left Heart Cath and Coronary  Angiography;  Surgeon: Sherren Mocha, MD;  Location: [facility];  Service: Cardiovascular;  Laterality: N/A;   CARDIAC CATHETERIZATION N/A 02/23/2015   Procedure: Coronary Stent Intervention;  Surgeon: Sherren Mocha, MD;  Location: [facility];  Service: Cardiovascular;  Laterality: N/A;   CARDIAC CATHETERIZATION N/A 01/04/2016   Procedure: Left Heart Cath and Coronary Angiography;  Surgeon: Peter M Martinique, MD;  Location: [facility];  Service: Cardiovascular;  Laterality: N/A;   CATARACT EXTRACTION     CORONARY ANGIOPLASTY  02/23/2015   CORONARY ANGIOPLASTY WITH STENT PLACEMENT  2009   CORONARY STENT INTERVENTION N/A 03/09/2020   Procedure: CORONARY STENT INTERVENTION;  Surgeon: Jettie Booze, MD;  Location: [facility];  Service: Cardiovascular;  Laterality: N/A;   DILATION AND CURETTAGE OF UTERUS     implantable loop recorder placement  04/01/2020   Medtronic Reveal Linq model LNQ 22 [id number] [id number]) implantable loop recorder    LEFT HEART CATH AND CORONARY ANGIOGRAPHY N/A 03/09/2020   Procedure: LEFT HEART CATH AND CORONARY ANGIOGRAPHY;  Surgeon: Jettie Booze, MD;  Location: [facility];  Service: Cardiovascular;  Laterality: N/A;   TUBAL LIGATION      Short Social History:  Social History   Tobacco Use   Smoking status: Never   Smokeless tobacco: Never  Substance Use Topics   Alcohol use: Yes    Alcohol/week: 1.0 standard drink    Types: 1 Glasses of wine per week    Comment: RARE OCCASION    No Known Allergies  Current Outpatient Medications  Medication Sig Dispense Refill   apixaban (ELIQUIS) 5 MG TABS tablet Take 1 tablet (5 mg total) by mouth 2 (two) times daily. 180 tablet 3   Ascorbic Acid (VITAMIN C) 500 MG tablet Take 500 mg by mouth daily.       bimatoprost (LUMIGAN) 0.01 % SOLN Place 1 drop into both eyes at bedtime.     Calcium Carbonate Antacid (TUMS PO) Take 2 tablets by mouth 4 (four) times daily as needed (for acid  reflux/indigestion).      Camphor-Menthol-Methyl Sal (SALONPAS) 3.02-26-08 % PTCH Apply 1 patch topically daily as needed (pain).     Cholecalciferol (VITAMIN D) 400 UNITS capsule Take 400 Units by mouth daily.       ciclopirox (PENLAC) 8 % solution Apply 1 application topically at bedtime.     clopidogrel (PLAVIX) 75 MG tablet Take 1 tablet (75 mg total) by mouth daily with breakfast. 90 tablet 3   Cyanocobalamin (VITAMIN B 12 PO) Take 1 tablet by mouth daily.     diclofenac Sodium (VOLTAREN) 1 % GEL Apply 2 g topically 4 (four) times daily. (Patient taking differently: Apply 2 g topically 4 (four) times daily as needed (pain).) 50 g 2   diltiazem (CARDIZEM CD) 300 MG 24 hr capsule Take 1 capsule (300 mg total) by mouth daily. 90 capsule 3   furosemide (LASIX) 20 MG tablet Take 1 tablet (20 mg total) by mouth daily as needed for edema. 30 tablet    Garlic 169 MG TABS Take 1 tablet by mouth at bedtime.     HYDROcodone-acetaminophen (NORCO) 5-325 MG tablet Take 1 tablet by mouth every 6 (six) hours as needed for moderate pain. 120 tablet 0   levothyroxine (SYNTHROID) 50 MCG tablet Take 1 tablet (50 mcg total) by mouth daily before breakfast. 90 tablet 3   losartan (COZAAR) 50 MG tablet Take 1 tablet (50 mg total) by mouth daily. 90 tablet 3   MAGNESIUM CARBONATE PO Take 1 tablet by mouth daily. 400 mg     metFORMIN (GLUCOPHAGE-XR) 500 MG 24 hr tablet Take 1 tablet (500 mg total) by mouth daily with breakfast. 30 tablet 4   metoprolol succinate (TOPROL XL) 100 MG 24 hr tablet Take 1 tablet (100 mg total) by mouth daily. Take with or immediately following a meal. 30 tablet 11   nitroGLYCERIN (NITROSTAT) 0.4 MG SL tablet Place 0.4 mg under the tongue every 5 (five) minutes as needed for chest pain.     pantoprazole (PROTONIX) 40 MG tablet Take 1 tablet (40 mg total) by mouth daily. 45 tablet 0   promethazine (PHENERGAN) 25 MG tablet Take 1 tablet by mouth every 8 (eight) hours as needed for nausea or  vomiting. (Patient not taking: Reported on 03/04/2021)     rosuvastatin (CRESTOR) 20 MG tablet TAKE ONE (1) TABLET BY MOUTH EVERY DAY 90 tablet 3   timolol (TIMOPTIC) 0.5 % ophthalmic solution Place 1 drop into both eyes daily.     traZODone (DESYREL) 100 MG tablet TAKE ONE TABLET BY MOUTH AT BEDTIME 90 tablet 0   TURMERIC PO Take 1 tablet by mouth daily.  VENTOLIN HFA 108 (90 Base) MCG/ACT inhaler Inhale 1-2 puffs into the lungs every 4 (four) hours as needed for shortness of breath. 18 g 1   No current facility-administered medications for this visit.    Review of Systems  Constitutional: Positive for fatigue.  HENT: HENT negative.  Eyes: Eyes negative.  Respiratory: Respiratory negative.  Cardiovascular: Positive for irregular heartbeat and leg swelling.  GI: Positive for nausea.  Musculoskeletal: Musculoskeletal negative.  Skin: Skin negative.  Neurological: Neurological negative. Hematologic: Hematologic/lymphatic negative.  Psychiatric: Psychiatric negative.       Objective:  Objective  Vitals:   03/16/21 0843  BP: (!) 149/75  Pulse: 67  Resp: 20  Temp: 97.7 F (36.5 C)  SpO2: 96%      Physical Exam HENT:     Head: Normocephalic.     Nose:     Comments: Wearing a mask Eyes:     Pupils: Pupils are equal, round, and reactive to light.  Cardiovascular:     Rate and Rhythm: Normal rate.  Pulmonary:     Effort: Pulmonary effort is normal.  Abdominal:     General: Abdomen is flat.     Palpations: Abdomen is soft.  Musculoskeletal:        General: Normal range of motion.     Right lower leg: Edema present.     Comments: Right lower extremity measures 32 cm, left lower extremity is 31 cm both measured 10 cm from the tibial tuberosity  Skin:    General: Skin is warm and dry.     Capillary Refill: Capillary refill takes less than 2 seconds.  Neurological:     Mental Status: She is alert.  Psychiatric:        Mood and Affect: Mood normal.        Behavior:  Behavior normal.        Thought Content: Thought content normal.    Data: Venous Reflux Times  +--------------+---------+------+-----------+------------+--------+   RIGHT          Reflux No Reflux Reflux Time Diameter cms Comments                              Yes                                       +--------------+---------+------+-----------+------------+--------+   CFV                       yes    >1 second                          +--------------+---------+------+-----------+------------+--------+   FV prox        no                                                   +--------------+---------+------+-----------+------------+--------+   FV mid         no                                                   +--------------+---------+------+-----------+------------+--------+  FV dist        no                                                   +--------------+---------+------+-----------+------------+--------+   Popliteal      no                                                   +--------------+---------+------+-----------+------------+--------+   GSV at [facility]     no                              0.432                +--------------+---------+------+-----------+------------+--------+   GSV prox thigh no                              0.327                +--------------+---------+------+-----------+------------+--------+   GSV mid thigh  no                              0.324                +--------------+---------+------+-----------+------------+--------+   GSV dist thigh no                              0.293                +--------------+---------+------+-----------+------------+--------+   GSV at knee    no                              0.235                +--------------+---------+------+-----------+------------+--------+   GSV prox calf                                  0.224                +--------------+---------+------+-----------+------------+--------+   GSV mid calf                                    0.174                +--------------+---------+------+-----------+------------+--------+   SSV Pop Fossa  no                              0.185                +--------------+---------+------+-----------+------------+--------+   SSV prox calf  no                              0.239                +--------------+---------+------+-----------+------------+--------+  SSV mid calf                                   0.200                +--------------+---------+------+-----------+------------+--------+      Summary:  Right:  - No evidence of superficial venous thrombosis in the right lower  extremity.  - No evidence of superficial venous reflux seen in the right greater  saphenous vein.  - No evidence of superficial venous reflux seen in the right short  saphenous vein.  - Color duplex evaluation of the right lower extremity shows there is  thrombus in the common femoral vein.  - Venous reflux is noted in the right common femoral vein.      Assessment/Plan:     81 year old female status post above-noted procedure found to have right common femoral vein DVT with swelling of the right lower extremity which at this point is less than 5% and very tolerable for her.  I recommended gentle compression knee-high at least on the right side walking as tolerated and leg elevation when recumbent.  She is to continue Eliquis at the direction of cardiology but will need for 3 months at least based on her recent DVT.  She can follow-up with me on as-needed basis     Waynetta Sandy MD [facility]

## 2021-03-16 NOTE — Telephone Encounter (Signed)
Pt's daughter aware of provider feedback and voiced understanding.

## 2021-03-16 NOTE — Progress Notes (Signed)
Primary Care Physician: Ivy Lynn, NP Primary Cardiologist: Dr Domenic Polite Primary Electrophysiologist: Dr Rayann Heman Referring Physician: Dr Reinaldo Raddle [name] is a 81 y.o. female with a history of CAD, HTN, DM, HLD, and persistent atrial fibrillation who presents for follow up in the [facility]. The patient was initially diagnosed with atrial fibrillation on 11/2017 after presenting with symptoms of palpitations and chest pain to ER and found to be in afib with RVR. She was discharged with Eliquis and rate control with plan to cardiovert on f/u but she had converted on her own. She denies snoring or significant alcohol use. Patient was seen at the ER 11/11/18 with symptoms of SOB, chest tightness, and palpitations for 3 days. She was found to be in afib with RVR and underwent successful DCCV. Zio patch prior to this episode showed no afib but did show very frequent short episodes of SVT. She was started on amiodarone which controlled her afib but unfortunately she developed hypothyroidism and this was discontinued.   Patient is s/p afib ablation with Dr Rayann Heman on 02/16/21. There were multiple atypical atrial flutter circuits noted. Post ablation, she had an increase in edema in her right leg, going up to her mid thigh. She started Lasix daily with only minimal improvement. She reports compliance with Eliquis and Plavix. Venous u/s showed DVT in R common femoral vein.   On follow up today, patient reports that she has done well since her last visit. Her swelling in her R leg has improved but not resolved. She saw Dr Donzetta Matters this AM and recommendation was to continue Eliquis and use compression stockings. She is in SR today.   Today, she denies symptoms of palpitations, chest pain, orthopnea, PND, dizziness, presyncope, syncope, snoring, daytime somnolence, bleeding, or neurologic sequela. The patient is tolerating medications without difficulties and is otherwise  without complaint today.    Atrial Fibrillation Risk Factors:  she does not have symptoms or diagnosis of sleep apnea. she does not have a history of rheumatic fever. she does not have a history of alcohol use. The patient does not have a history of early familial atrial fibrillation or other arrhythmias.  she has a BMI of Body mass index is 28.39 kg/m.Marland Kitchen Filed Weights   03/16/21 1048  Weight: 75 kg      Family History  Problem Relation Age of Onset   Other Mother        "natural causes"   Heart disease Mother    Cancer Father        unsure of type     Atrial Fibrillation Management history:  Previous antiarrhythmic drugs: amiodarone  Previous cardioversions: 11/11/18 Previous ablations: 02/16/21 CHADS2VASC score: 6 Anticoagulation history: Eliquis   Past Medical History:  Diagnosis Date   Anxiety    Arthritis    Bulging lumbar disc    Chronic lower back pain    Coronary atherosclerosis of native coronary artery    DES LAD 02/2008, DES LAD 02/2015, DES ramus and DES x2 mid RCA 02/2020   Depression    Dyslipidemia    Essential hypertension    Facial numbness    Frequent headaches    GERD (gastroesophageal reflux disease)    Glaucoma    Heart attack ([place])    Insomnia    Paroxysmal atrial fibrillation ([place])    Diagnosed October 2019   PSVT (paroxysmal supraventricular tachycardia) ([place])    Refusal of blood transfusions as patient is Jehovah's Witness  Thyroid disease    Type 2 diabetes mellitus ([place])    Borderline   Vitamin D deficiency    Past Surgical History:  Procedure Laterality Date   ATRIAL FIBRILLATION ABLATION N/A 02/16/2021   Procedure: ATRIAL FIBRILLATION ABLATION;  Surgeon: Thompson Grayer, MD;  Location: [facility];  Service: Cardiovascular;  Laterality: N/A;   CARDIAC CATHETERIZATION N/A 02/23/2015   Procedure: Left Heart Cath and Coronary Angiography;  Surgeon: Sherren Mocha, MD;  Location: [facility];  Service:  Cardiovascular;  Laterality: N/A;   CARDIAC CATHETERIZATION N/A 02/23/2015   Procedure: Coronary Stent Intervention;  Surgeon: Sherren Mocha, MD;  Location: [facility];  Service: Cardiovascular;  Laterality: N/A;   CARDIAC CATHETERIZATION N/A 01/04/2016   Procedure: Left Heart Cath and Coronary Angiography;  Surgeon: Peter M Martinique, MD;  Location: [facility];  Service: Cardiovascular;  Laterality: N/A;   CATARACT EXTRACTION     CORONARY ANGIOPLASTY  02/23/2015   CORONARY ANGIOPLASTY WITH STENT PLACEMENT  2009   CORONARY STENT INTERVENTION N/A 03/09/2020   Procedure: CORONARY STENT INTERVENTION;  Surgeon: Jettie Booze, MD;  Location: [facility];  Service: Cardiovascular;  Laterality: N/A;   DILATION AND CURETTAGE OF UTERUS     implantable loop recorder placement  04/01/2020   Medtronic Reveal Linq model LNQ 22 [id number]) implantable loop recorder    LEFT HEART CATH AND CORONARY ANGIOGRAPHY N/A 03/09/2020   Procedure: LEFT HEART CATH AND CORONARY ANGIOGRAPHY;  Surgeon: Jettie Booze, MD;  Location: [facility];  Service: Cardiovascular;  Laterality: N/A;   TUBAL LIGATION      Current Outpatient Medications  Medication Sig Dispense Refill   apixaban (ELIQUIS) 5 MG TABS tablet Take 1 tablet (5 mg total) by mouth 2 (two) times daily. 180 tablet 3   Ascorbic Acid (VITAMIN C) 500 MG tablet Take 500 mg by mouth daily.       bimatoprost (LUMIGAN) 0.01 % SOLN Place 1 drop into both eyes at bedtime.     Calcium Carbonate Antacid (TUMS PO) Take 2 tablets by mouth 4 (four) times daily as needed (for acid reflux/indigestion).      Camphor-Menthol-Methyl Sal (SALONPAS) 3.02-26-08 % PTCH Apply 1 patch topically daily as needed (pain).     Cholecalciferol (VITAMIN D) 400 UNITS capsule Take 400 Units by mouth daily.       ciclopirox (PENLAC) 8 % solution Apply 1 application topically at bedtime.     clopidogrel (PLAVIX) 75 MG tablet Take 1 tablet (75 mg total) by  mouth daily with breakfast. 90 tablet 3   Cyanocobalamin (VITAMIN B 12 PO) Take 1 tablet by mouth daily.     diclofenac Sodium (VOLTAREN) 1 % GEL Apply 2 g topically 4 (four) times daily. 50 g 2   diltiazem (CARDIZEM CD) 300 MG 24 hr capsule Take 1 capsule (300 mg total) by mouth daily. 90 capsule 3   furosemide (LASIX) 20 MG tablet Take 1 tablet (20 mg total) by mouth daily as needed for edema. 30 tablet    Garlic 854 MG TABS Take 1 tablet by mouth at bedtime.     HYDROcodone-acetaminophen (NORCO) 5-325 MG tablet Take 1 tablet by mouth every 6 (six) hours as needed for moderate pain. 120 tablet 0   levothyroxine (SYNTHROID) 50 MCG tablet Take 1 tablet (50 mcg total) by mouth daily before breakfast. 90 tablet 3   losartan (COZAAR) 50 MG tablet Take 1 tablet (50 mg total) by mouth  daily. 90 tablet 3   MAGNESIUM CARBONATE PO Take 1 tablet by mouth daily. 400 mg     metFORMIN (GLUCOPHAGE-XR) 500 MG 24 hr tablet Take 1 tablet (500 mg total) by mouth daily with breakfast. 30 tablet 4   metoprolol succinate (TOPROL XL) 100 MG 24 hr tablet Take 1 tablet (100 mg total) by mouth daily. Take with or immediately following a meal. 30 tablet 11   nitroGLYCERIN (NITROSTAT) 0.4 MG SL tablet Place 0.4 mg under the tongue every 5 (five) minutes as needed for chest pain.     pantoprazole (PROTONIX) 40 MG tablet Take 1 tablet (40 mg total) by mouth daily. 45 tablet 0   promethazine (PHENERGAN) 25 MG tablet Take 1 tablet by mouth every 8 (eight) hours as needed for nausea or vomiting.     rosuvastatin (CRESTOR) 20 MG tablet TAKE ONE (1) TABLET BY MOUTH EVERY DAY 90 tablet 3   timolol (TIMOPTIC) 0.5 % ophthalmic solution Place 1 drop into both eyes daily.     traZODone (DESYREL) 100 MG tablet TAKE ONE TABLET BY MOUTH AT BEDTIME 90 tablet 0   TURMERIC PO Take 1 tablet by mouth daily.     VENTOLIN HFA 108 (90 Base) MCG/ACT inhaler Inhale 1-2 puffs into the lungs every 4 (four) hours as needed for shortness of breath. 18  g 1   No current facility-administered medications for this encounter.    No Known Allergies  Social History   Socioeconomic History   Marital status: Widowed    Spouse name: Not on file   Number of children: 8   Years of education: 10th grade   Highest education level: 10th grade  Occupational History   Occupation: RETIRED  Tobacco Use   Smoking status: Never   Smokeless tobacco: Never  Vaping Use   Vaping Use: Never used  Substance and Sexual Activity   Alcohol use: Yes    Alcohol/week: 1.0 standard drink    Types: 1 Glasses of wine per week    Comment: RARE OCCASION   Drug use: No   Sexual activity: Not Currently  Other Topics Concern   Not on file  Social History Narrative   Lives at home alone.   Right-handed.   No caffeine use.   Daughter lives next door and helps her out   Social Determinants of Health   Financial Resource Strain: Not on file  Food Insecurity: Not on file  Transportation Needs: Not on file  Physical Activity: Not on file  Stress: No Stress Concern Present   Feeling of Stress : Only a little  Social Connections: Not on file  Intimate Partner Violence: Not on file     ROS- All systems are reviewed and negative except as per the HPI above.  Physical Exam: Vitals:   03/16/21 1048  BP: 132/66  Pulse: 70  Weight: 75 kg  Height: 5\' 4"  (1.626 m)    GEN- The patient is a well appearing elderly female, alert and oriented x 3 today.   HEENT-head normocephalic, atraumatic, sclera clear, conjunctiva pink, hearing intact, trachea midline. Lungs- Clear to ausculation bilaterally, normal work of breathing Heart- Regular rate and rhythm, no murmurs, rubs or gallops  GI- soft, NT, ND, + BS Extremities- no clubbing, cyanosis. Bilateral lower extremity edema, R>L MS- no significant deformity or atrophy Skin- no rash or lesion Psych- euthymic mood, full affect Neuro- strength and sensation are intact   Wt Readings from Last 3 Encounters:   03/16/21 75 kg  03/16/21 74.8 kg  03/04/21 76.2 kg    EKG today demonstrates  SR, PAC Vent. rate 70 BPM PR interval 180 ms QRS duration 70 ms QT/QTcB 408/440 ms  Echo 12/11/19 demonstrated   1. Left ventricular ejection fraction, by estimation, is 65 to 70%. The  left ventricle has normal function. The left ventricle has no regional  wall motion abnormalities. There is mild left ventricular hypertrophy.  Left ventricular diastolic parameters are consistent with Grade II diastolic dysfunction (pseudonormalization). Elevated left atrial pressure.   2. Right ventricular systolic function is normal. The right ventricular  size is normal. There is mildly elevated pulmonary artery systolic  pressure.   3. The mitral valve is normal in structure. Mild mitral valve  regurgitation. No evidence of mitral stenosis. Moderate mitral annular  calcification.   4. The aortic valve has an indeterminant number of cusps. There is mild calcification of the aortic valve. There is mild thickening of the aortic valve. Aortic valve regurgitation is mild. No aortic stenosis is present.   5. Mild pulmonary HTN, PASP is 36 mmHg.   6. The inferior vena cava is normal in size with greater than 50%  respiratory variability, suggesting right atrial pressure of 3 mmHg.    Epic records are reviewed at length today   Assessment and Plan: 1. Persistent atrial fibrillation/SVT Recall amiodarone d/c 2/2 elevated TSH (stopped 11/11/19) Would avoid class IC with h/o CAD. S/p afib ablation with Dr Rayann Heman on 02/16/21 Patient in [place] today.  Continue Eliquis 5 mg BID with no missed doses for 3 months post ablation.  Continue diltiazem 300 mg daily Continue Toprol 100 mg daily  This patients CHA2DS2-VASc Score and unadjusted Ischemic Stroke Rate (% per year) is equal to 9.7 % stroke rate/year from a score of 6  Above score calculated as 1 point each if present [CHF, HTN, DM, Vascular=MI/PAD/Aortic Plaque, Age if  65-74, or Female] Above score calculated as 2 points each if present [Age > 75, or Stroke/TIA/TE]  2. CAD No anginal symptoms.  3. HTN Stable, no changes today.  4. Chronic diastolic CHF No signs or symptoms of fluid overload today.  5. DVT 03/01/21 u/s + DVT in common femoral vein.  Seen by Dr Donzetta Matters earlier today, recommended to continue Eliquis at present dose. Compression stocking.   Follow up with Dr Rayann Heman post ablation. AF clinic in 6 months.     [facility] [address] [place], [place] [address] [phone] 03/16/2021 11:19 AM

## 2021-03-18 ENCOUNTER — Other Ambulatory Visit: Payer: Self-pay | Admitting: Nurse Practitioner

## 2021-03-18 DIAGNOSIS — R200 Anesthesia of skin: Secondary | ICD-10-CM

## 2021-03-18 MED ORDER — NYSTATIN 100000 UNIT/ML MT SUSP: 5.0000 mL | Freq: Four times a day (QID) | OROMUCOSAL | 2 refills | Status: DC | PRN

## 2021-03-18 NOTE — Progress Notes (Signed)
Carelink Summary Report / Loop Recorder 

## 2021-03-28 ENCOUNTER — Telehealth: Payer: Self-pay | Admitting: Internal Medicine

## 2021-03-28 NOTE — Telephone Encounter (Signed)
Patient's daughter called and wanted to see if patient still need to talk blood thinners. Also wants to know if patient still get blood clots. Please call to discuss

## 2021-03-28 NOTE — Telephone Encounter (Signed)
Notified daughter Anihya Tuma) - per Dr. Domenic Polite dictation from [place] on 12/31/2020 -   2.  CAD status post DES to the LAD in 2010, DES to the LAD in 2017, and DES to the ramus intermedius as well as DES x2 to the RCA in January 2022.  She is doing well without active angina and will continue Plavix through January 2023 then discontinue.  Continue Crestor and as needed nitroglycerin.  Also, reminded her that she does need to continue the Eliquis as prescribed.  Daughter verbalized understanding.

## 2021-03-28 NOTE — Telephone Encounter (Signed)
Patient is questioning whether she is supposed to stop Plavix since this was started after stent placement approximately a year ago.  Pt is being followed by Dr. Donzetta Matters regarding DVT and will continue Eliquis.  Instructed pt regarding plavix would be determined by Dr. Domenic Polite. Pt daughter will await call regarding plavix from Dr. Domenic Polite office.

## 2021-03-30 DIAGNOSIS — H401131 Primary open-angle glaucoma, bilateral, mild stage: Secondary | ICD-10-CM | POA: Diagnosis not present

## 2021-03-30 LAB — HM DIABETES EYE EXAM

## 2021-04-08 ENCOUNTER — Other Ambulatory Visit: Payer: Self-pay | Admitting: Nurse Practitioner

## 2021-04-11 ENCOUNTER — Ambulatory Visit (INDEPENDENT_AMBULATORY_CARE_PROVIDER_SITE_OTHER): Payer: Medicare Other

## 2021-04-11 DIAGNOSIS — I4819 Other persistent atrial fibrillation: Secondary | ICD-10-CM | POA: Diagnosis not present

## 2021-04-11 LAB — CUP PACEART REMOTE DEVICE CHECK
Date Time Interrogation Session: 20230217230139
Implantable Pulse Generator Implant Date: 20220210

## 2021-04-15 ENCOUNTER — Other Ambulatory Visit: Payer: Medicare Other

## 2021-04-15 DIAGNOSIS — I10 Essential (primary) hypertension: Secondary | ICD-10-CM | POA: Diagnosis not present

## 2021-04-15 LAB — COMPREHENSIVE METABOLIC PANEL
ALT: 10 IU/L (ref 0–32)
AST: 16 IU/L (ref 0–40)
Albumin/Globulin Ratio: 2 (ref 1.2–2.2)
Albumin: 4.3 g/dL (ref 3.7–4.7)
Alkaline Phosphatase: 98 IU/L (ref 44–121)
BUN/Creatinine Ratio: 7 — ABNORMAL LOW (ref 12–28)
BUN: 6 mg/dL — ABNORMAL LOW (ref 8–27)
Bilirubin Total: 0.3 mg/dL (ref 0.0–1.2)
CO2: 26 mmol/L (ref 20–29)
Calcium: 9.8 mg/dL (ref 8.7–10.3)
Chloride: 103 mmol/L (ref 96–106)
Creatinine, Ser: 0.87 mg/dL (ref 0.57–1.00)
Globulin, Total: 2.2 g/dL (ref 1.5–4.5)
Glucose: 149 mg/dL — ABNORMAL HIGH (ref 70–99)
Potassium: 4.2 mmol/L (ref 3.5–5.2)
Sodium: 142 mmol/L (ref 134–144)
Total Protein: 6.5 g/dL (ref 6.0–8.5)
eGFR: 67 mL/min/{1.73_m2} (ref >59)

## 2021-04-15 LAB — CBC WITH DIFFERENTIAL/PLATELET
Basophils Absolute: 0 10*3/uL (ref 0.0–0.2)
Basos: 1 %
EOS (ABSOLUTE): 0.3 10*3/uL (ref 0.0–0.4)
Eos: 7 %
Hematocrit: 38 % (ref 34.0–46.6)
Hemoglobin: 12.6 g/dL (ref 11.1–15.9)
Immature Grans (Abs): 0 10*3/uL (ref 0.0–0.1)
Immature Granulocytes: 0 %
Lymphocytes Absolute: 1.5 10*3/uL (ref 0.7–3.1)
Lymphs: 32 %
MCH: 29.2 pg (ref 26.6–33.0)
MCHC: 33.2 g/dL (ref 31.5–35.7)
MCV: 88 fL (ref 79–97)
Monocytes Absolute: 0.4 10*3/uL (ref 0.1–0.9)
Monocytes: 8 %
Neutrophils Absolute: 2.6 10*3/uL (ref 1.4–7.0)
Neutrophils: 52 %
Platelets: 211 10*3/uL (ref 150–450)
RBC: 4.32 x10E6/uL (ref 3.77–5.28)
RDW: 11.9 % (ref 11.7–15.4)
WBC: 4.9 10*3/uL (ref 3.4–10.8)

## 2021-04-15 LAB — LIPID PANEL
Chol/HDL Ratio: 2.4 ratio (ref 0.0–4.4)
Cholesterol, Total: 109 mg/dL (ref 100–199)
HDL: 46 mg/dL (ref >39)
LDL Chol Calc (NIH): 44 mg/dL (ref 0–99)
Triglycerides: 103 mg/dL (ref 0–149)
VLDL Cholesterol Cal: 19 mg/dL (ref 5–40)

## 2021-04-15 NOTE — Progress Notes (Signed)
Carelink Summary Report / Loop Recorder 

## 2021-04-19 ENCOUNTER — Telehealth: Payer: Self-pay | Admitting: *Deleted

## 2021-04-19 ENCOUNTER — Other Ambulatory Visit: Payer: Self-pay | Admitting: Cardiology

## 2021-04-19 NOTE — Telephone Encounter (Signed)
Prescription refill request for Eliquis received. Indication: Atrial Fib Last office visit: 03/16/21  C Fenton PA Scr: 0.87 on 04/15/21 Age:  81 Weight: 75kg  Based on above findings Eliquis 5mg  twice daily is the appropriate dose.  Refill approved.

## 2021-04-19 NOTE — Telephone Encounter (Signed)
Eliquis 5 mg 3% out of pocket expense $331.97 Patient informed and verbalized understanding of plan.

## 2021-05-16 ENCOUNTER — Ambulatory Visit (INDEPENDENT_AMBULATORY_CARE_PROVIDER_SITE_OTHER): Payer: Medicare Other

## 2021-05-16 DIAGNOSIS — I4819 Other persistent atrial fibrillation: Secondary | ICD-10-CM | POA: Diagnosis not present

## 2021-05-18 LAB — CUP PACEART REMOTE DEVICE CHECK
Date Time Interrogation Session: 20230329100118
Implantable Pulse Generator Implant Date: 20220210

## 2021-05-26 ENCOUNTER — Ambulatory Visit: Payer: Medicare Other | Admitting: Internal Medicine

## 2021-05-26 ENCOUNTER — Encounter: Payer: Self-pay | Admitting: Internal Medicine

## 2021-05-26 VITALS — BP 164/70 | HR 75 | Ht 64.0 in | Wt 164.4 lb

## 2021-05-26 DIAGNOSIS — I82401 Acute embolism and thrombosis of unspecified deep veins of right lower extremity: Secondary | ICD-10-CM | POA: Diagnosis not present

## 2021-05-26 DIAGNOSIS — I480 Paroxysmal atrial fibrillation: Secondary | ICD-10-CM

## 2021-05-26 DIAGNOSIS — I10 Essential (primary) hypertension: Secondary | ICD-10-CM

## 2021-05-26 DIAGNOSIS — D6869 Other thrombophilia: Secondary | ICD-10-CM

## 2021-05-26 MED ORDER — DILTIAZEM HCL ER COATED BEADS 180 MG PO CP24: 180.0000 mg | ORAL_CAPSULE | Freq: Every day | ORAL | 3 refills | Status: DC

## 2021-05-26 NOTE — Progress Notes (Signed)
Carelink Summary Report / Loop Recorder 

## 2021-05-26 NOTE — Progress Notes (Signed)
? ?PCP: Ivy Lynn, NP ?Primary Cardiologist: Dr Domenic Polite ? ?[name] is a 81 y.o. female who presents today for routine electrophysiology followup.  Since his recent afib ablation, the patient reports doing very well.  she is pleased with the results of the procedure.  She did have a DVT early post ablation which has clinically resolved.  She was seen by Dr Donzetta Matters and no intervention was required. Today, she denies symptoms of palpitations, chest pain,   lower extremity edema, dizziness, presyncope, or syncope.  She has SOB with moderate activity chronically.  She is not very active.  + rare nausea.  The patient is otherwise without complaint today.  ? ?Past Medical History:  ?Diagnosis Date  ? Anxiety   ? Arthritis   ? Bulging lumbar disc   ? Chronic lower back pain   ? Coronary atherosclerosis of native coronary artery   ? DES LAD 02/2008, DES LAD 02/2015, DES ramus and DES x2 mid RCA 02/2020  ? Depression   ? Dyslipidemia   ? Essential hypertension   ? Facial numbness   ? Frequent headaches   ? GERD (gastroesophageal reflux disease)   ? Glaucoma   ? Heart attack ([place])   ? Insomnia   ? Paroxysmal atrial fibrillation (HCC)   ? Diagnosed October 2019  ? PSVT (paroxysmal supraventricular tachycardia) ([place])   ? Refusal of blood transfusions as patient is Jehovah's Witness   ? Thyroid disease   ? Type 2 diabetes mellitus ([place])   ? Borderline  ? Vitamin D deficiency   ? ?Past Surgical History:  ?Procedure Laterality Date  ? ATRIAL FIBRILLATION ABLATION N/A 02/16/2021  ? Procedure: ATRIAL FIBRILLATION ABLATION;  Surgeon: Thompson Grayer, MD;  Location: [facility];  Service: Cardiovascular;  Laterality: N/A;  ? CARDIAC CATHETERIZATION N/A 02/23/2015  ? Procedure: Left Heart Cath and Coronary Angiography;  Surgeon: Sherren Mocha, MD;  Location: [facility];  Service: Cardiovascular;  Laterality: N/A;  ? CARDIAC CATHETERIZATION N/A 02/23/2015  ? Procedure: Coronary Stent Intervention;  Surgeon: Sherren Mocha, MD;  Location: [facility];  Service: Cardiovascular;  Laterality: N/A;  ? CARDIAC CATHETERIZATION N/A 01/04/2016  ? Procedure: Left Heart Cath and Coronary Angiography;  Surgeon: Peter M Martinique, MD;  Location: [facility];  Service: Cardiovascular;  Laterality: N/A;  ? CATARACT EXTRACTION    ? CORONARY ANGIOPLASTY  02/23/2015  ? CORONARY ANGIOPLASTY WITH STENT PLACEMENT  2009  ? CORONARY STENT INTERVENTION N/A 03/09/2020  ? Procedure: CORONARY STENT INTERVENTION;  Surgeon: Jettie Booze, MD;  Location: [facility];  Service: Cardiovascular;  Laterality: N/A;  ? DILATION AND CURETTAGE OF UTERUS    ? implantable loop recorder placement  04/01/2020  ? Medtronic Reveal Linq model [place] [id number]) implantable loop recorder   ? LEFT HEART CATH AND CORONARY ANGIOGRAPHY N/A 03/09/2020  ? Procedure: LEFT HEART CATH AND CORONARY ANGIOGRAPHY;  Surgeon: Jettie Booze, MD;  Location: [facility];  Service: Cardiovascular;  Laterality: N/A;  ? TUBAL LIGATION    ? ? ?ROS- all systems are personally reviewed and negatives except as per HPI above ? ?Current Outpatient Medications  ?Medication Sig Dispense Refill  ? apixaban (ELIQUIS) 5 MG TABS tablet TAKE ONE TABLET BY MOUTH TWICE DAILY 60 tablet 5  ? Ascorbic Acid (VITAMIN C) 500 MG tablet Take 500 mg by mouth daily.      ? bimatoprost (LUMIGAN) 0.01 % SOLN Place 1 drop into  both eyes at bedtime.    ? Calcium Carbonate Antacid (TUMS PO) Take 2 tablets by mouth 4 (four) times daily as needed (for acid reflux/indigestion).     ? Camphor-Menthol-Methyl Sal (SALONPAS) 3.02-26-08 % PTCH Apply 1 patch topically daily as needed (pain).    ? Cholecalciferol (VITAMIN D) 400 UNITS capsule Take 400 Units by mouth daily.      ? ciclopirox (PENLAC) 8 % solution Apply 1 application topically at bedtime.    ? Cyanocobalamin (VITAMIN B 12 PO) Take 1 tablet by mouth daily.    ? diclofenac Sodium (VOLTAREN) 1 % GEL Apply 2 g topically 4 (four) times  daily. 50 g 2  ? diltiazem (CARDIZEM CD) 300 MG 24 hr capsule Take 1 capsule (300 mg total) by mouth daily. 90 capsule 3  ? furosemide (LASIX) 20 MG tablet Take 1 tablet (20 mg total) by mouth daily as needed for edema. 30 tablet   ? Garlic 449 MG TABS Take 1 tablet by mouth at bedtime.    ? HYDROcodone-acetaminophen (NORCO) 5-325 MG tablet Take 1 tablet by mouth every 6 (six) hours as needed for moderate pain. 120 tablet 0  ? levothyroxine (SYNTHROID) 50 MCG tablet Take 1 tablet (50 mcg total) by mouth daily before breakfast. 90 tablet 3  ? losartan (COZAAR) 50 MG tablet Take 1 tablet (50 mg total) by mouth daily. 90 tablet 3  ? magic mouthwash (nystatin, lidocaine, diphenhydrAMINE) suspension Take 5 mLs by mouth 4 (four) times daily as needed for mouth pain. 180 mL 2  ? MAGNESIUM CARBONATE PO Take 1 tablet by mouth daily. 400 mg    ? metFORMIN (GLUCOPHAGE-XR) 500 MG 24 hr tablet Take 1 tablet (500 mg total) by mouth daily with breakfast. 30 tablet 4  ? metoprolol succinate (TOPROL XL) 100 MG 24 hr tablet Take 1 tablet (100 mg total) by mouth daily. Take with or immediately following a meal. 30 tablet 11  ? nitroGLYCERIN (NITROSTAT) 0.4 MG SL tablet Place 0.4 mg under the tongue every 5 (five) minutes as needed for chest pain.    ? promethazine (PHENERGAN) 25 MG tablet TAKE 1 TABLET EVERY 12 HOURS AS NEEDED FOR NAUSEA 60 tablet 0  ? rosuvastatin (CRESTOR) 20 MG tablet TAKE ONE (1) TABLET BY MOUTH EVERY DAY 90 tablet 3  ? timolol (TIMOPTIC) 0.5 % ophthalmic solution Place 1 drop into both eyes daily.    ? traZODone (DESYREL) 100 MG tablet TAKE ONE TABLET BY MOUTH AT BEDTIME 90 tablet 0  ? TURMERIC PO Take 1 tablet by mouth daily.    ? VENTOLIN HFA 108 (90 Base) MCG/ACT inhaler Inhale 1-2 puffs into the lungs every 4 (four) hours as needed for shortness of breath. 18 g 1  ? ?No current facility-administered medications for this visit.  ? ? ?Physical Exam: ?Vitals:  ? 05/26/21 1110  ?BP: (!) 164/70  ?Pulse: 75  ?SpO2:  98%  ?Weight: 164 lb 6.4 oz (74.6 kg)  ?Height: '5\' 4"'$  (1.626 m)  ? ? ?GEN- The patient is well appearing, alert and oriented x 3 today.   ?Head- normocephalic, atraumatic ?Eyes-  Sclera clear, conjunctiva pink ?Ears- hearing intact ?Oropharynx- clear ?Lungs- Clear to ausculation bilaterally, normal work of breathing ?Heart- Regular rate and rhythm, no murmurs, rubs or gallops, PMI not laterally displaced ?GI- soft, NT, ND, + BS ?Extremities- no clubbing, cyanosis, or edema ? ?EKG tracing ordered today is personally reviewed and shows sinus ? ?Assessment and Plan: ? ?1. Persistent atrial fibrillation ?  Doing well s/p ablation ?No afib over the past 2 months ?chads2vasc score is 6.  She is on eliquis ?Wean diltiazem to '180mg'$  daily.  Dr Domenic Polite can wean further on follow-up ? ?2. HTN ?Elevated ?She has not taken her meds today ?I have advised that she keep up w her BP at home. ?Consider increasing ARB if elevated on return to see Dr Domenic Polite ? ?3. CAD ?No ischemic symptoms ?No changes ? ?4. Chronic diastolic dysfunction ?Stable ?No change required today ? ?5. DVT post ablation ?Dr Claretha Cooper notes reviewed ?Continue eliquis ?Appears clinically improved ? ? ?Return to see me in 6 months in [place] ? ?Thompson Grayer MD, FACC ?05/26/2021 ?11:25 AM ? ? ? ? ?

## 2021-05-26 NOTE — Patient Instructions (Addendum)
Medication Instructions:  ?Reduce you Cardizem to 180 mg daily ?Your physician recommends that you continue on your current medications as directed. Please refer to the Current Medication list given to you today. ?*If you need a refill on your cardiac medications before your next appointment, please call your pharmacy* ? ?Lab Work: ?None. ?If you have labs (blood work) drawn today and your tests are completely normal, you will receive your results only by: ?MyChart Message (if you have MyChart) OR ?A paper copy in the mail ?If you have any lab test that is abnormal or we need to change your treatment, we will call you to review the results. ? ?Testing/Procedures: ?None. ? ?Follow-Up: ?At [facility], you and your health needs are our priority.  As part of our continuing mission to provide you with exceptional heart care, we have created designated Provider Care Teams.  These Care Teams include your primary Cardiologist (physician) and Advanced Practice Providers (APPs -  Physician Assistants and Nurse Practitioners) who all work together to provide you with the care you need, when you need it. ? ?Your physician wants you to follow-up in: 6 months with Thompson Grayer, MD in [place].  ? ?We recommend signing up for the patient portal called "MyChart".  Sign up information is provided on this After Visit Summary.  MyChart is used to connect with patients for Virtual Visits (Telemedicine).  Patients are able to view lab/test results, encounter notes, upcoming appointments, etc.  Non-urgent messages can be sent to your provider as well.   ?To learn more about what you can do with MyChart, go to [email or web address].   ? ?Any Other Special Instructions Will Be Listed Below (If Applicable). ? ? ? ? ?  ? ? ?

## 2021-06-08 ENCOUNTER — Other Ambulatory Visit: Payer: Self-pay | Admitting: Nurse Practitioner

## 2021-06-09 NOTE — Telephone Encounter (Signed)
I am okay with refilling for 30 days but needs to be seen ?

## 2021-06-14 NOTE — Telephone Encounter (Signed)
LMOVM to call & make appt w/ Je since last OV was in Jan ?

## 2021-06-17 ENCOUNTER — Ambulatory Visit: Payer: Medicare Other | Admitting: Nurse Practitioner

## 2021-06-20 ENCOUNTER — Ambulatory Visit (INDEPENDENT_AMBULATORY_CARE_PROVIDER_SITE_OTHER): Payer: Medicare Other

## 2021-06-20 DIAGNOSIS — I480 Paroxysmal atrial fibrillation: Secondary | ICD-10-CM

## 2021-06-21 LAB — CUP PACEART REMOTE DEVICE CHECK
Date Time Interrogation Session: 20230502084355
Implantable Pulse Generator Implant Date: 20220210

## 2021-06-24 ENCOUNTER — Ambulatory Visit (INDEPENDENT_AMBULATORY_CARE_PROVIDER_SITE_OTHER): Payer: Medicare Other | Admitting: Nurse Practitioner

## 2021-06-24 ENCOUNTER — Encounter: Payer: Self-pay | Admitting: Nurse Practitioner

## 2021-06-24 VITALS — BP 171/68 | HR 69 | Temp 97.0°F | Ht 64.0 in | Wt 163.4 lb

## 2021-06-24 DIAGNOSIS — E119 Type 2 diabetes mellitus without complications: Secondary | ICD-10-CM | POA: Diagnosis not present

## 2021-06-24 DIAGNOSIS — F5101 Primary insomnia: Secondary | ICD-10-CM | POA: Diagnosis not present

## 2021-06-24 DIAGNOSIS — M79605 Pain in left leg: Secondary | ICD-10-CM

## 2021-06-24 DIAGNOSIS — M549 Dorsalgia, unspecified: Secondary | ICD-10-CM

## 2021-06-24 DIAGNOSIS — Z23 Encounter for immunization: Secondary | ICD-10-CM

## 2021-06-24 DIAGNOSIS — I10 Essential (primary) hypertension: Secondary | ICD-10-CM | POA: Diagnosis not present

## 2021-06-24 LAB — BAYER DCA HB A1C WAIVED: HB A1C (BAYER DCA - WAIVED): 6.4 % — ABNORMAL HIGH (ref 4.8–5.6)

## 2021-06-24 MED ORDER — HYDROCODONE-ACETAMINOPHEN 5-325 MG PO TABS: 1.0000 | ORAL_TABLET | Freq: Four times a day (QID) | ORAL | 0 refills | Status: DC | PRN

## 2021-06-24 MED ORDER — TRAZODONE HCL 150 MG PO TABS: 150.0000 mg | ORAL_TABLET | Freq: Every day | ORAL | 1 refills | Status: DC

## 2021-06-24 MED ORDER — METFORMIN HCL ER 500 MG PO TB24: 500.0000 mg | ORAL_TABLET | Freq: Every day | ORAL | 2 refills | Status: DC

## 2021-06-24 MED ORDER — METFORMIN HCL ER 500 MG PO TB24: 500.0000 mg | ORAL_TABLET | Freq: Every day | ORAL | 0 refills | Status: DC

## 2021-06-24 MED ORDER — TRAZODONE HCL 100 MG PO TABS: 100.0000 mg | ORAL_TABLET | Freq: Every day | ORAL | 1 refills | Status: DC

## 2021-06-24 NOTE — Assessment & Plan Note (Signed)
Hypertension well-controlled on current medication no changes necessary.  Patient did not take blood pressure medication before visit today. ?

## 2021-06-24 NOTE — Patient Instructions (Signed)
Hypertension, Adult ?Hypertension is another name for high blood pressure. High blood pressure forces your heart to work harder to pump blood. This can cause problems over time. ?There are two numbers in a blood pressure reading. There is a top number (systolic) over a bottom number (diastolic). It is best to have a blood pressure that is below 120/80. ?What are the causes? ?The cause of this condition is not known. Some other conditions can lead to high blood pressure. ?What increases the risk? ?Some lifestyle factors can make you more likely to develop high blood pressure: ?Smoking. ?Not getting enough exercise or physical activity. ?Being overweight. ?Having too much fat, sugar, calories, or salt (sodium) in your diet. ?Drinking too much alcohol. ?Other risk factors include: ?Having any of these conditions: ?Heart disease. ?Diabetes. ?High cholesterol. ?Kidney disease. ?Obstructive sleep apnea. ?Having a family history of high blood pressure and high cholesterol. ?Age. The risk increases with age. ?Stress. ?What are the signs or symptoms? ?High blood pressure may not cause symptoms. Very high blood pressure (hypertensive crisis) may cause: ?Headache. ?Fast or uneven heartbeats (palpitations). ?Shortness of breath. ?Nosebleed. ?Vomiting or feeling like you may vomit (nauseous). ?Changes in how you see. ?Very bad chest pain. ?Feeling dizzy. ?Seizures. ?How is this treated? ?This condition is treated by making healthy lifestyle changes, such as: ?Eating healthy foods. ?Exercising more. ?Drinking less alcohol. ?Your doctor may prescribe medicine if lifestyle changes do not help enough and if: ?Your top number is above 130. ?Your bottom number is above 80. ?Your personal target blood pressure may vary. ?Follow these instructions at home: ?Eating and drinking ? ?If told, follow the DASH eating plan. To follow this plan: ?Fill one half of your plate at each meal with fruits and vegetables. ?Fill one fourth of your plate  at each meal with whole grains. Whole grains include whole-wheat pasta, brown rice, and whole-grain bread. ?Eat or drink low-fat dairy products, such as skim milk or low-fat yogurt. ?Fill one fourth of your plate at each meal with low-fat (lean) proteins. Low-fat proteins include fish, chicken without skin, eggs, beans, and tofu. ?Avoid fatty meat, cured and processed meat, or chicken with skin. ?Avoid pre-made or processed food. ?Limit the amount of salt in your diet to less than 1,500 mg each day. ?Do not drink alcohol if: ?Your doctor tells you not to drink. ?You are pregnant, may be pregnant, or are planning to become pregnant. ?If you drink alcohol: ?Limit how much you have to: ?0-1 drink a day for women. ?0-2 drinks a day for men. ?Know how much alcohol is in your drink. In the [place] one drink equals one 12 oz bottle of beer (355 mL), one 5 oz glass of wine (148 mL), or one 1? oz glass of hard liquor (44 mL). ?Lifestyle ? ?Work with your doctor to stay at a healthy weight or to lose weight. Ask your doctor what the best weight is for you. ?Get at least 30 minutes of exercise that causes your heart to beat faster (aerobic exercise) most days of the week. This may include walking, swimming, or biking. ?Get at least 30 minutes of exercise that strengthens your muscles (resistance exercise) at least 3 days a week. This may include lifting weights or doing Pilates. ?Do not smoke or use any products that contain nicotine or tobacco. If you need help quitting, ask your doctor. ?Check your blood pressure at home as told by your doctor. ?Keep all follow-up visits. ?Medicines ?Take over-the-counter and prescription medicines   only as told by your doctor. Follow directions carefully. ?Do not skip doses of blood pressure medicine. The medicine does not work as well if you skip doses. Skipping doses also puts you at risk for problems. ?Ask your doctor about side effects or reactions to medicines that you should watch  for. ?Contact a doctor if: ?You think you are having a reaction to the medicine you are taking. ?You have headaches that keep coming back. ?You feel dizzy. ?You have swelling in your ankles. ?You have trouble with your vision. ?Get help right away if: ?You get a very bad headache. ?You start to feel mixed up (confused). ?You feel weak or numb. ?You feel faint. ?You have very bad pain in your: ?Chest. ?Belly (abdomen). ?You vomit more than once. ?You have trouble breathing. ?These symptoms may be an emergency. Get help right away. Call 911. ?Do not wait to see if the symptoms will go away. ?Do not drive yourself to the hospital. ?Summary ?Hypertension is another name for high blood pressure. ?High blood pressure forces your heart to work harder to pump blood. ?For most people, a normal blood pressure is less than 120/80. ?Making healthy choices can help lower blood pressure. If your blood pressure does not get lower with healthy choices, you may need to take medicine. ?This information is not intended to replace advice given to you by your health care provider. Make sure you discuss any questions you have with your health care provider. ?Document Revised: 11/25/2020 Document Reviewed: 11/25/2020 ?Elsevier Patient Education ? [place]. ?Diabetes Mellitus Basics ? ?Diabetes mellitus, or diabetes, is a long-term (chronic) disease. It occurs when the body does not properly use sugar (glucose) that is released from food after you eat. ?Diabetes mellitus may be caused by one or both of these problems: ?Your pancreas does not make enough of a hormone called insulin. ?Your body does not react in a normal way to the insulin that it makes. ?Insulin lets glucose enter cells in your body. This gives you energy. If you have diabetes, glucose cannot get into cells. This causes high blood glucose (hyperglycemia). ?How to treat and manage diabetes ?You may need to take insulin or other diabetes medicines daily to keep your  glucose in balance. If you are prescribed insulin, you will learn how to give yourself insulin by injection. You may need to adjust the amount of insulin you take based on the foods that you eat. ?You will need to check your blood glucose levels using a glucose monitor as told by your health care provider. The readings can help determine if you have low or high blood glucose. ?Generally, you should have these blood glucose levels: ?Before meals (preprandial): 80-130 mg/dL (4.4-7.2 mmol/L). ?After meals (postprandial): below 180 mg/dL (10 mmol/L). ?Hemoglobin A1c (HbA1c) level: less than 7%. ?Your health care provider will set treatment goals for you. ?Keep all follow-up visits. This is important. ?Follow these instructions at home: ?Diabetes medicines ?Take your diabetes medicines every day as told by your health care provider. List your diabetes medicines here: ?Name of medicine: ______________________________ ?Amount (dose): _______________ Time (a.m./p.m.): _______________ Notes: ___________________________________ ?Name of medicine: ______________________________ ?Amount (dose): _______________ Time (a.m./p.m.): _______________ Notes: ___________________________________ ?Name of medicine: ______________________________ ?Amount (dose): _______________ Time (a.m./p.m.): _______________ Notes: ___________________________________ ?Insulin ?If you use insulin, list the types of insulin you use here: ?Insulin type: ______________________________ ?Amount (dose): _______________ Time (a.m./p.m.): _______________Notes: ___________________________________ ?Insulin type: ______________________________ ?Amount (dose): _______________ Time (a.m./p.m.): _______________ Notes: ___________________________________ ?Insulin type: ______________________________ ?Amount (dose):  _______________ Time (a.m./p.m.): _______________ Notes: ___________________________________ ?Insulin type: ______________________________ ?Amount (dose):  _______________ Time (a.m./p.m.): _______________ Notes: ___________________________________ ?Insulin type: ______________________________ ?Amount (dose): _______________ Time (a.m./p.m.): _______________ Delane Ginger

## 2021-06-24 NOTE — Assessment & Plan Note (Signed)
Insomnia not well controlled.  Increase trazodone from 100 mg tablet at bedtime 250 mg tablet ? ?Follow-up with unresolved symptoms. ?

## 2021-06-24 NOTE — Assessment & Plan Note (Signed)
Back pain well controlled with Norco 3- 325 mg tablet by mouth every 6 hours as needed for pain.  Education provided to patient on the effects of long-term use of hydrocodone.  Patient may need pain management referral in the future for ongoing management. ?

## 2021-06-24 NOTE — Assessment & Plan Note (Signed)
Completed A1c; 6.4 ?No new signs and symptoms of worsening diabetes ?Continue diabetic diet and exercise. ? ? ?

## 2021-06-24 NOTE — Progress Notes (Signed)
? ?Established Patient Office Visit ? ?Subjective   ?Patient ID: [name], female    DOB: March 31, 1940  Age: 81 y.o. MRN: [id number] ? ?Chief Complaint  ?Patient presents with  ? Medical Management of Chronic Issues  ? ? ?HPI ? ?Pt presents for follow up of hypertension. Patient was diagnosed in 11/13/2012 The patient is tolerating the medication well without side effects. Compliance with treatment has been good; including taking medication as directed , maintains a healthy diet and regular exercise regimen , and following up as directed.  ? ? ?The patient presents with history of type 2 diabetes mellitus without complications. Patient was diagnosed in 12/2017. Compliance with treatment has been good; the patient takes medication as directed , maintains a diabetic diet and an exercise regimen , follows up as directed , and is keeping a glucose diary. Sugars runs 125-140. Patient specifically denies associated symptoms, including blurred vision, fatigue, polydipsia, polyphagia and polyuria . Patient denies hypoglycemia. In regard to preventative care, the patient performs foot self-exams daily and last ophthalmology exam was in patient to schedule.  ? ? ?Pain ? ?She reports chronic back pain pain. was not an injury that may have caused the pain. The pain started several months ago and is staying constant. The pain does radiate bilateral thighs. The pain is described as aching, is 6/10 in intensity, occurring constantly. Symptoms are worse in the: morning, mid-day, afternoon, evening  ?Aggravating factors: bending backwards, bending forwards, and walking Relieving factors: medication Norco 3-325 .  ?She has tried application of heat, application of ice, and prescription pain relievers with moderate relief.  ? ? ? ? ?Insomnia ? ?Insomnia not well controlled.  Patient is getting only 3 to 4 hours of sleep every night.  Patient is compliant with trazodone 100 mg tablet at hour of sleep with no adverse side  effects. ? ? ?Patient Active Problem List  ? Diagnosis Date Noted  ? 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 Hospital discharge follow-up 01/18/2021  ? Acute CHF (congestive heart failure) ([place]) 01/09/2021  ? 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 Persistent atrial fibrillation ([place]) 07/15/2020  ? Low back pain 03/17/2020  ? Non-ST elevation (NSTEMI) myocardial infarction [facility])   ? Unstable angina ([place]) 03/08/2020  ? Hypothyroidism 02/06/2020  ? Paroxysmal atrial fibrillation ([place]) 12/16/2019  ? Secondary hypercoagulable state ([place]) 12/16/2019  ? 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 Healthcare maintenance 10/02/2019  ? Anxiety with depression 10/02/2019  ? Numbness 04/02/2018  ? Numbness of tongue 04/02/2018  ? Neck pain on right side 04/02/2018  ? Diabetes mellitus without complication ([place]) 19/41/7408  ? Atrial fibrillation with rapid ventricular response ([place])   ? Chest pain in adult   ? Atrial fibrillation with RVR ([place]) 12/17/2017  ? Accelerating angina ([place])   ? Exertional angina ([place]) 02/23/2015  ? Chest pain 11/01/2012  ? Mixed hyperlipidemia 11/12/2009  ? Essential hypertension 11/12/2009  ? Coronary atherosclerosis of native coronary artery 09/25/2008  ? Pain of back and left lower extremity 02/19/2008  ? ?Past Medical History:  ?Diagnosis Date  ? Anxiety   ? Arthritis   ? Bulging lumbar disc   ? Chronic lower back pain   ? Coronary atherosclerosis of native coronary artery   ? DES LAD 02/2008, DES LAD 02/2015, DES ramus and DES x2 mid RCA 02/2020  ? Depression   ? Dyslipidemia   ? Essential hypertension   ? Facial numbness   ? Frequent headaches   ? GERD (gastroesophageal reflux  disease)   ? Glaucoma   ? Heart attack ([place])   ? Insomnia   ? Paroxysmal atrial fibrillation ([facility])   ? Diagnosed October 2019  ? PSVT (paroxysmal supraventricular tachycardia) ([place])   ? Refusal of blood transfusions as patient is Jehovah's Witness   ? Thyroid disease   ? Type 2 diabetes mellitus ([place])   ? Borderline  ? Vitamin D  deficiency   ? ?Past Surgical History:  ?Procedure Laterality Date  ? ATRIAL FIBRILLATION ABLATION N/A 02/16/2021  ? Procedure: ATRIAL FIBRILLATION ABLATION;  Surgeon: Thompson Grayer, MD;  Location: [facility];  Service: Cardiovascular;  Laterality: N/A;  ? CARDIAC CATHETERIZATION N/A 02/23/2015  ? Procedure: Left Heart Cath and Coronary Angiography;  Surgeon: Sherren Mocha, MD;  Location: [facility];  Service: Cardiovascular;  Laterality: N/A;  ? CARDIAC CATHETERIZATION N/A 02/23/2015  ? Procedure: Coronary Stent Intervention;  Surgeon: Sherren Mocha, MD;  Location: [facility];  Service: Cardiovascular;  Laterality: N/A;  ? CARDIAC CATHETERIZATION N/A 01/04/2016  ? Procedure: Left Heart Cath and Coronary Angiography;  Surgeon: Peter M Martinique, MD;  Location: [facility];  Service: Cardiovascular;  Laterality: N/A;  ? CATARACT EXTRACTION    ? CORONARY ANGIOPLASTY  02/23/2015  ? CORONARY ANGIOPLASTY WITH STENT PLACEMENT  2009  ? CORONARY STENT INTERVENTION N/A 03/09/2020  ? Procedure: CORONARY STENT INTERVENTION;  Surgeon: Jettie Booze, MD;  Location: [facility];  Service: Cardiovascular;  Laterality: N/A;  ? DILATION AND CURETTAGE OF UTERUS    ? implantable loop recorder placement  04/01/2020  ? Medtronic Reveal Linq model [place] 22 [id number]) implantable loop recorder   ? LEFT HEART CATH AND CORONARY ANGIOGRAPHY N/A 03/09/2020  ? Procedure: LEFT HEART CATH AND CORONARY ANGIOGRAPHY;  Surgeon: Jettie Booze, MD;  Location: [facility];  Service: Cardiovascular;  Laterality: N/A;  ? TUBAL LIGATION    ? ?Social History  ? ?Tobacco Use  ? Smoking status: Never  ? Smokeless tobacco: Never  ?Vaping Use  ? Vaping Use: Never used  ?Substance Use Topics  ? Alcohol use: Yes  ?  Alcohol/week: 1.0 standard drink  ?  Types: 1 Glasses of wine per week  ?  Comment: RARE OCCASION  ? Drug use: No  ? ?Social History  ? ?Socioeconomic History  ? Marital status: Widowed  ?  Spouse  name: Not on file  ? Number of children: 8  ? Years of education: 10th grade  ? Highest education level: 10th grade  ?Occupational History  ? Occupation: RETIRED  ?Tobacco Use  ? Smoking status: Never  ? Smokeless tobacco: Never  ?Vaping Use  ? Vaping Use: Never used  ?Substance and Sexual Activity  ? Alcohol use: Yes  ?  Alcohol/week: 1.0 standard drink  ?  Types: 1 Glasses of wine per week  ?  Comment: RARE OCCASION  ? Drug use: No  ? Sexual activity: Not Currently  ?Other Topics Concern  ? Not on file  ?Social History Narrative  ? Lives at home alone.  ? Right-handed.  ? No caffeine use.  ? Daughter lives next door and helps her out  ? ?Social Determinants of Health  ? ?Financial Resource Strain: Not on file  ?Food Insecurity: Not on file  ?Transportation Needs: Not on file  ?Physical Activity: Not on file  ?Stress: No Stress Concern Present  ? Feeling of Stress : Only a little  ?Social Connections: Not on file  ?Intimate  Partner Violence: Not on file  ? ?Family Status  ?Relation Name Status  ? Mother  Alive  ?     33  ? Father  Deceased  ? Sister  Deceased  ?     coronary artery disease  ? Brother  Deceased  ?     from myocardial infarction  ? Sister  Deceased  ?     coronary artery disease  ? Sister  Deceased  ? Sister  Deceased  ? Sister  Alive  ? Sister  Alive  ? Brother  Deceased  ? Brother  Deceased  ? Brother  Alive  ? Brother  Alive  ? ?Family History  ?Problem Relation Age of Onset  ? Other Mother   ?     "natural causes"  ? Heart disease Mother   ? Cancer Father   ?     unsure of type  ? ?No Known Allergies ?  ? ?Review of Systems  ?Constitutional: Negative.   ?HENT: Negative.    ?Eyes: Negative.   ?Respiratory: Negative.    ?Cardiovascular: Negative.   ?Genitourinary: Negative.   ?Musculoskeletal: Negative.   ?Skin: Negative.   ?Neurological: Negative.   ?Psychiatric/Behavioral: Negative.    ?All other systems reviewed and are negative. ? ?  ?Objective:  ?  ? ?BP (!) 171/68   Pulse 69   Temp (!) 97  ?F (36.1 ?C) (Temporal)   Ht '5\' 4"'$  (1.626 m)   Wt 163 lb 6.4 oz (74.1 kg)   LMP  (LMP Unknown)   SpO2 98%   BMI 28.05 kg/m?  ?BP Readings from Last 3 Encounters:  ?06/24/21 (!) 171/68  ?05/26/21 (!) 164/70  ?03/16/21 13

## 2021-06-25 LAB — MICROALBUMIN / CREATININE URINE RATIO
Creatinine, Urine: 39 mg/dL
Microalb/Creat Ratio: <8 mg/g creat (ref 0–29)
Microalbumin, Urine: <3 ug/mL

## 2021-07-04 NOTE — Progress Notes (Signed)
Carelink Summary Report / Loop Recorder 

## 2021-07-05 ENCOUNTER — Encounter: Payer: Self-pay | Admitting: Cardiology

## 2021-07-05 ENCOUNTER — Ambulatory Visit: Payer: Medicare Other | Admitting: Cardiology

## 2021-07-05 VITALS — BP 142/74 | HR 71 | Ht 64.0 in | Wt 162.4 lb

## 2021-07-05 DIAGNOSIS — I4819 Other persistent atrial fibrillation: Secondary | ICD-10-CM | POA: Diagnosis not present

## 2021-07-05 DIAGNOSIS — K219 Gastro-esophageal reflux disease without esophagitis: Secondary | ICD-10-CM

## 2021-07-05 DIAGNOSIS — I25119 Atherosclerotic heart disease of native coronary artery with unspecified angina pectoris: Secondary | ICD-10-CM

## 2021-07-05 DIAGNOSIS — R110 Nausea: Secondary | ICD-10-CM | POA: Diagnosis not present

## 2021-07-05 MED ORDER — DILTIAZEM HCL ER COATED BEADS 240 MG PO CP24: 240.0000 mg | ORAL_CAPSULE | Freq: Every day | ORAL | 1 refills | Status: DC

## 2021-07-05 NOTE — Patient Instructions (Addendum)
Medication Instructions:  ?Your physician has recommended you make the following change in your medication:  ?Start Cardizem 240 mg once a day ?Continue all other medications as directed  ? ?Labwork: ?none ? ?Testing/Procedures: ?none ? ?Follow-Up: ?Your physician recommends that you schedule a follow-up appointment in: 6 months ? ?Any Other Special Instructions Will Be Listed Below (If Applicable). ? ?You have been referred to gastroenterology ? ?If you need a refill on your cardiac medications before your next appointment, please call your pharmacy. ? ?

## 2021-07-05 NOTE — Progress Notes (Signed)
? ? ?Cardiology Office Note ? ?Date: 07/05/2021  ? ?ID: [name], DOB May 02, 1940, MRN [id number] ? ?PCP:  Ivy Lynn, NP  ?Cardiologist:  Rozann Lesches, MD ?Electrophysiologist:  Thompson Grayer, MD  ? ?Chief Complaint  ?Patient presents with  ? Cardiac follow-up  ? ? ?History of Present Illness: ?[name] is an 81 y.o. female last seen in November 2022.  She has had interval follow-up with Dr. Rayann Heman and also in the atrial fibrillation clinic, I reviewed these notes.  She reports somewhat more palpitations since her last visit with Dr. Rayann Heman.  I personally reviewed her ECG today which shows sinus rhythm with PACs. ? ?She is status post atrial fibrillation ablation in December 2022 by Dr. Rayann Heman.  Did have a postprocedure DVT, otherwise doing well in terms of rhythm control.  Cardizem CD was cut back to 180 mg at her last visit in April with Dr. Rayann Heman.  CHA2DS2-VASc score is 6 and she continues on Eliquis. ? ?She also mentions intermittent reflux and also recurring episodes of nausea.  She uses Tums over-the-counter, also has a prescription for as needed Phenergan.  She has not seen a GI specialist.  No obvious stool changes. ? ?I reviewed her most recent lab work as noted below. ? ?Past Medical History:  ?Diagnosis Date  ? Anxiety   ? Arthritis   ? Bulging lumbar disc   ? Chronic lower back pain   ? Coronary atherosclerosis of native coronary artery   ? DES LAD 02/2008, DES LAD 02/2015, DES ramus and DES x2 mid RCA 02/2020  ? Depression   ? Dyslipidemia   ? Essential hypertension   ? Facial numbness   ? Frequent headaches   ? GERD (gastroesophageal reflux disease)   ? Glaucoma   ? Heart attack ([place])   ? Insomnia   ? Paroxysmal atrial fibrillation ([facility])   ? Diagnosed October 2019  ? PSVT (paroxysmal supraventricular tachycardia) ([place])   ? Refusal of blood transfusions as patient is Jehovah's Witness   ? Thyroid disease   ? Type 2 diabetes mellitus ([place])   ? Borderline  ? Vitamin D deficiency    ? ? ?Past Surgical History:  ?Procedure Laterality Date  ? ATRIAL FIBRILLATION ABLATION N/A 02/16/2021  ? Procedure: ATRIAL FIBRILLATION ABLATION;  Surgeon: Thompson Grayer, MD;  Location: [facility];  Service: Cardiovascular;  Laterality: N/A;  ? CARDIAC CATHETERIZATION N/A 02/23/2015  ? Procedure: Left Heart Cath and Coronary Angiography;  Surgeon: Sherren Mocha, MD;  Location: [facility];  Service: Cardiovascular;  Laterality: N/A;  ? CARDIAC CATHETERIZATION N/A 02/23/2015  ? Procedure: Coronary Stent Intervention;  Surgeon: Sherren Mocha, MD;  Location: [place] [facility];  Service: Cardiovascular;  Laterality: N/A;  ? CARDIAC CATHETERIZATION N/A 01/04/2016  ? Procedure: Left Heart Cath and Coronary Angiography;  Surgeon: Peter M Martinique, MD;  Location: [facility];  Service: Cardiovascular;  Laterality: N/A;  ? CATARACT EXTRACTION    ? CORONARY ANGIOPLASTY  02/23/2015  ? CORONARY ANGIOPLASTY WITH STENT PLACEMENT  2009  ? CORONARY STENT INTERVENTION N/A 03/09/2020  ? Procedure: CORONARY STENT INTERVENTION;  Surgeon: Jettie Booze, MD;  Location: [facility];  Service: Cardiovascular;  Laterality: N/A;  ? DILATION AND CURETTAGE OF UTERUS    ? implantable loop recorder placement  04/01/2020  ? Medtronic Reveal Linq model [place] 22 [id number] G) implantable loop recorder   ? LEFT HEART CATH AND CORONARY ANGIOGRAPHY N/A 03/09/2020  ? Procedure:  LEFT HEART CATH AND CORONARY ANGIOGRAPHY;  Surgeon: Jettie Booze, MD;  Location: [facility];  Service: Cardiovascular;  Laterality: N/A;  ? TUBAL LIGATION    ? ? ?Current Outpatient Medications  ?Medication Sig Dispense Refill  ? apixaban (ELIQUIS) 5 MG TABS tablet TAKE ONE TABLET BY MOUTH TWICE DAILY 60 tablet 5  ? Ascorbic Acid (VITAMIN C) 500 MG tablet Take 500 mg by mouth daily.      ? bimatoprost (LUMIGAN) 0.01 % SOLN Place 1 drop into both eyes at bedtime.    ? Calcium Carbonate Antacid (TUMS PO) Take 2 tablets by mouth 4 (four)  times daily as needed (for acid reflux/indigestion).     ? Camphor-Menthol-Methyl Sal (SALONPAS) 3.02-26-08 % PTCH Apply 1 patch topically daily as needed (pain).    ? Cholecalciferol (VITAMIN D) 400 UNITS capsule Take 400 Units by mouth daily.      ? ciclopirox (PENLAC) 8 % solution Apply 1 application topically at bedtime.    ? Cyanocobalamin (VITAMIN B 12 PO) Take 1 tablet by mouth daily.    ? diclofenac Sodium (VOLTAREN) 1 % GEL Apply 2 g topically 4 (four) times daily. 50 g 2  ? furosemide (LASIX) 20 MG tablet Take 1 tablet (20 mg total) by mouth daily as needed for edema. 30 tablet   ? Garlic 845 MG TABS Take 1 tablet by mouth at bedtime.    ? HYDROcodone-acetaminophen (NORCO) 5-325 MG tablet Take 1 tablet by mouth every 6 (six) hours as needed for moderate pain. 120 tablet 0  ? HYDROcodone-acetaminophen (NORCO) 5-325 MG tablet Take 1 tablet by mouth every 6 (six) hours as needed for moderate pain. 30 tablet 0  ? [START ON 07/25/2021] HYDROcodone-acetaminophen (NORCO) 5-325 MG tablet Take 1 tablet by mouth every 6 (six) hours as needed for moderate pain. 30 tablet 0  ? [START ON 08/24/2021] HYDROcodone-acetaminophen (NORCO) 5-325 MG tablet Take 1 tablet by mouth every 6 (six) hours as needed for moderate pain. 30 tablet 0  ? levothyroxine (SYNTHROID) 50 MCG tablet Take 1 tablet (50 mcg total) by mouth daily before breakfast. 90 tablet 3  ? losartan (COZAAR) 50 MG tablet Take 1 tablet (50 mg total) by mouth daily. 90 tablet 3  ? magic mouthwash (nystatin, lidocaine, diphenhydrAMINE) suspension Take 5 mLs by mouth 4 (four) times daily as needed for mouth pain. 180 mL 2  ? MAGNESIUM CARBONATE PO Take 1 tablet by mouth daily. 400 mg    ? metFORMIN (GLUCOPHAGE-XR) 500 MG 24 hr tablet Take 1 tablet (500 mg total) by mouth daily with breakfast. 30 tablet 2  ? metoprolol succinate (TOPROL XL) 100 MG 24 hr tablet Take 1 tablet (100 mg total) by mouth daily. Take with or immediately following a meal. 30 tablet 11  ?  nitroGLYCERIN (NITROSTAT) 0.4 MG SL tablet Place 0.4 mg under the tongue every 5 (five) minutes as needed for chest pain.    ? promethazine (PHENERGAN) 25 MG tablet TAKE 1 TABLET EVERY 12 HOURS AS NEEDED FOR NAUSEA 60 tablet 0  ? rosuvastatin (CRESTOR) 20 MG tablet TAKE ONE (1) TABLET BY MOUTH EVERY DAY 90 tablet 3  ? timolol (TIMOPTIC) 0.5 % ophthalmic solution Place 1 drop into both eyes daily.    ? traZODone (DESYREL) 150 MG tablet Take 1 tablet (150 mg total) by mouth at bedtime. 90 tablet 1  ? TURMERIC PO Take 1 tablet by mouth daily.    ? VENTOLIN HFA 108 (90 Base) MCG/ACT inhaler  Inhale 1-2 puffs into the lungs every 4 (four) hours as needed for shortness of breath. 18 g 1  ? diltiazem (CARDIZEM CD) 240 MG 24 hr capsule Take 1 capsule (240 mg total) by mouth daily. 90 capsule 1  ? ?No current facility-administered medications for this visit.  ? ?Allergies:  Patient has no known allergies.  ? ?ROS: No syncope. ? ?Physical Exam: ?VS:  BP (!) 142/74   Pulse 71   Ht '5\' 4"'$  (1.626 m)   Wt 162 lb 6.4 oz (73.7 kg)   LMP  (LMP Unknown)   SpO2 95%   BMI 27.88 kg/m? , BMI Body mass index is 27.88 kg/m?. ? ?Wt Readings from Last 3 Encounters:  ?07/05/21 162 lb 6.4 oz (73.7 kg)  ?06/24/21 163 lb 6.4 oz (74.1 kg)  ?05/26/21 164 lb 6.4 oz (74.6 kg)  ?  ?General: Patient appears comfortable at rest. ?HEENT: Conjunctiva and lids normal. ?Neck: Supple, no elevated JVP or carotid bruits, no thyromegaly. ?Lungs: Clear to auscultation, nonlabored breathing at rest. ?Cardiac: Regular rate and rhythm with ectopy, no S3, 1/6 systolic murmur, no pericardial rub. ?Extremities: No pitting edema. ? ?ECG:  An ECG dated 05/25/2021 was personally reviewed today and demonstrated:  Sinus rhythm with prolonged PR interval and PAC, nonspecific ST changes. ? ?Recent Labwork: ?01/09/2021: B Natriuretic Peptide 1,296.0 ?02/07/2021: TSH 0.232 ?04/15/2021: ALT 10; AST 16; BUN 6; Creatinine, Ser 0.87; Hemoglobin 12.6; Platelets 211; Potassium  4.2; Sodium 142  ?   ?Component Value Date/Time  ? CHOL 109 04/15/2021 0935  ? TRIG 103 04/15/2021 0935  ? HDL 46 04/15/2021 0935  ? CHOLHDL 2.4 04/15/2021 0935  ? CHOLHDL 2.4 03/09/2020 0209  ? VLDL 12 03/09/2020 0209  ? LDLCA

## 2021-07-06 ENCOUNTER — Encounter: Payer: Self-pay | Admitting: Internal Medicine

## 2021-07-25 ENCOUNTER — Ambulatory Visit (INDEPENDENT_AMBULATORY_CARE_PROVIDER_SITE_OTHER): Payer: Medicare Other

## 2021-07-25 DIAGNOSIS — I4819 Other persistent atrial fibrillation: Secondary | ICD-10-CM

## 2021-07-26 ENCOUNTER — Other Ambulatory Visit (HOSPITAL_COMMUNITY)
Admission: RE | Admit: 2021-07-26 | Discharge: 2021-07-26 | Disposition: A | Discharge status: Home / Self Care | Payer: Medicare Other | Source: Ambulatory Visit | Attending: Nurse Practitioner | Admitting: Nurse Practitioner

## 2021-07-26 ENCOUNTER — Ambulatory Visit (HOSPITAL_COMMUNITY)
Admission: RE | Admit: 2021-07-26 | Discharge: 2021-07-26 | Disposition: A | Discharge status: Home / Self Care | Payer: Medicare Other | Source: Ambulatory Visit | Attending: Nurse Practitioner | Admitting: Nurse Practitioner

## 2021-07-26 DIAGNOSIS — E042 Nontoxic multinodular goiter: Secondary | ICD-10-CM | POA: Insufficient documentation

## 2021-07-26 DIAGNOSIS — E032 Hypothyroidism due to medicaments and other exogenous substances: Secondary | ICD-10-CM | POA: Diagnosis not present

## 2021-07-26 LAB — CUP PACEART REMOTE DEVICE CHECK
Date Time Interrogation Session: 20230606093955
Implantable Pulse Generator Implant Date: 20220210

## 2021-07-26 LAB — TSH: TSH: 0.529 u[IU]/mL (ref 0.350–4.500)

## 2021-07-26 LAB — T4, FREE: Free T4: 1.06 ng/dL (ref 0.61–1.12)

## 2021-08-09 ENCOUNTER — Encounter: Payer: Self-pay | Admitting: Nurse Practitioner

## 2021-08-09 ENCOUNTER — Ambulatory Visit: Payer: Medicare Other | Admitting: Nurse Practitioner

## 2021-08-09 VITALS — BP 138/74 | HR 68 | Ht 64.0 in | Wt 162.0 lb

## 2021-08-09 DIAGNOSIS — E042 Nontoxic multinodular goiter: Secondary | ICD-10-CM

## 2021-08-09 DIAGNOSIS — E032 Hypothyroidism due to medicaments and other exogenous substances: Secondary | ICD-10-CM

## 2021-08-09 MED ORDER — LEVOTHYROXINE SODIUM 50 MCG PO TABS: 50.0000 ug | ORAL_TABLET | Freq: Every day | ORAL | 3 refills | Status: DC

## 2021-08-09 NOTE — Progress Notes (Signed)
08/09/2021, 10:43 AM                          Endocrinology Follow Up Visit   Subjective:    Patient ID: [name], female    DOB: Jun 23, 1940, PCP Ivy Lynn, NP   Past Medical History:  Diagnosis Date   Anxiety    Arthritis    Bulging lumbar disc    Chronic lower back pain    Coronary atherosclerosis of native coronary artery    DES LAD 02/2008, DES LAD 02/2015, DES ramus and DES x2 mid RCA 02/2020   Depression    Dyslipidemia    Essential hypertension    Facial numbness    Frequent headaches    GERD (gastroesophageal reflux disease)    Glaucoma    Heart attack ([place])    Insomnia    Paroxysmal atrial fibrillation ([place])    Diagnosed October 2019   PSVT (paroxysmal supraventricular tachycardia) ([place])    Refusal of blood transfusions as patient is Jehovah's Witness    Thyroid disease    Type 2 diabetes mellitus ([place])    Borderline   Vitamin D deficiency    Past Surgical History:  Procedure Laterality Date   ATRIAL FIBRILLATION ABLATION N/A 02/16/2021   Procedure: ATRIAL FIBRILLATION ABLATION;  Surgeon: Thompson Grayer, MD;  Location: [facility];  Service: Cardiovascular;  Laterality: N/A;   CARDIAC CATHETERIZATION N/A 02/23/2015   Procedure: Left Heart Cath and Coronary Angiography;  Surgeon: Sherren Mocha, MD;  Location: [facility];  Service: Cardiovascular;  Laterality: N/A;   CARDIAC CATHETERIZATION N/A 02/23/2015   Procedure: Coronary Stent Intervention;  Surgeon: Sherren Mocha, MD;  Location: [facility];  Service: Cardiovascular;  Laterality: N/A;   CARDIAC CATHETERIZATION N/A 01/04/2016   Procedure: Left Heart Cath and Coronary Angiography;  Surgeon: Peter M Martinique, MD;  Location: [facility];  Service: Cardiovascular;  Laterality: N/A;   CATARACT EXTRACTION     CORONARY ANGIOPLASTY  02/23/2015   CORONARY ANGIOPLASTY WITH STENT PLACEMENT  2009   CORONARY STENT INTERVENTION N/A  03/09/2020   Procedure: CORONARY STENT INTERVENTION;  Surgeon: Jettie Booze, MD;  Location: [facility];  Service: Cardiovascular;  Laterality: N/A;   DILATION AND CURETTAGE OF UTERUS     implantable loop recorder placement  04/01/2020   Medtronic Reveal Linq model LNQ 22 [id number]) implantable loop recorder    LEFT HEART CATH AND CORONARY ANGIOGRAPHY N/A 03/09/2020   Procedure: LEFT HEART CATH AND CORONARY ANGIOGRAPHY;  Surgeon: Jettie Booze, MD;  Location: [facility];  Service: Cardiovascular;  Laterality: N/A;   TUBAL LIGATION     Social History   Socioeconomic History   Marital status: Widowed    Spouse name: Not on file   Number of children: 8   Years of education: 10th grade   Highest education level: 10th grade  Occupational History   Occupation: RETIRED  Tobacco Use   Smoking status: Never   Smokeless tobacco: Never  Vaping Use   Vaping Use: Never used  Substance and Sexual Activity   Alcohol use: Yes    Alcohol/week: 1.0 standard drink of alcohol  Types: 1 Glasses of wine per week    Comment: RARE OCCASION   Drug use: No   Sexual activity: Not Currently  Other Topics Concern   Not on file  Social History Narrative   Lives at home alone.   Right-handed.   No caffeine use.   Daughter lives next door and helps her out   Social Determinants of Health   Financial Resource Strain: Low Risk  (02/26/2020)   Overall Financial Resource Strain (CARDIA)    Difficulty of Paying Living Expenses: Not hard at all  Food Insecurity: No Food Insecurity (02/26/2020)   Hunger Vital Sign    Worried About Running Out of Food in the Last Year: Never true    Ran Out of Food in the Last Year: Never true  Transportation Needs: No Transportation Needs (02/26/2020)   PRAPARE - Hydrologist (Medical): No    Lack of Transportation (Non-Medical): No  Physical Activity: Inactive (02/26/2020)   Exercise Vital Sign    Days of Exercise  per Week: 0 days    Minutes of Exercise per Session: 0 min  Stress: No Stress Concern Present (02/28/2021)   [place]    Feeling of Stress : Only a little  Social Connections: Moderately Integrated (02/26/2020)   Social Connection and Isolation Panel [NHANES]    Frequency of Communication with Friends and Family: More than three times a week    Frequency of Social Gatherings with Friends and Family: More than three times a week    Attends Religious Services: More than 4 times per year    Active Member of [employer] or Organizations: Yes    Attends Archivist Meetings: More than 4 times per year    Marital Status: Widowed   Family History  Problem Relation Age of Onset   Other Mother        "natural causes"   Heart disease Mother    Cancer Father        unsure of type   Outpatient Encounter Medications as of 08/09/2021  Medication Sig   apixaban (ELIQUIS) 5 MG TABS tablet TAKE ONE TABLET BY MOUTH TWICE DAILY   Ascorbic Acid (VITAMIN C) 500 MG tablet Take 500 mg by mouth daily.     bimatoprost (LUMIGAN) 0.01 % SOLN Place 1 drop into both eyes at bedtime.   Calcium Carbonate Antacid (TUMS PO) Take 2 tablets by mouth 4 (four) times daily as needed (for acid reflux/indigestion).    Camphor-Menthol-Methyl Sal (SALONPAS) 3.02-26-08 % PTCH Apply 1 patch topically daily as needed (pain).   Cholecalciferol (VITAMIN D) 400 UNITS capsule Take 400 Units by mouth daily.     ciclopirox (PENLAC) 8 % solution Apply 1 application topically at bedtime.   Cyanocobalamin (VITAMIN B 12 PO) Take 1 tablet by mouth daily.   diclofenac Sodium (VOLTAREN) 1 % GEL Apply 2 g topically 4 (four) times daily.   diltiazem (CARDIZEM CD) 240 MG 24 hr capsule Take 1 capsule (240 mg total) by mouth daily.   furosemide (LASIX) 20 MG tablet Take 1 tablet (20 mg total) by mouth daily as needed for edema.   Garlic 329 MG TABS Take 1 tablet by mouth at  bedtime.   [START ON 08/24/2021] HYDROcodone-acetaminophen (NORCO) 5-325 MG tablet Take 1 tablet by mouth every 6 (six) hours as needed for moderate pain.   levothyroxine (SYNTHROID) 50 MCG tablet Take 1 tablet (50 mcg total) by mouth  daily before breakfast.   losartan (COZAAR) 50 MG tablet Take 1 tablet (50 mg total) by mouth daily.   magic mouthwash (nystatin, lidocaine, diphenhydrAMINE) suspension Take 5 mLs by mouth 4 (four) times daily as needed for mouth pain.   MAGNESIUM CARBONATE PO Take 1 tablet by mouth daily. 400 mg   metFORMIN (GLUCOPHAGE-XR) 500 MG 24 hr tablet Take 1 tablet (500 mg total) by mouth daily with breakfast.   metoprolol succinate (TOPROL XL) 100 MG 24 hr tablet Take 1 tablet (100 mg total) by mouth daily. Take with or immediately following a meal.   nitroGLYCERIN (NITROSTAT) 0.4 MG SL tablet Place 0.4 mg under the tongue every 5 (five) minutes as needed for chest pain.   promethazine (PHENERGAN) 25 MG tablet TAKE 1 TABLET EVERY 12 HOURS AS NEEDED FOR NAUSEA   rosuvastatin (CRESTOR) 20 MG tablet TAKE ONE (1) TABLET BY MOUTH EVERY DAY   timolol (TIMOPTIC) 0.5 % ophthalmic solution Place 1 drop into both eyes daily.   traZODone (DESYREL) 150 MG tablet Take 1 tablet (150 mg total) by mouth at bedtime.   TURMERIC PO Take 1 tablet by mouth daily.   VENTOLIN HFA 108 (90 Base) MCG/ACT inhaler Inhale 1-2 puffs into the lungs every 4 (four) hours as needed for shortness of breath.   [DISCONTINUED] HYDROcodone-acetaminophen (NORCO) 5-325 MG tablet Take 1 tablet by mouth every 6 (six) hours as needed for moderate pain.   [DISCONTINUED] HYDROcodone-acetaminophen (NORCO) 5-325 MG tablet Take 1 tablet by mouth every 6 (six) hours as needed for moderate pain.   [DISCONTINUED] HYDROcodone-acetaminophen (NORCO) 5-325 MG tablet Take 1 tablet by mouth every 6 (six) hours as needed for moderate pain.   [DISCONTINUED] levothyroxine (SYNTHROID) 50 MCG tablet Take 1 tablet (50 mcg total) by mouth  daily before breakfast.   No facility-administered encounter medications on file as of 08/09/2021.   ALLERGIES: No Known Allergies  VACCINATION STATUS: Immunization History  Administered Date(s) Administered   Fluad Quad(high Dose 65+) 11/20/2019, 12/03/2020   Influenza Split 12/05/2005, 12/03/2006   Influenza, High Dose Seasonal PF 12/25/2016, 01/07/2018   Influenza,inj,quad, With Preservative 12/03/2015   Influenza-Unspecified 01/30/1995, 12/13/2000, 03/16/2003, 01/21/2015, 12/25/2016, 01/07/2018, 11/21/2018   Moderna Covid-19 Vaccine Bivalent Booster 38yr & up 01/18/2021   Moderna SARS-COV2 Booster Vaccination 12/31/2019, 07/04/2020   Moderna Sars-Covid-2 Vaccination 04/01/2019, 04/29/2019   Pneumococcal Polysaccharide-23 01/30/1995, 12/05/2005, 03/11/2020   Pneumococcal-Unspecified 12/24/2018   Tdap 12/24/2018   Zoster Recombinat (Shingrix) 03/04/2021, 06/24/2021    Thyroid Problem Presents for follow-up visit. Symptoms include fatigue and palpitations (having cardiac ablation soon). Patient reports no anxiety, cold intolerance, constipation, depressed mood, heat intolerance, tremors, weight gain or weight loss. The symptoms have been stable.    Julie NEVILSis 81y.o. female who is being engaged in follow-up after she was seen in consultation for hypothyroidism.    PCP:  IIvy Lynn NP.   Patient is assisted by her daughter during visit.   Her labs confirm primary hypothyroidism. Of note, she was treated with amiodarone for almost a year due to cardiac dysrhythmia .  Patient denies any family history of thyroid dysfunction, except for her sister who had nodules on her thyroid requiring biopsy which were benign.  She also had suspicious nodules on her thyroid requiring biopsy.  All came back benign, [facility]  She reports weight gain, fatigue, cold intolerance, constipation.   Review of systems  Constitutional:  + stable body  weight,  current Body mass index is 27.81 kg/m. , + fatigue, no subjective hyperthermia, no subjective hypothermia Eyes: no blurry vision, no xerophthalmia ENT: no sore throat, no nodules palpated in throat, no dysphagia/odynophagia, no hoarseness, metallic taste in mouth Cardiovascular: no chest pain, no shortness of breath, + intermittent palpitations-improved, no leg swelling Respiratory: no cough, no shortness of breath Gastrointestinal: no nausea/vomiting/diarrhea, Musculoskeletal: no muscle/joint aches Skin: no rashes, no hyperemia Neurological: no tremors, no numbness, no tingling, no dizziness Psychiatric: no depression, no anxiety  Objective:    BP 138/74   Pulse 68   Ht '5\' 4"'$  (1.626 m)   Wt 162 lb (73.5 kg)   LMP  (LMP Unknown)   BMI 27.81 kg/m   Wt Readings from Last 3 Encounters:  08/09/21 162 lb (73.5 kg)  07/05/21 162 lb 6.4 oz (73.7 kg)  06/24/21 163 lb 6.4 oz (74.1 kg)    BP Readings from Last 3 Encounters:  08/09/21 138/74  07/05/21 (!) 142/74  06/24/21 (!) 171/68     Physical Exam- Limited  Constitutional:  Body mass index is 27.81 kg/m. , not in acute distress, normal state of mind Eyes:  EOMI, no exophthalmos Neck: Supple Cardiovascular: RRR, no murmurs, rubs, or gallops, no edema Respiratory: Adequate breathing efforts, no crackles, rales, rhonchi, or wheezing Musculoskeletal: no gross deformities, strength intact in all four extremities, no gross restriction of joint movements Skin:  no rashes, no hyperemia Neurological: no tremor with outstretched hands    CMP ( most recent) CMP     Component Value Date/Time   NA 142 04/15/2021 0935   K 4.2 04/15/2021 0935   CL 103 04/15/2021 0935   CO2 26 04/15/2021 0935   GLUCOSE 149 (H) 04/15/2021 0935   GLUCOSE 120 (H) 01/12/2021 0424   BUN 6 (L) 04/15/2021 0935   CREATININE 0.87 04/15/2021 0935   CALCIUM 9.8 04/15/2021 0935   PROT 6.5 04/15/2021 0935   ALBUMIN 4.3 04/15/2021 0935    AST 16 04/15/2021 0935   ALT 10 04/15/2021 0935   ALKPHOS 98 04/15/2021 0935   BILITOT 0.3 04/15/2021 0935   GFRNONAA >60 01/12/2021 0424   GFRAA >60 10/21/2019 1347     Diabetic Labs (most recent): Lab Results  Component Value Date   HGBA1C 6.4 (H) 06/24/2021   HGBA1C 6.6 (H) 01/10/2021   HGBA1C 6.5 (H) 12/03/2020     Lipid Panel ( most recent) Lipid Panel     Component Value Date/Time   CHOL 109 04/15/2021 0935   TRIG 103 04/15/2021 0935   HDL 46 04/15/2021 0935   CHOLHDL 2.4 04/15/2021 0935   CHOLHDL 2.4 03/09/2020 0209   VLDL 12 03/09/2020 0209   LDLCALC 44 04/15/2021 0935   LABVLDL 19 04/15/2021 0935      Lab Results  Component Value Date   TSH 0.529 07/26/2021   TSH 0.232 (L) 02/07/2021   TSH 1.995 01/10/2021   TSH 0.552 08/27/2020   TSH 0.745 08/06/2020   TSH 6.335 (H) 05/05/2020   TSH 10.867 (H) 03/03/2020   TSH 20.523 (H) 02/03/2020   TSH 16.400 (H) 01/30/2020   TSH 11.769 (H) 11/11/2019   FREET4 1.06 07/26/2021   FREET4 1.37 (H) 02/07/2021   FREET4 1.26 (H) 08/06/2020   FREET4 0.95 05/05/2020   FREET4 0.65 03/03/2020   FREET4 0.44 (L) 02/03/2020     Thyroid US on 04/29/20 CLINICAL DATA:  Hyperthyroidism   EXAM: THYROID ULTRASOUND   TECHNIQUE: Ultrasound examination of the thyroid gland and adjacent  soft tissues was performed.   COMPARISON:  None.   FINDINGS: Parenchymal Echotexture: Mildly heterogeneous   Isthmus: 0.7 cm   Right lobe: 6.2 x 2.6 x 2.2 cm   Left lobe: 5.0 x 2.1 x 2.2 cm   _________________________________________________________   Estimated total number of nodules >/= 1 cm: 7   Number of spongiform nodules >/=  2 cm not described below (TR1): 0   Number of mixed cystic and solid nodules >/= 1.5 cm not described below (TR2): 0   _________________________________________________________   Nodule # 1:   Location: Right; superior   Maximum size: 1.3 cm; Other 2 dimensions: 1.3 x 1.0 cm   Composition:  solid/almost completely solid (2)   Echogenicity: hypoechoic (2)   Shape: taller-than-wide (3)   Margins: smooth (0)   Echogenic foci: none (0)   ACR TI-RADS total points: 7.   ACR TI-RADS risk category: TR5 (>/= 7 points).   ACR TI-RADS recommendations:   **Given size (>/= 1.0 cm) and appearance, fine needle aspiration of this highly suspicious nodule should be considered based on TI-RADS criteria.   _________________________________________________________   Nodule # 2:   Location: Right; superior   Maximum size: 2.2 cm; Other 2 dimensions: 1.7 x 1.7 cm   Composition: solid/almost completely solid (2)   Echogenicity: isoechoic (1)   Shape: taller-than-wide (3)   Margins: smooth (0)   Echogenic foci: none (0)   ACR TI-RADS total points: 4.   ACR TI-RADS risk category: TR4 (4-6 points).   ACR TI-RADS recommendations:   **Given size (>/= 1.5 cm) and appearance, fine needle aspiration of this moderately suspicious nodule should be considered based on TI-RADS criteria.   _________________________________________________________   Nodule # 3:   Location: Right; Mid   Maximum size: 2.3 cm; Other 2 dimensions: 1.7 x 2.2 cm   Composition: solid/almost completely solid (2)   Echogenicity: hypoechoic (2)   Shape: not taller-than-wide (0)   Margins: smooth (0)   Echogenic foci: none (0)   ACR TI-RADS total points: 4.   ACR TI-RADS risk category: TR4 (4-6 points).   ACR TI-RADS recommendations:   **Given size (>/= 1.5 cm) and appearance, fine needle aspiration of this moderately suspicious nodule should be considered based on TI-RADS criteria.   _________________________________________________________   Nodule # 4:   Location: Right; Inferior   Maximum size: 1.6 cm; Other 2 dimensions: 1.0 x 1.2 cm   Composition: mixed cystic and solid (1)   Echogenicity: isoechoic (1)   Shape: not taller-than-wide (0)   Margins: smooth (0)   Echogenic  foci: none (0)   ACR TI-RADS total points: 2.   ACR TI-RADS risk category: TR2 (2 points).   ACR TI-RADS recommendations:   This nodule does NOT meet TI-RADS criteria for biopsy or dedicated follow-up.   _________________________________________________________   Nodule # 5:   Location: Left; superior   Maximum size: 1.8 cm; Other 2 dimensions: 1.3 x 1.8 cm   Composition: solid/almost completely solid (2)   Echogenicity: isoechoic (1)   Shape: not taller-than-wide (0)   Margins: smooth (0)   Echogenic foci: none (0)   ACR TI-RADS total points: 3.   ACR TI-RADS risk category: TR3 (3 points).   ACR TI-RADS recommendations:   *Given size (>/= 1.5 - 2.4 cm) and appearance, a follow-up ultrasound in 1 year should be considered based on TI-RADS criteria.   _________________________________________________________   Nodule # 6:   Location: Left; mid   Maximum size: 1.9 cm; Other 2  dimensions: 1.7 x 1.8 cm   Composition: solid/almost completely solid (2)   Echogenicity: isoechoic (1)   Shape: taller-than-wide (3)   Margins: ill-defined (0)   Echogenic foci: none (0)   ACR TI-RADS total points: 6.   ACR TI-RADS risk category: TR4 (4-6 points).   ACR TI-RADS recommendations:   **Given size (>/= 1.5 cm) and appearance, fine needle aspiration of this moderately suspicious nodule should be considered based on TI-RADS criteria.   _________________________________________________________   Nodule # 7: 1.1 cm cystic nodule in the inferior left thyroid lobe does not meet criteria for FNA or imaging follow-up.   IMPRESSION: 1. Nodule 1 (TI-RADS 5) located in the superior right thyroid lobe, measuring 1.3 x 1.3 x 1.0 cm, meets criteria for FNA. 2. Nodule 2 (TI-RADS 4), located in the superior right thyroid lobe, measuring 2.2 x 1.7 x 1.7 cm, meets criteria for FNA. 3. Nodule 3 (TI-RADS 4) located in the mid right thyroid lobe, measuring 2.3 x 1.7 x 2.2 cm,  meets criteria for FNA. 4. Nodule 5 (TI-RADS 3) located in the superior left thyroid lobe, measuring 1.8 x 1.3 x 1.8 cm, meets criteria for imaging follow-up. Next ultrasound should be performed in 1 year. 5. Nodule 6 (TI-RADS 4), located in the mid left thyroid lobe, measuring 1.9 x 1.7 x 1.8 cm, meets criteria for FNA. 6. Order of suspicion of nodules for consideration for FNA: 1, 3, 2, 6   The above is in keeping with the ACR TI-RADS recommendations - J Am Coll Radiol 2017;14:587-595.     Electronically Signed   By: Miachel Roux M.D.   On: 04/29/2020 16:12 -------------------------------------------------------------------------------------------------------------------------- Cytology - Non PAP  CASE: [id number]  PATIENT: [name]  Non-Gynecological Cytology Report    FNA Thyroid Nodule Biopsy  Clinical History: 2.3 cm RML  Specimen Submitted:  A. THYROID, RML, FINE NEEDLE ASPIRATION:    FINAL MICROSCOPIC DIAGNOSIS:  - Atypia of undetermined significance (Bethesda category III)   SPECIMEN ADEQUACY:  Satisfactory for evaluation   DIAGNOSTIC COMMENTS:  This specimen will be sent for Afirma testing.   GROSS:  Received is/are 3 slides in 95% Ethyl alcohol, 3 air dried slides for  diff stain, and 30 ccs of pale pink Cytolyt solution. (CM:cm)  Prepared:  Smears:  6  Concentration Method (Thin Prep):  1  Cell Block:  Cell block attempted, not obtained.  Additional Studies:  Also there was an Afirma collected. For RUL Thyroid  FNA, see [id number].   CYTOLOGY - NON PAP  CASE: [id number]  PATIENT: [name]  Non-Gynecological Cytology Report          FNA Thyroid Nodule Biopsy  Clinical History: 1.3 cm RUL  Specimen Submitted:  A. THYROID, RUL, FINE NEEDLE ASPIRATION:    FINAL MICROSCOPIC DIAGNOSIS:  - Consistent with benign follicular nodule (Bethesda category II)   SPECIMEN ADEQUACY:  Satisfactory for evaluation   GROSS:  Received is/are  4 slides in 95% Ethyl alcohol, 4 air dried slides for  diff stain, and 30 ccs of pale pink Cytolyt solution. (CM:cm)  Prepared:  Smears:  8  Concentration Method (Thin Prep):  1  Cell Block:  Cell block attempted, not obtained.  Additional Studies:  Also there was an Afirma collected. For RML Thyroid  FNA, see [id number].     Latest Reference Range & Units 08/27/20 15:18 01/10/21 13:51 02/07/21 15:45 07/26/21 11:34 07/26/21 11:36  TSH 0.350 - 4.500 uIU/mL 0.552 1.995 0.232 (L) 0.529   T4,Free(Direct) 0.61 -  1.12 ng/dL   1.37 (H)  1.06  Thyroxine (T4) 4.5 - 12.0 ug/dL 10.2      Free Thyroxine Index 1.2 - 4.9  2.8      T3 Uptake Ratio 24 - 39 % 27      (L): Data is abnormally low (H): Data is abnormally high   Repeat thyroid US from 07/26/21 CLINICAL DATA:  Goiter. Multifocal thyroid ultrasound. Patient previously underwent biopsy of right superior and right mid thyroid nodules in March of 2022   EXAM: THYROID ULTRASOUND   TECHNIQUE: Ultrasound examination of the thyroid gland and adjacent soft tissues was performed.   COMPARISON:  Prior thyroid ultrasound 04/29/2020   FINDINGS: Parenchymal Echotexture: Markedly heterogenous   Isthmus: 0.5 cm   Right lobe: 4.5 x 2.1 x 2.4 cm   Left lobe: 4.5 x 2.0 x 2.1 cm   _________________________________________________________   Estimated total number of nodules >/= 1 cm: 5   Number of spongiform nodules >/=  2 cm not described below (TR1): 0   Number of mixed cystic and solid nodules >/= 1.5 cm not described below (TR2): 0   _________________________________________________________   The thyroid gland is diffusely heterogeneous, enlarged and nodular. Many of the areas of "nodularity "are inseparable from the background thyroid parenchyma and most likely to represent areas of pseudo nodularity.   Nodule # 1: The previously biopsied nodule/cluster of nodules in the right superior gland demonstrates no significant interval  change compared to prior imaging obtained at the time of biopsy.   Nodule # 3: The previously biopsied nodule with central dystrophic calcifications in the right mid to lower gland measures 1.9 x 1.7 x 1.2 cm; slightly smaller than 2.3 by 1.7 x 2.2 cm previously.   Additional measured areas either represent benign-appearing spongiform nodules, or vague regions of pseudo nodularity. No other discrete nodules identified that would warrant further evaluation.   IMPRESSION: Overall, unchanged appearance of the thyroid gland which is diffusely enlarged, heterogeneous and lobular with multiple regions of pseudonodularity.   The previously biopsied nodules in the right upper and right mid gland demonstrate no significant interval change.   No additional foci of definitive nodularity that would warrant biopsy or dedicated imaging surveillance.     Electronically Signed   By: Jacqulynn Cadet M.D.   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  She is advised to continue her dose of Levothyroxine 50 mcg po daily before breakfast.    - We discussed about the correct intake of her thyroid hormone, on empty stomach at fasting, with water, separated by at least 30 minutes from breakfast and other medications,  and separated by more than 4 hours from calcium, iron, multivitamins, acid reflux medications (PPIs). -Patient is made aware of the fact that thyroid hormone replacement is needed for life, dose to be adjusted by periodic monitoring of thyroid function tests.   2. Multinodular Goiter -Her thyroid ultrasound shows multinodular goiter with 4 moderately suspicious nodules that meet criteria for FNA and 1 nodule recommending follow up with ultrasound in 1 year.   All biopsies were confirmed to be benign.   Her follow up ultrasound shows stable MNG, no  further surveillance is needed at this time.    - she is advised to maintain close follow up with Ivy Lynn, NP for primary care needs.        I spent 30  minutes in the care of the patient today including review of labs from Thyroid Function, CMP, and other relevant labs ; imaging/biopsy records (current and previous including abstractions from other facilities); face-to-face time discussing  her lab results and symptoms, medications doses, her options of short and long term treatment based on the latest standards of care / guidelines;   and documenting the encounter.  [name]  participated in the discussions, expressed understanding, and voiced agreement with the above plans.  All questions were answered to her satisfaction. she is encouraged to contact clinic should she have any questions or concerns prior to her return visit.   Follow up plan: Return in about 1 year (around 08/10/2022) for Thyroid follow up, Previsit labs.   Rayetta Pigg, [facility] [address] [place], [place] [address] Phone: [phone] Fax: [phone]   08/09/2021, 10:43 AM

## 2021-08-09 NOTE — Patient Instructions (Signed)

## 2021-08-10 NOTE — Progress Notes (Signed)
Carelink Summary Report / Loop Recorder 

## 2021-08-18 ENCOUNTER — Ambulatory Visit: Payer: Medicare Other | Admitting: Gastroenterology

## 2021-08-18 ENCOUNTER — Encounter: Payer: Self-pay | Admitting: Gastroenterology

## 2021-08-18 ENCOUNTER — Telehealth: Payer: Self-pay | Admitting: *Deleted

## 2021-08-18 DIAGNOSIS — R1013 Epigastric pain: Secondary | ICD-10-CM

## 2021-08-18 MED ORDER — PANTOPRAZOLE SODIUM 40 MG PO TBEC: 40.0000 mg | DELAYED_RELEASE_TABLET | Freq: Every day | ORAL | 3 refills | Status: DC

## 2021-08-18 NOTE — Telephone Encounter (Signed)
Called pt. Scheduled for EGD with Dr. Gala Romney, ASA 3 ON 8/2 AT 1pm. Aware will mail instructions/pre-op appt./ hold eliquis 48 hrs prior.   PA approved. Auth# [id number], DOS: Sep 21, 2021 - Dec 20, 2021

## 2021-08-18 NOTE — Patient Instructions (Signed)
We are arranging an upper endoscopy with Dr. Gala Romney in the near future!  Please stop Eliquis 2 days before the procedure.  I have sent in a reflux medication called pantoprazole to take once daily. Make sure to take it on an empty stomach 30 minutes before eating.  Further recommendations to follow!  It was a pleasure to see you today. I want to create trusting relationships with patients to provide genuine, compassionate, and quality care. I value your feedback. If you receive a survey regarding your visit,  I greatly appreciate you taking time to fill this out.   Annitta Needs, PhD, ANP-BC [facility]

## 2021-08-18 NOTE — Progress Notes (Signed)
Gastroenterology Office Note    Referring Provider: Dr. Domenic Polite Primary Care Physician:  Ivy Lynn, NP  Primary GI: Dr. Gala Romney    Chief Complaint   Chief Complaint  Patient presents with   New Patient (Initial Visit)    Reflux      History of Present Illness   [name] is an 81 y.o. female presenting today at the request of Dr. Domenic Polite due to GERD. Daughter is present with her today.    No dysphagia. No abdominal pain. For several years, her lower lip has been numb and tongue feels scalded. Food does not taste the same. Metallic and salty taste. Has seen ENT. Thought may be nerve-related. Has intermittent nausea. Takes phenergan as needed. Sometimes vomiting after eating due to worsening nausea but not often. May have several days of nausea in a row then will go away for about a month. Takes Tums every so often for indigestion. Notes worsening indigestion with spicy foods. States typical GERD symptoms are not often.    Colonoscopy in [place] many years ago per patient, possible polyps. No prior EGD.   Weight normally around 180 in the past. Slow decrease over past 1-2 years and now 162.     Past Medical History:  Diagnosis Date   Anxiety    Arthritis    Bulging lumbar disc    Chronic lower back pain    Coronary atherosclerosis of native coronary artery    DES LAD 02/2008, DES LAD 02/2015, DES ramus and DES x2 mid RCA 02/2020   Depression    Dyslipidemia    Essential hypertension    Facial numbness    Frequent headaches    GERD (gastroesophageal reflux disease)    Glaucoma    Heart attack ([place])    Insomnia    Paroxysmal atrial fibrillation ([place])    Diagnosed October 2019   PSVT (paroxysmal supraventricular tachycardia) ([place])    Refusal of blood transfusions as patient is Jehovah's Witness    Thyroid disease    Type 2 diabetes mellitus ([place])    Borderline   Vitamin D deficiency     Past Surgical History:  Procedure Laterality Date   ATRIAL  FIBRILLATION ABLATION N/A 02/16/2021   Procedure: ATRIAL FIBRILLATION ABLATION;  Surgeon: Thompson Grayer, MD;  Location: [place] [facility];  Service: Cardiovascular;  Laterality: N/A;   CARDIAC CATHETERIZATION N/A 02/23/2015   Procedure: Left Heart Cath and Coronary Angiography;  Surgeon: Sherren Mocha, MD;  Location: [place] [facility];  Service: Cardiovascular;  Laterality: N/A;   CARDIAC CATHETERIZATION N/A 02/23/2015   Procedure: Coronary Stent Intervention;  Surgeon: Sherren Mocha, MD;  Location: [place] [facility];  Service: Cardiovascular;  Laterality: N/A;   CARDIAC CATHETERIZATION N/A 01/04/2016   Procedure: Left Heart Cath and Coronary Angiography;  Surgeon: Peter M Martinique, MD;  Location: [place] [facility];  Service: Cardiovascular;  Laterality: N/A;   CATARACT EXTRACTION     CORONARY ANGIOPLASTY  02/23/2015   CORONARY ANGIOPLASTY WITH STENT PLACEMENT  2009   CORONARY STENT INTERVENTION N/A 03/09/2020   Procedure: CORONARY STENT INTERVENTION;  Surgeon: Jettie Booze, MD;  Location: [place] [facility];  Service: Cardiovascular;  Laterality: N/A;   DILATION AND CURETTAGE OF UTERUS     implantable loop recorder placement  04/01/2020   Medtronic Reveal Linq model LNQ [id number]) implantable loop recorder    LEFT HEART CATH AND CORONARY ANGIOGRAPHY N/A 03/09/2020   Procedure: LEFT HEART CATH AND CORONARY  ANGIOGRAPHY;  Surgeon: Jettie Booze, MD;  Location: [place] [facility];  Service: Cardiovascular;  Laterality: N/A;   TUBAL LIGATION      Current Outpatient Medications  Medication Sig Dispense Refill   apixaban (ELIQUIS) 5 MG TABS tablet TAKE ONE TABLET BY MOUTH TWICE DAILY 60 tablet 5   Ascorbic Acid (VITAMIN C) 500 MG tablet Take 500 mg by mouth daily.       bimatoprost (LUMIGAN) 0.01 % SOLN Place 1 drop into both eyes at bedtime.     Calcium Carbonate Antacid (TUMS PO) Take 2 tablets by mouth 4 (four) times daily as needed (for acid reflux/indigestion).       Camphor-Menthol-Methyl Sal (SALONPAS) 3.02-26-08 % PTCH Apply 1 patch topically daily as needed (pain).     Cholecalciferol (VITAMIN D) 400 UNITS capsule Take 400 Units by mouth daily.       ciclopirox (PENLAC) 8 % solution Apply 1 application topically at bedtime.     Cyanocobalamin (VITAMIN B 12 PO) Take 1 tablet by mouth daily.     diclofenac Sodium (VOLTAREN) 1 % GEL Apply 2 g topically 4 (four) times daily. 50 g 2   diltiazem (CARDIZEM CD) 240 MG 24 hr capsule Take 1 capsule (240 mg total) by mouth daily. 90 capsule 1   Garlic 510 MG TABS Take 1 tablet by mouth at bedtime.     [START ON 08/24/2021] HYDROcodone-acetaminophen (NORCO) 5-325 MG tablet Take 1 tablet by mouth every 6 (six) hours as needed for moderate pain. 30 tablet 0   levothyroxine (SYNTHROID) 50 MCG tablet Take 1 tablet (50 mcg total) by mouth daily before breakfast. 90 tablet 3   losartan (COZAAR) 50 MG tablet Take 1 tablet (50 mg total) by mouth daily. 90 tablet 3   magic mouthwash (nystatin, lidocaine, diphenhydrAMINE) suspension Take 5 mLs by mouth 4 (four) times daily as needed for mouth pain. 180 mL 2   MAGNESIUM CARBONATE PO Take 1 tablet by mouth daily. 400 mg     metoprolol succinate (TOPROL XL) 100 MG 24 hr tablet Take 1 tablet (100 mg total) by mouth daily. Take with or immediately following a meal. 30 tablet 11   nitroGLYCERIN (NITROSTAT) 0.4 MG SL tablet Place 0.4 mg under the tongue every 5 (five) minutes as needed for chest pain.     pantoprazole (PROTONIX) 40 MG tablet Take 1 tablet (40 mg total) by mouth daily. 30 minutes before eating 90 tablet 3   promethazine (PHENERGAN) 25 MG tablet TAKE 1 TABLET EVERY 12 HOURS AS NEEDED FOR NAUSEA 60 tablet 0   rosuvastatin (CRESTOR) 20 MG tablet TAKE ONE (1) TABLET BY MOUTH EVERY DAY 90 tablet 3   timolol (TIMOPTIC) 0.5 % ophthalmic solution Place 1 drop into both eyes daily.     traZODone (DESYREL) 150 MG tablet Take 1 tablet (150 mg total) by mouth at bedtime. 90 tablet 1    TURMERIC PO Take 1 tablet by mouth daily.     VENTOLIN HFA 108 (90 Base) MCG/ACT inhaler Inhale 1-2 puffs into the lungs every 4 (four) hours as needed for shortness of breath. 18 g 1   No current facility-administered medications for this visit.    Allergies as of 08/18/2021   (No Known Allergies)    Family History  Problem Relation Age of Onset   Other Mother        "natural causes"   Heart disease Mother    Cancer Father  unsure of type   Colon cancer Daughter        2011    Social History   Socioeconomic History   Marital status: Widowed    Spouse name: Not on file   Number of children: 8   Years of education: 10th grade   Highest education level: 10th grade  Occupational History   Occupation: RETIRED  Tobacco Use   Smoking status: Never   Smokeless tobacco: Never  Vaping Use   Vaping Use: Never used  Substance and Sexual Activity   Alcohol use: Yes    Alcohol/week: 1.0 standard drink of alcohol    Types: 1 Glasses of wine per week    Comment: RARE OCCASION   Drug use: No   Sexual activity: Not Currently  Other Topics Concern   Not on file  Social History Narrative   Lives at home alone.   Right-handed.   No caffeine use.   Daughter lives next door and helps her out   Social Determinants of Health   Financial Resource Strain: Low Risk  (02/26/2020)   Overall Financial Resource Strain (CARDIA)    Difficulty of Paying Living Expenses: Not hard at all  Food Insecurity: No Food Insecurity (02/26/2020)   Hunger Vital Sign    Worried About Running Out of Food in the Last Year: Never true    Ran Out of Food in the Last Year: Never true  Transportation Needs: No Transportation Needs (02/26/2020)   PRAPARE - Hydrologist (Medical): No    Lack of Transportation (Non-Medical): No  Physical Activity: Inactive (02/26/2020)   Exercise Vital Sign    Days of Exercise per Week: 0 days    Minutes of Exercise per Session: 0 min  Stress:  No Stress Concern Present (02/28/2021)   [place]    Feeling of Stress : Only a little  Social Connections: Moderately Integrated (02/26/2020)   Social Connection and Isolation Panel [NHANES]    Frequency of Communication with Friends and Family: More than three times a week    Frequency of Social Gatherings with Friends and Family: More than three times a week    Attends Religious Services: More than 4 times per year    Active Member of [employer] or Organizations: Yes    Attends Archivist Meetings: More than 4 times per year    Marital Status: Widowed  Intimate Partner Violence: Not At Risk (02/26/2020)   Humiliation, Afraid, Rape, and Kick questionnaire    Fear of Current or Ex-Partner: No    Emotionally Abused: No    Physically Abused: No    Sexually Abused: No     Review of Systems   Gen: Denies any fever, chills, fatigue, weight loss, lack of appetite.  CV: Denies chest pain, heart palpitations, peripheral edema, syncope.  Resp: Denies shortness of breath at rest or with exertion. Denies wheezing or cough.  GI: see HPI GU : Denies urinary burning, urinary frequency, urinary hesitancy MS: Denies joint pain, muscle weakness, cramps, or limitation of movement.  Derm: Denies rash, itching, dry skin Psych: Denies depression, anxiety, memory loss, and confusion Heme: Denies bruising, bleeding, and enlarged lymph nodes.   Physical Exam   BP (!) 149/69   Pulse 71   Temp (!) 97.4 F (36.3 C)   Ht '5\' 4"'$  (1.626 m)   Wt 162 lb 6.4 oz (73.7 kg)   LMP  (LMP Unknown)  BMI 27.88 kg/m  General:   Alert and oriented. Pleasant and cooperative. Well-nourished and well-developed.  Head:  Normocephalic and atraumatic. Eyes:  Without icterus Ears:  Normal auditory acuity. Lungs:  Clear to auscultation bilaterally.  Heart:  S1, S2 present with systolic murmur Abdomen:  +BS, soft, non-tender and non-distended. No HSM  noted. No guarding or rebound. No masses appreciated.  Rectal:  Deferred  Msk:  Symmetrical without gross deformities. Normal posture. Extremities:  Without edema. Neurologic:  Alert and  oriented x4;  grossly normal neurologically. Skin:  Intact without significant lesions or rashes. Psych:  Alert and cooperative. Normal mood and affect.   Assessment   [name] is a very pleasant 81 y.o. female presenting today at the request of Dr. Domenic Polite due to GERD. Daughter is present with her today.   GERD: rare indigestion noted with relief after taking Tums. However, she notes intermittent nausea and occasional vomiting after eating that is sporadic. Associated weight loss over past 1-2 years is concerning; however, she has no abdominal pain.  No prior EGD. No dysphagia symptoms. Needs diagnostic EGD in near future. Will start PPI as well. Symptoms do not seem consistent with chronic mesenteric ischemia.      PLAN   Proceed with upper endoscopy by Dr. Gala Romney in near future: the risks, benefits, and alternatives have been discussed with the patient in detail. The patient states understanding and desires to proceed. ASA 3  HOLD ELIQUIS 48 hours prior  Start pantoprazole once daily  Further recommendations to follow   Annitta Needs, PhD, ANP-BC [facility]

## 2021-08-19 ENCOUNTER — Encounter: Payer: Self-pay | Admitting: *Deleted

## 2021-08-29 ENCOUNTER — Ambulatory Visit (INDEPENDENT_AMBULATORY_CARE_PROVIDER_SITE_OTHER): Payer: Medicare Other

## 2021-08-29 DIAGNOSIS — I4819 Other persistent atrial fibrillation: Secondary | ICD-10-CM

## 2021-08-30 LAB — CUP PACEART REMOTE DEVICE CHECK
Date Time Interrogation Session: 20230703230402
Implantable Pulse Generator Implant Date: 20220210

## 2021-09-09 ENCOUNTER — Telehealth: Payer: Self-pay

## 2021-09-09 NOTE — Telephone Encounter (Signed)
LINQ alert received.  6 AF events, 1 ongoing from 7/20 @ 18:05, controlled rates Burden 96.95, Eliquis Route to triage  Attempted to contact Pt to determine if she is symptomatic. Left direct number to device clinic to return call.  Will forward to Dr. Rayann Heman for review.

## 2021-09-12 NOTE — Telephone Encounter (Signed)
Transmission received 7/24, patient in normal sinus rhythm, appear patient went back into normal rhythm in the early hours of  09/11/21

## 2021-09-13 NOTE — Telephone Encounter (Signed)
The patient daughter [name] ( dpr) left a message for the nurse to return her call. Her number is [phone].

## 2021-09-13 NOTE — Telephone Encounter (Signed)
Returned phone call. Advised patient is back in SR. Advised if she has further questions or concerns please call. Appreciative of call.

## 2021-09-14 NOTE — Patient Instructions (Signed)
LUPE HANDLEY  09/14/2021     '@PREFPERIOPPHARMACY'$ @   Your procedure is scheduled on  09/21/2021.   Report to [facility] at  1115  A.M.   Call this number if you have problems the morning of surgery:  [phone]   Remember:       Follow the diet instructions given to you by the office.       Use your inhaler before you come and bring your rescue inhaler with you.    Take these medicines the morning of surgery with A SIP OF WATER          zyrtec, protonix, cardiazem, norco (if needed), synthroid, metoprolol.     Do not wear jewelry, make-up or nail polish.  Do not wear lotions, powders, or perfumes, or deodorant.  Do not shave 48 hours prior to surgery.  Men may shave face and neck.  Do not bring valuables to the hospital.  [facility] is not responsible for any belongings or valuables.  Contacts, dentures or bridgework may not be worn into surgery.  Leave your suitcase in the car.  After surgery it may be brought to your room.  For patients admitted to the hospital, discharge time will be determined by your treatment team.  Patients discharged the day of surgery will not be allowed to drive home and must have someone with them for 2 hours.    Special instructions:   DO NOT smoke tobacco or vape for 24 hours before your procedure.  Please read over the following fact sheets that you were given. Anesthesia Post-op Instructions and Care and Recovery After Surgery      Upper Endoscopy, Adult, Care After This sheet gives you information about how to care for yourself after your procedure. Your health care provider may also give you more specific instructions. If you have problems or questions, contact your health care provider. What can I expect after the procedure? After the procedure, it is common to have: A sore throat. Mild stomach pain or discomfort. Bloating. Nausea. Follow these instructions at home:  Follow instructions from your health care  provider about what to eat or drink after your procedure. Return to your normal activities as told by your health care provider. Ask your health care provider what activities are safe for you. Take over-the-counter and prescription medicines only as told by your health care provider. If you were given a sedative during the procedure, it can affect you for several hours. Do not drive or operate machinery until your health care provider says that it is safe. Keep all follow-up visits as told by your health care provider. This is important. Contact a health care provider if you have: A sore throat that lasts longer than one day. Trouble swallowing. Get help right away if: You vomit blood or your vomit looks like coffee grounds. You have: A fever. Bloody, black, or tarry stools. A severe sore throat or you cannot swallow. Difficulty breathing. Severe pain in your chest or abdomen. Summary After the procedure, it is common to have a sore throat, mild stomach discomfort, bloating, and nausea. If you were given a sedative during the procedure, it can affect you for several hours. Do not drive or operate machinery until your health care provider says that it is safe. Follow instructions from your health care provider about what to eat or drink after your procedure. Return to your normal activities as told by your health care provider. This information  is not intended to replace advice given to you by your health care provider. Make sure you discuss any questions you have with your health care provider. Document Revised: 12/13/2018 Document Reviewed: 07/09/2017 Elsevier Patient Education  [place] After This sheet gives you information about how to care for yourself after your procedure. Your health care provider may also give you more specific instructions. If you have problems or questions, contact your health care provider. What can I expect after the  procedure? After the procedure, it is common to have: Tiredness. Forgetfulness about what happened after the procedure. Impaired judgment for important decisions. Nausea or vomiting. Some difficulty with balance. Follow these instructions at home: For the time period you were told by your health care provider:     Rest as needed. Do not participate in activities where you could fall or become injured. Do not drive or use machinery. Do not drink alcohol. Do not take sleeping pills or medicines that cause drowsiness. Do not make important decisions or sign legal documents. Do not take care of children on your own. Eating and drinking Follow the diet that is recommended by your health care provider. Drink enough fluid to keep your urine pale yellow. If you vomit: Drink water, juice, or soup when you can drink without vomiting. Make sure you have little or no nausea before eating solid foods. General instructions Have a responsible adult stay with you for the time you are told. It is important to have someone help care for you until you are awake and alert. Take over-the-counter and prescription medicines only as told by your health care provider. If you have sleep apnea, surgery and certain medicines can increase your risk for breathing problems. Follow instructions from your health care provider about wearing your sleep device: Anytime you are sleeping, including during daytime naps. While taking prescription pain medicines, sleeping medicines, or medicines that make you drowsy. Avoid smoking. Keep all follow-up visits as told by your health care provider. This is important. Contact a health care provider if: You keep feeling nauseous or you keep vomiting. You feel light-headed. You are still sleepy or having trouble with balance after 24 hours. You develop a rash. You have a fever. You have redness or swelling around the IV site. Get help right away if: You have trouble  breathing. You have new-onset confusion at home. Summary For several hours after your procedure, you may feel tired. You may also be forgetful and have poor judgment. Have a responsible adult stay with you for the time you are told. It is important to have someone help care for you until you are awake and alert. Rest as told. Do not drive or operate machinery. Do not drink alcohol or take sleeping pills. Get help right away if you have trouble breathing, or if you suddenly become confused. This information is not intended to replace advice given to you by your health care provider. Make sure you discuss any questions you have with your health care provider. Document Revised: 01/11/2021 Document Reviewed: 01/09/2019 Elsevier Patient Education  [place].

## 2021-09-16 ENCOUNTER — Encounter (HOSPITAL_COMMUNITY)
Admission: RE | Admit: 2021-09-16 | Discharge: 2021-09-16 | Disposition: A | Discharge status: Home / Self Care | Payer: Medicare Other | Source: Ambulatory Visit | Attending: Internal Medicine | Admitting: Internal Medicine

## 2021-09-16 ENCOUNTER — Encounter (HOSPITAL_COMMUNITY): Payer: Self-pay

## 2021-09-16 VITALS — BP 160/70 | HR 69 | Temp 98.5°F | Resp 18 | Ht 64.0 in | Wt 165.0 lb

## 2021-09-16 DIAGNOSIS — Z01818 Encounter for other preprocedural examination: Secondary | ICD-10-CM | POA: Insufficient documentation

## 2021-09-16 DIAGNOSIS — E119 Type 2 diabetes mellitus without complications: Secondary | ICD-10-CM | POA: Insufficient documentation

## 2021-09-16 LAB — BASIC METABOLIC PANEL
Anion gap: 7 (ref 5–15)
BUN: 9 mg/dL (ref 8–23)
CO2: 26 mmol/L (ref 22–32)
Calcium: 9.6 mg/dL (ref 8.9–10.3)
Chloride: 104 mmol/L (ref 98–111)
Creatinine, Ser: 0.84 mg/dL (ref 0.44–1.00)
GFR, Estimated: >60 mL/min (ref >60)
Glucose, Bld: 130 mg/dL — ABNORMAL HIGH (ref 70–99)
Potassium: 4.2 mmol/L (ref 3.5–5.1)
Sodium: 137 mmol/L (ref 135–145)

## 2021-09-19 ENCOUNTER — Other Ambulatory Visit: Payer: Self-pay | Admitting: Student

## 2021-09-19 ENCOUNTER — Other Ambulatory Visit: Payer: Self-pay | Admitting: Cardiology

## 2021-09-19 NOTE — Telephone Encounter (Signed)
Prescription refill request for Eliquis received. Indication: Atrial Fib Last office visit: 07/05/21  Myles Gip MD Scr: 0.84 on 09/16/21 Age:  81 Weight: 73.7kg  Based on above findings Eliquis '5mg'$  twice daily is the appropriate dose.  Refill approved.

## 2021-09-21 ENCOUNTER — Encounter (HOSPITAL_COMMUNITY)
Admission: RE | Disposition: A | Discharge status: Home / Self Care | Payer: Self-pay | Source: Home / Self Care | Attending: Internal Medicine

## 2021-09-21 ENCOUNTER — Ambulatory Visit (HOSPITAL_BASED_OUTPATIENT_CLINIC_OR_DEPARTMENT_OTHER): Payer: Medicare Other | Admitting: Anesthesiology

## 2021-09-21 ENCOUNTER — Encounter (HOSPITAL_COMMUNITY): Payer: Self-pay | Admitting: Internal Medicine

## 2021-09-21 ENCOUNTER — Ambulatory Visit (HOSPITAL_COMMUNITY): Payer: Medicare Other | Admitting: Anesthesiology

## 2021-09-21 ENCOUNTER — Other Ambulatory Visit: Payer: Self-pay

## 2021-09-21 ENCOUNTER — Ambulatory Visit (HOSPITAL_COMMUNITY)
Admission: RE | Admit: 2021-09-21 | Discharge: 2021-09-21 | Disposition: A | Discharge status: Home / Self Care | Payer: Medicare Other | Attending: Internal Medicine | Admitting: Internal Medicine

## 2021-09-21 DIAGNOSIS — F419 Anxiety disorder, unspecified: Secondary | ICD-10-CM | POA: Diagnosis not present

## 2021-09-21 DIAGNOSIS — K219 Gastro-esophageal reflux disease without esophagitis: Secondary | ICD-10-CM | POA: Diagnosis not present

## 2021-09-21 DIAGNOSIS — B9681 Helicobacter pylori [H. pylori] as the cause of diseases classified elsewhere: Secondary | ICD-10-CM | POA: Insufficient documentation

## 2021-09-21 DIAGNOSIS — E039 Hypothyroidism, unspecified: Secondary | ICD-10-CM | POA: Diagnosis not present

## 2021-09-21 DIAGNOSIS — K2960 Other gastritis without bleeding: Secondary | ICD-10-CM | POA: Diagnosis not present

## 2021-09-21 DIAGNOSIS — I252 Old myocardial infarction: Secondary | ICD-10-CM | POA: Diagnosis not present

## 2021-09-21 DIAGNOSIS — I2510 Atherosclerotic heart disease of native coronary artery without angina pectoris: Secondary | ICD-10-CM | POA: Insufficient documentation

## 2021-09-21 DIAGNOSIS — Z7901 Long term (current) use of anticoagulants: Secondary | ICD-10-CM | POA: Insufficient documentation

## 2021-09-21 DIAGNOSIS — F32A Depression, unspecified: Secondary | ICD-10-CM | POA: Diagnosis not present

## 2021-09-21 DIAGNOSIS — I25119 Atherosclerotic heart disease of native coronary artery with unspecified angina pectoris: Secondary | ICD-10-CM

## 2021-09-21 DIAGNOSIS — R634 Abnormal weight loss: Secondary | ICD-10-CM | POA: Diagnosis not present

## 2021-09-21 DIAGNOSIS — G709 Myoneural disorder, unspecified: Secondary | ICD-10-CM | POA: Diagnosis not present

## 2021-09-21 DIAGNOSIS — Z79890 Hormone replacement therapy (postmenopausal): Secondary | ICD-10-CM | POA: Diagnosis not present

## 2021-09-21 DIAGNOSIS — E119 Type 2 diabetes mellitus without complications: Secondary | ICD-10-CM | POA: Diagnosis not present

## 2021-09-21 DIAGNOSIS — I10 Essential (primary) hypertension: Secondary | ICD-10-CM

## 2021-09-21 DIAGNOSIS — K3189 Other diseases of stomach and duodenum: Secondary | ICD-10-CM

## 2021-09-21 DIAGNOSIS — I4891 Unspecified atrial fibrillation: Secondary | ICD-10-CM | POA: Diagnosis not present

## 2021-09-21 DIAGNOSIS — R1013 Epigastric pain: Secondary | ICD-10-CM | POA: Insufficient documentation

## 2021-09-21 HISTORY — PX: ESOPHAGOGASTRODUODENOSCOPY (EGD) WITH PROPOFOL: SHX5813

## 2021-09-21 HISTORY — PX: BIOPSY: SHX5522

## 2021-09-21 LAB — GLUCOSE, CAPILLARY: Glucose-Capillary: 148 mg/dL — ABNORMAL HIGH (ref 70–99)

## 2021-09-21 SURGERY — ESOPHAGOGASTRODUODENOSCOPY (EGD) WITH PROPOFOL
Anesthesia: General

## 2021-09-21 MED ORDER — PROPOFOL 500 MG/50ML IV EMUL
INTRAVENOUS | Status: DC | PRN
  Administered 2021-09-21: 100 ug/kg/min via INTRAVENOUS

## 2021-09-21 MED ORDER — PROPOFOL 500 MG/50ML IV EMUL
INTRAVENOUS | Status: AC
  Filled 2021-09-21: qty 50

## 2021-09-21 MED ORDER — EPHEDRINE SULFATE-NACL 50-0.9 MG/10ML-% IV SOSY
PREFILLED_SYRINGE | INTRAVENOUS | Status: DC | PRN
  Administered 2021-09-21: 40 mg via INTRAVENOUS
  Administered 2021-09-21: 10 mg via INTRAVENOUS

## 2021-09-21 MED ORDER — LACTATED RINGERS IV SOLN: INTRAVENOUS | Status: DC | PRN

## 2021-09-21 MED ORDER — PROPOFOL 10 MG/ML IV BOLUS
INTRAVENOUS | Status: DC | PRN
  Administered 2021-09-21: 60 mg via INTRAVENOUS

## 2021-09-21 MED ORDER — EPHEDRINE 5 MG/ML INJ
INTRAVENOUS | Status: AC
  Filled 2021-09-21: qty 5

## 2021-09-21 MED ORDER — LIDOCAINE HCL 1 % IJ SOLN
INTRAMUSCULAR | Status: DC | PRN
  Administered 2021-09-21: 25 mg via INTRADERMAL

## 2021-09-21 NOTE — H&P (Signed)
$'@LOGO'q$ @   Primary Care Physician:  Ivy Lynn, NP Primary Gastroenterologist:  Dr. Gala Romney  Pre-Procedure History & Physical: HPI:  [name] is a 81 y.o. female here for further evaluation of dyspepsia weight loss.  Denies dysphagia.  Past Medical History:  Diagnosis Date   Anxiety    Arthritis    Bulging lumbar disc    Chronic lower back pain    Coronary atherosclerosis of native coronary artery    DES LAD 02/2008, DES LAD 02/2015, DES ramus and DES x2 mid RCA 02/2020   Depression    Dyslipidemia    Essential hypertension    Facial numbness    Frequent headaches    GERD (gastroesophageal reflux disease)    Glaucoma    Heart attack ([place]) 02/2020   Insomnia    Paroxysmal atrial fibrillation ([place])    Diagnosed October 2019   PSVT (paroxysmal supraventricular tachycardia) ([place])    Refusal of blood transfusions as patient is Jehovah's Witness    Thyroid disease    Type 2 diabetes mellitus ([place])    Borderline   Vitamin D deficiency     Past Surgical History:  Procedure Laterality Date   ATRIAL FIBRILLATION ABLATION N/A 02/16/2021   Procedure: ATRIAL FIBRILLATION ABLATION;  Surgeon: Thompson Grayer, MD;  Location: [place] [facility];  Service: Cardiovascular;  Laterality: N/A;   CARDIAC CATHETERIZATION N/A 02/23/2015   Procedure: Left Heart Cath and Coronary Angiography;  Surgeon: Sherren Mocha, MD;  Location: [place] [facility];  Service: Cardiovascular;  Laterality: N/A;   CARDIAC CATHETERIZATION N/A 02/23/2015   Procedure: Coronary Stent Intervention;  Surgeon: Sherren Mocha, MD;  Location: [place] [facility];  Service: Cardiovascular;  Laterality: N/A;   CARDIAC CATHETERIZATION N/A 01/04/2016   Procedure: Left Heart Cath and Coronary Angiography;  Surgeon: Peter M Martinique, MD;  Location: [place] [facility];  Service: Cardiovascular;  Laterality: N/A;   CATARACT EXTRACTION     CORONARY ANGIOPLASTY  02/23/2015   CORONARY ANGIOPLASTY WITH STENT PLACEMENT  2009   CORONARY  STENT INTERVENTION N/A 03/09/2020   Procedure: CORONARY STENT INTERVENTION;  Surgeon: Jettie Booze, MD;  Location: [place] [facility];  Service: Cardiovascular;  Laterality: N/A;   DILATION AND CURETTAGE OF UTERUS     implantable loop recorder placement  04/01/2020   Medtronic Reveal Linq model LNQ 22 [id number]) implantable loop recorder    LEFT HEART CATH AND CORONARY ANGIOGRAPHY N/A 03/09/2020   Procedure: LEFT HEART CATH AND CORONARY ANGIOGRAPHY;  Surgeon: Jettie Booze, MD;  Location: [place] [facility];  Service: Cardiovascular;  Laterality: N/A;   TUBAL LIGATION      Prior to Admission medications   Medication Sig Start Date End Date Taking? Authorizing Provider  acetaminophen (TYLENOL) 500 MG tablet Take 1,000 mg by mouth every 6 (six) hours as needed for moderate pain.   Yes [provider]  Ascorbic Acid (VITAMIN C) 500 MG tablet Take 500 mg by mouth daily.     Yes [provider]  bimatoprost (LUMIGAN) 0.01 % SOLN Place 1 drop into both eyes at bedtime.   Yes [provider]  Calcium Carbonate Antacid (TUMS PO) Take 2 tablets by mouth 4 (four) times daily as needed (for acid reflux/indigestion).    Yes [provider]  Camphor-Menthol-Methyl Sal (SALONPAS) 3.02-26-08 % PTCH Apply 1 patch topically daily as needed (pain).   Yes [provider]  cetirizine (ZYRTEC) 10 MG tablet Take 10 mg by mouth daily as  needed for allergies.   Yes [provider]  Cyanocobalamin (VITAMIN B 12 PO) Take 1 tablet by mouth daily.   Yes [provider]  diclofenac Sodium (VOLTAREN) 1 % GEL Apply 2 g topically 4 (four) times daily. Patient taking differently: Apply 2 g topically 4 (four) times daily as needed (pain). 01/09/20  Yes Ivy Lynn, NP  diltiazem (CARDIZEM CD) 240 MG 24 hr capsule Take 1 capsule (240 mg total) by mouth daily. 07/05/21  Yes Satira Sark, MD  GARLIC PO Take 1 tablet by mouth daily.   Yes  [provider]  HYDROcodone-acetaminophen (NORCO) 5-325 MG tablet Take 1 tablet by mouth every 6 (six) hours as needed for moderate pain. 08/24/21  Yes Ivy Lynn, NP  levothyroxine (SYNTHROID) 50 MCG tablet Take 1 tablet (50 mcg total) by mouth daily before breakfast. 08/09/21  Yes Brita Romp, NP  losartan (COZAAR) 50 MG tablet Take 1 tablet (50 mg total) by mouth daily. 01/12/21  Yes Emokpae, Courage, MD  MAGNESIUM CARBONATE PO Take 1 tablet by mouth at bedtime.   Yes [provider]  metoprolol succinate (TOPROL XL) 100 MG 24 hr tablet Take 1 tablet (100 mg total) by mouth daily. Take with or immediately following a meal. 01/12/21 01/12/22 Yes Emokpae, Courage, MD  nitroGLYCERIN (NITROSTAT) 0.4 MG SL tablet Place 0.4 mg under the tongue every 5 (five) minutes as needed for chest pain.   Yes [provider]  promethazine (PHENERGAN) 25 MG tablet TAKE 1 TABLET EVERY 12 HOURS AS NEEDED FOR NAUSEA 04/11/21  Yes Ivy Lynn, NP  rosuvastatin (CRESTOR) 20 MG tablet TAKE ONE (1) TABLET BY MOUTH EVERY DAY 09/19/21  Yes Satira Sark, MD  timolol (TIMOPTIC) 0.5 % ophthalmic solution Place 1 drop into both eyes daily.   Yes [provider]  traZODone (DESYREL) 150 MG tablet Take 1 tablet (150 mg total) by mouth at bedtime. 06/24/21  Yes Ivy Lynn, NP  TURMERIC PO Take 1 tablet by mouth daily.   Yes [provider]  VENTOLIN HFA 108 (90 Base) MCG/ACT inhaler Inhale 1-2 puffs into the lungs every 4 (four) hours as needed for shortness of breath. 01/12/21  Yes Emokpae, Courage, MD  Vitamin D, Cholecalciferol, 10 MCG (400 UNIT) CAPS Take 400 Units by mouth daily.   Yes [provider]  ELIQUIS 5 MG TABS tablet TAKE ONE TABLET BY MOUTH TWICE DAILY 09/19/21   Satira Sark, MD  magic mouthwash (nystatin, lidocaine, diphenhydrAMINE) suspension Take 5 mLs by mouth 4 (four) times daily as needed for mouth pain. 03/18/21   Ivy Lynn, NP  pantoprazole (PROTONIX) 40 MG tablet Take 1 tablet (40 mg total) by mouth daily. 30 minutes before eating 08/18/21   Annitta Needs, NP    Allergies as of 08/18/2021   (No Known Allergies)    Family History  Problem Relation Age of Onset   Other Mother        "natural causes"   Heart disease Mother    Cancer Father        unsure of type   Colon cancer Daughter        2011    Social History   Socioeconomic History   Marital status: Widowed    Spouse name: Not on file   Number of children: 8   Years of education: 10th grade   Highest education level: 10th grade  Occupational History   Occupation: RETIRED  Tobacco Use   Smoking status: Never   Smokeless tobacco: Never  Vaping Use   Vaping Use: Never used  Substance and Sexual Activity   Alcohol use: Yes    Alcohol/week: 1.0 standard drink of alcohol    Types: 1 Glasses of wine per week    Comment: RARE OCCASION   Drug use: No   Sexual activity: Not Currently  Other Topics Concern   Not on file  Social History Narrative   Lives at home alone.   Right-handed.   No caffeine use.   Daughter lives next door and helps her out   Social Determinants of Health   Financial Resource Strain: Low Risk  (02/26/2020)   Overall Financial Resource Strain (CARDIA)    Difficulty of Paying Living Expenses: Not hard at all  Food Insecurity: No Food Insecurity (02/26/2020)   Hunger Vital Sign    Worried About Running Out of Food in the Last Year: Never true    Ran Out of Food in the Last Year: Never true  Transportation Needs: No Transportation Needs (02/26/2020)   PRAPARE - Hydrologist (Medical): No    Lack of Transportation (Non-Medical): No  Physical Activity: Inactive (02/26/2020)   Exercise Vital Sign    Days of Exercise per Week: 0 days    Minutes of Exercise per Session: 0 min  Stress: No Stress Concern Present (02/28/2021)   [place]    Feeling of Stress : Only a little  Social Connections: Moderately Integrated (02/26/2020)   Social Connection and Isolation Panel [NHANES]    Frequency of Communication with Friends and Family: More than three times a week    Frequency of Social Gatherings with Friends and Family: More than three times a week    Attends Religious Services: More than 4 times per year    Active Member of [employer] or Organizations: Yes    Attends Archivist Meetings: More than 4 times per year    Marital Status: Widowed  Intimate Partner Violence: Not At Risk (02/26/2020)   Humiliation, Afraid, Rape, and Kick questionnaire    Fear of Current or Ex-Partner: No    Emotionally Abused: No    Physically Abused: No    Sexually Abused: No    Review of Systems: See HPI, otherwise negative ROS  Physical Exam: BP (!) 147/58   Pulse 66   Temp 98.2 F (36.8 C) (Oral)   Resp 16   LMP  (LMP Unknown)   SpO2 98%  Mouth:  No deformity or lesions. Neck:  Supple; no masses or thyromegaly. No significant cervical adenopathy. Lungs:  Clear throughout to auscultation.   No wheezes, crackles, or rhonchi. No acute distress. Heart:  Regular rate and rhythm; no murmurs, clicks, rubs,  or gallops. Abdomen: Non-distended, normal bowel sounds.  Soft and nontender without appreciable mass or hepatosplenomegaly.  Pulses:  Normal pulses noted. Extremities:  Without clubbing or edema.  Impression/Plan: 81 year old lady with insidious weight loss early satiety, dyspeptic symptoms.  Denies dysphagia.  Here for diagnostic EGD to further evaluate. The risks, benefits, limitations, alternatives and imponderables have been reviewed with the patient. Potential for esophageal dilation, biopsy, etc. have also been reviewed.  Questions have been answered. All parties agreeable.      Notice: This dictation was prepared with Dragon dictation along with smaller phrase technology. Any transcriptional errors that result  from this process are unintentional and may not be  corrected upon review.

## 2021-09-21 NOTE — Anesthesia Preprocedure Evaluation (Signed)
Anesthesia Evaluation  Patient identified by MRN, date of birth, ID band Patient awake    Reviewed: Allergy & Precautions, H&P , NPO status , Patient's Chart, lab work & pertinent test results, reviewed documented beta blocker date and time   Airway Mallampati: II  TM Distance: >3 FB Neck ROM: full    Dental no notable dental hx.    Pulmonary neg pulmonary ROS,    Pulmonary exam normal breath sounds clear to auscultation       Cardiovascular Exercise Tolerance: Good hypertension, + angina + CAD and + Past MI  + dysrhythmias Atrial Fibrillation  Rhythm:irregular Rate:Normal     Neuro/Psych  Headaches, PSYCHIATRIC DISORDERS Anxiety Depression  Neuromuscular disease    GI/Hepatic Neg liver ROS, GERD  Medicated,  Endo/Other  diabetes, Type 2Hypothyroidism   Renal/GU negative Renal ROS  negative genitourinary   Musculoskeletal   Abdominal   Peds  Hematology negative hematology ROS (+)   Anesthesia Other Findings   Reproductive/Obstetrics negative OB ROS                             Anesthesia Physical Anesthesia Plan  ASA: 3  Anesthesia Plan: General   Post-op Pain Management:    Induction:   PONV Risk Score and Plan: Propofol infusion  Airway Management Planned:   Additional Equipment:   Intra-op Plan:   Post-operative Plan:   Informed Consent: I have reviewed the patients History and Physical, chart, labs and discussed the procedure including the risks, benefits and alternatives for the proposed anesthesia with the patient or authorized representative who has indicated his/her understanding and acceptance.     Dental Advisory Given  Plan Discussed with: CRNA  Anesthesia Plan Comments:         Anesthesia Quick Evaluation

## 2021-09-21 NOTE — Progress Notes (Signed)
Carelink Summary Report / Loop Recorder 

## 2021-09-21 NOTE — Op Note (Signed)
[facility] Patient Name: [name] Procedure Date: 09/21/2021 12:20 PM MRN: [id number] Date of Birth: [birth year] Attending MD: Norvel Richards , MD CSN: [id number] Age: 81 Admit Type: Outpatient Procedure:                Upper GI endoscopy Indications:              Dyspepsia, Weight loss Providers:                Norvel Richards, MD, Lurline Del, RN, Raphael Gibney, Technician Referring MD:              Medicines:                Propofol per Anesthesia Complications:            No immediate complications. Estimated Blood Loss:     Estimated blood loss was minimal. Procedure:                Pre-Anesthesia Assessment:                           - Prior to the procedure, a History and Physical                            was performed, and patient medications and                            allergies were reviewed. The patient's tolerance of                            previous anesthesia was also reviewed. The risks                            and benefits of the procedure and the sedation                            options and risks were discussed with the patient.                            All questions were answered, and informed consent                            was obtained. Prior Anticoagulants: The patient has                            taken no previous anticoagulant or antiplatelet                            agents. ASA Grade Assessment: III - A patient with                            severe systemic disease. After reviewing the risks  and benefits, the patient was deemed in                            satisfactory condition to undergo the procedure.                           After obtaining informed consent, the endoscope was                            passed under direct vision. Throughout the                            procedure, the patient's blood pressure, pulse, and                            oxygen  saturations were monitored continuously. The                            [id number]-[id number] ([id number]) scope was introduced through the                            mouth, and advanced to the second part of duodenum.                            The upper GI endoscopy was accomplished without                            difficulty. The patient tolerated the procedure                            well. Scope In: 12:48:59 PM Scope Out: 12:53:31 PM Total Procedure Duration: 0 hours 4 minutes 32 seconds  Findings:      The examined esophagus was normal. Stomach empty. Mild "mottling" of the       gastric mucosa with subtle snakeskin appearance. No ulcer or       infiltrating process observed. Patent pylorus.      The duodenal bulb and second portion of the duodenum were normal. Impression:               - Normal esophagus. Abnormal gastric mucosa of                            uncertain significance - status post biopsy.                           - Normal duodenal bulb and second portion of the                            duodenum.                           - Moderate Sedation:      Moderate (conscious) sedation was personally administered by an       anesthesia professional. The following parameters were monitored: oxygen       saturation, heart rate, blood  pressure, respiratory rate, EKG, adequacy       of pulmonary ventilation, and response to care. Recommendation:           - Patient has a contact number available for                            emergencies. The signs and symptoms of potential                            delayed complications were discussed with the                            patient. Return to normal activities tomorrow.                            Written discharge instructions were provided to the                            patient.                           - Advance diet as tolerated.                           - Continue present medications. Follow-up on                             pathology. Resume Eliquis today.                           - Return to my office (date not yet determined). Procedure Code(s):        --- Professional ---                           [phone], Esophagogastroduodenoscopy, flexible,                            transoral; diagnostic, including collection of                            specimen(s) by brushing or washing, when performed                            (separate procedure) Diagnosis Code(s):        --- Professional ---                           R10.13, Epigastric pain                           R63.4, Abnormal weight loss CPT copyright 2019 American Medical Association. All rights reserved. The codes documented in this report are preliminary and upon coder review may  be revised to meet current compliance requirements. Cristopher Estimable. Malyna Budney, MD Norvel Richards, MD 09/21/2021 1:08:49 PM This report has been signed electronically. Number of Addenda: 0

## 2021-09-21 NOTE — Transfer of Care (Signed)
Immediate Anesthesia Transfer of Care Note  Patient: [name]  Procedure(s) Performed: ESOPHAGOGASTRODUODENOSCOPY (EGD) WITH PROPOFOL BIOPSY  Patient Location: PACU  Anesthesia Type:General  Level of Consciousness: awake, alert  and oriented  Airway & Oxygen Therapy: Patient Spontanous Breathing  Post-op Assessment: Report given to RN, Post -op Vital signs reviewed and stable, Patient moving all extremities X 4 and Patient able to stick tongue midline  Post vital signs: Reviewed  Last Vitals:  Vitals Value Taken Time  BP 106/79   Temp 97.5   Pulse 57   Resp 12   SpO2 93    Last Pain:  Vitals:   09/21/21 1123  TempSrc: Oral  PainSc: 0-No pain         Complications: No notable events documented.

## 2021-09-21 NOTE — Discharge Instructions (Signed)
EGD Discharge instructions Please read the instructions outlined below and refer to this sheet in the next few weeks. These discharge instructions provide you with general information on caring for yourself after you leave the hospital. Your doctor may also give you specific instructions. While your treatment has been planned according to the most current medical practices available, unavoidable complications occasionally occur. If you have any problems or questions after discharge, please call your doctor. ACTIVITY You may resume your regular activity but move at a slower pace for the next 24 hours.  Take frequent rest periods for the next 24 hours.  Walking will help expel (get rid of) the air and reduce the bloated feeling in your abdomen.  No driving for 24 hours (because of the anesthesia (medicine) used during the test).  You may shower.  Do not sign any important legal documents or operate any machinery for 24 hours (because of the anesthesia used during the test).  NUTRITION Drink plenty of fluids.  You may resume your normal diet.  Begin with a light meal and progress to your normal diet.  Avoid alcoholic beverages for 24 hours or as instructed by your caregiver.  MEDICATIONS You may resume your normal medications unless your caregiver tells you otherwise.  WHAT YOU CAN EXPECT TODAY You may experience abdominal discomfort such as a feeling of fullness or "gas" pains.  FOLLOW-UP Your doctor will discuss the results of your test with you.  SEEK IMMEDIATE MEDICAL ATTENTION IF ANY OF THE FOLLOWING OCCUR: Excessive nausea (feeling sick to your stomach) and/or vomiting.  Severe abdominal pain and distention (swelling).  Trouble swallowing.  Temperature over 101 F (37.8 C).  Rectal bleeding or vomiting of blood.    Stomach appeared a little inflamed.  No ulcer no tumor found.  I did take biopsies of your stomach.  Further recommendations to follow pending review of pathology  report  Resume Eliquis today.  At patient request, I called Enola at [phone] reviewed findings and recommendations

## 2021-09-22 LAB — SURGICAL PATHOLOGY

## 2021-09-23 NOTE — Anesthesia Postprocedure Evaluation (Signed)
Anesthesia Post Note  Patient: [name]  Procedure(s) Performed: ESOPHAGOGASTRODUODENOSCOPY (EGD) WITH PROPOFOL BIOPSY  Patient location during evaluation: Phase II Anesthesia Type: General Level of consciousness: awake Pain management: pain level controlled Vital Signs Assessment: post-procedure vital signs reviewed and stable Respiratory status: spontaneous breathing and respiratory function stable Cardiovascular status: blood pressure returned to baseline and stable Postop Assessment: no headache and no apparent nausea or vomiting Anesthetic complications: no Comments: Late entry   No notable events documented.   Last Vitals:  Vitals:   09/21/21 1123 09/21/21 1303  BP: (!) 147/58 106/79  Pulse: 66 60  Resp: 16 12  Temp: 36.8 C (!) 36.4 C  SpO2: 98% 94%    Last Pain:  Vitals:   09/21/21 1303  TempSrc: Oral  PainSc: 0-No pain                 Louann Sjogren

## 2021-09-25 ENCOUNTER — Encounter: Payer: Self-pay | Admitting: Internal Medicine

## 2021-09-26 ENCOUNTER — Encounter: Payer: Self-pay | Admitting: *Deleted

## 2021-09-26 ENCOUNTER — Telehealth: Payer: Self-pay

## 2021-09-26 MED ORDER — AMOXICILLIN 500 MG PO TABS: 1000.0000 mg | ORAL_TABLET | Freq: Two times a day (BID) | ORAL | 0 refills | Status: AC

## 2021-09-26 MED ORDER — CLARITHROMYCIN 500 MG PO TABS: 500.0000 mg | ORAL_TABLET | Freq: Two times a day (BID) | ORAL | 0 refills | Status: AC

## 2021-09-26 MED ORDER — LANSOPRAZOLE 30 MG PO CPDR: 30.0000 mg | DELAYED_RELEASE_CAPSULE | Freq: Two times a day (BID) | ORAL | 0 refills | Status: DC

## 2021-09-26 NOTE — Telephone Encounter (Signed)
Pt's daughter was made aware and verbalized understanding. RX was sent to pharmacy on file. Routing to front to schedule follow up visit in 3 mths.

## 2021-09-26 NOTE — Telephone Encounter (Signed)
3 mth f/u sch'd 12/28/21 with Vicente Males, apt letter mailed to patient

## 2021-09-26 NOTE — Telephone Encounter (Signed)
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  Patient needs PrevPak or generic equivalent x 14 days--hold any acid suppression and/or statin therapy (Protonix and Crestor) patient may be taking during treatment. Then resume; Office follow up with [place] in 3 months ----- Message ----- From: Interface, Lab In [place] Sent: 09/22/2021   5:12 PM EDT To: Daneil Dolin, MD

## 2021-09-27 ENCOUNTER — Encounter (HOSPITAL_COMMUNITY): Payer: Self-pay | Admitting: Internal Medicine

## 2021-09-28 DIAGNOSIS — H04123 Dry eye syndrome of bilateral lacrimal glands: Secondary | ICD-10-CM | POA: Diagnosis not present

## 2021-09-28 DIAGNOSIS — E119 Type 2 diabetes mellitus without complications: Secondary | ICD-10-CM | POA: Diagnosis not present

## 2021-09-28 DIAGNOSIS — H401131 Primary open-angle glaucoma, bilateral, mild stage: Secondary | ICD-10-CM | POA: Diagnosis not present

## 2021-09-28 DIAGNOSIS — H26491 Other secondary cataract, right eye: Secondary | ICD-10-CM | POA: Diagnosis not present

## 2021-09-28 DIAGNOSIS — Z961 Presence of intraocular lens: Secondary | ICD-10-CM | POA: Diagnosis not present

## 2021-09-28 LAB — HM DIABETES EYE EXAM

## 2021-09-30 ENCOUNTER — Ambulatory Visit (INDEPENDENT_AMBULATORY_CARE_PROVIDER_SITE_OTHER): Payer: Medicare Other

## 2021-09-30 DIAGNOSIS — I4819 Other persistent atrial fibrillation: Secondary | ICD-10-CM | POA: Diagnosis not present

## 2021-09-30 LAB — CUP PACEART REMOTE DEVICE CHECK
Date Time Interrogation Session: 20230811091404
Implantable Pulse Generator Implant Date: 20220210

## 2021-10-07 ENCOUNTER — Other Ambulatory Visit: Payer: Self-pay | Admitting: Nurse Practitioner

## 2021-10-07 ENCOUNTER — Telehealth: Payer: Self-pay | Admitting: Cardiology

## 2021-10-07 DIAGNOSIS — F5101 Primary insomnia: Secondary | ICD-10-CM

## 2021-10-07 NOTE — Telephone Encounter (Signed)
Says her co-pay for eliquis was $80 and was told it would be higher next month since she was in the donut hole. Says she has spent over $400 in prescriptions for this year 2023. Advised to bring 2023 proof of out of pocket rx expense to office to be faxed to [facility] and she would be able to get eliquis directly from [facility]. Says she will bring to office on Monday. Verbalized understanding of plan.

## 2021-10-07 NOTE — Telephone Encounter (Signed)
Pt c/o medication issue:  1. Name of Medication: ELIQUIS 5 MG TABS tablet  2. How are you currently taking this medication (dosage and times per day)? TAKE ONE TABLET BY MOUTH TWICE DAILY  3. Are you having a reaction (difficulty breathing--STAT)?    4. What is your medication issue? Calling in with question bout the medication

## 2021-10-13 ENCOUNTER — Encounter: Payer: Self-pay | Admitting: *Deleted

## 2021-10-13 NOTE — Progress Notes (Signed)
Fax notification received from [facility] that Eliquis approved from 10/12/2021 through 02/19/2022

## 2021-10-19 ENCOUNTER — Telehealth: Payer: Self-pay | Admitting: *Deleted

## 2021-10-19 NOTE — Patient Outreach (Signed)
  Care Coordination   Initial Visit Note   10/19/2021 Name: [name] MRN: [id number] DOB: Jan 03, 1941  [name] is a 81 y.o. year old female who sees Ivy Lynn, NP for primary care. I spoke with  Enola daughter by phone today.  What matters to the patients health and wellness today?  Patient needs help with cost of medications. She needs needs an advance directive. Patient daughter will call back and give names of medications assistance needed with and other patient needs . She is sick and laying down. Patient daughter will call back [phone].   Goals Addressed   None     SDOH assessments and interventions completed:  No     Care Coordination Interventions Activated:  Yes  Care Coordination Interventions:  No, not indicated   Follow up plan: No further intervention required.   Encounter Outcome:  Pt. Request to Call [facility] [phone]

## 2021-10-20 ENCOUNTER — Other Ambulatory Visit: Payer: Self-pay

## 2021-10-25 NOTE — Progress Notes (Signed)
Carelink Summary Report / Loop Recorder 

## 2021-10-31 ENCOUNTER — Ambulatory Visit (INDEPENDENT_AMBULATORY_CARE_PROVIDER_SITE_OTHER): Payer: Medicare Other

## 2021-10-31 DIAGNOSIS — I4819 Other persistent atrial fibrillation: Secondary | ICD-10-CM | POA: Diagnosis not present

## 2021-10-31 LAB — CUP PACEART REMOTE DEVICE CHECK
Date Time Interrogation Session: 20230911132106
Implantable Pulse Generator Implant Date: 20220210

## 2021-11-08 ENCOUNTER — Ambulatory Visit: Payer: Self-pay | Admitting: *Deleted

## 2021-11-08 NOTE — Patient Outreach (Signed)
Care Coordination   Initial Visit Note   11/12/2021 Name: ASLI TOKARSKI MRN: [id number] DOB: September 17, 1940  [name] is a 81 y.o. year old female who sees Ivy Lynn, NP for primary care. I spoke with  [name] by phone today.  What matters to the patients health and wellness today?  Hypertension does not check BP at home. Reports she has a BP monitor but does not feel it takes appropriate values She does have access to her insurance company U card ($65) to purchase a new BP monitor.  Rushing & pain causes elevations in blood pressure acknowledged. She informs RN CM she "stay in pan all the time"  She was able to identify her hip pain may be a trigger   Hip pain She reports she was rubbing down her hips and legs prior to this outreach. She reports pain that radiates from her back to her ankle daily Discuss statins and sciatica related to hip pain. She reports a hx of statin intolerance for her and her daughter [name]   Headaches  are noticed generally after she wakes up  Her daughter questions if the patient is having allergy symptoms. Patient verifies she takes her allergy medicine but does not use devices like Navage & nettie pots Answered Questions patient had related to why headaches may occur  Permission was given to RN CM to send information as needed to [facility] e-mail plus permission to speak with [place] at any time about her health/medical issues   Goals Addressed               This Visit's Progress     Patient Stated     Develop Plan of Care for Management of Hypertension (THN) (pt-stated)   Not on track     Care Coordination Interventions: Evaluation of current treatment plan related to hypertension self management and patient's adherence to plan as established by provider Discussed plans with patient for ongoing care management follow up and provided patient with direct contact information for care management team Discussed complications of poorly  controlled blood pressure such as heart disease, stroke, circulatory complications, vision complications, kidney impairment, sexual dysfunction Screening for signs and symptoms of depression related to chronic disease state  Assessed social determinant of health barriers Encouraged her to use her united healthcare U card to get a new BP monitor Discussed how pain or anxiety from rushing causes elevations on Blood pressure (BP)       manage back pain & headaches (THN) (pt-stated)   Not on track     Care Coordination Interventions: Discussed importance of adherence to all scheduled medical appointments Counseled on the importance of reporting any/all new or changed pain symptoms or management strategies to pain management provider Reviewed with patient prescribed pharmacological and nonpharmacological pain relief strategies Discussed sciatica pain & pain related to use of statin medicines to manage hyperlipidemia- Discussed other options to manage cholesterol that reduces the side effects of statins (Nexlizet) Discussed how pain especially bilateral hip pain affects her blood pressure  Discussed reasons for headaches: cardiac (elevated BP, clogged arteries), allergies, visual issues, stress/anxiety, injury to head/neck, neurological (nerves)/migraines Encouraged her to discuss the continued bilateral hip pain also with her MDs Assessed when she has headaches (mostly mornings)         SDOH assessments and interventions completed:  Yes  SDOH Interventions Today    Flowsheet Row Most Recent Value  SDOH Interventions   Food Insecurity Interventions Intervention Not Indicated  Transportation Interventions Intervention Not Indicated  Financial Strain Interventions Intervention Not Indicated  Stress Interventions Intervention Not Indicated        Care Coordination Interventions Activated:  Yes  Care Coordination Interventions:  Yes, provided   Follow up plan: Follow up call scheduled for  11/22/21     Encounter Outcome:  Pt. Visit Completed   Venora Kautzman L. Lavina Hamman, RN, BSN, [place] Coordinator Office number [phone]

## 2021-11-08 NOTE — Patient Outreach (Signed)
  Care Coordination   11/08/2021 Name: [name] MRN: [id number] DOB: Jun 25, 1940   Care Coordination Outreach Attempts:  An unsuccessful telephone outreach was attempted today to offer the patient information about available care coordination services as a benefit of their health plan.   Follow Up Plan:  Additional outreach attempts will be made to offer the patient care coordination information and services.   Encounter Outcome:  No Answer  Care Coordination Interventions Activated:  No   Care Coordination Interventions:  No, not indicated    SIG Ava Tangney L. Lavina Hamman, RN, BSN, [place] Coordinator Office number [phone]

## 2021-11-08 NOTE — Patient Instructions (Addendum)
Visit Information  Thank you for taking time to visit with me today. Please don't hesitate to contact me if I can be of assistance to you.   Following are the goals we discussed today:   Goals Addressed               This Visit's Progress     Patient Stated     Develop Plan of Care for Management of Hypertension (THN) (pt-stated)   Not on track     Care Coordination Interventions: Evaluation of current treatment plan related to hypertension self management and patient's adherence to plan as established by provider Discussed plans with patient for ongoing care management follow up and provided patient with direct contact information for care management team Discussed complications of poorly controlled blood pressure such as heart disease, stroke, circulatory complications, vision complications, kidney impairment, sexual dysfunction Screening for signs and symptoms of depression related to chronic disease state  Assessed social determinant of health barriers Encouraged her to use her united healthcare U card to get a new BP monitor Discussed how pain or anxiety from rushing causes elevations on Blood pressure (BP)       manage back pain & headaches (THN) (pt-stated)   Not on track     Care Coordination Interventions: Discussed importance of adherence to all scheduled medical appointments Counseled on the importance of reporting any/all new or changed pain symptoms or management strategies to pain management provider Reviewed with patient prescribed pharmacological and nonpharmacological pain relief strategies Discussed sciatica pain & pain related to use of statin medicines to manage hyperlipidemia- Discussed other options to manage cholesterol that reduces the side effects of statins (Nexlizet) Discussed how pain especially bilateral hip pain affects her blood pressure  Discussed reasons for headaches: cardiac (elevated BP, clogged arteries), allergies, visual issues, stress/anxiety,  injury to head/neck, neurological (nerves)/migraines Encouraged her to discuss the continued bilateral hip pain also with her MDs Assessed when she has headaches (mostly mornings)         Our next appointment is by telephone on 11/22/21 at 10:30 am  Please call the care guide team at [phone] if you need to cancel or reschedule your appointment.   If you are experiencing a Mental Health or [place] or need someone to talk to, please call the Suicide and Crisis Lifeline: 988 call the [place] National Suicide Prevention Lifeline: (410) 675-0472 or TTY: 415-033-7000 TTY (930)272-4772) to talk to a trained counselor call 1-800-273-TALK (toll free, 24 hour hotline) call the [facility]: [phone] call 911   Patient verbalizes understanding of instructions and care plan provided today and agrees to view in [place]. Active MyChart status and patient understanding of how to access instructions and care plan via MyChart confirmed with patient.     The patient has been provided with contact information for the care management team and has been advised to call with any health related questions or concerns.   [place] Lavina Hamman, RN, BSN, [place] Coordinator Office number [phone]

## 2021-11-10 ENCOUNTER — Other Ambulatory Visit: Payer: Self-pay | Admitting: Cardiology

## 2021-11-15 ENCOUNTER — Ambulatory Visit (HOSPITAL_COMMUNITY)
Admission: RE | Admit: 2021-11-15 | Discharge: 2021-11-15 | Disposition: A | Discharge status: Home / Self Care | Payer: Medicare Other | Source: Ambulatory Visit | Attending: Physician Assistant | Admitting: Physician Assistant

## 2021-11-15 ENCOUNTER — Encounter (HOSPITAL_COMMUNITY): Payer: Self-pay | Admitting: Physician Assistant

## 2021-11-15 ENCOUNTER — Encounter (HOSPITAL_COMMUNITY): Payer: Self-pay

## 2021-11-15 VITALS — BP 140/88 | HR 61 | Ht 64.0 in | Wt 164.0 lb

## 2021-11-15 DIAGNOSIS — I5032 Chronic diastolic (congestive) heart failure: Secondary | ICD-10-CM | POA: Insufficient documentation

## 2021-11-15 DIAGNOSIS — I4819 Other persistent atrial fibrillation: Secondary | ICD-10-CM

## 2021-11-15 DIAGNOSIS — E119 Type 2 diabetes mellitus without complications: Secondary | ICD-10-CM | POA: Diagnosis not present

## 2021-11-15 DIAGNOSIS — E785 Hyperlipidemia, unspecified: Secondary | ICD-10-CM | POA: Diagnosis not present

## 2021-11-15 DIAGNOSIS — I480 Paroxysmal atrial fibrillation: Secondary | ICD-10-CM | POA: Diagnosis not present

## 2021-11-15 DIAGNOSIS — I110 Hypertensive heart disease with heart failure: Secondary | ICD-10-CM | POA: Diagnosis not present

## 2021-11-15 DIAGNOSIS — I2510 Atherosclerotic heart disease of native coronary artery without angina pectoris: Secondary | ICD-10-CM | POA: Insufficient documentation

## 2021-11-15 DIAGNOSIS — Z7901 Long term (current) use of anticoagulants: Secondary | ICD-10-CM | POA: Diagnosis not present

## 2021-11-15 DIAGNOSIS — D6869 Other thrombophilia: Secondary | ICD-10-CM | POA: Insufficient documentation

## 2021-11-15 NOTE — Progress Notes (Signed)
Primary Care Physician: Ivy Lynn, NP Primary Cardiologist: Dr Domenic Polite Primary Electrophysiologist: Dr Myles Gip (new) Referring Physician: Dr Reinaldo Raddle [name] is a 81 y.o. female with a history of CAD, HTN, DM, HLD, and persistent atrial fibrillation who presents for follow up in the [facility]. The patient was initially diagnosed with atrial fibrillation on 11/2017 after presenting with symptoms of palpitations and chest pain to ER and found to be in afib with RVR. She was discharged with Eliquis and rate control with plan to cardiovert on f/u but she had converted on her own. She denies snoring or significant alcohol use. Patient was seen at the ER 11/11/18 with symptoms of SOB, chest tightness, and palpitations for 3 days. She was found to be in afib with RVR and underwent successful DCCV. Zio patch prior to this episode showed no afib but did show very frequent short episodes of SVT. She was started on amiodarone which controlled her afib but unfortunately she developed hypothyroidism and this was discontinued.   Patient is s/p afib ablation with Dr Rayann Heman on 02/16/21. There were multiple atypical atrial flutter circuits noted. Post ablation, she had an increase in edema in her right leg, going up to her mid thigh. She started Lasix daily with only minimal improvement. She reports compliance with Eliquis and Plavix. Venous u/s showed DVT in R common femoral vein. She saw Dr Donzetta Matters and recommendation was to continue Eliquis and use compression stockings with no other intervention.   On follow up today, patient reports that she has done well from an afib standpoint. Her ILR shows 0% interim afib burden on her last interrogation. No bleeding issues on anticoagulation. Her primary concerns are regarding orthopedic complaints.   Today, she denies symptoms of palpitations, chest pain, orthopnea, PND, dizziness, presyncope, syncope, snoring, daytime somnolence,  bleeding, or neurologic sequela. The patient is tolerating medications without difficulties and is otherwise without complaint today.    Atrial Fibrillation Risk Factors:  she does not have symptoms or diagnosis of sleep apnea. she does not have a history of rheumatic fever. she does not have a history of alcohol use. The patient does not have a history of early familial atrial fibrillation or other arrhythmias.  she has a BMI of Body mass index is 28.15 kg/m.Marland Kitchen Filed Weights   11/15/21 1056  Weight: 74.4 kg    Family History  Problem Relation Age of Onset   Other Mother        "natural causes"   Heart disease Mother    Cancer Father        unsure of type   Colon cancer Daughter        2011     Atrial Fibrillation Management history:  Previous antiarrhythmic drugs: amiodarone  Previous cardioversions: 11/11/18 Previous ablations: 02/16/21 CHADS2VASC score: 6 Anticoagulation history: Eliquis   Past Medical History:  Diagnosis Date   Anxiety    Arthritis    Bulging lumbar disc    Chronic lower back pain    Coronary atherosclerosis of native coronary artery    DES LAD 02/2008, DES LAD 02/2015, DES ramus and DES x2 mid RCA 02/2020   Depression    Dyslipidemia    Essential hypertension    Facial numbness    Frequent headaches    GERD (gastroesophageal reflux disease)    Glaucoma    Heart attack ([place]) 02/2020   Insomnia    Paroxysmal atrial fibrillation ([place])    Diagnosed  October 2019   PSVT (paroxysmal supraventricular tachycardia) ([place])    Refusal of blood transfusions as patient is Jehovah's Witness    Thyroid disease    Type 2 diabetes mellitus ([place])    Borderline   Vitamin D deficiency    Past Surgical History:  Procedure Laterality Date   ATRIAL FIBRILLATION ABLATION N/A 02/16/2021   Procedure: ATRIAL FIBRILLATION ABLATION;  Surgeon: Thompson Grayer, MD;  Location: [place] [facility];  Service: Cardiovascular;  Laterality: N/A;   BIOPSY  09/21/2021    Procedure: BIOPSY;  Surgeon: Daneil Dolin, MD;  [facility];  Service: Endoscopy;;  gastric   CARDIAC CATHETERIZATION N/A 02/23/2015   Procedure: Left Heart Cath and Coronary Angiography;  Surgeon: Sherren Mocha, MD;  Location: [place] [facility];  Service: Cardiovascular;  Laterality: N/A;   CARDIAC CATHETERIZATION N/A 02/23/2015   Procedure: Coronary Stent Intervention;  Surgeon: Sherren Mocha, MD;  Location: [place] [facility];  Service: Cardiovascular;  Laterality: N/A;   CARDIAC CATHETERIZATION N/A 01/04/2016   Procedure: Left Heart Cath and Coronary Angiography;  Surgeon: Peter M Martinique, MD;  Location: [place] [facility];  Service: Cardiovascular;  Laterality: N/A;   CATARACT EXTRACTION     CORONARY ANGIOPLASTY  02/23/2015   CORONARY ANGIOPLASTY WITH STENT PLACEMENT  2009   CORONARY STENT INTERVENTION N/A 03/09/2020   Procedure: CORONARY STENT INTERVENTION;  Surgeon: Jettie Booze, MD;  Location: [place] [facility];  Service: Cardiovascular;  Laterality: N/A;   DILATION AND CURETTAGE OF UTERUS     ESOPHAGOGASTRODUODENOSCOPY (EGD) WITH PROPOFOL N/A 09/21/2021   Procedure: ESOPHAGOGASTRODUODENOSCOPY (EGD) WITH PROPOFOL;  Surgeon: Daneil Dolin, MD;  [facility];  Service: Endoscopy;  Laterality: N/A;  1:00pm   implantable loop recorder placement  04/01/2020   Medtronic Reveal Linq model [id number] [id number]) implantable loop recorder    LEFT HEART CATH AND CORONARY ANGIOGRAPHY N/A 03/09/2020   Procedure: LEFT HEART CATH AND CORONARY ANGIOGRAPHY;  Surgeon: Jettie Booze, MD;  Location: [place] [facility];  Service: Cardiovascular;  Laterality: N/A;   TUBAL LIGATION      Current Outpatient Medications  Medication Sig Dispense Refill   acetaminophen (TYLENOL) 500 MG tablet Take 1,000 mg by mouth every 6 (six) hours as needed for moderate pain.     Ascorbic Acid (VITAMIN C) 500 MG tablet Take 500 mg by mouth daily.       bimatoprost (LUMIGAN) 0.01 % SOLN  Place 1 drop into both eyes at bedtime.     Calcium Carbonate Antacid (TUMS PO) Take 2 tablets by mouth 4 (four) times daily as needed (for acid reflux/indigestion).      Camphor-Menthol-Methyl Sal (SALONPAS) 3.02-26-08 % PTCH Apply 1 patch topically daily as needed (pain).     cetirizine (ZYRTEC) 10 MG tablet Take 10 mg by mouth daily as needed for allergies.     Cyanocobalamin (VITAMIN B 12 PO) Take 1 tablet by mouth daily.     diclofenac Sodium (VOLTAREN) 1 % GEL Apply 2 g topically 4 (four) times daily. (Patient taking differently: Apply 2 g topically 4 (four) times daily as needed (pain).) 50 g 2   diltiazem (CARDIZEM CD) 240 MG 24 hr capsule TAKE ONE CAPSULE BY MOUTH DAILY 90 capsule 1   ELIQUIS 5 MG TABS tablet TAKE ONE TABLET BY MOUTH TWICE DAILY 60 tablet 5   GARLIC PO Take 1 tablet by mouth daily.     HYDROcodone-acetaminophen (NORCO) 5-325 MG tablet Take 1 tablet  by mouth every 6 (six) hours as needed for moderate pain. 30 tablet 0   levothyroxine (SYNTHROID) 50 MCG tablet Take 1 tablet (50 mcg total) by mouth daily before breakfast. 90 tablet 3   losartan (COZAAR) 50 MG tablet Take 1 tablet (50 mg total) by mouth daily. 90 tablet 3   magic mouthwash (nystatin, lidocaine, diphenhydrAMINE) suspension Take 5 mLs by mouth 4 (four) times daily as needed for mouth pain. 180 mL 2   MAGNESIUM CARBONATE PO Take 1 tablet by mouth at bedtime.     metoprolol succinate (TOPROL XL) 100 MG 24 hr tablet Take 1 tablet (100 mg total) by mouth daily. Take with or immediately following a meal. 30 tablet 11   nitroGLYCERIN (NITROSTAT) 0.4 MG SL tablet Place 0.4 mg under the tongue every 5 (five) minutes as needed for chest pain.     pantoprazole (PROTONIX) 40 MG tablet Take 1 tablet (40 mg total) by mouth daily. 30 minutes before eating 90 tablet 3   promethazine (PHENERGAN) 25 MG tablet TAKE 1 TABLET EVERY 12 HOURS AS NEEDED FOR NAUSEA 60 tablet 0   rosuvastatin (CRESTOR) 20 MG tablet TAKE ONE (1) TABLET BY  MOUTH EVERY DAY 90 tablet 1   timolol (TIMOPTIC) 0.5 % ophthalmic solution Place 1 drop into both eyes daily.     traZODone (DESYREL) 150 MG tablet Take 1 tablet (150 mg total) by mouth at bedtime. 90 tablet 1   TURMERIC PO Take 1 tablet by mouth daily.     VENTOLIN HFA 108 (90 Base) MCG/ACT inhaler Inhale 1-2 puffs into the lungs every 4 (four) hours as needed for shortness of breath. 18 g 1   Vitamin D, Cholecalciferol, 10 MCG (400 UNIT) CAPS Take 400 Units by mouth daily.     lansoprazole (PREVACID) 30 MG capsule Take 1 capsule (30 mg total) by mouth 2 (two) times daily for 14 days. 28 capsule 0   No current facility-administered medications for this encounter.    No Known Allergies  Social History   Socioeconomic History   Marital status: Widowed    Spouse name: Not on file   Number of children: 8   Years of education: 10th grade   Highest education level: 10th grade  Occupational History   Occupation: RETIRED  Tobacco Use   Smoking status: Never   Smokeless tobacco: Never   Tobacco comments:    Never smoke 11/15/21  Vaping Use   Vaping Use: Never used  Substance and Sexual Activity   Alcohol use: Yes    Alcohol/week: 1.0 standard drink of alcohol    Types: 1 Glasses of wine per week    Comment: RARE OCCASION   Drug use: No   Sexual activity: Not Currently  Other Topics Concern   Not on file  Social History Narrative   Lives at home alone.   Right-handed.   No caffeine use.   Daughter lives next door and helps her out   Social Determinants of Health   Financial Resource Strain: Low Risk  (11/08/2021)   Overall Financial Resource Strain (CARDIA)    Difficulty of Paying Living Expenses: Not hard at all  Food Insecurity: No Food Insecurity (11/08/2021)   Hunger Vital Sign    Worried About Running Out of Food in the Last Year: Never true    Ran Out of Food in the Last Year: Never true  Transportation Needs: No Transportation Needs (11/08/2021)   PRAPARE -  Hydrologist (  Medical): No    Lack of Transportation (Non-Medical): No  Physical Activity: Inactive (02/26/2020)   Exercise Vital Sign    Days of Exercise per Week: 0 days    Minutes of Exercise per Session: 0 min  Stress: No Stress Concern Present (11/08/2021)   [place]    Feeling of Stress : Only a little  Social Connections: Moderately Integrated (02/26/2020)   Social Connection and Isolation Panel [NHANES]    Frequency of Communication with Friends and Family: More than three times a week    Frequency of Social Gatherings with Friends and Family: More than three times a week    Attends Religious Services: More than 4 times per year    Active Member of [employer] or Organizations: Yes    Attends Archivist Meetings: More than 4 times per year    Marital Status: Widowed  Intimate Partner Violence: Not At Risk (11/08/2021)   Humiliation, Afraid, Rape, and Kick questionnaire    Fear of Current or Ex-Partner: No    Emotionally Abused: No    Physically Abused: No    Sexually Abused: No     ROS- All systems are reviewed and negative except as per the HPI above.  Physical Exam: Vitals:   11/15/21 1056  BP: (!) 140/88  Pulse: 61  Weight: 74.4 kg  Height: '5\' 4"'$  (1.626 m)     GEN- The patient is a well appearing elderly female, alert and oriented x 3 today.   HEENT-head normocephalic, atraumatic, sclera clear, conjunctiva pink, hearing intact, trachea midline. Lungs- Clear to ausculation bilaterally, normal work of breathing Heart- Regular rate and rhythm, no murmurs, rubs or gallops  GI- soft, NT, ND, + BS Extremities- no clubbing, cyanosis, or edema MS- no significant deformity or atrophy Skin- no rash or lesion Psych- euthymic mood, full affect Neuro- strength and sensation are intact   Wt Readings from Last 3 Encounters:  11/15/21 74.4 kg  09/16/21 74.8 kg  08/18/21 73.7  kg    EKG today demonstrates  SR, PAC Vent. rate 61 BPM PR interval 198 ms QRS duration 70 ms QT/QTcB 442/444 ms  Echo 12/11/19 demonstrated   1. Left ventricular ejection fraction, by estimation, is 65 to 70%. The  left ventricle has normal function. The left ventricle has no regional  wall motion abnormalities. There is mild left ventricular hypertrophy.  Left ventricular diastolic parameters are consistent with Grade II diastolic dysfunction (pseudonormalization). Elevated left atrial pressure.   2. Right ventricular systolic function is normal. The right ventricular  size is normal. There is mildly elevated pulmonary artery systolic  pressure.   3. The mitral valve is normal in structure. Mild mitral valve  regurgitation. No evidence of mitral stenosis. Moderate mitral annular calcification.   4. The aortic valve has an indeterminant number of cusps. There is mild calcification of the aortic valve. There is mild thickening of the aortic valve. Aortic valve regurgitation is mild. No aortic stenosis is present.   5. Mild pulmonary HTN, PASP is 36 mmHg.   6. The inferior vena cava is normal in size with greater than 50%  respiratory variability, suggesting right atrial pressure of 3 mmHg.    Epic records are reviewed at length today  CHA2DS2-VASc Score = 6  The patient's score is based upon: CHF History: 1 HTN History: 1 Diabetes History: 0 Stroke History: 0 Vascular Disease History: 1 Age Score: 2 Gender Score: 1  ASSESSMENT AND PLAN: 1. Persistent Atrial Fibrillation (ICD10:  I48.19) The patient's CHA2DS2-VASc score is 6, indicating a 9.7% annual risk of stroke.   Recall amiodarone d/c 2/2 elevated TSH (stopped 11/11/19) Would avoid class IC with h/o CAD. S/p afib ablation with Dr Rayann Heman on 02/16/21 Last ILR report showed 0% afib burden. Continue Eliquis 5 mg BID  Continue diltiazem 240 mg daily Continue Toprol 100 mg daily  2. Secondary Hypercoagulable State  (ICD10:  D68.69) The patient is at significant risk for stroke/thromboembolism based upon her CHA2DS2-VASc Score of 6.  Continue Apixaban (Eliquis).   3. CAD No anginal symptoms.  4. HTN Stable, no changes today.  5. Chronic diastolic CHF Fluid status appears stable.    Follow up with Dr Domenic Polite as scheduled and with Dr Myles Gip to establish care.     [facility] [address] [place], [place] [address] [phone] 11/15/2021 11:06 AM

## 2021-11-17 NOTE — Progress Notes (Signed)
Carelink Summary Report / Loop Recorder 

## 2021-11-22 ENCOUNTER — Ambulatory Visit: Payer: Self-pay | Admitting: *Deleted

## 2021-11-22 NOTE — Patient Outreach (Signed)
  Care Coordination   11/22/2021 Name: [name] MRN: [id number] DOB: Mar 06, 1940   Care Coordination Outreach Attempts:  A second unsuccessful outreach was attempted today to offer the patient with information about available care coordination services as a benefit of their health plan.     Follow Up Plan:  Additional outreach attempts will be made to offer the patient care coordination information and services.   Encounter Outcome:  No Answer  Care Coordination Interventions Activated:  No   Care Coordination Interventions:  No, not indicated    SIG Blain Hunsucker L. Lavina Hamman, RN, BSN, [place] Coordinator Office number [phone]

## 2021-11-24 ENCOUNTER — Ambulatory Visit: Payer: Medicare Other

## 2021-11-25 ENCOUNTER — Encounter: Payer: Self-pay | Admitting: Nurse Practitioner

## 2021-11-25 ENCOUNTER — Telehealth (INDEPENDENT_AMBULATORY_CARE_PROVIDER_SITE_OTHER): Payer: Medicare Other | Admitting: Nurse Practitioner

## 2021-11-25 DIAGNOSIS — J069 Acute upper respiratory infection, unspecified: Secondary | ICD-10-CM | POA: Diagnosis not present

## 2021-11-25 DIAGNOSIS — U071 COVID-19: Secondary | ICD-10-CM | POA: Diagnosis not present

## 2021-11-25 MED ORDER — BENZONATATE 100 MG PO CAPS: 100.0000 mg | ORAL_CAPSULE | Freq: Three times a day (TID) | ORAL | 0 refills | Status: DC | PRN

## 2021-11-25 MED ORDER — MOLNUPIRAVIR EUA 200MG CAPSULE: 4.0000 | ORAL_CAPSULE | Freq: Two times a day (BID) | ORAL | 0 refills | Status: DC

## 2021-11-25 MED ORDER — GUAIFENESIN ER 600 MG PO TB12: 600.0000 mg | ORAL_TABLET | Freq: Two times a day (BID) | ORAL | 0 refills | Status: DC

## 2021-11-25 MED ORDER — MOLNUPIRAVIR EUA 200MG CAPSULE: 4.0000 | ORAL_CAPSULE | Freq: Two times a day (BID) | ORAL | 0 refills | Status: AC

## 2021-11-25 NOTE — Addendum Note (Signed)
Addended by: Ivy Lynn on: 11/25/2021 10:44 AM   Modules accepted: Orders

## 2021-11-25 NOTE — Progress Notes (Signed)
   Virtual Visit  Note Due to COVID-19 pandemic this visit was conducted virtually. This visit type was conducted due to national recommendations for restrictions regarding the COVID-19 Pandemic (e.g. social distancing, sheltering in place) in an effort to limit this patient's exposure and mitigate transmission in our community. All issues noted in this document were discussed and addressed.  A physical exam was not performed with this format.  I connected with [name] on 11/25/21 at 10:20 am  by telephone and verified that I am speaking with the correct person using two identifiers. [name] is currently located at home with daughter present during visit. The provider, Ivy Lynn, NP is located in their office at time of visit.  I discussed the limitations, risks, security and privacy concerns of performing an evaluation and management service by telephone and the availability of in person appointments. I also discussed with the patient that there may be a patient responsible charge related to this service. The patient expressed understanding and agreed to proceed.   History and Present Illness:  URI  Associated symptoms include congestion and coughing. Pertinent negatives include no rash or wheezing.      Review of Systems  Constitutional:  Positive for chills. Negative for fever.  HENT:  Positive for congestion.   Eyes: Negative.   Respiratory:  Positive for cough. Negative for shortness of breath and wheezing.   Cardiovascular: Negative.   Genitourinary: Negative.   Musculoskeletal:  Negative for myalgias.  Skin: Negative.  Negative for itching and rash.  All other systems reviewed and are negative.    Observations/Objective: Televisit patient not in distress  Assessment and Plan: Patient presents with upper respiratory infection symptoms, cough, chills, headache and congestion.  At home COVID-19 test positive for COVID infection.  Education provided to  patient, Advised patient to take medication as prescribed, -Molnupirivir antiviral medication sent to pharmacy. -Tessalon Perles for cough -Mucinex 600 mg tablet. Take meds as prescribed - Use a cool mist humidifier  -Use saline nose sprays frequently -Force fluids -For fever or aches or pains- take Tylenol or ibuprofen.  Follow Up Instructions: Follow-up with worsening unresolved symptoms    I discussed the assessment and treatment plan with the patient. The patient was provided an opportunity to ask questions and all were answered. The patient agreed with the plan and demonstrated an understanding of the instructions.   The patient was advised to call back or seek an in-person evaluation if the symptoms worsen or if the condition fails to improve as anticipated.  The above assessment and management plan was discussed with the patient. The patient verbalized understanding of and has agreed to the management plan. Patient is aware to call the clinic if symptoms persist or worsen. Patient is aware when to return to the clinic for a follow-up visit. Patient educated on when it is appropriate to go to the emergency department.   Time call ended:  10:32 am  I provided 12 minutes of  non face-to-face time during this encounter.    Ivy Lynn, NP

## 2021-11-29 ENCOUNTER — Ambulatory Visit: Payer: Medicare Other

## 2021-11-30 ENCOUNTER — Ambulatory Visit: Payer: Self-pay | Admitting: *Deleted

## 2021-11-30 NOTE — Patient Outreach (Unsigned)
  Care Coordination   Follow Up Visit Note   11/30/2021 Name: [name] MRN: [id number] DOB: 05-Oct-1940  [name] is a 81 y.o. year old female who sees Ivy Lynn, NP for primary care. I spoke with  [name] by phone today.  What matters to the patients health and wellness today?  Feeling better still coughing white phl throwing up less each Headache still have but decrease since on covid med is HA r/t uri issues need possible improved asthma uri management  Confirm completed 5 day worth of medicine Enouraged fluids humidifier, saline nasal spray and mucinex Discuss her ability to reschedule her chirapractor appt but to maintain covid precautions Review precautions      Goals Addressed   None     SDOH assessments and interventions completed:  Yes{THN Tip this will not be part of the note when signed-REQUIRED REPORT FIELD DO NOT DELETE (Optional):27901}     Care Coordination Interventions Activated:  Yes {THN Tip this will not be part of the note when signed-REQUIRED REPORT FIELD DO NOT DELETE (Optional):27901} Care Coordination Interventions:  Yes, provided {THN Tip this will not be part of the note when signed-REQUIRED REPORT FIELD DO NOT DELETE (Optional):27901}  Follow up plan: Follow up call scheduled for 12/07/21    Encounter Outcome:  Pt. Visit Completed {THN Tip this will not be part of the note when signed-REQUIRED REPORT FIELD DO NOT DELETE (Optional):27901}  [name] L. Lavina Hamman, RN, BSN, [place] Coordinator Office number [phone]

## 2021-12-01 ENCOUNTER — Ambulatory Visit (INDEPENDENT_AMBULATORY_CARE_PROVIDER_SITE_OTHER): Payer: Medicare Other

## 2021-12-01 DIAGNOSIS — I4819 Other persistent atrial fibrillation: Secondary | ICD-10-CM

## 2021-12-04 LAB — CUP PACEART REMOTE DEVICE CHECK
Date Time Interrogation Session: 20231013230757
Implantable Pulse Generator Implant Date: 20220210

## 2021-12-08 NOTE — Progress Notes (Signed)
Carelink Summary Report / Loop Recorder 

## 2021-12-14 ENCOUNTER — Other Ambulatory Visit: Payer: Self-pay

## 2021-12-14 ENCOUNTER — Emergency Department (HOSPITAL_COMMUNITY)
Admission: EM | Admit: 2021-12-14 | Discharge: 2021-12-14 | Disposition: A | Discharge status: Home / Self Care | Payer: Medicare Other | Attending: Emergency Medicine | Admitting: Emergency Medicine

## 2021-12-14 ENCOUNTER — Telehealth: Payer: Self-pay | Admitting: Cardiology

## 2021-12-14 ENCOUNTER — Emergency Department (HOSPITAL_COMMUNITY): Payer: Medicare Other

## 2021-12-14 ENCOUNTER — Encounter (HOSPITAL_COMMUNITY): Payer: Self-pay

## 2021-12-14 DIAGNOSIS — R0789 Other chest pain: Secondary | ICD-10-CM | POA: Diagnosis not present

## 2021-12-14 DIAGNOSIS — Z79899 Other long term (current) drug therapy: Secondary | ICD-10-CM | POA: Insufficient documentation

## 2021-12-14 DIAGNOSIS — R0602 Shortness of breath: Secondary | ICD-10-CM | POA: Diagnosis not present

## 2021-12-14 DIAGNOSIS — R079 Chest pain, unspecified: Secondary | ICD-10-CM | POA: Diagnosis not present

## 2021-12-14 DIAGNOSIS — Z7982 Long term (current) use of aspirin: Secondary | ICD-10-CM | POA: Insufficient documentation

## 2021-12-14 DIAGNOSIS — I480 Paroxysmal atrial fibrillation: Secondary | ICD-10-CM | POA: Insufficient documentation

## 2021-12-14 DIAGNOSIS — I639 Cerebral infarction, unspecified: Secondary | ICD-10-CM | POA: Diagnosis not present

## 2021-12-14 DIAGNOSIS — U071 COVID-19: Secondary | ICD-10-CM | POA: Insufficient documentation

## 2021-12-14 DIAGNOSIS — I509 Heart failure, unspecified: Secondary | ICD-10-CM | POA: Insufficient documentation

## 2021-12-14 DIAGNOSIS — I4891 Unspecified atrial fibrillation: Secondary | ICD-10-CM | POA: Diagnosis not present

## 2021-12-14 DIAGNOSIS — I110 Hypertensive heart disease with heart failure: Secondary | ICD-10-CM | POA: Diagnosis not present

## 2021-12-14 DIAGNOSIS — R519 Headache, unspecified: Secondary | ICD-10-CM | POA: Diagnosis not present

## 2021-12-14 LAB — CBC
HCT: 36.7 % (ref 36.0–46.0)
Hemoglobin: 12.2 g/dL (ref 12.0–15.0)
MCH: 29.6 pg (ref 26.0–34.0)
MCHC: 33.2 g/dL (ref 30.0–36.0)
MCV: 89.1 fL (ref 80.0–100.0)
Platelets: 207 10*3/uL (ref 150–400)
RBC: 4.12 MIL/uL (ref 3.87–5.11)
RDW: 12.4 % (ref 11.5–15.5)
WBC: 5.7 10*3/uL (ref 4.0–10.5)
nRBC: 0 % (ref 0.0–0.2)

## 2021-12-14 LAB — BASIC METABOLIC PANEL
Anion gap: 6 (ref 5–15)
BUN: 11 mg/dL (ref 8–23)
CO2: 28 mmol/L (ref 22–32)
Calcium: 9.4 mg/dL (ref 8.9–10.3)
Chloride: 103 mmol/L (ref 98–111)
Creatinine, Ser: 0.68 mg/dL (ref 0.44–1.00)
GFR, Estimated: >60 mL/min (ref >60)
Glucose, Bld: 162 mg/dL — ABNORMAL HIGH (ref 70–99)
Potassium: 4 mmol/L (ref 3.5–5.1)
Sodium: 137 mmol/L (ref 135–145)

## 2021-12-14 LAB — TROPONIN I (HIGH SENSITIVITY)
Troponin I (High Sensitivity): 4 ng/L (ref <18)
Troponin I (High Sensitivity): 4 ng/L (ref <18)

## 2021-12-14 LAB — RESP PANEL BY RT-PCR (FLU A&B, COVID) ARPGX2
Influenza A by PCR: NEGATIVE
Influenza B by PCR: NEGATIVE
SARS Coronavirus 2 by RT PCR: POSITIVE — AB

## 2021-12-14 MED ORDER — ACETAMINOPHEN 325 MG PO TABS
650.0000 mg | ORAL_TABLET | Freq: Once | ORAL | Status: AC
  Administered 2021-12-14: 650 mg via ORAL
  Filled 2021-12-14: qty 2

## 2021-12-14 NOTE — Telephone Encounter (Signed)
Pt c/o of Chest Pain: STAT if CP now or developed within 24 hours  1. Are you having CP right now? No  2. Are you experiencing any other symptoms (ex. SOB, nausea, vomiting, sweating)? SOB   3. How long have you been experiencing CP? Friday of last week. 10/20  4. Is your CP continuous or coming and going? Coming and going with pressure   5. Have you taken Nitroglycerin? No  She states that this is like a pressure that comes and goes. She states that this reminds her of the time she had a blockage previously. Please advise.  ?

## 2021-12-14 NOTE — ED Provider Notes (Signed)
[facility] EMERGENCY DEPARTMENT Provider Note   CSN: [id number] Arrival date & time: 12/14/21  1225     History  Chief Complaint  Patient presents with   Chest Pain   Shortness of Breath    [name] is a 81 y.o. female past medical history seen for hypertension, hyperlipidemia, A-fib, RVR, persistent A-fib, CHF, with recent COVID in the last 3 weeks who presents with concern for chest pain, shortness of breath, coughing and headache that she describes as feeling like squeezing.  Patient denies any unilateral numbness, weakness, confusion, facial droop, dysarthria.  Patient reports that at home she was more worried that her A-fib was acting up, with having some exertional chest tightness, cough, and shortness of breath, reports that at rest she feels overall okay.  Patient reports that she called her cardiologist/PCP this morning and they encouraged her to come to the emergency department based on the symptoms she is describing.  Patient reports she has not missed any doses of her medication, she takes 240 mg diltiazem, Eliquis, metoprolol. Reports no chest pain at time of my evaluation.   Chest Pain Associated symptoms: shortness of breath   Shortness of Breath Associated symptoms: chest pain        Home Medications Prior to Admission medications   Medication Sig Start Date End Date Taking? Authorizing Provider  acetaminophen (TYLENOL) 500 MG tablet Take 1,000 mg by mouth every 6 (six) hours as needed for moderate pain.   Yes [provider]  Ascorbic Acid (VITAMIN C) 500 MG tablet Take 500 mg by mouth daily.     Yes [provider]  bimatoprost (LUMIGAN) 0.01 % SOLN Place 1 drop into both eyes at bedtime.   Yes [provider]  Calcium Carbonate Antacid (TUMS PO) Take 2 tablets by mouth 4 (four) times daily as needed (for acid reflux/indigestion).    Yes [provider]  Camphor-Menthol-Methyl Sal (SALONPAS) 3.02-26-08 % PTCH Apply 1 patch  topically daily as needed (pain).   Yes [provider]  cetirizine (ZYRTEC) 10 MG tablet Take 10 mg by mouth daily as needed for allergies.   Yes [provider]  Cyanocobalamin (VITAMIN B 12 PO) Take 1 tablet by mouth daily.   Yes [provider]  diclofenac Sodium (VOLTAREN) 1 % GEL Apply 2 g topically 4 (four) times daily. Patient taking differently: Apply 2 g topically 4 (four) times daily as needed (pain). 01/09/20  Yes Ivy Lynn, NP  diltiazem (CARDIZEM CD) 240 MG 24 hr capsule TAKE ONE CAPSULE BY MOUTH DAILY 11/10/21  Yes Satira Sark, MD  ELIQUIS 5 MG TABS tablet TAKE ONE TABLET BY MOUTH TWICE DAILY 09/19/21  Yes Satira Sark, MD  GARLIC PO Take 1 tablet by mouth daily.   Yes [provider]  guaiFENesin (MUCINEX) 600 MG 12 hr tablet Take 1 tablet (600 mg total) by mouth 2 (two) times daily. 11/25/21  Yes Ivy Lynn, NP  HYDROcodone-acetaminophen (NORCO) 5-325 MG tablet Take 1 tablet by mouth every 6 (six) hours as needed for moderate pain. 08/24/21  Yes Ivy Lynn, NP  levothyroxine (SYNTHROID) 50 MCG tablet Take 1 tablet (50 mcg total) by mouth daily before breakfast. 08/09/21  Yes Brita Romp, NP  losartan (COZAAR) 50 MG tablet Take 1 tablet (50 mg total) by mouth daily. 01/12/21  Yes Emokpae, Courage, MD  MAGNESIUM CARBONATE PO Take 1 tablet by mouth at bedtime.   Yes [provider]  metoprolol  succinate (TOPROL XL) 100 MG 24 hr tablet Take 1 tablet (100 mg total) by mouth daily. Take with or immediately following a meal. 01/12/21 01/12/22 Yes Emokpae, Courage, MD  nitroGLYCERIN (NITROSTAT) 0.4 MG SL tablet Place 0.4 mg under the tongue every 5 (five) minutes as needed for chest pain.   Yes [provider]  promethazine (PHENERGAN) 25 MG tablet TAKE 1 TABLET EVERY 12 HOURS AS NEEDED FOR NAUSEA Patient taking differently: Take 25 mg by mouth every 12 (twelve) hours as needed for nausea or vomiting.  04/11/21  Yes Ivy Lynn, NP  rosuvastatin (CRESTOR) 20 MG tablet TAKE ONE (1) TABLET BY MOUTH EVERY DAY 09/19/21  Yes Satira Sark, MD  timolol (TIMOPTIC) 0.5 % ophthalmic solution Place 1 drop into both eyes daily.   Yes [provider]  traZODone (DESYREL) 150 MG tablet Take 1 tablet (150 mg total) by mouth at bedtime. 06/24/21  Yes Ivy Lynn, NP  TURMERIC PO Take 1 tablet by mouth daily.   Yes [provider]  VENTOLIN HFA 108 (90 Base) MCG/ACT inhaler Inhale 1-2 puffs into the lungs every 4 (four) hours as needed for shortness of breath. 01/12/21  Yes Emokpae, Courage, MD  Vitamin D, Cholecalciferol, 10 MCG (400 UNIT) CAPS Take 400 Units by mouth daily.   Yes [provider]  benzonatate (TESSALON PERLES) 100 MG capsule Take 1 capsule (100 mg total) by mouth 3 (three) times daily as needed. Patient not taking: Reported on 12/14/2021 11/25/21   Ivy Lynn, NP  lansoprazole (PREVACID) 30 MG capsule Take 1 capsule (30 mg total) by mouth 2 (two) times daily for 14 days. Patient not taking: Reported on 12/14/2021 09/26/21 10/10/21  Daneil Dolin, MD  pantoprazole (PROTONIX) 40 MG tablet Take 1 tablet (40 mg total) by mouth daily. 30 minutes before eating Patient not taking: Reported on 12/14/2021 08/18/21   Annitta Needs, NP      Allergies    Patient has no known allergies.    Review of Systems   Review of Systems  Respiratory:  Positive for shortness of breath.   Cardiovascular:  Positive for chest pain.  All other systems reviewed and are negative.   Physical Exam Updated Vital Signs BP (!) 151/91   Pulse 61   Temp 98.3 F (36.8 C) (Oral)   Resp 17   Ht '5\' 4"'$  (1.626 m)   Wt 75.1 kg   LMP  (LMP Unknown)   SpO2 100%   BMI 28.43 kg/m  Physical Exam Vitals and nursing note reviewed.  Constitutional:      General: She is not in acute distress.    Appearance: Normal appearance.  HENT:     Head: Normocephalic and atraumatic.  Eyes:      General:        Right eye: No discharge.        Left eye: No discharge.  Cardiovascular:     Rate and Rhythm: Normal rate and regular rhythm.     Heart sounds: No murmur heard.    No friction rub. No gallop.  Pulmonary:     Effort: Pulmonary effort is normal.     Breath sounds: Normal breath sounds.  Chest:     Comments: No significant TTP of chest wall Abdominal:     General: Bowel sounds are normal.     Palpations: Abdomen is soft.  Skin:    General: Skin is warm and dry.     Capillary Refill: Capillary  refill takes less than 2 seconds.  Neurological:     Mental Status: She is alert and oriented to person, place, and time.     Comments: Cranial nerves II through XII grossly intact.  Intact finger-nose, intact heel-to-shin.  Romberg negative, gait normal.  Alert and oriented x3.  Moves all 4 limbs spontaneously, normal coordination.  No pronator drift.  Intact strength 5 out of 5 bilateral upper and lower extremities.    Psychiatric:        Mood and Affect: Mood normal.        Behavior: Behavior normal.     ED Results / Procedures / Treatments   Labs (all labs ordered are listed, but only abnormal results are displayed) Labs Reviewed  BASIC METABOLIC PANEL - Abnormal; Notable for the following components:      Result Value   Glucose, Bld 162 (*)    All other components within normal limits  RESP PANEL BY RT-PCR (FLU A&B, COVID) ARPGX2  CBC  TROPONIN I (HIGH SENSITIVITY)  TROPONIN I (HIGH SENSITIVITY)    EKG None  Radiology CT Head Wo Contrast  Result Date: 12/14/2021 CLINICAL DATA:  Acute stroke.  COVID last month. EXAM: CT HEAD WITHOUT CONTRAST TECHNIQUE: Contiguous axial images were obtained from the base of the skull through the vertex without intravenous contrast. RADIATION DOSE REDUCTION: This exam was performed according to the departmental dose-optimization program which includes automated exposure control, adjustment of the mA and/or kV according to  patient size and/or use of iterative reconstruction technique. COMPARISON:  None Available. FINDINGS: Brain: No acute intracranial hemorrhage. No focal mass lesion. No CT evidence of acute infarction. No midline shift or mass effect. No hydrocephalus. Basilar cisterns are patent. Minimal periventricular and subcortical white matter hypodensities. Minimal generalized cortical atrophy. Vascular: No hyperdense vessel or unexpected calcification. Skull: Normal. Negative for fracture or focal lesion. Sinuses/Orbits: Paranasal sinuses and mastoid air cells are clear. Orbits are clear. Other: None. IMPRESSION: No acute intracranial findings. Electronically Signed   By: Suzy Bouchard M.D.   On: 12/14/2021 16:21   DG Chest 2 View  Result Date: 12/14/2021 CLINICAL DATA:  Chest pain, shortness of breath, and headache since Thursday. EXAM: CHEST - 2 VIEW COMPARISON:  Chest radiograph 01/09/2021; CT heart 02/10/2021 FINDINGS: Stable cardiomediastinal silhouette. Coronary artery stents. Loop recorder overlies the left chest wall. No focal consolidation, pleural effusion, or pneumothorax. Multilevel degenerative changes in the thoracic spine. IMPRESSION: No acute cardiopulmonary abnormality. Electronically Signed   By: Ileana Roup M.D.   On: 12/14/2021 13:11    Procedures Procedures    Medications Ordered in ED Medications  acetaminophen (TYLENOL) tablet 650 mg (650 mg Oral Given 12/14/21 1750)    ED Course/ Medical Decision Making/ A&P                           Medical Decision Making Amount and/or Complexity of Data Reviewed Labs: ordered. Radiology: ordered.  Risk OTC drugs.   This patient is a 81 y.o. female who presents to the ED for concern of chest pain, this involves an extensive number of treatment options, and is a complaint that carries with it a high risk of complications and morbidity. The emergent differential diagnosis prior to evaluation includes, but is not limited to,  ACS, AAS,  PE, Mallory-Weiss, Boerhaave's, Pneumonia, acute bronchitis, asthma or COPD exacerbation, anxiety, MSK pain or traumatic injury to the chest, acid reflux versus other .   This is  not an exhaustive differential.   Past Medical History / Co-morbidities / Social History: Hyperlipidemia, hypertension, A-fib, CHF  Additional history: Chart reviewed. Pertinent results include: Reviewed recent outpatient cardiology, family medicine visits as well as gastro visits  Physical Exam: Physical exam performed. The pertinent findings include: On exam patient with no neurologic abnormalities on my exam, no significant tenderness palpation of the chest or abdomen  Lab Tests: I ordered, and personally interpreted labs.  The pertinent results include: BMP notable for mild hyperglycemia, glucose 162, troponin negative x2, CBC unremarkable, no leukocytosis or anemia.   Imaging Studies: I ordered imaging studies including CT head without contrast, plain film chest x-ray. I independently visualized and interpreted imaging which showed no acute intracranial or intrathoracic abnormality. I agree with the radiologist interpretation.   Cardiac Monitoring:  The patient was maintained on a cardiac monitor.  My attending physician Dr. Matilde Sprang viewed and interpreted the cardiac monitored which showed an underlying rhythm of: NSR, occasional PACs. I agree with this interpretation.   Medications: I ordered medication including tylenol  for headache. Reevaluation of the patient after these medicines showed that the patient stayed the same. I have reviewed the patients home medicines and have made adjustments as needed.  Disposition: After consideration of the diagnostic results and the patients response to treatment, I feel that the patient came in with some chest pain which is high risk for a patient with past medical history significant for NSTEMI she does not describe exertional chest pain, or really cardiac sounding  chest pain, more describing a chest tightness, she is having some cough, congestion, she did have COVID in the last month, with concern for some postviral cough syndrome, chest congestion but no acute abnormalities today, no evidence of stroke, headache is likely secondary to coughing feeling of heart racing may be secondary to her paroxysmal A-fib, but she is not in A-fib at this time, encouraged over-the-counter cough cold medication, follow-up with PCP and cardiologist.   emergency department workup does not suggest an emergent condition requiring admission or immediate intervention beyond what has been performed at this time. The plan is: as above. The patient is safe for discharge and has been instructed to return immediately for worsening symptoms, change in symptoms or any other concerns.  I discussed this case with my attending physician Dr. Matilde Sprang who cosigned this note including patient's presenting symptoms, physical exam, and planned diagnostics and interventions. Attending physician stated agreement with plan or made changes to plan which were implemented.    Final Clinical Impression(s) / ED Diagnoses Final diagnoses:  Chest tightness  Paroxysmal atrial fibrillation [facility])    Rx / DC Orders ED Discharge Orders     None         Dorien Chihuahua 12/14/21 1839    Teressa Lower, MD 12/21/21 [phone]

## 2021-12-14 NOTE — ED Triage Notes (Signed)
CC: CP, SOB, coughing, headache  Symptoms started last week. Symptoms worse on some days. Covid last month

## 2021-12-14 NOTE — Telephone Encounter (Signed)
Daughter made aware. States that she is going to take the patient to [facility] for further observation.

## 2021-12-14 NOTE — ED Notes (Signed)
Pt provided discharge instructions and prescription information. Pt was given the opportunity to ask questions and questions were answered.   

## 2021-12-14 NOTE — Telephone Encounter (Signed)
Patient and her daughter called stating that since last Friday, patient has been having intermittent chest pains and flutter, tightness, headache, fatigue and SOB. States that when she has the chest pains, they are rated at 8 or 9/10. Medication list reviewed with patient and when asked if she needed to take her nitro, states that she did pull the nitro out but did not take any. Asked patient to take her BP while she was on the phone but states that she misplaced her machine therefore she was unable get a reading. States that she did have Covid about a month ago and is still coughing up phlegm. I asked the patient if she felt like she needed to go to the ER, states that she did not want to go because she is always diagnosed with Afib. Please advise

## 2021-12-14 NOTE — Discharge Instructions (Signed)
Continue taking your normal home medications, return to the emergency department if you have persistent chest pain, shortness of breath, and follow-up with your PCP and cardiologist to ensure resolution of your symptoms.  Recommend Tylenol, rest, fluids for your headache.  Please return if you have vision changes, numbness, tingling, facial droop, difficulty speaking.

## 2021-12-16 ENCOUNTER — Ambulatory Visit (INDEPENDENT_AMBULATORY_CARE_PROVIDER_SITE_OTHER): Payer: Medicare Other

## 2021-12-16 DIAGNOSIS — Z23 Encounter for immunization: Secondary | ICD-10-CM

## 2021-12-18 NOTE — Progress Notes (Unsigned)
Cardiology Office Note:    Date:  12/19/2021   ID:  [name], DOB 04-15-1940, MRN [id number]  PCP:  Ivy Lynn, NP   [facility] Providers Cardiologist:  Rozann Lesches, MD Electrophysiologist:  Thompson Grayer, MD     Referring MD: Ivy Lynn, NP   Chief Complaint: ED follow-up for chest pain  History of Present Illness:    [name] is a very pleasant 81 y.o. female with a hx of CAD s/p DES to LAD 2010 and 2017, s/p DES to ramus-1 and DES x 2 to RCA 02/2020,  persistent atrial fibrillation s/p ablation, HTN, type 2 diabetes, HLD, and Jehovah's witness.   Initially diagnosed with atrial fibrillation on 11/2017 after presenting with symptoms of palpitations and chest pain to ER, found to be in afib with RVR.  Discharged on Eliquis and rate control with plan to cardiovert but on follow-up she had converted on her own.  Seen in ER 11/11/2018 with symptoms of SOB, chest tightness, and palpitations for 3 days. Was found to be in afib with RVR and underwent successful DCCV.  She denied snoring or significant alcohol use. Was placed on amiodarone which controlled her afib but unfortunately she developed hypothyroidism and this was discontinued.  Cardiac monitor 12/2019 revealed predominant rhythm SR with average HR 55 bpm.  Prolonged PR interval noted.  7 episodes of SVT longest of which only 9 beats, no sustained arrhythmias or pauses, occasional PACs and rare PVCs.  She subsequently had additional episodes of afib and was referred for ablation.  Presented to [facility] ED 03/08/2020 with chest pain with troponin elevation up [place] [id number].  Transferred to [facility] and underwent cardiac cath 03/09/2020 with 2 vessel lesion PCI with stenting of ramus intermedius and mid RCA. Advised to continue triple drug therapy for 1 week and then Plavix and Eliquis for 1 year if tolerated.   She underwent A-fib ablation with Dr. Rayann Heman on 02/16/2021. There were multiple atypical atrial flutter circuits  noted.  Post ablation she had an increase in edema in her right leg up to mid thigh.  Was started on Lasix daily with only minimal improvement.  Venous U/S showed DVT in right common femoral vein.  Seen by Dr. Donzetta Matters with [facility] and recommendation was to continue Eliquis and use compression stockings with no further intervention.  She has maintained consistent follow-up with A-fib clinic.  Seen by Dr. Domenic Polite on 07/05/2021 at which time she reported more palpitations.  Her EKG revealed sinus rhythm with PACs.  He increased her Cardizem to 240 mg daily, continue Toprol XL at 100 mg daily and continue Eliquis.  She was advised to return in 6 months for follow-up.  She called our office on 12/14/2021 to report shortness of breath coming and going with pressure for the previous 5 days. Recent COVID infection, coughing up phlegm.  Reported it felt like previous angina. She did not take any ntg. Symptoms were reviewed with Dr. Domenic Polite and he advised her to go to ED for evaluation. Seen in [facility] ED on 12/14/21. Cardiac monitor revealed NSR with occasional PACs. Troponin negative x 2. CXR unrevealing. Chest pain resolved in ED.  Today, she is here for follow-up of recent ED visit. Was not aware that she tested positive for COVID on 10/25, was diagnosed with COVID about one month prior. Has been coughing more over the past few days. Reports no recurrence of chest pain since ED visit. Feels more symptoms of palpitations at night  when she settles down to rest. Could not tell an improvement in palpitations after increasing Cardizem. Feels tightness in her chest with exertion, has to use hand rail to go up and down steps. Has chronic DOE. Activity is limited by bulging disc and arthritis. She denies lower extremity edema, weakness, presyncope, syncope, orthopnea, and PND.  Past Medical History:  Diagnosis Date   Anxiety    Arthritis    Bulging lumbar disc    Chronic lower back pain    Coronary atherosclerosis of native  coronary artery    DES LAD 02/2008, DES LAD 02/2015, DES ramus and DES x2 mid RCA 02/2020   Depression    Dyslipidemia    Essential hypertension    Facial numbness    Frequent headaches    GERD (gastroesophageal reflux disease)    Glaucoma    Heart attack ([place]) 02/2020   Insomnia    Paroxysmal atrial fibrillation ([place])    Diagnosed October 2019   PSVT (paroxysmal supraventricular tachycardia)    Refusal of blood transfusions as patient is Jehovah's Witness    Thyroid disease    Type 2 diabetes mellitus ([place])    Borderline   Vitamin D deficiency     Past Surgical History:  Procedure Laterality Date   ATRIAL FIBRILLATION ABLATION N/A 02/16/2021   Procedure: ATRIAL FIBRILLATION ABLATION;  Surgeon: Thompson Grayer, MD;  Location: [place] [facility];  Service: Cardiovascular;  Laterality: N/A;   BIOPSY  09/21/2021   Procedure: BIOPSY;  Surgeon: Daneil Dolin, MD;  [facility]: [facility];  Service: Endoscopy;;  gastric   CARDIAC CATHETERIZATION N/A 02/23/2015   Procedure: Left Heart Cath and Coronary Angiography;  Surgeon: Sherren Mocha, MD;  Location: [place] [facility];  Service: Cardiovascular;  Laterality: N/A;   CARDIAC CATHETERIZATION N/A 02/23/2015   Procedure: Coronary Stent Intervention;  Surgeon: Sherren Mocha, MD;  Location: [place] [facility];  Service: Cardiovascular;  Laterality: N/A;   CARDIAC CATHETERIZATION N/A 01/04/2016   Procedure: Left Heart Cath and Coronary Angiography;  Surgeon: Peter M Martinique, MD;  Location: [place] [facility];  Service: Cardiovascular;  Laterality: N/A;   CATARACT EXTRACTION     CORONARY ANGIOPLASTY  02/23/2015   CORONARY ANGIOPLASTY WITH STENT PLACEMENT  2009   CORONARY STENT INTERVENTION N/A 03/09/2020   Procedure: CORONARY STENT INTERVENTION;  Surgeon: Jettie Booze, MD;  Location: [place] [facility];  Service: Cardiovascular;  Laterality: N/A;   DILATION AND CURETTAGE OF UTERUS     ESOPHAGOGASTRODUODENOSCOPY (EGD) WITH PROPOFOL N/A  09/21/2021   Procedure: ESOPHAGOGASTRODUODENOSCOPY (EGD) WITH PROPOFOL;  Surgeon: Daneil Dolin, MD;  [facility];  Service: Endoscopy;  Laterality: N/A;  1:00pm   implantable loop recorder placement  04/01/2020   Medtronic Reveal Linq model LNQ [id number]) implantable loop recorder    LEFT HEART CATH AND CORONARY ANGIOGRAPHY N/A 03/09/2020   Procedure: LEFT HEART CATH AND CORONARY ANGIOGRAPHY;  Surgeon: Jettie Booze, MD;  Location: [place] [facility];  Service: Cardiovascular;  Laterality: N/A;   TUBAL LIGATION      Current Medications: Current Meds  Medication Sig   acetaminophen (TYLENOL) 500 MG tablet Take 1,000 mg by mouth every 6 (six) hours as needed for moderate pain.   Ascorbic Acid (VITAMIN C) 500 MG tablet Take 500 mg by mouth daily.     bimatoprost (LUMIGAN) 0.01 % SOLN Place 1 drop into both eyes at bedtime.   Calcium Carbonate Antacid (TUMS PO) Take 2 tablets by  mouth 4 (four) times daily as needed (for acid reflux/indigestion).    Camphor-Menthol-Methyl Sal (SALONPAS) 3.02-26-08 % PTCH Apply 1 patch topically daily as needed (pain).   cetirizine (ZYRTEC) 10 MG tablet Take 10 mg by mouth daily as needed for allergies.   Cyanocobalamin (VITAMIN B 12 PO) Take 1 tablet by mouth daily.   diclofenac Sodium (VOLTAREN) 1 % GEL Apply 2 g topically 4 (four) times daily. (Patient taking differently: Apply 2 g topically 4 (four) times daily as needed (pain).)   diltiazem (CARDIZEM CD) 240 MG 24 hr capsule TAKE ONE CAPSULE BY MOUTH DAILY   ELIQUIS 5 MG TABS tablet TAKE ONE TABLET BY MOUTH TWICE DAILY   GARLIC PO Take 1 tablet by mouth daily.   guaiFENesin (MUCINEX) 600 MG 12 hr tablet Take 1 tablet (600 mg total) by mouth 2 (two) times daily.   HYDROcodone-acetaminophen (NORCO) 5-325 MG tablet Take 1 tablet by mouth every 6 (six) hours as needed for moderate pain.   levothyroxine (SYNTHROID) 50 MCG tablet Take 1 tablet (50 mcg total) by mouth daily before breakfast.    losartan (COZAAR) 50 MG tablet Take 1 tablet (50 mg total) by mouth daily.   MAGNESIUM CARBONATE PO Take 1 tablet by mouth at bedtime.   metoprolol succinate (TOPROL XL) 100 MG 24 hr tablet Take 1 tablet (100 mg total) by mouth daily. Take with or immediately following a meal.   nitroGLYCERIN (NITROSTAT) 0.4 MG SL tablet Place 0.4 mg under the tongue every 5 (five) minutes as needed for chest pain.   promethazine (PHENERGAN) 25 MG tablet TAKE 1 TABLET EVERY 12 HOURS AS NEEDED FOR NAUSEA (Patient taking differently: Take 25 mg by mouth every 12 (twelve) hours as needed for nausea or vomiting.)   rosuvastatin (CRESTOR) 20 MG tablet TAKE ONE (1) TABLET BY MOUTH EVERY DAY   timolol (TIMOPTIC) 0.5 % ophthalmic solution Place 1 drop into both eyes daily.   traZODone (DESYREL) 150 MG tablet Take 1 tablet (150 mg total) by mouth at bedtime.   TURMERIC PO Take 1 tablet by mouth daily.   VENTOLIN HFA 108 (90 Base) MCG/ACT inhaler Inhale 1-2 puffs into the lungs every 4 (four) hours as needed for shortness of breath.   Vitamin D, Cholecalciferol, 10 MCG (400 UNIT) CAPS Take 400 Units by mouth daily.   [DISCONTINUED] benzonatate (TESSALON PERLES) 100 MG capsule Take 1 capsule (100 mg total) by mouth 3 (three) times daily as needed.   [DISCONTINUED] pantoprazole (PROTONIX) 40 MG tablet Take 1 tablet (40 mg total) by mouth daily. 30 minutes before eating     Allergies:   Patient has no known allergies.   Social History   Socioeconomic History   Marital status: Widowed    Spouse name: Not on file   Number of children: 8   Years of education: 10th grade   Highest education level: 10th grade  Occupational History   Occupation: RETIRED  Tobacco Use   Smoking status: Never   Smokeless tobacco: Never   Tobacco comments:    Never smoke 11/15/21  Vaping Use   Vaping Use: Never used  Substance and Sexual Activity   Alcohol use: Not Currently    Alcohol/week: 1.0 standard drink of alcohol    Types: 1  Glasses of wine per week    Comment: RARE OCCASION   Drug use: No   Sexual activity: Not Currently  Other Topics Concern   Not on file  Social History Narrative   Lives at  home alone.   Right-handed.   No caffeine use.   Daughter lives next door and helps her out   Social Determinants of Health   Financial Resource Strain: Low Risk  (11/08/2021)   Overall Financial Resource Strain (CARDIA)    Difficulty of Paying Living Expenses: Not hard at all  Food Insecurity: No Food Insecurity (11/08/2021)   Hunger Vital Sign    Worried About Running Out of Food in the Last Year: Never true    Ran Out of Food in the Last Year: Never true  Transportation Needs: No Transportation Needs (11/08/2021)   PRAPARE - Hydrologist (Medical): No    Lack of Transportation (Non-Medical): No  Physical Activity: Inactive (02/26/2020)   Exercise Vital Sign    Days of Exercise per Week: 0 days    Minutes of Exercise per Session: 0 min  Stress: No Stress Concern Present (11/08/2021)   [place]    Feeling of Stress : Only a little  Social Connections: Moderately Integrated (02/26/2020)   Social Connection and Isolation Panel [NHANES]    Frequency of Communication with Friends and Family: More than three times a week    Frequency of Social Gatherings with Friends and Family: More than three times a week    Attends Religious Services: More than 4 times per year    Active Member of [employer] or Organizations: Yes    Attends Archivist Meetings: More than 4 times per year    Marital Status: Widowed     Family History: The patient's family history includes Cancer in her father; Colon cancer in her daughter; Heart disease in her mother; Other in her mother.  ROS:   Please see the history of present illness.  All other systems reviewed and are negative.  Labs/Other Studies Reviewed:    The following studies were  reviewed today:  Echo 01/10/21  1. Left ventricular ejection fraction, by estimation, is 55 to 60%. The  left ventricle has normal function. The left ventricle has no regional  wall motion abnormalities. There is mild left ventricular hypertrophy.  Left ventricular diastolic parameters  are indeterminate.   2. Right ventricular systolic function is normal. The right ventricular  size is normal. There is moderately elevated pulmonary artery systolic  pressure.   3. Left atrial size was mildly dilated.   4. The mitral valve is normal in structure. No evidence of mitral valve  regurgitation. No evidence of mitral stenosis. Moderate mitral annular  calcification.   5. The tricuspid valve is abnormal. Tricuspid valve regurgitation is  moderate.   6. The aortic valve is tricuspid. There is moderate calcification of the  aortic valve. There is moderate thickening of the aortic valve. Aortic  valve regurgitation is mild. No aortic stenosis is present.   7. The inferior vena cava is dilated in size with >50% respiratory  variability, suggesting right atrial pressure of 8 mmHg.   [place] 03/09/20  Prior LAD stents were patient. Ramus-1 lesion is 95% stenosed. A drug-eluting stent was successfully placed using a STENT RESOLUTE ONYX 2.5X22, postdilated to 2.75 mm. T Post intervention, there is a 0% residual stenosis. Ramus-2 lesion is 50% stenosed in tortuous segment distal to the stent. Post intervention, there is a 0% residual stenosis. Mid RCA-1 lesion is 75% stenosed. A drug-eluting stent was successfully placed using a STENT RESOLUTE ONYX 2.5X26, postdilated to 2.75 mm. Post intervention, there is a 0% residual  stenosis. Mid RCA-2 lesion is 75% stenosed, likely edge dissection in tortuous segment. A drug-eluting stent was successfully placed using a STENT RESOLUTE ONYX 2.5X8, A drug-eluting stent was successfully placed using a STENT RESOLUTE ONYX 2.5X8, postdilated to 2.75 mm. Ost Cx to Prox  Cx lesion is 50% stenosed. The left ventricular systolic function is normal. LV end diastolic pressure is mildly elevated. The left ventricular ejection fraction is 50-55% by visual estimate. There is no aortic valve stenosis. A drug-eluting stent was successfully placed using a STENT RESOLUTE ONYX 2.5X22. A drug-eluting stent was successfully placed using a STENT RESOLUTE ONYX 2.5X26. A drug-eluting stent was successfully placed using a STENT RESOLUTE ONYX 2.5X8.   Restart Eliquis tomorrow.  Aspirin for only 1 week since I am concerned about her bleeding risk.  Clopidogrel for 1 year if tolerated from a bleeding standpoint.  At minimum, would want at least 6 months of clopidogrel.       Diagnostic Dominance: Right  Intervention     Recent Labs: 01/09/2021: B Natriuretic Peptide 1,296.0 04/15/2021: ALT 10 07/26/2021: TSH 0.529 12/14/2021: BUN 11; Creatinine, Ser 0.68; Hemoglobin 12.2; Platelets 207; Potassium 4.0; Sodium 137  Recent Lipid Panel    Component Value Date/Time   CHOL 109 04/15/2021 0935   TRIG 103 04/15/2021 0935   HDL 46 04/15/2021 0935   CHOLHDL 2.4 04/15/2021 0935   CHOLHDL 2.4 03/09/2020 0209   VLDL 12 03/09/2020 0209   LDLCALC 44 04/15/2021 0935     Risk Assessment/Calculations:    CHA2DS2-VASc Score = 6  {This indicates a 9.7% annual risk of stroke. The patient's score is based upon: CHF History: 1 HTN History: 1 Diabetes History: 0 Stroke History: 0 Vascular Disease History: 1 Age Score: 2 Gender Score: 1    Physical Exam:    VS:  BP 116/60   Pulse 70   Ht '5\' 4"'$  (1.626 m)   Wt 165 lb (74.8 kg)   LMP  (LMP Unknown)   SpO2 96%   BMI 28.32 kg/m     Wt Readings from Last 3 Encounters:  12/19/21 165 lb (74.8 kg)  12/14/21 165 lb 9.6 oz (75.1 kg)  11/15/21 164 lb (74.4 kg)     GEN:  Well nourished, well developed in no acute distress HEENT: Normal NECK: No JVD; No carotid bruits CARDIAC: Irregular RR, no murmurs, rubs,  gallops RESPIRATORY:  Clear to auscultation without rales, wheezing or rhonchi  ABDOMEN: Soft, non-tender, non-distended MUSCULOSKELETAL:  No edema; No deformity. 2+ pedal pulses, equal bilaterally SKIN: Warm and dry NEUROLOGIC:  Alert and oriented x 3 PSYCHIATRIC:  Normal affect   EKG:  EKG is not ordered today.   Diagnoses:    1. Coronary artery disease involving native coronary artery of native heart without angina pectoris   2. Secondary hypercoagulable state ([place])   3. Persistent atrial fibrillation ([place])   4. Hyperlipidemia LDL goal <70   5. Chest pain of uncertain etiology    Assessment and Plan:     Chest pain: Recent chest pain on 10/25 for which she went to ED. Troponin, CXR, EKG normal. [name] today. Recent coughing 2/2 COVID infection. Advised her to monitor for exertional dyspnea or chest discomfort and to notify us if symptoms worsen prior to appointment 01/05/22 with Dr. Domenic Polite.   CAD without angina: Symptoms of chest pain occurred on 10/25 concerning for angina. [name] today. Negative troponin in ED. Reports chronic dyspnea that is stable. No edema, orthopnea, or PND.  Not on antiplatelet due to [facility]. Continue rosuvastatin, metoprolol.   Persistent atrial fibrillation s/p ablation: Rate is well-controlled. Occasional palpitations, worse at night. Feels that DOE is symptom of a fib. Does not understand why she continues to have a fib s/p ablation. Has been followed by A-fib clinic. Has appointment in December with Dr. Myles Gip in [place].  No bleeding problems on Eliquis, dose is appropriate for age/weight/creatinine. Continue metoprolol, diltiazem, Eliquis.  Hyperlipidemia LDL goal < 70: LDL 44 on 04/15/21. Continue rosuvastatin.   Disposition: Keep your November appointment with Dr. Domenic Polite and your December appointment with Dr. Myles Gip  Medication Adjustments/Labs and Tests Ordered: Current medicines are reviewed at length with the patient today.  Concerns  regarding medicines are outlined above.  No orders of the defined types were placed in this encounter.  No orders of the defined types were placed in this encounter.   Patient Instructions  Medication Instructions:   Your physician recommends that you continue on your current medications as directed. Please refer to the Current Medication list given to you today.   *If you need a refill on your cardiac medications before your next appointment, please call your pharmacy*   Lab Work:  None ordered.  If you have labs (blood work) drawn today and your tests are completely normal, you will receive your results only by: [facility] (if you have MyChart) OR A paper copy in the mail If you have any lab test that is abnormal or we need to change your treatment, we will call you to review the results.   Testing/Procedures:  None ordered.   Follow-Up: At [facility], you and your health needs are our priority.  As part of our continuing mission to provide you with exceptional heart care, we have created designated Provider Care Teams.  These Care Teams include your primary Cardiologist (physician) and Advanced Practice Providers (APPs -  Physician Assistants and Nurse Practitioners) who all work together to provide you with the care you need, when you need it.  We recommend signing up for the patient portal called "MyChart".  Sign up information is provided on this After Visit Summary.  MyChart is used to connect with patients for Virtual Visits (Telemedicine).  Patients are able to view lab/test results, encounter notes, upcoming appointments, etc.  Non-urgent messages can be sent to your provider as well.   To learn more about what you can do with MyChart, go to [email or web address].    Your next appointment:   1 month(s)  The format for your next appointment:   In Person  Provider:   Rozann Lesches, MD    Other Instructions  Keep your Dr. Myles Gip appointment.    Important Information About Sugar         Signed, Emmaline Life, NP  12/19/2021 2:26 PM    [place]

## 2021-12-19 ENCOUNTER — Ambulatory Visit: Payer: Medicare Other | Attending: Nurse Practitioner | Admitting: Nurse Practitioner

## 2021-12-19 ENCOUNTER — Encounter: Payer: Self-pay | Admitting: Nurse Practitioner

## 2021-12-19 VITALS — BP 116/60 | HR 70 | Ht 64.0 in | Wt 165.0 lb

## 2021-12-19 DIAGNOSIS — R079 Chest pain, unspecified: Secondary | ICD-10-CM

## 2021-12-19 DIAGNOSIS — D6869 Other thrombophilia: Secondary | ICD-10-CM

## 2021-12-19 DIAGNOSIS — I2510 Atherosclerotic heart disease of native coronary artery without angina pectoris: Secondary | ICD-10-CM | POA: Diagnosis not present

## 2021-12-19 DIAGNOSIS — I4819 Other persistent atrial fibrillation: Secondary | ICD-10-CM | POA: Diagnosis not present

## 2021-12-19 DIAGNOSIS — E785 Hyperlipidemia, unspecified: Secondary | ICD-10-CM | POA: Diagnosis not present

## 2021-12-19 NOTE — Patient Instructions (Signed)
Medication Instructions:   Your physician recommends that you continue on your current medications as directed. Please refer to the Current Medication list given to you today.   *If you need a refill on your cardiac medications before your next appointment, please call your pharmacy*   Lab Work:  None ordered.  If you have labs (blood work) drawn today and your tests are completely normal, you will receive your results only by: [place] (if you have MyChart) OR A paper copy in the mail If you have any lab test that is abnormal or we need to change your treatment, we will call you to review the results.   Testing/Procedures:  None ordered.   Follow-Up: At [facility], you and your health needs are our priority.  As part of our continuing mission to provide you with exceptional heart care, we have created designated Provider Care Teams.  These Care Teams include your primary Cardiologist (physician) and Advanced Practice Providers (APPs -  Physician Assistants and Nurse Practitioners) who all work together to provide you with the care you need, when you need it.  We recommend signing up for the patient portal called "MyChart".  Sign up information is provided on this After Visit Summary.  MyChart is used to connect with patients for Virtual Visits (Telemedicine).  Patients are able to view lab/test results, encounter notes, upcoming appointments, etc.  Non-urgent messages can be sent to your provider as well.   To learn more about what you can do with MyChart, go to [email or web address].    Your next appointment:   1 month(s)  The format for your next appointment:   In Person  Provider:   Rozann Lesches, MD    Other Instructions  Keep your Dr. Myles Gip appointment.   Important Information About Sugar

## 2022-01-02 ENCOUNTER — Ambulatory Visit (INDEPENDENT_AMBULATORY_CARE_PROVIDER_SITE_OTHER): Payer: Medicare Other

## 2022-01-02 DIAGNOSIS — I4819 Other persistent atrial fibrillation: Secondary | ICD-10-CM | POA: Diagnosis not present

## 2022-01-03 ENCOUNTER — Encounter: Payer: Self-pay | Admitting: Gastroenterology

## 2022-01-03 ENCOUNTER — Ambulatory Visit (INDEPENDENT_AMBULATORY_CARE_PROVIDER_SITE_OTHER): Payer: Medicare Other | Admitting: Gastroenterology

## 2022-01-03 VITALS — BP 132/71 | HR 71 | Temp 97.3°F | Ht 64.0 in | Wt 167.0 lb

## 2022-01-03 DIAGNOSIS — Z8619 Personal history of other infectious and parasitic diseases: Secondary | ICD-10-CM

## 2022-01-03 LAB — CUP PACEART REMOTE DEVICE CHECK
Date Time Interrogation Session: 20231112231457
Implantable Pulse Generator Implant Date: 20220210

## 2022-01-03 NOTE — Patient Instructions (Addendum)
Please stop pantoprazole today.   Please complete the urea breath test in 14 days (no sooner than 11/28).   [facility] is on the second floor of the medical building at [facility], [place], [place] [address].   We will see you back as needed!  I enjoyed seeing you again today! As you know, I value our relationship and want to provide genuine, compassionate, and quality care. I welcome your feedback. If you receive a survey regarding your visit,  I greatly appreciate you taking time to fill this out. See you next time!  Annitta Needs, PhD, ANP-BC [facility]

## 2022-01-03 NOTE — Progress Notes (Unsigned)
Cardiology Office Note  Date: 01/05/2022   ID: [name], DOB September 12, 1940, MRN [id number]  PCP:  Ivy Lynn, NP  Cardiologist:  Rozann Lesches, MD Electrophysiologist:  Thompson Grayer, MD   Chief Complaint  Patient presents with   Cardiac follow-up    History of Present Illness: [name] is an 81 y.o. female last seen in October by Ms. Swinyer NP, I reviewed the note.  She is here today with family member for a follow-up visit.  Continues to complain of intermittent episodes of fatigue and shortness of breath, has also had recurring atrial fibrillation based on ILR interrogation status post atrial fibrillation ablation in December 2022.  Most recent interrogation indicated  7.4 % AF burden.  She is not on antiarrhythmic therapy, previously taken off amiodarone due to hypothyroidism.  She does not describe any obvious angina at this time.  Her last echocardiogram was in November 2022 at which point LVEF was 55 to 60%, RV contraction normal with moderately elevated RVSP, sclerotic but nonstenotic aortic valve.  I did review her interval lab work as noted below.  ECG from late October showed sinus rhythm with frequent PACs.  Past Medical History:  Diagnosis Date   Anxiety    Arthritis    Bulging lumbar disc    Chronic lower back pain    Coronary atherosclerosis of native coronary artery    DES LAD 02/2008, DES LAD 02/2015, DES ramus and DES x2 mid RCA 02/2020   Depression    Dyslipidemia    Essential hypertension    Facial numbness    Frequent headaches    GERD (gastroesophageal reflux disease)    Glaucoma    Heart attack ([place]) 02/2020   Insomnia    Paroxysmal atrial fibrillation ([place])    Diagnosed October 2019   PSVT (paroxysmal supraventricular tachycardia)    Refusal of blood transfusions as patient is Jehovah's Witness    Thyroid disease    Type 2 diabetes mellitus ([place])    Borderline   Vitamin D deficiency     Past Surgical History:  Procedure  Laterality Date   ATRIAL FIBRILLATION ABLATION N/A 02/16/2021   Procedure: ATRIAL FIBRILLATION ABLATION;  Surgeon: Thompson Grayer, MD;  Location: [place] [facility];  Service: Cardiovascular;  Laterality: N/A;   BIOPSY  09/21/2021   Procedure: BIOPSY;  Surgeon: Daneil Dolin, MD;  [facility];  Service: Endoscopy;;  gastric   CARDIAC CATHETERIZATION N/A 02/23/2015   Procedure: Left Heart Cath and Coronary Angiography;  Surgeon: Sherren Mocha, MD;  Location: [place] [facility];  Service: Cardiovascular;  Laterality: N/A;   CARDIAC CATHETERIZATION N/A 02/23/2015   Procedure: Coronary Stent Intervention;  Surgeon: Sherren Mocha, MD;  Location: [place] [facility];  Service: Cardiovascular;  Laterality: N/A;   CARDIAC CATHETERIZATION N/A 01/04/2016   Procedure: Left Heart Cath and Coronary Angiography;  Surgeon: Peter M Martinique, MD;  Location: [facility];  Service: Cardiovascular;  Laterality: N/A;   CATARACT EXTRACTION     CORONARY ANGIOPLASTY  02/23/2015   CORONARY ANGIOPLASTY WITH STENT PLACEMENT  2009   CORONARY STENT INTERVENTION N/A 03/09/2020   Procedure: CORONARY STENT INTERVENTION;  Surgeon: Jettie Booze, MD;  Location: [facility];  Service: Cardiovascular;  Laterality: N/A;   DILATION AND CURETTAGE OF UTERUS     ESOPHAGOGASTRODUODENOSCOPY (EGD) WITH PROPOFOL N/A 09/21/2021   normal esophagus, abnormal gastric mucosa s/p biopsy, normal duodenum. Pathology with H.pylori. Prescribed Prevpac.  implantable loop recorder placement  04/01/2020   Medtronic Reveal [place] model LNQ 22 [id number]) implantable loop recorder    LEFT HEART CATH AND CORONARY ANGIOGRAPHY N/A 03/09/2020   Procedure: LEFT HEART CATH AND CORONARY ANGIOGRAPHY;  Surgeon: Jettie Booze, MD;  Location: [facility];  Service: Cardiovascular;  Laterality: N/A;   TUBAL LIGATION      Current Outpatient Medications  Medication Sig Dispense Refill   acetaminophen (TYLENOL) 500  MG tablet Take 1,000 mg by mouth every 6 (six) hours as needed for moderate pain.     Ascorbic Acid (VITAMIN C) 500 MG tablet Take 500 mg by mouth daily.       bimatoprost (LUMIGAN) 0.01 % SOLN Place 1 drop into both eyes at bedtime.     Calcium Carbonate Antacid (TUMS PO) Take 2 tablets by mouth 4 (four) times daily as needed (for acid reflux/indigestion).      Camphor-Menthol-Methyl Sal (SALONPAS) 3.02-26-08 % PTCH Apply 1 patch topically daily as needed (pain).     cetirizine (ZYRTEC) 10 MG tablet Take 10 mg by mouth daily as needed for allergies.     Cyanocobalamin (VITAMIN B 12 PO) Take 1 tablet by mouth daily.     diclofenac Sodium (VOLTAREN) 1 % GEL Apply 2 g topically 4 (four) times daily. (Patient taking differently: Apply 2 g topically 4 (four) times daily as needed (pain).) 50 g 2   diltiazem (CARDIZEM CD) 240 MG 24 hr capsule TAKE ONE CAPSULE BY MOUTH DAILY 90 capsule 1   ELIQUIS 5 MG TABS tablet TAKE ONE TABLET BY MOUTH TWICE DAILY 60 tablet 5   GARLIC PO Take 1 tablet by mouth daily.     HYDROcodone-acetaminophen (NORCO) 5-325 MG tablet Take 1 tablet by mouth every 6 (six) hours as needed for moderate pain. 30 tablet 0   levothyroxine (SYNTHROID) 50 MCG tablet Take 1 tablet (50 mcg total) by mouth daily before breakfast. 90 tablet 3   losartan (COZAAR) 50 MG tablet Take 1 tablet (50 mg total) by mouth daily. 90 tablet 3   MAGNESIUM CARBONATE PO Take 1 tablet by mouth at bedtime.     metoprolol succinate (TOPROL-XL) 100 MG 24 hr tablet TAKE 1 TABLET BY MOUTH DAILY WITH FOOD 30 tablet 11   nitroGLYCERIN (NITROSTAT) 0.4 MG SL tablet Place 0.4 mg under the tongue every 5 (five) minutes as needed for chest pain.     pantoprazole (PROTONIX) 40 MG tablet Take 40 mg by mouth daily.     rosuvastatin (CRESTOR) 20 MG tablet TAKE ONE (1) TABLET BY MOUTH EVERY DAY 90 tablet 1   timolol (TIMOPTIC) 0.5 % ophthalmic solution Place 1 drop into both eyes daily.     traZODone (DESYREL) 150 MG tablet  Take 1 tablet (150 mg total) by mouth at bedtime. 90 tablet 1   TURMERIC PO Take 1 tablet by mouth daily.     VENTOLIN HFA 108 (90 Base) MCG/ACT inhaler Inhale 1-2 puffs into the lungs every 4 (four) hours as needed for shortness of breath. 18 g 1   Vitamin D, Cholecalciferol, 10 MCG (400 UNIT) CAPS Take 400 Units by mouth daily.     No current facility-administered medications for this visit.   Allergies:  Patient has no known allergies.   ROS: No syncope.  Physical Exam: VS:  BP 132/78   Pulse 88   Ht '5\' 4"'$  (1.626 m)   Wt 167 lb (75.8 kg)   LMP  (LMP Unknown)   SpO2  94%   BMI 28.67 kg/m , BMI Body mass index is 28.67 kg/m.  Wt Readings from Last 3 Encounters:  01/05/22 167 lb (75.8 kg)  01/03/22 167 lb (75.8 kg)  12/19/21 165 lb (74.8 kg)    General: Patient appears comfortable at rest. HEENT: Conjunctiva and lids normal.. Neck: Supple, no elevated JVP or carotid bruits. Lungs: Clear to auscultation, nonlabored breathing at rest. Cardiac: Irregularly irregular, no S3, 1/6 systolic murmur. Extremities: No pitting edema.  ECG:  An ECG dated 12/14/2021 was personally reviewed today and demonstrated:  Sinus rhythm with PACs.  Recent Labwork: 01/09/2021: B Natriuretic Peptide 1,296.0 04/15/2021: ALT 10; AST 16 07/26/2021: TSH 0.529 12/14/2021: BUN 11; Creatinine, Ser 0.68; Hemoglobin 12.2; Platelets 207; Potassium 4.0; Sodium 137     Component Value Date/Time   CHOL 109 04/15/2021 0935   TRIG 103 04/15/2021 0935   HDL 46 04/15/2021 0935   CHOLHDL 2.4 04/15/2021 0935   CHOLHDL 2.4 03/09/2020 0209   VLDL 12 03/09/2020 0209   LDLCALC 44 04/15/2021 0935    Other Studies Reviewed Today:  Echocardiogram 01/10/2021:  1. Left ventricular ejection fraction, by estimation, is 55 to 60%. The  left ventricle has normal function. The left ventricle has no regional  wall motion abnormalities. There is mild left ventricular hypertrophy.  Left ventricular diastolic parameters   are indeterminate.   2. Right ventricular systolic function is normal. The right ventricular  size is normal. There is moderately elevated pulmonary artery systolic  pressure.   3. Left atrial size was mildly dilated.   4. The mitral valve is normal in structure. No evidence of mitral valve  regurgitation. No evidence of mitral stenosis. Moderate mitral annular  calcification.   5. The tricuspid valve is abnormal. Tricuspid valve regurgitation is  moderate.   6. The aortic valve is tricuspid. There is moderate calcification of the  aortic valve. There is moderate thickening of the aortic valve. Aortic  valve regurgitation is mild. No aortic stenosis is present.   7. The inferior vena cava is dilated in size with >50% respiratory  variability, suggesting right atrial pressure of 8 mmHg.   Assessment and Plan:  1.  Persistent atrial fibrillation with CHA2DS2-VASc score of 6 status post atrial fibrillation ablation with Dr. Rayann Heman back in December 2022.  ILR in place with interrogation documenting recurrent atrial fibrillation that is symptomatic as discussed above.  She is not on antiarrhythmic therapy, previously taken off amiodarone due to hypothyroidism.  Suspect she may still need antiarrhythmic therapy and she has follow-up pending with Dr. Myles Gip in early December for further discussion.  For now continue Eliquis, Cardizem CD, and Toprol-XL.  2. CAD status post DES to the LAD in 2010, DES to the LAD in 2017, and DES to the ramus intermedius as well as DES x2 to the RCA and 2022.  LVEF 55 to 60% with moderately elevated RVSP as of November 2022.  We will obtain a follow-up echocardiogram to ensure no substantial changes as this may also impact choice of antiarrhythmic therapy.  Medication Adjustments/Labs and Tests Ordered: Current medicines are reviewed at length with the patient today.  Concerns regarding medicines are outlined above.   Tests Ordered: Orders Placed This Encounter   Procedures   ECHOCARDIOGRAM COMPLETE    Medication Changes: No orders of the defined types were placed in this encounter.   Disposition:  Follow up  6 months.  Signed, Satira Sark, MD, [facility] 01/05/2022 12:21 PM    [facility] [address], [place], [place] [address] Phone: [phone]; Fax: [phone]

## 2022-01-03 NOTE — Progress Notes (Signed)
Gastroenterology Office Note     Primary Care Physician:  Ivy Lynn, NP  Primary Gastroenterologist: Dr. Gala Romney    Chief Complaint   Chief Complaint  Patient presents with   Follow-up    3 month follow up on GERD, pt states she is better     History of Present Illness   [name] is an 81 y.o. female presenting today in follow-up with a history of chronic GERD, lase seen in June 2023.   At last visit had noted intermittent nausea and vomiting that was sporadic, associated weight loss over past 1-2 years without abdominal pain. Weight stable for at least the past year now. EGD completed with normal esophagus, abnormal gastric mucosa s/p biopsy, normal duodenum. Pathology with H.pylori. Prescribed Prevpac.   Completed Prevpac. No abdominal pain, N/V, changes in bowel habits, constipation, diarrhea, overt GI bleeding, GERD, dysphagia. Appetite waxes and wanes but weight is stable.    Past Medical History:  Diagnosis Date   Anxiety    Arthritis    Bulging lumbar disc    Chronic lower back pain    Coronary atherosclerosis of native coronary artery    DES LAD 02/2008, DES LAD 02/2015, DES ramus and DES x2 mid RCA 02/2020   Depression    Dyslipidemia    Essential hypertension    Facial numbness    Frequent headaches    GERD (gastroesophageal reflux disease)    Glaucoma    Heart attack ([place]) 02/2020   Insomnia    Paroxysmal atrial fibrillation ([place])    Diagnosed October 2019   PSVT (paroxysmal supraventricular tachycardia)    Refusal of blood transfusions as patient is Jehovah's Witness    Thyroid disease    Type 2 diabetes mellitus ([place])    Borderline   Vitamin D deficiency     Past Surgical History:  Procedure Laterality Date   ATRIAL FIBRILLATION ABLATION N/A 02/16/2021   Procedure: ATRIAL FIBRILLATION ABLATION;  Surgeon: Thompson Grayer, MD;  Location: [place] [facility];  Service: Cardiovascular;  Laterality: N/A;   BIOPSY  09/21/2021   Procedure:  BIOPSY;  Surgeon: Daneil Dolin, MD;  [facility];  Service: Endoscopy;;  gastric   CARDIAC CATHETERIZATION N/A 02/23/2015   Procedure: Left Heart Cath and Coronary Angiography;  Surgeon: Sherren Mocha, MD;  Location: [facility];  Service: Cardiovascular;  Laterality: N/A;   CARDIAC CATHETERIZATION N/A 02/23/2015   Procedure: Coronary Stent Intervention;  Surgeon: Sherren Mocha, MD;  Location: [place] [facility];  Service: Cardiovascular;  Laterality: N/A;   CARDIAC CATHETERIZATION N/A 01/04/2016   Procedure: Left Heart Cath and Coronary Angiography;  Surgeon: Peter M Martinique, MD;  Location: [place] [facility];  Service: Cardiovascular;  Laterality: N/A;   CATARACT EXTRACTION     CORONARY ANGIOPLASTY  02/23/2015   CORONARY ANGIOPLASTY WITH STENT PLACEMENT  2009   CORONARY STENT INTERVENTION N/A 03/09/2020   Procedure: CORONARY STENT INTERVENTION;  Surgeon: Jettie Booze, MD;  Location: [place] [facility];  Service: Cardiovascular;  Laterality: N/A;   DILATION AND CURETTAGE OF UTERUS     ESOPHAGOGASTRODUODENOSCOPY (EGD) WITH PROPOFOL N/A 09/21/2021   normal esophagus, abnormal gastric mucosa s/p biopsy, normal duodenum. Pathology with H.pylori. Prescribed Prevpac.   implantable loop recorder placement  04/01/2020   Medtronic Reveal [place] model LNQ 22 [id number]) implantable loop recorder    LEFT HEART CATH AND CORONARY ANGIOGRAPHY N/A 03/09/2020   Procedure: LEFT HEART CATH AND CORONARY ANGIOGRAPHY;  Surgeon: Jettie Booze, MD;  Location: [place] [facility];  Service: Cardiovascular;  Laterality: N/A;   TUBAL LIGATION      Current Outpatient Medications  Medication Sig Dispense Refill   acetaminophen (TYLENOL) 500 MG tablet Take 1,000 mg by mouth every 6 (six) hours as needed for moderate pain.     Ascorbic Acid (VITAMIN C) 500 MG tablet Take 500 mg by mouth daily.       bimatoprost (LUMIGAN) 0.01 % SOLN Place 1 drop into both eyes at bedtime.      Calcium Carbonate Antacid (TUMS PO) Take 2 tablets by mouth 4 (four) times daily as needed (for acid reflux/indigestion).      Camphor-Menthol-Methyl Sal (SALONPAS) 3.02-26-08 % PTCH Apply 1 patch topically daily as needed (pain).     cetirizine (ZYRTEC) 10 MG tablet Take 10 mg by mouth daily as needed for allergies.     Cyanocobalamin (VITAMIN B 12 PO) Take 1 tablet by mouth daily.     diclofenac Sodium (VOLTAREN) 1 % GEL Apply 2 g topically 4 (four) times daily. (Patient taking differently: Apply 2 g topically 4 (four) times daily as needed (pain).) 50 g 2   diltiazem (CARDIZEM CD) 240 MG 24 hr capsule TAKE ONE CAPSULE BY MOUTH DAILY 90 capsule 1   ELIQUIS 5 MG TABS tablet TAKE ONE TABLET BY MOUTH TWICE DAILY 60 tablet 5   GARLIC PO Take 1 tablet by mouth daily.     HYDROcodone-acetaminophen (NORCO) 5-325 MG tablet Take 1 tablet by mouth every 6 (six) hours as needed for moderate pain. 30 tablet 0   levothyroxine (SYNTHROID) 50 MCG tablet Take 1 tablet (50 mcg total) by mouth daily before breakfast. 90 tablet 3   losartan (COZAAR) 50 MG tablet Take 1 tablet (50 mg total) by mouth daily. 90 tablet 3   MAGNESIUM CARBONATE PO Take 1 tablet by mouth at bedtime.     metoprolol succinate (TOPROL XL) 100 MG 24 hr tablet Take 1 tablet (100 mg total) by mouth daily. Take with or immediately following a meal. 30 tablet 11   nitroGLYCERIN (NITROSTAT) 0.4 MG SL tablet Place 0.4 mg under the tongue every 5 (five) minutes as needed for chest pain.     pantoprazole (PROTONIX) 40 MG tablet Take 40 mg by mouth daily.     rosuvastatin (CRESTOR) 20 MG tablet TAKE ONE (1) TABLET BY MOUTH EVERY DAY 90 tablet 1   timolol (TIMOPTIC) 0.5 % ophthalmic solution Place 1 drop into both eyes daily.     traZODone (DESYREL) 150 MG tablet Take 1 tablet (150 mg total) by mouth at bedtime. 90 tablet 1   TURMERIC PO Take 1 tablet by mouth daily.     VENTOLIN HFA 108 (90 Base) MCG/ACT inhaler Inhale 1-2 puffs into the lungs every 4  (four) hours as needed for shortness of breath. 18 g 1   Vitamin D, Cholecalciferol, 10 MCG (400 UNIT) CAPS Take 400 Units by mouth daily.     No current facility-administered medications for this visit.    Allergies as of 01/03/2022   (No Known Allergies)    Family History  Problem Relation Age of Onset   Other Mother        "natural causes"   Heart disease Mother    Cancer Father        unsure of type   Colon cancer Daughter        2011    Social History   Socioeconomic History  Marital status: Widowed    Spouse name: Not on file   Number of children: 8   Years of education: 10th grade   Highest education level: 10th grade  Occupational History   Occupation: RETIRED  Tobacco Use   Smoking status: Never   Smokeless tobacco: Never   Tobacco comments:    Never smoke 11/15/21  Vaping Use   Vaping Use: Never used  Substance and Sexual Activity   Alcohol use: Not Currently    Alcohol/week: 1.0 standard drink of alcohol    Types: 1 Glasses of wine per week    Comment: RARE OCCASION   Drug use: No   Sexual activity: Not Currently  Other Topics Concern   Not on file  Social History Narrative   Lives at home alone.   Right-handed.   No caffeine use.   Daughter lives next door and helps her out   Social Determinants of Health   Financial Resource Strain: Low Risk  (11/08/2021)   Overall Financial Resource Strain (CARDIA)    Difficulty of Paying Living Expenses: Not hard at all  Food Insecurity: No Food Insecurity (11/08/2021)   Hunger Vital Sign    Worried About Running Out of Food in the Last Year: Never true    Ran Out of Food in the Last Year: Never true  Transportation Needs: No Transportation Needs (11/08/2021)   PRAPARE - Hydrologist (Medical): No    Lack of Transportation (Non-Medical): No  Physical Activity: Inactive (02/26/2020)   Exercise Vital Sign    Days of Exercise per Week: 0 days    Minutes of Exercise per Session: 0  min  Stress: No Stress Concern Present (11/08/2021)   [place]    Feeling of Stress : Only a little  Social Connections: Moderately Integrated (02/26/2020)   Social Connection and Isolation Panel [NHANES]    Frequency of Communication with Friends and Family: More than three times a week    Frequency of Social Gatherings with Friends and Family: More than three times a week    Attends Religious Services: More than 4 times per year    Active Member of [employer] or Organizations: Yes    Attends Archivist Meetings: More than 4 times per year    Marital Status: Widowed  Intimate Partner Violence: Not At Risk (11/08/2021)   Humiliation, Afraid, Rape, and Kick questionnaire    Fear of Current or Ex-Partner: No    Emotionally Abused: No    Physically Abused: No    Sexually Abused: No     Review of Systems   Gen: Denies any fever, chills, fatigue, weight loss, lack of appetite.  CV: Denies chest pain, heart palpitations, peripheral edema, syncope.  Resp: Denies shortness of breath at rest or with exertion. Denies wheezing or cough.  GI: Denies dysphagia or odynophagia. Denies jaundice, hematemesis, fecal incontinence. GU : Denies urinary burning, urinary frequency, urinary hesitancy MS: Denies joint pain, muscle weakness, cramps, or limitation of movement.  Derm: Denies rash, itching, dry skin Psych: Denies depression, anxiety, memory loss, and confusion Heme: Denies bruising, bleeding, and enlarged lymph nodes.   Physical Exam   BP 132/71   Pulse 71   Temp (!) 97.3 F (36.3 C)   Ht '5\' 4"'$  (1.626 m)   Wt 167 lb (75.8 kg)   LMP  (LMP Unknown)   BMI 28.67 kg/m  General:   Alert and oriented. Pleasant and  cooperative. Well-nourished and well-developed.  Head:  Normocephalic and atraumatic. Eyes:  Without icterus Abdomen:  +BS, soft, non-tender and non-distended. No HSM noted. No guarding or rebound. No masses  appreciated.  Rectal:  Deferred  Msk:  Symmetrical without gross deformities. Normal posture. Extremities:  Without edema. Neurologic:  Alert and  oriented x4;  grossly normal neurologically. Skin:  Intact without significant lesions or rashes. Psych:  Alert and cooperative. Normal mood and affect.   Assessment   [name] is a 80 y.o. female presenting today in follow-up with a history of chronic GERD, lase seen in June 2023.   In interim from last visit, she underwent EGD with gastric biopsies revealing H.pylori. She completed the Prevpac course without issues.  Doing well today. Continues on pantoprazole.  Need to check for H.pylori eradication. As she is doing well, we will see her back as needed. She may stay off of pantoprazole following urea breath test if no issues with GERD.     PLAN   Stop PPI X 14 days Urea breath test no sooner than 14 days Return prn   Annitta Needs, PhD, [facility] Gastroenter[facility]

## 2022-01-05 ENCOUNTER — Other Ambulatory Visit: Payer: Self-pay | Admitting: Cardiology

## 2022-01-05 ENCOUNTER — Ambulatory Visit: Payer: Medicare Other | Attending: Cardiology | Admitting: Cardiology

## 2022-01-05 ENCOUNTER — Encounter: Payer: Self-pay | Admitting: Cardiology

## 2022-01-05 ENCOUNTER — Other Ambulatory Visit: Payer: Self-pay | Admitting: Nurse Practitioner

## 2022-01-05 VITALS — BP 132/78 | HR 88 | Ht 64.0 in | Wt 167.0 lb

## 2022-01-05 DIAGNOSIS — I4819 Other persistent atrial fibrillation: Secondary | ICD-10-CM | POA: Diagnosis not present

## 2022-01-05 DIAGNOSIS — F5101 Primary insomnia: Secondary | ICD-10-CM

## 2022-01-05 DIAGNOSIS — I25119 Atherosclerotic heart disease of native coronary artery with unspecified angina pectoris: Secondary | ICD-10-CM

## 2022-01-05 NOTE — Patient Instructions (Signed)
Medication Instructions:  Your physician recommends that you continue on your current medications as directed. Please refer to the Current Medication list given to you today.  Labwork: none  Testing/Procedures: Your physician has requested that you have an echocardiogram. Echocardiography is a painless test that uses sound waves to create images of your heart. It provides your doctor with information about the size and shape of your heart and how well your heart's chambers and valves are working. This procedure takes approximately one hour. There are no restrictions for this procedure. Please do NOT wear cologne, perfume, aftershave, or lotions (deodorant is allowed). Please arrive 15 minutes prior to your appointment time.  Follow-Up: Your physician recommends that you schedule a follow-up appointment in: 6 months  Any Other Special Instructions Will Be Listed Below (If Applicable).  If you need a refill on your cardiac medications before your next appointment, please call your pharmacy. 

## 2022-01-11 ENCOUNTER — Ambulatory Visit: Payer: Medicare Other | Attending: Cardiology

## 2022-01-11 DIAGNOSIS — I4819 Other persistent atrial fibrillation: Secondary | ICD-10-CM | POA: Diagnosis not present

## 2022-01-11 LAB — ECHOCARDIOGRAM COMPLETE
AR max vel: 1.03 cm2
AV Area VTI: 1.1 cm2
AV Area mean vel: 1.22 cm2
AV Mean grad: 10.4 mmHg
AV Peak grad: 20.5 mmHg
AV Vena cont: 0.3 cm
Ao pk vel: 2.26 m/s
Area-P 1/2: 3.96 cm2
Calc EF: 61.5 %
MV M vel: 4.64 m/s
MV Peak grad: 86 mmHg
P 1/2 time: 656 msec
S' Lateral: 2.6 cm
Single Plane A2C EF: 64.3 %
Single Plane A4C EF: 57.2 %

## 2022-01-19 ENCOUNTER — Other Ambulatory Visit: Payer: Self-pay | Admitting: Nurse Practitioner

## 2022-01-19 ENCOUNTER — Telehealth: Payer: Self-pay | Admitting: Nurse Practitioner

## 2022-01-19 DIAGNOSIS — F5101 Primary insomnia: Secondary | ICD-10-CM

## 2022-01-20 NOTE — Telephone Encounter (Signed)
Called patient it went to voice message, asked patient to call back

## 2022-01-20 NOTE — Telephone Encounter (Signed)
Patient wants Je to call her and don't call daughter. I updated her # in her chart.

## 2022-01-24 ENCOUNTER — Ambulatory Visit (INDEPENDENT_AMBULATORY_CARE_PROVIDER_SITE_OTHER): Payer: Medicare Other | Admitting: Nurse Practitioner

## 2022-01-24 ENCOUNTER — Encounter: Payer: Self-pay | Admitting: Nurse Practitioner

## 2022-01-24 VITALS — BP 128/58 | HR 89 | Temp 98.7°F | Ht 64.0 in | Wt 164.0 lb

## 2022-01-24 DIAGNOSIS — M79605 Pain in left leg: Secondary | ICD-10-CM

## 2022-01-24 DIAGNOSIS — E119 Type 2 diabetes mellitus without complications: Secondary | ICD-10-CM

## 2022-01-24 DIAGNOSIS — M549 Dorsalgia, unspecified: Secondary | ICD-10-CM

## 2022-01-24 DIAGNOSIS — F5101 Primary insomnia: Secondary | ICD-10-CM | POA: Diagnosis not present

## 2022-01-24 LAB — BAYER DCA HB A1C WAIVED: HB A1C (BAYER DCA - WAIVED): 7.4 % — ABNORMAL HIGH (ref 4.8–5.6)

## 2022-01-24 MED ORDER — HYDROCODONE-ACETAMINOPHEN 5-325 MG PO TABS: 1.0000 | ORAL_TABLET | Freq: Four times a day (QID) | ORAL | 0 refills | Status: DC | PRN

## 2022-01-24 MED ORDER — TRAZODONE HCL 150 MG PO TABS: 150.0000 mg | ORAL_TABLET | Freq: Every day | ORAL | 1 refills | Status: DC

## 2022-01-24 NOTE — Assessment & Plan Note (Signed)
Well managed on current medication. Refill sent to pharmacy: hydrocodone 3-325 mg tablet by mouth as needed for pain

## 2022-01-24 NOTE — Progress Notes (Signed)
Established Patient Office Visit  Subjective   Patient ID: [name], female    DOB: Sep 10, 1940  Age: 81 y.o. MRN: [id number]  Chief Complaint  Patient presents with   Medication Refill   Medical Management of Chronic Issues    Diabetes She presents for her follow-up diabetic visit. She has type 2 diabetes mellitus. No MedicAlert identification noted. Her disease course has been fluctuating. There are no hypoglycemic associated symptoms. Pertinent negatives for diabetes include no blurred vision, no fatigue, no foot ulcerations, no polydipsia, no polyphagia, no polyuria and no weight loss. There are no hypoglycemic complications. Symptoms are improving. There are no diabetic complications. Risk factors for coronary artery disease include diabetes mellitus and post-menopausal. She is compliant with treatment all of the time. Her weight is stable. She is following a generally healthy diet.  Back Pain This is a chronic problem. The current episode started more than 1 year ago. The problem occurs intermittently. The problem is unchanged. The quality of the pain is described as aching. The pain is The same all the time. The symptoms are aggravated by bending. Stiffness is present All day. Pertinent negatives include no fever, paresthesias, pelvic pain or weight loss. Risk factors include sedentary lifestyle and lack of exercise. Treatments tried: prescription pain medication. The treatment provided moderate relief.  Insomnia Primary symptoms: fragmented sleep, difficulty falling asleep.   The current episode started more than one year. The onset quality is gradual. The problem occurs every several days. The problem has been gradually improving since onset. How many beverages per day that contain caffeine: 0 - 1.  Nothing relieves the symptoms. Past treatments include medication. The treatment provided moderate relief. How long after going to bed to you fall asleep: 30-60 minutes.      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   Persistent atrial fibrillation ([place]) 07/15/2020   Low back pain 03/17/2020   Non-ST elevation (NSTEMI) myocardial infarction [facility])    Unstable angina ([place]) 03/08/2020   Hypothyroidism 02/06/2020   Paroxysmal atrial fibrillation ([place]) 12/16/2019   Hypercoagulable state due to paroxysmal atrial fibrillation ([place]) 12/16/2019   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   Healthcare maintenance 10/02/2019   Anxiety with depression 10/02/2019   Numbness 04/02/2018   Numbness of tongue 04/02/2018   Neck pain on right side 04/02/2018   Diabetes mellitus without complication ([place]) 40/35/2481   Atrial fibrillation with rapid ventricular response (H[facility])    Chest pain in adult    Atrial fibrillation with RVR ([place]) 12/17/2017   Accelerating angina ([place])    Exertional angina 02/23/2015   Chest pain 11/01/2012   Mixed hyperlipidemia 11/12/2009   Essential hypertension 11/12/2009   Coronary atherosclerosis of native coronary artery 09/25/2008   Pain of back and left lower extremity 02/19/2008   Past Medical History:  Diagnosis Date   Anxiety    Arthritis    Bulging lumbar disc    Chronic lower back pain    Coronary atherosclerosis of native coronary artery    DES LAD 02/2008, DES LAD 02/2015, DES ramus and DES x2 mid RCA 02/2020   Depression    Dyslipidemia    Essential hypertension    Facial numbness    Frequent headaches    GERD (gastroesophageal reflux disease)    Glaucoma  Heart attack ([place]) 02/2020   Insomnia    Paroxysmal atrial fibrillation [facility])    Diagnosed October 2019   PSVT (paroxysmal supraventricular tachycardia)    Refusal of blood transfusions as patient is Jehovah's Witness    Thyroid disease    Type 2 diabetes mellitus ([place])     Borderline   Vitamin D deficiency    Past Surgical History:  Procedure Laterality Date   ATRIAL FIBRILLATION ABLATION N/A 02/16/2021   Procedure: ATRIAL FIBRILLATION ABLATION;  Surgeon: Thompson Grayer, MD;  Location: [place] [facility];  Service: Cardiovascular;  Laterality: N/A;   BIOPSY  09/21/2021   Procedure: BIOPSY;  Surgeon: Daneil Dolin, MD;  [facility];  Service: Endoscopy;;  gastric   CARDIAC CATHETERIZATION N/A 02/23/2015   Procedure: Left Heart Cath and Coronary Angiography;  Surgeon: Sherren Mocha, MD;  Location: [place] [facility];  Service: Cardiovascular;  Laterality: N/A;   CARDIAC CATHETERIZATION N/A 02/23/2015   Procedure: Coronary Stent Intervention;  Surgeon: Sherren Mocha, MD;  Location: [place] [facility];  Service: Cardiovascular;  Laterality: N/A;   CARDIAC CATHETERIZATION N/A 01/04/2016   Procedure: Left Heart Cath and Coronary Angiography;  Surgeon: Peter M Martinique, MD;  Location: [place] [facility];  Service: Cardiovascular;  Laterality: N/A;   CATARACT EXTRACTION     CORONARY ANGIOPLASTY  02/23/2015   CORONARY ANGIOPLASTY WITH STENT PLACEMENT  2009   CORONARY STENT INTERVENTION N/A 03/09/2020   Procedure: CORONARY STENT INTERVENTION;  Surgeon: Jettie Booze, MD;  Location: [place] [facility];  Service: Cardiovascular;  Laterality: N/A;   DILATION AND CURETTAGE OF UTERUS     ESOPHAGOGASTRODUODENOSCOPY (EGD) WITH PROPOFOL N/A 09/21/2021   normal esophagus, abnormal gastric mucosa s/p biopsy, normal duodenum. Pathology with H.pylori. Prescribed Prevpac.   implantable loop recorder placement  04/01/2020   [employer]tronic Reveal Perry model LNQ [id number]) implantable loop recorder    LEFT HEART CATH AND CORONARY ANGIOGRAPHY N/A 03/09/2020   Procedure: LEFT HEART CATH AND CORONARY ANGIOGRAPHY;  Surgeon: Jettie Booze, MD;  Location: [place] [facility];  Service: Cardiovascular;  Laterality: N/A;   TUBAL LIGATION     Social History    Tobacco Use   Smoking status: Never   Smokeless tobacco: Never   Tobacco comments:    Never smoke 11/15/21  Vaping Use   Vaping Use: Never used  Substance Use Topics   Alcohol use: Not Currently    Alcohol/week: 1.0 standard drink of alcohol    Types: 1 Glasses of wine per week    Comment: RARE OCCASION   Drug use: No   Social History   Socioeconomic History   Marital status: Widowed    Spouse name: Not on file   Number of children: 8   Years of education: 10th grade   Highest education level: 10th grade  Occupational History   Occupation: RETIRED  Tobacco Use   Smoking status: Never   Smokeless tobacco: Never   Tobacco comments:    Never smoke 11/15/21  Vaping Use   Vaping Use: Never used  Substance and Sexual Activity   Alcohol use: Not Currently    Alcohol/week: 1.0 standard drink of alcohol    Types: 1 Glasses of wine per week    Comment: RARE OCCASION   Drug use: No   Sexual activity: Not Currently  Other Topics Concern   Not on file  Social History Narrative   Lives at home alone.   Right-handed.   No caffeine  use.   Daughter lives next door and helps her out   Social Determinants of Health   Financial Resource Strain: Low Risk  (11/08/2021)   Overall Financial Resource Strain (CARDIA)    Difficulty of Paying Living Expenses: Not hard at all  Food Insecurity: No Food Insecurity (11/08/2021)   Hunger Vital Sign    Worried About Running Out of Food in the Last Year: Never true    Ran Out of Food in the Last Year: Never true  Transportation Needs: No Transportation Needs (11/08/2021)   PRAPARE - Hydrologist (Medical): No    Lack of Transportation (Non-Medical): No  Physical Activity: Inactive (02/26/2020)   Exercise Vital Sign    Days of Exercise per Week: 0 days    Minutes of Exercise per Session: 0 min  Stress: No Stress Concern Present (11/08/2021)   [place]    Feeling of Stress : Only a little  Social Connections: Moderately Integrated (02/26/2020)   Social Connection and Isolation Panel [NHANES]    Frequency of Communication with Friends and Family: More than three times a week    Frequency of Social Gatherings with Friends and Family: More than three times a week    Attends Religious Services: More than 4 times per year    Active Member of [employer] or Organizations: Yes    Attends Archivist Meetings: More than 4 times per year    Marital Status: Widowed  Intimate Partner Violence: Not At Risk (11/08/2021)   Humiliation, Afraid, Rape, and Kick questionnaire    Fear of Current or Ex-Partner: No    Emotionally Abused: No    Physically Abused: No    Sexually Abused: No   Family Status  Relation Name Status   Mother  Alive       1992   Father  Deceased   Sister  Deceased       coronary artery disease   Sister  Deceased       coronary artery disease   Sister  Deceased   Sister  Deceased   Sister  Alive   Sister  Alive   Brother  Deceased       from myocardial infarction   Brother  Deceased   Brother  Deceased   Brother  Alive   Brother  Alive   Daughter  Alive   Family History  Problem Relation Age of Onset   Other Mother        "natural causes"   Heart disease Mother    Cancer Father        unsure of type   Colon cancer Daughter        2011   No Known Allergies    Review of Systems  Constitutional: Negative.  Negative for fatigue, fever and weight loss.  HENT: Negative.    Eyes:  Negative for blurred vision.  Respiratory: Negative.    Cardiovascular: Negative.   Gastrointestinal: Negative.   Genitourinary: Negative.  Negative for pelvic pain.  Musculoskeletal:  Positive for back pain.  Skin: Negative.   Neurological:  Negative for paresthesias.  Endo/Heme/Allergies:  Negative for polydipsia and polyphagia.  Psychiatric/Behavioral:  The patient has insomnia.   All other systems reviewed and  are negative.     Objective:     BP (!) 128/58   Pulse 89   Temp 98.7 F (37.1 C)   Ht _0  (1.626 m)  Wt 164 lb (74.4 kg)   LMP  (LMP Unknown)   SpO2 95%   BMI 28.15 kg/m  BP Readings from Last 3 Encounters:  01/24/22 (!) 128/58  01/05/22 132/78  01/03/22 132/71   Wt Readings from Last 3 Encounters:  01/24/22 164 lb (74.4 kg)  01/05/22 167 lb (75.8 kg)  01/03/22 167 lb (75.8 kg)      Physical Exam Vitals and nursing note reviewed.  Constitutional:      Appearance: Normal appearance.  HENT:     Head: Normocephalic.     Right Ear: External ear normal.     Left Ear: External ear normal.     Nose: Nose normal.  Eyes:     Conjunctiva/sclera: Conjunctivae normal.     Pupils: Pupils are equal, round, and reactive to light.  Cardiovascular:     Rate and Rhythm: Normal rate and regular rhythm.     Pulses: Normal pulses.     Heart sounds: Normal heart sounds.  Pulmonary:     Effort: Pulmonary effort is normal.     Breath sounds: Normal breath sounds.  Abdominal:     General: Bowel sounds are normal.  Musculoskeletal:        General: Normal range of motion.  Skin:    General: Skin is warm.  Neurological:     General: No focal deficit present.     Mental Status: She is alert and oriented to person, place, and time.  Psychiatric:        Behavior: Behavior normal.      Results for orders placed or performed in visit on 01/24/22  Bayer DCA Hb A1c Waived  Result Value Ref Range   HB A1C (BAYER DCA - WAIVED) 7.4 (H) 4.8 - 5.6 %    Last CBC Lab Results  Component Value Date   WBC 5.7 12/14/2021   HGB 12.2 12/14/2021   HCT 36.7 12/14/2021   MCV 89.1 12/14/2021   MCH 29.6 12/14/2021   RDW 12.4 12/14/2021   PLT 207 00/17/4944   Last metabolic panel Lab Results  Component Value Date   GLUCOSE 162 (H) 12/14/2021   NA 137 12/14/2021   K 4.0 12/14/2021   CL 103 12/14/2021   CO2 28 12/14/2021   BUN 11 12/14/2021   CREATININE 0.68 12/14/2021    GFRNONAA >60 12/14/2021   CALCIUM 9.4 12/14/2021   PROT 6.5 04/15/2021   ALBUMIN 4.3 04/15/2021   LABGLOB 2.2 04/15/2021   AGRATIO 2.0 04/15/2021   BILITOT 0.3 04/15/2021   ALKPHOS 98 04/15/2021   AST 16 04/15/2021   ALT 10 04/15/2021   ANIONGAP 6 12/14/2021   Last lipids Lab Results  Component Value Date   CHOL 109 04/15/2021   HDL 46 04/15/2021   LDLCALC 44 04/15/2021   TRIG 103 04/15/2021   CHOLHDL 2.4 04/15/2021   Last hemoglobin A1c Lab Results  Component Value Date   HGBA1C 7.4 (H) 01/24/2022      The ASCVD Risk score (Arnett DK, et al., 2019) failed to calculate for the following reasons:   The 2019 ASCVD risk score is only valid for ages 59 to 80   The patient has a prior MI or stroke diagnosis    Assessment & Plan:   Problem List Items Addressed This Visit       Endocrine   Diabetes mellitus without complication ([place]) - Primary   Relevant Orders   Bayer DCA Hb A1c Waived (Completed)   CBC with Differential/Platelet  CMP14+EGFR   Vitamin B12   Lipid panel     Other   Pain of back and left lower extremity    Well managed on current medication. Refill sent to pharmacy: hydrocodone 3-325 mg tablet by mouth as needed for pain       Relevant Medications   traZODone (DESYREL) 150 MG tablet   HYDROcodone-acetaminophen (NORCO) 5-325 MG tablet   Primary insomnia    Continue trazodone at current dose.      Relevant Medications   traZODone (DESYREL) 150 MG tablet    Return in about 6 months (around 07/26/2022).    Ivy Lynn, NP

## 2022-01-24 NOTE — Assessment & Plan Note (Signed)
Continue trazodone at current dose.

## 2022-01-24 NOTE — Patient Instructions (Signed)
Diabetes Insipidus Diabetes insipidus (DI) is a rare condition that causes the body to produce more urine than normal. This leads to thirst and low fluid in the body (dehydration). In this condition, the urine is made mostly of water, or dilute urine. DI affects mostly adults, but it can happen at any age. There are four types of DI: Central DI. This is the most common type. Dipsogenic DI. Nephrogenic DI. Gestational DI. The most common forms of this condition are caused by a decrease in the production of the hormone that regulates urine output (antidiuretic hormone), or the body's resistance to this hormone. This condition is not related to type 1 or type 2 diabetes mellitus. What are the causes? Central DI is caused by damage to the pituitary gland or hypothalamus in the brain. Dipsogenic DI is caused by a defect in the thirst mechanism in the brain. This defect causes you to drink too much fluid. These may result from: Brain surgery. Infection. Inflammation. Brain tumor. Head injury. Nephrogenic DI is caused by the kidneys not responding to the antidiuretic hormone in the body. This may result from: Chronic kidney disease (CKD). Certain medicines, such as lithium. Low potassium levels. High calcium levels. Gestational DI is rare and is caused by the antidiuretic hormone that has stopped working properly. What are the signs or symptoms? Symptoms of this condition include: Excessive urination. This means urinating more than 10 cups (2.4 L) during a period of 24 hours. Excessive thirst. Too much nighttime urination (nocturia). Nausea. Diarrhea. How is this diagnosed? This condition may be diagnosed based on: Your medical history. A physical exam. Blood tests. Urine tests. A water deprivation test. During this test, you will stop drinking fluids for a period of time and your blood and urine will be checked regularly. An MRI. How is this treated? Once your specific type of  diabetes insipidus is diagnosed, treatment may include one or more of the following: Increasing or limiting your fluid intake. Taking medicines that contain artificial (synthetic) versions of the antidiuretic hormone. Stopping certain medicines that you take. Correcting the balance of minerals (electrolytes) in your body. Changing your diet. You may be put on a low-protein and low-sodium diet. You may need to visit your health care provider regularly to make sure your condition is being treated properly. You may also need to work with providers who specialize in: Kidney problems (nephrologist). Hormone disorders (endocrinologist). Follow these instructions at home:  Eating and drinking Follow instructions from your health care provider about how much fluid and water to drink. You may be directed to drink more fluids and water, or to limit how much fluid and water you drink. Follow instructions from your health care provider about eating or drinking restrictions. General instructions Take over-the-counter and prescription medicines only as told by your health care provider. If directed, monitor your risk of dehydration in extreme heat. Carry a medical alert card or wear medical alert jewelry. Keep all follow-up visits as told by your health care provider. This is important. You may need to visit your health care provider regularly to make sure your condition is being treated properly. Contact a health care provider if: You continue to have symptoms after treatment. Get help right away if: You have extreme thirst. You have symptoms of severe dehydration, such as rapid heart rate, muscle cramps, or confusion. Summary Diabetes insipidus (DI) is a rare condition that causes the body to produce more urine than normal, which leads to thirst and dehydration. Follow instructions   from your health care provider about eating or drinking restrictions. Treatment may include increasing or limiting your  fluid intake and correcting the balance of minerals (electrolytes) in your body. Get help right away if you have symptoms of severe dehydration, such as rapid heart rate, muscle cramps, or confusion. This information is not intended to replace advice given to you by your health care provider. Make sure you discuss any questions you have with your health care provider. Document Revised: 07/12/2021 Document Reviewed: 03/12/2019 Elsevier Patient Education  2023 Elsevier Inc.  

## 2022-01-25 LAB — CBC WITH DIFFERENTIAL/PLATELET
Basophils Absolute: 0 10*3/uL (ref 0.0–0.2)
Basos: 0 %
EOS (ABSOLUTE): 0.3 10*3/uL (ref 0.0–0.4)
Eos: 8 %
Hematocrit: 38.7 % (ref 34.0–46.6)
Hemoglobin: 13 g/dL (ref 11.1–15.9)
Immature Grans (Abs): 0 10*3/uL (ref 0.0–0.1)
Immature Granulocytes: 0 %
Lymphocytes Absolute: 1.7 10*3/uL (ref 0.7–3.1)
Lymphs: 37 %
MCH: 29.4 pg (ref 26.6–33.0)
MCHC: 33.6 g/dL (ref 31.5–35.7)
MCV: 88 fL (ref 79–97)
Monocytes Absolute: 0.4 10*3/uL (ref 0.1–0.9)
Monocytes: 8 %
Neutrophils Absolute: 2.2 10*3/uL (ref 1.4–7.0)
Neutrophils: 47 %
Platelets: 190 10*3/uL (ref 150–450)
RBC: 4.42 x10E6/uL (ref 3.77–5.28)
RDW: 12 % (ref 11.7–15.4)
WBC: 4.6 10*3/uL (ref 3.4–10.8)

## 2022-01-25 LAB — CMP14+EGFR
ALT: 16 IU/L (ref 0–32)
AST: 21 IU/L (ref 0–40)
Albumin/Globulin Ratio: 2.2 (ref 1.2–2.2)
Albumin: 4.4 g/dL (ref 3.7–4.7)
Alkaline Phosphatase: 92 IU/L (ref 44–121)
BUN/Creatinine Ratio: 12 (ref 12–28)
BUN: 10 mg/dL (ref 8–27)
Bilirubin Total: 0.3 mg/dL (ref 0.0–1.2)
CO2: 25 mmol/L (ref 20–29)
Calcium: 9.7 mg/dL (ref 8.7–10.3)
Chloride: 102 mmol/L (ref 96–106)
Creatinine, Ser: 0.82 mg/dL (ref 0.57–1.00)
Globulin, Total: 2 g/dL (ref 1.5–4.5)
Glucose: 182 mg/dL — ABNORMAL HIGH (ref 70–99)
Potassium: 4.3 mmol/L (ref 3.5–5.2)
Sodium: 141 mmol/L (ref 134–144)
Total Protein: 6.4 g/dL (ref 6.0–8.5)
eGFR: 72 mL/min/{1.73_m2} (ref >59)

## 2022-01-25 LAB — VITAMIN B12: Vitamin B-12: >2000 pg/mL — ABNORMAL HIGH (ref 232–1245)

## 2022-01-25 LAB — LIPID PANEL
Chol/HDL Ratio: 2.7 ratio (ref 0.0–4.4)
Cholesterol, Total: 109 mg/dL (ref 100–199)
HDL: 41 mg/dL (ref >39)
LDL Chol Calc (NIH): 51 mg/dL (ref 0–99)
Triglycerides: 87 mg/dL (ref 0–149)
VLDL Cholesterol Cal: 17 mg/dL (ref 5–40)

## 2022-01-27 ENCOUNTER — Ambulatory Visit: Payer: Medicare Other | Attending: Cardiovascular Disease | Admitting: Cardiovascular Disease

## 2022-01-27 ENCOUNTER — Encounter: Payer: Self-pay | Admitting: Cardiovascular Disease

## 2022-01-27 VITALS — BP 130/64 | HR 76 | Ht 64.0 in | Wt 164.6 lb

## 2022-01-27 DIAGNOSIS — I4891 Unspecified atrial fibrillation: Secondary | ICD-10-CM | POA: Diagnosis not present

## 2022-01-27 DIAGNOSIS — I480 Paroxysmal atrial fibrillation: Secondary | ICD-10-CM

## 2022-01-27 DIAGNOSIS — D6869 Other thrombophilia: Secondary | ICD-10-CM | POA: Diagnosis not present

## 2022-01-27 NOTE — Patient Instructions (Addendum)
Medication Instructions:  Continue all current medications.   Labwork: none  Testing/Procedures: none  Follow-Up: 6 months   Any Other Special Instructions Will Be Listed Below (If Applicable).   If you need a refill on your cardiac medications before your next appointment, please call your pharmacy.  

## 2022-01-27 NOTE — Progress Notes (Signed)
Primary Care Physician: Ivy Lynn, NP Primary Cardiologist: Dr Domenic Polite Primary Electrophysiologist: Dr Myles Gip ( Referring Physician: Dr Reinaldo Raddle [name] is a 81 y.o. female with a history of CAD, HTN, DM, HLD, and persistent atrial fibrillation who presents for follow    The patient was initially diagnosed with atrial fibrillation on 11/2017 after presenting with symptoms of palpitations and chest pain. She has had recurrences and underwent cardioversion. Monitor showed bursts of SVT. She failed amiodarone due to hypothyroidism.  Dr Rayann Heman performed AF ablation on 02/16/21. There were multiple atypical atrial flutter circuits noted. Post ablation, she had a R common femoral vein DVT.   she has no device related complaints -- no new tenderness, drainage, redness.  She does have occasional day with increased fatigue. She has rare palpitations.     she has a BMI of Body mass index is 28.25 kg/m.Marland Kitchen Filed Weights   01/27/22 1507  Weight: 164 lb 9.6 oz (74.7 kg)    Family History  Problem Relation Age of Onset   Other Mother        "natural causes"   Heart disease Mother    Cancer Father        unsure of type   Colon cancer Daughter        2011     Atrial Fibrillation Management history:  Previous antiarrhythmic drugs: amiodarone  Previous cardioversions: 11/11/18 Previous ablations: 02/16/21 CHADS2VASC score: 6 Anticoagulation history: Eliquis   Past Medical History:  Diagnosis Date   Anxiety    Arthritis    Bulging lumbar disc    Chronic lower back pain    Coronary atherosclerosis of native coronary artery    DES LAD 02/2008, DES LAD 02/2015, DES ramus and DES x2 mid RCA 02/2020   Depression    Dyslipidemia    Essential hypertension    Facial numbness    Frequent headaches    GERD (gastroesophageal reflux disease)    Glaucoma    Heart attack ([place]) 02/2020   Insomnia    Paroxysmal atrial fibrillation ([place])    Diagnosed October 2019   PSVT  (paroxysmal supraventricular tachycardia)    Refusal of blood transfusions as patient is Jehovah's Witness    Thyroid disease    Type 2 diabetes mellitus ([place])    Borderline   Vitamin D deficiency    Past Surgical History:  Procedure Laterality Date   ATRIAL FIBRILLATION ABLATION N/A 02/16/2021   Procedure: ATRIAL FIBRILLATION ABLATION;  Surgeon: Thompson Grayer, MD;  Location: [facility];  Service: Cardiovascular;  Laterality: N/A;   BIOPSY  09/21/2021   Procedure: BIOPSY;  Surgeon: Daneil Dolin, MD;  [facility];  Service: Endoscopy;;  gastric   CARDIAC CATHETERIZATION N/A 02/23/2015   Procedure: Left Heart Cath and Coronary Angiography;  Surgeon: Sherren Mocha, MD;  Location: [facility];  Service: Cardiovascular;  Laterality: N/A;   CARDIAC CATHETERIZATION N/A 02/23/2015   Procedure: Coronary Stent Intervention;  Surgeon: Sherren Mocha, MD;  Location: [place] [place] [facility];  Service: Cardiovascular;  Laterality: N/A;   CARDIAC CATHETERIZATION N/A 01/04/2016   Procedure: Left Heart Cath and Coronary Angiography;  Surgeon: Peter M Martinique, MD;  Location: [facility];  Service: Cardiovascular;  Laterality: N/A;   CATARACT EXTRACTION     CORONARY ANGIOPLASTY  02/23/2015   CORONARY ANGIOPLASTY WITH STENT PLACEMENT  2009   CORONARY STENT INTERVENTION N/A 03/09/2020   Procedure: CORONARY STENT INTERVENTION;  Surgeon: Irish Lack,  Charlann Lange, MD;  Location: [facility];  Service: Cardiovascular;  Laterality: N/A;   DILATION AND CURETTAGE OF UTERUS     ESOPHAGOGASTRODUODENOSCOPY (EGD) WITH PROPOFOL N/A 09/21/2021   normal esophagus, abnormal gastric mucosa s/p biopsy, normal duodenum. Pathology with H.pylori. Prescribed Prevpac.   implantable loop recorder placement  04/01/2020   [employer] Reveal Edison model [id number]) implantable loop recorder    LEFT HEART CATH AND CORONARY ANGIOGRAPHY N/A 03/09/2020   Procedure: LEFT HEART CATH AND CORONARY  ANGIOGRAPHY;  Surgeon: Jettie Booze, MD;  Location: [facility];  Service: Cardiovascular;  Laterality: N/A;   TUBAL LIGATION      Current Outpatient Medications  Medication Sig Dispense Refill   acetaminophen (TYLENOL) 500 MG tablet Take 1,000 mg by mouth every 6 (six) hours as needed for moderate pain.     Ascorbic Acid (VITAMIN C) 500 MG tablet Take 500 mg by mouth daily.       bimatoprost (LUMIGAN) 0.01 % SOLN Place 1 drop into both eyes at bedtime.     Calcium Carbonate Antacid (TUMS PO) Take 2 tablets by mouth 4 (four) times daily as needed (for acid reflux/indigestion).      Camphor-Menthol-Methyl Sal (SALONPAS) 3.02-26-08 % PTCH Apply 1 patch topically daily as needed (pain).     cetirizine (ZYRTEC) 10 MG tablet Take 10 mg by mouth daily as needed for allergies.     Cyanocobalamin (VITAMIN B 12 PO) Take 1 tablet by mouth daily.     diclofenac Sodium (VOLTAREN) 1 % GEL Apply 2 g topically 4 (four) times daily. (Patient taking differently: Apply 2 g topically 4 (four) times daily as needed (pain).) 50 g 2   diltiazem (CARDIZEM CD) 240 MG 24 hr capsule TAKE ONE CAPSULE BY MOUTH DAILY 90 capsule 1   ELIQUIS 5 MG TABS tablet TAKE ONE TABLET BY MOUTH TWICE DAILY 60 tablet 5   GARLIC PO Take 1 tablet by mouth daily.     HYDROcodone-acetaminophen (NORCO) 5-325 MG tablet Take 1 tablet by mouth every 6 (six) hours as needed for moderate pain. 30 tablet 0   levothyroxine (SYNTHROID) 50 MCG tablet Take 1 tablet (50 mcg total) by mouth daily before breakfast. 90 tablet 3   losartan (COZAAR) 50 MG tablet Take 1 tablet (50 mg total) by mouth daily. 90 tablet 3   MAGNESIUM CARBONATE PO Take 1 tablet by mouth at bedtime.     metoprolol succinate (TOPROL-XL) 100 MG 24 hr tablet TAKE 1 TABLET BY MOUTH DAILY WITH FOOD 30 tablet 11   nitroGLYCERIN (NITROSTAT) 0.4 MG SL tablet Place 0.4 mg under the tongue every 5 (five) minutes as needed for chest pain.     rosuvastatin (CRESTOR) 20 MG tablet  TAKE ONE (1) TABLET BY MOUTH EVERY DAY 90 tablet 1   timolol (TIMOPTIC) 0.5 % ophthalmic solution Place 1 drop into both eyes daily.     traZODone (DESYREL) 150 MG tablet Take 1 tablet (150 mg total) by mouth at bedtime. 90 tablet 1   TURMERIC PO Take 1 tablet by mouth daily.     VENTOLIN HFA 108 (90 Base) MCG/ACT inhaler Inhale 1-2 puffs into the lungs every 4 (four) hours as needed for shortness of breath. 18 g 1   Vitamin D, Cholecalciferol, 10 MCG (400 UNIT) CAPS Take 400 Units by mouth daily.     No current facility-administered medications for this visit.    No Known Allergies  Social History   Socioeconomic History  Marital status: Widowed    Spouse name: Not on file   Number of children: 8   Years of education: 10th grade   Highest education level: 10th grade  Occupational History   Occupation: RETIRED  Tobacco Use   Smoking status: Never   Smokeless tobacco: Never   Tobacco comments:    Never smoke 11/15/21  Vaping Use   Vaping Use: Never used  Substance and Sexual Activity   Alcohol use: Not Currently    Alcohol/week: 1.0 standard drink of alcohol    Types: 1 Glasses of wine per week    Comment: RARE OCCASION   Drug use: No   Sexual activity: Not Currently  Other Topics Concern   Not on file  Social History Narrative   Lives at home alone.   Right-handed.   No caffeine use.   Daughter lives next door and helps her out   Social Determinants of Health   Financial Resource Strain: Low Risk  (11/08/2021)   Overall Financial Resource Strain (CARDIA)    Difficulty of Paying Living Expenses: Not hard at all  Food Insecurity: No Food Insecurity (11/08/2021)   Hunger Vital Sign    Worried About Running Out of Food in the Last Year: Never true    Ran Out of Food in the Last Year: Never true  Transportation Needs: No Transportation Needs (11/08/2021)   PRAPARE - Hydrologist (Medical): No    Lack of Transportation (Non-Medical): No   Physical Activity: Inactive (02/26/2020)   Exercise Vital Sign    Days of Exercise per Week: 0 days    Minutes of Exercise per Session: 0 min  Stress: No Stress Concern Present (11/08/2021)   [place]    Feeling of Stress : Only a little  Social Connections: Moderately Integrated (02/26/2020)   Social Connection and Isolation Panel [NHANES]    Frequency of Communication with Friends and Family: More than three times a week    Frequency of Social Gatherings with Friends and Family: More than three times a week    Attends Religious Services: More than 4 times per year    Active Member of [employer] or Organizations: Yes    Attends Archivist Meetings: More than 4 times per year    Marital Status: Widowed  Intimate Partner Violence: Not At Risk (11/08/2021)   Humiliation, Afraid, Rape, and Kick questionnaire    Fear of Current or Ex-Partner: No    Emotionally Abused: No    Physically Abused: No    Sexually Abused: No     ROS- All systems are reviewed and negative except as per the HPI above.  Physical Exam: Vitals:   01/27/22 1507  BP: 130/64  Pulse: 76  SpO2: 98%  Weight: 164 lb 9.6 oz (74.7 kg)  Height: '5\' 4"'$  (1.626 m)     GEN- The patient is a well appearing elderly female, alert and oriented x 3 today.   Lungs- normal work of breathing Heart- Regular rate and rhythm, no murmurs, rubs or gallops    Wt Readings from Last 3 Encounters:  01/27/22 164 lb 9.6 oz (74.7 kg)  01/24/22 164 lb (74.4 kg)  01/05/22 167 lb (75.8 kg)      Echo 12/11/19 demonstrated   1. Left ventricular ejection fraction, by estimation, is 65 to 70%. The  left ventricle has normal function. The left ventricle has no regional  wall motion abnormalities. There is  mild left ventricular hypertrophy.  Left ventricular diastolic parameters are consistent with Grade II diastolic dysfunction (pseudonormalization). Elevated left  atrial pressure.   2. Right ventricular systolic function is normal. The right ventricular  size is normal. There is mildly elevated pulmonary artery systolic  pressure.   3. The mitral valve is normal in structure. Mild mitral valve  regurgitation. No evidence of mitral stenosis. Moderate mitral annular calcification.   4. The aortic valve has an indeterminant number of cusps. There is mild calcification of the aortic valve. There is mild thickening of the aortic valve. Aortic valve regurgitation is mild. No aortic stenosis is present.   5. Mild pulmonary HTN, PASP is 36 mmHg.   6. The inferior vena cava is normal in size with greater than 50%  respiratory variability, suggesting right atrial pressure of 3 mmHg.    Epic records are reviewed at length today  CHA2DS2-VASc Score = 6  The patient's score is based upon: CHF History: 1 HTN History: 1 Diabetes History: 0 Stroke History: 0 Vascular Disease History: 1 Age Score: 2 Gender Score: 1       ASSESSMENT AND PLAN: 1. Persistent Atrial Fibrillation (ICD10:  I48.19) The patient's CHA2DS2-VASc score is 6, indicating a 9.7% annual risk of stroke.   Recall amiodarone d/c 2/2 elevated TSH (stopped 11/11/19) Would avoid class IC with h/o CAD. S/p afib ablation with Dr Rayann Heman on 02/16/21 IL[facility] report today with ~3% AF, episodes are clustered, prdominantly over 2 days Continue Eliquis 5 mg BID  Continue diltiazem 240 mg daily Continue Toprol 100 mg daily I discussed dofetilide with her. At present, she does not want to be admitted for a drug load.  2. Secondary Hypercoagulable State (ICD10:  D68.69) The patient is at significant risk for stroke/thromboembolism based upon her CHA2DS2-VASc Score of 6.  Continue Apixaban (Eliquis).   3. CAD No anginal symptoms.  4. HTN Stable, no changes today.  5. Chronic diastolic CHF Fluid status appears stable.    Melida Quitter, MD  01/27/2022 3:41 PM

## 2022-02-02 ENCOUNTER — Ambulatory Visit (INDEPENDENT_AMBULATORY_CARE_PROVIDER_SITE_OTHER): Payer: Medicare Other

## 2022-02-02 DIAGNOSIS — I4891 Unspecified atrial fibrillation: Secondary | ICD-10-CM

## 2022-02-02 LAB — CUP PACEART REMOTE DEVICE CHECK
Date Time Interrogation Session: 20231213230239
Implantable Pulse Generator Implant Date: 20220210

## 2022-02-16 NOTE — Progress Notes (Signed)
Carelink Summary Report / Loop Recorder 

## 2022-02-17 ENCOUNTER — Telehealth: Payer: Self-pay | Admitting: Nurse Practitioner

## 2022-02-17 NOTE — Telephone Encounter (Signed)
Daughter states patient has had cough for two weeks with chest congestion. Reports no other symptoms. Wants to know what you recommend for cough and congestion.   Daughter [name] [phone]

## 2022-02-17 NOTE — Telephone Encounter (Signed)
R/c

## 2022-02-17 NOTE — Telephone Encounter (Signed)
Mucinex

## 2022-02-17 NOTE — Telephone Encounter (Signed)
Pts daughter wants to know what's the best thing to give her mom for sinus/cold symptoms?

## 2022-02-21 ENCOUNTER — Encounter: Payer: Self-pay | Admitting: Family Medicine

## 2022-02-21 ENCOUNTER — Telehealth (INDEPENDENT_AMBULATORY_CARE_PROVIDER_SITE_OTHER): Payer: Medicare Other | Admitting: Family Medicine

## 2022-02-21 DIAGNOSIS — J0100 Acute maxillary sinusitis, unspecified: Secondary | ICD-10-CM

## 2022-02-21 MED ORDER — AZITHROMYCIN 250 MG PO TABS: ORAL_TABLET | ORAL | 0 refills | Status: DC

## 2022-02-21 MED ORDER — BENZONATATE 200 MG PO CAPS: 200.0000 mg | ORAL_CAPSULE | Freq: Three times a day (TID) | ORAL | 0 refills | Status: DC | PRN

## 2022-02-21 NOTE — Progress Notes (Signed)
Subjective:    Patient ID: [name], female    DOB: 12-20-1940, 82 y.o.   MRN: [id number]   HPI: [name] is a 82 y.o. female presenting for cough, runny nose, wheezing. Coughing up sputum. Frequent on and on for two weeks. Feels dyspneic Inhaler helps. No fever.      01/24/2022    9:40 AM 11/08/2021    4:52 PM 03/04/2021    3:06 PM 01/18/2021   11:19 AM 12/03/2020    3:45 PM  Depression screen PHQ 2/9  Decreased Interest 2 0 2 0   Down, Depressed, Hopeless '2 1 2 '$ 0   PHQ - 2 Score '4 1 4 '$ 0   Altered sleeping 3  1 0   Tired, decreased energy '3  1 3   '$ Change in appetite '3  2 2   '$ Feeling bad or failure about yourself  1  0 0   Trouble concentrating '1  2 2   '$ Moving slowly or fidgety/restless 0  0 0   Suicidal thoughts 0  0 0   PHQ-9 Score '15  10 7   '$ Difficult doing work/chores Very difficult  Somewhat difficult Somewhat difficult Somewhat difficult     Relevant past medical, surgical, family and social history reviewed and updated as indicated.  Interim medical history since our last visit reviewed. Allergies and medications reviewed and updated.  ROS:  Review of Systems  Constitutional:  Negative for activity change, appetite change, chills and fever.  HENT:  Positive for congestion and rhinorrhea. Negative for ear discharge, ear pain, hearing loss, nosebleeds, postnasal drip, sinus pressure, sneezing and trouble swallowing.   Respiratory:  Positive for cough, chest tightness and shortness of breath.   Cardiovascular:  Negative for chest pain and palpitations.  Skin:  Negative for rash.     Social History   Tobacco Use  Smoking Status Never  Smokeless Tobacco Never  Tobacco Comments   Never smoke 11/15/21       Objective:     Wt Readings from Last 3 Encounters:  01/27/22 164 lb 9.6 oz (74.7 kg)  01/24/22 164 lb (74.4 kg)  01/05/22 167 lb (75.8 kg)     Exam deferred. Pt. Harboring due to COVID 19. Phone visit performed.   Assessment & Plan:    1. Acute maxillary sinusitis, recurrence not specified     Meds ordered this encounter  Medications   benzonatate (TESSALON) 200 MG capsule    Sig: Take 1 capsule (200 mg total) by mouth 3 (three) times daily as needed for cough.    Dispense:  20 capsule    Refill:  0   azithromycin (ZITHROMAX Z-PAK) 250 MG tablet    Sig: Take two right away Then one a day for the next 4 days.    Dispense:  6 each    Refill:  0    No orders of the defined types were placed in this encounter.     Diagnoses and all orders for this visit:  Acute maxillary sinusitis, recurrence not specified  Other orders -     benzonatate (TESSALON) 200 MG capsule; Take 1 capsule (200 mg total) by mouth 3 (three) times daily as needed for cough. -     azithromycin (ZITHROMAX Z-PAK) 250 MG tablet; Take two right away Then one a day for the next 4 days.    Virtual Visit via telephone Note  I discussed the limitations, risks, security and privacy concerns of performing an evaluation  and management service by telephone and the availability of in person appointments. The patient was identified with two identifiers. Pt.expressed understanding and agreed to proceed. Pt. Is at home. Dr. Livia Snellen is in his office.  Follow Up Instructions:   I discussed the assessment and treatment plan with the patient. The patient was provided an opportunity to ask questions and all were answered. The patient agreed with the plan and demonstrated an understanding of the instructions.   The patient was advised to call back or seek an in-person evaluation if the symptoms worsen or if the condition fails to improve as anticipated.   Total minutes including chart review and phone contact time: 7   Follow up plan: Return if symptoms worsen or fail to improve.  Julie Fraise, MD [place]

## 2022-02-24 NOTE — Progress Notes (Signed)
Carelink Summary Report / Loop Recorder 

## 2022-03-02 ENCOUNTER — Ambulatory Visit (INDEPENDENT_AMBULATORY_CARE_PROVIDER_SITE_OTHER): Payer: Medicare Other

## 2022-03-02 VITALS — Ht 64.0 in | Wt 164.0 lb

## 2022-03-02 DIAGNOSIS — Z0000 Encounter for general adult medical examination without abnormal findings: Secondary | ICD-10-CM

## 2022-03-02 NOTE — Patient Instructions (Signed)
[name] , Thank you for taking time to come for your Medicare Wellness Visit. I appreciate your ongoing commitment to your health goals. Please review the following plan we discussed and let me know if I can assist you in the future.   These are the goals we discussed:  Goals       Develop Plan of Care for Management of Hypertension (THN) (pt-stated)      Care Coordination Interventions: Evaluation of current treatment plan related to hypertension self management and patient's adherence to plan as established by provider Discussed plans with patient for ongoing care management follow up and provided patient with direct contact information for care management team Discussed complications of poorly controlled blood pressure such as heart disease, stroke, circulatory complications, vision complications, kidney impairment, sexual dysfunction Screening for signs and symptoms of depression related to chronic disease state  Assessed social determinant of health barriers Encouraged her to use her [employer] card to get a new BP monitor Discussed how pain or anxiety from rushing causes elevations on Blood pressure (BP)       Increase physical activity      manage back pain & headaches (THN) (pt-stated)      Care Coordination Interventions: Discussed importance of adherence to all scheduled medical appointments Counseled on the importance of reporting any/all new or changed pain symptoms or management strategies to pain management provider Reviewed with patient prescribed pharmacological and nonpharmacological pain relief strategies Discussed sciatica pain & pain related to use of statin medicines to manage hyperlipidemia- Discussed other options to manage cholesterol that reduces the side effects of statins (Nexlizet) Discussed how pain especially bilateral hip pain affects her blood pressure  Discussed reasons for headaches: cardiac (elevated BP, clogged arteries), allergies, visual  issues, stress/anxiety, injury to head/neck, neurological (nerves)/migraines Encouraged her to discuss the continued bilateral hip pain also with her MDs Assessed when she has headaches (mostly mornings)         This is a list of the screening recommended for you and due dates:  Health Maintenance  Topic Date Due   COVID-19 Vaccine (4 - 2023-24 season) 10/21/2021   Pneumonia Vaccine (2 - PCV) 06/25/2022*   Yearly kidney health urinalysis for diabetes  06/25/2022   Hemoglobin A1C  07/26/2022   Eye exam for diabetics  09/29/2022   Yearly kidney function blood test for diabetes  01/25/2023   Complete foot exam   01/25/2023   Medicare Annual Wellness Visit  03/03/2023   DTaP/Tdap/Td vaccine (2 - Td or Tdap) 12/23/2028   Flu Shot  Completed   DEXA scan (bone density measurement)  Completed   Zoster (Shingles) Vaccine  Completed   HPV Vaccine  Aged Out  *Topic was postponed. The date shown is not the original due date.    Advanced directives: Please bring a copy of your health care power of attorney and living will to the office to be added to your chart at your convenience.   Conditions/risks identified: Aim for 30 minutes of exercise or brisk walking, 6-8 glasses of water, and 5 servings of fruits and vegetables each day.   Next appointment: Follow up in one year for your annual wellness visit    Preventive Care 65 Years and Older, Female Preventive care refers to lifestyle choices and visits with your health care provider that can promote health and wellness. What does preventive care include? A yearly physical exam. This is also called an annual well check. Dental exams once or twice  a year. Routine eye exams. Ask your health care provider how often you should have your eyes checked. Personal lifestyle choices, including: Daily care of your teeth and gums. Regular physical activity. Eating a healthy diet. Avoiding tobacco and drug use. Limiting alcohol use. Practicing safe  sex. Taking low-dose aspirin every day. Taking vitamin and mineral supplements as recommended by your health care provider. What happens during an annual well check? The services and screenings done by your health care provider during your annual well check will depend on your age, overall health, lifestyle risk factors, and family history of disease. Counseling  Your health care provider may ask you questions about your: Alcohol use. Tobacco use. Drug use. Emotional well-being. Home and relationship well-being. Sexual activity. Eating habits. History of falls. Memory and ability to understand (cognition). Work and work Statistician. Reproductive health. Screening  You may have the following tests or measurements: Height, weight, and BMI. Blood pressure. Lipid and cholesterol levels. These may be checked every 5 years, or more frequently if you are over 87 years old. Skin check. Lung cancer screening. You may have this screening every year starting at age 43 if you have a 30-pack-year history of smoking and currently smoke or have quit within the past 15 years. Fecal occult blood test (FOBT) of the stool. You may have this test every year starting at age 97. Flexible sigmoidoscopy or colonoscopy. You may have a sigmoidoscopy every 5 years or a colonoscopy every 10 years starting at age 25. Hepatitis C blood test. Hepatitis B blood test. Sexually transmitted disease (STD) testing. Diabetes screening. This is done by checking your blood sugar (glucose) after you have not eaten for a while (fasting). You may have this done every 1-3 years. Bone density scan. This is done to screen for osteoporosis. You may have this done starting at age 76. Mammogram. This may be done every 1-2 years. Talk to your health care provider about how often you should have regular mammograms. Talk with your health care provider about your test results, treatment options, and if necessary, the need for more  tests. Vaccines  Your health care provider may recommend certain vaccines, such as: Influenza vaccine. This is recommended every year. Tetanus, diphtheria, and acellular pertussis (Tdap, Td) vaccine. You may need a Td booster every 10 years. Zoster vaccine. You may need this after age 42. Pneumococcal 13-valent conjugate (PCV13) vaccine. One dose is recommended after age 70. Pneumococcal polysaccharide (PPSV23) vaccine. One dose is recommended after age 60. Talk to your health care provider about which screenings and vaccines you need and how often you need them. This information is not intended to replace advice given to you by your health care provider. Make sure you discuss any questions you have with your health care provider. Document Released: 03/05/2015 Document Revised: 10/27/2015 Document Reviewed: 12/08/2014 Elsevier Interactive Patient Education  2017 [employer] Prevention in the Home Falls can cause injuries. They can happen to people of all ages. There are many things you can do to make your home safe and to help prevent falls. What can I do on the outside of my home? Regularly fix the edges of walkways and driveways and fix any cracks. Remove anything that might make you trip as you walk through a door, such as a raised step or threshold. Trim any bushes or trees on the path to your home. Use bright outdoor lighting. Clear any walking paths of anything that might make someone trip, such as rocks  or tools. Regularly check to see if handrails are loose or broken. Make sure that both sides of any steps have handrails. Any raised decks and porches should have guardrails on the edges. Have any leaves, snow, or ice cleared regularly. Use sand or salt on walking paths during winter. Clean up any spills in your garage right away. This includes oil or grease spills. What can I do in the bathroom? Use night lights. Install grab bars by the toilet and in the tub and shower.  Do not use towel bars as grab bars. Use non-skid mats or decals in the tub or shower. If you need to sit down in the shower, use a plastic, non-slip stool. Keep the floor dry. Clean up any water that spills on the floor as soon as it happens. Remove soap buildup in the tub or shower regularly. Attach bath mats securely with double-sided non-slip rug tape. Do not have throw rugs and other things on the floor that can make you trip. What can I do in the bedroom? Use night lights. Make sure that you have a light by your bed that is easy to reach. Do not use any sheets or blankets that are too big for your bed. They should not hang down onto the floor. Have a firm chair that has side arms. You can use this for support while you get dressed. Do not have throw rugs and other things on the floor that can make you trip. What can I do in the kitchen? Clean up any spills right away. Avoid walking on wet floors. Keep items that you use a lot in easy-to-reach places. If you need to reach something above you, use a strong step stool that has a grab bar. Keep electrical cords out of the way. Do not use floor polish or wax that makes floors slippery. If you must use wax, use non-skid floor wax. Do not have throw rugs and other things on the floor that can make you trip. What can I do with my stairs? Do not leave any items on the stairs. Make sure that there are handrails on both sides of the stairs and use them. Fix handrails that are broken or loose. Make sure that handrails are as long as the stairways. Check any carpeting to make sure that it is firmly attached to the stairs. Fix any carpet that is loose or worn. Avoid having throw rugs at the top or bottom of the stairs. If you do have throw rugs, attach them to the floor with carpet tape. Make sure that you have a light switch at the top of the stairs and the bottom of the stairs. If you do not have them, ask someone to add them for you. What else  can I do to help prevent falls? Wear shoes that: Do not have high heels. Have rubber bottoms. Are comfortable and fit you well. Are closed at the toe. Do not wear sandals. If you use a stepladder: Make sure that it is fully opened. Do not climb a closed stepladder. Make sure that both sides of the stepladder are locked into place. Ask someone to hold it for you, if possible. Clearly mark and make sure that you can see: Any grab bars or handrails. First and last steps. Where the edge of each step is. Use tools that help you move around (mobility aids) if they are needed. These include: Canes. Walkers. Scooters. Crutches. Turn on the lights when you go into a dark area.  Replace any light bulbs as soon as they burn out. Set up your furniture so you have a clear path. Avoid moving your furniture around. If any of your floors are uneven, fix them. If there are any pets around you, be aware of where they are. Review your medicines with your doctor. Some medicines can make you feel dizzy. This can increase your chance of falling. Ask your doctor what other things that you can do to help prevent falls. This information is not intended to replace advice given to you by your health care provider. Make sure you discuss any questions you have with your health care provider. Document Released: 12/03/2008 Document Revised: 07/15/2015 Document Reviewed: 03/13/2014 Elsevier Interactive Patient Education  2017 [employer].

## 2022-03-02 NOTE — Progress Notes (Signed)
Subjective:   [name] is a 82 y.o. female who presents for Medicare Annual (Subsequent) preventive examination. I connected with  MADHAVI HAMBLEN on 03/02/22 by a audio enabled telemedicine application and verified that I am speaking with the correct person using two identifiers.  Patient Location: Home  Provider Location: Home Office  I discussed the limitations of evaluation and management by telemedicine. The patient expressed understanding and agreed to proceed.  Review of Systems     Cardiac Risk Factors include: advanced age (>33mn, >>17women);hypertension;diabetes mellitus     Objective:    Today's Vitals   03/02/22 1325  Weight: 164 lb (74.4 kg)  Height: '5\' 4"'$  (1.626 m)   Body mass index is 28.15 kg/m.     03/02/2022    1:29 PM 12/14/2021   12:37 PM 09/16/2021    1:32 PM 02/16/2021   12:43 PM 01/09/2021    1:38 PM 03/08/2020   10:37 AM 02/26/2020    2:00 PM  Advanced Directives  Does Patient Have a Medical Advance Directive? No Yes Yes Yes No No No  Type of ACorporate treasurerof [place] will Living [place]    Does patient want to make changes to medical advance directive?  No - Patient declined No - Patient declined No - Patient declined     Copy of HFultonin Chart?  No - copy requested       Would patient like information on creating a medical advance directive? No - Patient declined  No - Patient declined  No - Patient declined No - Patient declined No - Patient declined    Current Medications (verified) Outpatient Encounter Medications as of 03/02/2022  Medication Sig   acetaminophen (TYLENOL) 500 MG tablet Take 1,000 mg by mouth every 6 (six) hours as needed for moderate pain.   Ascorbic Acid (VITAMIN C) 500 MG tablet Take 500 mg by mouth daily.     azithromycin (ZITHROMAX Z-PAK) 250 MG tablet Take two right away Then one a day for the next 4 days.   benzonatate (TESSALON) 200 MG  capsule Take 1 capsule (200 mg total) by mouth 3 (three) times daily as needed for cough.   bimatoprost (LUMIGAN) 0.01 % SOLN Place 1 drop into both eyes at bedtime.   Calcium Carbonate Antacid (TUMS PO) Take 2 tablets by mouth 4 (four) times daily as needed (for acid reflux/indigestion).    Camphor-Menthol-Methyl Sal (SALONPAS) 3.02-26-08 % PTCH Apply 1 patch topically daily as needed (pain).   cetirizine (ZYRTEC) 10 MG tablet Take 10 mg by mouth daily as needed for allergies.   Cyanocobalamin (VITAMIN B 12 PO) Take 1 tablet by mouth daily.   diclofenac Sodium (VOLTAREN) 1 % GEL Apply 2 g topically 4 (four) times daily. (Patient taking differently: Apply 2 g topically 4 (four) times daily as needed (pain).)   diltiazem (CARDIZEM CD) 240 MG 24 hr capsule TAKE ONE CAPSULE BY MOUTH DAILY   ELIQUIS 5 MG TABS tablet TAKE ONE TABLET BY MOUTH TWICE DAILY   GARLIC PO Take 1 tablet by mouth daily.   HYDROcodone-acetaminophen (NORCO) 5-325 MG tablet Take 1 tablet by mouth every 6 (six) hours as needed for moderate pain.   levothyroxine (SYNTHROID) 50 MCG tablet Take 1 tablet (50 mcg total) by mouth daily before breakfast.   losartan (COZAAR) 50 MG tablet Take 1 tablet (50 mg total) by mouth daily.   MAGNESIUM CARBONATE PO Take  1 tablet by mouth at bedtime.   metoprolol succinate (TOPROL-XL) 100 MG 24 hr tablet TAKE 1 TABLET BY MOUTH DAILY WITH FOOD   nitroGLYCERIN (NITROSTAT) 0.4 MG SL tablet Place 0.4 mg under the tongue every 5 (five) minutes as needed for chest pain.   rosuvastatin (CRESTOR) 20 MG tablet TAKE ONE (1) TABLET BY MOUTH EVERY DAY   timolol (TIMOPTIC) 0.5 % ophthalmic solution Place 1 drop into both eyes daily.   traZODone (DESYREL) 150 MG tablet Take 1 tablet (150 mg total) by mouth at bedtime.   TURMERIC PO Take 1 tablet by mouth daily.   VENTOLIN HFA 108 (90 Base) MCG/ACT inhaler Inhale 1-2 puffs into the lungs every 4 (four) hours as needed for shortness of breath.   Vitamin D,  Cholecalciferol, 10 MCG (400 UNIT) CAPS Take 400 Units by mouth daily.   No facility-administered encounter medications on file as of 03/02/2022.    Allergies (verified) Patient has no known allergies.   History: Past Medical History:  Diagnosis Date   Anxiety    Arthritis    Bulging lumbar disc    Chronic lower back pain    Coronary atherosclerosis of native coronary artery    DES LAD 02/2008, DES LAD 02/2015, DES ramus and DES x2 mid RCA 02/2020   Depression    Dyslipidemia    Essential hypertension    Facial numbness    Frequent headaches    GERD (gastroesophageal reflux disease)    Glaucoma    Heart attack ([place]) 02/2020   Insomnia    Paroxysmal atrial fibrillation ([place])    Diagnosed October 2019   PSVT (paroxysmal supraventricular tachycardia)    Refusal of blood transfusions as patient is Jehovah's Witness    Thyroid disease    Type 2 diabetes mellitus ([place])    Borderline   Vitamin D deficiency    Past Surgical History:  Procedure Laterality Date   ATRIAL FIBRILLATION ABLATION N/A 02/16/2021   Procedure: ATRIAL FIBRILLATION ABLATION;  Surgeon: Thompson Grayer, MD;  Location: [facility];  Service: Cardiovascular;  Laterality: N/A;   BIOPSY  09/21/2021   Procedure: BIOPSY;  Surgeon: Daneil Dolin, MD;  [facility];  Service: Endoscopy;;  gastric   CARDIAC CATHETERIZATION N/A 02/23/2015   Procedure: Left Heart Cath and Coronary Angiography;  Surgeon: Sherren Mocha, MD;  Location: [facility];  Service: Cardiovascular;  Laterality: N/A;   CARDIAC CATHETERIZATION N/A 02/23/2015   Procedure: Coronary Stent Intervention;  Surgeon: Sherren Mocha, MD;  Location: [facility];  Service: Cardiovascular;  Laterality: N/A;   CARDIAC CATHETERIZATION N/A 01/04/2016   Procedure: Left Heart Cath and Coronary Angiography;  Surgeon: Peter M Martinique, MD;  Location: [facility];  Service: Cardiovascular;  Laterality: N/A;   CATARACT EXTRACTION      CORONARY ANGIOPLASTY  02/23/2015   CORONARY ANGIOPLASTY WITH STENT PLACEMENT  2009   CORONARY STENT INTERVENTION N/A 03/09/2020   Procedure: CORONARY STENT INTERVENTION;  Surgeon: Jettie Booze, MD;  Location: [facility];  Service: Cardiovascular;  Laterality: N/A;   DILATION AND CURETTAGE OF UTERUS     ESOPHAGOGASTRODUODENOSCOPY (EGD) WITH PROPOFOL N/A 09/21/2021   normal esophagus, abnormal gastric mucosa s/p biopsy, normal duodenum. Pathology with H.pylori. Prescribed Prevpac.   implantable loop recorder placement  04/01/2020   Medtronic Reveal Linq model LNQ 22 [id number]) implantable loop recorder    LEFT HEART CATH AND CORONARY ANGIOGRAPHY N/A 03/09/2020   Procedure: LEFT HEART CATH  AND CORONARY ANGIOGRAPHY;  Surgeon: Jettie Booze, MD;  Location: [facility];  Service: Cardiovascular;  Laterality: N/A;   TUBAL LIGATION     Family History  Problem Relation Age of Onset   Other Mother        "natural causes"   Heart disease Mother    Cancer Father        unsure of type   Colon cancer Daughter        2011   Social History   Socioeconomic History   Marital status: Widowed    Spouse name: Not on file   Number of children: 8   Years of education: 10th grade   Highest education level: 10th grade  Occupational History   Occupation: RETIRED  Tobacco Use   Smoking status: Never   Smokeless tobacco: Never   Tobacco comments:    Never smoke 11/15/21  Vaping Use   Vaping Use: Never used  Substance and Sexual Activity   Alcohol use: Not Currently    Alcohol/week: 1.0 standard drink of alcohol    Types: 1 Glasses of wine per week    Comment: RARE OCCASION   Drug use: No   Sexual activity: Not Currently  Other Topics Concern   Not on file  Social History Narrative   Lives at home alone.   Right-handed.   No caffeine use.   Daughter lives next door and helps her out   Social Determinants of Health   Financial Resource Strain: Low Risk   (03/02/2022)   Overall Financial Resource Strain (CARDIA)    Difficulty of Paying Living Expenses: Not hard at all  Food Insecurity: No Food Insecurity (03/02/2022)   Hunger Vital Sign    Worried About Running Out of Food in the Last Year: Never true    Ran Out of Food in the Last Year: Never true  Transportation Needs: No Transportation Needs (03/02/2022)   PRAPARE - Hydrologist (Medical): No    Lack of Transportation (Non-Medical): No  Physical Activity: Inactive (03/02/2022)   Exercise Vital Sign    Days of Exercise per Week: 0 days    Minutes of Exercise per Session: 0 min  Stress: No Stress Concern Present (03/02/2022)   [place]    Feeling of Stress : Not at all  Social Connections: Moderately Isolated (03/02/2022)   Social Connection and Isolation Panel [NHANES]    Frequency of Communication with Friends and Family: More than three times a week    Frequency of Social Gatherings with Friends and Family: More than three times a week    Attends Religious Services: More than 4 times per year    Active Member of [employer] or Organizations: No    Attends Archivist Meetings: Never    Marital Status: Widowed    Tobacco Counseling Counseling given: Not Answered Tobacco comments: Never smoke 11/15/21   Clinical Intake:  Pre-visit preparation completed: Yes  Pain : No/denies pain     Nutritional Risks: None Diabetes: Yes CBG done?: No Did pt. bring in CBG monitor from home?: No  How often do you need to have someone help you when you read instructions, pamphlets, or other written materials from your doctor or pharmacy?: 1 - Never  Diabetic?yes  Nutrition Risk Assessment:  Has the patient had any N/V/D within the last 2 months?  No  Does the patient have any non-healing wounds?  No  Has the patient had any unintentional weight loss or weight gain?  No   Diabetes:  Is  the patient diabetic?  Yes  If diabetic, was a CBG obtained today?  No  Did the patient bring in their glucometer from home?  No  How often do you monitor your CBG's? Never .   Financial Strains and Diabetes Management:  Are you having any financial strains with the device, your supplies or your medication? No .  Does the patient want to be seen by Chronic Care Management for management of their diabetes?  No  Would the patient like to be referred to a Nutritionist or for Diabetic Management?  No   Diabetic Exams:  Diabetic Eye Exam: Completed 11/2021 Diabetic Foot Exam: Overdue, Pt has been advised about the importance in completing this exam. Pt is scheduled for diabetic foot exam on next office visit .   Interpreter Needed?: No  Information entered by :: Jadene Pierini, LPN   Activities of Daily Living    03/02/2022    1:29 PM 09/16/2021    1:36 PM  In your present state of health, do you have any difficulty performing the following activities:  Hearing? 0   Vision? 0   Difficulty concentrating or making decisions? 0   Walking or climbing stairs? 0   Dressing or bathing? 0   Doing errands, shopping? 0 0  Preparing Food and eating ? N   Using the Toilet? N   In the past six months, have you accidently leaked urine? N   Do you have problems with loss of bowel control? N   Managing your Medications? N   Managing your Finances? N   Housekeeping or managing your Housekeeping? N     Patient Care Team: Ivy Lynn, NP as PCP - General (Nurse Practitioner) Satira Sark, MD as PCP - Cardiology (Cardiology) Mealor, Yetta Barre, MD as PCP - Electrophysiology (Cardiology) Leta Baptist, MD as Consulting Physician (Otolaryngology) Lavera Guise, [facility] (Pharmacist) Brita Romp, NP as Nurse Practitioner (Endocrinology) Barbaraann Faster, RN as Independence Management  Indicate any recent Medical Services you may have received from other than [facility]  providers in the past year (date may be approximate).     Assessment:   This is a routine wellness examination for Coraima.  Hearing/Vision screen Vision Screening - Comments:: Wears rx glasses - up to date with routine eye exams with  Dr.groat   Dietary issues and exercise activities discussed: Current Exercise Habits: The patient does not participate in regular exercise at present   Goals Addressed             This Visit's Progress    Increase physical activity   On track      Depression Screen    03/02/2022    1:28 PM 01/24/2022    9:40 AM 11/08/2021    4:52 PM 03/04/2021    3:06 PM 01/18/2021   11:19 AM 12/03/2020    3:43 PM 12/03/2020    3:12 PM  PHQ 2/9 Scores  PHQ - 2 Score 0 '4 1 4 '$ 0 0 1  PHQ- 9 Score 0 '15  10 7 10     '$ Fall Risk    03/02/2022    1:26 PM 01/24/2022    9:40 AM 03/04/2021    3:06 PM 01/18/2021   11:16 AM 12/03/2020    3:12 PM  Fall Risk   Falls in the past year? 0 0 0 0  0  Number falls in past yr: 0 0     Injury with Fall? 0 0     Risk for fall due to : No Fall Risks No Fall Risks     Follow up Falls prevention discussed Education provided       FALL RISK PREVENTION PERTAINING TO THE HOME:  Any stairs in or around the home? No  If so, are there any without handrails?  no Home free of loose throw rugs in walkways, pet beds, electrical cords, etc? Yes  Adequate lighting in your home to reduce risk of falls? Yes   ASSISTIVE DEVICES UTILIZED TO PREVENT FALLS:  Life alert? Yes  Use of a cane, walker or w/c? No  Grab bars in the bathroom? Yes  Shower chair or bench in shower? Yes  Elevated toilet seat or a handicapped toilet? No        03/02/2022    1:29 PM 02/28/2021    2:14 PM 02/26/2020    2:07 PM  6CIT Screen  What Year? 0 points 0 points 0 points  What month? 0 points 0 points 0 points  What time? 0 points 0 points 0 points  Count back from 20 0 points 0 points 0 points  Months in reverse 0 points 2 points 0 points  Repeat phrase 0  points 2 points 4 points  Total Score 0 points 4 points 4 points    Immunizations Immunization History  Administered Date(s) Administered   Fluad Quad(high Dose 65+) 11/20/2019, 12/03/2020, 12/16/2021   Influenza Split 12/05/2005, 12/03/2006   Influenza, High Dose Seasonal PF 12/25/2016, 01/07/2018   Influenza,inj,quad, With Preservative 12/03/2015   Influenza-Unspecified 01/30/1995, 12/13/2000, 03/16/2003, 01/21/2015, 12/25/2016, 01/07/2018, 11/21/2018   Moderna Covid-19 Vaccine Bivalent Booster 82yr & up 01/18/2021   Moderna SARS-COV2 Booster Vaccination 12/31/2019, 07/04/2020   Moderna Sars-Covid-2 Vaccination 04/01/2019, 04/29/2019   Pneumococcal Polysaccharide-23 01/30/1995, 12/05/2005, 03/11/2020   Pneumococcal-Unspecified 12/24/2018   Tdap 12/24/2018   Zoster Recombinat (Shingrix) 03/04/2021, 06/24/2021    TDAP status: Up to date  Flu Vaccine status: Up to date  Pneumococcal vaccine status: Up to date  Covid-19 vaccine status: Completed vaccines  Qualifies for Shingles Vaccine? Yes   Zostavax completed Yes   Shingrix Completed?: Yes  Screening Tests Health Maintenance  Topic Date Due   COVID-19 Vaccine (4 - 2023-24 season) 10/21/2021   Pneumonia Vaccine 82 Years old (2 - PCV) 06/25/2022 (Originally 03/11/2021)   Diabetic kidney evaluation - Urine ACR  06/25/2022   HEMOGLOBIN A1C  07/26/2022   OPHTHALMOLOGY EXAM  09/29/2022   Diabetic kidney evaluation - eGFR measurement  01/25/2023   FOOT EXAM  01/25/2023   Medicare Annual Wellness (AWV)  03/03/2023   DTaP/Tdap/Td (2 - Td or Tdap) 12/23/2028   INFLUENZA VACCINE  Completed   DEXA SCAN  Completed   Zoster Vaccines- Shingrix  Completed   HPV VACCINES  Aged Out    Health Maintenance  Health Maintenance Due  Topic Date Due   COVID-19 Vaccine (4 - 2023-24 season) 10/21/2021    Colorectal cancer screening: No longer required.   Mammogram status: No longer required due to age.  Bone Density status:  Completed 12/03/2020. Results reflect: Bone density results: OSTEOPENIA. Repeat every 5 years.  Lung Cancer Screening: (Low Dose CT Chest recommended if Age 82-80years, 30 pack-year currently smoking OR have quit w/in 15years.) does not qualify.   Lung Cancer Screening Referral: n/a  Additional Screening:  Hepatitis C Screening: does not qualify;   Vision  Screening: Recommended annual ophthalmology exams for early detection of glaucoma and other disorders of the eye. Is the patient up to date with their annual eye exam?  Yes  Who is the provider or what is the name of the office in which the patient attends annual eye exams? Dr.Groat  If pt is not established with a provider, would they like to be referred to a provider to establish care? No .   Dental Screening: Recommended annual dental exams for proper oral hygiene  Community Resource Referral / Chronic Care Management: CRR required this visit?  No   CCM required this visit?  No      Plan:     I have personally reviewed and noted the following in the patient's chart:   Medical and social history Use of alcohol, tobacco or illicit drugs  Current medications and supplements including opioid prescriptions. Patient is not currently taking opioid prescriptions. Functional ability and status Nutritional status Physical activity Advanced directives List of other physicians Hospitalizations, surgeries, and ER visits in previous 12 months Vitals Screenings to include cognitive, depression, and falls Referrals and appointments  In addition, I have reviewed and discussed with patient certain preventive protocols, quality metrics, and best practice recommendations. A written personalized care plan for preventive services as well as general preventive health recommendations were provided to patient.     Daphane Shepherd, LPN   1/61/0960   Nurse Notes: none

## 2022-03-06 ENCOUNTER — Ambulatory Visit (INDEPENDENT_AMBULATORY_CARE_PROVIDER_SITE_OTHER): Payer: Medicare Other

## 2022-03-06 DIAGNOSIS — I4891 Unspecified atrial fibrillation: Secondary | ICD-10-CM

## 2022-03-08 LAB — CUP PACEART REMOTE DEVICE CHECK
Date Time Interrogation Session: 20240115230444
Implantable Pulse Generator Implant Date: 20220210

## 2022-04-05 DIAGNOSIS — H401131 Primary open-angle glaucoma, bilateral, mild stage: Secondary | ICD-10-CM | POA: Diagnosis not present

## 2022-04-06 ENCOUNTER — Ambulatory Visit (INDEPENDENT_AMBULATORY_CARE_PROVIDER_SITE_OTHER): Payer: 59

## 2022-04-06 DIAGNOSIS — I4891 Unspecified atrial fibrillation: Secondary | ICD-10-CM | POA: Diagnosis not present

## 2022-04-07 ENCOUNTER — Encounter: Payer: Self-pay | Admitting: Nurse Practitioner

## 2022-04-07 ENCOUNTER — Telehealth (INDEPENDENT_AMBULATORY_CARE_PROVIDER_SITE_OTHER): Payer: 59 | Admitting: Nurse Practitioner

## 2022-04-07 DIAGNOSIS — R051 Acute cough: Secondary | ICD-10-CM

## 2022-04-07 MED ORDER — BENZONATATE 200 MG PO CAPS: 200.0000 mg | ORAL_CAPSULE | Freq: Three times a day (TID) | ORAL | 0 refills | Status: DC | PRN

## 2022-04-07 MED ORDER — FLUTICASONE PROPIONATE 50 MCG/ACT NA SUSP: 2.0000 | Freq: Every day | NASAL | 6 refills | Status: DC

## 2022-04-07 NOTE — Progress Notes (Signed)
Virtual Visit Consent   [name], you are scheduled for a virtual visit with Mary-Margaret Hassell Done, [place], a [facility] provider, today.     Just as with appointments in the office, your consent must be obtained to participate.  Your consent will be active for this visit and any virtual visit you may have with one of our providers in the next 365 days.     If you have a MyChart account, a copy of this consent can be sent to you electronically.  All virtual visits are billed to your insurance company just like a traditional visit in the office.    As this is a virtual visit, video technology does not allow for your provider to perform a traditional examination.  This may limit your provider's ability to fully assess your condition.  If your provider identifies any concerns that need to be evaluated in person or the need to arrange testing (such as labs, EKG, etc.), we will make arrangements to do so.     Although advances in technology are sophisticated, we cannot ensure that it will always work on either your end or our end.  If the connection with a video visit is poor, the visit may have to be switched to a telephone visit.  With either a video or telephone visit, we are not always able to ensure that we have a secure connection.     I need to obtain your verbal consent now.   Are you willing to proceed with your visit today? YES   PHALLON CASSIE has provided verbal consent on 04/07/2022 for a virtual visit (video or telephone).   Mary-Margaret Hassell Done, FNP   Date: 04/07/2022 9:35 AM   Virtual Visit via Video Note   I, Mary-Margaret Hassell Done, connected with [name] ([id number], [birth year]) on 04/07/22 at  4:45 PM EST by a video-enabled telemedicine application and verified that I am speaking with the correct person using two identifiers.  Location: Patient: Virtual Visit Location Patient: Home Provider: Virtual Visit Location Provider: Mobile   I discussed the limitations of  evaluation and management by telemedicine and the availability of in person appointments. The patient expressed understanding and agreed to proceed.    History of Present Illness: [name] is a 82 y.o. who identifies as a female who was assigned female at birth, and is being seen today for cough and congestion.  HPI: Patient was on antibiotic last month for cogh. Got better and has come back  Cough This is a new problem. The current episode started in the past 7 days. The problem has been waxing and waning. The cough is Non-productive. Associated symptoms include rhinorrhea. Pertinent negatives include no chills or ear pain. Nothing aggravates the symptoms. She has tried OTC cough suppressant for the symptoms. The treatment provided mild relief.    Review of Systems  Constitutional:  Negative for chills.  HENT:  Positive for rhinorrhea. Negative for ear pain.   Respiratory:  Positive for cough.     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   Persistent atrial fibrillation ([place]) 07/15/2020   Low back pain 03/17/2020   Non-ST elevation (NSTEMI) myocardial infarction [facility])    Unstable angina ([place]) 03/08/2020   Hypothyroidism 02/06/2020   Paroxysmal atrial  fibrillation ([place]) 12/16/2019   Hypercoagulable state due to paroxysmal atrial fibrillation ([place]) 12/16/2019   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   Healthcare maintenance 10/02/2019   Anxiety with depression 10/02/2019   Numbness 04/02/2018   Numbness of tongue 04/02/2018   Neck pain on right side 04/02/2018   Diabetes mellitus without complication ([place]) [id number]   Atrial fibrillation with rapid ventricular response (HCC)    Chest pain in adult    Atrial fibrillation with RVR ([place]) 12/17/2017    Accelerating angina (HCC)    Exertional angina 02/23/2015   Chest pain 11/01/2012   Mixed hyperlipidemia 11/12/2009   Essential hypertension 11/12/2009   Coronary atherosclerosis of native coronary artery 09/25/2008   Pain of back and left lower extremity 02/19/2008    Allergies: No Known Allergies Medications:  Current Outpatient Medications:    acetaminophen (TYLENOL) 500 MG tablet, Take 1,000 mg by mouth every 6 (six) hours as needed for moderate pain., Disp: , Rfl:    Ascorbic Acid (VITAMIN C) 500 MG tablet, Take 500 mg by mouth daily.  , Disp: , Rfl:    azithromycin (ZITHROMAX Z-PAK) 250 MG tablet, Take two right away Then one a day for the next 4 days., Disp: 6 each, Rfl: 0   benzonatate (TESSALON) 200 MG capsule, Take 1 capsule (200 mg total) by mouth 3 (three) times daily as needed for cough., Disp: 20 capsule, Rfl: 0   bimatoprost (LUMIGAN) 0.01 % SOLN, Place 1 drop into both eyes at bedtime., Disp: , Rfl:    Calcium Carbonate Antacid (TUMS PO), Take 2 tablets by mouth 4 (four) times daily as needed (for acid reflux/indigestion). , Disp: , Rfl:    Camphor-Menthol-Methyl Sal (SALONPAS) 3.02-26-08 % PTCH, Apply 1 patch topically daily as needed (pain)., Disp: , Rfl:    cetirizine (ZYRTEC) 10 MG tablet, Take 10 mg by mouth daily as needed for allergies., Disp: , Rfl:    Cyanocobalamin (VITAMIN B 12 PO), Take 1 tablet by mouth daily., Disp: , Rfl:    diclofenac Sodium (VOLTAREN) 1 % GEL, Apply 2 g topically 4 (four) times daily. (Patient taking differently: Apply 2 g topically 4 (four) times daily as needed (pain).), Disp: 50 g, Rfl: 2   diltiazem (CARDIZEM CD) 240 MG 24 hr capsule, TAKE ONE CAPSULE BY MOUTH DAILY, Disp: 90 capsule, Rfl: 1   ELIQUIS 5 MG TABS tablet, TAKE ONE TABLET BY MOUTH TWICE DAILY, Disp: 60 tablet, Rfl: 5   GARLIC PO, Take 1 tablet by mouth daily., Disp: , Rfl:    HYDROcodone-acetaminophen (NORCO) 5-325 MG tablet, Take 1 tablet by mouth every 6 (six) hours as needed  for moderate pain., Disp: 30 tablet, Rfl: 0   levothyroxine (SYNTHROID) 50 MCG tablet, Take 1 tablet (50 mcg total) by mouth daily before breakfast., Disp: 90 tablet, Rfl: 3   losartan (COZAAR) 50 MG tablet, Take 1 tablet (50 mg total) by mouth daily., Disp: 90 tablet, Rfl: 3   MAGNESIUM CARBONATE PO, Take 1 tablet by mouth at bedtime., Disp: , Rfl:    metoprolol succinate (TOPROL-XL) 100 MG 24 hr tablet, TAKE 1 TABLET BY MOUTH DAILY WITH FOOD, Disp: 30 tablet, Rfl: 11   nitroGLYCERIN (NITROSTAT) 0.4 MG SL tablet, Place 0.4 mg under the tongue every 5 (five) minutes as needed for chest pain., Disp: , Rfl:    rosuvastatin (CRESTOR) 20 MG tablet, TAKE ONE (1) TABLET BY MOUTH EVERY DAY, Disp: 90 tablet, Rfl: 1   timolol (TIMOPTIC) 0.5 % ophthalmic solution, Place 1 drop  into both eyes daily., Disp: , Rfl:    traZODone (DESYREL) 150 MG tablet, Take 1 tablet (150 mg total) by mouth at bedtime., Disp: 90 tablet, Rfl: 1   TURMERIC PO, Take 1 tablet by mouth daily., Disp: , Rfl:    VENTOLIN HFA 108 (90 Base) MCG/ACT inhaler, Inhale 1-2 puffs into the lungs every 4 (four) hours as needed for shortness of breath., Disp: 18 g, Rfl: 1   Vitamin D, Cholecalciferol, 10 MCG (400 UNIT) CAPS, Take 400 Units by mouth daily., Disp: , Rfl:   Observations/Objective: Patient is well-developed, well-nourished in no acute distress.  Resting comfortably  at home.  Head is normocephalic, atraumatic.  No labored breathing.  Speech is clear and coherent with logical content.  Patient is alert and oriented at baseline.  Dry cough   Assessment and Plan:  IVI STANBRO in today with chief complaint of Cough   1. Acute cough 1. Take meds as prescribed 2. Use a cool mist humidifier especially during the winter months and when heat has been humid. 3. Use saline nose sprays frequently 4. Saline irrigations of the nose can be very helpful if done frequently.  * 4X daily for 1 week*  * Use of a nettie pot can be  helpful with this. Follow directions with this* 5. Drink plenty of fluids 6. Keep thermostat turn down low 7.For any cough or congestion- tessalon perles 8. For fever or aces or pains- take tylenol or ibuprofen appropriate for age and weight.  * for fevers greater than 101 orally you may alternate ibuprofen and tylenol every  3 hours.   Meds ordered this encounter  Medications   fluticasone (FLONASE) 50 MCG/ACT nasal spray    Sig: Place 2 sprays into both nostrils daily.    Dispense:  16 g    Refill:  6    Order Specific Question:   Supervising Provider    Answer:   Caryl Pina A [id number]   benzonatate (TESSALON) 200 MG capsule    Sig: Take 1 capsule (200 mg total) by mouth 3 (three) times daily as needed for cough.    Dispense:  20 capsule    Refill:  0    Order Specific Question:   Supervising Provider    Answer:   Caryl Pina A [id number]        Follow Up Instructions: I discussed the assessment and treatment plan with the patient. The patient was provided an opportunity to ask questions and all were answered. The patient agreed with the plan and demonstrated an understanding of the instructions.  A copy of instructions were sent to the patient via MyChart.  The patient was advised to call back or seek an in-person evaluation if the symptoms worsen or if the condition fails to improve as anticipated.  Time:  I spent 7 minutes with the patient via telehealth technology discussing the above problems/concerns.    Mary-Margaret Hassell Done, FNP

## 2022-04-07 NOTE — Patient Instructions (Signed)
[name], thank you for joining Julie Pretty, FNP for today's virtual visit.  While this provider is not your primary care provider (PCP), if your PCP is located in our provider database this encounter information will be shared with them immediately following your visit.   [facility] account gives you access to today's visit and all your visits, tests, and labs performed at [facility] " click here if you don't have a [facility] account or go to [email or web address]  Consent: (Patient) [name] provided verbal consent for this virtual visit at the beginning of the encounter.  Current Medications:  Current Outpatient Medications:    fluticasone (FLONASE) 50 MCG/ACT nasal spray, Place 2 sprays into both nostrils daily., Disp: 16 g, Rfl: 6   acetaminophen (TYLENOL) 500 MG tablet, Take 1,000 mg by mouth every 6 (six) hours as needed for moderate pain., Disp: , Rfl:    Ascorbic Acid (VITAMIN C) 500 MG tablet, Take 500 mg by mouth daily.  , Disp: , Rfl:    azithromycin (ZITHROMAX Z-PAK) 250 MG tablet, Take two right away Then one a day for the next 4 days., Disp: 6 each, Rfl: 0   benzonatate (TESSALON) 200 MG capsule, Take 1 capsule (200 mg total) by mouth 3 (three) times daily as needed for cough., Disp: 20 capsule, Rfl: 0   bimatoprost (LUMIGAN) 0.01 % SOLN, Place 1 drop into both eyes at bedtime., Disp: , Rfl:    Calcium Carbonate Antacid (TUMS PO), Take 2 tablets by mouth 4 (four) times daily as needed (for acid reflux/indigestion). , Disp: , Rfl:    Camphor-Menthol-Methyl Sal (SALONPAS) 3.02-26-08 % PTCH, Apply 1 patch topically daily as needed (pain)., Disp: , Rfl:    cetirizine (ZYRTEC) 10 MG tablet, Take 10 mg by mouth daily as needed for allergies., Disp: , Rfl:    Cyanocobalamin (VITAMIN B 12 PO), Take 1 tablet by mouth daily., Disp: , Rfl:    diclofenac Sodium (VOLTAREN) 1 % GEL, Apply 2 g topically 4 (four) times daily. (Patient  taking differently: Apply 2 g topically 4 (four) times daily as needed (pain).), Disp: 50 g, Rfl: 2   diltiazem (CARDIZEM CD) 240 MG 24 hr capsule, TAKE ONE CAPSULE BY MOUTH DAILY, Disp: 90 capsule, Rfl: 1   ELIQUIS 5 MG TABS tablet, TAKE ONE TABLET BY MOUTH TWICE DAILY, Disp: 60 tablet, Rfl: 5   GARLIC PO, Take 1 tablet by mouth daily., Disp: , Rfl:    HYDROcodone-acetaminophen (NORCO) 5-325 MG tablet, Take 1 tablet by mouth every 6 (six) hours as needed for moderate pain., Disp: 30 tablet, Rfl: 0   levothyroxine (SYNTHROID) 50 MCG tablet, Take 1 tablet (50 mcg total) by mouth daily before breakfast., Disp: 90 tablet, Rfl: 3   losartan (COZAAR) 50 MG tablet, Take 1 tablet (50 mg total) by mouth daily., Disp: 90 tablet, Rfl: 3   MAGNESIUM CARBONATE PO, Take 1 tablet by mouth at bedtime., Disp: , Rfl:    metoprolol succinate (TOPROL-XL) 100 MG 24 hr tablet, TAKE 1 TABLET BY MOUTH DAILY WITH FOOD, Disp: 30 tablet, Rfl: 11   nitroGLYCERIN (NITROSTAT) 0.4 MG SL tablet, Place 0.4 mg under the tongue every 5 (five) minutes as needed for chest pain., Disp: , Rfl:    rosuvastatin (CRESTOR) 20 MG tablet, TAKE ONE (1) TABLET BY MOUTH EVERY DAY, Disp: 90 tablet, Rfl: 1   timolol (TIMOPTIC) 0.5 % ophthalmic solution, Place 1 drop into both eyes daily., Disp: , Rfl:  traZODone (DESYREL) 150 MG tablet, Take 1 tablet (150 mg total) by mouth at bedtime., Disp: 90 tablet, Rfl: 1   TURMERIC PO, Take 1 tablet by mouth daily., Disp: , Rfl:    VENTOLIN HFA 108 (90 Base) MCG/ACT inhaler, Inhale 1-2 puffs into the lungs every 4 (four) hours as needed for shortness of breath., Disp: 18 g, Rfl: 1   Vitamin D, Cholecalciferol, 10 MCG (400 UNIT) CAPS, Take 400 Units by mouth daily., Disp: , Rfl:    Medications ordered in this encounter:  Meds ordered this encounter  Medications   fluticasone (FLONASE) 50 MCG/ACT nasal spray    Sig: Place 2 sprays into both nostrils daily.    Dispense:  16 g    Refill:  6    Order  Specific Question:   Supervising Provider    Answer:   Caryl Pina A [id number]   benzonatate (TESSALON) 200 MG capsule    Sig: Take 1 capsule (200 mg total) by mouth 3 (three) times daily as needed for cough.    Dispense:  20 capsule    Refill:  0    Order Specific Question:   Supervising Provider    Answer:   Caryl Pina A [id number]     *If you need refills on other medications prior to your next appointment, please contact your pharmacy*  Follow-Up: Call back or seek an in-person evaluation if the symptoms worsen or if the condition fails to improve as anticipated.  [place]  Other Instructions 1. Take meds as prescribed 2. Use a cool mist humidifier especially during the winter months and when heat has been humid. 3. Use saline nose sprays frequently 4. Saline irrigations of the nose can be very helpful if done frequently.  * 4X daily for 1 week*  * Use of a nettie pot can be helpful with this. Follow directions with this* 5. Drink plenty of fluids 6. Keep thermostat turn down low 7.For any cough or congestion- tessalon perles 8. For fever or aces or pains- take tylenol or ibuprofen appropriate for age and weight.  * for fevers greater than 101 orally you may alternate ibuprofen and tylenol every  3 hours.      If you have been instructed to have an in-person evaluation today at a local Urgent Care facility, please use the link below. It will take you to a list of all of our available [facility], including address, phone number and hours of operation. Please do not delay care.  [facility]  If you or a family member do not have a primary care provider, use the link below to schedule a visit and establish care. When you choose a [facility] primary care physician or advanced practice provider, you gain a long-term partner in health. Find a Primary Care Provider  Learn more about [facility]'s in-office and  virtual care options: [facility]

## 2022-04-09 LAB — CUP PACEART REMOTE DEVICE CHECK
Date Time Interrogation Session: 20240217230519
Implantable Pulse Generator Implant Date: 20220210

## 2022-04-20 ENCOUNTER — Ambulatory Visit (INDEPENDENT_AMBULATORY_CARE_PROVIDER_SITE_OTHER): Payer: 59 | Admitting: Family Medicine

## 2022-04-20 ENCOUNTER — Encounter: Payer: Self-pay | Admitting: Family Medicine

## 2022-04-20 VITALS — BP 146/81 | HR 72 | Temp 98.4°F | Ht 64.0 in | Wt 164.0 lb

## 2022-04-20 DIAGNOSIS — G8929 Other chronic pain: Secondary | ICD-10-CM

## 2022-04-20 DIAGNOSIS — F5101 Primary insomnia: Secondary | ICD-10-CM | POA: Diagnosis not present

## 2022-04-20 DIAGNOSIS — M79605 Pain in left leg: Secondary | ICD-10-CM

## 2022-04-20 DIAGNOSIS — I152 Hypertension secondary to endocrine disorders: Secondary | ICD-10-CM | POA: Diagnosis not present

## 2022-04-20 DIAGNOSIS — E119 Type 2 diabetes mellitus without complications: Secondary | ICD-10-CM | POA: Diagnosis not present

## 2022-04-20 DIAGNOSIS — M545 Low back pain, unspecified: Secondary | ICD-10-CM

## 2022-04-20 DIAGNOSIS — M549 Dorsalgia, unspecified: Secondary | ICD-10-CM | POA: Diagnosis not present

## 2022-04-20 DIAGNOSIS — I480 Paroxysmal atrial fibrillation: Secondary | ICD-10-CM

## 2022-04-20 DIAGNOSIS — I4891 Unspecified atrial fibrillation: Secondary | ICD-10-CM

## 2022-04-20 DIAGNOSIS — D6869 Other thrombophilia: Secondary | ICD-10-CM | POA: Diagnosis not present

## 2022-04-20 DIAGNOSIS — E1159 Type 2 diabetes mellitus with other circulatory complications: Secondary | ICD-10-CM

## 2022-04-20 LAB — BAYER DCA HB A1C WAIVED: HB A1C (BAYER DCA - WAIVED): 8.2 % — ABNORMAL HIGH (ref 4.8–5.6)

## 2022-04-20 MED ORDER — HYDROCODONE-ACETAMINOPHEN 5-325 MG PO TABS: 1.0000 | ORAL_TABLET | Freq: Four times a day (QID) | ORAL | 0 refills | Status: DC | PRN

## 2022-04-20 NOTE — Progress Notes (Signed)
Established Patient Office Visit  Subjective   Patient ID: [name], female    DOB: [birth year]  Age: 82 y.o. MRN: [id number]  Chief Complaint  Patient presents with   Medical Management of Chronic Issues    Re-establish care    Presents today with daughter, [name]   HPI 1. Type 2 diabetes mellitus with other circulatory complication, without long-term current use of insulin ([place]) States that she was on Metformin before. States "thyroid" doctor took her off of it sometime last year. States that she had side effect of metallic taste of tongue. Metallic taste is still present.   2. Atrial fibrillation with rapid ventricular response ([place]) 3. Hypercoagulable state due to paroxysmal atrial fibrillation (HCC) Has ILR. Follows with cardiology regularly. History of ablation. States that it worked for a while. Her son passed away and triggered another episode.  States episodes last for minutes. Symptoms of afib include flushing.   4. Chronic midline low back pain without sciatica States her lower back, hips, and legs hurt majority of the time. Pain with walking.   5. Insomnia.  States that she has frequent awakenings during the night. Goes to bed at 10:30, gets up at 9 am. Does not nap. Only drinks caffeine in the morning with her coffee. Takes trazodone every night, the whole pill. States that she started taking it years ago. States that she took Unisom some years ago and thought it was great.   ROS As per HPI   Objective:     BP (!) 146/81   Pulse 72   Temp 98.4 F (36.9 C)   Ht '5\' 4"'$  (1.626 m)   Wt 164 lb (74.4 kg)   LMP  (LMP Unknown)   SpO2 96%   BMI 28.15 kg/m    Physical Exam Constitutional:      General: She is not in acute distress.    Appearance: Normal appearance. She is not ill-appearing, toxic-appearing or diaphoretic.  Cardiovascular:     Rate and Rhythm: Normal rate.     Pulses: Normal pulses.     Heart sounds: Murmur heard.     No gallop.   Pulmonary:     Effort: Pulmonary effort is normal. No respiratory distress.     Breath sounds: Normal breath sounds. No stridor. No wheezing, rhonchi or rales.  Abdominal:     General: Abdomen is flat. Bowel sounds are normal.     Palpations: Abdomen is soft.  Skin:    General: Skin is warm.     Capillary Refill: Capillary refill takes less than 2 seconds.  Neurological:     General: No focal deficit present.     Mental Status: She is alert and oriented to person, place, and time. Mental status is at baseline.     Motor: No weakness.  Psychiatric:        Mood and Affect: Mood normal.        Behavior: Behavior normal.        Thought Content: Thought content normal.        Judgment: Judgment normal.    The ASCVD Risk score (Arnett DK, et al., 2019) failed to calculate for the following reasons:   The 2019 ASCVD risk score is only valid for ages 38 to 91   The patient has a prior MI or stroke diagnosis    Assessment & Plan:  1. Type 2 diabetes mellitus with other circulatory complication, without long-term current use of insulin (HCC) Labs as below. Referral  placed for chronic care management. Pt has follow up with endocrinology in May. Pt previously on Metformin and taken off. Appreciate recommendations from pharmacy and endo.  - Bayer DCA Hb A1c Waived - AMB Referral to Chronic Care Management Services  2. Atrial fibrillation with rapid ventricular response (HCC) Pt states she still has symptoms frequently from Afib. Discussed with her continuing cardiology plans and following up with them. Referral as below.  - AMB Referral to Chronic Care Management Services  3. Hypercoagulable state due to paroxysmal atrial fibrillation [facility]) Discussed maintaining a safe home to protect from falls and monitoring for signs of bleeding.   4. Hypertension associated with type 2 diabetes mellitus ([place]) Elevated BP today in office. Discussed with patient monitoring BP at home. Provided BP log to  patient in clinic today. Instructed pt to take BP first thing in the morning after sitting for 5 minutes with feet flat on the floor, arm at heart level. Discussed with patient options for BP cuff at [facility], [employer], [place]. Pt verbalized they were able to obtain BP cuff. Pt followed by Cardiology.   5. Chronic midline low back pain without sciatica 6. Pain of back and left lower extremity Medication as below. PDMP reviewed. Labs as below. Pt has been without Norco refill for >1 month.  - ToxASSURE Select 13 (MW), Urine - HYDROcodone-acetaminophen (NORCO) 5-325 MG tablet; Take 1 tablet by mouth every 6 (six) hours as needed for moderate pain.  Dispense: 30 tablet; Refill: 0 - HYDROcodone-acetaminophen (NORCO) 5-325 MG tablet; Take 1 tablet by mouth every 6 (six) hours as needed for moderate pain.  Dispense: 30 tablet; Refill: 0 - HYDROcodone-acetaminophen (NORCO) 5-325 MG tablet; Take 1 tablet by mouth every 6 (six) hours as needed for moderate pain.  Dispense: 30 tablet; Refill: 0  7. Primary insomnia Pt has order of trazodone. Discussed sleep hygiene and some OTC options. Discussed that we are limited in what we can provide given pt age and medical hx.   The above assessment and management plan was discussed with the patient. The patient verbalized understanding of and has agreed to the management plan using shared-decision making. Patient is aware to call the clinic if they develop any new symptoms or if symptoms fail to improve or worsen. Patient is aware when to return to the clinic for a follow-up visit. Patient educated on when it is appropriate to go to the emergency department.   Follow up in 2 weeks for Diabetes.  Donzetta Kohut, DNP-FNP [facility] [address] [place], [place] [address] [phone]

## 2022-04-24 ENCOUNTER — Telehealth: Payer: Self-pay

## 2022-04-24 NOTE — Progress Notes (Signed)
  Chronic Care Management   Note  04/24/2022 Name: [name] MRN: [id number] DOB: April 09, 1940  [name] is a 82 y.o. year old female who is a primary care patient of Jeneen Montgomery, Gilmore [place], [place]. I reached out to [name] by phone today in response to a referral sent by Ms. [place] PCP.  [name] was given information about Chronic Care Management services today including:  CCM service includes personalized support from designated clinical staff supervised by the physician, including individualized plan of care and coordination with other care providers 24/7 contact phone numbers for assistance for urgent and routine care needs. Service will only be billed when office clinical staff spend 20 minutes or more in a month to coordinate care. Only one practitioner may furnish and bill the service in a calendar month. The patient may stop CCM services at amy time (effective at the end of the month) by phone call to the office staff. The patient will be responsible for cost sharing (co-pay) or up to 20% of the service fee (after annual deductible is met)  Ms. Tamike Seggerman [facility]  agreedto scheduling an appointment with the CCM RN Case Manager   Follow up plan: Patient agreed to scheduled appointment with RN Case Manager on 04/27/2022 and pharm d 05/16/2022(date/time).   Noreene Larsson, [place], [place] [address] Direct Dial: [phone] [email or web address]

## 2022-04-25 LAB — TOXASSURE SELECT 13 (MW), URINE

## 2022-04-25 NOTE — Progress Notes (Deleted)
[address] [employer]

## 2022-04-25 NOTE — Progress Notes (Signed)
Carelink Summary Report / Loop Recorder 

## 2022-04-27 ENCOUNTER — Telehealth: Payer: 59

## 2022-05-02 ENCOUNTER — Ambulatory Visit (INDEPENDENT_AMBULATORY_CARE_PROVIDER_SITE_OTHER): Payer: 59 | Admitting: *Deleted

## 2022-05-02 DIAGNOSIS — I4891 Unspecified atrial fibrillation: Secondary | ICD-10-CM

## 2022-05-02 DIAGNOSIS — E1159 Type 2 diabetes mellitus with other circulatory complications: Secondary | ICD-10-CM

## 2022-05-02 NOTE — Plan of Care (Signed)
Chronic Care Management Provider Comprehensive Care Plan    05/02/2022 Name: [name] MRN: [id number] DOB: 02-Sep-1940  Referral to Chronic Care Management (CCM) services was placed by Provider:   Donzetta Kohut FNP  on Date: 04/20/22.  Chronic Condition 1: DIABETES Provider Assessment and Plan . Type 2 diabetes mellitus with other circulatory complication, without long-term current use of insulin (HCC) Labs as below. Referral placed for chronic care management. Pt has follow up with endocrinology in May. Pt previously on Metformin and taken off. Appreciate recommendations from pharmacy and endo.  - [facility] DCA Hb A1c Waived - AMB Referral to Chronic Care Management Services   Expected Outcome/Goals Addressed This Visit (Provider CCM goals/Provider Assessment and plan  CCM (DIABETES) EXPECTED OUTCOME: MONITOR, SELF- MANAGE AND REDUCE SYMPTOMS OF DIABETES  Symptom Management Condition 1: Take medications as prescribed   Attend all scheduled provider appointments Call pharmacy for medication refills 3-7 days in advance of running out of medications Attend church or other social activities Perform all self care activities independently  Call provider office for new concerns or questions  check blood sugar at prescribed times: per doctor's order  check feet daily for cuts, sores or redness enter blood sugar readings and medication or insulin into daily log take the blood sugar log to all doctor visits take the blood sugar meter to all doctor visits trim toenails straight across fill half of plate with vegetables limit fast food meals to no more than 1 per week manage portion size prepare main meal at home 3 to 5 days each week read food labels for fat, fiber, carbohydrates and portion size wash and dry feet carefully every day Look over education sent - hyperglycemia  Chronic Condition 2: ATRIAL FIBRILLATION Provider Assessment and Plan . Atrial fibrillation with rapid  ventricular response (HCC) Pt states she still has symptoms frequently from Afib. Discussed with her continuing cardiology plans and following up with them. Referral as below.  - AMB Referral to Chronic Care Management Services   Expected Outcome/Goals Addressed This Visit (Provider CCM goals/Provider Assessment and plan  CCM (ATRIAL FIBRILLATION) EXPECTED OUTCOME: MONITOR, SELF-MANAGE AND REDUCE SYMPTOMS OF ATRIAL FIBRILLATION  Symptom Management Condition 2: Take medications as prescribed   Attend all scheduled provider appointments Call pharmacy for medication refills 3-7 days in advance of running out of medications Attend church or other social activities Perform all self care activities independently  Call provider office for new concerns or questions  - check pulse (heart) rate before taking medicine - make a plan to exercise regularly - make a plan to eat healthy - keep all lab appointments - take medicine as prescribed - wear medical alert identification Look over education sent via my chart- Atrial fibrillation  Problem List Patient Active Problem List   Diagnosis Date Noted   Dyspepsia 08/18/2021   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   Hospital discharge follow-up 01/18/2021   Acute CHF (congestive heart failure) ([place]) 01/09/2021   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   Persistent atrial fibrillation ([place]) 07/15/2020   Low back pain 03/17/2020   Non-ST elevation (NSTEMI) myocardial infarction [facility])    Unstable angina ([place]) 03/08/2020   Hypothyroidism 02/06/2020   Paroxysmal atrial fibrillation ([place]) 12/16/2019   Hypercoagulable state due to paroxysmal atrial fibrillation ([place]) 12/16/2019   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   Healthcare maintenance 10/02/2019   Anxiety with depression 10/02/2019   Numbness 04/02/2018   Numbness of tongue 04/02/2018   Neck pain on right side 04/02/2018  Diabetes mellitus without complication ([place]) [id number]   Atrial  fibrillation with rapid ventricular response (HCC)    Chest pain in adult    Atrial fibrillation with RVR ([place]) 12/17/2017   Accelerating angina ([place])    Exertional angina 02/23/2015   Chest pain 11/01/2012   Hyperlipidemia associated with type 2 diabetes mellitus ([place]) 11/12/2009   Primary hypertension 11/12/2009   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  , Disp: , Rfl:    bimatoprost (LUMIGAN) 0.01 % SOLN, Place 1 drop into both eyes at bedtime., Disp: , Rfl:    Calcium Carbonate Antacid (TUMS PO), Take 2 tablets by mouth 4 (four) times daily as needed (for acid reflux/indigestion). , Disp: , Rfl:    Camphor-Menthol-Methyl Sal (SALONPAS) 3.02-26-08 % PTCH, Apply 1 patch topically daily as needed (pain)., Disp: , Rfl:    cetirizine (ZYRTEC) 10 MG tablet, Take 10 mg by mouth daily as needed for allergies., Disp: , Rfl:    Cyanocobalamin (VITAMIN B 12 PO), Take 1 tablet by mouth daily., Disp: , Rfl:    diclofenac Sodium (VOLTAREN) 1 % GEL, Apply 2 g topically 4 (four) times daily. (Patient taking differently: Apply 2 g topically 4 (four) times daily as needed (pain).), Disp: 50 g, Rfl: 2   diltiazem (CARDIZEM CD) 240 MG 24 hr capsule, TAKE ONE CAPSULE BY MOUTH DAILY, Disp: 90 capsule, Rfl: 1   ELIQUIS 5 MG TABS tablet, TAKE ONE TABLET BY MOUTH TWICE DAILY, Disp: 60 tablet, Rfl: 5   fluticasone (FLONASE) 50 MCG/ACT nasal spray, Place 2 sprays into both nostrils daily., Disp: 16 g, Rfl: 6   GARLIC PO, Take 1 tablet by mouth daily., Disp: , Rfl:    HYDROcodone-acetaminophen (NORCO) 5-325 MG tablet, Take 1 tablet by mouth  every 6 (six) hours as needed for moderate pain., Disp: 30 tablet, Rfl: 0   [START ON 05/20/2022] HYDROcodone-acetaminophen (NORCO) 5-325 MG tablet, Take 1 tablet by mouth every 6 (six) hours as needed for moderate pain., Disp: 30 tablet, Rfl: 0   [START ON 06/19/2022] HYDROcodone-acetaminophen (NORCO) 5-325 MG tablet, Take 1 tablet by mouth every 6 (six) hours as needed for moderate pain., Disp: 30 tablet, Rfl: 0   levothyroxine (SYNTHROID) 50 MCG tablet, Take 1 tablet (50 mcg total) by mouth daily before breakfast., Disp: 90 tablet, Rfl: 3   losartan (COZAAR) 50 MG tablet, Take 1 tablet (50 mg total) by mouth daily., Disp: 90 tablet, Rfl: 3   MAGNESIUM CARBONATE PO, Take 1 tablet by mouth at bedtime., Disp: , Rfl:    metoprolol succinate (TOPROL-XL) 100 MG 24 hr tablet, TAKE 1 TABLET BY MOUTH DAILY WITH FOOD, Disp: 30 tablet, Rfl: 11   nitroGLYCERIN (NITROSTAT) 0.4 MG SL tablet, Place 0.4 mg under the tongue every 5 (five) minutes as needed for chest pain., Disp: , Rfl:    rosuvastatin (CRESTOR) 20 MG tablet, TAKE ONE (1) TABLET BY MOUTH EVERY DAY, Disp: 90 tablet, Rfl: 1   timolol (TIMOPTIC) 0.5 % ophthalmic solution, Place 1 drop into both eyes daily., Disp: , Rfl:    traZODone (DESYREL) 150 MG tablet,  Take 1 tablet (150 mg total) by mouth at bedtime., Disp: 90 tablet, Rfl: 1   TURMERIC PO, Take 1 tablet by mouth daily., Disp: , Rfl:    VENTOLIN HFA 108 (90 Base) MCG/ACT inhaler, Inhale 1-2 puffs into the lungs every 4 (four) hours as needed for shortness of breath., Disp: 18 g, Rfl: 1   Vitamin D, Cholecalciferol, 10 MCG (400 UNIT) CAPS, Take 400 Units by mouth daily., Disp: , Rfl:    benzonatate (TESSALON) 200 MG capsule, Take 1 capsule (200 mg total) by mouth 3 (three) times daily as needed for cough. (Patient not taking: Reported on 04/20/2022), Disp: 20 capsule, Rfl: 0  Cognitive Assessment Identity Confirmed: : Name; DOB Cognitive Status: Normal   Functional Assessment Hearing Difficulty  or Deaf: no Wear Glasses or Blind: yes Vision Management: reading glasses only Concentrating, Remembering or Making Decisions Difficulty (CP): no Difficulty Communicating: no Difficulty Eating/Swallowing: no Walking or Climbing Stairs Difficulty: no Dressing/Bathing Difficulty: no Doing Errands Independently Difficulty (such as shopping) (CP): yes Errands Management: daughter provides transportation  pt has never driven   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(ren) Name of Support/Comfort Primary Source: daughter Kynia Dickenson People in Home: alone   Planned Interventions  Provided education to patient about basic DM disease process; Reviewed medications with patient and discussed importance of medication adherence;        Reviewed prescribed diet with patient carbohydrate modified; Counseled on importance of regular laboratory monitoring as prescribed;        Discussed plans with patient for ongoing care management follow up and provided patient with direct contact information for care management team;      Provided patient with written educational materials related to hypo and hyperglycemia and importance of correct treatment;       Advised patient, providing education and rationale, to check cbg per doctor's order  and record        call provider for findings outside established parameters;       Review of patient status, including review of consultants reports, relevant laboratory and other test results, and medications completed;       Screening for signs and symptoms of depression related to chronic disease state;        Assessed social determinant of health barriers;        Encouraged pt to have her granddaughter check CBG and record Reviewed importance of adherence to anticoagulant exactly as prescribed Advised patient to discuss issues with heart rate, exacerbation of A-fib with provider Counseled on importance of regular laboratory monitoring as  prescribed Afib action plan reviewed Screening for signs and symptoms of depression related to chronic disease state Assessed social determinant of health barriers Reviewed upcoming scheduled appointments Atrial fibrillation education sent via my chart  Interaction and coordination with outside resources, practitioners, and providers See CCM Referral  Care Plan: Available in MyChart

## 2022-05-02 NOTE — Patient Instructions (Signed)
Please call the care guide team at [phone] if you need to cancel or reschedule your appointment.   If you are experiencing a Mental Health or [place] or need someone to talk to, please call the Suicide and Crisis Lifeline: 988 call the [place] National Suicide Prevention Lifeline: 951-166-3938 or TTY: (216) 334-2386 TTY 385-884-7882) to talk to a trained counselor call 1-800-273-TALK (toll free, 24 hour hotline) go to [facility] [address], [place] [phone]) call the [facility]: [phone] call 911   Following is a copy of the CCM Program Consent:  CCM service includes personalized support from designated clinical staff supervised by the physician, including individualized plan of care and coordination with other care providers 24/7 contact phone numbers for assistance for urgent and routine care needs. Service will only be billed when office clinical staff spend 20 minutes or more in a month to coordinate care. Only one practitioner may furnish and bill the service in a calendar month. The patient may stop CCM services at amy time (effective at the end of the month) by phone call to the office staff. The patient will be responsible for cost sharing (co-pay) or up to 20% of the service fee (after annual deductible is met)  Following is a copy of your full provider care plan:   Goals Addressed             This Visit's Progress    CCM (ATRIAL FIBRILLATION) EXPECTED OUTCOME: MONITOR, SELF-MANAGE AND REDUCE SYMPTOMS OF ATRIAL FIBRILLATION       Current Barriers:  Knowledge Deficits related to Atrial fibrillation management Chronic Disease Management support and education needs related to Atrial fibrillation Patient reports she has all medications and taking as prescribed including blood thinner Patient reports she has a cane to use and plans to discuss obtaining a walker with primary care provider on  05/09/22 appointment  Planned Interventions: Reviewed importance of adherence to anticoagulant exactly as prescribed Advised patient to discuss issues with heart rate, exacerbation of A-fib with provider Counseled on importance of regular laboratory monitoring as prescribed Afib action plan reviewed Screening for signs and symptoms of depression related to chronic disease state Assessed social determinant of health barriers Reviewed upcoming scheduled appointments Atrial fibrillation education sent via my chart  Symptom Management: Take medications as prescribed   Attend all scheduled provider appointments Call pharmacy for medication refills 3-7 days in advance of running out of medications Attend church or other social activities Perform all self care activities independently  Call provider office for new concerns or questions  - check pulse (heart) rate before taking medicine - make a plan to exercise regularly - make a plan to eat healthy - keep all lab appointments - take medicine as prescribed - wear medical alert identification Look over education sent via my chart- Atrial fibrillation  Follow Up Plan: Telephone follow up appointment with care management team member scheduled for:  07/11/22 at 130 pm       CCM (DIABETES) EXPECTED OUTCOME: MONITOR, SELF- MANAGE AND REDUCE SYMPTOMS OF DIABETES       Current Barriers:  Knowledge Deficits related to Diabetes management Care Coordination needs related to medication management in a patient with Diabetes Chronic Disease Management support and education needs related to Diabetes, diet Patient reports she lives alone, has lots of support from her adult daughter and family, pt has never driven and daughter transports pt to all appointments and running errands, etc. Patient reports she used  to check CBG years ago and not interested in checking now, does not like needles, states she was taken off metformin and no longer on any medication  for diabetes, AIC increased to 8.2 up from 6.4 and 7.4 over past 2 years Patient reports her granddaughter is willing to check CBG and pt sees her frequently  Planned Interventions: Provided education to patient about basic DM disease process; Reviewed medications with patient and discussed importance of medication adherence;        Reviewed prescribed diet with patient carbohydrate modified; Counseled on importance of regular laboratory monitoring as prescribed;        Discussed plans with patient for ongoing care management follow up and provided patient with direct contact information for care management team;      Provided patient with written educational materials related to hypo and hyperglycemia and importance of correct treatment;       Advised patient, providing education and rationale, to check cbg per doctor's order  and record        call provider for findings outside established parameters;       Review of patient status, including review of consultants reports, relevant laboratory and other test results, and medications completed;       Screening for signs and symptoms of depression related to chronic disease state;        Assessed social determinant of health barriers;        Encouraged pt to have her granddaughter check CBG and record  Symptom Management: Take medications as prescribed   Attend all scheduled provider appointments Call pharmacy for medication refills 3-7 days in advance of running out of medications Attend church or other social activities Perform all self care activities independently  Call provider office for new concerns or questions  check blood sugar at prescribed times: per doctor's order  check feet daily for cuts, sores or redness enter blood sugar readings and medication or insulin into daily log take the blood sugar log to all doctor visits take the blood sugar meter to all doctor visits trim toenails straight across fill half of plate with  vegetables limit fast food meals to no more than 1 per week manage portion size prepare main meal at home 3 to 5 days each week read food labels for fat, fiber, carbohydrates and portion size wash and dry feet carefully every day Look over education sent - hyperglycemia  Follow Up Plan: Telephone follow up appointment with care management team member scheduled for: 07/11/22 at 130 pm          Patient verbalizes understanding of instructions and care plan provided today and agrees to view in [place]. Active MyChart status and patient understanding of how to access instructions and care plan via MyChart confirmed with patient.     Telephone follow up appointment with care management team member scheduled for:  07/11/22 at 130 pm  Atrial Fibrillation Atrial fibrillation (AFib) is a type of heartbeat that is irregular or fast. If you have AFib, your heart beats without any order. This makes it hard for your heart to pump blood in a normal way. AFib may come and go, or it may become a long-lasting problem. If AFib is not treated, it can put you at higher risk for stroke, heart failure, and other heart problems. What are the causes? AFib may be caused by diseases that damage the heart's electrical system. They include: High blood pressure. Heart failure. Heart valve diseases. Heart surgery. Diabetes. Thyroid disease.  Kidney disease. Lung diseases, such as pneumonia or COPD. Sleep apnea. Sometimes the cause is not known. What increases the risk? You are more likely to develop AFib if: You are older. You exercise often and very hard. You have a family history of AFib. You are female. You are Caucasian. You are overweight. You smoke. You drink a lot of alcohol. What are the signs or symptoms? Common symptoms of this condition include: A feeling that your heart is beating very fast. Chest pain or discomfort. Feeling short of breath. Suddenly feeling light-headed or weak. Getting  tired easily during activity. Fainting. Sweating. In some cases, there are no symptoms. How is this treated? Medicines to: Prevent blood clots. Treat heart rate or heart rhythm problems. Using devices, such as a pacemaker, to correct heart rhythm problems. Doing surgery to remove the part of the heart that sends bad signals. Closing an area where clots can form in the heart (left atrial appendage). In some cases, your doctor will treat other underlying conditions. Follow these instructions at home: Medicines Take over-the-counter and prescription medicines only as told by your doctor. Do not take any new medicines without first talking to your doctor. If you are taking blood thinners: Talk with your doctor before taking aspirin or NSAIDs, such as ibuprofen. Take your medicines as told. Take them at the same time each day. Do not do things that could hurt or bruise you. Be careful to avoid falls. Wear an alert bracelet or carry a card that says you take blood thinners. Lifestyle Do not smoke or use any products that contain nicotine or tobacco. If you need help quitting, ask your doctor. Eat heart-healthy foods. Talk with your doctor about the right eating plan for you. Exercise regularly as told by your doctor. Do not drink alcohol. Lose weight if you are overweight. General instructions If you have sleep apnea, treat it as told by your doctor. Do not use diet pills unless your doctor says they are safe for you. Diet pills may make heart problems worse. Keep all follow-up visits. Your doctor will check your heart rate and rhythm regularly. Contact a doctor if: You notice a change in the speed, rhythm, or strength of your heartbeat. You are taking a blood-thinning medicine and you get more bruising. You get tired more easily when you move or exercise. You have a sudden change in weight. Get help right away if:  You have pain in your chest. You have trouble breathing. You have  side effects of blood thinners, such as blood in your vomit, poop (stool), or pee (urine), or bleeding that cannot stop. You have any signs of a stroke. "BE FAST" is an easy way to remember the main warning signs: B - Balance. Dizziness, sudden trouble walking, or loss of balance. E - Eyes. Trouble seeing or a change in how you see. F - Face. Sudden weakness or loss of feeling in the face. The face or eyelid may droop on one side. A - Arms.Weakness or loss of feeling in an arm. This happens suddenly and usually on one side of the body. S - Speech. Sudden trouble speaking, slurred speech, or trouble understanding what people say. T - Time.Time to call emergency services. Write down what time symptoms started. You have other signs of a stroke, such as: A sudden, very bad headache with no known cause. Feeling like you may vomit (nausea). Vomiting. A seizure. These symptoms may be an emergency. Get help right away. Call 911. Do  not wait to see if the symptoms will go away. Do not drive yourself to the hospital. This information is not intended to replace advice given to you by your health care provider. Make sure you discuss any questions you have with your health care provider. Document Revised: 10/26/2021 Document Reviewed: 10/26/2021 Elsevier Patient Education  [place]. Hyperglycemia Hyperglycemia is when the sugar (glucose) level in your blood is too high. High blood sugar can happen to people who have or do not have diabetes. High blood sugar can happen quickly. It can be an emergency. What are the causes? If you have diabetes, high blood sugar may be caused by: Medicines that increase blood sugar or affect your control of diabetes. Getting less physical activity. Overeating. Being sick or injured or having an infection. Having surgery. Stress. Not giving yourself enough insulin (if you are taking it). You may have high blood sugar because you have diabetes that has not been  diagnosed yet. If you do not have diabetes, high blood sugar may be caused by: Certain medicines. Stress. A bad illness. An infection. Having surgery. Diseases of the pancreas. What increases the risk? This condition is more likely to develop in people who have risk factors for diabetes, such as: Having a family member with diabetes. Certain conditions in which the body's defense system (immune system) attacks itself. These are called autoimmune disorders. Being overweight. Not being active. Having a condition called insulin resistance. Having a history of: Prediabetes. Diabetes when pregnant. Polycystic ovarian syndrome (PCOS). What are the signs or symptoms? This condition may not cause symptoms. If you do have symptoms, they may include: Feeling more thirsty than normal. Needing to pee (urinate) more often than normal. Hunger. Feeling very tired. Blurry eyesight (vision). You may get other symptoms as the condition gets worse, such as: Dry mouth. Pain in your belly (abdomen). Not being hungry (loss of appetite). Breath that smells fruity. Weakness. Weight loss that is not planned. A tingling or numb feeling in your hands or feet. A headache. Cuts or bruises that heal slowly. How is this treated? Treatment depends on the cause of your condition. Treatment may include: Taking medicine to control your blood sugar levels. Changing your medicine or dosage if you take insulin or other diabetes medicines. Lifestyle changes. These may include: Exercising more. Eating healthier foods. Losing weight. Treating an illness or infection. Checking your blood sugar more often. Stopping or reducing steroid medicines. If your condition gets very bad, you will need to be treated in the hospital. Follow these instructions at home: General instructions Take over-the-counter and prescription medicines only as told by your doctor. Do not smoke or use any products that contain nicotine  or tobacco. If you need help quitting, ask your doctor. If you drink alcohol: Limit how much you have to: 0-1 drink a day for women who are not pregnant. 0-2 drinks a day for men. Know how much alcohol is in a drink. In the [place] one drink equals one 12 oz bottle of beer (355 mL), one 5 oz glass of wine (148 mL), or one 1 oz glass of hard liquor (44 mL). Manage stress. If you need help with this, ask your doctor. Do exercises as told by your doctor. Keep all follow-up visits. Eating and drinking  Stay at a healthy weight. Make sure you drink enough fluid when you: Exercise. Get sick. Are in hot temperatures. Drink enough fluid to keep your pee (urine) pale yellow. If you have diabetes:  Know the symptoms of high blood sugar. Follow your diabetes management plan as told by your doctor. Make sure you: Take insulin and medicines as told. Follow your exercise plan. Follow your meal plan. Eat on time. Do not skip meals. Check your blood sugar as often as told. Make sure you check before and after exercise. If you exercise longer or in a different way, check your blood sugar more often. Follow your sick day plan whenever you cannot eat or drink normally. Make this plan ahead of time with your doctor. Share your diabetes management plan with people in your workplace, school, and household. Check your pee for ketones when you are ill and as told by your doctor. Carry a card or wear jewelry that says that you have diabetes. Where to find more information American Diabetes Association: www.diabetes.org Contact a doctor if: Your blood sugar level is at or above 240 mg/dL (13.3 mmol/L) for 2 days in a row. You have problems keeping your blood sugar in your target range. You have high blood pressure often. You have signs of illness, such as: Feeling like you may vomit (feeling nauseous). Vomiting. A fever. Get help right away if: Your blood sugar monitor reads "high" even when you are  taking insulin. You have trouble breathing. You have a change in how you think, feel, or act (mental status). You feel like you may vomit, and the feeling does not go away. You cannot stop vomiting. These symptoms may be an emergency. Get medical help right away. Call your local emergency services (911 in the [place] Do not wait to see if the symptoms will go away. Do not drive yourself to the hospital. Summary Hyperglycemia is when the sugar (glucose) level in your blood is too high. High blood sugar can happen to people who have or do not have diabetes. Make sure you drink enough fluids and follow your meal plan. Exercise as often as told by your doctor. Contact your doctor if you have problems keeping your blood sugar in your target range. This information is not intended to replace advice given to you by your health care provider. Make sure you discuss any questions you have with your health care provider. Document Revised: 11/21/2019 Document Reviewed: 11/21/2019 Elsevier Patient Education  [place].

## 2022-05-02 NOTE — Chronic Care Management (AMB) (Signed)
Chronic Care Management   CCM RN Visit Note  05/02/2022 Name: [name] MRN: [id number] DOB: 06-28-1940  Subjective: [name] is a 82 y.o. year old female who is a primary care patient of [name], [name], [name]. The patient was referred to the Chronic Care Management team for assistance with care management needs subsequent to provider initiation of CCM services and plan of care.    Today's Visit:  Engaged with patient by telephone for initial visit.     SDOH Interventions Today    Flowsheet Row Most Recent Value  SDOH Interventions   Food Insecurity Interventions Intervention Not Indicated  Housing Interventions Intervention Not Indicated  Transportation Interventions Intervention Not Indicated  Utilities Interventions Intervention Not Indicated  Financial Strain Interventions Intervention Not Indicated  Physical Activity Interventions Patient Refused  [back and pain issues limits ability to exercise]  Stress Interventions Intervention Not Indicated  Social Connections Interventions Intervention Not Indicated         Goals Addressed             This Visit's Progress    CCM (ATRIAL FIBRILLATION) EXPECTED OUTCOME: MONITOR, SELF-MANAGE AND REDUCE SYMPTOMS OF ATRIAL FIBRILLATION       Current Barriers:  Knowledge Deficits related to Atrial fibrillation management Chronic Disease Management support and education needs related to Atrial fibrillation Patient reports she has all medications and taking as prescribed including blood thinner Patient reports she has a cane to use and plans to discuss obtaining a walker with primary care provider on 05/09/22 appointment  Planned Interventions: Reviewed importance of adherence to anticoagulant exactly as prescribed Advised patient to discuss issues with heart rate, exacerbation of A-fib with provider Counseled on importance of regular laboratory monitoring as prescribed Afib action plan reviewed Screening for signs  and symptoms of depression related to chronic disease state Assessed social determinant of health barriers Reviewed upcoming scheduled appointments Atrial fibrillation education sent via my chart  Symptom Management: Take medications as prescribed   Attend all scheduled provider appointments Call pharmacy for medication refills 3-7 days in advance of running out of medications Attend church or other social activities Perform all self care activities independently  Call provider office for new concerns or questions  - check pulse (heart) rate before taking medicine - make a plan to exercise regularly - make a plan to eat healthy - keep all lab appointments - take medicine as prescribed - wear medical alert identification Look over education sent via my chart- Atrial fibrillation  Follow Up Plan: Telephone follow up appointment with care management team member scheduled for:  07/11/22 at 130 pm       CCM (DIABETES) EXPECTED OUTCOME: MONITOR, SELF- MANAGE AND REDUCE SYMPTOMS OF DIABETES       Current Barriers:  Knowledge Deficits related to Diabetes management Care Coordination needs related to medication management in a patient with Diabetes Chronic Disease Management support and education needs related to Diabetes, diet Patient reports she lives alone, has lots of support from her adult daughter and family, pt has never driven and daughter transports pt to all appointments and running errands, etc. Patient reports she used to check CBG years ago and not interested in checking now, does not like needles, states she was taken off metformin and no longer on any medication for diabetes, AIC increased to 8.2 up from 6.4 and 7.4 over past 2 years Patient reports her granddaughter is willing to check CBG and pt sees her frequently  Planned Interventions: Provided education  to patient about basic DM disease process; Reviewed medications with patient and discussed importance of medication  adherence;        Reviewed prescribed diet with patient carbohydrate modified; Counseled on importance of regular laboratory monitoring as prescribed;        Discussed plans with patient for ongoing care management follow up and provided patient with direct contact information for care management team;      Provided patient with written educational materials related to hypo and hyperglycemia and importance of correct treatment;       Advised patient, providing education and rationale, to check cbg per doctor's order  and record        call provider for findings outside established parameters;       Review of patient status, including review of consultants reports, relevant laboratory and other test results, and medications completed;       Screening for signs and symptoms of depression related to chronic disease state;        Assessed social determinant of health barriers;        Encouraged pt to have her granddaughter check CBG and record  Symptom Management: Take medications as prescribed   Attend all scheduled provider appointments Call pharmacy for medication refills 3-7 days in advance of running out of medications Attend church or other social activities Perform all self care activities independently  Call provider office for new concerns or questions  check blood sugar at prescribed times: per doctor's order  check feet daily for cuts, sores or redness enter blood sugar readings and medication or insulin into daily log take the blood sugar log to all doctor visits take the blood sugar meter to all doctor visits trim toenails straight across fill half of plate with vegetables limit fast food meals to no more than 1 per week manage portion size prepare main meal at home 3 to 5 days each week read food labels for fat, fiber, carbohydrates and portion size wash and dry feet carefully every day Look over education sent - hyperglycemia  Follow Up Plan: Telephone follow up  appointment with care management team member scheduled for: 07/11/22 at 130 pm          Plan:Telephone follow up appointment with care management team member scheduled for:  07/11/22 at 130 pm  [name] [facility], BSN RN Case Manager [place] [phone]

## 2022-05-04 ENCOUNTER — Ambulatory Visit: Payer: 59 | Admitting: Family Medicine

## 2022-05-04 ENCOUNTER — Other Ambulatory Visit: Payer: Self-pay | Admitting: Cardiology

## 2022-05-04 NOTE — Progress Notes (Signed)
Carelink Summary Report / Loop Recorder 

## 2022-05-08 ENCOUNTER — Ambulatory Visit (INDEPENDENT_AMBULATORY_CARE_PROVIDER_SITE_OTHER): Payer: 59

## 2022-05-08 DIAGNOSIS — I4891 Unspecified atrial fibrillation: Secondary | ICD-10-CM

## 2022-05-09 ENCOUNTER — Ambulatory Visit (INDEPENDENT_AMBULATORY_CARE_PROVIDER_SITE_OTHER): Payer: 59 | Admitting: Family Medicine

## 2022-05-09 ENCOUNTER — Encounter: Payer: Self-pay | Admitting: Family Medicine

## 2022-05-09 VITALS — BP 162/75 | HR 72 | Temp 98.0°F | Ht 64.0 in | Wt 159.0 lb

## 2022-05-09 DIAGNOSIS — E1159 Type 2 diabetes mellitus with other circulatory complications: Secondary | ICD-10-CM | POA: Diagnosis not present

## 2022-05-09 DIAGNOSIS — F5101 Primary insomnia: Secondary | ICD-10-CM | POA: Diagnosis not present

## 2022-05-09 DIAGNOSIS — I152 Hypertension secondary to endocrine disorders: Secondary | ICD-10-CM | POA: Diagnosis not present

## 2022-05-09 DIAGNOSIS — Z7984 Long term (current) use of oral hypoglycemic drugs: Secondary | ICD-10-CM | POA: Diagnosis not present

## 2022-05-09 LAB — CUP PACEART REMOTE DEVICE CHECK
Date Time Interrogation Session: 20240317231354
Implantable Pulse Generator Implant Date: 20220210

## 2022-05-09 MED ORDER — METFORMIN HCL 500 MG PO TABS: 500.0000 mg | ORAL_TABLET | Freq: Two times a day (BID) | ORAL | 3 refills | Status: DC

## 2022-05-09 NOTE — Progress Notes (Signed)
Acute Office Visit  Subjective:  Patient ID: [name], female    DOB: Jan 29, 1941, 82 y.o.   MRN: [id number]  Chief Complaint  Patient presents with   Medical Management of Chronic Issues    2 week f/u, elevated A1C   HPI Patient is in today for BP and BG follow up  States that her monitor at home is higher. 160/72. Took in the morning before and after breakfast. Denies symptoms of HA, chest pain, sob, and vision changes  BG  Has not been taking her BG. Has supplies at home, granddaughter comes to help her.   Weight loss  States that she has lost 5lbs in 2 weeks due to stress.   Metallic Taste  Has seen ENT, neuro, dentist. Is on one side. States everything tastes salty.   Insomnia  Continues to have frequent awakenings at night. Pt is able to fall asleep but will only sleep for an hour or so in increments.   ROS As per HPI   Objective:  BP (!) 162/75   Pulse 72   Temp 98 F (36.7 C)   Ht 5\' 4"  (1.626 m)   Wt 159 lb (72.1 kg)   LMP  (LMP Unknown)   SpO2 98%   BMI 27.29 kg/m  Filed Weights   05/09/22 1026  Weight: 159 lb (72.1 kg)     Physical Exam Constitutional:      General: She is not in acute distress.    Appearance: Normal appearance. She is not ill-appearing, toxic-appearing or diaphoretic.  Cardiovascular:     Rate and Rhythm: Normal rate.     Pulses: Normal pulses.     Heart sounds: Murmur heard.     No gallop.  Pulmonary:     Effort: Pulmonary effort is normal. No respiratory distress.     Breath sounds: Normal breath sounds. No stridor. No wheezing, rhonchi or rales.  Skin:    General: Skin is warm.     Capillary Refill: Capillary refill takes less than 2 seconds.  Neurological:     General: No focal deficit present.     Mental Status: She is alert and oriented to person, place, and time. Mental status is at baseline.     Motor: No weakness.  Psychiatric:        Mood and Affect: Mood normal.        Behavior: Behavior normal.         Thought Content: Thought content normal.        Judgment: Judgment normal.    Assessment & Plan:  1. Type 2 diabetes mellitus with other circulatory complication, without long-term current use of insulin ([place]) Will restart medication as below until patient is able to see endocrinology. Instructed pt to start with one metformin daily for one week to mitigate GI side effects.  - metFORMIN (GLUCOPHAGE) 500 MG tablet; Take 1 tablet (500 mg total) by mouth 2 (two) times daily with a meal.  Dispense: 180 tablet; Refill: 3  2. Hypertension associated with type 2 diabetes mellitus (HCC) Elevated BP today in office. Discussed with patient monitoring BP at home. Pt has been monitoring BP at home with elevated readings. Given patient age and use of anticoagulation, would like cardiology recommendations on BP. Instructed patient to bring in her BP monitor for comparison.  Instructed pt to take BP first thing in the morning after sitting for 5 minutes with feet flat on the floor, arm at heart level. Will review measurements with  patient at follow up and determine plan for BP management.   3. Primary insomnia Pt continues to not sleep well at night. Pt is taking Trazodone nightly. Will discuss with patient starting antidepressant therapy for elevated PHQ/GAD screening.   The above assessment and management plan was discussed with the patient. The patient verbalized understanding of and has agreed to the management plan using shared-decision making. Patient is aware to call the clinic if they develop any new symptoms or if symptoms fail to improve or worsen. Patient is aware when to return to the clinic for a follow-up visit. Patient educated on when it is appropriate to go to the emergency department.   Donzetta Kohut, DNP-FNP [facility] [address] [place], [place] [address] [phone]

## 2022-05-16 ENCOUNTER — Telehealth: Payer: 59

## 2022-05-16 NOTE — Progress Notes (Unsigned)
S:     No chief complaint on file.  82 y.o. female who presents for diabetes evaluation, education, and management.  PMH is significant for ***.  Patient was referred and last seen by Primary Care Provider, Dr. ***, on ***.  *** Patient was referred by *** on ***. Patient was last seen by Primary Care Provider, Dr. ***, on ***.  At last visit, ***.   Today, patient arrives in *** good spirits and presents without *** any assistance. ***  Patient reports Diabetes was diagnosed in ***.   Family/Social History: ***  Current diabetes medications include: *** Current hypertension medications include: *** Current hyperlipidemia medications include: ***  Patient reports adherence to taking all medications as prescribed.  *** Patient denies adherence with medications, reports missing *** medications *** times per week, on average.  Do you feel that your medications are working for you? {YES NO:22349} Have you been experiencing any side effects to the medications prescribed? {YES NO:22349} Do you have any problems obtaining medications due to transportation or finances? {YES [id number] Insurance coverage: ***  Patient {Actions; denies-reports:120008} hypoglycemic events.  Reported home fasting blood sugars: ***  Reported 2 hour post-meal/random blood sugars: ***.  Patient {Actions; denies-reports:120008} nocturia (nighttime urination).  Patient {Actions; denies-reports:120008} neuropathy (nerve pain). Patient {Actions; denies-reports:120008} visual changes. Patient {Actions; denies-reports:120008} self foot exams.   Patient reported dietary habits: Eats *** meals/day Breakfast: *** Lunch: *** Dinner: *** Snacks: *** Drinks: ***  Within the past 12 months, did you worry whether your food would run out before you got money to buy more? {YES NO:22349} Within the past 12 months, did the food you bought run out, and you didn't have money to get more? {YES NO:22349} PHQ-9 Score:  ***  Patient-reported exercise habits: ***   O:   ROS  Physical Exam  7 day average blood glucose: ***  *** CGM Download:  % Time CGM is active: ***% Average Glucose: *** mg/dL Glucose Management Indicator: ***  Glucose Variability: *** (goal <36%) Time in Goal:  - Time in range 70-180: ***% - Time above range: ***% - Time below range: ***% Observed patterns:   Lab Results  Component Value Date   HGBA1C 8.2 (H) 04/20/2022   There were no vitals filed for this visit.  Lipid Panel     Component Value Date/Time   CHOL 109 01/24/2022 1026   TRIG 87 01/24/2022 1026   HDL 41 01/24/2022 1026   CHOLHDL 2.7 01/24/2022 1026   CHOLHDL 2.4 03/09/2020 0209   VLDL 12 03/09/2020 0209   LDLCALC 51 01/24/2022 1026    Clinical Atherosclerotic Cardiovascular Disease (ASCVD): {YES/NO:21197} The ASCVD Risk score (Arnett DK, et al., 2019) failed to calculate for the following reasons:   The 2019 ASCVD risk score is only valid for ages 50 to 41   The patient has a prior MI or stroke diagnosis   Patient is participating in a Managed Medicaid Plan:  {MM YES/NO:27447::"Yes"}   A/P: Diabetes longstanding *** currently ***. Patient is *** able to verbalize appropriate hypoglycemia management plan. Medication adherence appears ***. Control is suboptimal due to ***. -{Meds adjust:18428} basal insulin *** Lantus/Basaglar/Semglee (insulin glargine) *** Tresiba (insulin degludec) from *** units to *** units daily in the morning. Patient will continue to titrate 1 unit every *** days if fasting blood sugar > 100mg /dl until fasting blood sugars reach goal or next visit.  -{Meds adjust:18428} rapid insulin *** Novolog (insulin aspart) *** Humalog (insulin lispro) from *** to ***.  -{  Meds [id number] GLP-1 *** Trulicity (dulaglutide) *** Ozempic (semaglutide) *** Mounjaro (tirzepatide) from *** mg to *** mg .  -{Meds adjust:18428} SGLT2-I *** Farxiga (dapagliflozin) *** Jardiance (empagliflozin)  10 mg. Counseled on sick day rules. -{Meds adjust:18428} metformin ***.  -Patient educated on purpose, proper use, and potential adverse effects of ***.  -Extensively discussed pathophysiology of diabetes, recommended lifestyle interventions, dietary effects on blood sugar control.  -Counseled on s/sx of and management of hypoglycemia.  -Next A1c anticipated ***.   ASCVD risk - primary ***secondary prevention in patient with diabetes. Last LDL is *** not at goal of <70 *** mg/dL. ASCVD risk factors include *** and 10-year ASCVD risk score of ***. {Desc; low/moderate/high:110033} intensity statin indicated.  -{Meds adjust:18428} ***statin *** mg.   Hypertension longstanding *** currently ***. Blood pressure goal of <130/80 *** mmHg. Medication adherence ***. Blood pressure control is suboptimal due to ***. -{Meds adjust:18428} *** mg.  Written patient instructions provided. Patient verbalized understanding of treatment plan.  Total time in face to face counseling *** minutes.    Follow-up:  Pharmacist ***. PCP clinic visit in ***.  Patient seen with ***

## 2022-05-21 DIAGNOSIS — I4891 Unspecified atrial fibrillation: Secondary | ICD-10-CM

## 2022-05-21 DIAGNOSIS — E1159 Type 2 diabetes mellitus with other circulatory complications: Secondary | ICD-10-CM

## 2022-05-24 ENCOUNTER — Telehealth: Payer: Self-pay | Admitting: Family Medicine

## 2022-05-24 NOTE — Telephone Encounter (Signed)
Contacted patients daughter, [name], per signed DPR.  She is concerned about patients forgetfulness.  She is asking what the early symptoms of dementia are. She I says it is touchy subject with the patient as she is described  as fiercely independent. She is not sure how to approach the situation but at the same time feels that it needs to be addressed.

## 2022-06-06 ENCOUNTER — Ambulatory Visit (INDEPENDENT_AMBULATORY_CARE_PROVIDER_SITE_OTHER): Payer: 59 | Admitting: Family Medicine

## 2022-06-06 ENCOUNTER — Encounter: Payer: Self-pay | Admitting: Family Medicine

## 2022-06-06 VITALS — BP 129/73 | HR 79 | Temp 98.0°F | Ht 64.0 in | Wt 158.0 lb

## 2022-06-06 DIAGNOSIS — R6889 Other general symptoms and signs: Secondary | ICD-10-CM

## 2022-06-06 DIAGNOSIS — F5101 Primary insomnia: Secondary | ICD-10-CM

## 2022-06-06 DIAGNOSIS — E785 Hyperlipidemia, unspecified: Secondary | ICD-10-CM | POA: Diagnosis not present

## 2022-06-06 DIAGNOSIS — G8929 Other chronic pain: Secondary | ICD-10-CM

## 2022-06-06 DIAGNOSIS — F339 Major depressive disorder, recurrent, unspecified: Secondary | ICD-10-CM | POA: Diagnosis not present

## 2022-06-06 DIAGNOSIS — F418 Other specified anxiety disorders: Secondary | ICD-10-CM | POA: Diagnosis not present

## 2022-06-06 DIAGNOSIS — I10 Essential (primary) hypertension: Secondary | ICD-10-CM | POA: Diagnosis not present

## 2022-06-06 DIAGNOSIS — R413 Other amnesia: Secondary | ICD-10-CM

## 2022-06-06 DIAGNOSIS — M545 Low back pain, unspecified: Secondary | ICD-10-CM | POA: Diagnosis not present

## 2022-06-06 NOTE — Progress Notes (Signed)
Acute Office Visit  Subjective:  Patient ID: [name], female    DOB: [birth year], 82 y.o.   MRN: [id number]  Chief Complaint  Patient presents with   Medical Management of Chronic Issues   HPI Patient is in today for follow up on chronic conditions.  Mrs. Stair follows with Endocrinology and Cardiology  Has follow up in May with Cardiology and Endocrinology in June.  She is also established with [facility]   Hyperlipidemia  Continues on crestor. Denies any side effects  Last lipid panel normal  Managed by Cardiology   Insomnia  Continues to take trazodone  States that the trazodone is helping some.  Wakes up 2-3 per night. Usually is using the bathroom only once per night.  Has tried Unisom and melatonin in the past.   Depression and Anxiety  Patient states that it is "about the same" States that there is so much to do that she just cannot do it. Things bother her more easily.  She feels "on edge" easily.  The things bothering her are projects around her house.  Denies SI.  Does not wish to start anything for anxiety, depression at this time.   Pain  Takes norco twice daily seldomly due to constipation. Typically takes it once daily.  Pain is in her left hip and lower back CSA is signed and Toxassure was completed at last appt  04/24/22   Confusion/Forgetful  States that sometimes she forgets "simple things" like appointments. Daughter was concerned patient has been forgetful lately.   ROS As per HPI  Objective:  BP 129/73   Pulse 79   Temp 98 F (36.7 C)   Ht 5\' 4"  (1.626 m)   Wt 158 lb (71.7 kg)   LMP  (LMP Unknown)   SpO2 96%   BMI 27.12 kg/m   Physical Exam Constitutional:      General: She is not in acute distress.    Appearance: Normal appearance. She is overweight. She is not ill-appearing, toxic-appearing or diaphoretic.  Cardiovascular:     Rate and Rhythm: Normal rate. Rhythm irregular.     Pulses: Normal pulses.     Heart sounds:  Murmur heard.     No gallop.  Pulmonary:     Effort: Pulmonary effort is normal. No respiratory distress.     Breath sounds: Normal breath sounds. No stridor or decreased air movement. No decreased breath sounds, wheezing, rhonchi or rales.  Skin:    General: Skin is warm.     Capillary Refill: Capillary refill takes less than 2 seconds.  Neurological:     General: No focal deficit present.     Mental Status: She is alert and oriented to person, place, and time. Mental status is at baseline.     Motor: No weakness.  Psychiatric:        Mood and Affect: Mood normal.        Behavior: Behavior normal.        Thought Content: Thought content normal.        Judgment: Judgment normal.       06/06/2022   11:00 AM 05/09/2022   10:33 AM 05/02/2022   11:39 AM  Depression screen PHQ 2/9  Decreased Interest 2 1 0  Down, Depressed, Hopeless 2 1 1   PHQ - 2 Score 4 2 1   Altered sleeping 2 2   Tired, decreased energy 2 2   Change in appetite 2 0   Feeling bad or failure  about yourself  1 2   Trouble concentrating 2 2   Moving slowly or fidgety/restless 0 0   Suicidal thoughts 0 0   PHQ-9 Score 13 10   Difficult doing work/chores Somewhat difficult Somewhat difficult       06/06/2022   11:00 AM 05/09/2022   10:34 AM 04/20/2022   10:50 AM 01/24/2022    9:42 AM  GAD 7 : Generalized Anxiety Score  Nervous, Anxious, on Edge 0  Control/stop worrying 0  Worry too much - different things 0  Trouble relaxing 1 2 0 0  Restless 0 1 0 1  Easily annoyed or irritable Afraid - awful might happen 0 1 0 0  Total GAD 7 Score Anxiety Difficulty Somewhat difficult Somewhat difficult  Not difficult at all   Assessment & Plan:  1. Chronic midline low back pain without sciatica PDMP reviewed. No red flags. Last Toxassure as expected as patient was out of medication for one month prior to submitting toxassure. Will complete Toxassure at next visit.   2.  Forgetfulness MMSE completed today. Reviewed results with patient and daughter. Discussed concerns for safety with patient and daughter.   3. Depression, recurrent 4. Anxiety with depression Pt screened positive for depression and anxiety today. Pt offered nonpharmacologic and pharmacologic therapy. Pt declined initiating treatment at this time. Safety contract established today with patient in clinic. Denies intent to harm herself or others. Pt to notify provider if they would like to initiate treatment.   5. Dyslipidemia Labs as below. Will communicate results to patient once available.  Will complete labs at appointment with endocrinology.  - Lipid panel; Future  6. Primary hypertension BP well controlled during visit today. Patient provided home BP's from the weekend. Managed by Cardiology.   7. Primary insomnia Moderately controlled. Does not wish to change regimen at this time.   The above assessment and management plan was discussed with the patient. The patient verbalized understanding of and has agreed to the management plan using shared-decision making. Patient is aware to call the clinic if they develop any new symptoms or if symptoms fail to improve or worsen. Patient is aware when to return to the clinic for a follow-up visit. Patient educated on when it is appropriate to go to the emergency department.    Neale Burly, DNP-FNP [facility] [address] [place], [place] [address] [phone]

## 2022-06-12 ENCOUNTER — Ambulatory Visit (INDEPENDENT_AMBULATORY_CARE_PROVIDER_SITE_OTHER): Payer: 59

## 2022-06-12 DIAGNOSIS — I4819 Other persistent atrial fibrillation: Secondary | ICD-10-CM

## 2022-06-13 LAB — CUP PACEART REMOTE DEVICE CHECK
Date Time Interrogation Session: 20240419230405
Implantable Pulse Generator Implant Date: 20220210

## 2022-06-14 NOTE — Telephone Encounter (Signed)
Patient was seen 04/16

## 2022-06-20 ENCOUNTER — Encounter: Payer: Self-pay | Admitting: Family Medicine

## 2022-06-20 ENCOUNTER — Telehealth (INDEPENDENT_AMBULATORY_CARE_PROVIDER_SITE_OTHER): Payer: 59 | Admitting: Family Medicine

## 2022-06-20 DIAGNOSIS — N3946 Mixed incontinence: Secondary | ICD-10-CM

## 2022-06-20 DIAGNOSIS — R438 Other disturbances of smell and taste: Secondary | ICD-10-CM | POA: Diagnosis not present

## 2022-06-20 NOTE — Progress Notes (Signed)
Virtual Visit via Video Note  I connected with [name] on 06/20/22 at 10:20 AM EDT by a video enabled telemedicine application and verified that I am speaking with the correct person using two identifiers.  Patient Location: Home with daughter  Provider Location: Office/Clinic  I discussed the limitations, risks, security, and privacy concerns of performing an evaluation and management service by video and the availability of in person appointments. I also discussed with the patient that there may be a patient responsible charge related to this service. The patient expressed understanding and agreed to proceed.  Subjective: PCP: Arrie Senate, FNP  Chief Complaint  Patient presents with   Urinary Frequency   Incontinence  States that this problem started several years ago. Before she went through menopause. Reports that she began wearing panty liners "as protection" for frequent leaks. Often she is not able to get to the bathroom in time after feeling the urge to urinate. She has increased frequency. Reports that this is not all the time but often. Denies confusion or fever. Denies changes to color of urine, denies burning. States that her lower back hurts all the time from disc disease.   Metallic Taste Other concern today is continued metal taste in her mouth. This problem started several years ago. Reports it started prior to covid. She has been to Neurology and had reassuring evaluations and imaging. She has been evaluated and followed by dentist, changed toothpaste. She is concerned because she does not want to eat due to the symptoms and she is losing weight. She has not been able to find the source. Trialed gabapentin, which did not work. Has not had chemo/radiation to the area. Has completed a trial off of metformin, which did not resolve symptoms. Denies dipping/chewing tobacco. Had reassuring MMSE at last visit.   ROS: Per HPI  Current Outpatient Medications:     acetaminophen (TYLENOL) 500 MG tablet, Take 1,000 mg by mouth every 6 (six) hours as needed for moderate pain., Disp: , Rfl:    Ascorbic Acid (VITAMIN C) 500 MG tablet, Take 500 mg by mouth daily.  , Disp: , Rfl:    bimatoprost (LUMIGAN) 0.01 % SOLN, Place 1 drop into both eyes at bedtime., Disp: , Rfl:    Calcium Carbonate Antacid (TUMS PO), Take 2 tablets by mouth 4 (four) times daily as needed (for acid reflux/indigestion). , Disp: , Rfl:    Camphor-Menthol-Methyl Sal (SALONPAS) 3.02-26-08 % PTCH, Apply 1 patch topically daily as needed (pain)., Disp: , Rfl:    cetirizine (ZYRTEC) 10 MG tablet, Take 10 mg by mouth daily as needed for allergies., Disp: , Rfl:    Cyanocobalamin (VITAMIN B 12 PO), Take 1 tablet by mouth daily., Disp: , Rfl:    diclofenac Sodium (VOLTAREN) 1 % GEL, Apply 2 g topically 4 (four) times daily. (Patient taking differently: Apply 2 g topically 4 (four) times daily as needed (pain).), Disp: 50 g, Rfl: 2   diltiazem (CARDIZEM CD) 240 MG 24 hr capsule, TAKE ONE CAPSULE BY MOUTH DAILY, Disp: 90 capsule, Rfl: 1   ELIQUIS 5 MG TABS tablet, TAKE ONE TABLET BY MOUTH TWICE DAILY, Disp: 60 tablet, Rfl: 5   fluticasone (FLONASE) 50 MCG/ACT nasal spray, Place 2 sprays into both nostrils daily., Disp: 16 g, Rfl: 6   GARLIC PO, Take 1 tablet by mouth daily., Disp: , Rfl:    HYDROcodone-acetaminophen (NORCO) 5-325 MG tablet, Take 1 tablet by mouth every 6 (six) hours as needed for  moderate pain., Disp: 30 tablet, Rfl: 0   HYDROcodone-acetaminophen (NORCO) 5-325 MG tablet, Take 1 tablet by mouth every 6 (six) hours as needed for moderate pain., Disp: 30 tablet, Rfl: 0   HYDROcodone-acetaminophen (NORCO) 5-325 MG tablet, Take 1 tablet by mouth every 6 (six) hours as needed for moderate pain., Disp: 30 tablet, Rfl: 0   levothyroxine (SYNTHROID) 50 MCG tablet, Take 1 tablet (50 mcg total) by mouth daily before breakfast., Disp: 90 tablet, Rfl: 3   losartan (COZAAR) 50 MG tablet, TAKE ONE (1)  TABLET BY MOUTH EVERY DAY, Disp: 90 tablet, Rfl: 3   MAGNESIUM CARBONATE PO, Take 1 tablet by mouth at bedtime., Disp: , Rfl:    metFORMIN (GLUCOPHAGE) 500 MG tablet, Take 1 tablet (500 mg total) by mouth 2 (two) times daily with a meal., Disp: 180 tablet, Rfl: 3   metoprolol succinate (TOPROL-XL) 100 MG 24 hr tablet, TAKE 1 TABLET BY MOUTH DAILY WITH FOOD, Disp: 30 tablet, Rfl: 11   nitroGLYCERIN (NITROSTAT) 0.4 MG SL tablet, Place 0.4 mg under the tongue every 5 (five) minutes as needed for chest pain., Disp: , Rfl:    rosuvastatin (CRESTOR) 20 MG tablet, TAKE ONE (1) TABLET BY MOUTH EVERY DAY, Disp: 90 tablet, Rfl: 1   timolol (TIMOPTIC) 0.5 % ophthalmic solution, Place 1 drop into both eyes daily., Disp: , Rfl:    traZODone (DESYREL) 150 MG tablet, Take 1 tablet (150 mg total) by mouth at bedtime., Disp: 90 tablet, Rfl: 1   TURMERIC PO, Take 1 tablet by mouth daily., Disp: , Rfl:    VENTOLIN HFA 108 (90 Base) MCG/ACT inhaler, Inhale 1-2 puffs into the lungs every 4 (four) hours as needed for shortness of breath., Disp: 18 g, Rfl: 1   Vitamin D, Cholecalciferol, 10 MCG (400 UNIT) CAPS, Take 400 Units by mouth daily., Disp: , Rfl:   Observations/Objective: There were no vitals filed for this visit. 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 Will offer to patient pelvic floor therapy. Patient had reassuring MMSE at last appointment. Discussed with patient and daughter monitoring for signs of confusion with UTI in elderly adults. Placed order as below for patient to drop off urine specimen in the future to ensure there is not an infection.  - Urine Culture; Future - Urinalysis, Routine w reflex microscopic; Future  2. Metallic  taste Referral as below. Patient has tried many therapies such as gabapentin and trial off of metformin. She is not deficient in Vitamin B12. She does not have a history of radiation to the site, trauma, or irritation through substance use such as tobacco.  Can consider reducing her eliquis dose to see if that is the cause, however she is not anemic on her last CBC. Will refer to ENT as below for any additional workup that they can deem appropriate.  - Ambulatory referral to ENT  The above assessment and management plan was discussed with the patient. The patient verbalized understanding of and has agreed to the management plan using shared-decision making. Patient is aware to call the clinic if they develop any new symptoms or if symptoms fail to improve or worsen. Patient is aware when to return to the clinic for a follow-up visit. Patient educated  on when it is appropriate to go to the emergency department.   Follow Up Instructions: Follow up as needed for chronic conditions   I discussed the assessment and treatment plan with the patient. The patient was provided an opportunity to ask questions, and all were answered. The patient agreed with the plan and demonstrated an understanding of the instructions.   The patient was advised to call back or seek an in-person evaluation if the symptoms worsen or if the condition fails to improve as anticipated.  The above assessment and management plan was discussed with the patient. The patient verbalized understanding of and has agreed to the management plan.   Neale Burly, DNP-FNP [facility] [address] [place], [place] [address] [phone]

## 2022-06-20 NOTE — Progress Notes (Signed)
Carelink Summary Report / Loop Recorder 

## 2022-07-03 ENCOUNTER — Other Ambulatory Visit: Payer: Self-pay | Admitting: Cardiology

## 2022-07-05 ENCOUNTER — Encounter: Payer: Self-pay | Admitting: Cardiology

## 2022-07-05 ENCOUNTER — Ambulatory Visit: Payer: 59 | Attending: Cardiology | Admitting: Cardiology

## 2022-07-05 VITALS — BP 164/72 | HR 72 | Ht 64.0 in | Wt 154.8 lb

## 2022-07-05 DIAGNOSIS — I4819 Other persistent atrial fibrillation: Secondary | ICD-10-CM

## 2022-07-05 DIAGNOSIS — I25119 Atherosclerotic heart disease of native coronary artery with unspecified angina pectoris: Secondary | ICD-10-CM | POA: Diagnosis not present

## 2022-07-05 DIAGNOSIS — E782 Mixed hyperlipidemia: Secondary | ICD-10-CM

## 2022-07-05 MED ORDER — DILTIAZEM HCL ER COATED BEADS 300 MG PO CP24: 300.0000 mg | ORAL_CAPSULE | Freq: Every day | ORAL | 1 refills | Status: DC

## 2022-07-05 NOTE — Progress Notes (Signed)
Cardiology Office Note  Date: 07/05/2022   ID: [name], DOB [birth year], MRN [id number]  History of Present Illness: [name] is an 82 y.o. female last seen in November 2023.  She is here today with family member for a follow-up visit.  Reports no definite angina, but has had more of a sense of palpitations over the last month.  She is in sinus rhythm with PACs by ECG today.  She has an ILR in place, likely to be interrogated in the next week per chart review.  Her last interrogation showed no new atrial fibrillation events in April.  She continues to follow with Dr. Nelly Matthews, I reviewed his note from December 2023.  There was discussion at that time about possible initiation of Tikosyn depending on rhythm control.  We went over her current medications, I discussed increasing her Cardizem CD to 300 mg daily for now otherwise no changes.  She reports no spontaneous bleeding problems on Eliquis.  Physical Exam: VS:  BP (!) 164/72   Pulse 72   Ht 5\' 4"  (1.626 m)   Wt 154 lb 12.8 oz (70.2 kg)   LMP  (LMP Unknown)   SpO2 97%   BMI 26.57 kg/m , BMI Body mass index is 26.57 kg/m.  Wt Readings from Last 3 Encounters:  07/05/22 154 lb 12.8 oz (70.2 kg)  06/06/22 158 lb (71.7 kg)  05/09/22 159 lb (72.1 kg)    General: Patient appears comfortable at rest. HEENT: Conjunctiva and lids normal. Neck: Supple, no elevated JVP or carotid bruits. Lungs: Clear to auscultation, nonlabored breathing at rest. Cardiac: Regular rate and rhythm with ectopy, no S3, 1/6 systolic murmur. Extremities: No pitting edema.  ECG:  An ECG dated October 2023 was personally reviewed today and demonstrated:  Sinus rhythm with frequent PACs.  Labwork: 07/26/2021: TSH 0.529 01/24/2022: ALT 16; AST 21; BUN 10; Creatinine, Ser 0.82; Hemoglobin 13.0; Platelets 190; Potassium 4.3; Sodium 141     Component Value Date/Time   CHOL 109 01/24/2022 1026   TRIG 87 01/24/2022 1026   HDL 41 01/24/2022 1026    CHOLHDL 2.7 01/24/2022 1026   CHOLHDL 2.4 03/09/2020 0209   VLDL 12 03/09/2020 0209   LDLCALC 51 01/24/2022 1026   Other Studies Reviewed Today:  Echocardiogram 01/11/2022:  1. Left ventricular ejection fraction, by estimation, is 55 to 60%. The  left ventricle has normal function. The left ventricle has no regional  wall motion abnormalities. Left ventricular diastolic parameters are  consistent with Grade II diastolic  dysfunction (pseudonormalization). The average left ventricular global  longitudinal strain is -18.0 %.   2. Right ventricular systolic function is mildly reduced. The right  ventricular size is normal.   3. The mitral valve is abnormal Severe mitral annular calcification.      Moderate mitral valve regurgitation. There appears to be at least mild  mitral valve stenosis based on visual estimation.   4. The aortic valve is abnormal. There is moderate calcification of the  aortic valve with moderate restriction of leaflet mobility. Aortic valve  regurgitation is mild to moderate. Mild aortic valve stenosis.   5. The inferior vena cava is normal in size with greater than 50%  respiratory variability, suggesting right atrial pressure of 3 mmHg.   Assessment and Plan:  1.  CAD status post DES to the LAD in 2010, DES to the LAD in 2017, and DES to the ramus intermedius as well as DES x 2 to the mid  RCA in 2022.  LVEF 55 to 60% by echocardiogram in November 2023.  She reports no active angina at this time.  Not on aspirin given use of Eliquis.  Continue Crestor and as needed nitroglycerin.  2.  Persistent atrial fibrillation with CHA2DS2-VASc score of 6 status post atrial fibrillation ablation by Dr. Johney Matthews in December 2022.  She had a visit with Dr. Nelly Matthews in December 2023.  Previously taken off of amiodarone due to hypothyroidism.  Possibility of Tikosyn discussed and deferred at that point.  Last ILR interrogation indicated no new atrial fibrillation events in April.  She  will have ILR checked this month, does subjectively feel more palpitations.  She is in sinus rhythm with PACs by ECG today.  Increase Cardizem CD to 300 mg daily, otherwise continue Toprol-XL and Eliquis.  I discussed rationale for considering Tikosyn with her again today if rhythm frequency increases.  3.  Essential hypertension.  4.  Mixed hyperlipidemia on Crestor 20 mg daily.  LDL 51 in December 2023.  Disposition:  Follow up  6 months.  Signed, [name], M.D., F.A.C.C. [facility]

## 2022-07-05 NOTE — Patient Instructions (Addendum)
Medication Instructions:  Your physician has recommended you make the following change in your medication:  Increase diltiazem to 300 mg daily Continue other medications the same  Labwork: none  Testing/Procedures: none  Follow-Up: Your physician recommends that you schedule a follow-up appointment in: 6 months  Any Other Special Instructions Will Be Listed Below (If Applicable).  If you need a refill on your cardiac medications before your next appointment, please call your pharmacy. 

## 2022-07-11 ENCOUNTER — Ambulatory Visit (INDEPENDENT_AMBULATORY_CARE_PROVIDER_SITE_OTHER): Payer: 59 | Admitting: *Deleted

## 2022-07-11 DIAGNOSIS — E1159 Type 2 diabetes mellitus with other circulatory complications: Secondary | ICD-10-CM

## 2022-07-11 DIAGNOSIS — I4891 Unspecified atrial fibrillation: Secondary | ICD-10-CM

## 2022-07-11 NOTE — Chronic Care Management (AMB) (Signed)
Chronic Care Management   CCM RN Visit Note  07/11/2022 Name: [name] MRN: [id number] DOB: 25-Mar-1940  Subjective: [name] is a 82 y.o. year old female who is a primary care patient of Ellamae Sia, Aleen Campi, FNP. The patient was referred to the Chronic Care Management team for assistance with care management needs subsequent to provider initiation of CCM services and plan of care.    Today's Visit:  Engaged with patient by telephone for follow up visit.        Goals Addressed             This Visit's Progress    CCM (ATRIAL FIBRILLATION) EXPECTED OUTCOME: MONITOR, SELF-MANAGE AND REDUCE SYMPTOMS OF ATRIAL FIBRILLATION       Current Barriers:  Knowledge Deficits related to Atrial fibrillation management Chronic Disease Management support and education needs related to Atrial fibrillation Patient reports she has all medications and taking as prescribed including blood thinner Patient reports she has a cane and did not discuss obtaining a walker, will reconsider in the future  Planned Interventions: Counseled on increased risk of stroke due to Afib and benefits of anticoagulation for stroke prevention           Reviewed importance of adherence to anticoagulant exactly as prescribed Advised patient to discuss issues with heart rate, exacerbation of A-fib with provider Counseled on importance of regular laboratory monitoring as prescribed Afib action plan reviewed Screening for signs and symptoms of depression related to chronic disease state Assessed social determinant of health barriers Reviewed upcoming scheduled appointments Atrial fibrillation action plan reviewed  Symptom Management: Take medications as prescribed   Attend all scheduled provider appointments Call pharmacy for medication refills 3-7 days in advance of running out of medications Attend church or other social activities Perform all self care activities independently  Call provider office for  new concerns or questions  - check pulse (heart) rate before taking medicine - make a plan to exercise regularly - make a plan to eat healthy - keep all lab appointments - take medicine as prescribed - wear medical alert identification Follow Atrial fibrillation action plan- call your doctor as needed for symptom management  Follow Up Plan: Telephone follow up appointment with care management team member scheduled for:  10/06/22 at 3 pm       CCM (DIABETES) EXPECTED OUTCOME: MONITOR, SELF- MANAGE AND REDUCE SYMPTOMS OF DIABETES       Current Barriers:  Knowledge Deficits related to Diabetes management Care Coordination needs related to medication management in a patient with Diabetes Chronic Disease Management support and education needs related to Diabetes, diet Patient reports she lives alone, has lots of support from her adult daughter and family, pt has never driven and daughter transports pt to all appointments and running errands, etc. Patient reports she used to check CBG years ago and not interested in checking now, does not like needles, states she was taken off metformin and no longer on any medication for diabetes, AIC increased to 8.2 up from 6.4 and 7.4 over past 2 years Patient reports her granddaughter is willing to check CBG and pt sees her frequently, pt states she has not started checking CBG, still not interested in at present  Planned Interventions: Reviewed medications with patient and discussed importance of medication adherence;        Counseled on importance of regular laboratory monitoring as prescribed;        Discussed plans with patient for ongoing care management follow up  and provided patient with direct contact information for care management team;      Advised patient, providing education and rationale, to check cbg per doctor's order  and record        call provider for findings outside established parameters;       Review of patient status, including review  of consultants reports, relevant laboratory and other test results, and medications completed;       Reinforced with pt to have her granddaughter check CBG and record Reinforced carbohydrate modified diet  Symptom Management: Take medications as prescribed   Attend all scheduled provider appointments Call pharmacy for medication refills 3-7 days in advance of running out of medications Attend church or other social activities Perform all self care activities independently  Call provider office for new concerns or questions  check blood sugar at prescribed times: per doctor's order  check feet daily for cuts, sores or redness enter blood sugar readings and medication or insulin into daily log take the blood sugar log to all doctor visits take the blood sugar meter to all doctor visits trim toenails straight across eat fish at least once per week fill half of plate with vegetables limit fast food meals to no more than 1 per week manage portion size prepare main meal at home 3 to 5 days each week read food labels for fat, fiber, carbohydrates and portion size set a realistic goal wash and dry feet carefully every day wear comfortable, cotton socks wear comfortable, well-fitting shoes Consider having your granddaughter check your blood sugar  Follow Up Plan: Telephone follow up appointment with care management team member scheduled for:   10/06/22 at 3 pm          Plan:Telephone follow up appointment with care management team member scheduled for:  10/06/22 at 3 pm  Irving Shows [facility], BSN RN Case Manager [facility] [phone]

## 2022-07-11 NOTE — Patient Instructions (Signed)
Please call the care guide team at [phone] if you need to cancel or reschedule your appointment.   If you are experiencing a Mental Health or Behavioral Health Crisis or need someone to talk to, please call the Suicide and Crisis Lifeline: 988 call the [place] National Suicide Prevention Lifeline: 915 657 1818 or TTY: 814-304-0916 TTY 9284982500) to talk to a trained counselor call 1-800-273-TALK (toll free, 24 hour hotline) go to [facility] [address] [place] [phone]) call the [facility]: [phone] call 911   Following is a copy of the CCM Program Consent:  CCM service includes personalized support from designated clinical staff supervised by the physician, including individualized plan of care and coordination with other care providers 24/7 contact phone numbers for assistance for urgent and routine care needs. Service will only be billed when office clinical staff spend 20 minutes or more in a month to coordinate care. Only one practitioner may furnish and bill the service in a calendar month. The patient may stop CCM services at amy time (effective at the end of the month) by phone call to the office staff. The patient will be responsible for cost sharing (co-pay) or up to 20% of the service fee (after annual deductible is met)  Following is a copy of your full provider care plan:   Goals Addressed             This Visit's Progress    CCM (ATRIAL FIBRILLATION) EXPECTED OUTCOME: MONITOR, SELF-MANAGE AND REDUCE SYMPTOMS OF ATRIAL FIBRILLATION       Current Barriers:  Knowledge Deficits related to Atrial fibrillation management Chronic Disease Management support and education needs related to Atrial fibrillation Patient reports she has all medications and taking as prescribed including blood thinner Patient reports she has a cane and did not discuss obtaining a walker, will reconsider in the  future  Planned Interventions: Counseled on increased risk of stroke due to Afib and benefits of anticoagulation for stroke prevention           Reviewed importance of adherence to anticoagulant exactly as prescribed Advised patient to discuss issues with heart rate, exacerbation of A-fib with provider Counseled on importance of regular laboratory monitoring as prescribed Afib action plan reviewed Screening for signs and symptoms of depression related to chronic disease state Assessed social determinant of health barriers Reviewed upcoming scheduled appointments Atrial fibrillation action plan reviewed  Symptom Management: Take medications as prescribed   Attend all scheduled provider appointments Call pharmacy for medication refills 3-7 days in advance of running out of medications Attend church or other social activities Perform all self care activities independently  Call provider office for new concerns or questions  - check pulse (heart) rate before taking medicine - make a plan to exercise regularly - make a plan to eat healthy - keep all lab appointments - take medicine as prescribed - wear medical alert identification Follow Atrial fibrillation action plan- call your doctor as needed for symptom management  Follow Up Plan: Telephone follow up appointment with care management team member scheduled for:  10/06/22 at 3 pm       CCM (DIABETES) EXPECTED OUTCOME: MONITOR, SELF- MANAGE AND REDUCE SYMPTOMS OF DIABETES       Current Barriers:  Knowledge Deficits related to Diabetes management Care Coordination needs related to medication management in a patient with Diabetes Chronic Disease Management support and education needs related to Diabetes, diet Patient reports she lives alone, has lots of support from  her adult daughter and family, pt has never driven and daughter transports pt to all appointments and running errands, etc. Patient reports she used to check CBG years ago  and not interested in checking now, does not like needles, states she was taken off metformin and no longer on any medication for diabetes, AIC increased to 8.2 up from 6.4 and 7.4 over past 2 years Patient reports her granddaughter is willing to check CBG and pt sees her frequently, pt states she has not started checking CBG, still not interested in at present  Planned Interventions: Reviewed medications with patient and discussed importance of medication adherence;        Counseled on importance of regular laboratory monitoring as prescribed;        Discussed plans with patient for ongoing care management follow up and provided patient with direct contact information for care management team;      Advised patient, providing education and rationale, to check cbg per doctor's order  and record        call provider for findings outside established parameters;       Review of patient status, including review of consultants reports, relevant laboratory and other test results, and medications completed;       Reinforced with pt to have her granddaughter check CBG and record Reinforced carbohydrate modified diet  Symptom Management: Take medications as prescribed   Attend all scheduled provider appointments Call pharmacy for medication refills 3-7 days in advance of running out of medications Attend church or other social activities Perform all self care activities independently  Call provider office for new concerns or questions  check blood sugar at prescribed times: per doctor's order  check feet daily for cuts, sores or redness enter blood sugar readings and medication or insulin into daily log take the blood sugar log to all doctor visits take the blood sugar meter to all doctor visits trim toenails straight across eat fish at least once per week fill half of plate with vegetables limit fast food meals to no more than 1 per week manage portion size prepare main meal at home 3 to 5 days  each week read food labels for fat, fiber, carbohydrates and portion size set a realistic goal wash and dry feet carefully every day wear comfortable, cotton socks wear comfortable, well-fitting shoes Consider having your granddaughter check your blood sugar  Follow Up Plan: Telephone follow up appointment with care management team member scheduled for:   10/06/22 at 3 pm          Patient verbalizes understanding of instructions and care plan provided today and agrees to view in MyChart. Active MyChart status and patient understanding of how to access instructions and care plan via MyChart confirmed with patient.  Telephone follow up appointment with care management team member scheduled for:   10/06/22 at 3 pm

## 2022-07-12 ENCOUNTER — Ambulatory Visit (INDEPENDENT_AMBULATORY_CARE_PROVIDER_SITE_OTHER): Payer: 59

## 2022-07-12 DIAGNOSIS — I4819 Other persistent atrial fibrillation: Secondary | ICD-10-CM | POA: Diagnosis not present

## 2022-07-13 LAB — CUP PACEART REMOTE DEVICE CHECK
Date Time Interrogation Session: 20240522230105
Implantable Pulse Generator Implant Date: 20220210

## 2022-07-19 NOTE — Progress Notes (Signed)
Carelink Summary Report / Loop Recorder 

## 2022-07-21 DIAGNOSIS — E1159 Type 2 diabetes mellitus with other circulatory complications: Secondary | ICD-10-CM | POA: Diagnosis not present

## 2022-07-21 DIAGNOSIS — I4891 Unspecified atrial fibrillation: Secondary | ICD-10-CM

## 2022-07-21 DIAGNOSIS — Z7984 Long term (current) use of oral hypoglycemic drugs: Secondary | ICD-10-CM

## 2022-07-24 DIAGNOSIS — K146 Glossodynia: Secondary | ICD-10-CM | POA: Diagnosis not present

## 2022-08-03 NOTE — Progress Notes (Signed)
Carelink Summary Report / Loop Recorder 

## 2022-08-08 ENCOUNTER — Other Ambulatory Visit: Payer: Self-pay | Admitting: "Endocrinology

## 2022-08-08 DIAGNOSIS — E119 Type 2 diabetes mellitus without complications: Secondary | ICD-10-CM

## 2022-08-08 DIAGNOSIS — E032 Hypothyroidism due to medicaments and other exogenous substances: Secondary | ICD-10-CM

## 2022-08-08 DIAGNOSIS — E1169 Type 2 diabetes mellitus with other specified complication: Secondary | ICD-10-CM

## 2022-08-08 DIAGNOSIS — E042 Nontoxic multinodular goiter: Secondary | ICD-10-CM

## 2022-08-10 ENCOUNTER — Other Ambulatory Visit (HOSPITAL_COMMUNITY)
Admission: RE | Admit: 2022-08-10 | Discharge: 2022-08-10 | Disposition: A | Discharge status: Home / Self Care | Payer: 59 | Source: Ambulatory Visit | Attending: Nurse Practitioner | Admitting: Nurse Practitioner

## 2022-08-10 DIAGNOSIS — E032 Hypothyroidism due to medicaments and other exogenous substances: Secondary | ICD-10-CM | POA: Insufficient documentation

## 2022-08-10 LAB — T4, FREE: Free T4: 1.18 ng/dL — ABNORMAL HIGH (ref 0.61–1.12)

## 2022-08-10 LAB — TSH: TSH: 0.462 u[IU]/mL (ref 0.350–4.500)

## 2022-08-14 ENCOUNTER — Ambulatory Visit (INDEPENDENT_AMBULATORY_CARE_PROVIDER_SITE_OTHER): Payer: 59

## 2022-08-14 DIAGNOSIS — I4819 Other persistent atrial fibrillation: Secondary | ICD-10-CM | POA: Diagnosis not present

## 2022-08-15 ENCOUNTER — Encounter: Payer: Self-pay | Admitting: Nurse Practitioner

## 2022-08-15 ENCOUNTER — Ambulatory Visit (INDEPENDENT_AMBULATORY_CARE_PROVIDER_SITE_OTHER): Payer: 59 | Admitting: Nurse Practitioner

## 2022-08-15 VITALS — BP 144/80 | HR 66 | Ht 64.0 in | Wt 157.2 lb

## 2022-08-15 DIAGNOSIS — E042 Nontoxic multinodular goiter: Secondary | ICD-10-CM

## 2022-08-15 DIAGNOSIS — E032 Hypothyroidism due to medicaments and other exogenous substances: Secondary | ICD-10-CM | POA: Diagnosis not present

## 2022-08-15 LAB — CUP PACEART REMOTE DEVICE CHECK
Date Time Interrogation Session: 20240624230140
Implantable Pulse Generator Implant Date: 20220210

## 2022-08-15 MED ORDER — LEVOTHYROXINE SODIUM 50 MCG PO TABS: 50.0000 ug | ORAL_TABLET | Freq: Every day | ORAL | 3 refills | Status: DC

## 2022-08-15 NOTE — Patient Instructions (Signed)

## 2022-08-15 NOTE — Progress Notes (Signed)
08/15/2022, 10:57 AM                          Endocrinology Follow Up Visit   Subjective:    Patient ID: [name], female    DOB: 05-14-1940, PCP Arrie Senate, FNP   Past Medical History:  Diagnosis Date   Anxiety    Arthritis    Bulging lumbar disc    Chronic lower back pain    Coronary atherosclerosis of native coronary artery    DES LAD 02/2008, DES LAD 02/2015, DES ramus and DES x2 mid RCA 02/2020   Depression    Dyslipidemia    Essential hypertension    Facial numbness    Frequent headaches    GERD (gastroesophageal reflux disease)    Glaucoma    Heart attack (HCC) 02/2020   Insomnia    Paroxysmal atrial fibrillation (HCC)    Diagnosed October 2019   PSVT (paroxysmal supraventricular tachycardia)    Refusal of blood transfusions as patient is Jehovah's Witness    Thyroid disease    Type 2 diabetes mellitus (HCC)    Borderline   Vitamin D deficiency    Past Surgical History:  Procedure Laterality Date   ATRIAL FIBRILLATION ABLATION N/A 02/16/2021   Procedure: ATRIAL FIBRILLATION ABLATION;  Surgeon: Hillis Range, MD;  Location: [facility] INVASIVE CV LAB;  Service: Cardiovascular;  Laterality: N/A;   BIOPSY  09/21/2021   Procedure: BIOPSY;  Surgeon: Corbin Ade, MD;  Location: [facility]O [facility];  Service: Endoscopy;;  gastric   CARDIAC CATHETERIZATION N/A 02/23/2015   Procedure: Left Heart Cath and Coronary Angiography;  Surgeon: Tonny Bollman, MD;  Location: [facility] INVASIVE CV LAB;  Service: Cardiovascular;  Laterality: N/A;   CARDIAC CATHETERIZATION N/A 02/23/2015   Procedure: Coronary Stent Intervention;  Surgeon: Tonny Bollman, MD;  Location: [facility] INVASIVE CV LAB;  Service: Cardiovascular;  Laterality: N/A;   CARDIAC CATHETERIZATION N/A 01/04/2016   Procedure: Left Heart Cath and Coronary Angiography;  Surgeon: Peter M Swaziland, MD;  Location: [facility] INVASIVE CV LAB;  Service: Cardiovascular;  Laterality: N/A;    CATARACT EXTRACTION     CORONARY ANGIOPLASTY  02/23/2015   CORONARY ANGIOPLASTY WITH STENT PLACEMENT  2009   CORONARY STENT INTERVENTION N/A 03/09/2020   Procedure: CORONARY STENT INTERVENTION;  Surgeon: Corky Crafts, MD;  Location: [facility] INVASIVE CV LAB;  Service: Cardiovascular;  Laterality: N/A;   DILATION AND CURETTAGE OF UTERUS     ESOPHAGOGASTRODUODENOSCOPY (EGD) WITH PROPOFOL N/A 09/21/2021   normal esophagus, abnormal gastric mucosa s/p biopsy, normal duodenum. Pathology with H.pylori. Prescribed Prevpac.   implantable loop recorder placement  04/01/2020   Medtronic Reveal [place] model LNQ 22 [id number]) implantable loop recorder    LEFT HEART CATH AND CORONARY ANGIOGRAPHY N/A 03/09/2020   Procedure: LEFT HEART CATH AND CORONARY ANGIOGRAPHY;  Surgeon: Corky Crafts, MD;  Location: [facility] INVASIVE CV LAB;  Service: Cardiovascular;  Laterality: N/A;   TUBAL LIGATION     Social History   Socioeconomic History   Marital status: Widowed    Spouse name: Not on file   Number of children: 8   Years of education: 10th grade   Highest education level: 10th grade  Occupational History   Occupation: RETIRED  Tobacco Use   Smoking status: Never   Smokeless tobacco: Never   Tobacco comments:    Never smoke 11/15/21  Vaping Use   Vaping Use: Never used  Substance and Sexual Activity   Alcohol use: Not Currently    Alcohol/week: 1.0 standard drink of alcohol    Types: 1 Glasses of wine per week    Comment: RARE OCCASION   Drug use: No   Sexual activity: Not Currently  Other Topics Concern   Not on file  Social History Narrative   Lives at home alone.   Right-handed.   No caffeine use.   Daughter lives next door and helps her out   Social Determinants of Health   Financial Resource Strain: Low Risk  (05/02/2022)   Overall Financial Resource Strain (CARDIA)    Difficulty of Paying Living Expenses: Not hard at all  Food Insecurity: No Food Insecurity (05/02/2022)    Hunger Vital Sign    Worried About Running Out of Food in the Last Year: Never true    Ran Out of Food in the Last Year: Never true  Transportation Needs: No Transportation Needs (05/02/2022)   PRAPARE - Administrator, Civil Service (Medical): No    Lack of Transportation (Non-Medical): No  Physical Activity: Inactive (05/02/2022)   Exercise Vital Sign    Days of Exercise per Week: 0 days    Minutes of Exercise per Session: 0 min  Stress: No Stress Concern Present (05/02/2022)   [employer] of Occupational Health - Occupational Stress Questionnaire    Feeling of Stress : Not at all  Social Connections: Moderately Isolated (05/02/2022)   Social Connection and Isolation Panel [NHANES]    Frequency of Communication with Friends and Family: More than three times a week    Frequency of Social Gatherings with Friends and Family: More than three times a week    Attends Religious Services: More than 4 times per year    Active Member of [employer] or Organizations: No    Attends Banker Meetings: Never    Marital Status: Widowed   Family History  Problem Relation Age of Onset   Other Mother        "natural causes"   Heart disease Mother    Cancer Father        unsure of type   Colon cancer Daughter        2011   Outpatient Encounter Medications as of 08/15/2022  Medication Sig   acetaminophen (TYLENOL) 500 MG tablet Take 1,000 mg by mouth every 6 (six) hours as needed for moderate pain.   Ascorbic Acid (VITAMIN C) 500 MG tablet Take 500 mg by mouth daily.     bimatoprost (LUMIGAN) 0.01 % SOLN Place 1 drop into both eyes at bedtime.   Calcium Carbonate Antacid (TUMS PO) Take 2 tablets by mouth 4 (four) times daily as needed (for acid reflux/indigestion).    Camphor-Menthol-Methyl Sal (SALONPAS) 3.02-26-08 % PTCH Apply 1 patch topically daily as needed (pain).   cetirizine (ZYRTEC) 10 MG tablet Take 10 mg by mouth daily as needed for allergies.   Cyanocobalamin  (VITAMIN B 12 PO) Take 1 tablet by mouth daily.   diclofenac Sodium (VOLTAREN) 1 % GEL Apply 2 g topically 4 (four) times daily. (Patient taking differently: Apply 2 g topically 4 (four) times daily as needed (pain).)   diltiazem (CARDIZEM CD) 300 MG 24 hr capsule Take 1 capsule (300  mg total) by mouth daily.   ELIQUIS 5 MG TABS tablet TAKE ONE TABLET BY MOUTH TWICE DAILY   fluticasone (FLONASE) 50 MCG/ACT nasal spray Place 2 sprays into both nostrils daily.   GARLIC PO Take 1 tablet by mouth daily.   HYDROcodone-acetaminophen (NORCO) 5-325 MG tablet Take 1 tablet by mouth every 6 (six) hours as needed for moderate pain.   losartan (COZAAR) 50 MG tablet TAKE ONE (1) TABLET BY MOUTH EVERY DAY   MAGNESIUM CARBONATE PO Take 1 tablet by mouth at bedtime.   metFORMIN (GLUCOPHAGE) 500 MG tablet Take 1 tablet (500 mg total) by mouth 2 (two) times daily with a meal.   metoprolol succinate (TOPROL-XL) 100 MG 24 hr tablet TAKE 1 TABLET BY MOUTH DAILY WITH FOOD   nitroGLYCERIN (NITROSTAT) 0.4 MG SL tablet DISSOLVE 1 TAB UNDER TOUNGE FOR CHEST PAIN. MAY REPEAT EVERY 5 MINUTES FOR 3 DOSES. IF NO RELIEF CALL 911 OR GO TO ER   rosuvastatin (CRESTOR) 20 MG tablet TAKE ONE (1) TABLET BY MOUTH EVERY DAY   timolol (TIMOPTIC) 0.5 % ophthalmic solution Place 1 drop into both eyes daily.   traZODone (DESYREL) 150 MG tablet Take 1 tablet (150 mg total) by mouth at bedtime.   TURMERIC PO Take 1 tablet by mouth daily.   VENTOLIN HFA 108 (90 Base) MCG/ACT inhaler Inhale 1-2 puffs into the lungs every 4 (four) hours as needed for shortness of breath.   Vitamin D, Cholecalciferol, 10 MCG (400 UNIT) CAPS Take 400 Units by mouth daily.   [DISCONTINUED] levothyroxine (SYNTHROID) 50 MCG tablet Take 1 tablet (50 mcg total) by mouth daily before breakfast.   levothyroxine (SYNTHROID) 50 MCG tablet Take 1 tablet (50 mcg total) by mouth daily before breakfast.   [DISCONTINUED] HYDROcodone-acetaminophen (NORCO) 5-325 MG tablet Take  1 tablet by mouth every 6 (six) hours as needed for moderate pain. (Patient not taking: Reported on 07/11/2022)   [DISCONTINUED] HYDROcodone-acetaminophen (NORCO) 5-325 MG tablet Take 1 tablet by mouth every 6 (six) hours as needed for moderate pain. (Patient not taking: Reported on 07/11/2022)   No facility-administered encounter medications on file as of 08/15/2022.   ALLERGIES: No Known Allergies  VACCINATION STATUS: Immunization History  Administered Date(s) Administered   Covid-19, Mrna,Vaccine(Spikevax)28yrs and older 11/17/2021   Fluad Quad(high Dose 65+) 11/20/2019, 12/03/2020, 12/16/2021   Influenza Split 12/05/2005, 12/03/2006   Influenza, High Dose Seasonal PF 12/25/2016, 01/07/2018   Influenza,inj,quad, With Preservative 12/03/2015   Influenza-Unspecified 01/30/1995, 12/13/2000, 03/16/2003, 01/21/2015, 12/25/2016, 01/07/2018, 11/21/2018   Moderna Covid-19 Vaccine Bivalent Booster 53yrs & up 01/18/2021   Moderna SARS-COV2 Booster Vaccination 12/31/2019, 07/04/2020   Moderna Sars-Covid-2 Vaccination 04/01/2019, 04/29/2019   Pneumococcal Polysaccharide-23 01/30/1995, 12/05/2005, 03/11/2020   Pneumococcal-Unspecified 12/24/2018   Respiratory Syncytial Virus Vaccine,Recomb Aduvanted(Arexvy) 11/17/2021   Tdap 12/24/2018   Zoster Recombinat (Shingrix) 03/04/2021, 06/24/2021    Thyroid Problem Presents for follow-up visit. Symptoms include fatigue and palpitations (having cardiac ablation soon). Patient reports no anxiety, cold intolerance, constipation, depressed mood, heat intolerance, tremors, weight gain or weight loss. The symptoms have been stable.    [name] is 82 y.o. female who is being engaged in follow-up after she was seen in consultation for hypothyroidism.    PCP:  Arrie Senate, FNP.   Patient is assisted by her daughter during visit.   Her labs confirm primary hypothyroidism. Of note, she was treated with amiodarone for almost a year due to cardiac  dysrhythmia .  Patient denies any family history of  thyroid dysfunction, except for her sister who had nodules on her thyroid requiring biopsy which were benign.  She also had suspicious nodules on her thyroid requiring biopsy.  All came back benign, even the one that needed to be sent to [facility] for confirmation.  She reports weight gain, fatigue, cold intolerance, constipation.   Review of systems  Constitutional: + decreasing body weight- per patient and daughter report,  current Body mass index is 26.98 kg/m. , + fatigue, no subjective hyperthermia, no subjective hypothermia Eyes: no blurry vision, no xerophthalmia ENT: no sore throat, no nodules palpated in throat, no dysphagia/odynophagia, no hoarseness, metallic taste in mouth- says feels like blisters Cardiovascular: no chest pain, no shortness of breath, + intermittent palpitations, no leg swelling Respiratory: no cough, no shortness of breath Gastrointestinal: no nausea/vomiting, intermittent diarrhea, Musculoskeletal: no muscle/joint aches Skin: no rashes, no hyperemia Neurological: no tremors, no numbness, no tingling, no dizziness Psychiatric: no depression, no anxiety  Objective:    BP (!) 144/80 (BP Location: Right Arm, Patient Position: Sitting, Cuff Size: Normal) Comment: Retake with manuel cuff- patient has not taken her medications  Pulse 66   Ht 5\' 4"  (1.626 m)   Wt 157 lb 3.2 oz (71.3 kg)   LMP  (LMP Unknown)   BMI 26.98 kg/m   Wt Readings from Last 3 Encounters:  08/15/22 157 lb 3.2 oz (71.3 kg)  07/05/22 154 lb 12.8 oz (70.2 kg)  06/06/22 158 lb (71.7 kg)    BP Readings from Last 3 Encounters:  08/15/22 (!) 144/80  07/05/22 (!) 164/72  06/06/22 129/73     Physical Exam- Limited  Constitutional:  Body mass index is 26.98 kg/m. , not in acute distress, normal state of mind Eyes:  EOMI, no exophthalmos Neck: Supple Cardiovascular: irregular rhythm- every 3rd beat has an extra beat, no murmurs, rubs,  or gallops, no edema Musculoskeletal: no gross deformities, strength intact in all four extremities, no gross restriction of joint movements Skin:  no rashes, no hyperemia Neurological: no tremor with outstretched hands    CMP ( most recent) CMP     Component Value Date/Time   NA 141 01/24/2022 1026   K 4.3 01/24/2022 1026   CL 102 01/24/2022 1026   CO2 25 01/24/2022 1026   GLUCOSE 182 (H) 01/24/2022 1026   GLUCOSE 162 (H) 12/14/2021 1341   BUN 10 01/24/2022 1026   CREATININE 0.82 01/24/2022 1026   CALCIUM 9.7 01/24/2022 1026   PROT 6.4 01/24/2022 1026   ALBUMIN 4.4 01/24/2022 1026   AST 21 01/24/2022 1026   ALT 16 01/24/2022 1026   ALKPHOS 92 01/24/2022 1026   BILITOT 0.3 01/24/2022 1026   GFRNONAA >60 12/14/2021 1341   GFRAA >60 10/21/2019 1347     Diabetic Labs (most recent): Lab Results  Component Value Date   HGBA1C 8.2 (H) 04/20/2022   HGBA1C 7.4 (H) 01/24/2022   HGBA1C 6.4 (H) 06/24/2021     Lipid Panel ( most recent) Lipid Panel     Component Value Date/Time   CHOL 109 01/24/2022 1026   TRIG 87 01/24/2022 1026   HDL 41 01/24/2022 1026   CHOLHDL 2.7 01/24/2022 1026   CHOLHDL 2.4 03/09/2020 0209   VLDL 12 03/09/2020 0209   LDLCALC 51 01/24/2022 1026   LABVLDL 17 01/24/2022 1026      Lab Results  Component Value Date   TSH 0.462 08/10/2022   TSH 0.529 07/26/2021   TSH 0.232 (L) 02/07/2021   TSH 1.995 01/10/2021  TSH 0.552 08/27/2020   TSH 0.745 08/06/2020   TSH 6.335 (H) 05/05/2020   TSH 10.867 (H) 03/03/2020   TSH 20.523 (H) 02/03/2020   TSH 16.400 (H) 01/30/2020   FREET4 1.18 (H) 08/10/2022   FREET4 1.06 07/26/2021   FREET4 1.37 (H) 02/07/2021   FREET4 1.26 (H) 08/06/2020   FREET4 0.95 05/05/2020   FREET4 0.65 03/03/2020   FREET4 0.44 (L) 02/03/2020     Thyroid US on 04/29/20 CLINICAL DATA:  Hyperthyroidism   EXAM: THYROID ULTRASOUND   TECHNIQUE: Ultrasound examination of the thyroid gland and adjacent soft tissues was  performed.   COMPARISON:  None.   FINDINGS: Parenchymal Echotexture: Mildly heterogeneous   Isthmus: 0.7 cm   Right lobe: 6.2 x 2.6 x 2.2 cm   Left lobe: 5.0 x 2.1 x 2.2 cm   _________________________________________________________   Estimated total number of nodules >/= 1 cm: 7   Number of spongiform nodules >/=  2 cm not described below (TR1): 0   Number of mixed cystic and solid nodules >/= 1.5 cm not described below (TR2): 0   _________________________________________________________   Nodule # 1:   Location: Right; superior   Maximum size: 1.3 cm; Other 2 dimensions: 1.3 x 1.0 cm   Composition: solid/almost completely solid (2)   Echogenicity: hypoechoic (2)   Shape: taller-than-wide (3)   Margins: smooth (0)   Echogenic foci: none (0)   ACR TI-RADS total points: 7.   ACR TI-RADS risk category: TR5 (>/= 7 points).   ACR TI-RADS recommendations:   **Given size (>/= 1.0 cm) and appearance, fine needle aspiration of this highly suspicious nodule should be considered based on TI-RADS criteria.   _________________________________________________________   Nodule # 2:   Location: Right; superior   Maximum size: 2.2 cm; Other 2 dimensions: 1.7 x 1.7 cm   Composition: solid/almost completely solid (2)   Echogenicity: isoechoic (1)   Shape: taller-than-wide (3)   Margins: smooth (0)   Echogenic foci: none (0)   ACR TI-RADS total points: 4.   ACR TI-RADS risk category: TR4 (4-6 points).   ACR TI-RADS recommendations:   **Given size (>/= 1.5 cm) and appearance, fine needle aspiration of this moderately suspicious nodule should be considered based on TI-RADS criteria.   _________________________________________________________   Nodule # 3:   Location: Right; Mid   Maximum size: 2.3 cm; Other 2 dimensions: 1.7 x 2.2 cm   Composition: solid/almost completely solid (2)   Echogenicity: hypoechoic (2)   Shape: not taller-than-wide (0)    Margins: smooth (0)   Echogenic foci: none (0)   ACR TI-RADS total points: 4.   ACR TI-RADS risk category: TR4 (4-6 points).   ACR TI-RADS recommendations:   **Given size (>/= 1.5 cm) and appearance, fine needle aspiration of this moderately suspicious nodule should be considered based on TI-RADS criteria.   _________________________________________________________   Nodule # 4:   Location: Right; Inferior   Maximum size: 1.6 cm; Other 2 dimensions: 1.0 x 1.2 cm   Composition: mixed cystic and solid (1)   Echogenicity: isoechoic (1)   Shape: not taller-than-wide (0)   Margins: smooth (0)   Echogenic foci: none (0)   ACR TI-RADS total points: 2.   ACR TI-RADS risk category: TR2 (2 points).   ACR TI-RADS recommendations:   This nodule does NOT meet TI-RADS criteria for biopsy or dedicated follow-up.   _________________________________________________________   Nodule # 5:   Location: Left; superior   Maximum size: 1.8 cm; Other 2 dimensions:  1.3 x 1.8 cm   Composition: solid/almost completely solid (2)   Echogenicity: isoechoic (1)   Shape: not taller-than-wide (0)   Margins: smooth (0)   Echogenic foci: none (0)   ACR TI-RADS total points: 3.   ACR TI-RADS risk category: TR3 (3 points).   ACR TI-RADS recommendations:   *Given size (>/= 1.5 - 2.4 cm) and appearance, a follow-up ultrasound in 1 year should be considered based on TI-RADS criteria.   _________________________________________________________   Nodule # 6:   Location: Left; mid   Maximum size: 1.9 cm; Other 2 dimensions: 1.7 x 1.8 cm   Composition: solid/almost completely solid (2)   Echogenicity: isoechoic (1)   Shape: taller-than-wide (3)   Margins: ill-defined (0)   Echogenic foci: none (0)   ACR TI-RADS total points: 6.   ACR TI-RADS risk category: TR4 (4-6 points).   ACR TI-RADS recommendations:   **Given size (>/= 1.5 cm) and appearance, fine needle  aspiration of this moderately suspicious nodule should be considered based on TI-RADS criteria.   _________________________________________________________   Nodule # 7: 1.1 cm cystic nodule in the inferior left thyroid lobe does not meet criteria for FNA or imaging follow-up.   IMPRESSION: 1. Nodule 1 (TI-RADS 5) located in the superior right thyroid lobe, measuring 1.3 x 1.3 x 1.0 cm, meets criteria for FNA. 2. Nodule 2 (TI-RADS 4), located in the superior right thyroid lobe, measuring 2.2 x 1.7 x 1.7 cm, meets criteria for FNA. 3. Nodule 3 (TI-RADS 4) located in the mid right thyroid lobe, measuring 2.3 x 1.7 x 2.2 cm, meets criteria for FNA. 4. Nodule 5 (TI-RADS 3) located in the superior left thyroid lobe, measuring 1.8 x 1.3 x 1.8 cm, meets criteria for imaging follow-up. Next ultrasound should be performed in 1 year. 5. Nodule 6 (TI-RADS 4), located in the mid left thyroid lobe, measuring 1.9 x 1.7 x 1.8 cm, meets criteria for FNA. 6. Order of suspicion of nodules for consideration for FNA: 1, 3, 2, 6   The above is in keeping with the ACR TI-RADS recommendations - J Am Coll Radiol 2017;14:587-595.     Electronically Signed   By: Acquanetta Belling M.D.   On: 04/29/2020 16:12 -------------------------------------------------------------------------------------------------------------------------- Cytology - Non PAP  CASE: [id number]  PATIENT: Sharmayne Caradonna  Non-Gynecological Cytology Report    FNA Thyroid Nodule Biopsy  Clinical History: 2.3 cm RML  Specimen Submitted:  A. THYROID, RML, FINE NEEDLE ASPIRATION:    FINAL MICROSCOPIC DIAGNOSIS:  - Atypia of undetermined significance (Bethesda category III)   SPECIMEN ADEQUACY:  Satisfactory for evaluation   DIAGNOSTIC COMMENTS:  This specimen will be sent for Afirma testing.   GROSS:  Received is/are 3 slides in 95% Ethyl alcohol, 3 air dried slides for  diff stain, and 30 ccs of pale pink Cytolyt solution.  (CM:cm)  Prepared:  Smears:  6  Concentration Method (Thin Prep):  1  Cell Block:  Cell block attempted, not obtained.  Additional Studies:  Also there was an Afirma collected. For RUL Thyroid  FNA, see [id number].   CYTOLOGY - NON PAP  CASE: [id number]  PATIENT: Rilya Hardeman  Non-Gynecological Cytology Report          FNA Thyroid Nodule Biopsy  Clinical History: 1.3 cm RUL  Specimen Submitted:  A. THYROID, RUL, FINE NEEDLE ASPIRATION:    FINAL MICROSCOPIC DIAGNOSIS:  - Consistent with benign follicular nodule (Bethesda category II)   SPECIMEN ADEQUACY:  Satisfactory for evaluation   GROSS:  Received is/are 4 slides in 95% Ethyl alcohol, 4 air dried slides for  diff stain, and 30 ccs of pale pink Cytolyt solution. (CM:cm)  Prepared:  Smears:  8  Concentration Method (Thin Prep):  1  Cell Block:  Cell block attempted, not obtained.  Additional Studies:  Also there was an Afirma collected. For RML Thyroid  FNA, see [id number].    Repeat thyroid US from 07/26/21 CLINICAL DATA:  Goiter. Multifocal thyroid ultrasound. Patient previously underwent biopsy of right superior and right mid thyroid nodules in March of 2022   EXAM: THYROID ULTRASOUND   TECHNIQUE: Ultrasound examination of the thyroid gland and adjacent soft tissues was performed.   COMPARISON:  Prior thyroid ultrasound 04/29/2020   FINDINGS: Parenchymal Echotexture: Markedly heterogenous   Isthmus: 0.5 cm   Right lobe: 4.5 x 2.1 x 2.4 cm   Left lobe: 4.5 x 2.0 x 2.1 cm   _________________________________________________________   Estimated total number of nodules >/= 1 cm: 5   Number of spongiform nodules >/=  2 cm not described below (TR1): 0   Number of mixed cystic and solid nodules >/= 1.5 cm not described below (TR2): 0   _________________________________________________________   The thyroid gland is diffusely heterogeneous, enlarged and nodular. Many of the areas of "nodularity  "are inseparable from the background thyroid parenchyma and most likely to represent areas of pseudo nodularity.   Nodule # 1: The previously biopsied nodule/cluster of nodules in the right superior gland demonstrates no significant interval change compared to prior imaging obtained at the time of biopsy.   Nodule # 3: The previously biopsied nodule with central dystrophic calcifications in the right mid to lower gland measures 1.9 x 1.7 x 1.2 cm; slightly smaller than 2.3 by 1.7 x 2.2 cm previously.   Additional measured areas either represent benign-appearing spongiform nodules, or vague regions of pseudo nodularity. No other discrete nodules identified that would warrant further evaluation.   IMPRESSION: Overall, unchanged appearance of the thyroid gland which is diffusely enlarged, heterogeneous and lobular with multiple regions of pseudonodularity.   The previously biopsied nodules in the right upper and right mid gland demonstrate no significant interval change.   No additional foci of definitive nodularity that would warrant biopsy or dedicated imaging surveillance.     Electronically Signed   By: Malachy Moan M.D.   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  She is advised to continue her dose of Levothyroxine 50 mcg po daily before breakfast but skip 1 day out of the week.    - We discussed about the correct intake of her thyroid hormone, on empty stomach at fasting, with water, separated by at least 30 minutes from breakfast and other medications,  and  separated by more than 4 hours from calcium, iron, multivitamins, acid reflux medications (PPIs). -Patient is made aware of the fact that thyroid hormone replacement is needed for life, dose to be adjusted by periodic monitoring of thyroid function tests.   2. Multinodular Goiter -Her thyroid ultrasound shows multinodular goiter with 4 moderately suspicious nodules that meet criteria for FNA  and 1 nodule recommending follow up with ultrasound in 1 year.   All biopsies were confirmed to be benign.   Her follow up ultrasound shows stable MNG, no further surveillance is needed at this time.    - she is advised to maintain close follow up with Milian, Aleen Campi, FNP for primary care needs.       I spent  34  minutes in the care of the patient today including review of labs from Thyroid Function, CMP, and other relevant labs ; imaging/biopsy records (current and previous including abstractions from other facilities); face-to-face time discussing  her lab results and symptoms, medications doses, her options of short and long term treatment based on the latest standards of care / guidelines;   and documenting the encounter.  [name]  participated in the discussions, expressed understanding, and voiced agreement with the above plans.  All questions were answered to her satisfaction. she is encouraged to contact clinic should she have any questions or concerns prior to her return visit.   Follow up plan: Return in about 4 months (around 12/15/2022) for Thyroid follow up, Previsit labs.  Ronny Bacon, [facility] [address] [place], [place] [address] Phone: [phone] Fax: [phone]   08/15/2022, 10:57 AM

## 2022-08-25 ENCOUNTER — Encounter: Payer: Medicare Other | Admitting: Cardiovascular Disease

## 2022-08-31 ENCOUNTER — Encounter: Payer: Self-pay | Admitting: Family Medicine

## 2022-08-31 ENCOUNTER — Other Ambulatory Visit: Payer: Self-pay | Admitting: Family Medicine

## 2022-08-31 ENCOUNTER — Ambulatory Visit (INDEPENDENT_AMBULATORY_CARE_PROVIDER_SITE_OTHER): Payer: 59 | Admitting: Family Medicine

## 2022-08-31 VITALS — BP 150/60 | HR 72 | Temp 98.3°F | Ht 64.0 in | Wt 154.0 lb

## 2022-08-31 DIAGNOSIS — M549 Dorsalgia, unspecified: Secondary | ICD-10-CM

## 2022-08-31 DIAGNOSIS — G8929 Other chronic pain: Secondary | ICD-10-CM | POA: Diagnosis not present

## 2022-08-31 DIAGNOSIS — M79605 Pain in left leg: Secondary | ICD-10-CM

## 2022-08-31 DIAGNOSIS — I10 Essential (primary) hypertension: Secondary | ICD-10-CM

## 2022-08-31 DIAGNOSIS — M545 Low back pain, unspecified: Secondary | ICD-10-CM | POA: Diagnosis not present

## 2022-08-31 DIAGNOSIS — F5101 Primary insomnia: Secondary | ICD-10-CM

## 2022-08-31 DIAGNOSIS — Z79899 Other long term (current) drug therapy: Secondary | ICD-10-CM | POA: Insufficient documentation

## 2022-08-31 DIAGNOSIS — M25552 Pain in left hip: Secondary | ICD-10-CM

## 2022-08-31 DIAGNOSIS — I480 Paroxysmal atrial fibrillation: Secondary | ICD-10-CM | POA: Diagnosis not present

## 2022-08-31 DIAGNOSIS — R6889 Other general symptoms and signs: Secondary | ICD-10-CM

## 2022-08-31 DIAGNOSIS — M25551 Pain in right hip: Secondary | ICD-10-CM

## 2022-08-31 DIAGNOSIS — F339 Major depressive disorder, recurrent, unspecified: Secondary | ICD-10-CM | POA: Insufficient documentation

## 2022-08-31 MED ORDER — HYDROCODONE-ACETAMINOPHEN 5-325 MG PO TABS
1.0000 | ORAL_TABLET | Freq: Two times a day (BID) | ORAL | 0 refills | Status: AC | PRN
Start: 2022-09-30 — End: 2022-10-30

## 2022-08-31 MED ORDER — HYDROCODONE-ACETAMINOPHEN 5-325 MG PO TABS
1.0000 | ORAL_TABLET | Freq: Two times a day (BID) | ORAL | 0 refills | Status: AC | PRN
Start: 2022-08-31 — End: 2022-09-30

## 2022-08-31 MED ORDER — HYDROCODONE-ACETAMINOPHEN 5-325 MG PO TABS
1.0000 | ORAL_TABLET | Freq: Two times a day (BID) | ORAL | 0 refills | Status: AC | PRN
Start: 2022-10-30 — End: 2022-11-29

## 2022-08-31 NOTE — Progress Notes (Signed)
Acute Office Visit  Subjective:  Patient ID: [name], female    DOB: August 25, 1940, 82 y.o.   MRN: [id number]  Chief Complaint  Patient presents with   Medical Management of Chronic Issues    Med refill Caregiver forms   HPI Patient is in today for pain management follow up  Pain  States most of her pain is in her hips and lower back. States that it comes and goes in intensity, but pain is always there. She has to have support to walk due to pain in order to complete activities. She takes norco as needed. Some days she takes them twice per day and some days she does not take them at all. Her last dose was Sunday. When she does not take norco, she takes tylenol arthritis and uses voltaren gel. Rates her pain as 7/10 today.   Hypertension  Has not been taking BP at home.  Continues to follow with Cardiology. Takes losartan and diltiazem. Has not needed nitroglycerin lately.   Afib Of note, reports that her Afib has been "acting up" last Thursday and today. States that she can feel palpitations and like she may "black out" on occasion. Denies falls.   Forgetfulness  States that she is still having some forgetfulness. Describes that today she forgot to change out her shoes before coming to her appt.  States that she has to use a tablet to keep reminders of everything to be done.  Presents today with caregiver forms to be completed for daughter to become caregiver.   ROS As per HPI  Objective:  BP (!) 157/59   Pulse 72   Temp 98.3 F (36.8 C)   Ht 5\' 4"  (1.626 m)   Wt 154 lb (69.9 kg)   LMP  (LMP Unknown)   SpO2 97%   BMI 26.43 kg/m   Physical Exam Constitutional:      General: She is awake. She is not in acute distress.    Appearance: Normal appearance. She is well-developed and well-groomed. She is not ill-appearing, toxic-appearing or diaphoretic.  Cardiovascular:     Rate and Rhythm: Normal rate. Rhythm irregular. Extrasystoles are present.    Pulses: Normal  pulses.          Radial pulses are 2+ on the right side and 2+ on the left side.       Posterior tibial pulses are 2+ on the right side and 2+ on the left side.     Heart sounds: Murmur heard.     No gallop.  Pulmonary:     Effort: Pulmonary effort is normal. No respiratory distress.     Breath sounds: Normal breath sounds. No stridor. No wheezing, rhonchi or rales.  Musculoskeletal:     Cervical back: Full passive range of motion without pain and neck supple.     Right lower leg: No edema.     Left lower leg: No edema.  Skin:    General: Skin is warm.     Capillary Refill: Capillary refill takes less than 2 seconds.  Neurological:     General: No focal deficit present.     Mental Status: She is alert and easily aroused. Mental status is at baseline.     GCS: GCS eye subscore is 4. GCS verbal subscore is 5. GCS motor subscore is 6.     Motor: Weakness present. No tremor.  Psychiatric:        Attention and Perception: Attention and perception normal.  Mood and Affect: Mood and affect normal.        Speech: Speech normal.        Behavior: Behavior normal. Behavior is cooperative.        Thought Content: Thought content normal. Thought content does not include homicidal or suicidal ideation. Thought content does not include homicidal or suicidal plan.        Cognition and Memory: Cognition is impaired. Memory is impaired. She exhibits impaired recent memory and impaired remote memory.        Judgment: Judgment normal.       08/31/2022    2:27 PM 06/06/2022   11:00 AM 05/09/2022   10:33 AM  Depression screen PHQ 2/9  Decreased Interest 0 2 1  Down, Depressed, Hopeless 0 2 1  PHQ - 2 Score 0 4 2  Altered sleeping 1 2 2   Tired, decreased energy 1 2 2   Change in appetite 1 2 0  Feeling bad or failure about yourself  0 1 2  Trouble concentrating 1 2 2   Moving slowly or fidgety/restless 0 0 0  Suicidal thoughts 0 0 0  PHQ-9 Score 4 13 10   Difficult doing work/chores Somewhat  difficult Somewhat difficult Somewhat difficult      08/31/2022    2:27 PM 06/06/2022   11:00 AM 05/09/2022   10:34 AM 04/20/2022   10:50 AM  GAD 7 : Generalized Anxiety Score  Nervous, Anxious, on Edge 1 1 1 2   Control/stop worrying 1 1 2 2   Worry too much - different things 1 1 2 2   Trouble relaxing 0 1 2 0  Restless 0 0 1 0  Easily annoyed or irritable 1 1 2 1   Afraid - awful might happen 0 0 1 0  Total GAD 7 Score 4 5 11 7   Anxiety Difficulty Not difficult at all Somewhat difficult Somewhat difficult     Assessment & Plan:  1. Chronic hip pain, bilateral Medications refilled as below. Reviewed PDMP. No red flags. Patient to follow up in 3 months with Toxassure as 04/20/22 toxassure was negative for declared medication.  - HYDROcodone-acetaminophen (NORCO) 5-325 MG tablet; Take 1 tablet by mouth 2 (two) times daily as needed for moderate pain.  Dispense: 30 tablet; Refill: 0 - HYDROcodone-acetaminophen (NORCO) 5-325 MG tablet; Take 1 tablet by mouth 2 (two) times daily as needed for moderate pain.  Dispense: 30 tablet; Refill: 0 - HYDROcodone-acetaminophen (NORCO) 5-325 MG tablet; Take 1 tablet by mouth 2 (two) times daily as needed for moderate pain.  Dispense: 30 tablet; Refill: 0  2. Pain of back and left lower extremity Medications refilled as below. Reviewed PDMP. No red flags. Patient to follow up in 3 months with Toxassure as 04/20/22 toxassure was negative for declared medication.  - HYDROcodone-acetaminophen (NORCO) 5-325 MG tablet; Take 1 tablet by mouth 2 (two) times daily as needed for moderate pain.  Dispense: 30 tablet; Refill: 0 - HYDROcodone-acetaminophen (NORCO) 5-325 MG tablet; Take 1 tablet by mouth 2 (two) times daily as needed for moderate pain.  Dispense: 30 tablet; Refill: 0 - HYDROcodone-acetaminophen (NORCO) 5-325 MG tablet; Take 1 tablet by mouth 2 (two) times daily as needed for moderate pain.  Dispense: 30 tablet; Refill: 0  3. Chronic midline low back pain  without sciatica Medications refilled as below. Reviewed PDMP. No red flags. Patient to follow up in 3 months with Toxassure as 04/20/22 toxassure was negative for declared medication.  - HYDROcodone-acetaminophen (NORCO) 5-325 MG tablet; Take 1  tablet by mouth 2 (two) times daily as needed for moderate pain.  Dispense: 30 tablet; Refill: 0 - HYDROcodone-acetaminophen (NORCO) 5-325 MG tablet; Take 1 tablet by mouth 2 (two) times daily as needed for moderate pain.  Dispense: 30 tablet; Refill: 0 - HYDROcodone-acetaminophen (NORCO) 5-325 MG tablet; Take 1 tablet by mouth 2 (two) times daily as needed for moderate pain.  Dispense: 30 tablet; Refill: 0  4. Controlled substance agreement signed Signed 04/28/22 Patient to follow up in 3 months with Toxassure as 04/20/22 toxassure was negative for declared medication.   5. Primary hypertension Not at goal today. Instructed patient to monitor BP at home and reports measurements to PCP and Cardiology.   6. Forgetfulness Continue to monitor for safety. Completed caregiver form today for daughter to become caregiver.   7. Paroxysmal atrial fibrillation (HCC) Reviewed ILR report. Patient to follow up with Cardiology.    The above assessment and management plan was discussed with the patient. The patient verbalized understanding of and has agreed to the management plan using shared-decision making. Patient is aware to call the clinic if they develop any new symptoms or if symptoms fail to improve or worsen. Patient is aware when to return to the clinic for a follow-up visit. Patient educated on when it is appropriate to go to the emergency department.   Return in about 3 months (around 12/01/2022) for Chronic Condition Follow up.  Neale Burly, DNP-FNP [facility] [address] [place], [place] [address] [phone]

## 2022-09-01 ENCOUNTER — Telehealth: Payer: Self-pay | Admitting: Family Medicine

## 2022-09-04 NOTE — Progress Notes (Signed)
 Carelink Summary Report / Loop Recorder 

## 2022-09-05 NOTE — Telephone Encounter (Signed)
Aware ppw ready for pickup

## 2022-09-14 ENCOUNTER — Other Ambulatory Visit: Payer: Self-pay | Admitting: Cardiology

## 2022-09-14 NOTE — Telephone Encounter (Signed)
Eliquis 5mg  refill request received. Patient is 82 years old, weight-69.9kg, Crea-0.82 on 01/24/22, Diagnosis-Afib, and last seen by Dr. Diona Browner on 07/05/22. Dose is appropriate based on dosing criteria. Will send in refill to requested pharmacy.

## 2022-09-18 ENCOUNTER — Ambulatory Visit (INDEPENDENT_AMBULATORY_CARE_PROVIDER_SITE_OTHER): Payer: 59

## 2022-09-18 DIAGNOSIS — I4819 Other persistent atrial fibrillation: Secondary | ICD-10-CM | POA: Diagnosis not present

## 2022-09-18 LAB — CUP PACEART REMOTE DEVICE CHECK
Date Time Interrogation Session: 20240727230157
Implantable Pulse Generator Implant Date: 20220210

## 2022-09-26 ENCOUNTER — Other Ambulatory Visit: Payer: Self-pay | Admitting: Cardiology

## 2022-10-03 DIAGNOSIS — E119 Type 2 diabetes mellitus without complications: Secondary | ICD-10-CM | POA: Diagnosis not present

## 2022-10-03 DIAGNOSIS — H02834 Dermatochalasis of left upper eyelid: Secondary | ICD-10-CM | POA: Diagnosis not present

## 2022-10-03 DIAGNOSIS — H26491 Other secondary cataract, right eye: Secondary | ICD-10-CM | POA: Diagnosis not present

## 2022-10-03 DIAGNOSIS — H04123 Dry eye syndrome of bilateral lacrimal glands: Secondary | ICD-10-CM | POA: Diagnosis not present

## 2022-10-03 DIAGNOSIS — H02831 Dermatochalasis of right upper eyelid: Secondary | ICD-10-CM | POA: Diagnosis not present

## 2022-10-03 DIAGNOSIS — H401131 Primary open-angle glaucoma, bilateral, mild stage: Secondary | ICD-10-CM | POA: Diagnosis not present

## 2022-10-03 LAB — HM DIABETES EYE EXAM

## 2022-10-05 NOTE — Progress Notes (Signed)
Carelink Summary Report / Loop Recorder 

## 2022-10-06 ENCOUNTER — Other Ambulatory Visit: Payer: 59 | Admitting: *Deleted

## 2022-10-06 ENCOUNTER — Encounter: Payer: Self-pay | Admitting: *Deleted

## 2022-10-06 ENCOUNTER — Telehealth: Payer: 59

## 2022-10-06 NOTE — Patient Instructions (Signed)
Visit Information  Thank you for taking time to visit with me today. Please don't hesitate to contact me if I can be of assistance to you before our next scheduled telephone appointment.  Following are the goals we discussed today:   Goals Addressed             This Visit's Progress    CCM (ATRIAL FIBRILLATION) EXPECTED OUTCOME: MONITOR, SELF-MANAGE AND REDUCE SYMPTOMS OF ATRIAL FIBRILLATION       Current Barriers:  Knowledge Deficits related to Atrial fibrillation management Chronic Disease Management support and education needs related to Atrial fibrillation Patient reports she has all medications and taking as prescribed including blood thinner Patient reports she has a cane and did not discuss obtaining a walker, will reconsider in the future No new concerns reported  Planned Interventions: Counseled on increased risk of stroke due to Afib and benefits of anticoagulation for stroke prevention           Reviewed importance of adherence to anticoagulant exactly as prescribed Advised patient to discuss issues with heart rate, exacerbation of A-fib with provider Counseled on importance of regular laboratory monitoring as prescribed Afib action plan reviewed Screening for signs and symptoms of depression related to chronic disease state Assessed social determinant of health barriers Reviewed upcoming scheduled appointments Atrial fibrillation action plan reinforced  Symptom Management: Take medications as prescribed   Attend all scheduled provider appointments Call pharmacy for medication refills 3-7 days in advance of running out of medications Attend church or other social activities Perform all self care activities independently  Call provider office for new concerns or questions  - check pulse (heart) rate before taking medicine - make a plan to exercise regularly - make a plan to eat healthy - keep all lab appointments - take medicine as prescribed - wear medical alert  identification Follow Atrial fibrillation action plan- call your doctor as needed for symptom management  Follow Up Plan: Telephone follow up appointment with care management team member scheduled for:  01/02/23 at 3 pm       CCM (DIABETES) EXPECTED OUTCOME: MONITOR, SELF- MANAGE AND REDUCE SYMPTOMS OF DIABETES       Current Barriers:  Knowledge Deficits related to Diabetes management Care Coordination needs related to medication management in a patient with Diabetes Chronic Disease Management support and education needs related to Diabetes, diet Patient reports she lives alone, has lots of support from her adult daughter and family, pt has never driven and daughter transports pt to all appointments and running errands, etc. Patient reports she used to check CBG years ago and not interested in checking now, does not like needles, states she was taken off metformin and was no longer on any medication for diabetes, has now been placed back on metformin, AIC increased to 8.2 up from 6.4 and 7.4 over past 2 years Patient reports her granddaughter is willing to check CBG and pt sees her frequently, pt states she has not started checking CBG, still not interested in at present  Planned Interventions: Reviewed medications with patient and discussed importance of medication adherence;        Counseled on importance of regular laboratory monitoring as prescribed;        Discussed plans with patient for ongoing care management follow up and provided patient with direct contact information for care management team;      Advised patient, providing education and rationale, to check cbg per doctor's order  and record  call provider for findings outside established parameters;       Review of patient status, including review of consultants reports, relevant laboratory and other test results, and medications completed;       Reviewed importance of having CBG checked by granddaughter  Reviewed  carbohydrate modified diet  Symptom Management: Take medications as prescribed   Attend all scheduled provider appointments Call pharmacy for medication refills 3-7 days in advance of running out of medications Attend church or other social activities Perform all self care activities independently  Call provider office for new concerns or questions  check blood sugar at prescribed times: per doctor's order  check feet daily for cuts, sores or redness enter blood sugar readings and medication or insulin into daily log take the blood sugar log to all doctor visits take the blood sugar meter to all doctor visits trim toenails straight across eat fish at least once per week fill half of plate with vegetables limit fast food meals to no more than 1 per week manage portion size prepare main meal at home 3 to 5 days each week read food labels for fat, fiber, carbohydrates and portion size set a realistic goal wash and dry feet carefully every day wear comfortable, cotton socks wear comfortable, well-fitting shoes Consider having your granddaughter check your blood sugar  Follow Up Plan: Telephone follow up appointment with care management team member scheduled for:   01/02/23 at 3 pm           Our next appointment is by telephone on 01/02/23 at 3 pm  Please call the care guide team at [phone] if you need to cancel or reschedule your appointment.   If you are experiencing a Mental Health or Behavioral Health Crisis or need someone to talk to, please call the Suicide and Crisis Lifeline: 988 call the [place] National Suicide Prevention Lifeline: 604-530-1357 or TTY: (519)152-7255 TTY 912-687-8707) to talk to a trained counselor call 1-800-273-TALK (toll free, 24 hour hotline) go to [facility] [address] [place] [phone]) call the [facility] Crisis Line: (365)623-8197 call 911   Patient verbalizes understanding of  instructions and care plan provided today and agrees to view in MyChart. Active MyChart status and patient understanding of how to access instructions and care plan via MyChart confirmed with patient.     Telephone follow up appointment with care management team member scheduled for: 01/02/23 at 3 pm  Irving Shows [facility], BSN [place]/ Ambulatory Care Management [phone]'

## 2022-10-06 NOTE — Patient Outreach (Signed)
Care Management   Visit Note  10/06/2022 Name: [name] MRN: [id number] DOB: [birth year]  Subjective: [name] is a 82 y.o. year old female who is a primary care patient of Ellamae Sia, Aleen Campi, FNP. The Care Management team was consulted for assistance.      Engaged with patient spoke with patient by telephone for follow up    Goals Addressed             This Visit's Progress    CCM (ATRIAL FIBRILLATION) EXPECTED OUTCOME: MONITOR, SELF-MANAGE AND REDUCE SYMPTOMS OF ATRIAL FIBRILLATION       Current Barriers:  Knowledge Deficits related to Atrial fibrillation management Chronic Disease Management support and education needs related to Atrial fibrillation Patient reports she has all medications and taking as prescribed including blood thinner Patient reports she has a cane and did not discuss obtaining a walker, will reconsider in the future No new concerns reported  Planned Interventions: Counseled on increased risk of stroke due to Afib and benefits of anticoagulation for stroke prevention           Reviewed importance of adherence to anticoagulant exactly as prescribed Advised patient to discuss issues with heart rate, exacerbation of A-fib with provider Counseled on importance of regular laboratory monitoring as prescribed Afib action plan reviewed Screening for signs and symptoms of depression related to chronic disease state Assessed social determinant of health barriers Reviewed upcoming scheduled appointments Atrial fibrillation action plan reinforced  Symptom Management: Take medications as prescribed   Attend all scheduled provider appointments Call pharmacy for medication refills 3-7 days in advance of running out of medications Attend church or other social activities Perform all self care activities independently  Call provider office for new concerns or questions  - check pulse (heart) rate before taking medicine - make a plan to exercise  regularly - make a plan to eat healthy - keep all lab appointments - take medicine as prescribed - wear medical alert identification Follow Atrial fibrillation action plan- call your doctor as needed for symptom management  Follow Up Plan: Telephone follow up appointment with care management team member scheduled for:  01/02/23 at 3 pm       CCM (DIABETES) EXPECTED OUTCOME: MONITOR, SELF- MANAGE AND REDUCE SYMPTOMS OF DIABETES       Current Barriers:  Knowledge Deficits related to Diabetes management Care Coordination needs related to medication management in a patient with Diabetes Chronic Disease Management support and education needs related to Diabetes, diet Patient reports she lives alone, has lots of support from her adult daughter and family, pt has never driven and daughter transports pt to all appointments and running errands, etc. Patient reports she used to check CBG years ago and not interested in checking now, does not like needles, states she was taken off metformin and was no longer on any medication for diabetes, has now been placed back on metformin, AIC increased to 8.2 up from 6.4 and 7.4 over past 2 years Patient reports her granddaughter is willing to check CBG and pt sees her frequently, pt states she has not started checking CBG, still not interested in at present  Planned Interventions: Reviewed medications with patient and discussed importance of medication adherence;        Counseled on importance of regular laboratory monitoring as prescribed;        Discussed plans with patient for ongoing care management follow up and provided patient with direct contact information for care management team;  Advised patient, providing education and rationale, to check cbg per doctor's order  and record        call provider for findings outside established parameters;       Review of patient status, including review of consultants reports, relevant laboratory and other test  results, and medications completed;       Reviewed importance of having CBG checked by granddaughter  Reviewed carbohydrate modified diet  Symptom Management: Take medications as prescribed   Attend all scheduled provider appointments Call pharmacy for medication refills 3-7 days in advance of running out of medications Attend church or other social activities Perform all self care activities independently  Call provider office for new concerns or questions  check blood sugar at prescribed times: per doctor's order  check feet daily for cuts, sores or redness enter blood sugar readings and medication or insulin into daily log take the blood sugar log to all doctor visits take the blood sugar meter to all doctor visits trim toenails straight across eat fish at least once per week fill half of plate with vegetables limit fast food meals to no more than 1 per week manage portion size prepare main meal at home 3 to 5 days each week read food labels for fat, fiber, carbohydrates and portion size set a realistic goal wash and dry feet carefully every day wear comfortable, cotton socks wear comfortable, well-fitting shoes Consider having your granddaughter check your blood sugar  Follow Up Plan: Telephone follow up appointment with care management team member scheduled for:   01/02/23 at 3 pm           Plan: Telephone follow up appointment with care management team member scheduled for: 01/02/23 at 3 pm  [name] [facility], BSN [place]/ Ambulatory Care Management [phone]

## 2022-10-11 ENCOUNTER — Other Ambulatory Visit: Payer: Self-pay | Admitting: Cardiology

## 2022-10-22 LAB — CUP PACEART REMOTE DEVICE CHECK
Date Time Interrogation Session: 20240829230242
Implantable Pulse Generator Implant Date: 20220210

## 2022-10-24 ENCOUNTER — Ambulatory Visit (INDEPENDENT_AMBULATORY_CARE_PROVIDER_SITE_OTHER): Payer: 59

## 2022-10-24 DIAGNOSIS — I4819 Other persistent atrial fibrillation: Secondary | ICD-10-CM | POA: Diagnosis not present

## 2022-11-06 DIAGNOSIS — G509 Disorder of trigeminal nerve, unspecified: Secondary | ICD-10-CM | POA: Diagnosis not present

## 2022-11-06 NOTE — Progress Notes (Signed)
Carelink Summary Report / Loop Recorder 

## 2022-11-07 ENCOUNTER — Telehealth: Payer: Self-pay

## 2022-11-07 NOTE — Telephone Encounter (Signed)
Called pt to get most recent blood pressure reading will call back with reading

## 2022-11-22 LAB — CUP PACEART REMOTE DEVICE CHECK
Date Time Interrogation Session: 20241001230047
Implantable Pulse Generator Implant Date: 20220210

## 2022-11-24 ENCOUNTER — Ambulatory Visit: Payer: 59 | Attending: Cardiovascular Disease | Admitting: Cardiovascular Disease

## 2022-11-24 ENCOUNTER — Encounter: Payer: Self-pay | Admitting: Cardiovascular Disease

## 2022-11-24 VITALS — BP 122/70 | HR 70 | Ht 64.0 in | Wt 153.2 lb

## 2022-11-24 DIAGNOSIS — I4891 Unspecified atrial fibrillation: Secondary | ICD-10-CM

## 2022-11-24 NOTE — Patient Instructions (Addendum)
Medication Instructions:  Continue all current medications.  Labwork: none  Testing/Procedures: none  Follow-Up: 1 year   Any Other Special Instructions Will Be Listed Below (If Applicable).  If you need a refill on your cardiac medications before your next appointment, please call your pharmacy.  

## 2022-11-24 NOTE — Progress Notes (Signed)
Primary Care Physician: Arrie Senate, FNP Primary Cardiologist: Dr Diona Browner Primary Electrophysiologist: Dr Nelly Laurence ( Referring Physician: Dr Nevada Crane [name] is a 82 y.o. female with a history of CAD, HTN, DM, HLD, and persistent atrial fibrillation who presents for follow    The patient was initially diagnosed with atrial fibrillation on 11/2017 after presenting with symptoms of palpitations and chest pain. She has had recurrences and underwent cardioversion. Monitor showed bursts of SVT. She failed amiodarone due to hypothyroidism.  Dr Johney Frame performed AF ablation on 02/16/21. There were multiple atypical atrial flutter circuits noted. Post ablation, she had a R common femoral vein DVT.   she has no device related complaints -- no new tenderness, drainage, redness.  She reports that she has had multiple episodes over the past year that she attributes to atrial fibrillation.  She is occasionally dizzy or lightheaded.  1 episode occurred recently while picking up limbs in her yard.  Review of her loop recorder shows that she has not had any episodes of atrial fibrillation detected since May.     she has a BMI of Body mass index is 26.3 kg/m.Marland Kitchen Filed Weights   11/24/22 0902  Weight: 153 lb 3.2 oz (69.5 kg)    Family History  Problem Relation Age of Onset   Other Mother        "natural causes"   Heart disease Mother    Cancer Father        unsure of type   Colon cancer Daughter        2011     Atrial Fibrillation Management history:  Previous antiarrhythmic drugs: amiodarone  Previous cardioversions: 11/11/18 Previous ablations: 02/16/21 CHADS2VASC score: 6 Anticoagulation history: Eliquis     No Known Allergies      ROS- All systems are reviewed and negative except as per the HPI above.  Physical Exam: Vitals:   11/24/22 0902  BP: 122/70  Pulse: 70  SpO2: 94%  Weight: 153 lb 3.2 oz (69.5 kg)  Height: 5\' 4"  (1.626 m)     GEN- The  patient is a well appearing elderly female, alert and oriented x 3 today.   Lungs- normal work of breathing Heart- Regular rate and rhythm, no murmurs, rubs or gallops    Wt Readings from Last 3 Encounters:  11/24/22 153 lb 3.2 oz (69.5 kg)  08/31/22 154 lb (69.9 kg)  08/15/22 157 lb 3.2 oz (71.3 kg)      Echo 12/11/19 demonstrated   1. Left ventricular ejection fraction, by estimation, is 65 to 70%. The  left ventricle has normal function. The left ventricle has no regional  wall motion abnormalities. There is mild left ventricular hypertrophy.  Left ventricular diastolic parameters are consistent with Grade II diastolic dysfunction (pseudonormalization). Elevated left atrial pressure.   2. Right ventricular systolic function is normal. The right ventricular  size is normal. There is mildly elevated pulmonary artery systolic  pressure.   3. The mitral valve is normal in structure. Mild mitral valve  regurgitation. No evidence of mitral stenosis. Moderate mitral annular calcification.   4. The aortic valve has an indeterminant number of cusps. There is mild calcification of the aortic valve. There is mild thickening of the aortic valve. Aortic valve regurgitation is mild. No aortic stenosis is present.   5. Mild pulmonary HTN, PASP is 36 mmHg.   6. The inferior vena cava is normal in size with greater than 50%  respiratory variability, suggesting right  atrial pressure of 3 mmHg.    Epic records are reviewed at length today  CHA2DS2-VASc Score =    The patient's score is based upon:         ASSESSMENT AND PLAN: 1. Persistent Atrial Fibrillation (ICD10:  I48.19) The patient's CHA2DS2-VASc score is  , indicating a  % annual risk of stroke.   Recall amiodarone d/c 2/2 elevated TSH (stopped 11/11/19) Would avoid class IC with h/o CAD. S/p afib ablation with Dr Johney Frame on 02/16/21 No AF since May, 2024 Continue Eliquis 5 mg BID  Continue diltiazem 240 mg daily Continue Toprol  100 mg daily  Episodes of lightheadedness - I asked her to indicate any symptoms that we can see if there is any associated arrhythmia.  2. Secondary Hypercoagulable State (ICD10:  D68.69) The patient is at significant risk for stroke/thromboembolism based upon her CHA2DS2-VASc Score of  .  Continue Apixaban (Eliquis).   3. CAD No anginal symptoms.  4. HTN Stable, no changes today.  5. Chronic diastolic CHF Fluid status appears stable.    Maurice Small, MD  11/24/2022 9:16 AM

## 2022-11-27 ENCOUNTER — Ambulatory Visit (INDEPENDENT_AMBULATORY_CARE_PROVIDER_SITE_OTHER): Payer: 59

## 2022-11-27 ENCOUNTER — Other Ambulatory Visit: Payer: Self-pay | Admitting: Cardiology

## 2022-11-27 DIAGNOSIS — I4891 Unspecified atrial fibrillation: Secondary | ICD-10-CM | POA: Diagnosis not present

## 2022-11-30 ENCOUNTER — Ambulatory Visit: Payer: 59 | Admitting: Family Medicine

## 2022-11-30 ENCOUNTER — Encounter: Payer: Self-pay | Admitting: Family Medicine

## 2022-11-30 VITALS — BP 167/68 | HR 63 | Temp 98.2°F | Ht 64.0 in | Wt 153.0 lb

## 2022-11-30 DIAGNOSIS — G8929 Other chronic pain: Secondary | ICD-10-CM | POA: Diagnosis not present

## 2022-11-30 DIAGNOSIS — E1159 Type 2 diabetes mellitus with other circulatory complications: Secondary | ICD-10-CM

## 2022-11-30 DIAGNOSIS — F418 Other specified anxiety disorders: Secondary | ICD-10-CM | POA: Diagnosis not present

## 2022-11-30 DIAGNOSIS — E785 Hyperlipidemia, unspecified: Secondary | ICD-10-CM | POA: Diagnosis not present

## 2022-11-30 DIAGNOSIS — I10 Essential (primary) hypertension: Secondary | ICD-10-CM | POA: Diagnosis not present

## 2022-11-30 DIAGNOSIS — Z23 Encounter for immunization: Secondary | ICD-10-CM | POA: Diagnosis not present

## 2022-11-30 DIAGNOSIS — I152 Hypertension secondary to endocrine disorders: Secondary | ICD-10-CM

## 2022-11-30 DIAGNOSIS — E1169 Type 2 diabetes mellitus with other specified complication: Secondary | ICD-10-CM | POA: Diagnosis not present

## 2022-11-30 DIAGNOSIS — M25552 Pain in left hip: Secondary | ICD-10-CM | POA: Diagnosis not present

## 2022-11-30 DIAGNOSIS — M25551 Pain in right hip: Secondary | ICD-10-CM

## 2022-11-30 DIAGNOSIS — R6889 Other general symptoms and signs: Secondary | ICD-10-CM | POA: Diagnosis not present

## 2022-11-30 DIAGNOSIS — F339 Major depressive disorder, recurrent, unspecified: Secondary | ICD-10-CM

## 2022-11-30 DIAGNOSIS — Z79899 Other long term (current) drug therapy: Secondary | ICD-10-CM

## 2022-11-30 LAB — BAYER DCA HB A1C WAIVED: HB A1C (BAYER DCA - WAIVED): 5.6 % (ref 4.8–5.6)

## 2022-11-30 MED ORDER — DULOXETINE HCL 20 MG PO CPEP: 20.0000 mg | ORAL_CAPSULE | Freq: Every day | ORAL | 2 refills | Status: DC

## 2022-11-30 MED ORDER — HYDROCODONE-ACETAMINOPHEN 7.5-325 MG PO TABS: 1.0000 | ORAL_TABLET | Freq: Every day | ORAL | 0 refills | Status: AC | PRN

## 2022-11-30 MED ORDER — HYDROCODONE-ACETAMINOPHEN 7.5-325 MG PO TABS: 1.0000 | ORAL_TABLET | Freq: Every day | ORAL | 0 refills | Status: DC | PRN

## 2022-11-30 NOTE — Progress Notes (Signed)
Subjective:  Patient ID: [name], female    DOB: 06-Jul-1940, 82 y.o.   MRN: [id number]  Patient Care Team: Arrie Senate, FNP as PCP - General (Family Medicine) Jonelle Sidle, MD as PCP - Cardiology (Cardiology) Mealor, Roberts Gaudy, MD as PCP - Electrophysiology (Cardiology) Newman Pies, MD as Consulting Physician (Otolaryngology) Danella Maiers, [facility] (Pharmacist) Dani Gobble, NP as Nurse Practitioner (Endocrinology) Audrie Gallus, RN as [facility] Care Management   Chief Complaint:  Medical Management of Chronic Issues   HPI: [name] is a 82 y.o. female presenting on 11/30/2022 for Medical Management of Chronic Issues  HPI 1. Type 2 diabetes mellitus with other circulatory complication, without long-term current use of insulin (HCC) Glucometer:has one at home, but struggles to prick her finger. States that she has trouble pricking hard enough. States that her granddaughter comes over to help her on occasion, but it has been a long time.  Taking medication(s): tolerating metformin. 500 BID,.  Last eye exam: completed 03/2022 Last foot exam: due December 2024 Last A1c:  Lab Results  Component Value Date   HGBA1C 5.6 11/30/2022   Nephropathy screen indicated?: completed today.  Last flu, zoster and/or pneumovax:  Immunization History  Administered Date(s) Administered   Fluad Quad(high Dose 65+) 11/20/2019, 12/03/2020, 12/16/2021   Influenza Split 12/05/2005, 12/03/2006   Influenza, High Dose Seasonal PF 12/25/2016, 01/07/2018   Influenza,inj,quad, With Preservative 12/03/2015   Influenza-Unspecified 01/30/1995, 12/13/2000, 03/16/2003, 01/21/2015, 12/25/2016, 01/07/2018, 11/21/2018   Moderna Covid-19 Fall Seasonal Vaccine 93yrs & older 11/17/2021   Moderna Covid-19 Vaccine Bivalent Booster 47yrs & up 01/18/2021   Moderna SARS-COV2 Booster Vaccination 12/31/2019, 07/04/2020   Moderna Sars-Covid-2 Vaccination 04/01/2019,  04/29/2019   Pneumococcal Polysaccharide-23 01/30/1995, 12/05/2005, 03/11/2020   Pneumococcal-Unspecified 12/24/2018   Respiratory Syncytial Virus Vaccine,Recomb Aduvanted(Arexvy) 11/17/2021   Tdap 12/24/2018   Zoster Recombinant(Shingrix) 03/04/2021, 06/24/2021    ROS: Denies dizziness, LOC, polyuria, polydipsia, unintended weight loss/gain, foot ulcerations, numbness or tingling in extremities, shortness of breath or chest pain.  2. Primary hypertension Has BP monitor at home Yes BP at home average states that she is not checking it often.  ROS Denies anxiety, fatigue, peripheral edema, changes to vision, chest pain, headaches, palpitations, sweats, SOB, PND, orthopnea Meds Lisinopril states that she took her medications right before she came. States that she was well controlled at MD appt on Monday.  Established with Cardiology CAD risks Diabetes Mellitus, hypertension  3. Chronic hip pain, bilateral Last dose was yesterday. States that she is taking it regularly, sometimes takes 2 in one day, sometimes goes days without it. Reports pain in her hips, lower back, left is worse than right  States that it goes down to both ankles. Denies falls. She is using heat and lidocaine patches. States that Biofreeze works better than Voltaren.    4. Anxiety  States that she has increased stress since her son had to move back in with his two kids. States that son has bipolar disorder and her grandson has Autism (78 years old). In addition, they have a one year old. Feels on edge of tears or screaming.   Relevant past medical, surgical, family, and social history reviewed and updated as indicated.  Allergies and medications reviewed and updated. Data reviewed: Chart in Epic.  Past Medical History:  Diagnosis Date   Anxiety    Arthritis    Bulging lumbar disc    Chronic lower back pain  Coronary atherosclerosis of native coronary artery    DES LAD 02/2008, DES LAD 02/2015, DES ramus and DES x2  mid RCA 02/2020   Depression    Dyslipidemia    Essential hypertension    Facial numbness    Frequent headaches    GERD (gastroesophageal reflux disease)    Glaucoma    Heart attack (HCC) 02/2020   Insomnia    Paroxysmal atrial fibrillation (HCC)    Diagnosed October 2019   PSVT (paroxysmal supraventricular tachycardia) (HCC)    Refusal of blood transfusions as patient is Jehovah's Witness    Thyroid disease    Type 2 diabetes mellitus (HCC)    Borderline   Vitamin D deficiency     Past Surgical History:  Procedure Laterality Date   ATRIAL FIBRILLATION ABLATION N/A 02/16/2021   Procedure: ATRIAL FIBRILLATION ABLATION;  Surgeon: Hillis Range, MD;  Location: [facility] INVASIVE CV LAB;  Service: Cardiovascular;  Laterality: N/A;   BIOPSY  09/21/2021   Procedure: BIOPSY;  Surgeon: Corbin Ade, MD;  Location: [facility] ENDO SU[facility];  Service: Endoscopy;;  gastric   CARDIAC CATHETERIZATION N/A 02/23/2015   Procedure: Left Heart Cath and Coronary Angiography;  Surgeon: Tonny Bollman, MD;  Location: [facility] INVASIVE CV LAB;  Service: Cardiovascular;  Laterality: N/A;   CARDIAC CATHETERIZATION N/A 02/23/2015   Procedure: Coronary Stent Intervention;  Surgeon: Tonny Bollman, MD;  Location: [facility] INVASIVE CV LAB;  Service: Cardiovascular;  Laterality: N/A;   CARDIAC CATHETERIZATION N/A 01/04/2016   Procedure: Left Heart Cath and Coronary Angiography;  Surgeon: Peter M Swaziland, MD;  Location: [facility] INVASIVE CV LAB;  Service: Cardiovascular;  Laterality: N/A;   CATARACT EXTRACTION     CORONARY ANGIOPLASTY  02/23/2015   CORONARY ANGIOPLASTY WITH STENT PLACEMENT  2009   CORONARY STENT INTERVENTION N/A 03/09/2020   Procedure: CORONARY STENT INTERVENTION;  Surgeon: Corky Crafts, MD;  Location: [facility] INVASIVE CV LAB;  Service: Cardiovascular;  Laterality: N/A;   DILATION AND CURETTAGE OF UTERUS     ESOPHAGOGASTRODUODENOSCOPY (EGD) WITH PROPOFOL N/A 09/21/2021   normal esophagus, abnormal gastric mucosa s/p  biopsy, normal duodenum. Pathology with H.pylori. Prescribed Prevpac.   implantable loop recorder placement  04/01/2020   Medtronic Reveal [place] model LNQ 22 [id number]) implantable loop recorder    LEFT HEART CATH AND CORONARY ANGIOGRAPHY N/A 03/09/2020   Procedure: LEFT HEART CATH AND CORONARY ANGIOGRAPHY;  Surgeon: Corky Crafts, MD;  Location: [facility] INVASIVE CV LAB;  Service: Cardiovascular;  Laterality: N/A;   TUBAL LIGATION      Social History   Socioeconomic History   Marital status: Widowed    Spouse name: Not on file   Number of children: 8   Years of education: 10th grade   Highest education level: 10th grade  Occupational History   Occupation: RETIRED  Tobacco Use   Smoking status: Never   Smokeless tobacco: Never   Tobacco comments:    Never smoke 11/15/21  Vaping Use   Vaping status: Never Used  Substance and Sexual Activity   Alcohol use: Not Currently    Alcohol/week: 1.0 standard drink of alcohol    Types: 1 Glasses of wine per week    Comment: RARE OCCASION   Drug use: No   Sexual activity: Not Currently  Other Topics Concern   Not on file  Social History Narrative   Lives at home alone.   Right-handed.   No caffeine use.   Daughter lives next door and helps her out   Social Determinants  of Health   Financial Resource Strain: Low Risk  (05/02/2022)   Overall Financial Resource Strain (CARDIA)    Difficulty of Paying Living Expenses: Not hard at all  Food Insecurity: No Food Insecurity (05/02/2022)   Hunger Vital Sign    Worried About Running Out of Food in the Last Year: Never true    Ran Out of Food in the Last Year: Never true  Transportation Needs: No Transportation Needs (05/02/2022)   PRAPARE - Administrator, Civil Service (Medical): No    Lack of Transportation (Non-Medical): No  Physical Activity: Inactive (05/02/2022)   Exercise Vital Sign    Days of Exercise per Week: 0 days    Minutes of Exercise per Session: 0 min   Stress: No Stress Concern Present (05/02/2022)   [employer] of Occupational Health - Occupational Stress Questionnaire    Feeling of Stress : Not at all  Social Connections: Moderately Isolated (05/02/2022)   Social Connection and Isolation Panel [NHANES]    Frequency of Communication with Friends and Family: More than three times a week    Frequency of Social Gatherings with Friends and Family: More than three times a week    Attends Religious Services: More than 4 times per year    Active Member of [employer] or Organizations: No    Attends Banker Meetings: Never    Marital Status: Widowed  Intimate Partner Violence: Not At Risk (05/02/2022)   Humiliation, Afraid, Rape, and Kick questionnaire    Fear of Current or Ex-Partner: No    Emotionally Abused: No    Physically Abused: No    Sexually Abused: No    Outpatient Encounter Medications as of 11/30/2022  Medication Sig   acetaminophen (TYLENOL) 500 MG tablet Take 1,000 mg by mouth every 6 (six) hours as needed for moderate pain.   Ascorbic Acid (VITAMIN C) 500 MG tablet Take 500 mg by mouth daily.     bimatoprost (LUMIGAN) 0.01 % SOLN Place 1 drop into both eyes at bedtime.   Calcium Carbonate Antacid (TUMS PO) Take 2 tablets by mouth 4 (four) times daily as needed (for acid reflux/indigestion).    Camphor-Menthol-Methyl Sal (SALONPAS) 3.02-26-08 % PTCH Apply 1 patch topically daily as needed (pain).   cetirizine (ZYRTEC) 10 MG tablet Take 10 mg by mouth daily as needed for allergies.   Cyanocobalamin (VITAMIN B 12 PO) Take 1 tablet by mouth daily.   diclofenac Sodium (VOLTAREN) 1 % GEL Apply 2 g topically 4 (four) times daily. (Patient taking differently: Apply 2 g topically 4 (four) times daily as needed (pain).)   diltiazem (CARDIZEM CD) 300 MG 24 hr capsule TAKE ONE CAPSULE BY MOUTH DAILY   ELIQUIS 5 MG TABS tablet TAKE ONE TABLET BY MOUTH TWICE DAILY   GARLIC PO Take 1 tablet by mouth daily.   levothyroxine  (SYNTHROID) 50 MCG tablet Take 1 tablet (50 mcg total) by mouth daily before breakfast.   losartan (COZAAR) 50 MG tablet TAKE ONE (1) TABLET BY MOUTH EVERY DAY   MAGNESIUM CARBONATE PO Take 1 tablet by mouth at bedtime.   metFORMIN (GLUCOPHAGE) 500 MG tablet Take 1 tablet (500 mg total) by mouth 2 (two) times daily with a meal.   metoprolol succinate (TOPROL-XL) 100 MG 24 hr tablet TAKE 1 TABLET BY MOUTH DAILY WITH FOOD   nitroGLYCERIN (NITROSTAT) 0.4 MG SL tablet DISSOLVE 1 TAB UNDER TOUNGE FOR CHEST PAIN. MAY REPEAT EVERY 5 MINUTES FOR 3 DOSES. IF NO RELIEF  CALL 911 OR GO TO ER   rosuvastatin (CRESTOR) 20 MG tablet TAKE ONE (1) TABLET BY MOUTH EVERY DAY   timolol (TIMOPTIC) 0.5 % ophthalmic solution Place 1 drop into both eyes daily.   TURMERIC PO Take 1 tablet by mouth daily.   VENTOLIN HFA 108 (90 Base) MCG/ACT inhaler Inhale 1-2 puffs into the lungs every 4 (four) hours as needed for shortness of breath.   Vitamin D, Cholecalciferol, 10 MCG (400 UNIT) CAPS Take 400 Units by mouth daily.   No facility-administered encounter medications on file as of 11/30/2022.   No Known Allergies  Review of Systems As per HPI Objective:  BP (!) 169/72   Pulse 63   Temp 98.2 F (36.8 C)   Ht 5\' 4"  (1.626 m)   Wt 153 lb (69.4 kg)   LMP  (LMP Unknown)   SpO2 97%   BMI 26.26 kg/m    Wt Readings from Last 3 Encounters:  11/30/22 153 lb (69.4 kg)  11/24/22 153 lb 3.2 oz (69.5 kg)  08/31/22 154 lb (69.9 kg)   Physical Exam Constitutional:      General: She is awake. She is not in acute distress.    Appearance: Normal appearance. She is well-developed and well-groomed. She is not ill-appearing, toxic-appearing or diaphoretic.  Cardiovascular:     Rate and Rhythm: Normal rate.     Pulses: Normal pulses.          Radial pulses are 2+ on the right side and 2+ on the left side.       Posterior tibial pulses are 2+ on the right side and 2+ on the left side.     Heart sounds: Normal heart sounds.  No murmur heard.    No gallop.  Pulmonary:     Effort: Pulmonary effort is normal. No respiratory distress.     Breath sounds: Normal breath sounds. No stridor. No wheezing, rhonchi or rales.  Musculoskeletal:     Cervical back: Full passive range of motion without pain and neck supple.     Right hip: Decreased range of motion.     Left hip: Decreased range of motion.     Right lower leg: No edema.     Left lower leg: No edema.  Skin:    General: Skin is warm.     Capillary Refill: Capillary refill takes less than 2 seconds.  Neurological:     General: No focal deficit present.     Mental Status: She is alert, oriented to person, place, and time and easily aroused. Mental status is at baseline.     GCS: GCS eye subscore is 4. GCS verbal subscore is 5. GCS motor subscore is 6.     Motor: No weakness.  Psychiatric:        Attention and Perception: Attention and perception normal.        Mood and Affect: Mood and affect normal.        Speech: Speech normal.        Behavior: Behavior normal. Behavior is cooperative.        Thought Content: Thought content normal. Thought content does not include homicidal or suicidal ideation. Thought content does not include homicidal or suicidal plan.        Cognition and Memory: Cognition and memory normal.        Judgment: Judgment normal.      Results for orders placed or performed in visit on 11/27/22  CUP PACEART REMOTE DEVICE CHECK  Result Value Ref Range   Date Time Interrogation Session [phone]    Pulse Generator Manufacturer MERM    Pulse Gen Model [id number] Donato Heinz    Pulse Gen Serial Number [id number]    Clinic Name [facility]    Implantable Pulse Generator Type ICM/ILR    Implantable Pulse Generator Implant Date [id number]    Eval Rhythm SR at 60 bpm/PAC        11/30/2022    1:56 PM 08/31/2022    2:27 PM 06/06/2022   11:00 AM 05/09/2022   10:33 AM 05/02/2022   11:39 AM  Depression screen PHQ 2/9  Decreased Interest 1 0 2  1 0  Down, Depressed, Hopeless 1 0 2 1 1   PHQ - 2 Score 2 0 4 2 1   Altered sleeping 1 1 2 2    Tired, decreased energy 2 1 2 2    Change in appetite 1 1 2  0   Feeling bad or failure about yourself   0 1 2   Trouble concentrating 0 1 2 2    Moving slowly or fidgety/restless 0 0 0 0   Suicidal thoughts 0 0 0 0   PHQ-9 Score 6 4 13 10    Difficult doing work/chores  Somewhat difficult Somewhat difficult Somewhat difficult        11/30/2022    1:57 PM 08/31/2022    2:27 PM 06/06/2022   11:00 AM 05/09/2022   10:34 AM  GAD 7 : Generalized Anxiety Score  Nervous, Anxious, on Edge 1 1 1 1   Control/stop worrying 1 1 1 2   Worry too much - different things 1 1 1 2   Trouble relaxing 1 0 1 2  Restless 0 0 0 1  Easily annoyed or irritable 1 1 1 2   Afraid - awful might happen 0 0 0 1  Total GAD 7 Score 5 4 5 11   Anxiety Difficulty  Not difficult at all Somewhat difficult Somewhat difficult    The ASCVD Risk score (Arnett DK, et al., 2019) failed to calculate for the following reasons:   The 2019 ASCVD risk score is only valid for ages 68 to 20   The patient has a prior MI or stroke diagnosis  Pertinent labs & imaging results that were available during my care of the patient were reviewed by me and considered in my medical decision making.  Assessment & Plan:  Tene was seen today for medical management of chronic issues.  Diagnoses and all orders for this visit:  Type 2 diabetes mellitus with other circulatory complication, without long-term current use of insulin (HCC) Well controlled. Reviewed A1C in office with patient. Continue current regimen.  -     Bayer DCA Hb A1c Waived -     CMP14+EGFR -     Vitamin D, 25-hydroxy -     Vitamin B12  Hypertension associated with diabetes (HCC) Not at goal in office. Patient to keep a log of home measurements and bring to follow up with PCP and Endocrinology.  -     CBC with Differential/Platelet  Hyperlipidemia associated with type 2 diabetes  mellitus (HCC) Continue crestor.   Depression, recurrent (HCC) Will start medication as below at low dose due to age. Discussed side effects with patient and daughter. Denies SI.  -     DULoxetine (CYMBALTA) 20 MG capsule; Take 1 capsule (20 mg total) by mouth daily.  Anxiety with depression As above -     DULoxetine (CYMBALTA) 20 MG capsule; Take 1  capsule (20 mg total) by mouth daily.  Chronic hip pain, bilateral Labs as below. Will communicate results to patient once available. Will await results to determine next steps.  Will increase medication as below. Discussed at length side effects of medication.  -     ToxASSURE Select 13 (MW), Urine -     HYDROcodone-acetaminophen (NORCO) 7.5-325 MG tablet; Take 1 tablet by mouth daily as needed for moderate pain. -     HYDROcodone-acetaminophen (NORCO) 7.5-325 MG tablet; Take 1 tablet by mouth daily as needed for moderate pain. -     HYDROcodone-acetaminophen (NORCO) 7.5-325 MG tablet; Take 1 tablet by mouth daily as needed for moderate pain.  Controlled substance agreement signed As above. Controlled substance agreement due in March -     HYDROcodone-acetaminophen (NORCO) 7.5-325 MG tablet; Take 1 tablet by mouth daily as needed for moderate pain. -     HYDROcodone-acetaminophen (NORCO) 7.5-325 MG tablet; Take 1 tablet by mouth daily as needed for moderate pain. -     HYDROcodone-acetaminophen (NORCO) 7.5-325 MG tablet; Take 1 tablet by mouth daily as needed for moderate pain.  Encounter for immunization -     Flu Vaccine Trivalent High Dose (Fluad) -     Pneumococcal conjugate vaccine 20-valent (Prevnar 20)  Continue all other maintenance medications.  Follow up plan: Return in about 3 months (around 03/02/2023) for Chronic Condition Follow up.  Continue healthy lifestyle choices, including diet (rich in fruits, vegetables, and lean proteins, and low in salt and simple carbohydrates) and exercise (at least 30 minutes of moderate  physical activity daily).  Written and verbal instructions provided   The above assessment and management plan was discussed with the patient. The patient verbalized understanding of and has agreed to the management plan. Patient is aware to call the clinic if they develop any new symptoms or if symptoms persist or worsen. Patient is aware when to return to the clinic for a follow-up visit. Patient educated on when it is appropriate to go to the emergency department.   Neale Burly, DNP-FNP [facility] [address] [place], [place] [address] [phone]

## 2022-11-30 NOTE — Addendum Note (Signed)
Addended by: Angela Nevin D on: 11/30/2022 04:48 PM   Modules accepted: Orders

## 2022-12-01 LAB — CMP14+EGFR
ALT: 11 [IU]/L (ref 0–32)
AST: 17 [IU]/L (ref 0–40)
Albumin: 4.4 g/dL (ref 3.7–4.7)
Alkaline Phosphatase: 100 [IU]/L (ref 44–121)
BUN/Creatinine Ratio: 14 (ref 12–28)
BUN: 10 mg/dL (ref 8–27)
Bilirubin Total: 0.3 mg/dL (ref 0.0–1.2)
CO2: 24 mmol/L (ref 20–29)
Calcium: 9.3 mg/dL (ref 8.7–10.3)
Chloride: 100 mmol/L (ref 96–106)
Creatinine, Ser: 0.69 mg/dL (ref 0.57–1.00)
Globulin, Total: 1.9 g/dL (ref 1.5–4.5)
Glucose: 151 mg/dL — ABNORMAL HIGH (ref 70–99)
Potassium: 4.2 mmol/L (ref 3.5–5.2)
Sodium: 141 mmol/L (ref 134–144)
Total Protein: 6.3 g/dL (ref 6.0–8.5)
eGFR: 87 mL/min/{1.73_m2} (ref >59)

## 2022-12-01 LAB — CBC WITH DIFFERENTIAL/PLATELET
Basophils Absolute: 0 10*3/uL (ref 0.0–0.2)
Basos: 0 %
EOS (ABSOLUTE): 0.3 10*3/uL (ref 0.0–0.4)
Eos: 6 %
Hematocrit: 38.3 % (ref 34.0–46.6)
Hemoglobin: 12.7 g/dL (ref 11.1–15.9)
Immature Grans (Abs): 0 10*3/uL (ref 0.0–0.1)
Immature Granulocytes: 0 %
Lymphocytes Absolute: 1.3 10*3/uL (ref 0.7–3.1)
Lymphs: 25 %
MCH: 30.3 pg (ref 26.6–33.0)
MCHC: 33.2 g/dL (ref 31.5–35.7)
MCV: 91 fL (ref 79–97)
Monocytes Absolute: 0.4 10*3/uL (ref 0.1–0.9)
Monocytes: 7 %
Neutrophils Absolute: 3.1 10*3/uL (ref 1.4–7.0)
Neutrophils: 62 %
Platelets: 185 10*3/uL (ref 150–450)
RBC: 4.19 x10E6/uL (ref 3.77–5.28)
RDW: 12 % (ref 11.7–15.4)
WBC: 5.1 10*3/uL (ref 3.4–10.8)

## 2022-12-01 LAB — MICROALBUMIN / CREATININE URINE RATIO
Creatinine, Urine: 52.1 mg/dL
Microalb/Creat Ratio: 6 mg/g{creat} (ref 0–29)
Microalbumin, Urine: 3.3 ug/mL

## 2022-12-01 LAB — VITAMIN B12: Vitamin B-12: >2000 pg/mL — ABNORMAL HIGH (ref 232–1245)

## 2022-12-01 LAB — VITAMIN D 25 HYDROXY (VIT D DEFICIENCY, FRACTURES): Vit D, 25-Hydroxy: 76.1 ng/mL (ref 30.0–100.0)

## 2022-12-05 LAB — TOXASSURE SELECT 13 (MW), URINE

## 2022-12-11 ENCOUNTER — Other Ambulatory Visit: Payer: Self-pay | Admitting: Family Medicine

## 2022-12-13 NOTE — Progress Notes (Signed)
Carelink Summary Report / Loop Recorder 

## 2022-12-15 ENCOUNTER — Other Ambulatory Visit (HOSPITAL_COMMUNITY)
Admission: RE | Admit: 2022-12-15 | Discharge: 2022-12-15 | Disposition: A | Discharge status: Home / Self Care | Payer: 59 | Source: Ambulatory Visit | Attending: Nurse Practitioner | Admitting: Nurse Practitioner

## 2022-12-15 DIAGNOSIS — E032 Hypothyroidism due to medicaments and other exogenous substances: Secondary | ICD-10-CM | POA: Diagnosis not present

## 2022-12-15 LAB — T4, FREE: Free T4: 1.35 ng/dL — ABNORMAL HIGH (ref 0.61–1.12)

## 2022-12-15 LAB — TSH: TSH: 0.646 u[IU]/mL (ref 0.350–4.500)

## 2022-12-19 ENCOUNTER — Encounter: Payer: Self-pay | Admitting: Nurse Practitioner

## 2022-12-19 ENCOUNTER — Ambulatory Visit (INDEPENDENT_AMBULATORY_CARE_PROVIDER_SITE_OTHER): Payer: 59 | Admitting: Nurse Practitioner

## 2022-12-19 VITALS — BP 140/74 | HR 64 | Ht 64.0 in | Wt 150.0 lb

## 2022-12-19 DIAGNOSIS — E042 Nontoxic multinodular goiter: Secondary | ICD-10-CM

## 2022-12-19 DIAGNOSIS — E032 Hypothyroidism due to medicaments and other exogenous substances: Secondary | ICD-10-CM

## 2022-12-19 MED ORDER — LEVOTHYROXINE SODIUM 25 MCG PO TABS
25.0000 ug | ORAL_TABLET | Freq: Every day | ORAL | 1 refills | Status: DC
Start: 2022-12-19 — End: 2023-04-05

## 2022-12-19 NOTE — Patient Instructions (Signed)

## 2022-12-19 NOTE — Progress Notes (Signed)
12/19/2022, 10:50 AM                          Endocrinology Follow Up Visit   Subjective:    Patient ID: [name], female    DOB: 1940/09/20, PCP Arrie Senate, FNP   Past Medical History:  Diagnosis Date   Anxiety    Arthritis    Bulging lumbar disc    Chronic lower back pain    Coronary atherosclerosis of native coronary artery    DES LAD 02/2008, DES LAD 02/2015, DES ramus and DES x2 mid RCA 02/2020   Depression    Dyslipidemia    Essential hypertension    Facial numbness    Frequent headaches    GERD (gastroesophageal reflux disease)    Glaucoma    Heart attack (HCC) 02/2020   Insomnia    Paroxysmal atrial fibrillation (HCC)    Diagnosed October 2019   PSVT (paroxysmal supraventricular tachycardia) (HCC)    Refusal of blood transfusions as patient is Jehovah's Witness    Thyroid disease    Type 2 diabetes mellitus (HCC)    Borderline   Vitamin D deficiency    Past Surgical History:  Procedure Laterality Date   ATRIAL FIBRILLATION ABLATION N/A 02/16/2021   Procedure: ATRIAL FIBRILLATION ABLATION;  Surgeon: Hillis Range, MD;  Location: [facility] INVASIVE CV LAB;  Service: Cardiovascular;  Laterality: N/A;   BIOPSY  09/21/2021   Procedure: BIOPSY;  Surgeon: Corbin Ade, MD;  Location: [facility] ENDO SU[facility];  Service: Endoscopy;;  gastric   CARDIAC CATHETERIZATION N/A 02/23/2015   Procedure: Left Heart Cath and Coronary Angiography;  Surgeon: Tonny Bollman, MD;  Location: [facility] INVASIVE CV LAB;  Service: Cardiovascular;  Laterality: N/A;   CARDIAC CATHETERIZATION N/A 02/23/2015   Procedure: Coronary Stent Intervention;  Surgeon: Tonny Bollman, MD;  Location: [facility] INVASIVE CV LAB;  Service: Cardiovascular;  Laterality: N/A;   CARDIAC CATHETERIZATION N/A 01/04/2016   Procedure: Left Heart Cath and Coronary Angiography;  Surgeon: Peter M Swaziland, MD;  Location: [facility] INVASIVE CV LAB;  Service: Cardiovascular;  Laterality:  N/A;   CATARACT EXTRACTION     CORONARY ANGIOPLASTY  02/23/2015   CORONARY ANGIOPLASTY WITH STENT PLACEMENT  2009   CORONARY STENT INTERVENTION N/A 03/09/2020   Procedure: CORONARY STENT INTERVENTION;  Surgeon: Corky Crafts, MD;  Location: [facility] INVASIVE CV LAB;  Service: Cardiovascular;  Laterality: N/A;   DILATION AND CURETTAGE OF UTERUS     ESOPHAGOGASTRODUODENOSCOPY (EGD) WITH PROPOFOL N/A 09/21/2021   normal esophagus, abnormal gastric mucosa s/p biopsy, normal duodenum. Pathology with H.pylori. Prescribed Prevpac.   implantable loop recorder placement  04/01/2020   Medtronic Reveal Bliss model LNQ [id number]) implantable loop recorder    LEFT HEART CATH AND CORONARY ANGIOGRAPHY N/A 03/09/2020   Procedure: LEFT HEART CATH AND CORONARY ANGIOGRAPHY;  Surgeon: Corky Crafts, MD;  Location: [facility] INVASIVE CV LAB;  Service: Cardiovascular;  Laterality: N/A;   TUBAL LIGATION     Social History   Socioeconomic History   Marital status: Widowed    Spouse name: Not on file   Number of children: 8   Years of education: 10th grade   Highest education level: 10th grade  Occupational History   Occupation: RETIRED  Tobacco Use   Smoking status: Never   Smokeless tobacco: Never   Tobacco comments:    Never smoke 11/15/21  Vaping Use   Vaping status: Never Used  Substance and Sexual Activity   Alcohol use: Not Currently    Alcohol/week: 1.0 standard drink of alcohol    Types: 1 Glasses of wine per week    Comment: RARE OCCASION   Drug use: No   Sexual activity: Not Currently  Other Topics Concern   Not on file  Social History Narrative   Lives at home alone.   Right-handed.   No caffeine use.   Daughter lives next door and helps her out   Social Determinants of Health   Financial Resource Strain: Low Risk  (05/02/2022)   Overall Financial Resource Strain (CARDIA)    Difficulty of Paying Living Expenses: Not hard at all  Food Insecurity: No Food Insecurity  (05/02/2022)   Hunger Vital Sign    Worried About Running Out of Food in the Last Year: Never true    Ran Out of Food in the Last Year: Never true  Transportation Needs: No Transportation Needs (05/02/2022)   PRAPARE - Administrator, Civil Service (Medical): No    Lack of Transportation (Non-Medical): No  Physical Activity: Inactive (05/02/2022)   Exercise Vital Sign    Days of Exercise per Week: 0 days    Minutes of Exercise per Session: 0 min  Stress: No Stress Concern Present (05/02/2022)   [employer] of Occupational Health - Occupational Stress Questionnaire    Feeling of Stress : Not at all  Social Connections: Moderately Isolated (05/02/2022)   Social Connection and Isolation Panel [NHANES]    Frequency of Communication with Friends and Family: More than three times a week    Frequency of Social Gatherings with Friends and Family: More than three times a week    Attends Religious Services: More than 4 times per year    Active Member of [employer] or Organizations: No    Attends Banker Meetings: Never    Marital Status: Widowed   Family History  Problem Relation Age of Onset   Other Mother        "natural causes"   Heart disease Mother    Cancer Father        unsure of type   Colon cancer Daughter        2011   Outpatient Encounter Medications as of 12/19/2022  Medication Sig   acetaminophen (TYLENOL) 500 MG tablet Take 1,000 mg by mouth every 6 (six) hours as needed for moderate pain.   Ascorbic Acid (VITAMIN C) 500 MG tablet Take 500 mg by mouth daily.     bimatoprost (LUMIGAN) 0.01 % SOLN Place 1 drop into both eyes at bedtime.   Calcium Carbonate Antacid (TUMS PO) Take 2 tablets by mouth 4 (four) times daily as needed (for acid reflux/indigestion).    Camphor-Menthol-Methyl Sal (SALONPAS) 3.02-26-08 % PTCH Apply 1 patch topically daily as needed (pain).   cetirizine (ZYRTEC) 10 MG tablet Take 10 mg by mouth daily as needed for allergies.    Cyanocobalamin (VITAMIN B 12 PO) Take 1 tablet by mouth daily.   diclofenac Sodium (VOLTAREN) 1 % GEL Apply 2 g topically 4 (four) times daily. (Patient taking differently: Apply 2 g topically 4 (four) times daily as needed (pain).)   diltiazem (CARDIZEM CD) 300 MG 24 hr capsule TAKE ONE CAPSULE BY  MOUTH DAILY   DULoxetine (CYMBALTA) 20 MG capsule Take 1 capsule (20 mg total) by mouth daily.   ELIQUIS 5 MG TABS tablet TAKE ONE TABLET BY MOUTH TWICE DAILY   GARLIC PO Take 1 tablet by mouth daily.   HYDROcodone-acetaminophen (NORCO) 7.5-325 MG tablet Take 1 tablet by mouth daily as needed for moderate pain.   [START ON 01/05/2023] HYDROcodone-acetaminophen (NORCO) 7.5-325 MG tablet Take 1 tablet by mouth daily as needed for moderate pain.   [START ON 02/04/2023] HYDROcodone-acetaminophen (NORCO) 7.5-325 MG tablet Take 1 tablet by mouth daily as needed for moderate pain.   losartan (COZAAR) 50 MG tablet TAKE ONE (1) TABLET BY MOUTH EVERY DAY   MAGNESIUM CARBONATE PO Take 1 tablet by mouth at bedtime.   metFORMIN (GLUCOPHAGE) 500 MG tablet Take 1 tablet (500 mg total) by mouth 2 (two) times daily with a meal.   metoprolol succinate (TOPROL-XL) 100 MG 24 hr tablet TAKE 1 TABLET BY MOUTH DAILY WITH FOOD   nitroGLYCERIN (NITROSTAT) 0.4 MG SL tablet DISSOLVE 1 TAB UNDER TOUNGE FOR CHEST PAIN. MAY REPEAT EVERY 5 MINUTES FOR 3 DOSES. IF NO RELIEF CALL 911 OR GO TO ER   rosuvastatin (CRESTOR) 20 MG tablet TAKE ONE (1) TABLET BY MOUTH EVERY DAY   timolol (TIMOPTIC) 0.5 % ophthalmic solution Place 1 drop into both eyes daily.   TURMERIC PO Take 1 tablet by mouth daily.   VENTOLIN HFA 108 (90 Base) MCG/ACT inhaler Inhale 1-2 puffs into the lungs every 4 (four) hours as needed for shortness of breath.   Vitamin D, Cholecalciferol, 10 MCG (400 UNIT) CAPS Take 400 Units by mouth daily.   [DISCONTINUED] levothyroxine (SYNTHROID) 50 MCG tablet Take 1 tablet (50 mcg total) by mouth daily before breakfast.    levothyroxine (SYNTHROID) 25 MCG tablet Take 1 tablet (25 mcg total) by mouth daily before breakfast.   No facility-administered encounter medications on file as of 12/19/2022.   ALLERGIES: No Known Allergies  VACCINATION STATUS: Immunization History  Administered Date(s) Administered   Fluad Quad(high Dose 65+) 11/20/2019, 12/03/2020, 12/16/2021   Fluad Trivalent(High Dose 65+) 11/30/2022   Influenza Split 12/05/2005, 12/03/2006   Influenza, High Dose Seasonal PF 12/25/2016, 01/07/2018   Influenza,inj,quad, With Preservative 12/03/2015   Influenza-Unspecified 01/30/1995, 12/13/2000, 03/16/2003, 01/21/2015, 12/25/2016, 01/07/2018, 11/21/2018   Moderna Covid-19 Fall Seasonal Vaccine 72yrs & older 11/17/2021   Moderna Covid-19 Vaccine Bivalent Booster 28yrs & up 01/18/2021   Moderna SARS-COV2 Booster Vaccination 12/31/2019, 07/04/2020   Moderna Sars-Covid-2 Vaccination 04/01/2019, 04/29/2019   PNEUMOCOCCAL CONJUGATE-20 11/30/2022   Pneumococcal Polysaccharide-23 01/30/1995, 12/05/2005, 03/11/2020   Pneumococcal-Unspecified 12/24/2018   Respiratory Syncytial Virus Vaccine,Recomb Aduvanted(Arexvy) 11/17/2021   Tdap 12/24/2018   Zoster Recombinant(Shingrix) 03/04/2021, 06/24/2021    Thyroid Problem Presents for follow-up visit. Symptoms include anxiety, diarrhea, fatigue, palpitations (having cardiac ablation soon) and weight loss. Patient reports no cold intolerance, constipation, depressed mood, heat intolerance, tremors or weight gain. The symptoms have been worsening.    [name] is 82 y.o. female who is being engaged in follow-up after she was seen in consultation for hypothyroidism.    PCP:  Arrie Senate, FNP.   Patient is assisted by her daughter during visit.   Her labs confirm primary hypothyroidism. Of note, she was treated with amiodarone for almost a year due to cardiac dysrhythmia .  Patient denies any family history of thyroid dysfunction, except for her  sister who had nodules on her thyroid requiring biopsy which were benign.  She also  had suspicious nodules on her thyroid requiring biopsy.  All came back benign, even the one that needed to be [facility] to [facility] for confirmation.  She reports weight gain, fatigue, cold intolerance, constipation.   Review of systems  Constitutional: + decreasing body weight- per patient and daughter report,  current Body mass index is 25.75 kg/m. , + fatigue, no subjective hyperthermia, no subjective hypothermia Eyes: no blurry vision, no xerophthalmia ENT: no sore throat, no nodules palpated in throat, no dysphagia/odynophagia, no hoarseness, metallic taste in mouth- says feels like blisters Cardiovascular: no chest pain, no shortness of breath, + intermittent palpitations, no leg swelling Respiratory: no cough, no shortness of breath Gastrointestinal: no nausea/vomiting, intermittent diarrhea, Musculoskeletal: no muscle/joint aches Skin: no rashes, no hyperemia Neurological: no tremors, no numbness, no tingling, no dizziness Psychiatric: no depression, + anxiety (recently started on new med by PCP)  Objective:    BP (!) 140/74 (BP Location: Right Arm, Patient Position: Sitting, Cuff Size: Large)   Pulse 64   Ht 5\' 4"  (1.626 m)   Wt 150 lb (68 kg)   LMP  (LMP Unknown)   BMI 25.75 kg/m   Wt Readings from Last 3 Encounters:  12/19/22 150 lb (68 kg)  11/30/22 153 lb (69.4 kg)  11/24/22 153 lb 3.2 oz (69.5 kg)    BP Readings from Last 3 Encounters:  12/19/22 (!) 140/74  11/30/22 (!) 167/68  11/24/22 122/70     Physical Exam- Limited  Constitutional:  Body mass index is 25.75 kg/m. , not in acute distress, normal state of mind Eyes:  EOMI, no exophthalmos Neck: Supple Cardiovascular: irregular rhythm- every 3rd beat has an extra beat, no murmurs, rubs, or gallops, no edema Musculoskeletal: no gross deformities, strength intact in all four extremities, no gross restriction of joint  movements Skin:  no rashes, no hyperemia Neurological: no tremor with outstretched hands    CMP ( most recent) CMP     Component Value Date/Time   NA 141 11/30/2022 1411   K 4.2 11/30/2022 1411   CL 100 11/30/2022 1411   CO2 24 11/30/2022 1411   GLUCOSE 151 (H) 11/30/2022 1411   GLUCOSE 162 (H) 12/14/2021 1341   BUN 10 11/30/2022 1411   CREATININE 0.69 11/30/2022 1411   CALCIUM 9.3 11/30/2022 1411   PROT 6.3 11/30/2022 1411   ALBUMIN 4.4 11/30/2022 1411   AST 17 11/30/2022 1411   ALT 11 11/30/2022 1411   ALKPHOS 100 11/30/2022 1411   BILITOT 0.3 11/30/2022 1411   GFRNONAA >60 12/14/2021 1341   GFRAA >60 10/21/2019 1347     Diabetic Labs (most recent): Lab Results  Component Value Date   HGBA1C 5.6 11/30/2022   HGBA1C 8.2 (H) 04/20/2022   HGBA1C 7.4 (H) 01/24/2022     Lipid Panel ( most recent) Lipid Panel     Component Value Date/Time   CHOL 109 01/24/2022 1026   TRIG 87 01/24/2022 1026   HDL 41 01/24/2022 1026   CHOLHDL 2.7 01/24/2022 1026   CHOLHDL 2.4 03/09/2020 0209   VLDL 12 03/09/2020 0209   LDLCALC 51 01/24/2022 1026   LABVLDL 17 01/24/2022 1026      Lab Results  Component Value Date   TSH 0.646 12/15/2022   TSH 0.462 08/10/2022   TSH 0.529 07/26/2021   TSH 0.232 (L) 02/07/2021   TSH 1.995 01/10/2021   TSH 0.552 08/27/2020   TSH 0.745 08/06/2020   TSH 6.335 (H) 05/05/2020   TSH 10.867 (H) 03/03/2020  TSH 20.523 (H) 02/03/2020   FREET4 1.35 (H) 12/15/2022   FREET4 1.18 (H) 08/10/2022   FREET4 1.06 07/26/2021   FREET4 1.37 (H) 02/07/2021   FREET4 1.26 (H) 08/06/2020   FREET4 0.95 05/05/2020   FREET4 0.65 03/03/2020   FREET4 0.44 (L) 02/03/2020     Thyroid US on 04/29/20 CLINICAL DATA:  Hyperthyroidism   EXAM: THYROID ULTRASOUND   TECHNIQUE: Ultrasound examination of the thyroid gland and adjacent soft tissues was performed.   COMPARISON:  None.   FINDINGS: Parenchymal Echotexture: Mildly heterogeneous   Isthmus: 0.7 cm    Right lobe: 6.2 x 2.6 x 2.2 cm   Left lobe: 5.0 x 2.1 x 2.2 cm   _________________________________________________________   Estimated total number of nodules >/= 1 cm: 7   Number of spongiform nodules >/=  2 cm not described below (TR1): 0   Number of mixed cystic and solid nodules >/= 1.5 cm not described below (TR2): 0   _________________________________________________________   Nodule # 1:   Location: Right; superior   Maximum size: 1.3 cm; Other 2 dimensions: 1.3 x 1.0 cm   Composition: solid/almost completely solid (2)   Echogenicity: hypoechoic (2)   Shape: taller-than-wide (3)   Margins: smooth (0)   Echogenic foci: none (0)   ACR TI-RADS total points: 7.   ACR TI-RADS risk category: TR5 (>/= 7 points).   ACR TI-RADS recommendations:   **Given size (>/= 1.0 cm) and appearance, fine needle aspiration of this highly suspicious nodule should be considered based on TI-RADS criteria.   _________________________________________________________   Nodule # 2:   Location: Right; superior   Maximum size: 2.2 cm; Other 2 dimensions: 1.7 x 1.7 cm   Composition: solid/almost completely solid (2)   Echogenicity: isoechoic (1)   Shape: taller-than-wide (3)   Margins: smooth (0)   Echogenic foci: none (0)   ACR TI-RADS total points: 4.   ACR TI-RADS risk category: TR4 (4-6 points).   ACR TI-RADS recommendations:   **Given size (>/= 1.5 cm) and appearance, fine needle aspiration of this moderately suspicious nodule should be considered based on TI-RADS criteria.   _________________________________________________________   Nodule # 3:   Location: Right; Mid   Maximum size: 2.3 cm; Other 2 dimensions: 1.7 x 2.2 cm   Composition: solid/almost completely solid (2)   Echogenicity: hypoechoic (2)   Shape: not taller-than-wide (0)   Margins: smooth (0)   Echogenic foci: none (0)   ACR TI-RADS total points: 4.   ACR TI-RADS risk category:  TR4 (4-6 points).   ACR TI-RADS recommendations:   **Given size (>/= 1.5 cm) and appearance, fine needle aspiration of this moderately suspicious nodule should be considered based on TI-RADS criteria.   _________________________________________________________   Nodule # 4:   Location: Right; Inferior   Maximum size: 1.6 cm; Other 2 dimensions: 1.0 x 1.2 cm   Composition: mixed cystic and solid (1)   Echogenicity: isoechoic (1)   Shape: not taller-than-wide (0)   Margins: smooth (0)   Echogenic foci: none (0)   ACR TI-RADS total points: 2.   ACR TI-RADS risk category: TR2 (2 points).   ACR TI-RADS recommendations:   This nodule does NOT meet TI-RADS criteria for biopsy or dedicated follow-up.   _________________________________________________________   Nodule # 5:   Location: Left; superior   Maximum size: 1.8 cm; Other 2 dimensions: 1.3 x 1.8 cm   Composition: solid/almost completely solid (2)   Echogenicity: isoechoic (1)   Shape: not taller-than-wide (0)  Margins: smooth (0)   Echogenic foci: none (0)   ACR TI-RADS total points: 3.   ACR TI-RADS risk category: TR3 (3 points).   ACR TI-RADS recommendations:   *Given size (>/= 1.5 - 2.4 cm) and appearance, a follow-up ultrasound in 1 year should be considered based on TI-RADS criteria.   _________________________________________________________   Nodule # 6:   Location: Left; mid   Maximum size: 1.9 cm; Other 2 dimensions: 1.7 x 1.8 cm   Composition: solid/almost completely solid (2)   Echogenicity: isoechoic (1)   Shape: taller-than-wide (3)   Margins: ill-defined (0)   Echogenic foci: none (0)   ACR TI-RADS total points: 6.   ACR TI-RADS risk category: TR4 (4-6 points).   ACR TI-RADS recommendations:   **Given size (>/= 1.5 cm) and appearance, fine needle aspiration of this moderately suspicious nodule should be considered based on TI-RADS criteria.    _________________________________________________________   Nodule # 7: 1.1 cm cystic nodule in the inferior left thyroid lobe does not meet criteria for FNA or imaging follow-up.   IMPRESSION: 1. Nodule 1 (TI-RADS 5) located in the superior right thyroid lobe, measuring 1.3 x 1.3 x 1.0 cm, meets criteria for FNA. 2. Nodule 2 (TI-RADS 4), located in the superior right thyroid lobe, measuring 2.2 x 1.7 x 1.7 cm, meets criteria for FNA. 3. Nodule 3 (TI-RADS 4) located in the mid right thyroid lobe, measuring 2.3 x 1.7 x 2.2 cm, meets criteria for FNA. 4. Nodule 5 (TI-RADS 3) located in the superior left thyroid lobe, measuring 1.8 x 1.3 x 1.8 cm, meets criteria for imaging follow-up. Next ultrasound should be performed in 1 year. 5. Nodule 6 (TI-RADS 4), located in the mid left thyroid lobe, measuring 1.9 x 1.7 x 1.8 cm, meets criteria for FNA. 6. Order of suspicion of nodules for consideration for FNA: 1, 3, 2, 6   The above is in keeping with the ACR TI-RADS recommendations - J Am Coll Radiol 2017;14:587-595.     Electronically Signed   By: Acquanetta Belling M.D.   On: 04/29/2020 16:12 -------------------------------------------------------------------------------------------------------------------------- Cytology - Non PAP  CASE: [id number]  PATIENT: Bethlehem Kady  Non-Gynecological Cytology Report    FNA Thyroid Nodule Biopsy  Clinical History: 2.3 cm RML  Specimen Submitted:  A. THYROID, RML, FINE NEEDLE ASPIRATION:    FINAL MICROSCOPIC DIAGNOSIS:  - Atypia of undetermined significance (Bethesda category III)   SPECIMEN ADEQUACY:  Satisfactory for evaluation   DIAGNOSTIC COMMENTS:  This specimen will be sent for Afirma testing.   GROSS:  Received is/are 3 slides in 95% Ethyl alcohol, 3 air dried slides for  diff stain, and 30 ccs of pale pink Cytolyt solution. (CM:cm)  Prepared:  Smears:  6  Concentration Method (Thin Prep):  1  Cell Block:  Cell block  attempted, not obtained.  Additional Studies:  Also there was an Afirma collected. For RUL Thyroid  FNA, see [id number].   CYTOLOGY - NON PAP  CASE: [id number]  PATIENT: Yeraldi Revelle  Non-Gynecological Cytology Report          FNA Thyroid Nodule Biopsy  Clinical History: 1.3 cm RUL  Specimen Submitted:  A. THYROID, RUL, FINE NEEDLE ASPIRATION:    FINAL MICROSCOPIC DIAGNOSIS:  - Consistent with benign follicular nodule (Bethesda category II)   SPECIMEN ADEQUACY:  Satisfactory for evaluation   GROSS:  Received is/are 4 slides in 95% Ethyl alcohol, 4 air dried slides for  diff stain, and 30 ccs of pale pink Cytolyt solution. (  CM:cm)  Prepared:  Smears:  8  Concentration Method (Thin Prep):  1  Cell Block:  Cell block attempted, not obtained.  Additional Studies:  Also there was an Afirma collected. For RML Thyroid  FNA, see [id number].    Repeat thyroid US from 07/26/21 CLINICAL DATA:  Goiter. Multifocal thyroid ultrasound. Patient previously underwent biopsy of right superior and right mid thyroid nodules in March of 2022   EXAM: THYROID ULTRASOUND   TECHNIQUE: Ultrasound examination of the thyroid gland and adjacent soft tissues was performed.   COMPARISON:  Prior thyroid ultrasound 04/29/2020   FINDINGS: Parenchymal Echotexture: Markedly heterogenous   Isthmus: 0.5 cm   Right lobe: 4.5 x 2.1 x 2.4 cm   Left lobe: 4.5 x 2.0 x 2.1 cm   _________________________________________________________   Estimated total number of nodules >/= 1 cm: 5   Number of spongiform nodules >/=  2 cm not described below (TR1): 0   Number of mixed cystic and solid nodules >/= 1.5 cm not described below (TR2): 0   _________________________________________________________   The thyroid gland is diffusely heterogeneous, enlarged and nodular. Many of the areas of "nodularity "are inseparable from the background thyroid parenchyma and most likely to represent areas  of pseudo nodularity.   Nodule # 1: The previously biopsied nodule/cluster of nodules in the right superior gland demonstrates no significant interval change compared to prior imaging obtained at the time of biopsy.   Nodule # 3: The previously biopsied nodule with central dystrophic calcifications in the right mid to lower gland measures 1.9 x 1.7 x 1.2 cm; slightly smaller than 2.3 by 1.7 x 2.2 cm previously.   Additional measured areas either represent benign-appearing spongiform nodules, or vague regions of pseudo nodularity. No other discrete nodules identified that would warrant further evaluation.   IMPRESSION: Overall, unchanged appearance of the thyroid gland which is diffusely enlarged, heterogeneous and lobular with multiple regions of pseudonodularity.   The previously biopsied nodules in the right upper and right mid gland demonstrate no significant interval change.   No additional foci of definitive nodularity that would warrant biopsy or dedicated imaging surveillance.     Electronically Signed   By: Malachy Moan M.D.   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  She is advised to lower her dose of Levothyroxine to 25 mcg po daily before breakfast.    - We discussed about the correct intake of her thyroid hormone, on empty stomach at fasting, with water, separated by at least 30 minutes from breakfast and other medications,  and separated by more than 4 hours from calcium, iron, multivitamins, acid reflux medications  (PPIs). -Patient is made aware of the fact that thyroid hormone replacement is needed for life, dose to be adjusted by periodic monitoring of thyroid function tests.   2. Multinodular Goiter -Her thyroid ultrasound shows multinodular goiter with 4 moderately suspicious nodules that meet criteria for FNA and 1 nodule recommending follow up with ultrasound in 1 year.   All biopsies were confirmed to be benign.   Her follow up ultrasound  shows stable MNG, no further surveillance is needed at this time.    - she is advised to maintain close follow up with Milian, Aleen Campi, FNP for primary care needs.       I spent  16  minutes in the care of the patient today including review of labs from Thyroid Function, CMP, and other relevant labs ; imaging/biopsy records (current and previous including abstractions from other facilities); face-to-face time discussing  her lab results and symptoms, medications doses, her options of short and long term treatment based on the latest standards of care / guidelines;   and documenting the encounter.  [name]  participated in the discussions, expressed understanding, and voiced agreement with the above plans.  All questions were answered to her satisfaction. she is encouraged to contact clinic should she have any questions or concerns prior to her return visit.   Follow up plan: Return in about 3 months (around 03/21/2023) for Thyroid follow up, Previsit labs.  Ronny Bacon, [facility] [address] [place], [place] [address] Phone: [phone] Fax: [phone]   12/19/2022, 10:50 AM

## 2022-12-22 ENCOUNTER — Encounter: Payer: Self-pay | Admitting: *Deleted

## 2022-12-26 ENCOUNTER — Ambulatory Visit: Payer: 59 | Attending: Cardiology | Admitting: Cardiology

## 2022-12-26 ENCOUNTER — Ambulatory Visit: Payer: 59 | Admitting: Cardiology

## 2022-12-26 ENCOUNTER — Encounter: Payer: Self-pay | Admitting: Cardiology

## 2022-12-26 VITALS — BP 122/58 | HR 65 | Ht 64.0 in | Wt 151.6 lb

## 2022-12-26 DIAGNOSIS — I10 Essential (primary) hypertension: Secondary | ICD-10-CM

## 2022-12-26 DIAGNOSIS — E782 Mixed hyperlipidemia: Secondary | ICD-10-CM | POA: Diagnosis not present

## 2022-12-26 DIAGNOSIS — I480 Paroxysmal atrial fibrillation: Secondary | ICD-10-CM | POA: Diagnosis not present

## 2022-12-26 DIAGNOSIS — I25119 Atherosclerotic heart disease of native coronary artery with unspecified angina pectoris: Secondary | ICD-10-CM

## 2022-12-26 NOTE — Progress Notes (Signed)
    Cardiology Office Note  Date: 12/26/2022   ID: LI BOBO, DOB [birth year], MRN [id number]  History of Present Illness: [name] is an 82 y.o. female last seen in May.  She is here today with her daughter for follow-up visit.  She does report intermittent sense of palpitations and lightheadedness, but this has not clearly correlated with atrial fibrillation based on ILR interrogation.  No definite angina.  ILR in place with follow-up by Dr. Nelly Laurence.  Device interrogations have shown no new atrial fibrillation since May of this year.  I reviewed her medications.  Current cardiovascular regimen includes Cardizem CD, Eliquis, Cozaar, Toprol XL, Crestor, and as needed nitroglycerin.  She reports compliance with therapy.  Blood pressure initially elevated, rechecked by me at 122/58.  Physical Exam: VS:  BP (!) 122/58 (BP Location: Left Arm)   Pulse 65   Ht 5\' 4"  (1.626 m)   Wt 151 lb 9.6 oz (68.8 kg)   LMP  (LMP Unknown)   SpO2 96%   BMI 26.02 kg/m , BMI Body mass index is 26.02 kg/m.  Wt Readings from Last 3 Encounters:  12/26/22 151 lb 9.6 oz (68.8 kg)  12/19/22 150 lb (68 kg)  11/30/22 153 lb (69.4 kg)    General: Patient appears comfortable at rest. HEENT: Conjunctiva and lids normal. Neck: Supple, no elevated JVP or carotid bruits. Lungs: Clear to auscultation, nonlabored breathing at rest. Cardiac: Regular rate and rhythm, no S3, 1/6 systolic murmur. Extremities: No pitting edema.  ECG:  An ECG dated 07/05/2022 was personally reviewed today and demonstrated:  Sinus rhythm with PACs.  Labwork: 11/30/2022: ALT 11; AST 17; BUN 10; Creatinine, Ser 0.69; Hemoglobin 12.7; Platelets 185; Potassium 4.2; Sodium 141 12/15/2022: TSH 0.646     Component Value Date/Time   CHOL 109 01/24/2022 1026   TRIG 87 01/24/2022 1026   HDL 41 01/24/2022 1026   CHOLHDL 2.7 01/24/2022 1026   CHOLHDL 2.4 03/09/2020 0209   VLDL 12 03/09/2020 0209   LDLCALC 51 01/24/2022 1026    Other Studies Reviewed Today:  No interval cardiac testing for review today.  Assessment and Plan:  1.  CAD status post DES to the LAD in 2010, DES to the LAD in 2017, and DES to the ramus intermedius as well as DES x 2 to the mid RCA in 2022.  LVEF 55 to 60% by echocardiogram in November 2023.  She reports no progressive angina.  Continue Crestor and as needed nitroglycerin.   2.  Persistent atrial fibrillation with CHA2DS2-VASc score of 6 status post atrial fibrillation ablation by Dr. Johney Frame in December 2022.  Previously taken off of amiodarone due to hypothyroidism.  No atrial fibrillation documented by ILR since May.  Continue Cardizem CD, Toprol-XL, and Eliquis.  Keep follow-up with Dr. Nelly Laurence, possibility of Tikosyn has been discussed if symptomatic rhythm frequency increases.   3.  Primary hypertension.  Track blood pressure at home.  No changes made to present regimen which also includes Cozaar.   4.  Mixed hyperlipidemia on Crestor 20 mg daily.  LDL 51 in December 2023.  Disposition:  Follow up  6 months.  Signed, Jonelle Sidle, M.D., F.A.C.C. [facility]

## 2022-12-26 NOTE — Patient Instructions (Addendum)
Medication Instructions:  Continue all current medications.   Labwork: none  Testing/Procedures: none  Follow-Up: 6 months   Any Other Special Instructions Will Be Listed Below (If Applicable).   If you need a refill on your cardiac medications before your next appointment, please call your pharmacy.  

## 2022-12-27 LAB — CUP PACEART REMOTE DEVICE CHECK
Date Time Interrogation Session: 20241103230312
Implantable Pulse Generator Implant Date: 20220210

## 2023-01-01 ENCOUNTER — Ambulatory Visit (INDEPENDENT_AMBULATORY_CARE_PROVIDER_SITE_OTHER): Payer: 59

## 2023-01-01 DIAGNOSIS — I4891 Unspecified atrial fibrillation: Secondary | ICD-10-CM

## 2023-01-02 ENCOUNTER — Other Ambulatory Visit: Payer: Self-pay

## 2023-01-02 NOTE — Patient Outreach (Signed)
Care Management   Visit Note  01/02/2023 Name: [name] MRN: [id number] DOB: November 19, 1940  Subjective: [name] is a 82 y.o. year old female who is a primary care patient of Ellamae Sia, Aleen Campi, FNP. The Care Management team was consulted for assistance.      Engaged with patient spoke with patient by telephone.    Goals Addressed             This Visit's Progress    RNCM Care Management  (ATRIAL FIBRILLATION) EXPECTED OUTCOME: MONITOR, SELF-MANAGE AND REDUCE SYMPTOMS OF ATRIAL FIBRILLATION       Current Barriers:  Knowledge Deficits related to Atrial fibrillation management Chronic Disease Management support and education needs related to Atrial fibrillation Patient reports she has all medications and taking as prescribed including blood thinner Patient reports she has a cane and uses when needed   Planned Interventions: Counseled on increased risk of stroke due to Afib and benefits of anticoagulation for stroke prevention. Saw the cardiologist recently and got a good report. Her blood pressures were up a little when she arrived but went down after being their for a little while. Discussed white coat syndrome. She states that over all she feels like she is doing well. She does have more stress, depression, and anxiety due to her son moving back home with her two grandchildren. She states it can be very stressful on her. She is not sleeping but she says she feels this is also in part with her trazodone being discontinued. In basket message sent to the pcp to ask for recommendations. Will follow up accordingly with the patient after discussion with the pcp.            Reviewed importance of adherence to anticoagulant exactly as prescribed. States compliance with medications.  Advised patient to discuss issues with heart rate, exacerbation of A-fib with provider Counseled on importance of regular laboratory monitoring as prescribed Afib action plan reviewed Screening for  signs and symptoms of depression related to chronic disease state Assessed social determinant of health barriers Reviewed upcoming scheduled appointments. Sees cardiologist in the new year Atrial fibrillation action plan reinforced Has a life alert system, review of safety precautions related to falls and increased risk of bleeding.   Symptom Management: Take medications as prescribed   Attend all scheduled provider appointments Call pharmacy for medication refills 3-7 days in advance of running out of medications Attend church or other social activities Perform all self care activities independently  Call provider office for new concerns or questions  - check pulse (heart) rate before taking medicine - make a plan to exercise regularly - make a plan to eat healthy - keep all lab appointments - take medicine as prescribed - wear medical alert identification Follow Atrial fibrillation action plan- call your doctor as needed for symptom management  Follow Up Plan: Telephone follow up appointment with care management team member scheduled for:  03-13-2023 at 345 pm       RNCM Care Management  (DIABETES) EXPECTED OUTCOME: MONITOR, SELF- MANAGE AND REDUCE SYMPTOMS OF DIABETES       Current Barriers:  Knowledge Deficits related to Diabetes management Care Coordination needs related to medication management in a patient with Diabetes Chronic Disease Management support and education needs related to Diabetes, diet Patient reports she lives with her son and 2 grands  has lots of support from her adult daughter and family, pt has never driven and daughter transports pt to all appointments and running  errands, etc. Patient reports she used to check CBG years ago and not interested in checking now, does not like needles, states she was taken off metformin and was no longer on any medication for diabetes, has now been placed back on metformin, AIC increased to 8.2 up from 6.4 and 7.4 over past 2  years Patient reports her granddaughter is willing to check CBG and pt sees her frequently, pt states she has not started checking CBG, still not interested in at present Lab Results  Component Value Date   HGBA1C 5.6 11/30/2022   Previous was 8.2  Planned Interventions: Reviewed medications with patient and discussed importance of medication adherence. The patient states compliance in medications;        Counseled on importance of regular laboratory monitoring as prescribed. The patient has regular labs ;        Discussed plans with patient for ongoing care management follow up and provided patient with direct contact information for care management team;      Advised patient, providing education and rationale, to check cbg per doctor's order  and record        call provider for findings outside established parameters;       Review of patient status, including review of consultants reports, relevant laboratory and other test results, and medications completed;       The patient states she is happy with her blood sugars being better. Denies any acute changes in her DM today. Will continue to monitor.  Reviewed carbohydrate modified diet  Symptom Management: Take medications as prescribed   Attend all scheduled provider appointments Call pharmacy for medication refills 3-7 days in advance of running out of medications Attend church or other social activities Perform all self care activities independently  Call provider office for new concerns or questions  check blood sugar at prescribed times: per doctor's order  check feet daily for cuts, sores or redness enter blood sugar readings and medication or insulin into daily log take the blood sugar log to all doctor visits take the blood sugar meter to all doctor visits trim toenails straight across eat fish at least once per week fill half of plate with vegetables limit fast food meals to no more than 1 per week manage portion  size prepare main meal at home 3 to 5 days each week read food labels for fat, fiber, carbohydrates and portion size set a realistic goal wash and dry feet carefully every day wear comfortable, cotton socks wear comfortable, well-fitting shoes Consider having your granddaughter check your blood sugar  Follow Up Plan: Telephone follow up appointment with care management team member scheduled for:   03-13-2023 at 345 pm             Consent to Services:  Patient was given information about care management services, agreed to services, and gave verbal consent to participate.   Plan: Telephone follow up appointment with care management team member scheduled for: 03-13-2023 at 345pm  Alto Denver RN, MSN, CCM RN Care Manager  [facility]  Ambulatory Care Management  Direct Number: [phone]

## 2023-01-02 NOTE — Patient Instructions (Signed)
Visit Information  Thank you for taking time to visit with me today. Please don't hesitate to contact me if I can be of assistance to you before our next scheduled telephone appointment.  Following are the goals we discussed today:   Goals Addressed             This Visit's Progress    RNCM Care Management  (ATRIAL FIBRILLATION) EXPECTED OUTCOME: MONITOR, SELF-MANAGE AND REDUCE SYMPTOMS OF ATRIAL FIBRILLATION       Current Barriers:  Knowledge Deficits related to Atrial fibrillation management Chronic Disease Management support and education needs related to Atrial fibrillation Patient reports she has all medications and taking as prescribed including blood thinner Patient reports she has a cane and uses when needed   Planned Interventions: Counseled on increased risk of stroke due to Afib and benefits of anticoagulation for stroke prevention. Saw the cardiologist recently and got a good report. Her blood pressures were up a little when she arrived but went down after being their for a little while. Discussed white coat syndrome. She states that over all she feels like she is doing well. She does have more stress, depression, and anxiety due to her son moving back home with her two grandchildren. She states it can be very stressful on her. She is not sleeping but she says she feels this is also in part with her trazodone being discontinued. In basket message sent to the pcp to ask for recommendations. Will follow up accordingly with the patient after discussion with the pcp.            Reviewed importance of adherence to anticoagulant exactly as prescribed. States compliance with medications.  Advised patient to discuss issues with heart rate, exacerbation of A-fib with provider Counseled on importance of regular laboratory monitoring as prescribed Afib action plan reviewed Screening for signs and symptoms of depression related to chronic disease state Assessed social determinant of health  barriers Reviewed upcoming scheduled appointments. Sees cardiologist in the new year Atrial fibrillation action plan reinforced Has a life alert system, review of safety precautions related to falls and increased risk of bleeding.   Symptom Management: Take medications as prescribed   Attend all scheduled provider appointments Call pharmacy for medication refills 3-7 days in advance of running out of medications Attend church or other social activities Perform all self care activities independently  Call provider office for new concerns or questions  - check pulse (heart) rate before taking medicine - make a plan to exercise regularly - make a plan to eat healthy - keep all lab appointments - take medicine as prescribed - wear medical alert identification Follow Atrial fibrillation action plan- call your doctor as needed for symptom management  Follow Up Plan: Telephone follow up appointment with care management team member scheduled for:  03-13-2023 at 345 pm       RNCM Care Management  (DIABETES) EXPECTED OUTCOME: MONITOR, SELF- MANAGE AND REDUCE SYMPTOMS OF DIABETES       Current Barriers:  Knowledge Deficits related to Diabetes management Care Coordination needs related to medication management in a patient with Diabetes Chronic Disease Management support and education needs related to Diabetes, diet Patient reports she lives with her son and 2 grands  has lots of support from her adult daughter and family, pt has never driven and daughter transports pt to all appointments and running errands, etc. Patient reports she used to check CBG years ago and not interested in checking now, does not like  needles, states she was taken off metformin and was no longer on any medication for diabetes, has now been placed back on metformin, AIC increased to 8.2 up from 6.4 and 7.4 over past 2 years Patient reports her granddaughter is willing to check CBG and pt sees her frequently, pt states she  has not started checking CBG, still not interested in at present Lab Results  Component Value Date   HGBA1C 5.6 11/30/2022   Previous was 8.2  Planned Interventions: Reviewed medications with patient and discussed importance of medication adherence. The patient states compliance in medications;        Counseled on importance of regular laboratory monitoring as prescribed. The patient has regular labs ;        Discussed plans with patient for ongoing care management follow up and provided patient with direct contact information for care management team;      Advised patient, providing education and rationale, to check cbg per doctor's order  and record        call provider for findings outside established parameters;       Review of patient status, including review of consultants reports, relevant laboratory and other test results, and medications completed;       The patient states she is happy with her blood sugars being better. Denies any acute changes in her DM today. Will continue to monitor.  Reviewed carbohydrate modified diet  Symptom Management: Take medications as prescribed   Attend all scheduled provider appointments Call pharmacy for medication refills 3-7 days in advance of running out of medications Attend church or other social activities Perform all self care activities independently  Call provider office for new concerns or questions  check blood sugar at prescribed times: per doctor's order  check feet daily for cuts, sores or redness enter blood sugar readings and medication or insulin into daily log take the blood sugar log to all doctor visits take the blood sugar meter to all doctor visits trim toenails straight across eat fish at least once per week fill half of plate with vegetables limit fast food meals to no more than 1 per week manage portion size prepare main meal at home 3 to 5 days each week read food labels for fat, fiber, carbohydrates and portion  size set a realistic goal wash and dry feet carefully every day wear comfortable, cotton socks wear comfortable, well-fitting shoes Consider having your granddaughter check your blood sugar  Follow Up Plan: Telephone follow up appointment with care management team member scheduled for:   03-13-2023 at 345 pm           Our next appointment is by telephone on 03-13-2023 at 345 pm  Please call the care guide team at [phone] if you need to cancel or reschedule your appointment.   If you are experiencing a Mental Health or Behavioral Health Crisis or need someone to talk to, please call the Suicide and Crisis Lifeline: 988 call the [place] National Suicide Prevention Lifeline: 2762210272 or TTY: 567-574-5503 TTY (438) 332-2729) to talk to a trained counselor call 1-800-273-TALK (toll free, 24 hour hotline) call the [facility]: (718) 124-8710   Patient verbalizes understanding of instructions and care plan provided today and agrees to view in MyChart. Active MyChart status and patient understanding of how to access instructions and care plan via MyChart confirmed with patient.       Alto Denver RN, MSN, CCM RN Care Manager  [facility]  Ambulatory Care Management  Direct  Number: [phone]

## 2023-01-03 ENCOUNTER — Telehealth: Payer: Self-pay | Admitting: Family Medicine

## 2023-01-03 DIAGNOSIS — F5101 Primary insomnia: Secondary | ICD-10-CM

## 2023-01-03 MED ORDER — TRAZODONE HCL 150 MG PO TABS
150.0000 mg | ORAL_TABLET | Freq: Every day | ORAL | 1 refills | Status: DC
Start: 2023-01-03 — End: 2023-04-13

## 2023-01-03 NOTE — Telephone Encounter (Signed)
Refill of trazodone placed.

## 2023-01-03 NOTE — Telephone Encounter (Signed)
-----   Message from Marlowe Sax sent at 01/02/2023  3:35 PM EST ----- Ursula Alert, I spoke to the patient today and she says that she is not sleeping since the trazadone has been discontinued. She says the Cymbalta is helping some with her mood but she is laying in bed until 2am before going to sleep. She does not come back to see you until January. I told her I would reach out to  you for recommendations. Please advise. I am happy to call her back and let her know your recommendations.  Thanks,  Pam

## 2023-01-07 IMAGING — DX DG CHEST 1V PORT
1 series · 1 of 1 positions shown · non-contrast
Comparison: 11/11/2018

CLINICAL DATA: Left-sided shoulder pain beginning this morning.
Atrial fibrillation. Diabetes.

EXAM:
PORTABLE CHEST 1 VIEW

[chest ap]
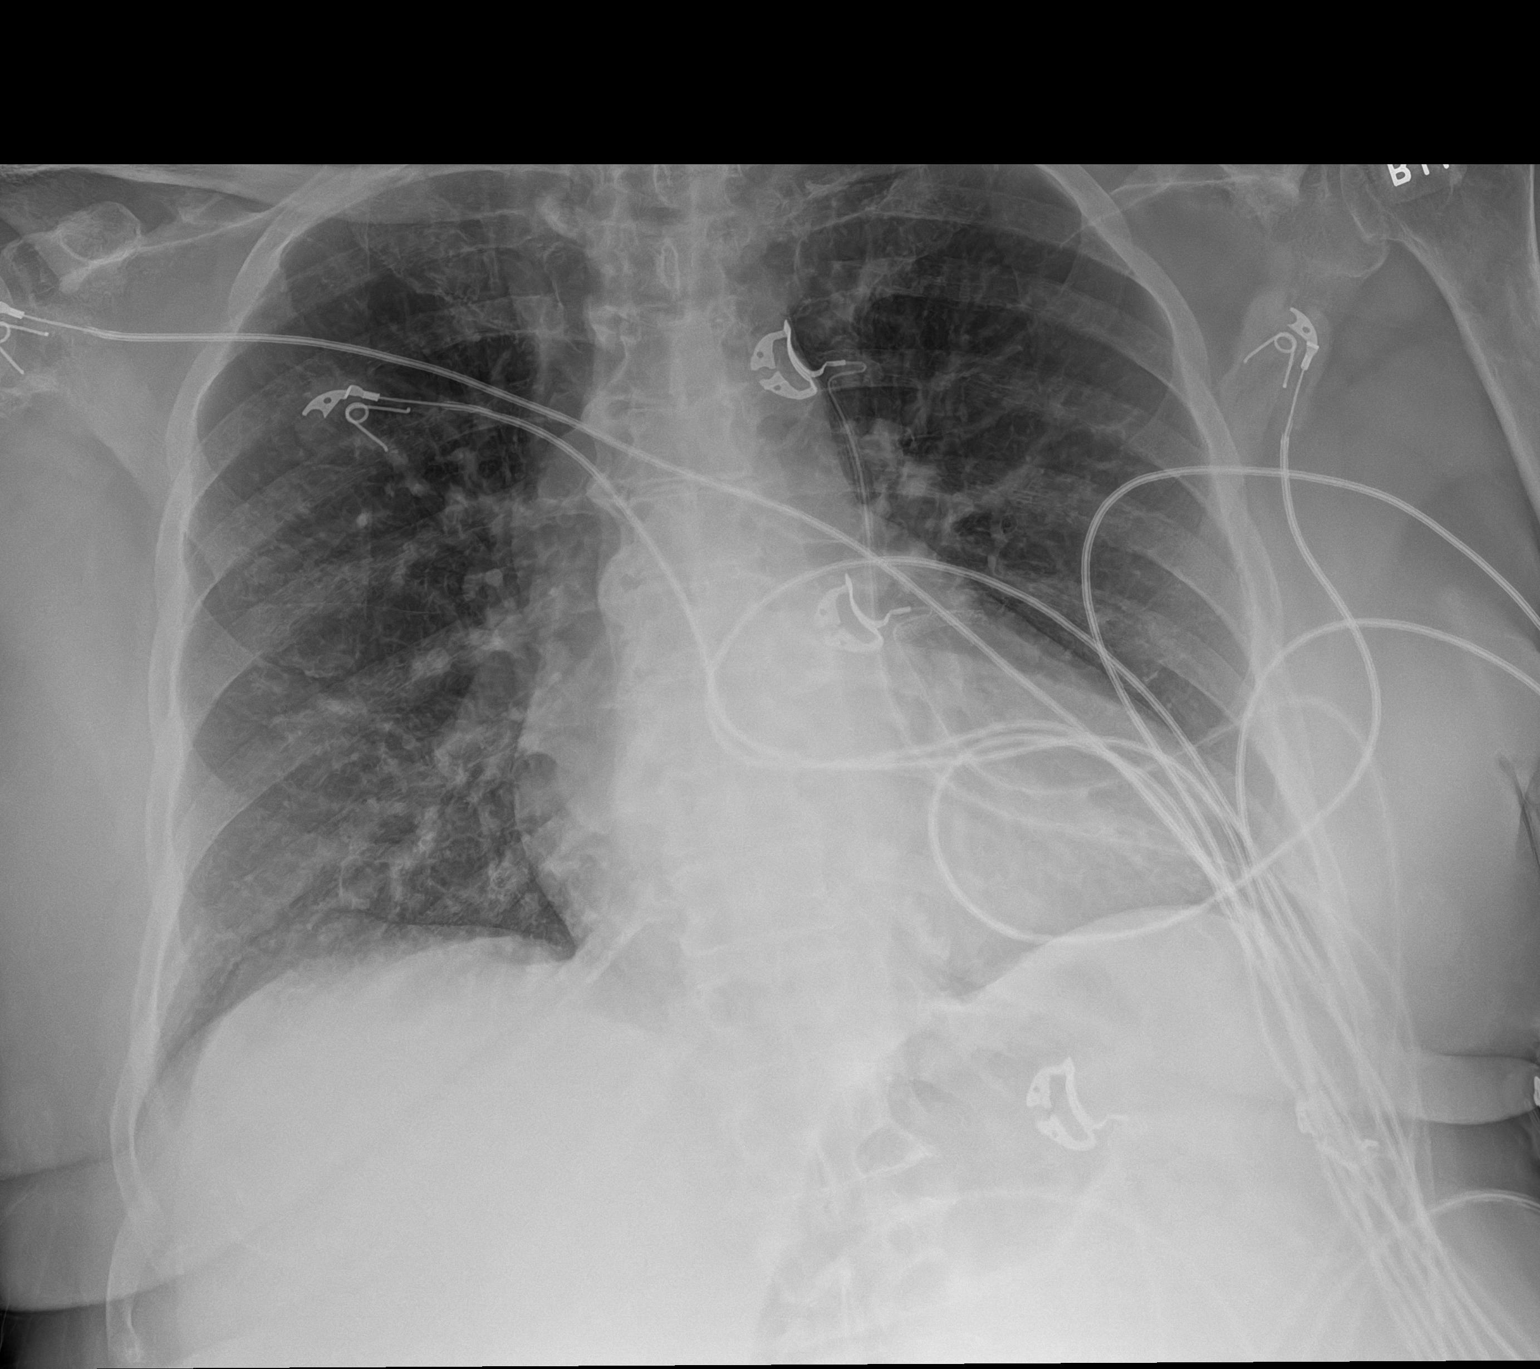

[1 of 1 positions shown; findings below may reference images not displayed]

FINDINGS: Stable mild cardiomegaly. Both lungs are clear. No evidence of
pleural effusion.
IMPRESSION: Stable mild cardiomegaly. No active lung disease.

## 2023-01-19 ENCOUNTER — Other Ambulatory Visit: Payer: Self-pay | Admitting: Cardiology

## 2023-01-26 NOTE — Progress Notes (Signed)
Carelink Summary Report / Loop Recorder 

## 2023-02-05 ENCOUNTER — Ambulatory Visit (INDEPENDENT_AMBULATORY_CARE_PROVIDER_SITE_OTHER): Payer: 59

## 2023-02-05 ENCOUNTER — Other Ambulatory Visit: Payer: Self-pay | Admitting: Family Medicine

## 2023-02-05 DIAGNOSIS — E1159 Type 2 diabetes mellitus with other circulatory complications: Secondary | ICD-10-CM

## 2023-02-05 DIAGNOSIS — I4891 Unspecified atrial fibrillation: Secondary | ICD-10-CM

## 2023-02-05 DIAGNOSIS — F339 Major depressive disorder, recurrent, unspecified: Secondary | ICD-10-CM

## 2023-02-05 DIAGNOSIS — F418 Other specified anxiety disorders: Secondary | ICD-10-CM

## 2023-02-05 LAB — CUP PACEART REMOTE DEVICE CHECK
Date Time Interrogation Session: 20241215230309
Implantable Pulse Generator Implant Date: 20220210

## 2023-03-05 ENCOUNTER — Ambulatory Visit: Payer: 59

## 2023-03-05 VITALS — Ht 64.0 in | Wt 151.0 lb

## 2023-03-05 DIAGNOSIS — Z0000 Encounter for general adult medical examination without abnormal findings: Secondary | ICD-10-CM | POA: Diagnosis not present

## 2023-03-05 NOTE — Progress Notes (Signed)
 Subjective:   [name] is a 83 y.o. female who presents for Medicare Annual (Subsequent) preventive examination.  Visit Complete: Virtual I connected with  ADNA NOFZIGER on 03/05/23 by a audio enabled telemedicine application and verified that I am speaking with the correct person using two identifiers.  Patient Location: Home  Provider Location: Home Office  This patient declined Interactive audio and video telecommunications. Therefore the visit was completed with audio only.  I discussed the limitations of evaluation and management by telemedicine. The patient expressed understanding and agreed to proceed.  Vital Signs: Because this visit was a virtual/telehealth visit, some criteria may be missing or patient reported. Any vitals not documented were not able to be obtained and vitals that have been documented are patient reported.  Cardiac Risk Factors include: advanced age (>50men, >95 women);diabetes mellitus;dyslipidemia;hypertension;sedentary lifestyle     Objective:    Today's Vitals   03/05/23 1526  Weight: 151 lb (68.5 kg)  Height: 5' 4 (1.626 m)   Body mass index is 25.92 kg/m.     03/05/2023    3:41 PM 05/02/2022   11:48 AM 03/02/2022    1:29 PM 12/14/2021   12:37 PM 09/16/2021    1:32 PM 02/16/2021   12:43 PM 01/09/2021    1:38 PM  Advanced Directives  Does Patient Have a Medical Advance Directive? No Yes No Yes Yes Yes No  Type of Special Educational Needs Teacher of [employer] [place];Living will  Healthcare Power of [place];Living will Living will Healthcare Power of Attorney   Does patient want to make changes to medical advance directive?  No - Patient declined  No - Patient declined No - Patient declined No - Patient declined   Copy of Healthcare Power of Attorney in Chart?  No - copy requested  No - copy requested     Would patient like information on creating a medical advance directive? Yes (MAU/Ambulatory/Procedural Areas - Information given)  No -  Patient declined  No - Patient declined  No - Patient declined    Current Medications (verified) Outpatient Encounter Medications as of 03/05/2023  Medication Sig   acetaminophen  (TYLENOL ) 500 MG tablet Take 1,000 mg by mouth every 6 (six) hours as needed for moderate pain.   Ascorbic Acid  (VITAMIN C ) 500 MG tablet Take 500 mg by mouth daily.     bimatoprost (LUMIGAN) 0.01 % SOLN Place 1 drop into both eyes at bedtime.   Calcium  Carbonate Antacid (TUMS PO) Take 2 tablets by mouth 4 (four) times daily as needed (for acid reflux/indigestion).    Camphor-Menthol -Methyl Sal (SALONPAS ) 3.02-26-08 % PTCH Apply 1 patch topically daily as needed (pain).   cetirizine (ZYRTEC) 10 MG tablet Take 10 mg by mouth daily as needed for allergies.   Cyanocobalamin  (VITAMIN B 12 PO) Take 1 tablet by mouth daily.   diclofenac  Sodium (VOLTAREN ) 1 % GEL Apply 2 g topically 4 (four) times daily. (Patient taking differently: Apply 2 g topically 4 (four) times daily as needed (pain).)   diltiazem  (CARDIZEM  CD) 300 MG 24 hr capsule TAKE ONE CAPSULE BY MOUTH DAILY   DULoxetine  (CYMBALTA ) 20 MG capsule TAKE ONE CAPSULE BY MOUTH DAILY   ELIQUIS  5 MG TABS tablet TAKE ONE TABLET BY MOUTH TWICE DAILY   GARLIC  PO Take 1 tablet by mouth daily.   HYDROcodone -acetaminophen  (NORCO) 7.5-325 MG tablet Take 1 tablet by mouth daily as needed for moderate pain.   levothyroxine  (SYNTHROID ) 25 MCG tablet Take 1 tablet (25 mcg total)  by mouth daily before breakfast.   losartan  (COZAAR ) 50 MG tablet TAKE ONE (1) TABLET BY MOUTH EVERY DAY   MAGNESIUM  CARBONATE PO Take 1 tablet by mouth at bedtime.   metFORMIN  (GLUCOPHAGE ) 500 MG tablet TAKE 1 TABLET BY MOUTH TWICE DAILY WITH MEALS   metoprolol  succinate (TOPROL -XL) 100 MG 24 hr tablet TAKE 1 TABLET BY MOUTH DAILY WITH FOOD   nitroGLYCERIN  (NITROSTAT ) 0.4 MG SL tablet DISSOLVE 1 TAB UNDER TOUNGE FOR CHEST PAIN. MAY REPEAT EVERY 5 MINUTES FOR 3 DOSES. IF NO RELIEF CALL 911 OR GO TO ER    rosuvastatin  (CRESTOR ) 20 MG tablet TAKE ONE (1) TABLET BY MOUTH EVERY DAY   timolol  (TIMOPTIC ) 0.5 % ophthalmic solution Place 1 drop into both eyes daily.   traZODone  (DESYREL ) 150 MG tablet Take 1 tablet (150 mg total) by mouth at bedtime.   TURMERIC PO Take 1 tablet by mouth daily.   VENTOLIN  HFA 108 (90 Base) MCG/ACT inhaler Inhale 1-2 puffs into the lungs every 4 (four) hours as needed for shortness of breath.   Vitamin D , Cholecalciferol , 10 MCG (400 UNIT) CAPS Take 400 Units by mouth daily.   No facility-administered encounter medications on file as of 03/05/2023.    Allergies (verified) Patient has no known allergies.   History: Past Medical History:  Diagnosis Date   Anxiety    Arthritis    Bulging lumbar disc    Chronic lower back pain    Coronary atherosclerosis of native coronary artery    DES LAD 02/2008, DES LAD 02/2015, DES ramus and DES x2 mid RCA 02/2020   Depression    Dyslipidemia    Essential hypertension    Facial numbness    Frequent headaches    GERD (gastroesophageal reflux disease)    Glaucoma    Heart attack (HCC) 02/2020   Insomnia    Paroxysmal atrial fibrillation (HCC)    Diagnosed October 2019   PSVT (paroxysmal supraventricular tachycardia) (HCC)    Refusal of blood transfusions as patient is Jehovah's Witness    Thyroid  disease    Type 2 diabetes mellitus (HCC)    Borderline   Vitamin D  deficiency    Past Surgical History:  Procedure Laterality Date   ATRIAL FIBRILLATION ABLATION N/A 02/16/2021   Procedure: ATRIAL FIBRILLATION ABLATION;  Surgeon: Kelsie Agent, MD;  Location: [facility] INVASIVE CV LAB;  Service: Cardiovascular;  Laterality: N/A;   BIOPSY  09/21/2021   Procedure: BIOPSY;  Surgeon: Shaaron Lamar HERO, MD;  Location: [facility] ENDO SU[facility];  Service: Endoscopy;;  gastric   CARDIAC CATHETERIZATION N/A 02/23/2015   Procedure: Left Heart Cath and Coronary Angiography;  Surgeon: Ozell Fell, MD;  Location: [facility] INVASIVE CV LAB;  Service:  Cardiovascular;  Laterality: N/A;   CARDIAC CATHETERIZATION N/A 02/23/2015   Procedure: Coronary Stent Intervention;  Surgeon: Ozell Fell, MD;  Location: [facility] INVASIVE CV LAB;  Service: Cardiovascular;  Laterality: N/A;   CARDIAC CATHETERIZATION N/A 01/04/2016   Procedure: Left Heart Cath and Coronary Angiography;  Surgeon: Peter M Jordan, MD;  Location: [facility] INVASIVE CV LAB;  Service: Cardiovascular;  Laterality: N/A;   CATARACT EXTRACTION     CORONARY ANGIOPLASTY  02/23/2015   CORONARY ANGIOPLASTY WITH STENT PLACEMENT  2009   CORONARY STENT INTERVENTION N/A 03/09/2020   Procedure: CORONARY STENT INTERVENTION;  Surgeon: Dann Candyce RAMAN, MD;  Location: [facility] INVASIVE CV LAB;  Service: Cardiovascular;  Laterality: N/A;   DILATION AND CURETTAGE OF UTERUS     ESOPHAGOGASTRODUODENOSCOPY (EGD) WITH PROPOFOL  N/A 09/21/2021  normal esophagus, abnormal gastric mucosa s/p biopsy, normal duodenum. Pathology with H.pylori. Prescribed Prevpac.   implantable loop recorder placement  04/01/2020   Medtronic Reveal Creola model LNQ 22 [phone]) implantable loop recorder    LEFT HEART CATH AND CORONARY ANGIOGRAPHY N/A 03/09/2020   Procedure: LEFT HEART CATH AND CORONARY ANGIOGRAPHY;  Surgeon: Dann Candyce RAMAN, MD;  Location: [facility] INVASIVE CV LAB;  Service: Cardiovascular;  Laterality: N/A;   TUBAL LIGATION     Family History  Problem Relation Age of Onset   Other Mother        natural causes   Heart disease Mother    Cancer Father        unsure of type   Colon cancer Daughter        2011   Social History   Socioeconomic History   Marital status: Widowed    Spouse name: Not on file   Number of children: 8   Years of education: 10th grade   Highest education level: 10th grade  Occupational History   Occupation: RETIRED  Tobacco Use   Smoking status: Never   Smokeless tobacco: Never   Tobacco comments:    Never smoke 11/15/21  Vaping Use   Vaping status: Never Used  Substance and  Sexual Activity   Alcohol use: Not Currently    Alcohol/week: 1.0 standard drink of alcohol    Types: 1 Glasses of wine per week    Comment: RARE OCCASION   Drug use: No   Sexual activity: Not Currently  Other Topics Concern   Not on file  Social History Narrative   Lives at home alone.   Right-handed.   No caffeine use.   Daughter lives next door and helps her out   Social Drivers of Health   Financial Resource Strain: Low Risk  (03/05/2023)   Overall Financial Resource Strain (CARDIA)    Difficulty of Paying Living Expenses: Not hard at all  Food Insecurity: No Food Insecurity (03/05/2023)   Hunger Vital Sign    Worried About Running Out of Food in the Last Year: Never true    Ran Out of Food in the Last Year: Never true  Transportation Needs: No Transportation Needs (03/05/2023)   PRAPARE - Administrator, Civil Service (Medical): No    Lack of Transportation (Non-Medical): No  Physical Activity: Inactive (03/05/2023)   Exercise Vital Sign    Days of Exercise per Week: 0 days    Minutes of Exercise per Session: 0 min  Stress: No Stress Concern Present (03/05/2023)   [employer] of Occupational Health - Occupational Stress Questionnaire    Feeling of Stress : Not at all  Social Connections: Moderately Isolated (03/05/2023)   Social Connection and Isolation Panel [NHANES]    Frequency of Communication with Friends and Family: More than three times a week    Frequency of Social Gatherings with Friends and Family: Three times a week    Attends Religious Services: More than 4 times per year    Active Member of Clubs or Organizations: No    Attends Banker Meetings: Never    Marital Status: Widowed    Tobacco Counseling Counseling given: Not Answered Tobacco comments: Never smoke 11/15/21   Clinical Intake:  Pre-visit preparation completed: Yes  Pain : No/denies pain     Diabetes: Yes CBG done?: No Did pt. bring in CBG monitor from  home?: No  How often do you need to have someone help you when  you read instructions, pamphlets, or other written materials from your doctor or pharmacy?: 1 - Never  Interpreter Needed?: No  Information entered by :: Charmaine Bloodgood LPN   Activities of Daily Living    03/05/2023    3:41 PM  In your present state of health, do you have any difficulty performing the following activities:  Hearing? 0  Vision? 0  Difficulty concentrating or making decisions? 0  Walking or climbing stairs? 0  Dressing or bathing? 0  Doing errands, shopping? 0  Preparing Food and eating ? N  Using the Toilet? N  In the past six months, have you accidently leaked urine? N  Do you have problems with loss of bowel control? N  Managing your Medications? N  Managing your Finances? N  Housekeeping or managing your Housekeeping? N    Patient Care Team: Milian, Marry Lenis, FNP as PCP - General (Family Medicine) [name], MD as PCP - Cardiology (Cardiology) Mealor, Eulas BRAVO, MD as PCP - Electrophysiology (Cardiology) [name], MD as Consulting Physician (Otolaryngology) [name], [facility] (Pharmacist) [name], NP as Nurse Practitioner (Endocrinology) Corlis Sharlet PARAS, RN as Case Manager (General Practice)  Indicate any recent Medical Services you may have received from other than [facility] providers in the past year (date may be approximate).     Assessment:   This is a routine wellness examination for Kharter.  Hearing/Vision screen Hearing Screening - Comments:: Denies hearing difficulties   Vision Screening - Comments:: Wears rx glasses - up to date with routine eye exams with [facility]     Goals Addressed   None   Depression Screen    03/05/2023    3:36 PM 11/30/2022    1:56 PM 08/31/2022    2:27 PM 06/06/2022   11:00 AM 05/09/2022   10:33 AM 05/02/2022   11:39 AM 04/20/2022   10:31 AM  PHQ 2/9 Scores  PHQ - 2 Score 2 2 0 4 2 1 2   PHQ- 9 Score 5 6 4 13 10  11      Fall Risk    03/05/2023    3:41 PM 11/30/2022    1:57 PM 08/31/2022    2:28 PM 06/06/2022   11:00 AM 05/09/2022   10:34 AM  Fall Risk   Falls in the past year? 0 0 0 0 0  Number falls in past yr: 0 1 0 0   Injury with Fall? 0 0 0 0 0  Risk for fall due to : No Fall Risks No Fall Risks No Fall Risks No Fall Risks No Fall Risks  Follow up Falls prevention discussed;Education provided;Falls evaluation completed Falls evaluation completed Falls evaluation completed Falls evaluation completed Falls evaluation completed    MEDICARE RISK AT HOME: Medicare Risk at Home Any stairs in or around the home?: No If so, are there any without handrails?: No Home free of loose throw rugs in walkways, pet beds, electrical cords, etc?: Yes Adequate lighting in your home to reduce risk of falls?: Yes Life alert?: No Use of a cane, walker or w/c?: No Grab bars in the bathroom?: Yes Shower chair or bench in shower?: No Elevated toilet seat or a handicapped toilet?: Yes  TIMED UP AND GO:  Was the test performed?  No    Cognitive Function:    06/06/2022   11:14 AM  MMSE - Mini Mental State Exam  Orientation to time 5  Orientation to Place 4  Registration 3  Attention/ Calculation  5  Recall 3  Language- name 2 objects 2  Language- repeat 1  Language- follow 3 step command 3  Language- read & follow direction 1  Write a sentence 1  Copy design 1  Total score 29        03/05/2023    3:41 PM 03/02/2022    1:29 PM 02/28/2021    2:14 PM 02/26/2020    2:07 PM  6CIT Screen  What Year? 0 points 0 points 0 points 0 points  What month? 0 points 0 points 0 points 0 points  What time? 0 points 0 points 0 points 0 points  Count back from 20 0 points 0 points 0 points 0 points  Months in reverse 2 points 0 points 2 points 0 points  Repeat phrase 0 points 0 points 2 points 4 points  Total Score 2 points 0 points 4 points 4 points    Immunizations Immunization History  Administered Date(s)  Administered   Fluad Quad(high Dose 65+) 11/20/2019, 12/03/2020, 12/16/2021   Fluad Trivalent(High Dose 65+) 11/30/2022   Influenza Split 12/05/2005, 12/03/2006   Influenza, High Dose Seasonal PF 12/25/2016, 01/07/2018   Influenza,inj,quad, With Preservative 12/03/2015   Influenza-Unspecified 01/30/1995, 12/13/2000, 03/16/2003, 01/21/2015, 12/25/2016, 01/07/2018, 11/21/2018   Moderna Covid-19 Fall Seasonal Vaccine 32yrs & older 11/17/2021   Moderna Covid-19 Vaccine Bivalent Booster 43yrs & up 01/18/2021   Moderna SARS-COV2 Booster Vaccination 12/31/2019, 07/04/2020   Moderna Sars-Covid-2 Vaccination 04/01/2019, 04/29/2019   PNEUMOCOCCAL CONJUGATE-20 11/30/2022   Pneumococcal Polysaccharide-23 01/30/1995, 12/05/2005, 03/11/2020   Pneumococcal-Unspecified 12/24/2018   Respiratory Syncytial Virus Vaccine,Recomb Aduvanted(Arexvy) 11/17/2021   Tdap 12/24/2018   Zoster Recombinant(Shingrix ) 03/04/2021, 06/24/2021    TDAP status: Up to date  Flu Vaccine status: Up to date  Pneumococcal vaccine status: Up to date  Covid-19 vaccine status: Information provided on how to obtain vaccines.   Qualifies for Shingles Vaccine? Yes   Zostavax completed No   Shingrix  Completed?: Yes  Screening Tests Health Maintenance  Topic Date Due   COVID-19 Vaccine (5 - 2024-25 season) 10/22/2022   FOOT EXAM  01/25/2023   HEMOGLOBIN A1C  05/31/2023   OPHTHALMOLOGY EXAM  10/03/2023   Diabetic kidney evaluation - eGFR measurement  11/30/2023   Diabetic kidney evaluation - Urine ACR  11/30/2023   Medicare Annual Wellness (AWV)  03/04/2024   DTaP/Tdap/Td (2 - Td or Tdap) 12/23/2028   Pneumonia Vaccine 59+ Years old  Completed   INFLUENZA VACCINE  Completed   DEXA SCAN  Completed   Zoster Vaccines- Shingrix   Completed   HPV VACCINES  Aged Out    Health Maintenance  Health Maintenance Due  Topic Date Due   COVID-19 Vaccine (5 - 2024-25 season) 10/22/2022   FOOT EXAM  01/25/2023    Colorectal  cancer screening: No longer required.   Mammogram status: No longer required due to age and preference.  Bone Density status: Completed 12/03/20. Results reflect: Bone density results: OSTEOPENIA. Repeat every 2-3 years.  Lung Cancer Screening: (Low Dose CT Chest recommended if Age 83-80 years, 20 pack-year currently smoking OR have quit w/in 15years.) does not qualify.   Lung Cancer Screening Referral: n/a  Additional Screening:  Hepatitis C Screening: does not qualify  Vision Screening: Recommended annual ophthalmology exams for early detection of glaucoma and other disorders of the eye. Is the patient up to date with their annual eye exam?  Yes  Who is the provider or what is the name of the office in which the patient attends annual  eye exams? [facility] If pt is not established with a provider, would they like to be referred to a provider to establish care? No .   Dental Screening: Recommended annual dental exams for proper oral hygiene  Diabetic Foot Exam: Diabetic Foot Exam: Overdue, Pt has been advised about the importance in completing this exam. Pt is scheduled for diabetic foot exam on at next office visit.  Community Resource Referral / Chronic Care Management: CRR required this visit?  No   CCM required this visit?  No     Plan:     I have personally reviewed and noted the following in the patient's chart:   Medical and social history Use of alcohol, tobacco or illicit drugs  Current medications and supplements including opioid prescriptions. Patient is currently taking opioid prescriptions. Information provided to patient regarding non-opioid alternatives. Patient advised to discuss non-opioid treatment plan with their provider. Functional ability and status Nutritional status Physical activity Advanced directives List of other physicians Hospitalizations, surgeries, and ER visits in previous 12 months Vitals Screenings to include cognitive, depression,  and falls Referrals and appointments  In addition, I have reviewed and discussed with patient certain preventive protocols, quality metrics, and best practice recommendations. A written personalized care plan for preventive services as well as general preventive health recommendations were provided to patient.     Lavelle Pfeiffer [place], [place]   [id number]   After Visit Summary: (Pick Up) Due to this being a telephonic visit, with patients personalized plan was offered to patient and patient has requested to Pick up at office.  Nurse Notes: No concerns at this time

## 2023-03-05 NOTE — Patient Instructions (Signed)
 [name] , Thank you for taking time to come for your Medicare Wellness Visit. I appreciate your ongoing commitment to your health goals. Please review the following plan we discussed and let me know if I can assist you in the future.   Referrals/Orders/Follow-Ups/Clinician Recommendations: Aim for 30 minutes of exercise or brisk walking, 6-8 glasses of water, and 5 servings of fruits and vegetables each day.  This is a list of the screening recommended for you and due dates:  Health Maintenance  Topic Date Due   COVID-19 Vaccine (5 - 2024-25 season) 10/22/2022   Complete foot exam   01/25/2023   Hemoglobin A1C  05/31/2023   Eye exam for diabetics  10/03/2023   Yearly kidney function blood test for diabetes  11/30/2023   Yearly kidney health urinalysis for diabetes  11/30/2023   Medicare Annual Wellness Visit  03/04/2024   DTaP/Tdap/Td vaccine (2 - Td or Tdap) 12/23/2028   Pneumonia Vaccine  Completed   Flu Shot  Completed   DEXA scan (bone density measurement)  Completed   Zoster (Shingles) Vaccine  Completed   HPV Vaccine  Aged Out    Advanced directives: (ACP Link)Information on Advanced Care Planning can be found at [place]  Secretary of [facility] Advance Health Care Directives Advance Health Care Directives ([email or web address]   Next Medicare Annual Wellness Visit scheduled for next year: Yes

## 2023-03-06 ENCOUNTER — Encounter: Payer: Self-pay | Admitting: Family Medicine

## 2023-03-06 ENCOUNTER — Ambulatory Visit (INDEPENDENT_AMBULATORY_CARE_PROVIDER_SITE_OTHER): Payer: 59 | Admitting: Family Medicine

## 2023-03-06 VITALS — BP 138/84 | HR 68 | Temp 97.8°F | Ht 64.0 in | Wt 151.0 lb

## 2023-03-06 DIAGNOSIS — R6889 Other general symptoms and signs: Secondary | ICD-10-CM | POA: Diagnosis not present

## 2023-03-06 DIAGNOSIS — E785 Hyperlipidemia, unspecified: Secondary | ICD-10-CM | POA: Diagnosis not present

## 2023-03-06 DIAGNOSIS — M25552 Pain in left hip: Secondary | ICD-10-CM | POA: Diagnosis not present

## 2023-03-06 DIAGNOSIS — I152 Hypertension secondary to endocrine disorders: Secondary | ICD-10-CM | POA: Diagnosis not present

## 2023-03-06 DIAGNOSIS — E1169 Type 2 diabetes mellitus with other specified complication: Secondary | ICD-10-CM

## 2023-03-06 DIAGNOSIS — F339 Major depressive disorder, recurrent, unspecified: Secondary | ICD-10-CM

## 2023-03-06 DIAGNOSIS — E1159 Type 2 diabetes mellitus with other circulatory complications: Secondary | ICD-10-CM | POA: Diagnosis not present

## 2023-03-06 DIAGNOSIS — Z79899 Other long term (current) drug therapy: Secondary | ICD-10-CM | POA: Diagnosis not present

## 2023-03-06 DIAGNOSIS — R438 Other disturbances of smell and taste: Secondary | ICD-10-CM

## 2023-03-06 DIAGNOSIS — G8929 Other chronic pain: Secondary | ICD-10-CM | POA: Diagnosis not present

## 2023-03-06 DIAGNOSIS — M25551 Pain in right hip: Secondary | ICD-10-CM | POA: Diagnosis not present

## 2023-03-06 LAB — BAYER DCA HB A1C WAIVED: HB A1C (BAYER DCA - WAIVED): 5.9 % — ABNORMAL HIGH (ref 4.8–5.6)

## 2023-03-06 MED ORDER — HYDROCODONE-ACETAMINOPHEN 7.5-325 MG PO TABS: 1.0000 | ORAL_TABLET | Freq: Every day | ORAL | 0 refills | Status: AC | PRN

## 2023-03-06 NOTE — Progress Notes (Addendum)
 Subjective:  Patient ID: [name], female    DOB: 1940/10/13, 83 y.o.   MRN: [id number]  Patient Care Team: Cathlene Marry Lenis, FNP as PCP - General (Family Medicine) [name], MD as PCP - Cardiology (Cardiology) Mealor, Eulas BRAVO, MD as PCP - Electrophysiology (Cardiology) [name], MD as Consulting Physician (Otolaryngology) [name], [facility] (Pharmacist) [name], NP as Nurse Practitioner (Endocrinology) Corlis Sharlet PARAS, RN as Case Manager (General Practice) [facility]   Chief Complaint:  Medical Management of Chronic Issues (3 month follow up)  HPI: [name] is a 83 y.o. female presenting on 03/06/2023 for Medical Management of Chronic Issues (3 month follow up)  HPI Type 2 diabetes mellitus with other circulatory complication, without long-term current use of insulin  (HCC) Glucometer: does have one, but has .  Taking medication(s): metformin , tolerating well   Last eye exam: completed 10/03/2022 Last foot exam: due Last A1c:  Lab Results  Component Value Date   HGBA1C 5.6 11/30/2022   Nephropathy screen indicated?: completed 11/2022 Last flu, zoster and/or pneumovax:  Immunization History  Administered Date(s) Administered   Fluad Quad(high Dose 65+) 11/20/2019, 12/03/2020, 12/16/2021   Fluad Trivalent(High Dose 65+) 11/30/2022   Influenza Split 12/05/2005, 12/03/2006   Influenza, High Dose Seasonal PF 12/25/2016, 01/07/2018   Influenza,inj,quad, With Preservative 12/03/2015   Influenza-Unspecified 01/30/1995, 12/13/2000, 03/16/2003, 01/21/2015, 12/25/2016, 01/07/2018, 11/21/2018   Moderna Covid-19 Fall Seasonal Vaccine 31yrs & older 11/17/2021   Moderna Covid-19 Vaccine Bivalent Booster 24yrs & up 01/18/2021   Moderna SARS-COV2 Booster Vaccination 12/31/2019, 07/04/2020   Moderna Sars-Covid-2 Vaccination 04/01/2019, 04/29/2019   PNEUMOCOCCAL CONJUGATE-20 11/30/2022   Pneumococcal Polysaccharide-23  01/30/1995, 12/05/2005, 03/11/2020   Pneumococcal-Unspecified 12/24/2018   Respiratory Syncytial Virus Vaccine,Recomb Aduvanted(Arexvy) 11/17/2021   Tdap 12/24/2018   Zoster Recombinant(Shingrix ) 03/04/2021, 06/24/2021   ROS: Denies dizziness, LOC, polyuria, polydipsia, unintended weight loss/gain, foot ulcerations, numbness or tingling in extremities, shortness of breath or chest pain.  Chronic hip pain, bilateral Continues to take Norco. States that she takes it once per day. She will take it for 6 days out of the week. Reports that sometimes she takes it twice per day.   Depression, recurrent (HCC) States that she has good days and bad days. States that sometimes she has nightmares and believes that it comes from the allergy medications. Is able to sleep with trazodone . Denies SI. Denies interest in going to counseling.   Forgetfulness States that this is the same. Does not drive. Lives next door to daughter who watches over her. Denies falls, Denies unsafe situations such as leaving the stove on.   Hyperlipidemia associated with type 2 diabetes mellitus (HCC) Established with Cardiology. Taking crestor   Denies side effects   Hypertension associated with diabetes [facility]) Established with Cardiology  Has BP monitor at home No BP at home average has not been checking at home  ROS Denies peripheral edema, chest pain, headaches, palpitations, sweats, SOB, PND, orthopnea Meds losartan   CAD risks Diabetes Mellitus, hypertension changes to vision  Metallic taste Believes that she saw ENT, but she is unsure. She states that she was referred to someone else. She is going to try to contact them when she gets home.    Relevant past medical, surgical, family, and social history reviewed and updated as indicated.  Allergies and medications reviewed and updated. Data reviewed: Chart in Epic.   Past Medical History:  Diagnosis Date   Anxiety    Arthritis  Bulging lumbar disc    Chronic  lower back pain    Coronary atherosclerosis of native coronary artery    DES LAD 02/2008, DES LAD 02/2015, DES ramus and DES x2 mid RCA 02/2020   Depression    Dyslipidemia    Essential hypertension    Facial numbness    Frequent headaches    GERD (gastroesophageal reflux disease)    Glaucoma    Heart attack (HCC) 02/2020   Insomnia    Paroxysmal atrial fibrillation (HCC)    Diagnosed October 2019   PSVT (paroxysmal supraventricular tachycardia) (HCC)    Refusal of blood transfusions as patient is Jehovah's Witness    Thyroid  disease    Type 2 diabetes mellitus (HCC)    Borderline   Vitamin D  deficiency     Past Surgical History:  Procedure Laterality Date   ATRIAL FIBRILLATION ABLATION N/A 02/16/2021   Procedure: ATRIAL FIBRILLATION ABLATION;  Surgeon: Kelsie Agent, MD;  Location: [facility] INVASIVE CV LAB;  Service: Cardiovascular;  Laterality: N/A;   BIOPSY  09/21/2021   Procedure: BIOPSY;  Surgeon: Shaaron Lamar HERO, MD;  Location: [facility] ENDO SUITE;  Service: Endoscopy;;  gastric   CARDIAC CATHETERIZATION N/A 02/23/2015   Procedure: Left Heart Cath and Coronary Angiography;  Surgeon: Ozell Fell, MD;  Location: [facility] INVASIVE CV LAB;  Service: Cardiovascular;  Laterality: N/A;   CARDIAC CATHETERIZATION N/A 02/23/2015   Procedure: Coronary Stent Intervention;  Surgeon: Ozell Fell, MD;  Location: [facility] INVASIVE CV LAB;  Service: Cardiovascular;  Laterality: N/A;   CARDIAC CATHETERIZATION N/A 01/04/2016   Procedure: Left Heart Cath and Coronary Angiography;  Surgeon: Peter M Jordan, MD;  Location: [facility] INVASIVE CV LAB;  Service: Cardiovascular;  Laterality: N/A;   CATARACT EXTRACTION     CORONARY ANGIOPLASTY  02/23/2015   CORONARY ANGIOPLASTY WITH STENT PLACEMENT  2009   CORONARY STENT INTERVENTION N/A 03/09/2020   Procedure: CORONARY STENT INTERVENTION;  Surgeon: Dann Candyce RAMAN, MD;  Location: [facility] INVASIVE CV LAB;  Service: Cardiovascular;  Laterality: N/A;   DILATION AND CURETTAGE OF  UTERUS     ESOPHAGOGASTRODUODENOSCOPY (EGD) WITH PROPOFOL  N/A 09/21/2021   normal esophagus, abnormal gastric mucosa s/p biopsy, normal duodenum. Pathology with H.pylori. Prescribed Prevpac.   implantable loop recorder placement  04/01/2020   Medtronic Reveal [place] model LNQ [id number]) implantable loop recorder    LEFT HEART CATH AND CORONARY ANGIOGRAPHY N/A 03/09/2020   Procedure: LEFT HEART CATH AND CORONARY ANGIOGRAPHY;  Surgeon: Dann Candyce RAMAN, MD;  Location: [facility] INVASIVE CV LAB;  Service: Cardiovascular;  Laterality: N/A;   TUBAL LIGATION      Social History   Socioeconomic History   Marital status: Widowed    Spouse name: Not on file   Number of children: 8   Years of education: 10th grade   Highest education level: 10th grade  Occupational History   Occupation: RETIRED  Tobacco Use   Smoking status: Never   Smokeless tobacco: Never   Tobacco comments:    Never smoke 11/15/21  Vaping Use   Vaping status: Never Used  Substance and Sexual Activity   Alcohol use: Not Currently    Alcohol/week: 1.0 standard drink of alcohol    Types: 1 Glasses of wine per week    Comment: RARE OCCASION   Drug use: No   Sexual activity: Not Currently  Other Topics Concern   Not on file  Social History Narrative   Lives at home alone.   Right-handed.   No caffeine use.  Daughter lives next door and helps her out   Social Drivers of Health   Financial Resource Strain: Low Risk  (03/05/2023)   Overall Financial Resource Strain (CARDIA)    Difficulty of Paying Living Expenses: Not hard at all  Food Insecurity: No Food Insecurity (03/05/2023)   Hunger Vital Sign    Worried About Running Out of Food in the Last Year: Never true    Ran Out of Food in the Last Year: Never true  Transportation Needs: No Transportation Needs (03/05/2023)   PRAPARE - Administrator, Civil Service (Medical): No    Lack of Transportation (Non-Medical): No  Physical Activity: Inactive  (03/05/2023)   Exercise Vital Sign    Days of Exercise per Week: 0 days    Minutes of Exercise per Session: 0 min  Stress: No Stress Concern Present (03/05/2023)   [name] [employer] - Occupational Stress Questionnaire    Feeling of Stress : Not at all  Social Connections: Moderately Isolated (03/05/2023)   Social Connection and Isolation Panel [NHANES]    Frequency of Communication with Friends and Family: More than three times a week    Frequency of Social Gatherings with Friends and Family: Three times a week    Attends Religious Services: More than 4 times per year    Active Member of Clubs or Organizations: No    Attends Banker Meetings: Never    Marital Status: Widowed  Intimate Partner Violence: Not At Risk (03/05/2023)   Humiliation, Afraid, Rape, and Kick questionnaire    Fear of Current or Ex-Partner: No    Emotionally Abused: No    Physically Abused: No    Sexually Abused: No    Outpatient Encounter Medications as of 03/06/2023  Medication Sig   acetaminophen  (TYLENOL ) 500 MG tablet Take 1,000 mg by mouth every 6 (six) hours as needed for moderate pain.   Ascorbic Acid  (VITAMIN C ) 500 MG tablet Take 500 mg by mouth daily.     bimatoprost (LUMIGAN) 0.01 % SOLN Place 1 drop into both eyes at bedtime.   Calcium  Carbonate Antacid (TUMS PO) Take 2 tablets by mouth 4 (four) times daily as needed (for acid reflux/indigestion).    Camphor-Menthol -Methyl Sal (SALONPAS ) 3.02-26-08 % PTCH Apply 1 patch topically daily as needed (pain).   cetirizine (ZYRTEC) 10 MG tablet Take 10 mg by mouth daily as needed for allergies.   Cyanocobalamin  (VITAMIN B 12 PO) Take 1 tablet by mouth daily.   diclofenac  Sodium (VOLTAREN ) 1 % GEL Apply 2 g topically 4 (four) times daily. (Patient taking differently: Apply 2 g topically 4 (four) times daily as needed (pain).)   diltiazem  (CARDIZEM  CD) 300 MG 24 hr capsule TAKE ONE CAPSULE BY MOUTH DAILY   DULoxetine  (CYMBALTA ) 20  MG capsule TAKE ONE CAPSULE BY MOUTH DAILY   ELIQUIS  5 MG TABS tablet TAKE ONE TABLET BY MOUTH TWICE DAILY   GARLIC  PO Take 1 tablet by mouth daily.   HYDROcodone -acetaminophen  (NORCO) 7.5-325 MG tablet Take 1 tablet by mouth daily as needed for moderate pain.   levothyroxine  (SYNTHROID ) 25 MCG tablet Take 1 tablet (25 mcg total) by mouth daily before breakfast.   losartan  (COZAAR ) 50 MG tablet TAKE ONE (1) TABLET BY MOUTH EVERY DAY   MAGNESIUM  CARBONATE PO Take 1 tablet by mouth at bedtime.   metFORMIN  (GLUCOPHAGE ) 500 MG tablet TAKE 1 TABLET BY MOUTH TWICE DAILY WITH MEALS   metoprolol  succinate (TOPROL -XL) 100 MG 24 hr tablet  TAKE 1 TABLET BY MOUTH DAILY WITH FOOD   nitroGLYCERIN  (NITROSTAT ) 0.4 MG SL tablet DISSOLVE 1 TAB UNDER TOUNGE FOR CHEST PAIN. MAY REPEAT EVERY 5 MINUTES FOR 3 DOSES. IF NO RELIEF CALL 911 OR GO TO ER   rosuvastatin  (CRESTOR ) 20 MG tablet TAKE ONE (1) TABLET BY MOUTH EVERY DAY   timolol  (TIMOPTIC ) 0.5 % ophthalmic solution Place 1 drop into both eyes daily.   traZODone  (DESYREL ) 150 MG tablet Take 1 tablet (150 mg total) by mouth at bedtime.   TURMERIC PO Take 1 tablet by mouth daily.   VENTOLIN  HFA 108 (90 Base) MCG/ACT inhaler Inhale 1-2 puffs into the lungs every 4 (four) hours as needed for shortness of breath.   Vitamin D , Cholecalciferol , 10 MCG (400 UNIT) CAPS Take 400 Units by mouth daily.   [DISCONTINUED] levothyroxine  (SYNTHROID ) 50 MCG tablet Take 1 tablet (50 mcg total) by mouth daily before breakfast.   [DISCONTINUED] losartan  (COZAAR ) 50 MG tablet TAKE ONE (1) TABLET BY MOUTH EVERY DAY   [DISCONTINUED] metFORMIN  (GLUCOPHAGE ) 500 MG tablet Take 1 tablet (500 mg total) by mouth 2 (two) times daily with a meal.   No facility-administered encounter medications on file as of 03/06/2023.    No Known Allergies  Review of Systems As per HPI  Objective:  BP 138/84   Pulse 68   Temp 97.8 F (36.6 C)   Ht 5' 4 (1.626 m)   Wt 151 lb (68.5 kg)   LMP  (LMP  Unknown)   SpO2 96%   BMI 25.92 kg/m    Wt Readings from Last 3 Encounters:  03/06/23 151 lb (68.5 kg)  03/05/23 151 lb (68.5 kg)  12/26/22 151 lb 9.6 oz (68.8 kg)   Physical Exam Constitutional:      General: She is awake. She is not in acute distress.    Appearance: Normal appearance. She is well-developed and well-groomed. She is not ill-appearing, toxic-appearing or diaphoretic.  Cardiovascular:     Rate and Rhythm: Normal rate and regular rhythm.     Pulses: Normal pulses.          Radial pulses are 2+ on the right side and 2+ on the left side.       Posterior tibial pulses are 2+ on the right side and 2+ on the left side.     Heart sounds: Normal heart sounds. No murmur heard.    No gallop.  Pulmonary:     Effort: Pulmonary effort is normal. No respiratory distress.     Breath sounds: Normal breath sounds. No stridor. No wheezing, rhonchi or rales.  Musculoskeletal:     Cervical back: Full passive range of motion without pain and neck supple.     Right lower leg: No edema.     Left lower leg: No edema.     Right foot: Normal range of motion. No deformity, bunion, Charcot foot, foot drop or prominent metatarsal heads.     Left foot: Normal range of motion. No deformity, bunion, Charcot foot, foot drop or prominent metatarsal heads.  Feet:     Right foot:     Protective Sensation: 10 sites tested.  9 sites sensed.     Skin integrity: No ulcer, blister, skin breakdown, erythema, warmth, callus, dry skin or fissure.     Toenail Condition: Right toenails are abnormally thick.     Left foot:     Protective Sensation: 10 sites tested.  10 sites sensed.     Skin integrity: No  ulcer, blister, skin breakdown, erythema, warmth, callus, dry skin or fissure.     Toenail Condition: Left toenails are abnormally thick.  Skin:    General: Skin is warm.     Capillary Refill: Capillary refill takes less than 2 seconds.  Neurological:     General: No focal deficit present.     Mental  Status: She is alert, oriented to person, place, and time and easily aroused. Mental status is at baseline.     GCS: GCS eye subscore is 4. GCS verbal subscore is 5. GCS motor subscore is 6.     Motor: No weakness.  Psychiatric:        Attention and Perception: Attention and perception normal.        Mood and Affect: Mood and affect normal.        Speech: Speech normal.        Behavior: Behavior normal. Behavior is cooperative.        Thought Content: Thought content normal. Thought content does not include homicidal or suicidal ideation. Thought content does not include homicidal or suicidal plan.        Cognition and Memory: Cognition and memory normal.        Judgment: Judgment normal.     Results for orders placed or performed in visit on 02/05/23  CUP PACEART REMOTE DEVICE CHECK   Collection Time: 02/04/23 11:03 PM  Result Value Ref Range   Date Time Interrogation Session [phone]    Pulse Generator Manufacturer MERM    Pulse Gen Model [id number] GARR HEATH    Pulse Gen Serial Number [id number]    Clinic [facility]    Implantable Pulse Generator Type ICM/ILR    Implantable Pulse Generator Implant Date [id number]        03/06/2023   10:30 AM 03/05/2023    3:36 PM 11/30/2022    1:56 PM 08/31/2022    2:27 PM 06/06/2022   11:00 AM  Depression screen PHQ 2/9  Decreased Interest 1 1 1  0 2  Down, Depressed, Hopeless 1 1 1  0 2  PHQ - 2 Score 2 2 2  0 4  Altered sleeping 2 1 1 1 2   Tired, decreased energy 2 1 2 1 2   Change in appetite 3 1 1 1 2   Feeling bad or failure about yourself  1 0  0 1  Trouble concentrating 2 0 0 1 2  Moving slowly or fidgety/restless 0 0 0 0 0  Suicidal thoughts 0 0 0 0 0  PHQ-9 Score 12 5 6 4 13   Difficult doing work/chores Not difficult at all   Somewhat difficult Somewhat difficult       03/06/2023   10:31 AM 11/30/2022    1:57 PM 08/31/2022    2:27 PM 06/06/2022   11:00 AM  GAD 7 : Generalized Anxiety Score  Nervous, Anxious, on Edge 1 1  1 1   Control/stop worrying 2 1 1 1   Worry too much - different things 2 1 1 1   Trouble relaxing 1 1 0 1  Restless 1 0 0 0  Easily annoyed or irritable 1 1 1 1   Afraid - awful might happen 1 0 0 0  Total GAD 7 Score 9 5 4 5   Anxiety Difficulty Somewhat difficult  Not difficult at all Somewhat difficult   Pertinent labs & imaging results that were available during my care of the patient were reviewed by me and considered in my medical decision making.  Assessment &  Plan:  [name] was seen today for medical management of chronic issues.  Diagnoses and all orders for this visit: 1. Type 2 diabetes mellitus with other specified complication, without long-term current use of insulin  (HCC) (Primary) Well controlled. Continue current regimen.  - Bayer DCA Hb A1c Waived  2. Chronic hip pain, bilateral Will refill as below. Would like for patient to consider other options of pain control given age and forgetfulness. Reviewed PDMP. No red flags. Patient to follow up in 3 months. Will update CSA and Toxassure at that time. CSA signed 04/28/22.  - HYDROcodone -acetaminophen  (NORCO) 7.5-325 MG tablet; Take 1 tablet by mouth daily as needed for moderate pain (pain score 4-6).  Dispense: 30 tablet; Refill: 0 - HYDROcodone -acetaminophen  (NORCO) 7.5-325 MG tablet; Take 1 tablet by mouth daily as needed for moderate pain (pain score 4-6).  Dispense: 30 tablet; Refill: 0 - HYDROcodone -acetaminophen  (NORCO) 7.5-325 MG tablet; Take 1 tablet by mouth daily as needed for moderate pain (pain score 4-6).  Dispense: 30 tablet; Refill: 0  3. Controlled substance agreement signed As above.  - HYDROcodone -acetaminophen  (NORCO) 7.5-325 MG tablet; Take 1 tablet by mouth daily as needed for moderate pain (pain score 4-6).  Dispense: 30 tablet; Refill: 0 - HYDROcodone -acetaminophen  (NORCO) 7.5-325 MG tablet; Take 1 tablet by mouth daily as needed for moderate pain (pain score 4-6).  Dispense: 30 tablet; Refill: 0 -  HYDROcodone -acetaminophen  (NORCO) 7.5-325 MG tablet; Take 1 tablet by mouth daily as needed for moderate pain (pain score 4-6).  Dispense: 30 tablet; Refill: 0  4. Depression, recurrent (HCC) Slightly increased scores today during visit. Denies SI. Declines counseling. Continues on cymbalta  and trazodone . Does not wish to change regimen at this time.   5. Forgetfulness Continues to have some forgetfulness. Discussed safety and potential follow up with neurology if desired. Patient does not wish to follow up at this time.   6. Hyperlipidemia associated with type 2 diabetes mellitus (HCC) Well controlled at last labs. Continues to follow with cardiology. Can continue crestor .   7. Hypertension associated with diabetes (HCC) Improved with repeat in office. Would like for patient to keep a log at home. Provided information on monitoring BP at home. Patient to bring measurements to follow up with PCP and Cardiology.   Addendum:  8. Metallic taste Not at goal. Received fax from Neurology on 03/07/2023, Franky JINNY Huger, PA. Patient was to try treatment for thrush and follow up. Will encourage patient to follow up with neurology.    Continue all other maintenance medications.  Follow up plan: Return in about 3 months (around 06/04/2023) for Chronic Condition Follow up/update CSA.   Continue healthy lifestyle choices, including diet (rich in fruits, vegetables, and lean proteins, and low in salt and simple carbohydrates) and exercise (at least 30 minutes of moderate physical activity daily).  Written and verbal instructions provided   The above assessment and management plan was discussed with the patient. The patient verbalized understanding of and has agreed to the management plan. Patient is aware to call the clinic if they develop any new symptoms or if symptoms persist or worsen. Patient is aware when to return to the clinic for a follow-up visit. Patient educated on when it is appropriate  to go to the emergency department.   Marry Kins, DNP-FNP [facility] [address] [place], [place] [address] [phone]

## 2023-03-06 NOTE — Patient Instructions (Addendum)
 Follow up with ENT and send records to [facility] [address] [place] [address] [phone]  Monitoring your BP at home   Your BP was elevated today in office  Please keep a log of your BP at home.  The best time to take BP is 1st thing in the morning after waking.  Sit for 5 minutes with feet flat on the floor, arm at heart level.  Options for BP cuffs are at [employer], [employer], [employer], [facility] & [facility].  We will review measurements at follow up and determine plan for BP management.  If you have access, you can send a message in MyChart with your measurements prior to your follow up appointment.  The brand I recommend to get is Omron (Bronze).

## 2023-03-07 NOTE — Progress Notes (Signed)
 Well controlled. Reviewed with patient in office. Continue current medications.

## 2023-03-12 ENCOUNTER — Ambulatory Visit (INDEPENDENT_AMBULATORY_CARE_PROVIDER_SITE_OTHER): Payer: 59

## 2023-03-12 ENCOUNTER — Other Ambulatory Visit: Payer: Self-pay | Admitting: Cardiology

## 2023-03-12 ENCOUNTER — Other Ambulatory Visit: Payer: Self-pay | Admitting: Family Medicine

## 2023-03-12 DIAGNOSIS — I4891 Unspecified atrial fibrillation: Secondary | ICD-10-CM

## 2023-03-12 DIAGNOSIS — F418 Other specified anxiety disorders: Secondary | ICD-10-CM

## 2023-03-12 DIAGNOSIS — F339 Major depressive disorder, recurrent, unspecified: Secondary | ICD-10-CM

## 2023-03-12 LAB — CUP PACEART REMOTE DEVICE CHECK
Date Time Interrogation Session: 20250119230756
Implantable Pulse Generator Implant Date: 20220210

## 2023-03-12 NOTE — Telephone Encounter (Signed)
Prescription refill request for Eliquis received. Indication: AF Last office visit: 12/26/22  Ival Bible MD Scr: 0.69 on 11/30/22  Epic Age: 83 Weight: 68.8kg  Based on above findings Eliquis 5mg  twice daily is the appropriate dose.  Refill approved.

## 2023-03-13 ENCOUNTER — Other Ambulatory Visit: Payer: Self-pay | Admitting: *Deleted

## 2023-03-13 NOTE — Patient Outreach (Signed)
Care Management   Visit Note  03/13/2023 Name: [name] MRN: [id number] DOB: 10/12/1940  Subjective: [name] is a 83 y.o. year old female who is a primary care patient of Ellamae Sia, Aleen Campi, FNP. The Care Management team was consulted for assistance.      Engaged with patient spoke with patient by telephone.    Goals Addressed             This Visit's Progress    COMPLETED: RNCM Care Management  (ATRIAL FIBRILLATION) EXPECTED OUTCOME: MONITOR, SELF-MANAGE AND REDUCE SYMPTOMS OF ATRIAL FIBRILLATION       Current Barriers:  Knowledge Deficits related to Atrial fibrillation management Chronic Disease Management support and education needs related to Atrial fibrillation Patient reports she has all medications and taking as prescribed including blood thinner Patient reports she has a cane and uses when needed   Planned Interventions: Counseled on increased risk of stroke due to Afib and benefits of anticoagulation for stroke prevention. Saw the cardiologist recently and got a good report. Her blood pressures were up a little when she arrived but went down after being their for a little while. Discussed white coat syndrome. She states that over all she feels like she is doing well. She does have more stress, depression, and anxiety due to her son moving back home with her two grandchildren. She states it can be very stressful on her. She is not sleeping but she says she feels this is also in part with her trazodone being discontinued. In basket message sent to the pcp to ask for recommendations. Will follow up accordingly with the patient after discussion with the pcp.            Reviewed importance of adherence to anticoagulant exactly as prescribed. States compliance with medications.  Advised patient to discuss issues with heart rate, exacerbation of A-fib with provider Counseled on importance of regular laboratory monitoring as prescribed Afib action plan  reviewed Screening for signs and symptoms of depression related to chronic disease state Assessed social determinant of health barriers Reviewed upcoming scheduled appointments. Sees cardiologist in the new year Atrial fibrillation action plan reinforced Has a life alert system, review of safety precautions related to falls and increased risk of bleeding.   Symptom Management: Take medications as prescribed   Attend all scheduled provider appointments Call pharmacy for medication refills 3-7 days in advance of running out of medications Attend church or other social activities Perform all self care activities independently  Call provider office for new concerns or questions  - check pulse (heart) rate before taking medicine - make a plan to exercise regularly - make a plan to eat healthy - keep all lab appointments - take medicine as prescribed - wear medical alert identification Follow Atrial fibrillation action plan- call your doctor as needed for symptom management  Follow Up Plan: Telephone follow up appointment with care management team member scheduled for:  03-13-2023 at 345 pm       COMPLETED: RNCM Care Management  (DIABETES) EXPECTED OUTCOME: MONITOR, SELF- MANAGE AND REDUCE SYMPTOMS OF DIABETES       Current Barriers:  Knowledge Deficits related to Diabetes management Care Coordination needs related to medication management in a patient with Diabetes Chronic Disease Management support and education needs related to Diabetes, diet Patient reports she lives with her son and 2 grands  has lots of support from her adult daughter and family, pt has never driven and daughter transports pt to all appointments  and running errands, etc. Patient reports she used to check CBG years ago and not interested in checking now, does not like needles, states she was taken off metformin and was no longer on any medication for diabetes, has now been placed back on metformin, AIC increased to 8.2  up from 6.4 and 7.4 over past 2 years Patient reports her granddaughter is willing to check CBG and pt sees her frequently, pt states she has not started checking CBG, still not interested in at present Lab Results  Component Value Date   HGBA1C 5.9 (H) 03/06/2023   Previous was 8.2  Planned Interventions: Reviewed medications with patient and discussed importance of medication adherence. The patient states compliance in medications;        Counseled on importance of regular laboratory monitoring as prescribed. The patient has regular labs ;        Discussed plans with patient for ongoing care management follow up and provided patient with direct contact information for care management team;      Advised patient, providing education and rationale, to check cbg per doctor's order  and record        call provider for findings outside established parameters;       Review of patient status, including review of consultants reports, relevant laboratory and other test results, and medications completed;       The patient states she is happy with her blood sugars being better. Denies any acute changes in her DM today. Will continue to monitor.  Reviewed carbohydrate modified diet  Symptom Management: Take medications as prescribed   Attend all scheduled provider appointments Call pharmacy for medication refills 3-7 days in advance of running out of medications Attend church or other social activities Perform all self care activities independently  Call provider office for new concerns or questions  check blood sugar at prescribed times: per doctor's order  check feet daily for cuts, sores or redness enter blood sugar readings and medication or insulin into daily log take the blood sugar log to all doctor visits take the blood sugar meter to all doctor visits trim toenails straight across eat fish at least once per week fill half of plate with vegetables limit fast food meals to no more than 1  per week manage portion size prepare main meal at home 3 to 5 days each week read food labels for fat, fiber, carbohydrates and portion size set a realistic goal wash and dry feet carefully every day wear comfortable, cotton socks wear comfortable, well-fitting shoes Consider having your granddaughter check your blood sugar  Follow Up Plan: Telephone follow up appointment with care management team member scheduled for:   03-13-2023 at 345 pm       RNCM Care Management Expected Outcomes: Monitor, Self-Manage and Reduce Symptoms of: HTN, Afib, DM       Current Barriers:  Knowledge Deficits related to plan of care for management of Atrial Fibrillation, HTN, and DMII  Chronic Disease Management support and education needs related to Atrial Fibrillation, HTN, and DMII   RNCM Clinical Goal(s):  Patient will verbalize basic understanding of  Atrial Fibrillation, HTN, and DMII disease process and self health management plan as evidenced by verbal explanation, recognizing symptoms, lifestyle modifications attend all scheduled medical appointments: with primary care provider and specialists as evidenced by keeping all scheduled appointments demonstrate Improved and Ongoing adherence to prescribed treatment plan for Atrial Fibrillation, HTN, and DMII as evidenced by consistent medication compliance, symptom monitoring, continued lifestyle  modifications continue to work with RN Care Manager to address care management and care coordination needs related to  Atrial Fibrillation, HTN, and DMII as evidenced by adherence to CM Team Scheduled appointments through collaboration with RN Care manager, provider, and care team.   Interventions: Evaluation of current treatment plan related to  self management and patient's adherence to plan as established by provider   AFIB Interventions: (Status:  Condition stable.  Not addressed this visit.) Long Term Goal Counseled on increased risk of stroke due to Afib and  benefits of anticoagulation for stroke prevention Reviewed importance of adherence to anticoagulant exactly as prescribed Advised patient to discuss any  new changes or falls with injury with provider Counseled on bleeding risk associated with Eliquis and importance of self-monitoring for signs/symptoms of bleeding Counseled on avoidance of NSAIDs due to increased bleeding risk with anticoagulants Counseled on importance of regular laboratory monitoring as prescribed Counseled on seeking medical attention after a head injury or if there is blood in the urine/stool Afib action plan reviewed Screening for signs and symptoms of depression related to chronic disease state  Assessed social determinant of health barriers   Diabetes Interventions:  (Status:  Goal on track:  Yes.) Long Term Goal Assessed patient's understanding of A1c goal: <6.5% Provided education to patient about basic DM disease process Reviewed medications with patient and discussed importance of medication adherence Counseled on importance of regular laboratory monitoring as prescribed Discussed plans with patient for ongoing care management follow up and provided patient with direct contact information for care management team Provided patient with written educational materials related to hypo and hyperglycemia and importance of correct treatment Reviewed scheduled/upcoming provider appointments including: 03-27-2023 with Rheumatology, 06-05-2023 with PCP Advised patient, providing education and rationale, to check cbg as recommended and record, calling provider for findings outside established parameters Review of patient status, including review of consultants reports, relevant laboratory and other test results, and medications completed Screening for signs and symptoms of depression related to chronic disease state  Assessed social determinant of health barriers Lab Results  Component Value Date   HGBA1C 5.9 (H) 03/06/2023     Hypertension Interventions:  (Status:  Goal on track:  Yes.) Long Term Goal Last practice recorded BP readings:  BP Readings from Last 3 Encounters:  03/06/23 138/84  12/26/22 (!) 122/58  12/19/22 (!) 140/74   Most recent eGFR/CrCl:  Lab Results  Component Value Date   EGFR 87 11/30/2022    No components found for: "CRCL"  Evaluation of current treatment plan related to hypertension self management and patient's adherence to plan as established by provider. Does not check BP at home but states she has a BP cuff at home. Patient stated she was advised by provider to check BP daily. RNCM spoke with patient about the importance of regularly checking her blood pressure and when to notify PCP. RNCM discussed with patient goals for SBP<140 DBP<90. RNCM advised that if daily checks are  overwhelming to start checking it 3 times per week and keeping a log for the provider. Patient verbalized full understanding. Provided education to patient re: stroke prevention, s/s of heart attack and stroke Reviewed medications with patient and discussed importance of compliance. Reports compliance with all medications. Counseled on adverse effects of illicit drug and excessive alcohol use in patients with high blood pressure  Counseled on the importance of exercise goals with target of 150 minutes per week Discussed plans with patient for ongoing care management follow up and provided  patient with direct contact information for care management team Advised patient, providing education and rationale, to monitor blood pressure daily and record, calling PCP for findings outside established parameters Reviewed scheduled/upcoming provider appointments including: 03-27-2023 with Rheumatology, 06-05-2023 with PCP Advised patient to discuss acute changes with provider Provided education on prescribed diet Heart Healthy/ADA Discussed complications of poorly controlled blood pressure such as heart disease, stroke,  circulatory complications, vision complications, kidney impairment, sexual dysfunction Screening for signs and symptoms of depression related to chronic disease state  Assessed social determinant of health barriers  Patient Goals/Self-Care Activities: Take all medications as prescribed Attend all scheduled provider appointments Call pharmacy for medication refills 3-7 days in advance of running out of medications Attend church or other social activities Perform all self care activities independently  Perform IADL's (shopping, preparing meals, housekeeping, managing finances) independently Call provider office for new concerns or questions  schedule appointment with eye doctor check feet daily for cuts, sores or redness take medicine as prescribed check blood pressure daily write blood pressure results in a log or diary take blood pressure log to all doctor appointments  Follow Up Plan:  Telephone follow up appointment with care management team member scheduled for:  04-11-2023 at 11:45 am            Consent to Services:  Patient was given information about care management services, agreed to services, and gave verbal consent to participate.   Plan: Telephone follow up appointment with care management team member scheduled for:04-11-2023 at 11:45 am  Rosalene Billings, BSN RN [facility], [facility] RN Care Manager Direct Dial: [phone]  Fax: [phone]

## 2023-03-13 NOTE — Patient Instructions (Signed)
Visit Information  Thank you for taking time to visit with me today. Please don't hesitate to contact me if I can be of assistance to you before our next scheduled telephone appointment.  Following are the goals we discussed today:   Goals Addressed             This Visit's Progress    COMPLETED: RNCM Care Management  (ATRIAL FIBRILLATION) EXPECTED OUTCOME: MONITOR, SELF-MANAGE AND REDUCE SYMPTOMS OF ATRIAL FIBRILLATION       Current Barriers:  Knowledge Deficits related to Atrial fibrillation management Chronic Disease Management support and education needs related to Atrial fibrillation Patient reports she has all medications and taking as prescribed including blood thinner Patient reports she has a cane and uses when needed   Planned Interventions: Counseled on increased risk of stroke due to Afib and benefits of anticoagulation for stroke prevention. Saw the cardiologist recently and got a good report. Her blood pressures were up a little when she arrived but went down after being their for a little while. Discussed white coat syndrome. She states that over all she feels like she is doing well. She does have more stress, depression, and anxiety due to her son moving back home with her two grandchildren. She states it can be very stressful on her. She is not sleeping but she says she feels this is also in part with her trazodone being discontinued. In basket message sent to the pcp to ask for recommendations. Will follow up accordingly with the patient after discussion with the pcp.            Reviewed importance of adherence to anticoagulant exactly as prescribed. States compliance with medications.  Advised patient to discuss issues with heart rate, exacerbation of A-fib with provider Counseled on importance of regular laboratory monitoring as prescribed Afib action plan reviewed Screening for signs and symptoms of depression related to chronic disease state Assessed social determinant  of health barriers Reviewed upcoming scheduled appointments. Sees cardiologist in the new year Atrial fibrillation action plan reinforced Has a life alert system, review of safety precautions related to falls and increased risk of bleeding.   Symptom Management: Take medications as prescribed   Attend all scheduled provider appointments Call pharmacy for medication refills 3-7 days in advance of running out of medications Attend church or other social activities Perform all self care activities independently  Call provider office for new concerns or questions  - check pulse (heart) rate before taking medicine - make a plan to exercise regularly - make a plan to eat healthy - keep all lab appointments - take medicine as prescribed - wear medical alert identification Follow Atrial fibrillation action plan- call your doctor as needed for symptom management  Follow Up Plan: Telephone follow up appointment with care management team member scheduled for:  03-13-2023 at 345 pm       COMPLETED: RNCM Care Management  (DIABETES) EXPECTED OUTCOME: MONITOR, SELF- MANAGE AND REDUCE SYMPTOMS OF DIABETES       Current Barriers:  Knowledge Deficits related to Diabetes management Care Coordination needs related to medication management in a patient with Diabetes Chronic Disease Management support and education needs related to Diabetes, diet Patient reports she lives with her son and 2 grands  has lots of support from her adult daughter and family, pt has never driven and daughter transports pt to all appointments and running errands, etc. Patient reports she used to check CBG years ago and not interested in checking now, does  not like needles, states she was taken off metformin and was no longer on any medication for diabetes, has now been placed back on metformin, AIC increased to 8.2 up from 6.4 and 7.4 over past 2 years Patient reports her granddaughter is willing to check CBG and pt sees her  frequently, pt states she has not started checking CBG, still not interested in at present Lab Results  Component Value Date   HGBA1C 5.9 (H) 03/06/2023   Previous was 8.2  Planned Interventions: Reviewed medications with patient and discussed importance of medication adherence. The patient states compliance in medications;        Counseled on importance of regular laboratory monitoring as prescribed. The patient has regular labs ;        Discussed plans with patient for ongoing care management follow up and provided patient with direct contact information for care management team;      Advised patient, providing education and rationale, to check cbg per doctor's order  and record        call provider for findings outside established parameters;       Review of patient status, including review of consultants reports, relevant laboratory and other test results, and medications completed;       The patient states she is happy with her blood sugars being better. Denies any acute changes in her DM today. Will continue to monitor.  Reviewed carbohydrate modified diet  Symptom Management: Take medications as prescribed   Attend all scheduled provider appointments Call pharmacy for medication refills 3-7 days in advance of running out of medications Attend church or other social activities Perform all self care activities independently  Call provider office for new concerns or questions  check blood sugar at prescribed times: per doctor's order  check feet daily for cuts, sores or redness enter blood sugar readings and medication or insulin into daily log take the blood sugar log to all doctor visits take the blood sugar meter to all doctor visits trim toenails straight across eat fish at least once per week fill half of plate with vegetables limit fast food meals to no more than 1 per week manage portion size prepare main meal at home 3 to 5 days each week read food labels for fat, fiber,  carbohydrates and portion size set a realistic goal wash and dry feet carefully every day wear comfortable, cotton socks wear comfortable, well-fitting shoes Consider having your granddaughter check your blood sugar  Follow Up Plan: Telephone follow up appointment with care management team member scheduled for:   03-13-2023 at 345 pm       RNCM Care Management Expected Outcomes: Monitor, Self-Manage and Reduce Symptoms of: HTN, Afib, DM       Current Barriers:  Knowledge Deficits related to plan of care for management of Atrial Fibrillation, HTN, and DMII  Chronic Disease Management support and education needs related to Atrial Fibrillation, HTN, and DMII   RNCM Clinical Goal(s):  Patient will verbalize basic understanding of  Atrial Fibrillation, HTN, and DMII disease process and self health management plan as evidenced by verbal explanation, recognizing symptoms, lifestyle modifications attend all scheduled medical appointments: with primary care provider and specialists as evidenced by keeping all scheduled appointments demonstrate Improved and Ongoing adherence to prescribed treatment plan for Atrial Fibrillation, HTN, and DMII as evidenced by consistent medication compliance, symptom monitoring, continued lifestyle modifications continue to work with RN Care Manager to address care management and care coordination needs related to  Atrial  Fibrillation, HTN, and DMII as evidenced by adherence to CM Team Scheduled appointments through collaboration with RN Care manager, provider, and care team.   Interventions: Evaluation of current treatment plan related to  self management and patient's adherence to plan as established by provider   AFIB Interventions: (Status:  Condition stable.  Not addressed this visit.) Long Term Goal Counseled on increased risk of stroke due to Afib and benefits of anticoagulation for stroke prevention Reviewed importance of adherence to anticoagulant exactly as  prescribed Advised patient to discuss any  new changes or falls with injury with provider Counseled on bleeding risk associated with Eliquis and importance of self-monitoring for signs/symptoms of bleeding Counseled on avoidance of NSAIDs due to increased bleeding risk with anticoagulants Counseled on importance of regular laboratory monitoring as prescribed Counseled on seeking medical attention after a head injury or if there is blood in the urine/stool Afib action plan reviewed Screening for signs and symptoms of depression related to chronic disease state  Assessed social determinant of health barriers   Diabetes Interventions:  (Status:  Goal on track:  Yes.) Long Term Goal Assessed patient's understanding of A1c goal: <6.5% Provided education to patient about basic DM disease process Reviewed medications with patient and discussed importance of medication adherence Counseled on importance of regular laboratory monitoring as prescribed Discussed plans with patient for ongoing care management follow up and provided patient with direct contact information for care management team Provided patient with written educational materials related to hypo and hyperglycemia and importance of correct treatment Reviewed scheduled/upcoming provider appointments including: 03-27-2023 with Rheumatology, 06-05-2023 with PCP Advised patient, providing education and rationale, to check cbg as recommended and record, calling provider for findings outside established parameters Review of patient status, including review of consultants reports, relevant laboratory and other test results, and medications completed Screening for signs and symptoms of depression related to chronic disease state  Assessed social determinant of health barriers Lab Results  Component Value Date   HGBA1C 5.9 (H) 03/06/2023    Hypertension Interventions:  (Status:  Goal on track:  Yes.) Long Term Goal Last practice recorded BP  readings:  BP Readings from Last 3 Encounters:  03/06/23 138/84  12/26/22 (!) 122/58  12/19/22 (!) 140/74   Most recent eGFR/CrCl:  Lab Results  Component Value Date   EGFR 87 11/30/2022    No components found for: "CRCL"  Evaluation of current treatment plan related to hypertension self management and patient's adherence to plan as established by provider. Does not check BP at home but states she has a BP cuff at home. Patient stated she was advised by provider to check BP daily. RNCM spoke with patient about the importance of regularly checking her blood pressure and when to notify PCP. RNCM discussed with patient goals for SBP<140 DBP<90. RNCM advised that if daily checks are  overwhelming to start checking it 3 times per week and keeping a log for the provider. Patient verbalized full understanding. Provided education to patient re: stroke prevention, s/s of heart attack and stroke Reviewed medications with patient and discussed importance of compliance. Reports compliance with all medications. Counseled on adverse effects of illicit drug and excessive alcohol use in patients with high blood pressure  Counseled on the importance of exercise goals with target of 150 minutes per week Discussed plans with patient for ongoing care management follow up and provided patient with direct contact information for care management team Advised patient, providing education and rationale, to monitor blood pressure daily  and record, calling PCP for findings outside established parameters Reviewed scheduled/upcoming provider appointments including: 03-27-2023 with Rheumatology, 06-05-2023 with PCP Advised patient to discuss acute changes with provider Provided education on prescribed diet Heart Healthy/ADA Discussed complications of poorly controlled blood pressure such as heart disease, stroke, circulatory complications, vision complications, kidney impairment, sexual dysfunction Screening for signs and  symptoms of depression related to chronic disease state  Assessed social determinant of health barriers  Patient Goals/Self-Care Activities: Take all medications as prescribed Attend all scheduled provider appointments Call pharmacy for medication refills 3-7 days in advance of running out of medications Attend church or other social activities Perform all self care activities independently  Perform IADL's (shopping, preparing meals, housekeeping, managing finances) independently Call provider office for new concerns or questions  schedule appointment with eye doctor check feet daily for cuts, sores or redness take medicine as prescribed check blood pressure daily write blood pressure results in a log or diary take blood pressure log to all doctor appointments  Follow Up Plan:  Telephone follow up appointment with care management team member scheduled for:  04-11-2023 at 11:45 am           Our next appointment is by telephone on 04-11-2023 at 11:45 am  Please call the care guide team at [phone] if you need to cancel or reschedule your appointment.   If you are experiencing a Mental Health or Behavioral Health Crisis or need someone to talk to, please call the Suicide and Crisis Lifeline: 988 call the [place] National Suicide Prevention Lifeline: 919-007-0149 or TTY: 615-402-0826 TTY 660-432-8065) to talk to a trained counselor call 1-800-273-TALK (toll free, 24 hour hotline) call the [facility]: (343)546-4320   Patient verbalizes understanding of instructions and care plan provided today and agrees to view in MyChart. Active MyChart status and patient understanding of how to access instructions and care plan via MyChart confirmed with patient.     Telephone follow up appointment with care management team member scheduled for:04-11-2023 at 11:45 am  Rosalene Billings, BSN RN [facility], [facility] RN Care Manager Direct Dial:  [phone]  Fax: [phone]

## 2023-03-13 NOTE — Progress Notes (Signed)
Carelink Summary Report / Loop Recorder 

## 2023-03-17 ENCOUNTER — Encounter: Payer: Self-pay | Admitting: Cardiovascular Disease

## 2023-03-19 ENCOUNTER — Other Ambulatory Visit (HOSPITAL_COMMUNITY)
Admission: RE | Admit: 2023-03-19 | Discharge: 2023-03-19 | Disposition: A | Discharge status: Home / Self Care | Payer: 59 | Source: Ambulatory Visit | Attending: Nurse Practitioner | Admitting: Nurse Practitioner

## 2023-03-19 DIAGNOSIS — E032 Hypothyroidism due to medicaments and other exogenous substances: Secondary | ICD-10-CM | POA: Insufficient documentation

## 2023-03-19 LAB — T4, FREE: Free T4: 1.09 ng/dL (ref 0.61–1.12)

## 2023-03-19 LAB — TSH: TSH: 0.463 u[IU]/mL (ref 0.350–4.500)

## 2023-03-21 DIAGNOSIS — R202 Paresthesia of skin: Secondary | ICD-10-CM | POA: Diagnosis not present

## 2023-03-21 DIAGNOSIS — R519 Headache, unspecified: Secondary | ICD-10-CM | POA: Diagnosis not present

## 2023-03-26 ENCOUNTER — Telehealth: Payer: Self-pay | Admitting: *Deleted

## 2023-03-26 NOTE — Telephone Encounter (Signed)
Patient left a voicemail, She has appointment for tomorrow, February 4,2025. She is not feeling well and she would like to cancel and make another appointment for a later date. Call back [phone].

## 2023-03-27 ENCOUNTER — Ambulatory Visit: Payer: 59 | Admitting: Nurse Practitioner

## 2023-04-03 DIAGNOSIS — H401131 Primary open-angle glaucoma, bilateral, mild stage: Secondary | ICD-10-CM | POA: Diagnosis not present

## 2023-04-04 ENCOUNTER — Ambulatory Visit: Payer: 59 | Admitting: Nurse Practitioner

## 2023-04-05 ENCOUNTER — Other Ambulatory Visit: Payer: Self-pay | Admitting: Cardiology

## 2023-04-05 ENCOUNTER — Other Ambulatory Visit: Payer: Self-pay | Admitting: Nurse Practitioner

## 2023-04-05 DIAGNOSIS — E032 Hypothyroidism due to medicaments and other exogenous substances: Secondary | ICD-10-CM

## 2023-04-10 ENCOUNTER — Encounter: Payer: Self-pay | Admitting: Nurse Practitioner

## 2023-04-10 ENCOUNTER — Ambulatory Visit (INDEPENDENT_AMBULATORY_CARE_PROVIDER_SITE_OTHER): Payer: 59 | Admitting: Nurse Practitioner

## 2023-04-10 VITALS — BP 110/60 | HR 86 | Ht 64.0 in | Wt 148.6 lb

## 2023-04-10 DIAGNOSIS — E032 Hypothyroidism due to medicaments and other exogenous substances: Secondary | ICD-10-CM

## 2023-04-10 DIAGNOSIS — E042 Nontoxic multinodular goiter: Secondary | ICD-10-CM

## 2023-04-10 MED ORDER — LEVOTHYROXINE SODIUM 25 MCG PO TABS: 25.0000 ug | ORAL_TABLET | Freq: Every day | ORAL | 3 refills | Status: DC

## 2023-04-10 NOTE — Patient Instructions (Signed)

## 2023-04-10 NOTE — Progress Notes (Signed)
04/10/2023, 3:26 PM                          Endocrinology Follow Up Visit   Subjective:    Patient ID: [name], female    DOB: September 15, 1940, PCP Arrie Senate, FNP   Past Medical History:  Diagnosis Date   Anxiety    Arthritis    Bulging lumbar disc    Chronic lower back pain    Coronary atherosclerosis of native coronary artery    DES LAD 02/2008, DES LAD 02/2015, DES ramus and DES x2 mid RCA 02/2020   Depression    Dyslipidemia    Essential hypertension    Facial numbness    Frequent headaches    GERD (gastroesophageal reflux disease)    Glaucoma    Heart attack (HCC) 02/2020   Insomnia    Paroxysmal atrial fibrillation (HCC)    Diagnosed October 2019   PSVT (paroxysmal supraventricular tachycardia) (HCC)    Refusal of blood transfusions as patient is Jehovah's Witness    Thyroid disease    Type 2 diabetes mellitus (HCC)    Borderline   Vitamin D deficiency    Past Surgical History:  Procedure Laterality Date   ATRIAL FIBRILLATION ABLATION N/A 02/16/2021   Procedure: ATRIAL FIBRILLATION ABLATION;  Surgeon: Hillis Range, MD;  Location: [facility] INVASIVE CV LAB;  Service: Cardiovascular;  Laterality: N/A;   BIOPSY  09/21/2021   Procedure: BIOPSY;  Surgeon: Corbin Ade, MD;  Location: [facility] ENDO SU[facility];  Service: Endoscopy;;  gastric   CARDIAC CATHETERIZATION N/A 02/23/2015   Procedure: Left Heart Cath and Coronary Angiography;  Surgeon: Tonny Bollman, MD;  Location: [facility] INVASIVE CV LAB;  Service: Cardiovascular;  Laterality: N/A;   CARDIAC CATHETERIZATION N/A 02/23/2015   Procedure: Coronary Stent Intervention;  Surgeon: Tonny Bollman, MD;  Location: [facility] INVASIVE CV LAB;  Service: Cardiovascular;  Laterality: N/A;   CARDIAC CATHETERIZATION N/A 01/04/2016   Procedure: Left Heart Cath and Coronary Angiography;  Surgeon: Peter M Swaziland, MD;  Location: [facility] INVASIVE CV LAB;  Service: Cardiovascular;  Laterality: N/A;    CATARACT EXTRACTION     CORONARY ANGIOPLASTY  02/23/2015   CORONARY ANGIOPLASTY WITH STENT PLACEMENT  2009   CORONARY STENT INTERVENTION N/A 03/09/2020   Procedure: CORONARY STENT INTERVENTION;  Surgeon: Corky Crafts, MD;  Location: [facility] INVASIVE CV LAB;  Service: Cardiovascular;  Laterality: N/A;   DILATION AND CURETTAGE OF UTERUS     ESOPHAGOGASTRODUODENOSCOPY (EGD) WITH PROPOFOL N/A 09/21/2021   normal esophagus, abnormal gastric mucosa s/p biopsy, normal duodenum. Pathology with H.pylori. Prescribed Prevpac.   implantable loop recorder placement  04/01/2020   Medtronic Reveal Marion model LNQ 22 [id number]) implantable loop recorder    LEFT HEART CATH AND CORONARY ANGIOGRAPHY N/A 03/09/2020   Procedure: LEFT HEART CATH AND CORONARY ANGIOGRAPHY;  Surgeon: Corky Crafts, MD;  Location: [facility] INVASIVE CV LAB;  Service: Cardiovascular;  Laterality: N/A;   TUBAL LIGATION     Social History   Socioeconomic History   Marital status: Widowed    Spouse name: Not on file   Number of children: 8   Years of education: 10th grade   Highest education level: 10th grade  Occupational History   Occupation: RETIRED  Tobacco Use   Smoking status: Never   Smokeless tobacco: Never   Tobacco comments:    Never smoke 11/15/21  Vaping Use   Vaping status: Never Used  Substance and Sexual Activity   Alcohol use: Not Currently    Alcohol/week: 1.0 standard drink of alcohol    Types: 1 Glasses of wine per week    Comment: RARE OCCASION   Drug use: No   Sexual activity: Not Currently  Other Topics Concern   Not on file  Social History Narrative   Lives at home alone.   Right-handed.   No caffeine use.   Daughter lives next door and helps her out   Social Drivers of Health   Financial Resource Strain: Low Risk  (03/21/2023)   Received from [facility]   Overall Financial Resource Strain (CARDIA)    Difficulty of Paying Living Expenses: Not very hard  Food Insecurity: No Food  Insecurity (03/21/2023)   Received from [facility]   Hunger Vital Sign    Worried About Running Out of Food in the Last Year: Never true    Ran Out of Food in the Last Year: Never true  Transportation Needs: No Transportation Needs (03/21/2023)   Received from [facility] - Transportation    Lack of Transportation (Medical): No    Lack of Transportation (Non-Medical): No  Physical Activity: Inactive (03/05/2023)   Exercise Vital Sign    Days of Exercise per Week: 0 days    Minutes of Exercise per Session: 0 min  Stress: No Stress Concern Present (03/05/2023)   [employer] of Occupational Health - Occupational Stress Questionnaire    Feeling of Stress : Not at all  Social Connections: Moderately Isolated (03/05/2023)   Social Connection and Isolation Panel [NHANES]    Frequency of Communication with Friends and Family: More than three times a week    Frequency of Social Gatherings with Friends and Family: Three times a week    Attends Religious Services: More than 4 times per year    Active Member of Clubs or Organizations: No    Attends Banker Meetings: Never    Marital Status: Widowed   Family History  Problem Relation Age of Onset   Other Mother        "natural causes"   Heart disease Mother    Cancer Father        unsure of type   Colon cancer Daughter        2011   Outpatient Encounter Medications as of 04/10/2023  Medication Sig   acetaminophen (TYLENOL) 500 MG tablet Take 1,000 mg by mouth every 6 (six) hours as needed for moderate pain.   Ascorbic Acid (VITAMIN C) 500 MG tablet Take 500 mg by mouth daily.     bimatoprost (LUMIGAN) 0.01 % SOLN Place 1 drop into both eyes at bedtime.   Calcium Carbonate Antacid (TUMS PO) Take 2 tablets by mouth 4 (four) times daily as needed (for acid reflux/indigestion).    Camphor-Menthol-Methyl Sal (SALONPAS) 3.02-26-08 % PTCH Apply 1 patch topically daily as needed (pain).   cetirizine (ZYRTEC) 10 MG  tablet Take 10 mg by mouth daily as needed for allergies.   Cyanocobalamin (VITAMIN B 12 PO) Take 1 tablet by mouth daily.   diclofenac Sodium (VOLTAREN) 1 % GEL Apply 2 g topically 4 (four) times daily. (Patient taking differently: Apply 2 g topically 4 (four) times daily as needed (  pain).)   diltiazem (CARDIZEM CD) 300 MG 24 hr capsule TAKE ONE CAPSULE BY MOUTH DAILY   DULoxetine (CYMBALTA) 30 MG capsule Take 30 mg by mouth daily.   ELIQUIS 5 MG TABS tablet TAKE ONE TABLET BY MOUTH TWICE DAILY   GARLIC PO Take 1 tablet by mouth daily.   HYDROcodone-acetaminophen (NORCO) 7.5-325 MG tablet Take 1 tablet by mouth daily as needed for moderate pain (pain score 4-6).   [START ON 04/22/2023] HYDROcodone-acetaminophen (NORCO) 7.5-325 MG tablet Take 1 tablet by mouth daily as needed for moderate pain (pain score 4-6).   [START ON 05/22/2023] HYDROcodone-acetaminophen (NORCO) 7.5-325 MG tablet Take 1 tablet by mouth daily as needed for moderate pain (pain score 4-6).   losartan (COZAAR) 50 MG tablet TAKE ONE (1) TABLET BY MOUTH EVERY DAY   MAGNESIUM CARBONATE PO Take 1 tablet by mouth at bedtime.   metFORMIN (GLUCOPHAGE) 500 MG tablet TAKE 1 TABLET BY MOUTH TWICE DAILY WITH MEALS   metoprolol succinate (TOPROL-XL) 100 MG 24 hr tablet TAKE 1 TABLET BY MOUTH DAILY WITH FOOD   nitroGLYCERIN (NITROSTAT) 0.4 MG SL tablet DISSOLVE 1 TAB UNDER TOUNGE FOR CHEST PAIN. MAY REPEAT EVERY 5 MINUTES FOR 3 DOSES. IF NO RELIEF CALL 911 OR GO TO ER   rosuvastatin (CRESTOR) 20 MG tablet TAKE ONE (1) TABLET BY MOUTH EVERY DAY   timolol (TIMOPTIC) 0.5 % ophthalmic solution Place 1 drop into both eyes daily.   traZODone (DESYREL) 150 MG tablet Take 1 tablet (150 mg total) by mouth at bedtime.   TURMERIC PO Take 1 tablet by mouth daily.   VENTOLIN HFA 108 (90 Base) MCG/ACT inhaler Inhale 1-2 puffs into the lungs every 4 (four) hours as needed for shortness of breath.   Vitamin D, Cholecalciferol, 10 MCG (400 UNIT) CAPS Take 400  Units by mouth daily.   [DISCONTINUED] levothyroxine (SYNTHROID) 25 MCG tablet TAKE 1 TABLET BY MOUTH EVERY MORNING BEFORE BREAKFAST   DULoxetine (CYMBALTA) 20 MG capsule TAKE ONE CAPSULE BY MOUTH DAILY (Patient not taking: Reported on 04/10/2023)   levothyroxine (SYNTHROID) 25 MCG tablet Take 1 tablet (25 mcg total) by mouth daily before breakfast.   No facility-administered encounter medications on file as of 04/10/2023.   ALLERGIES: No Known Allergies  VACCINATION STATUS: Immunization History  Administered Date(s) Administered   Fluad Quad(high Dose 65+) 11/20/2019, 12/03/2020, 12/16/2021   Fluad Trivalent(High Dose 65+) 11/30/2022   Influenza Split 12/05/2005, 12/03/2006   Influenza, High Dose Seasonal PF 12/25/2016, 01/07/2018   Influenza,inj,quad, With Preservative 12/03/2015   Influenza-Unspecified 01/30/1995, 12/13/2000, 03/16/2003, 01/21/2015, 12/25/2016, 01/07/2018, 11/21/2018   Moderna Covid-19 Fall Seasonal Vaccine 2yrs & older 11/17/2021   Moderna Covid-19 Vaccine Bivalent Booster 59yrs & up 01/18/2021   Moderna SARS-COV2 Booster Vaccination 12/31/2019, 07/04/2020   Moderna Sars-Covid-2 Vaccination 04/01/2019, 04/29/2019   PNEUMOCOCCAL CONJUGATE-20 11/30/2022   Pneumococcal Polysaccharide-23 01/30/1995, 12/05/2005, 03/11/2020   Pneumococcal-Unspecified 12/24/2018   Respiratory Syncytial Virus Vaccine,Recomb Aduvanted(Arexvy) 11/17/2021   Tdap 12/24/2018   Zoster Recombinant(Shingrix) 03/04/2021, 06/24/2021    Thyroid Problem Presents for follow-up visit. Symptoms include anxiety, diarrhea, fatigue, palpitations (having cardiac ablation soon) and weight loss. Patient reports no cold intolerance, constipation, depressed mood, heat intolerance, tremors or weight gain. The symptoms have been worsening.    [name] is 83 y.o. female who is being engaged in follow-up after she was seen in consultation for hypothyroidism.    PCP:  Arrie Senate, FNP.    Patient is assisted by her daughter during visit.  Her labs confirm primary hypothyroidism. Of note, she was treated with amiodarone for almost a year due to cardiac dysrhythmia .  Patient denies any family history of thyroid dysfunction, except for her sister who had nodules on her thyroid requiring biopsy which were benign.  She also had suspicious nodules on her thyroid requiring biopsy.  All came back benign, even the one that needed to be sent to [facility] for confirmation.  She reports weight gain, fatigue, cold intolerance, constipation.   Review of systems  Constitutional: + decreasing body weight- per patient and daughter report,  current Body mass index is 25.51 kg/m. , + fatigue, no subjective hyperthermia, no subjective hypothermia, + decreased appetite (nothing tastes good to her anymore) Eyes: no blurry vision, no xerophthalmia ENT: no sore throat, no nodules palpated in throat, no dysphagia/odynophagia, no hoarseness, metallic taste in mouth- says feels like blisters Cardiovascular: no chest pain, no shortness of breath, no palpitations, no leg swelling Respiratory: no cough, no shortness of breath Gastrointestinal: no nausea/vomiting, no diarrhea, + intermittent stomach pains Musculoskeletal: no muscle/joint aches Skin: no rashes, no hyperemia Neurological: no tremors, no numbness, no tingling, no dizziness Psychiatric: no depression, + anxiety (PCP currently adjusting dosage of meds)  Objective:    BP 110/60 (BP Location: Left Arm, Patient Position: Sitting, Cuff Size: Large)   Pulse 86   Ht 5\' 4"  (1.626 m)   Wt 148 lb 9.6 oz (67.4 kg)   LMP  (LMP Unknown)   BMI 25.51 kg/m   Wt Readings from Last 3 Encounters:  04/10/23 148 lb 9.6 oz (67.4 kg)  03/06/23 151 lb (68.5 kg)  03/05/23 151 lb (68.5 kg)    BP Readings from Last 3 Encounters:  04/10/23 110/60  03/06/23 138/84  12/26/22 (!) 122/58     Physical Exam- Limited  Constitutional:  Body mass index is  25.51 kg/m. , not in acute distress, normal state of mind Eyes:  EOMI, no exophthalmos Musculoskeletal: no gross deformities, strength intact in all four extremities, no gross restriction of joint movements Skin:  no rashes, no hyperemia Neurological: no tremor with outstretched hands    CMP ( most recent) CMP     Component Value Date/Time   NA 141 11/30/2022 1411   K 4.2 11/30/2022 1411   CL 100 11/30/2022 1411   CO2 24 11/30/2022 1411   GLUCOSE 151 (H) 11/30/2022 1411   GLUCOSE 162 (H) 12/14/2021 1341   BUN 10 11/30/2022 1411   CREATININE 0.69 11/30/2022 1411   CALCIUM 9.3 11/30/2022 1411   PROT 6.3 11/30/2022 1411   ALBUMIN 4.4 11/30/2022 1411   AST 17 11/30/2022 1411   ALT 11 11/30/2022 1411   ALKPHOS 100 11/30/2022 1411   BILITOT 0.3 11/30/2022 1411   GFRNONAA >60 12/14/2021 1341   GFRAA >60 10/21/2019 1347     Diabetic Labs (most recent): Lab Results  Component Value Date   HGBA1C 5.9 (H) 03/06/2023   HGBA1C 5.6 11/30/2022   HGBA1C 8.2 (H) 04/20/2022     Lipid Panel ( most recent) Lipid Panel     Component Value Date/Time   CHOL 109 01/24/2022 1026   TRIG 87 01/24/2022 1026   HDL 41 01/24/2022 1026   CHOLHDL 2.7 01/24/2022 1026   CHOLHDL 2.4 03/09/2020 0209   VLDL 12 03/09/2020 0209   LDLCALC 51 01/24/2022 1026   LABVLDL 17 01/24/2022 1026      Lab Results  Component Value Date   TSH 0.463 03/19/2023   TSH 0.646 12/15/2022  TSH 0.462 08/10/2022   TSH 0.529 07/26/2021   TSH 0.232 (L) 02/07/2021   TSH 1.995 01/10/2021   TSH 0.552 08/27/2020   TSH 0.745 08/06/2020   TSH 6.335 (H) 05/05/2020   TSH 10.867 (H) 03/03/2020   FREET4 1.09 03/19/2023   FREET4 1.35 (H) 12/15/2022   FREET4 1.18 (H) 08/10/2022   FREET4 1.06 07/26/2021   FREET4 1.37 (H) 02/07/2021   FREET4 1.26 (H) 08/06/2020   FREET4 0.95 05/05/2020   FREET4 0.65 03/03/2020   FREET4 0.44 (L) 02/03/2020     Thyroid US on 04/29/20 CLINICAL DATA:  Hyperthyroidism   EXAM: THYROID  ULTRASOUND   TECHNIQUE: Ultrasound examination of the thyroid gland and adjacent soft tissues was performed.   COMPARISON:  None.   FINDINGS: Parenchymal Echotexture: Mildly heterogeneous   Isthmus: 0.7 cm   Right lobe: 6.2 x 2.6 x 2.2 cm   Left lobe: 5.0 x 2.1 x 2.2 cm   _________________________________________________________   Estimated total number of nodules >/= 1 cm: 7   Number of spongiform nodules >/=  2 cm not described below (TR1): 0   Number of mixed cystic and solid nodules >/= 1.5 cm not described below (TR2): 0   _________________________________________________________   Nodule # 1:   Location: Right; superior   Maximum size: 1.3 cm; Other 2 dimensions: 1.3 x 1.0 cm   Composition: solid/almost completely solid (2)   Echogenicity: hypoechoic (2)   Shape: taller-than-wide (3)   Margins: smooth (0)   Echogenic foci: none (0)   ACR TI-RADS total points: 7.   ACR TI-RADS risk category: TR5 (>/= 7 points).   ACR TI-RADS recommendations:   **Given size (>/= 1.0 cm) and appearance, fine needle aspiration of this highly suspicious nodule should be considered based on TI-RADS criteria.   _________________________________________________________   Nodule # 2:   Location: Right; superior   Maximum size: 2.2 cm; Other 2 dimensions: 1.7 x 1.7 cm   Composition: solid/almost completely solid (2)   Echogenicity: isoechoic (1)   Shape: taller-than-wide (3)   Margins: smooth (0)   Echogenic foci: none (0)   ACR TI-RADS total points: 4.   ACR TI-RADS risk category: TR4 (4-6 points).   ACR TI-RADS recommendations:   **Given size (>/= 1.5 cm) and appearance, fine needle aspiration of this moderately suspicious nodule should be considered based on TI-RADS criteria.   _________________________________________________________   Nodule # 3:   Location: Right; Mid   Maximum size: 2.3 cm; Other 2 dimensions: 1.7 x 2.2 cm   Composition:  solid/almost completely solid (2)   Echogenicity: hypoechoic (2)   Shape: not taller-than-wide (0)   Margins: smooth (0)   Echogenic foci: none (0)   ACR TI-RADS total points: 4.   ACR TI-RADS risk category: TR4 (4-6 points).   ACR TI-RADS recommendations:   **Given size (>/= 1.5 cm) and appearance, fine needle aspiration of this moderately suspicious nodule should be considered based on TI-RADS criteria.   _________________________________________________________   Nodule # 4:   Location: Right; Inferior   Maximum size: 1.6 cm; Other 2 dimensions: 1.0 x 1.2 cm   Composition: mixed cystic and solid (1)   Echogenicity: isoechoic (1)   Shape: not taller-than-wide (0)   Margins: smooth (0)   Echogenic foci: none (0)   ACR TI-RADS total points: 2.   ACR TI-RADS risk category: TR2 (2 points).   ACR TI-RADS recommendations:   This nodule does NOT meet TI-RADS criteria for biopsy or dedicated follow-up.  _________________________________________________________   Nodule # 5:   Location: Left; superior   Maximum size: 1.8 cm; Other 2 dimensions: 1.3 x 1.8 cm   Composition: solid/almost completely solid (2)   Echogenicity: isoechoic (1)   Shape: not taller-than-wide (0)   Margins: smooth (0)   Echogenic foci: none (0)   ACR TI-RADS total points: 3.   ACR TI-RADS risk category: TR3 (3 points).   ACR TI-RADS recommendations:   *Given size (>/= 1.5 - 2.4 cm) and appearance, a follow-up ultrasound in 1 year should be considered based on TI-RADS criteria.   _________________________________________________________   Nodule # 6:   Location: Left; mid   Maximum size: 1.9 cm; Other 2 dimensions: 1.7 x 1.8 cm   Composition: solid/almost completely solid (2)   Echogenicity: isoechoic (1)   Shape: taller-than-wide (3)   Margins: ill-defined (0)   Echogenic foci: none (0)   ACR TI-RADS total points: 6.   ACR TI-RADS risk category: TR4 (4-6  points).   ACR TI-RADS recommendations:   **Given size (>/= 1.5 cm) and appearance, fine needle aspiration of this moderately suspicious nodule should be considered based on TI-RADS criteria.   _________________________________________________________   Nodule # 7: 1.1 cm cystic nodule in the inferior left thyroid lobe does not meet criteria for FNA or imaging follow-up.   IMPRESSION: 1. Nodule 1 (TI-RADS 5) located in the superior right thyroid lobe, measuring 1.3 x 1.3 x 1.0 cm, meets criteria for FNA. 2. Nodule 2 (TI-RADS 4), located in the superior right thyroid lobe, measuring 2.2 x 1.7 x 1.7 cm, meets criteria for FNA. 3. Nodule 3 (TI-RADS 4) located in the mid right thyroid lobe, measuring 2.3 x 1.7 x 2.2 cm, meets criteria for FNA. 4. Nodule 5 (TI-RADS 3) located in the superior left thyroid lobe, measuring 1.8 x 1.3 x 1.8 cm, meets criteria for imaging follow-up. Next ultrasound should be performed in 1 year. 5. Nodule 6 (TI-RADS 4), located in the mid left thyroid lobe, measuring 1.9 x 1.7 x 1.8 cm, meets criteria for FNA. 6. Order of suspicion of nodules for consideration for FNA: 1, 3, 2, 6   The above is in keeping with the ACR TI-RADS recommendations - J Am Coll Radiol 2017;14:587-595.     Electronically Signed   By: Acquanetta Belling M.D.   On: 04/29/2020 16:12 -------------------------------------------------------------------------------------------------------------------------- Cytology - Non PAP  CASE: [id number]  PATIENT: Phyillis Lycan  Non-Gynecological Cytology Report    FNA Thyroid Nodule Biopsy  Clinical History: 2.3 cm RML  Specimen Submitted:  A. THYROID, RML, FINE NEEDLE ASPIRATION:    FINAL MICROSCOPIC DIAGNOSIS:  - Atypia of undetermined significance (Bethesda category III)   SPECIMEN ADEQUACY:  Satisfactory for evaluation   DIAGNOSTIC COMMENTS:  This specimen will be sent for Afirma testing.   GROSS:  Received is/are 3 slides in  95% Ethyl alcohol, 3 air dried slides for  diff stain, and 30 ccs of pale pink Cytolyt solution. (CM:cm)  Prepared:  Smears:  6  Concentration Method (Thin Prep):  1  Cell Block:  Cell block attempted, not obtained.  Additional Studies:  Also there was an Afirma collected. For RUL Thyroid  FNA, see [id number].   CYTOLOGY - NON PAP  CASE: [id number]  PATIENT: Nasra Revere  Non-Gynecological Cytology Report          FNA Thyroid Nodule Biopsy  Clinical History: 1.3 cm RUL  Specimen Submitted:  A. THYROID, RUL, FINE NEEDLE ASPIRATION:    FINAL MICROSCOPIC DIAGNOSIS:  -  Consistent with benign follicular nodule (Bethesda category II)   SPECIMEN ADEQUACY:  Satisfactory for evaluation   GROSS:  Received is/are 4 slides in 95% Ethyl alcohol, 4 air dried slides for  diff stain, and 30 ccs of pale pink Cytolyt solution. (CM:cm)  Prepared:  Smears:  8  Concentration Method (Thin Prep):  1  Cell Block:  Cell block attempted, not obtained.  Additional Studies:  Also there was an Afirma collected. For RML Thyroid  FNA, see [id number].    Repeat thyroid US from 07/26/21 CLINICAL DATA:  Goiter. Multifocal thyroid ultrasound. Patient previously underwent biopsy of right superior and right mid thyroid nodules in March of 2022   EXAM: THYROID ULTRASOUND   TECHNIQUE: Ultrasound examination of the thyroid gland and adjacent soft tissues was performed.   COMPARISON:  Prior thyroid ultrasound 04/29/2020   FINDINGS: Parenchymal Echotexture: Markedly heterogenous   Isthmus: 0.5 cm   Right lobe: 4.5 x 2.1 x 2.4 cm   Left lobe: 4.5 x 2.0 x 2.1 cm   _________________________________________________________   Estimated total number of nodules >/= 1 cm: 5   Number of spongiform nodules >/=  2 cm not described below (TR1): 0   Number of mixed cystic and solid nodules >/= 1.5 cm not described below (TR2): 0   _________________________________________________________   The  thyroid gland is diffusely heterogeneous, enlarged and nodular. Many of the areas of "nodularity "are inseparable from the background thyroid parenchyma and most likely to represent areas of pseudo nodularity.   Nodule # 1: The previously biopsied nodule/cluster of nodules in the right superior gland demonstrates no significant interval change compared to prior imaging obtained at the time of biopsy.   Nodule # 3: The previously biopsied nodule with central dystrophic calcifications in the right mid to lower gland measures 1.9 x 1.7 x 1.2 cm; slightly smaller than 2.3 by 1.7 x 2.2 cm previously.   Additional measured areas either represent benign-appearing spongiform nodules, or vague regions of pseudo nodularity. No other discrete nodules identified that would warrant further evaluation.   IMPRESSION: Overall, unchanged appearance of the thyroid gland which is diffusely enlarged, heterogeneous and lobular with multiple regions of pseudonodularity.   The previously biopsied nodules in the right upper and right mid gland demonstrate no significant interval change.   No additional foci of definitive nodularity that would warrant biopsy or dedicated imaging surveillance.     Electronically Signed   By: Malachy Moan M.D.   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  She is advised to continue dose of Levothyroxine 25 mcg po daily before breakfast.    - We discussed about the correct intake of her thyroid hormone, on empty stomach  at fasting, with water, separated by at least 30 minutes from breakfast and other medications,  and separated by more than 4 hours from calcium, iron, multivitamins, acid reflux medications (PPIs). -Patient is made aware of the fact that thyroid hormone replacement is needed for life, dose to be adjusted by periodic monitoring of thyroid function tests.   2. Multinodular  Goiter -Her thyroid ultrasound shows multinodular goiter with 4 moderately suspicious nodules that meet criteria for FNA and 1 nodule recommending follow up with ultrasound in 1 year.   All biopsies were confirmed to be benign.   Her follow up ultrasound shows stable MNG, no further surveillance is needed at this time.    - she is advised to maintain close follow up with Milian, Aleen Campi, FNP for primary care needs.      I spent  21  minutes in the care of the patient today including review of labs from Thyroid Function, CMP, and other relevant labs ; imaging/biopsy records (current and previous including abstractions from other facilities); face-to-face time discussing  her lab results and symptoms, medications doses, her options of short and long term treatment based on the latest standards of care / guidelines;   and documenting the encounter.  [name]  participated in the discussions, expressed understanding, and voiced agreement with the above plans.  All questions were answered to her satisfaction. she is encouraged to contact clinic should she have any questions or concerns prior to her return visit.   Follow up plan: Return in about 4 months (around 08/08/2023) for Thyroid follow up, Previsit labs.  Ronny Bacon, [facility] [address] [place], [place] [address] Phone: [phone] Fax: [phone]   04/10/2023, 3:26 PM

## 2023-04-11 ENCOUNTER — Other Ambulatory Visit: Payer: Self-pay | Admitting: *Deleted

## 2023-04-11 NOTE — Patient Outreach (Signed)
Care Management   Visit Note  04/11/2023 Name: [name] MRN: [id number] DOB: 28-Sep-1940  Subjective: [name] is a 83 y.o. year old female who is a primary care patient of Ellamae Sia, Aleen Campi, FNP. The Care Management team was consulted for assistance.      Engaged with patient spoke with patient by telephone.    Goals Addressed             This Visit's Progress    RNCM Care Management Expected Outcomes: Monitor, Self-Manage and Reduce Symptoms of: HTN, Afib, DM       Current Barriers:  Knowledge Deficits related to plan of care for management of Atrial Fibrillation, HTN, and DMII  Chronic Disease Management support and education needs related to Atrial Fibrillation, HTN, and DMII   RNCM Clinical Goal(s):  Patient will verbalize basic understanding of  Atrial Fibrillation, HTN, and DMII disease process and self health management plan as evidenced by verbal explanation, recognizing symptoms, lifestyle modifications attend all scheduled medical appointments: with primary care provider and specialists as evidenced by keeping all scheduled appointments demonstrate Improved and Ongoing adherence to prescribed treatment plan for Atrial Fibrillation, HTN, and DMII as evidenced by consistent medication compliance, symptom monitoring, continued lifestyle modifications continue to work with RN Care Manager to address care management and care coordination needs related to  Atrial Fibrillation, HTN, and DMII as evidenced by adherence to CM Team Scheduled appointments through collaboration with RN Care manager, provider, and care team.   Interventions: Evaluation of current treatment plan related to  self management and patient's adherence to plan as established by provider   AFIB Interventions: (Status:  Condition stable.  Not addressed this visit.) Long Term Goal Counseled on increased risk of stroke due to Afib and benefits of anticoagulation for stroke prevention Reviewed  importance of adherence to anticoagulant exactly as prescribed. Reports compliance  Advised patient to discuss any  new changes or falls with injury with provider Counseled on bleeding risk associated with Eliquis and importance of self-monitoring for signs/symptoms of bleeding Counseled on avoidance of NSAIDs due to increased bleeding risk with anticoagulants Counseled on importance of regular laboratory monitoring as prescribed Counseled on seeking medical attention after a head injury or if there is blood in the urine/stool Afib action plan reviewed Screening for signs and symptoms of depression related to chronic disease state  Assessed social determinant of health barriers   Diabetes Interventions:  (Status:  Goal on track:  Yes.) Long Term Goal Assessed patient's understanding of A1c goal: <6.5% Provided education to patient about basic DM disease process Reviewed medications with patient and discussed importance of medication adherence Counseled on importance of regular laboratory monitoring as prescribed Discussed plans with patient for ongoing care management follow up and provided patient with direct contact information for care management team Provided patient with written educational materials related to hypo and hyperglycemia and importance of correct treatment Reviewed scheduled/upcoming provider appointments including: 08-09-2023 with Rheumatology, 06-05-2023 with PCP Advised patient, providing education and rationale, to check cbg as recommended and record, calling provider for findings outside established parameters Review of patient status, including review of consultants reports, relevant laboratory and other test results, and medications completed Screening for signs and symptoms of depression related to chronic disease state  Assessed social determinant of health barriers Lab Results  Component Value Date   HGBA1C 5.9 (H) 03/06/2023    Hypertension Interventions:   (Status:  Goal on track:  Yes.) Long Term Goal Last practice recorded  BP readings:  BP Readings from Last 3 Encounters:  04/10/23 110/60  03/06/23 138/84  12/26/22 (!) 122/58   Most recent eGFR/CrCl:  Lab Results  Component Value Date   EGFR 87 11/30/2022    No components found for: "CRCL"  Evaluation of current treatment plan related to hypertension self management and patient's adherence to plan as established by provider. Reports regularly checking her BP and states they are normal at home.  RNCM reviewed with patient goals for SBP<140 DBP<90.  Provided education to patient re: stroke prevention, s/s of heart attack and stroke. Education and support provided. Reviewed medications with patient and discussed importance of compliance. Reports compliance with all medications. Counseled on adverse effects of illicit drug and excessive alcohol use in patients with high blood pressure  Counseled on the importance of exercise goals with target of 150 minutes per week Discussed plans with patient for ongoing care management follow up and provided patient with direct contact information for care management team Advised patient, providing education and rationale, to monitor blood pressure daily and record, calling PCP for findings outside established parameters Reviewed scheduled/upcoming provider appointments including: 08-09-2023 with Rheumatology, 06-05-2023 with PCP Advised patient to discuss acute changes with provider Provided education on prescribed diet Heart Healthy/ADA Discussed complications of poorly controlled blood pressure such as heart disease, stroke, circulatory complications, vision complications, kidney impairment, sexual dysfunction Screening for signs and symptoms of depression related to chronic disease state  Assessed social determinant of health barriers  Patient Goals/Self-Care Activities: Take all medications as prescribed Attend all scheduled provider appointments Call  pharmacy for medication refills 3-7 days in advance of running out of medications Attend church or other social activities Perform all self care activities independently  Perform IADL's (shopping, preparing meals, housekeeping, managing finances) independently Call provider office for new concerns or questions  schedule appointment with eye doctor check feet daily for cuts, sores or redness take medicine as prescribed check blood pressure daily write blood pressure results in a log or diary take blood pressure log to all doctor appointments  Follow Up Plan:  Telephone follow up appointment with care management team member scheduled for:  05-11-2023 at 9:00 am              Consent to Services:  Patient was given information about care management services, agreed to services, and gave verbal consent to participate.   Plan: Telephone follow up appointment with care management team member scheduled for:05-11-2023 at 9:00 am  Larey Brick, BSN RN [facility], [facility] RN Care Manager Direct Dial: [phone]  Fax: [phone]

## 2023-04-11 NOTE — Patient Instructions (Signed)
Visit Information  Thank you for taking time to visit with me today. Please don't hesitate to contact me if I can be of assistance to you before our next scheduled telephone appointment.  Following are the goals we discussed today:   Goals Addressed             This Visit's Progress    RNCM Care Management Expected Outcomes: Monitor, Self-Manage and Reduce Symptoms of: HTN, Afib, DM       Current Barriers:  Knowledge Deficits related to plan of care for management of Atrial Fibrillation, HTN, and DMII  Chronic Disease Management support and education needs related to Atrial Fibrillation, HTN, and DMII   RNCM Clinical Goal(s):  Patient will verbalize basic understanding of  Atrial Fibrillation, HTN, and DMII disease process and self health management plan as evidenced by verbal explanation, recognizing symptoms, lifestyle modifications attend all scheduled medical appointments: with primary care provider and specialists as evidenced by keeping all scheduled appointments demonstrate Improved and Ongoing adherence to prescribed treatment plan for Atrial Fibrillation, HTN, and DMII as evidenced by consistent medication compliance, symptom monitoring, continued lifestyle modifications continue to work with RN Care Manager to address care management and care coordination needs related to  Atrial Fibrillation, HTN, and DMII as evidenced by adherence to CM Team Scheduled appointments through collaboration with RN Care manager, provider, and care team.   Interventions: Evaluation of current treatment plan related to  self management and patient's adherence to plan as established by provider   AFIB Interventions: (Status:  Condition stable.  Not addressed this visit.) Long Term Goal Counseled on increased risk of stroke due to Afib and benefits of anticoagulation for stroke prevention Reviewed importance of adherence to anticoagulant exactly as prescribed. Reports compliance  Advised patient to  discuss any  new changes or falls with injury with provider Counseled on bleeding risk associated with Eliquis and importance of self-monitoring for signs/symptoms of bleeding Counseled on avoidance of NSAIDs due to increased bleeding risk with anticoagulants Counseled on importance of regular laboratory monitoring as prescribed Counseled on seeking medical attention after a head injury or if there is blood in the urine/stool Afib action plan reviewed Screening for signs and symptoms of depression related to chronic disease state  Assessed social determinant of health barriers   Diabetes Interventions:  (Status:  Goal on track:  Yes.) Long Term Goal Assessed patient's understanding of A1c goal: <6.5% Provided education to patient about basic DM disease process Reviewed medications with patient and discussed importance of medication adherence Counseled on importance of regular laboratory monitoring as prescribed Discussed plans with patient for ongoing care management follow up and provided patient with direct contact information for care management team Provided patient with written educational materials related to hypo and hyperglycemia and importance of correct treatment Reviewed scheduled/upcoming provider appointments including: 08-09-2023 with Rheumatology, 06-05-2023 with PCP Advised patient, providing education and rationale, to check cbg as recommended and record, calling provider for findings outside established parameters Review of patient status, including review of consultants reports, relevant laboratory and other test results, and medications completed Screening for signs and symptoms of depression related to chronic disease state  Assessed social determinant of health barriers Lab Results  Component Value Date   HGBA1C 5.9 (H) 03/06/2023    Hypertension Interventions:  (Status:  Goal on track:  Yes.) Long Term Goal Last practice recorded BP readings:  BP Readings from Last 3  Encounters:  04/10/23 110/60  03/06/23 138/84  12/26/22 (!) 122/58  Most recent eGFR/CrCl:  Lab Results  Component Value Date   EGFR 87 11/30/2022    No components found for: "CRCL"  Evaluation of current treatment plan related to hypertension self management and patient's adherence to plan as established by provider. Reports regularly checking her BP and states they are normal at home.  RNCM reviewed with patient goals for SBP<140 DBP<90.  Provided education to patient re: stroke prevention, s/s of heart attack and stroke. Education and support provided. Reviewed medications with patient and discussed importance of compliance. Reports compliance with all medications. Counseled on adverse effects of illicit drug and excessive alcohol use in patients with high blood pressure  Counseled on the importance of exercise goals with target of 150 minutes per week Discussed plans with patient for ongoing care management follow up and provided patient with direct contact information for care management team Advised patient, providing education and rationale, to monitor blood pressure daily and record, calling PCP for findings outside established parameters Reviewed scheduled/upcoming provider appointments including: 08-09-2023 with Rheumatology, 06-05-2023 with PCP Advised patient to discuss acute changes with provider Provided education on prescribed diet Heart Healthy/ADA Discussed complications of poorly controlled blood pressure such as heart disease, stroke, circulatory complications, vision complications, kidney impairment, sexual dysfunction Screening for signs and symptoms of depression related to chronic disease state  Assessed social determinant of health barriers  Patient Goals/Self-Care Activities: Take all medications as prescribed Attend all scheduled provider appointments Call pharmacy for medication refills 3-7 days in advance of running out of medications Attend church or other  social activities Perform all self care activities independently  Perform IADL's (shopping, preparing meals, housekeeping, managing finances) independently Call provider office for new concerns or questions  schedule appointment with eye doctor check feet daily for cuts, sores or redness take medicine as prescribed check blood pressure daily write blood pressure results in a log or diary take blood pressure log to all doctor appointments  Follow Up Plan:  Telephone follow up appointment with care management team member scheduled for:  05-11-2023 at 9:00 am           Our next appointment is by telephone on 05-11-2023 at 9:00 am  Please call the care guide team at [phone] if you need to cancel or reschedule your appointment.   If you are experiencing a Mental Health or Behavioral Health Crisis or need someone to talk to, please call the Suicide and Crisis Lifeline: 988 call the [place] National Suicide Prevention Lifeline: 647-737-3657 or TTY: 337-508-4283 TTY 346-869-9950) to talk to a trained counselor call 1-800-273-TALK (toll free, 24 hour hotline)   Patient verbalizes understanding of instructions and care plan provided today and agrees to view in MyChart. Active MyChart status and patient understanding of how to access instructions and care plan via MyChart confirmed with patient.     Telephone follow up appointment with care management team member scheduled for:05-11-2023 at 9:00 am  Larey Brick, BSN RN [facility], [facility] RN Care Manager Direct Dial: [phone]  Fax: [phone]

## 2023-04-13 ENCOUNTER — Other Ambulatory Visit: Payer: Self-pay | Admitting: Family Medicine

## 2023-04-13 DIAGNOSIS — F5101 Primary insomnia: Secondary | ICD-10-CM

## 2023-04-16 ENCOUNTER — Ambulatory Visit (INDEPENDENT_AMBULATORY_CARE_PROVIDER_SITE_OTHER): Payer: Medicare Other

## 2023-04-16 DIAGNOSIS — I4891 Unspecified atrial fibrillation: Secondary | ICD-10-CM

## 2023-04-18 LAB — CUP PACEART REMOTE DEVICE CHECK
Date Time Interrogation Session: 20250223230329
Implantable Pulse Generator Implant Date: 20220210

## 2023-04-19 NOTE — Progress Notes (Signed)
 Carelink Summary Report / Loop Recorder

## 2023-04-24 ENCOUNTER — Encounter: Payer: Self-pay | Admitting: Cardiovascular Disease

## 2023-04-27 ENCOUNTER — Telehealth: Payer: Self-pay | Admitting: Cardiovascular Disease

## 2023-04-27 NOTE — Telephone Encounter (Signed)
 Patient c/o Palpitations:  STAT if patient reporting lightheadedness, shortness of breath, or chest pain  How long have you had palpitations/irregular HR/ Afib? Afib x 1wk Are you having the symptoms now? No Are you currently experiencing lightheadedness, SOB or CP? No  Do you have a history of afib (atrial fibrillation) or irregular heart rhythm? yes  Have you checked your BP or HR? (document readings if available): No  Are you experiencing any other symptoms? Occasional SOB when having afib

## 2023-04-30 ENCOUNTER — Ambulatory Visit: Payer: Self-pay | Admitting: Family Medicine

## 2023-04-30 NOTE — Telephone Encounter (Signed)
   Additional Notes: Patient's daughter Abigail Miyamoto) called in and states someone was going to call her back about making an appointment for her mother.  Patient's daughter Abigail Miyamoto did not know that her mother has spoken to a nurse earlier and was triaged and an appointment was made.  Patient's daughter walked inside and asked her mother if someone called and her mother (the patient) confirmed that she spoke with a nurse earlier and had an appointment made for tomorrow.  Daughter was fine with this. Her mother just hadn't updated her that she spoke with anyone from the office. She asked if her mother could be added to the Wait List for if someone cancelled and this RN added her mother to the Wait List for any cancellations.  Patient's daughter appreciated the response. Reason for Disposition . Caller has already spoken with another triager and has no further questions.  Protocols used: No Contact or Duplicate Contact Call-A-AH

## 2023-04-30 NOTE — Telephone Encounter (Signed)
 Spoke with patient. Stated that she called in on Friday when she was having symptoms. Had been going on for a week or so. Mentioned she had some chest pain and took a Nitroglycerin on Monday and pain went away. Palpitations come and go. Currently was not experiencing any she has a bad cold along with a cough. She believes that's may be where he SOB is coming from now. She wasn't able to check BP or HR right now machine not working. Reported that she is trying to get in with her PCP tomorrow. Will see what her BP and HR is then. Advised her of ER precautions as well if symptoms come back and get worse. Patient verbalized understanding.

## 2023-04-30 NOTE — Telephone Encounter (Signed)
 Copied from CRM [id number]. Topic: Clinical - Pink Word Triage >> Apr 30, 2023 10:53 AM Runell Gess P wrote: Reason for CRM: pt has a cough and can not get into the office today because there is no appts.  Daughter is worried because cough is bad.  [phone]   Chief Complaint: Cough Symptoms: Cough, runny nose, intermittent chest pain  Frequency: Frequent cough  Pertinent Negatives: Patient denies fever, shortness of breath  Disposition: [] ED /[] Urgent Care (no appt availability in office) / [x] Appointment(In office/virtual)/ []  [facility]/ [] Home Care/ [] Refused Recommended Disposition /[] [facility]/ []  Follow-up with PCP Additional Notes: Patient reports she has had a cough for the last 3 days. She states that with her cough she is also experiencing a runny nose and some mild chest pain that is only present when coughing. Patient denies any fevers. Appointment made for tomorrow for the patient to be evaluated.     Reason for Disposition  Cough with cold symptoms (e.g., runny nose, postnasal drip, throat clearing)    Appointment made per patient request  Answer Assessment - Initial Assessment Questions 1. ONSET: "When did the cough begin?"      3 days  2. SEVERITY: "How bad is the cough today?"      Mild to moderate  3. SPUTUM: "Describe the color of your sputum" (none, dry cough; clear, white, yellow, green)     Yellow 4. HEMOPTYSIS: "Are you coughing up any blood?" If so ask: "How much?" (flecks, streaks, tablespoons, etc.)     No 5. DIFFICULTY BREATHING: "Are you having difficulty breathing?" If Yes, ask: "How bad is it?" (e.g., mild, moderate, severe)    - MILD: No SOB at rest, mild SOB with walking, speaks normally in sentences, can lie down, no retractions, pulse < 100.    - MODERATE: SOB at rest, SOB with minimal exertion and prefers to sit, cannot lie down flat, speaks in phrases, mild retractions, audible wheezing, pulse 100-120.    - SEVERE: Very SOB at  rest, speaks in single words, struggling to breathe, sitting hunched forward, retractions, pulse > 120      Mild last night  6. FEVER: "Do you have a fever?" If Yes, ask: "What is your temperature, how was it measured, and when did it start?"     No 7. CARDIAC HISTORY: "Do you have any history of heart disease?" (e.g., heart attack, congestive heart failure)      Yes 8. LUNG HISTORY: "Do you have any history of lung disease?"  (e.g., pulmonary embolus, asthma, emphysema)     No 9. PE RISK FACTORS: "Do you have a history of blood clots?" (or: recent major surgery, recent prolonged travel, bedridden)     No 10. OTHER SYMPTOMS: "Do you have any other symptoms?" (e.g., runny nose, wheezing, chest pain)       Runny nose  Protocols used: Cough - Acute Productive-A-AH

## 2023-05-01 ENCOUNTER — Encounter: Payer: Self-pay | Admitting: Family Medicine

## 2023-05-01 ENCOUNTER — Ambulatory Visit: Admitting: Family Medicine

## 2023-05-01 VITALS — BP 143/56 | HR 83 | Temp 98.5°F | Ht 64.0 in | Wt 147.0 lb

## 2023-05-01 DIAGNOSIS — R051 Acute cough: Secondary | ICD-10-CM

## 2023-05-01 DIAGNOSIS — R0781 Pleurodynia: Secondary | ICD-10-CM | POA: Diagnosis not present

## 2023-05-01 DIAGNOSIS — I152 Hypertension secondary to endocrine disorders: Secondary | ICD-10-CM

## 2023-05-01 DIAGNOSIS — E1159 Type 2 diabetes mellitus with other circulatory complications: Secondary | ICD-10-CM | POA: Diagnosis not present

## 2023-05-01 MED ORDER — LIDOCAINE 4 % EX PTCH
2.0000 | MEDICATED_PATCH | CUTANEOUS | 0 refills | Status: DC
Start: 2023-05-01 — End: 2023-07-13

## 2023-05-01 MED ORDER — BENZONATATE 100 MG PO CAPS: 100.0000 mg | ORAL_CAPSULE | Freq: Three times a day (TID) | ORAL | 0 refills | Status: DC | PRN

## 2023-05-01 NOTE — Progress Notes (Signed)
 Subjective:  Patient ID: [name], female    DOB: [birth year], 83 y.o.   MRN: [id number]  Patient Care Team: Arrie Senate, FNP as PCP - General (Family Medicine) Jonelle Sidle, MD as PCP - Cardiology (Cardiology) Mealor, Roberts Gaudy, MD as PCP - Electrophysiology (Cardiology) Newman Pies, MD as Consulting Physician (Otolaryngology) Danella Maiers, [facility] (Pharmacist) Dani Gobble, NP as Nurse Practitioner (Endocrinology) [facility] Ricky Stabs, RN as VBCI Care Management (General Practice)   Chief Complaint:  cough and runny nose  (X 3 days/Cough/Congestion/Rhinorrhea//)  HPI: [name] is a 83 y.o. female presenting on 05/01/2023 for cough and runny nose  (X 3 days/Cough/Congestion/Rhinorrhea//)  States that symptoms started Friday. States that she is better, but sore. Reports coughing, mainly at night, headache, rhinorrhea, sneezing. She was taking coricidin HBP. States that she is not coughing as much phlegm as she did yesterday. She is taking Norco as prescribed for her pain. She is mainly sore in her ribs. On occasion takes additional tylenol arthritis.    Relevant past medical, surgical, family, and social history reviewed and updated as indicated.  Allergies and medications reviewed and updated. Data reviewed: Chart in Epic.   Past Medical History:  Diagnosis Date   Anxiety    Arthritis    Bulging lumbar disc    Chronic lower back pain    Coronary atherosclerosis of native coronary artery    DES LAD 02/2008, DES LAD 02/2015, DES ramus and DES x2 mid RCA 02/2020   Depression    Dyslipidemia    Essential hypertension    Facial numbness    Frequent headaches    GERD (gastroesophageal reflux disease)    Glaucoma    Heart attack (HCC) 02/2020   Insomnia    Paroxysmal atrial fibrillation (HCC)    Diagnosed October 2019   PSVT (paroxysmal supraventricular tachycardia) (HCC)    Refusal of blood transfusions as patient is  [employer]    Thyroid disease    Type 2 diabetes mellitus (HCC)    Borderline   Vitamin D deficiency     Past Surgical History:  Procedure Laterality Date   ATRIAL FIBRILLATION ABLATION N/A 02/16/2021   Procedure: ATRIAL FIBRILLATION ABLATION;  Surgeon: Hillis Range, MD;  Location: [facility] INVASIVE CV LAB;  Service: Cardiovascular;  Laterality: N/A;   BIOPSY  09/21/2021   Procedure: BIOPSY;  Surgeon: Corbin Ade, MD;  Location: [facility] ENDO SUITE;  Service: Endoscopy;;  gastric   CARDIAC CATHETERIZATION N/A 02/23/2015   Procedure: Left Heart Cath and Coronary Angiography;  Surgeon: Tonny Bollman, MD;  Location: [facility] INVASIVE CV LAB;  Service: Cardiovascular;  Laterality: N/A;   CARDIAC CATHETERIZATION N/A 02/23/2015   Procedure: Coronary Stent Intervention;  Surgeon: Tonny Bollman, MD;  Location: [facility] INVASIVE CV LAB;  Service: Cardiovascular;  Laterality: N/A;   CARDIAC CATHETERIZATION N/A 01/04/2016   Procedure: Left Heart Cath and Coronary Angiography;  Surgeon: Peter M Swaziland, MD;  Location: [facility] INVASIVE CV LAB;  Service: Cardiovascular;  Laterality: N/A;   CATARACT EXTRACTION     CORONARY ANGIOPLASTY  02/23/2015   CORONARY ANGIOPLASTY WITH STENT PLACEMENT  2009   CORONARY STENT INTERVENTION N/A 03/09/2020   Procedure: CORONARY STENT INTERVENTION;  Surgeon: Corky Crafts, MD;  Location: [facility] INVASIVE CV LAB;  Service: Cardiovascular;  Laterality: N/A;   DILATION AND CURETTAGE OF UTERUS     ESOPHAGOGASTRODUODENOSCOPY (EGD) WITH PROPOFOL N/A 09/21/2021   normal esophagus, abnormal gastric mucosa  s/p biopsy, normal duodenum. Pathology with H.pylori. Prescribed Prevpac.   implantable loop recorder placement  04/01/2020   [employer] Reveal [place] model LNQ 22 [id number]) implantable loop recorder    LEFT HEART CATH AND CORONARY ANGIOGRAPHY N/A 03/09/2020   Procedure: LEFT HEART CATH AND CORONARY ANGIOGRAPHY;  Surgeon: Corky Crafts, MD;  Location: [facility] INVASIVE CV LAB;  Service:  Cardiovascular;  Laterality: N/A;   TUBAL LIGATION      Social History   Socioeconomic History   Marital status: Widowed    Spouse name: Not on file   Number of children: 8   Years of education: 10th grade   Highest education level: 10th grade  Occupational History   Occupation: RETIRED  Tobacco Use   Smoking status: Never   Smokeless tobacco: Never   Tobacco comments:    Never smoke 11/15/21  Vaping Use   Vaping status: Never Used  Substance and Sexual Activity   Alcohol use: Not Currently    Alcohol/week: 1.0 standard drink of alcohol    Types: 1 Glasses of wine per week    Comment: RARE OCCASION   Drug use: No   Sexual activity: Not Currently  Other Topics Concern   Not on file  Social History Narrative   Lives at home alone.   Right-handed.   No caffeine use.   Daughter lives next door and helps her out   Social Drivers of Health   Financial Resource Strain: Low Risk  (03/21/2023)   Received from [facility]   Overall Financial Resource Strain (CARDIA)    Difficulty of Paying Living Expenses: Not very hard  Food Insecurity: No Food Insecurity (03/21/2023)   Received from [facility]   Hunger Vital Sign    Worried About Running Out of Food in the Last Year: Never true    Ran Out of Food in the Last Year: Never true  Transportation Needs: No Transportation Needs (03/21/2023)   Received from [facility] - Transportation    Lack of Transportation (Medical): No    Lack of Transportation (Non-Medical): No  Physical Activity: Inactive (03/05/2023)   Exercise Vital Sign    Days of Exercise per Week: 0 days    Minutes of Exercise per Session: 0 min  Stress: No Stress Concern Present (03/05/2023)   [employer] of Occupational Health - Occupational Stress Questionnaire    Feeling of Stress : Not at all  Social Connections: Moderately Isolated (03/05/2023)   Social Connection and Isolation Panel [NHANES]    Frequency of Communication with Friends  and Family: More than three times a week    Frequency of Social Gatherings with Friends and Family: Three times a week    Attends Religious Services: More than 4 times per year    Active Member of Clubs or Organizations: No    Attends Banker Meetings: Never    Marital Status: Widowed  Intimate Partner Violence: Not At Risk (03/05/2023)   Humiliation, Afraid, Rape, and Kick questionnaire    Fear of Current or Ex-Partner: No    Emotionally Abused: No    Physically Abused: No    Sexually Abused: No    Outpatient Encounter Medications as of 05/01/2023  Medication Sig   acetaminophen (TYLENOL) 500 MG tablet Take 1,000 mg by mouth every 6 (six) hours as needed for moderate pain.   Ascorbic Acid (VITAMIN C) 500 MG tablet Take 500 mg by mouth daily.     bimatoprost (LUMIGAN) 0.01 % SOLN Place  1 drop into both eyes at bedtime.   Calcium Carbonate Antacid (TUMS PO) Take 2 tablets by mouth 4 (four) times daily as needed (for acid reflux/indigestion).    Camphor-Menthol-Methyl Sal (SALONPAS) 3.02-26-08 % PTCH Apply 1 patch topically daily as needed (pain).   cetirizine (ZYRTEC) 10 MG tablet Take 10 mg by mouth daily as needed for allergies.   Cyanocobalamin (VITAMIN B 12 PO) Take 1 tablet by mouth daily.   diclofenac Sodium (VOLTAREN) 1 % GEL Apply 2 g topically 4 (four) times daily. (Patient taking differently: Apply 2 g topically 4 (four) times daily as needed (pain).)   diltiazem (CARDIZEM CD) 300 MG 24 hr capsule TAKE ONE CAPSULE BY MOUTH DAILY   DULoxetine (CYMBALTA) 20 MG capsule TAKE ONE CAPSULE BY MOUTH DAILY   DULoxetine (CYMBALTA) 30 MG capsule Take 30 mg by mouth daily.   ELIQUIS 5 MG TABS tablet TAKE ONE TABLET BY MOUTH TWICE DAILY   GARLIC PO Take 1 tablet by mouth daily.   HYDROcodone-acetaminophen (NORCO) 7.5-325 MG tablet Take 1 tablet by mouth daily as needed for moderate pain (pain score 4-6).   [START ON 05/22/2023] HYDROcodone-acetaminophen (NORCO) 7.5-325 MG tablet Take  1 tablet by mouth daily as needed for moderate pain (pain score 4-6).   levothyroxine (SYNTHROID) 25 MCG tablet Take 1 tablet (25 mcg total) by mouth daily before breakfast.   losartan (COZAAR) 50 MG tablet TAKE ONE (1) TABLET BY MOUTH EVERY DAY   MAGNESIUM CARBONATE PO Take 1 tablet by mouth at bedtime.   metFORMIN (GLUCOPHAGE) 500 MG tablet TAKE 1 TABLET BY MOUTH TWICE DAILY WITH MEALS   metoprolol succinate (TOPROL-XL) 100 MG 24 hr tablet TAKE 1 TABLET BY MOUTH DAILY WITH FOOD   nitroGLYCERIN (NITROSTAT) 0.4 MG SL tablet DISSOLVE 1 TAB UNDER TOUNGE FOR CHEST PAIN. MAY REPEAT EVERY 5 MINUTES FOR 3 DOSES. IF NO RELIEF CALL 911 OR GO TO ER   rosuvastatin (CRESTOR) 20 MG tablet TAKE ONE (1) TABLET BY MOUTH EVERY DAY   timolol (TIMOPTIC) 0.5 % ophthalmic solution Place 1 drop into both eyes daily.   traZODone (DESYREL) 150 MG tablet TAKE ONE TABLET BY MOUTH AT BEDTIME   TURMERIC PO Take 1 tablet by mouth daily.   VENTOLIN HFA 108 (90 Base) MCG/ACT inhaler Inhale 1-2 puffs into the lungs every 4 (four) hours as needed for shortness of breath.   Vitamin D, Cholecalciferol, 10 MCG (400 UNIT) CAPS Take 400 Units by mouth daily.   No facility-administered encounter medications on file as of 05/01/2023.    No Known Allergies  Review of Systems As per HPI  Objective:  BP (!) 143/56   Pulse 83   Temp 98.5 F (36.9 C)   Ht 5\' 4"  (1.626 m)   Wt 147 lb (66.7 kg)   LMP  (LMP Unknown)   SpO2 97%   BMI 25.23 kg/m    Wt Readings from Last 3 Encounters:  05/01/23 147 lb (66.7 kg)  04/10/23 148 lb 9.6 oz (67.4 kg)  03/06/23 151 lb (68.5 kg)   Physical Exam Constitutional:      General: She is awake. She is not in acute distress.    Appearance: Normal appearance. She is well-developed and well-groomed. She is not ill-appearing, toxic-appearing or diaphoretic.  HENT:     Right Ear: No drainage, swelling or tenderness. A middle ear effusion is present. There is no impacted cerumen. No foreign  body. No mastoid tenderness. No PE tube. No hemotympanum. Tympanic membrane is  not injected, scarred, perforated, erythematous, retracted or bulging.     Left Ear: No drainage, swelling or tenderness. A middle ear effusion is present. There is no impacted cerumen. No foreign body. No mastoid tenderness. No PE tube. No hemotympanum. Tympanic membrane is not injected, scarred, perforated, erythematous, retracted or bulging.     Nose: Congestion and rhinorrhea present. Rhinorrhea is clear.     Right Turbinates: Not enlarged, swollen or pale.     Left Turbinates: Not enlarged, swollen or pale.     Right Sinus: No maxillary sinus tenderness or frontal sinus tenderness.     Left Sinus: No maxillary sinus tenderness or frontal sinus tenderness.     Mouth/Throat:     Lips: Pink.     Mouth: Mucous membranes are moist.     Tongue: No lesions.     Palate: No mass.     Pharynx: Posterior oropharyngeal erythema present. No pharyngeal swelling, oropharyngeal exudate or postnasal drip.     Tonsils: No tonsillar exudate or tonsillar abscesses.  Cardiovascular:     Rate and Rhythm: Normal rate. Rhythm irregular.     Pulses: Normal pulses.          Radial pulses are 2+ on the right side and 2+ on the left side.       Posterior tibial pulses are 2+ on the right side and 2+ on the left side.     Heart sounds: Normal heart sounds. No murmur heard.    No gallop.  Pulmonary:     Effort: Pulmonary effort is normal. No respiratory distress.     Breath sounds: Normal breath sounds. No stridor. No wheezing, rhonchi or rales.  Musculoskeletal:     Cervical back: Full passive range of motion without pain and neck supple.     Right lower leg: No edema.     Left lower leg: No edema.  Lymphadenopathy:     Head:     Right side of head: No submental, submandibular, tonsillar, preauricular or posterior auricular adenopathy.     Left side of head: No submental, submandibular, tonsillar, preauricular or posterior  auricular adenopathy.     Cervical:     Right cervical: No superficial cervical adenopathy.    Left cervical: No superficial cervical adenopathy.  Skin:    General: Skin is warm.     Capillary Refill: Capillary refill takes less than 2 seconds.  Neurological:     General: No focal deficit present.     Mental Status: She is alert, oriented to person, place, and time and easily aroused. Mental status is at baseline.     GCS: GCS eye subscore is 4. GCS verbal subscore is 5. GCS motor subscore is 6.     Motor: No weakness.  Psychiatric:        Attention and Perception: Attention and perception normal.        Mood and Affect: Mood and affect normal.        Speech: Speech normal.        Behavior: Behavior normal. Behavior is cooperative.        Thought Content: Thought content normal. Thought content does not include homicidal or suicidal ideation. Thought content does not include homicidal or suicidal plan.        Cognition and Memory: Cognition and memory normal.        Judgment: Judgment normal.    Results for orders placed or performed in visit on 04/16/23  CUP PACEART REMOTE DEVICE CHECK  Collection Time: 04/15/23 11:03 PM  Result Value Ref Range   Date Time Interrogation Session [phone]    Pulse Generator Manufacturer MERM    Pulse Gen Model KGM01 Donato Heinz    Pulse Gen Serial Number [id number]    Clinic Name [facility]    Implantable Pulse Generator Type ICM/ILR    Implantable Pulse Generator Implant Date [id number]        05/01/2023    4:24 PM 03/06/2023   10:30 AM 03/05/2023    3:36 PM 11/30/2022    1:56 PM 08/31/2022    2:27 PM  Depression screen PHQ 2/9  Decreased Interest 1 1 1 1  0  Down, Depressed, Hopeless 1 1 1 1  0  PHQ - 2 Score 2 2 2 2  0  Altered sleeping 2 2 1 1 1   Tired, decreased energy 2 2 1 2 1   Change in appetite 0 3 1 1 1   Feeling bad or failure about yourself  0 1 0  0  Trouble concentrating 1 2 0 0 1  Moving slowly or fidgety/restless 0 0 0 0  0  Suicidal thoughts 0 0 0 0 0  PHQ-9 Score 7 12 5 6 4   Difficult doing work/chores  Not difficult at all   Somewhat difficult       05/01/2023    4:26 PM 03/06/2023   10:31 AM 11/30/2022    1:57 PM 08/31/2022    2:27 PM  GAD 7 : Generalized Anxiety Score  Nervous, Anxious, on Edge 1 1 1 1   Control/stop worrying 1 2 1 1   Worry too much - different things 1 2 1 1   Trouble relaxing 1 1 1  0  Restless 0 1 0 0  Easily annoyed or irritable 1 1 1 1   Afraid - awful might happen 0 1 0 0  Total GAD 7 Score 5 9 5 4   Anxiety Difficulty Somewhat difficult Somewhat difficult  Not difficult at all   Pertinent labs & imaging results that were available during my care of the patient were reviewed by me and considered in my medical decision making.  Assessment & Plan:  Brigid was seen today for cough and runny nose .  Diagnoses and all orders for this visit:  Acute cough Will send in medications for symptom management. Discussed with patient that she can continue OTC treatments as well. Discussed with patient that testing for RSV, flu, and covid will not change treatment plan. Shared decision-making utilized to not test at this time. As patient is feeling better at this time, patient to notify provider for any signs of double worsening.  -     benzonatate (TESSALON PERLES) 100 MG capsule; Take 1 capsule (100 mg total) by mouth 3 (three) times daily as needed. -     lidocaine (HM LIDOCAINE PATCH) 4 %; Place 2 patches onto the skin daily.  Rib pain As above.  -     benzonatate (TESSALON PERLES) 100 MG capsule; Take 1 capsule (100 mg total) by mouth 3 (three) times daily as needed. -     lidocaine (HM LIDOCAINE PATCH) 4 %; Place 2 patches onto the skin daily.  Hypertension associated with diabetes (HCC) Elevated BP in office. Improved during exam. Patient to continue to monitor BP at home and follow up with measurements for review with Cardiology.   Continue all other maintenance  medications.  Follow up plan: Return if symptoms worsen or fail to improve.   Continue healthy lifestyle choices, including diet (  rich in fruits, vegetables, and lean proteins, and low in salt and simple carbohydrates) and exercise (at least 30 minutes of moderate physical activity daily).  Written and verbal instructions provided   The above assessment and management plan was discussed with the patient. The patient verbalized understanding of and has agreed to the management plan. Patient is aware to call the clinic if they develop any new symptoms or if symptoms persist or worsen. Patient is aware when to return to the clinic for a follow-up visit. Patient educated on when it is appropriate to go to the emergency department.   Neale Burly, DNP-FNP [facility] [address] [place], [place] [address] [phone]

## 2023-05-09 ENCOUNTER — Other Ambulatory Visit: Payer: Self-pay | Admitting: Family Medicine

## 2023-05-09 DIAGNOSIS — E1159 Type 2 diabetes mellitus with other circulatory complications: Secondary | ICD-10-CM

## 2023-05-11 ENCOUNTER — Other Ambulatory Visit: Payer: Self-pay | Admitting: *Deleted

## 2023-05-11 NOTE — Patient Outreach (Signed)
 Care Management   Visit Note  05/11/2023 Name: [name] MRN: [id number] DOB: August 03, 1940  Subjective: [name] is a 83 y.o. year old female who is a primary care patient of [name], Julie Campi, FNP. The Care Management team was consulted for assistance.      Engaged with patient spoke with patient by telephone.    Goals Addressed             This Visit's Progress    RNCM Care Management Expected Outcomes: Monitor, Self-Manage and Reduce Symptoms of: HTN, Afib, DM       Current Barriers:  Knowledge Deficits related to plan of care for management of Atrial Fibrillation, HTN, and DMII  Chronic Disease Management support and education needs related to Atrial Fibrillation, HTN, and DMII   RNCM Clinical Goal(s):  Patient will verbalize basic understanding of  Atrial Fibrillation, HTN, and DMII disease process and self health management plan as evidenced by verbal explanation, recognizing symptoms, lifestyle modifications attend all scheduled medical appointments: with primary care provider and specialists as evidenced by keeping all scheduled appointments demonstrate Improved and Ongoing adherence to prescribed treatment plan for Atrial Fibrillation, HTN, and DMII as evidenced by consistent medication compliance, symptom monitoring, continued lifestyle modifications continue to work with RN Care Manager to address care management and care coordination needs related to  Atrial Fibrillation, HTN, and DMII as evidenced by adherence to CM Team Scheduled appointments through collaboration with RN Care manager, provider, and care team.   Interventions: Evaluation of current treatment plan related to  self management and patient's adherence to plan as established by provider   AFIB Interventions: (Status:  Goal on track:  Yes.) Long Term Goal Counseled on increased risk of stroke due to Afib and benefits of anticoagulation for stroke prevention Reviewed importance of adherence to  anticoagulant exactly as prescribed. Reports compliance with Eliquis Advised patient to discuss any  new changes or falls with injury with provider Counseled on bleeding risk associated with Eliquis and importance of self-monitoring for signs/symptoms of bleeding. Reports that she has noticed some blood tinged mucous when blowing her nose. She does not report it being constant but only when she is blowing her nose. RNCM advised to try saline nasal spray but if it persists she needs to report nearest UC/ED. Patient verbalized full understanding at this time. Counseled on avoidance of NSAIDs due to increased bleeding risk with anticoagulants Counseled on importance of regular laboratory monitoring as prescribed Counseled on seeking medical attention after a head injury or if there is blood in the urine/stool. Denies any blood in urine or stool Afib action plan reviewed Screening for signs and symptoms of depression related to chronic disease state  Assessed social determinant of health barriers   Diabetes Interventions:  (Status:  Goal on track:  Yes.) Long Term Goal Assessed patient's understanding of A1c goal: <6.5% Provided education to patient about basic DM disease process. A1C below goal. Does not check regularly but states are readings always good when she does check. Reviewed medications with patient and discussed importance of medication adherence. Reports compliance with all medications Counseled on importance of regular laboratory monitoring as prescribed Discussed plans with patient for ongoing care management follow up and provided patient with direct contact information for care management team Provided patient with written educational materials related to hypo and hyperglycemia and importance of correct treatment. Denies any hypo/hyperglycemia. Reviewed scheduled/upcoming provider appointments including: 08-09-2023 with Rheumatology, 06-05-2023 with PCP Advised patient, providing  education and  rationale, to check cbg as recommended and record, calling provider for findings outside established parameters Review of patient status, including review of consultants reports, relevant laboratory and other test results, and medications completed Screening for signs and symptoms of depression related to chronic disease state  Assessed social determinant of health barriers Lab Results  Component Value Date   HGBA1C 5.9 (H) 03/06/2023    Hypertension Interventions:  (Status:  Goal on track:  Yes.) Long Term Goal Last practice recorded BP readings:  BP Readings from Last 3 Encounters:  05/01/23 (!) 143/56  04/10/23 110/60  03/06/23 138/84   Most recent eGFR/CrCl:  Lab Results  Component Value Date   EGFR 87 11/30/2022    No components found for: "CRCL"  Evaluation of current treatment plan related to hypertension self management and patient's adherence to plan as established by provider. Reports regularly checking her BP and states they are typically normal at home.  She does report that it is sometimes elevated above the goal. RNCM reviewed with patient goals for SBP<140 DBP<90 and advised for her to check BP daily and keep a record and provide when she reports to PCP in April. Provided education to patient re: stroke prevention, s/s of heart attack and stroke. Education and support provided. Reviewed medications with patient and discussed importance of compliance. Reports compliance with all medications. Counseled on adverse effects of illicit drug and excessive alcohol use in patients with high blood pressure. Denies illicit drug or excessive alcohol use Counseled on the importance of exercise goals with target of 150 minutes per week Discussed plans with patient for ongoing care management follow up and provided patient with direct contact information for care management team Advised patient, providing education and rationale, to monitor blood pressure daily and record,  calling PCP for findings outside established parameters Reviewed scheduled/upcoming provider appointments including: 08-09-2023 with Rheumatology, 06-05-2023 with PCP Advised patient to discuss acute changes with provider Provided education on prescribed diet Heart Healthy/ADA Discussed complications of poorly controlled blood pressure such as heart disease, stroke, circulatory complications, vision complications, kidney impairment, sexual dysfunction Screening for signs and symptoms of depression related to chronic disease state  Assessed social determinant of health barriers  Patient Goals/Self-Care Activities: Take all medications as prescribed Attend all scheduled provider appointments Call pharmacy for medication refills 3-7 days in advance of running out of medications Attend church or other social activities Perform all self care activities independently  Perform IADL's (shopping, preparing meals, housekeeping, managing finances) independently Call provider office for new concerns or questions  schedule appointment with eye doctor check feet daily for cuts, sores or redness take medicine as prescribed check blood pressure daily write blood pressure results in a log or diary take blood pressure log to all doctor appointments  Follow Up Plan:  Telephone follow up appointment with care management team member scheduled for:  06-12-2023 at 11:45 am           Consent to Services:  Patient was given information about care management services, agreed to services, and gave verbal consent to participate.   Plan: Telephone follow up appointment with care management team member scheduled for:06-12-2023 at 11:45 am  Larey Brick, BSN RN [facility], [facility] RN Care Manager Direct Dial: [phone]  Fax: [phone]

## 2023-05-11 NOTE — Patient Instructions (Signed)
 Visit Information  Thank you for taking time to visit with me today. Please don't hesitate to contact me if I can be of assistance to you before our next scheduled telephone appointment.  Following are the goals we discussed today:   Goals Addressed             This Visit's Progress    RNCM Care Management Expected Outcomes: Monitor, Self-Manage and Reduce Symptoms of: HTN, Afib, DM       Current Barriers:  Knowledge Deficits related to plan of care for management of Atrial Fibrillation, HTN, and DMII  Chronic Disease Management support and education needs related to Atrial Fibrillation, HTN, and DMII   RNCM Clinical Goal(s):  Patient will verbalize basic understanding of  Atrial Fibrillation, HTN, and DMII disease process and self health management plan as evidenced by verbal explanation, recognizing symptoms, lifestyle modifications attend all scheduled medical appointments: with primary care provider and specialists as evidenced by keeping all scheduled appointments demonstrate Improved and Ongoing adherence to prescribed treatment plan for Atrial Fibrillation, HTN, and DMII as evidenced by consistent medication compliance, symptom monitoring, continued lifestyle modifications continue to work with RN Care Manager to address care management and care coordination needs related to  Atrial Fibrillation, HTN, and DMII as evidenced by adherence to CM Team Scheduled appointments through collaboration with RN Care manager, provider, and care team.   Interventions: Evaluation of current treatment plan related to  self management and patient's adherence to plan as established by provider   AFIB Interventions: (Status:  Goal on track:  Yes.) Long Term Goal Counseled on increased risk of stroke due to Afib and benefits of anticoagulation for stroke prevention Reviewed importance of adherence to anticoagulant exactly as prescribed. Reports compliance with Eliquis Advised patient to discuss any   new changes or falls with injury with provider Counseled on bleeding risk associated with Eliquis and importance of self-monitoring for signs/symptoms of bleeding. Reports that she has noticed some blood tinged mucous when blowing her nose. She does not report it being constant but only when she is blowing her nose. RNCM advised to try saline nasal spray but if it persists she needs to report nearest UC/ED. Patient verbalized full understanding at this time. Counseled on avoidance of NSAIDs due to increased bleeding risk with anticoagulants Counseled on importance of regular laboratory monitoring as prescribed Counseled on seeking medical attention after a head injury or if there is blood in the urine/stool. Denies any blood in urine or stool Afib action plan reviewed Screening for signs and symptoms of depression related to chronic disease state  Assessed social determinant of health barriers   Diabetes Interventions:  (Status:  Goal on track:  Yes.) Long Term Goal Assessed patient's understanding of A1c goal: <6.5% Provided education to patient about basic DM disease process. A1C below goal. Does not check regularly but states are readings always good when she does check. Reviewed medications with patient and discussed importance of medication adherence. Reports compliance with all medications Counseled on importance of regular laboratory monitoring as prescribed Discussed plans with patient for ongoing care management follow up and provided patient with direct contact information for care management team Provided patient with written educational materials related to hypo and hyperglycemia and importance of correct treatment. Denies any hypo/hyperglycemia. Reviewed scheduled/upcoming provider appointments including: 08-09-2023 with Rheumatology, 06-05-2023 with PCP Advised patient, providing education and rationale, to check cbg as recommended and record, calling provider for findings outside  established parameters Review of patient status, including  review of consultants reports, relevant laboratory and other test results, and medications completed Screening for signs and symptoms of depression related to chronic disease state  Assessed social determinant of health barriers Lab Results  Component Value Date   HGBA1C 5.9 (H) 03/06/2023    Hypertension Interventions:  (Status:  Goal on track:  Yes.) Long Term Goal Last practice recorded BP readings:  BP Readings from Last 3 Encounters:  05/01/23 (!) 143/56  04/10/23 110/60  03/06/23 138/84   Most recent eGFR/CrCl:  Lab Results  Component Value Date   EGFR 87 11/30/2022    No components found for: "CRCL"  Evaluation of current treatment plan related to hypertension self management and patient's adherence to plan as established by provider. Reports regularly checking her BP and states they are typically normal at home.  She does report that it is sometimes elevated above the goal. RNCM reviewed with patient goals for SBP<140 DBP<90 and advised for her to check BP daily and keep a record and provide when she reports to PCP in April. Provided education to patient re: stroke prevention, s/s of heart attack and stroke. Education and support provided. Reviewed medications with patient and discussed importance of compliance. Reports compliance with all medications. Counseled on adverse effects of illicit drug and excessive alcohol use in patients with high blood pressure. Denies illicit drug or excessive alcohol use Counseled on the importance of exercise goals with target of 150 minutes per week Discussed plans with patient for ongoing care management follow up and provided patient with direct contact information for care management team Advised patient, providing education and rationale, to monitor blood pressure daily and record, calling PCP for findings outside established parameters Reviewed scheduled/upcoming provider  appointments including: 08-09-2023 with Rheumatology, 06-05-2023 with PCP Advised patient to discuss acute changes with provider Provided education on prescribed diet Heart Healthy/ADA Discussed complications of poorly controlled blood pressure such as heart disease, stroke, circulatory complications, vision complications, kidney impairment, sexual dysfunction Screening for signs and symptoms of depression related to chronic disease state  Assessed social determinant of health barriers  Patient Goals/Self-Care Activities: Take all medications as prescribed Attend all scheduled provider appointments Call pharmacy for medication refills 3-7 days in advance of running out of medications Attend church or other social activities Perform all self care activities independently  Perform IADL's (shopping, preparing meals, housekeeping, managing finances) independently Call provider office for new concerns or questions  schedule appointment with eye doctor check feet daily for cuts, sores or redness take medicine as prescribed check blood pressure daily write blood pressure results in a log or diary take blood pressure log to all doctor appointments  Follow Up Plan:  Telephone follow up appointment with care management team member scheduled for:  06-12-2023 at 11:45 am           Our next appointment is by telephone on 06-12-2023 at 11:45 am  Please call the care guide team at [phone] if you need to cancel or reschedule your appointment.   If you are experiencing a Mental Health or Behavioral Health Crisis or need someone to talk to, please call the Suicide and Crisis Lifeline: 988 call the [place] National Suicide Prevention Lifeline: 973-349-1465 or TTY: 2723834276 TTY 337-073-0259) to talk to a trained counselor call 1-800-273-TALK (toll free, 24 hour hotline)   Patient verbalizes understanding of instructions and care plan provided today and agrees to view in MyChart. Active MyChart  status and patient understanding of how to access instructions and care plan  via MyChart confirmed with patient.     Telephone follow up appointment with care management team member scheduled for:06-12-2023 at 11:45 am  Larey Brick, BSN RN [facility], [facility] RN Care Manager Direct Dial: [phone]  Fax: [phone]    Nosebleed, Adult A nosebleed is when blood comes out of the nose. Nosebleeds are common and can be caused by many things. They are usually not a sign of a serious medical problem. Follow these instructions at home: When you have a nosebleed:  Sit down. Tilt your head forward a little. Follow these steps: Pinch your nose with a clean towel or tissue. Keep pinching your nose for 5 minutes. Do not let go. After 5 minutes, let go of your nose. Keep doing these steps until the bleeding stops. Do not put tissues or other things in your nose to stop the bleeding. Avoid lying down or putting your head back. Use a nose spray decongestant as told by your doctor. After a nosebleed: Try not to blow your nose or sniffle for several hours. Try not to strain, lift, or bend at the waist for several days. Aspirin and medicines that thin your blood make bleeding more likely. If you take these medicines: Ask your doctor if you should stop taking them or if you should change how much you take. Do not stop taking the medicine unless your doctor tells you to. If your nosebleed was caused by dryness, use over-the-counter saline nasal spray or gel and a humidifier as told by your doctor. This will keep the inside of your nose moist and allow it to heal. If you need to use nasal spray or gel: Choose one that is water-soluble. Use only as much as you need and use it only as often as needed. Do not lie down right away after you use it. If you get nosebleeds often, talk with your doctor about treatments. These may include: Nasal cautery. A chemical  swab or electrical device is used to lightly burn tiny blood vessels inside the nose. This helps stop or prevent nosebleeds. Nasal packing. A gauze or other material is placed in the nose to keep constant pressure on the bleeding area. Contact a doctor if: You have a fever. You get nosebleeds often. You get nosebleeds more often than usual. You bruise very easily. You have something stuck in your nose. You are bleeding in your mouth. You vomit or cough up brown material. You get a nosebleed after you start a new medicine. Get help right away if: You have a nosebleed after you fall or hurt your head. Your nosebleed does not go away after 20 minutes. You feel dizzy or weak. You have unusual bleeding from other parts of your body. You have unusual bruising on other parts of your body. You get sweaty. You vomit blood. Summary Nosebleeds are common. They are usually not a sign of a serious medical problem. When you have a nosebleed, sit down and tilt your head a little forward. Pinch your nose with a clean tissue for 5 minutes. Use saline spray or saline gel and a humidifier as told by your doctor. Get help right away if your nosebleed does not go away after 20 minutes. This information is not intended to replace advice given to you by your health care provider. Make sure you discuss any questions you have with your health care provider. Document Revised: 02/15/2021 Document Reviewed: 02/15/2021 Elsevier Patient Education  2024 [employer].

## 2023-05-21 ENCOUNTER — Ambulatory Visit (INDEPENDENT_AMBULATORY_CARE_PROVIDER_SITE_OTHER): Payer: Medicare Other

## 2023-05-21 DIAGNOSIS — I4891 Unspecified atrial fibrillation: Secondary | ICD-10-CM

## 2023-05-22 LAB — CUP PACEART REMOTE DEVICE CHECK
Date Time Interrogation Session: 20250330230437
Implantable Pulse Generator Implant Date: 20220210

## 2023-05-23 NOTE — Progress Notes (Signed)
 Carelink Summary Report / Loop Recorder

## 2023-05-23 NOTE — Addendum Note (Signed)
 Addended by: Geralyn Flash D on: 05/23/2023 01:07 PM   Modules accepted: Orders

## 2023-05-31 ENCOUNTER — Encounter: Payer: Self-pay | Admitting: Cardiovascular Disease

## 2023-06-05 ENCOUNTER — Encounter: Payer: Self-pay | Admitting: Family Medicine

## 2023-06-05 ENCOUNTER — Ambulatory Visit: Payer: 59 | Admitting: Family Medicine

## 2023-06-05 VITALS — BP 113/68 | HR 69 | Temp 98.0°F | Ht 64.0 in | Wt 145.0 lb

## 2023-06-05 DIAGNOSIS — F5101 Primary insomnia: Secondary | ICD-10-CM | POA: Diagnosis not present

## 2023-06-05 DIAGNOSIS — M79605 Pain in left leg: Secondary | ICD-10-CM

## 2023-06-05 DIAGNOSIS — M25551 Pain in right hip: Secondary | ICD-10-CM

## 2023-06-05 DIAGNOSIS — G8929 Other chronic pain: Secondary | ICD-10-CM | POA: Diagnosis not present

## 2023-06-05 DIAGNOSIS — M25552 Pain in left hip: Secondary | ICD-10-CM | POA: Diagnosis not present

## 2023-06-05 DIAGNOSIS — F418 Other specified anxiety disorders: Secondary | ICD-10-CM

## 2023-06-05 DIAGNOSIS — M545 Low back pain, unspecified: Secondary | ICD-10-CM | POA: Diagnosis not present

## 2023-06-05 DIAGNOSIS — M549 Dorsalgia, unspecified: Secondary | ICD-10-CM | POA: Diagnosis not present

## 2023-06-05 DIAGNOSIS — F339 Major depressive disorder, recurrent, unspecified: Secondary | ICD-10-CM

## 2023-06-05 DIAGNOSIS — Z79899 Other long term (current) drug therapy: Secondary | ICD-10-CM

## 2023-06-05 NOTE — Progress Notes (Addendum)
 Subjective:  Patient ID: [name], female    DOB: [birth year], 83 y.o.   MRN: [id number]  Patient Care Team: Arrie Senate, FNP as PCP - General (Family Medicine) Jonelle Sidle, MD as PCP - Cardiology (Cardiology) Mealor, Roberts Gaudy, MD as PCP - Electrophysiology (Cardiology) Newman Pies, MD as Consulting Physician (Otolaryngology) Danella Maiers, [facility] (Pharmacist) Dani Gobble, NP as Nurse Practitioner (Endocrinology) [facility] Ricky Stabs, RN as VBCI Care Management (General Practice)   Chief Complaint:  Medical Management of Chronic Issues (3 month follow up/No acute concerns)   HPI: CHAMILLE Matthews is a 83 y.o. female presenting on 06/05/2023 for Medical Management of Chronic Issues (3 month follow up/No acute concerns)  HPI 1. Chronic Pain  States that she is about the same. Reports that she "cannot do nothing". She has good days and bad days. Reports that she can do some tasks, but then she has to rest. She is taking norco as needed. Taking 1-2 times per day on average. Sometimes she is not taking any. She uses tylenol arthritis as well.   2. Anxiety with depression States that her grandchildren worry her. Her son is currently living with her and is keeping grandchildren at her house. Oldest grandson is autistic and is hard for her to manage. She is compliant with cymbalta. Denies any side effects. She is taking trazodone for sleep. Reports that it is not working as well. Reports that sometimes she will lie there for a few hours. She will skip a few nights and then it will work for her. Denies thoughts of self harm.   Relevant past medical, surgical, family, and social history reviewed and updated as indicated.  Allergies and medications reviewed and updated. Data reviewed: Chart in Epic.   Past Medical History:  Diagnosis Date   Anxiety    Arthritis    Bulging lumbar disc    Chronic lower back pain    Coronary  atherosclerosis of native coronary artery    DES LAD 02/2008, DES LAD 02/2015, DES ramus and DES x2 mid RCA 02/2020   Depression    Dyslipidemia    Essential hypertension    Facial numbness    Frequent headaches    GERD (gastroesophageal reflux disease)    Glaucoma    Heart attack (HCC) 02/2020   Insomnia    Paroxysmal atrial fibrillation (HCC)    Diagnosed October 2019   PSVT (paroxysmal supraventricular tachycardia) (HCC)    Refusal of blood transfusions as patient is Jehovah's Witness    Thyroid disease    Type 2 diabetes mellitus (HCC)    Borderline   Vitamin D deficiency     Past Surgical History:  Procedure Laterality Date   ATRIAL FIBRILLATION ABLATION N/A 02/16/2021   Procedure: ATRIAL FIBRILLATION ABLATION;  Surgeon: Hillis Range, MD;  Location: [facility] INVASIVE CV LAB;  Service: Cardiovascular;  Laterality: N/A;   BIOPSY  09/21/2021   Procedure: BIOPSY;  Surgeon: Corbin Ade, MD;  Location: [facility] ENDO SUITE;  Service: Endoscopy;;  gastric   CARDIAC CATHETERIZATION N/A 02/23/2015   Procedure: Left Heart Cath and Coronary Angiography;  Surgeon: Tonny Bollman, MD;  Location: [facility] INVASIVE CV LAB;  Service: Cardiovascular;  Laterality: N/A;   CARDIAC CATHETERIZATION N/A 02/23/2015   Procedure: Coronary Stent Intervention;  Surgeon: Tonny Bollman, MD;  Location: [facility] INVASIVE CV LAB;  Service: Cardiovascular;  Laterality: N/A;   CARDIAC CATHETERIZATION N/A 01/04/2016   Procedure: Left Heart  Cath and Coronary Angiography;  Surgeon: Peter M Swaziland, MD;  Location: [facility] INVASIVE CV LAB;  Service: Cardiovascular;  Laterality: N/A;   CATARACT EXTRACTION     CORONARY ANGIOPLASTY  02/23/2015   CORONARY ANGIOPLASTY WITH STENT PLACEMENT  2009   CORONARY STENT INTERVENTION N/A 03/09/2020   Procedure: CORONARY STENT INTERVENTION;  Surgeon: Corky Crafts, MD;  Location: [facility] INVASIVE CV LAB;  Service: Cardiovascular;  Laterality: N/A;   DILATION AND CURETTAGE OF UTERUS      ESOPHAGOGASTRODUODENOSCOPY (EGD) WITH PROPOFOL N/A 09/21/2021   normal esophagus, abnormal gastric mucosa s/p biopsy, normal duodenum. Pathology with H.pylori. Prescribed Prevpac.   implantable loop recorder placement  04/01/2020   Medtronic Reveal Warner model LNQ 22 [id number]) implantable loop recorder    LEFT HEART CATH AND CORONARY ANGIOGRAPHY N/A 03/09/2020   Procedure: LEFT HEART CATH AND CORONARY ANGIOGRAPHY;  Surgeon: Corky Crafts, MD;  Location: [facility] INVASIVE CV LAB;  Service: Cardiovascular;  Laterality: N/A;   TUBAL LIGATION      Social History   Socioeconomic History   Marital status: Widowed    Spouse name: Not on file   Number of children: 8   Years of education: 10th grade   Highest education level: 10th grade  Occupational History   Occupation: RETIRED  Tobacco Use   Smoking status: Never   Smokeless tobacco: Never   Tobacco comments:    Never smoke 11/15/21  Vaping Use   Vaping status: Never Used  Substance and Sexual Activity   Alcohol use: Not Currently    Alcohol/week: 1.0 standard drink of alcohol    Types: 1 Glasses of wine per week    Comment: RARE OCCASION   Drug use: No   Sexual activity: Not Currently  Other Topics Concern   Not on file  Social History Narrative   Lives at home alone.   Right-handed.   No caffeine use.   Daughter lives next door and helps her out   Social Drivers of Health   Financial Resource Strain: Low Risk  (03/21/2023)   Received from [facility]   Overall Financial Resource Strain (CARDIA)    Difficulty of Paying Living Expenses: Not very hard  Food Insecurity: No Food Insecurity (03/21/2023)   Received from [facility]   Hunger Vital Sign    Worried About Running Out of Food in the Last Year: Never true    Ran Out of Food in the Last Year: Never true  Transportation Needs: No Transportation Needs (03/21/2023)   Received from [facility] - Transportation    Lack of Transportation (Medical): No     Lack of Transportation (Non-Medical): No  Physical Activity: Inactive (03/05/2023)   Exercise Vital Sign    Days of Exercise per Week: 0 days    Minutes of Exercise per Session: 0 min  Stress: No Stress Concern Present (03/05/2023)   [employer] of Occupational Health - Occupational Stress Questionnaire    Feeling of Stress : Not at all  Social Connections: Moderately Isolated (03/05/2023)   Social Connection and Isolation Panel [NHANES]    Frequency of Communication with Friends and Family: More than three times a week    Frequency of Social Gatherings with Friends and Family: Three times a week    Attends Religious Services: More than 4 times per year    Active Member of Clubs or Organizations: No    Attends Banker Meetings: Never    Marital Status: Widowed  Catering manager  Violence: Not At Risk (03/05/2023)   Humiliation, Afraid, Rape, and Kick questionnaire    Fear of Current or Ex-Partner: No    Emotionally Abused: No    Physically Abused: No    Sexually Abused: No    Outpatient Encounter Medications as of 06/05/2023  Medication Sig   acetaminophen (TYLENOL) 500 MG tablet Take 1,000 mg by mouth every 6 (six) hours as needed for moderate pain.   Ascorbic Acid (VITAMIN C) 500 MG tablet Take 500 mg by mouth daily.     bimatoprost (LUMIGAN) 0.01 % SOLN Place 1 drop into both eyes at bedtime.   Calcium Carbonate Antacid (TUMS PO) Take 2 tablets by mouth 4 (four) times daily as needed (for acid reflux/indigestion).    Camphor-Menthol-Methyl Sal (SALONPAS) 3.02-26-08 % PTCH Apply 1 patch topically daily as needed (pain).   cetirizine (ZYRTEC) 10 MG tablet Take 10 mg by mouth daily as needed for allergies.   Cyanocobalamin (VITAMIN B 12 PO) Take 1 tablet by mouth daily.   diclofenac Sodium (VOLTAREN) 1 % GEL Apply 2 g topically 4 (four) times daily. (Patient taking differently: Apply 2 g topically 4 (four) times daily as needed (pain).)   diltiazem (CARDIZEM CD) 300  MG 24 hr capsule TAKE ONE CAPSULE BY MOUTH DAILY   DULoxetine (CYMBALTA) 20 MG capsule TAKE ONE CAPSULE BY MOUTH DAILY   DULoxetine (CYMBALTA) 30 MG capsule Take 30 mg by mouth daily.   ELIQUIS 5 MG TABS tablet TAKE ONE TABLET BY MOUTH TWICE DAILY   GARLIC PO Take 1 tablet by mouth daily.   HYDROcodone-acetaminophen (NORCO) 7.5-325 MG tablet Take 1 tablet by mouth daily as needed for moderate pain (pain score 4-6).   levothyroxine (SYNTHROID) 25 MCG tablet Take 1 tablet (25 mcg total) by mouth daily before breakfast.   lidocaine (HM LIDOCAINE PATCH) 4 % Place 2 patches onto the skin daily.   losartan (COZAAR) 50 MG tablet TAKE ONE (1) TABLET BY MOUTH EVERY DAY   MAGNESIUM CARBONATE PO Take 1 tablet by mouth at bedtime.   metFORMIN (GLUCOPHAGE) 500 MG tablet TAKE 1 TABLET BY MOUTH TWICE DAILY WITH MEALS   metoprolol succinate (TOPROL-XL) 100 MG 24 hr tablet TAKE 1 TABLET BY MOUTH DAILY WITH FOOD   nitroGLYCERIN (NITROSTAT) 0.4 MG SL tablet DISSOLVE 1 TAB UNDER TOUNGE FOR CHEST PAIN. MAY REPEAT EVERY 5 MINUTES FOR 3 DOSES. IF NO RELIEF CALL 911 OR GO TO ER   rosuvastatin (CRESTOR) 20 MG tablet TAKE ONE (1) TABLET BY MOUTH EVERY DAY   SPIKEVAX syringe    timolol (TIMOPTIC) 0.5 % ophthalmic solution Place 1 drop into both eyes daily.   traZODone (DESYREL) 150 MG tablet TAKE ONE TABLET BY MOUTH AT BEDTIME   TURMERIC PO Take 1 tablet by mouth daily.   VENTOLIN HFA 108 (90 Base) MCG/ACT inhaler Inhale 1-2 puffs into the lungs every 4 (four) hours as needed for shortness of breath.   Vitamin D, Cholecalciferol, 10 MCG (400 UNIT) CAPS Take 400 Units by mouth daily.   [DISCONTINUED] benzonatate (TESSALON PERLES) 100 MG capsule Take 1 capsule (100 mg total) by mouth 3 (three) times daily as needed.   No facility-administered encounter medications on file as of 06/05/2023.    No Known Allergies  Review of Systems As per HPI  Objective:  BP 113/68   Pulse 69   Temp 98 F (36.7 C)   Ht 5\' 4"   (1.626 m)   Wt 145 lb (65.8 kg)   LMP  (  LMP Unknown)   SpO2 95%   BMI 24.89 kg/m    Wt Readings from Last 3 Encounters:  06/05/23 145 lb (65.8 kg)  05/01/23 147 lb (66.7 kg)  04/10/23 148 lb 9.6 oz (67.4 kg)    Physical Exam Constitutional:      General: She is awake. She is not in acute distress.    Appearance: Normal appearance. She is well-developed and well-groomed. She is not ill-appearing, toxic-appearing or diaphoretic.  Cardiovascular:     Rate and Rhythm: Normal rate and regular rhythm.     Pulses: Normal pulses.          Radial pulses are 2+ on the right side and 2+ on the left side.       Posterior tibial pulses are 2+ on the right side and 2+ on the left side.     Heart sounds: Murmur heard.     Systolic murmur is present with a grade of 5/6.     No gallop.  Pulmonary:     Effort: Pulmonary effort is normal. No respiratory distress.     Breath sounds: Normal breath sounds. No stridor. No wheezing, rhonchi or rales.  Musculoskeletal:     Cervical back: Full passive range of motion without pain and neck supple.     Right lower leg: No edema.     Left lower leg: No edema.  Skin:    General: Skin is warm.     Capillary Refill: Capillary refill takes less than 2 seconds.  Neurological:     General: No focal deficit present.     Mental Status: She is alert, oriented to person, place, and time and easily aroused. Mental status is at baseline.     GCS: GCS eye subscore is 4. GCS verbal subscore is 5. GCS motor subscore is 6.     Motor: No weakness.  Psychiatric:        Attention and Perception: Attention and perception normal.        Mood and Affect: Mood and affect normal.        Speech: Speech normal.        Behavior: Behavior normal. Behavior is cooperative.        Thought Content: Thought content normal. Thought content does not include homicidal or suicidal ideation. Thought content does not include homicidal or suicidal plan.        Cognition and Memory:  Cognition and memory normal.        Judgment: Judgment normal.     Results for orders placed or performed in visit on 05/21/23  CUP PACEART REMOTE DEVICE CHECK   Collection Time: 05/20/23 11:04 PM  Result Value Ref Range   Date Time Interrogation Session [id number]    Pulse Generator Manufacturer MERM    Pulse Gen Model LNQ22 LINQ II    Pulse Gen Serial Number [id number]    Clinic Name [facility]    Implantable Pulse Generator Type ICM/ILR    Implantable Pulse Generator Implant Date [id number]    Eval Rhythm SR at 80 bpm/PACs        06/05/2023   10:20 AM 05/01/2023    4:24 PM 03/06/2023   10:30 AM 03/05/2023    3:36 PM 11/30/2022    1:56 PM  Depression screen PHQ 2/9  Decreased Interest 1 1 1 1 1   Down, Depressed, Hopeless 0 1 1 1 1   PHQ - 2 Score 1 2 2 2 2   Altered sleeping 1 2 2 1  1  Tired, decreased energy 2 2 2 1 2   Change in appetite 2 0 3 1 1   Feeling bad or failure about yourself  1 0 1 0   Trouble concentrating 1 1 2  0 0  Moving slowly or fidgety/restless 0 0 0 0 0  Suicidal thoughts 0 0 0 0 0  PHQ-9 Score 8 7 12 5 6   Difficult doing work/chores   Not difficult at all         06/05/2023   10:20 AM 05/01/2023    4:26 PM 03/06/2023   10:31 AM 11/30/2022    1:57 PM  GAD 7 : Generalized Anxiety Score  Nervous, Anxious, on Edge 1 1 1 1   Control/stop worrying 1 1 2 1   Worry too much - different things 1 1 2 1   Trouble relaxing 0 1 1 1   Restless 0 0 1 0  Easily annoyed or irritable 0 1 1 1   Afraid - awful might happen 0 0 1 0  Total GAD 7 Score 3 5 9 5   Anxiety Difficulty Somewhat difficult Somewhat difficult Somewhat difficult    Pertinent labs & imaging results that were available during my care of the patient were reviewed by me and considered in my medical decision making.  Assessment & Plan:  Thomasa was seen today for medical management of chronic issues.  Diagnoses and all orders for this visit:  1. Chronic hip pain, bilateral (Primary) Patient  unable to complete urine. Will complete at follow up.  CSA completed.  PDMP reviewed. No red flags. Continue current regimen.  - ToxASSURE Select 13 (MW), Urine  2. Chronic midline low back pain without sciatica As above.  - ToxASSURE Select 13 (MW), Urine  3. Pain of back and left lower extremity As above.  - ToxASSURE Select 13 (MW), Urine  4. Anxiety with depression Stable. Denies SI. Declines counseling. Will continue current regimen.   5. Depression, recurrent (HCC) As above.   6. Controlled substance agreement signed As above.  - ToxASSURE Select 13 (MW), Urine  7. Primary insomnia Currently stable. Can consider switching to doxepin.    Continue all other maintenance medications.  Follow up plan: Return in about 4 weeks (around 07/03/2023) for Diabetes follow up .   Continue healthy lifestyle choices, including diet (rich in fruits, vegetables, and lean proteins, and low in salt and simple carbohydrates) and exercise (at least 30 minutes of moderate physical activity daily).  Written and verbal instructions provided   The above assessment and management plan was discussed with the patient. The patient verbalized understanding of and has agreed to the management plan. Patient is aware to call the clinic if they develop any new symptoms or if symptoms persist or worsen. Patient is aware when to return to the clinic for a follow-up visit. Patient educated on when it is appropriate to go to the emergency department.   Jacqualyn Mates, DNP-FNP [facility] [address] [place], [place] [address] [phone]

## 2023-06-05 NOTE — Patient Instructions (Signed)
 Bring in BP monitor with you to next appt.

## 2023-06-07 ENCOUNTER — Telehealth: Payer: Self-pay

## 2023-06-07 NOTE — Telephone Encounter (Signed)
 Copied from CRM [id number]. Topic: General - Other >> Jun 06, 2023  5:28 PM Felizardo Hotter wrote: Reason for CRM: Received call from [facility] per Derek ph: [phone] PVD Left foot 0.4 and right foot .072.

## 2023-06-12 ENCOUNTER — Other Ambulatory Visit: Payer: Self-pay

## 2023-06-12 ENCOUNTER — Other Ambulatory Visit: Payer: Self-pay | Admitting: *Deleted

## 2023-06-12 NOTE — Patient Outreach (Signed)
 Complex Care Management   Visit Note  06/12/2023  Name:  [name] MRN: [id number] DOB: Sep 21, 1940  Situation: Referral received for Complex Care Management related to Diabetes with Complications and Atrial Fibrillation I obtained verbal consent from Patient.  Visit completed with patient  on the phone  Background:   Past Medical History:  Diagnosis Date   Anxiety    Arthritis    Bulging lumbar disc    Chronic lower back pain    Coronary atherosclerosis of native coronary artery    DES LAD 02/2008, DES LAD 02/2015, DES ramus and DES x2 mid RCA 02/2020   Depression    Dyslipidemia    Essential hypertension    Facial numbness    Frequent headaches    GERD (gastroesophageal reflux disease)    Glaucoma    Heart attack (HCC) 02/2020   Insomnia    Paroxysmal atrial fibrillation (HCC)    Diagnosed October 2019   PSVT (paroxysmal supraventricular tachycardia) (HCC)    Refusal of blood transfusions as patient is Jehovah's Witness    Thyroid  disease    Type 2 diabetes mellitus (HCC)    Borderline   Vitamin D  deficiency     Assessment: Patient Reported Symptoms:  Cognitive Cognitive Status: Alert and oriented to person, place, and time Cognitive/Intellectual Conditions Management [RPT]: None reported or documented in medical history or problem list   Health Maintenance Behaviors: Annual physical exam Healing Pattern: Average Health Facilitated by: Rest  Neurological Neurological Review of Symptoms: No symptoms reported Neurological Self-Management Outcome: 4 (good)  HEENT HEENT Symptoms Reported: No symptoms reported HEENT Self-Management Outcome: 4 (good)    Cardiovascular Cardiovascular Symptoms Reported: No symptoms reported Does patient have uncontrolled Hypertension?: No Cardiovascular Conditions: Hypertension Cardiovascular Self-Management Outcome: 3 (uncertain)  Respiratory Respiratory Symptoms Reported: No symptoms reported Respiratory Self-Management Outcome: 4  (good)  Endocrine Patient reports the following symptoms related to hypoglycemia or hyperglycemia : No symptoms reported Is patient diabetic?: Yes Is patient checking blood sugars at home?: No (States that she has difficulty doing fingersticks) Endocrine Conditions: Diabetes, Thyroid  disorder Endocrine Management Strategies: Medication therapy, Routine screening Endocrine Self-Management Outcome: 3 (uncertain)  Gastrointestinal Gastrointestinal Symptoms Reported: No symptoms reported Gastrointestinal Self-Management Outcome: 4 (good) Nutrition Risk Screen (CP): No indicators present  Genitourinary Genitourinary Symptoms Reported: No symptoms reported Genitourinary Self-Management Outcome: 4 (good)  Integumentary Integumentary Symptoms Reported: No symptoms reported Skin Self-Management Outcome: 4 (good)  Musculoskeletal Musculoskelatal Symptoms Reviewed: No symptoms reported Musculoskeletal Self-Management Outcome: 4 (good) Falls in the past year?: No Number of falls in past year: 1 or less Was there an injury with Fall?: No Fall Risk Category Calculator: 0 Patient Fall Risk Level: Low Fall Risk Patient at Risk for Falls Due to: No Fall Risks Fall risk Follow up: Falls evaluation completed  Psychosocial Psychosocial Symptoms Reported: No symptoms reported Behavioral Health Self-Management Outcome: 4 (good) Major Change/Loss/Stressor/Fears (CP): Denies Techniques to Cope with Loss/Stress/Change: Not applicable Quality of Family Relationships: helpful, involved, supportive Do you feel physically threatened by others?: No      06/12/2023   12:16 PM  Depression screen PHQ 2/9  Decreased Interest 1  Down, Depressed, Hopeless 0  PHQ - 2 Score 1    Vitals:   06/12/23 0915  BP: (!) 160/60    Medications Reviewed Today     Reviewed by Remona Carmel, RN (Registered Nurse) on 06/12/23 at 1205  Med List Status: <None>   Medication Order Taking? Sig Documenting Provider Last  Dose Status  Informant  acetaminophen  (TYLENOL ) 500 MG tablet [id number] Yes Take 1,000 mg by mouth every 6 (six) hours as needed for moderate pain. [provider] Taking Active Multiple Informants  Ascorbic Acid  (VITAMIN C ) 500 MG tablet [id number] Yes Take 500 mg by mouth daily.   [provider] Taking Active Multiple Informants  bimatoprost (LUMIGAN) 0.01 % SOLN [id number] Yes Place 1 drop into both eyes at bedtime. [provider] Taking Active Multiple Informants  Calcium  Carbonate Antacid (TUMS PO) [id number] Yes Take 2 tablets by mouth 4 (four) times daily as needed (for acid reflux/indigestion).  [provider] Taking Active Multiple Informants  Camphor-Menthol -Methyl Sal (SALONPAS ) 3.02-26-08 % PTCH [id number] Yes Apply 1 patch topically daily as needed (pain). [provider] Taking Active Multiple Informants  cetirizine (ZYRTEC) 10 MG tablet [id number] Yes Take 10 mg by mouth daily as needed for allergies. [provider] Taking Active Multiple Informants  Cyanocobalamin  (VITAMIN B 12 PO) [id number] Yes Take 1 tablet by mouth daily. [provider] Taking Active Multiple Informants  diclofenac  Sodium (VOLTAREN ) 1 % GEL [id number] Yes Apply 2 g topically 4 (four) times daily.  Patient taking differently: Apply 2 g topically 4 (four) times daily as needed (pain).   Lorel Roes, NP Taking Active Multiple Informants  diltiazem  (CARDIZEM  CD) 300 MG 24 hr capsule [id number] Yes TAKE ONE CAPSULE BY MOUTH DAILY Gerard Knight, MD Taking Active   DULoxetine  (CYMBALTA ) 20 MG capsule [id number] Yes TAKE ONE CAPSULE BY MOUTH DAILY Milian, Winda Hastings, FNP Taking Active   DULoxetine  (CYMBALTA ) 30 MG capsule [id number] Yes Take 30 mg by mouth daily. [provider] Taking Active   ELIQUIS  5 MG TABS tablet [id number] Yes TAKE ONE TABLET BY MOUTH TWICE DAILY Gerard Knight, MD Taking Active   GARLIC  PO [id number] Yes Take 1 tablet by  mouth daily. [provider] Taking Active Multiple Informants  HYDROcodone -acetaminophen  (NORCO) 7.5-325 MG tablet [id number] Yes Take 1 tablet by mouth daily as needed for moderate pain (pain score 4-6). Chrystine Crate, FNP Taking Active   levothyroxine  (SYNTHROID ) 25 MCG tablet [id number] Yes Take 1 tablet (25 mcg total) by mouth daily before breakfast. Wendel Hals, NP Taking Active   lidocaine  (HM LIDOCAINE  PATCH) 4 % [id number] Yes Place 2 patches onto the skin daily. Chrystine Crate, FNP Taking Active   losartan  (COZAAR ) 50 MG tablet [id number] Yes TAKE ONE (1) TABLET BY MOUTH EVERY DAY Gerard Knight, MD Taking Active   MAGNESIUM  CARBONATE PO [id number] Yes Take 1 tablet by mouth at bedtime. [provider] Taking Active Multiple Informants  metFORMIN  (GLUCOPHAGE ) 500 MG tablet [id number] Yes TAKE 1 TABLET BY MOUTH TWICE DAILY WITH MEALS Milian, Winda Hastings, FNP Taking Active   metoprolol  succinate (TOPROL -XL) 100 MG 24 hr tablet [id number] Yes TAKE 1 TABLET BY MOUTH DAILY WITH FOOD Gerard Knight, MD Taking Active   nitroGLYCERIN  (NITROSTAT ) 0.4 MG SL tablet [id number] Yes DISSOLVE 1 TAB UNDER TOUNGE FOR CHEST PAIN. MAY REPEAT EVERY 5 MINUTES FOR 3 DOSES. IF NO RELIEF CALL 911 OR GO TO ER Gerard Knight, MD Taking Active   rosuvastatin  (CRESTOR ) 20 MG tablet [id number] Yes TAKE ONE (1) TABLET BY MOUTH EVERY DAY Gerard Knight, MD Taking Active   SPIKEVAX syringe [id number]   [provider]  Active   timolol  (TIMOPTIC ) 0.5 % ophthalmic solution [id number] Yes Place 1 drop into both eyes daily. [provider] Taking Active Multiple Informants  traZODone  (DESYREL ) 150  MG tablet [id number] Yes TAKE ONE TABLET BY MOUTH AT BEDTIME Chrystine Crate, FNP Taking Active   TURMERIC PO [id number] Yes Take 1 tablet by mouth daily. [provider] Taking Active Multiple Informants  VENTOLIN  HFA 108 (90 Base) MCG/ACT inhaler  [id number] Yes Inhale 1-2 puffs into the lungs every 4 (four) hours as needed for shortness of breath. Colin Dawley, MD Taking Active Multiple Informants  Vitamin D , Cholecalciferol , 10 MCG (400 UNIT) CAPS [id number] Yes Take 400 Units by mouth daily. [provider] Taking Active Multiple Informants            Recommendation:   PCP Follow-up  Follow Up Plan:   Telephone follow up appointment date/time:  07-10-2023 at 9:00 am  Grandville Lax, BSN RN [facility], [facility] RN Care Manager Direct Dial: [phone]  Fax: [phone]

## 2023-06-12 NOTE — Patient Instructions (Signed)
 Visit Information  Thank you for taking time to visit with me today. Please don't hesitate to contact me if I can be of assistance to you before our next scheduled appointment.  Your next care management appointment is by telephone on 07-10-2023 at 9:00 am  Telephone follow-up in 1 month  Please call the care guide team at [phone] if you need to cancel, schedule, or reschedule an appointment.   Please call the Suicide and Crisis Lifeline: 988 call the [place]  National Suicide Prevention Lifeline: 480-367-8376 or TTY: 707-505-3034 TTY 984-200-7314) to talk to a trained counselor call 1-800-273-TALK (toll free, 24 hour hotline) call the [facility]: [phone] call 911 if you are experiencing a Mental Health or Behavioral Health Crisis or need someone to talk to.  Grandville Lax, BSN RN [facility], [facility] RN Care Manager Direct Dial: [phone]  Fax: [phone]

## 2023-06-25 ENCOUNTER — Ambulatory Visit (INDEPENDENT_AMBULATORY_CARE_PROVIDER_SITE_OTHER): Payer: Medicare Other

## 2023-06-25 DIAGNOSIS — I4891 Unspecified atrial fibrillation: Secondary | ICD-10-CM | POA: Diagnosis not present

## 2023-06-25 LAB — CUP PACEART REMOTE DEVICE CHECK
Date Time Interrogation Session: 20250504230742
Implantable Pulse Generator Implant Date: 20220210

## 2023-07-03 ENCOUNTER — Ambulatory Visit: Payer: Self-pay | Admitting: Cardiovascular Disease

## 2023-07-03 ENCOUNTER — Ambulatory Visit: Admitting: Family Medicine

## 2023-07-05 NOTE — Progress Notes (Signed)
 Carelink Summary Report / Loop Recorder

## 2023-07-05 NOTE — Addendum Note (Signed)
 Addended by: Edra Govern D on: 07/05/2023 02:44 PM   Modules accepted: Orders

## 2023-07-10 ENCOUNTER — Encounter: Payer: Self-pay | Admitting: *Deleted

## 2023-07-10 ENCOUNTER — Telehealth: Payer: Self-pay | Admitting: *Deleted

## 2023-07-10 ENCOUNTER — Other Ambulatory Visit: Payer: Self-pay | Admitting: Cardiology

## 2023-07-13 ENCOUNTER — Encounter: Payer: Self-pay | Admitting: Family Medicine

## 2023-07-13 ENCOUNTER — Ambulatory Visit (INDEPENDENT_AMBULATORY_CARE_PROVIDER_SITE_OTHER): Admitting: Family Medicine

## 2023-07-13 VITALS — BP 166/63 | HR 68 | Temp 98.0°F | Ht 64.0 in

## 2023-07-13 DIAGNOSIS — Z79899 Other long term (current) drug therapy: Secondary | ICD-10-CM | POA: Diagnosis not present

## 2023-07-13 DIAGNOSIS — M5440 Lumbago with sciatica, unspecified side: Secondary | ICD-10-CM

## 2023-07-13 DIAGNOSIS — M79605 Pain in left leg: Secondary | ICD-10-CM | POA: Diagnosis not present

## 2023-07-13 DIAGNOSIS — E1169 Type 2 diabetes mellitus with other specified complication: Secondary | ICD-10-CM

## 2023-07-13 DIAGNOSIS — M25551 Pain in right hip: Secondary | ICD-10-CM | POA: Diagnosis not present

## 2023-07-13 DIAGNOSIS — M79604 Pain in right leg: Secondary | ICD-10-CM

## 2023-07-13 DIAGNOSIS — E785 Hyperlipidemia, unspecified: Secondary | ICD-10-CM | POA: Diagnosis not present

## 2023-07-13 DIAGNOSIS — F5101 Primary insomnia: Secondary | ICD-10-CM

## 2023-07-13 DIAGNOSIS — F418 Other specified anxiety disorders: Secondary | ICD-10-CM

## 2023-07-13 DIAGNOSIS — F339 Major depressive disorder, recurrent, unspecified: Secondary | ICD-10-CM

## 2023-07-13 DIAGNOSIS — I509 Heart failure, unspecified: Secondary | ICD-10-CM | POA: Diagnosis not present

## 2023-07-13 DIAGNOSIS — M549 Dorsalgia, unspecified: Secondary | ICD-10-CM

## 2023-07-13 DIAGNOSIS — G8929 Other chronic pain: Secondary | ICD-10-CM

## 2023-07-13 LAB — BAYER DCA HB A1C WAIVED: HB A1C (BAYER DCA - WAIVED): 5.8 % — ABNORMAL HIGH (ref 4.8–5.6)

## 2023-07-13 MED ORDER — HYDROCODONE-ACETAMINOPHEN 7.5-325 MG PO TABS: 1.0000 | ORAL_TABLET | Freq: Four times a day (QID) | ORAL | 0 refills | Status: DC | PRN

## 2023-07-13 MED ORDER — LIDOCAINE 4 % EX PTCH: 2.0000 | MEDICATED_PATCH | CUTANEOUS | 0 refills | Status: AC

## 2023-07-13 NOTE — Progress Notes (Unsigned)
 Subjective:  Patient ID: [name], Julie    DOB: 1940-09-09, 83 y.o.   MRN: [id number]  Patient Care Team: Chrystine Crate, FNP as PCP - General (Family Medicine) Gerard Knight, MD as PCP - Cardiology (Cardiology) Mealor, Donnamae Gaba, MD as PCP - Electrophysiology (Cardiology) Reynold Caves, MD as Consulting Physician (Otolaryngology) Delilah Fend, [facility] (Pharmacist) Wendel Hals, NP as Nurse Practitioner (Endocrinology) [facility] Remona Carmel, RN as VBCI Care Management (General Practice) Remona Carmel, RN   Chief Complaint:  Medical Management of Chronic Issues   HPI: [name] is a 83 y.o. Julie presenting on 07/13/2023 for Medical Management of Chronic Issues  HPI 1. Type 2 diabetes mellitus with other specified complication, without long-term current use of insulin  (HCC) Does not currently have a monitor at home  Taking medication(s): metformin , tolerating well.  Last eye exam: due August  Last foot exam: Due in January 2026 Last A1c:  Lab Results  Component Value Date   HGBA1C 5.9 (H) 03/06/2023   Nephropathy screen indicated?: will recheck due to labs from insurance letter  Last flu, zoster and/or pneumovax:  Immunization History  Administered Date(s) Administered   Fluad Quad(high Dose 65+) 11/20/2019, 12/03/2020, 12/16/2021   Fluad Trivalent(High Dose 65+) 11/30/2022   Influenza Split 12/05/2005, 12/03/2006   Influenza, High Dose Seasonal PF 12/25/2016, 01/07/2018   Influenza,inj,quad, With Preservative 12/03/2015   Influenza-Unspecified 01/30/1995, 12/13/2000, 03/16/2003, 01/21/2015, 12/25/2016, 01/07/2018, 11/21/2018   Moderna Covid-19 Fall Seasonal Vaccine 78yrs & older 11/17/2021, 01/10/2023   Moderna Covid-19 Vaccine Bivalent Booster 64yrs & up 01/18/2021   Moderna SARS-COV2 Booster Vaccination 12/31/2019, 07/04/2020   Moderna Sars-Covid-2 Vaccination 04/01/2019, 04/29/2019   PNEUMOCOCCAL  CONJUGATE-20 11/30/2022   Pneumococcal Polysaccharide-23 01/30/1995, 12/05/2005, 03/11/2020   Pneumococcal-Unspecified 12/24/2018   Respiratory Syncytial Virus Vaccine,Recomb Aduvanted(Arexvy) 11/17/2021   Tdap 12/24/2018   Zoster Recombinant(Shingrix ) 03/04/2021, 06/24/2021    ROS: Denies dizziness, LOC, polyuria, polydipsia, unintended weight loss/gain, foot ulcerations, numbness or tingling in extremities, shortness of breath or chest pain.  2. Hyperlipidemia associated with type 2 diabetes mellitus (HCC) Compliant with crestor . Denies side effects.   3. Anxiety with depression/4. Depression, recurrent (HCC) States that things are better. States that son and grandson are still living with her. Denies SI.   5. Pain of back and left lower extremity States that norco is doing okay. She is taking more than she normallydoes but it is helping her. She has more pain on the right side of her right leg. She has not tried any heat or ice. She   6. Primary insomnia She is takign trazodone . States that sometimes it works and sometimes it does not.   7. Acute congestive heart failure, unspecified heart failure type [facility]) Established with cardiology and EP.    Relevant past medical, surgical, family, and social history reviewed and updated as indicated.  Allergies and medications reviewed and updated. Data reviewed: Chart in Epic.   Past Medical History:  Diagnosis Date   Anxiety    Arthritis    Bulging lumbar disc    Chronic lower back pain    Coronary atherosclerosis of native coronary artery    DES LAD 02/2008, DES LAD 02/2015, DES ramus and DES x2 mid RCA 02/2020   Depression    Dyslipidemia    Essential hypertension    Facial numbness    Frequent headaches    GERD (gastroesophageal reflux disease)    Glaucoma    Heart  attack (HCC) 02/2020   Insomnia    Paroxysmal atrial fibrillation [facility])    Diagnosed October 2019   PSVT (paroxysmal supraventricular tachycardia) (HCC)     Refusal of blood transfusions as patient is Jehovah's Witness    Thyroid  disease    Type 2 diabetes mellitus (HCC)    Borderline   Vitamin D  deficiency     Past Surgical History:  Procedure Laterality Date   ATRIAL FIBRILLATION ABLATION N/A 02/16/2021   Procedure: ATRIAL FIBRILLATION ABLATION;  Surgeon: Jolly Needle, MD;  [facility]: [facility] INVASIVE CV LAB;  Service: Cardiovascular;  Laterality: N/A;   BIOPSY  09/21/2021   Procedure: BIOPSY;  Surgeon: Suzette Espy, MD;  Location: [facility] ENDO SUITE;  Service: Endoscopy;;  gastric   CARDIAC CATHETERIZATION N/A 02/23/2015   Procedure: Left Heart Cath and Coronary Angiography;  Surgeon: Arnoldo Lapping, MD;  Location: [facility] INVASIVE CV LAB;  Service: Cardiovascular;  Laterality: N/A;   CARDIAC CATHETERIZATION N/A 02/23/2015   Procedure: Coronary Stent Intervention;  Surgeon: Arnoldo Lapping, MD;  Location: [facility] INVASIVE CV LAB;  Service: Cardiovascular;  Laterality: N/A;   CARDIAC CATHETERIZATION N/A 01/04/2016   Procedure: Left Heart Cath and Coronary Angiography;  Surgeon: Peter M Swaziland, MD;  Location: [facility] INVASIVE CV LAB;  Service: Cardiovascular;  Laterality: N/A;   CATARACT EXTRACTION     CORONARY ANGIOPLASTY  02/23/2015   CORONARY ANGIOPLASTY WITH STENT PLACEMENT  2009   CORONARY STENT INTERVENTION N/A 03/09/2020   Procedure: CORONARY STENT INTERVENTION;  Surgeon: Lucendia Rusk, MD;  Location: [facility] INVASIVE CV LAB;  Service: Cardiovascular;  Laterality: N/A;   DILATION AND CURETTAGE OF UTERUS     ESOPHAGOGASTRODUODENOSCOPY (EGD) WITH PROPOFOL  N/A 09/21/2021   normal esophagus, abnormal gastric mucosa s/p biopsy, normal duodenum. Pathology with H.pylori. Prescribed Prevpac.   implantable loop recorder placement  04/01/2020   Medtronic Reveal [place] model LNQ 22 [id number]) implantable loop recorder    LEFT HEART CATH AND CORONARY ANGIOGRAPHY N/A 03/09/2020   Procedure: LEFT HEART CATH AND CORONARY ANGIOGRAPHY;  Surgeon: Lucendia Rusk,  MD;  Location: [facility] INVASIVE CV LAB;  Service: Cardiovascular;  Laterality: N/A;   TUBAL LIGATION      Social History   Socioeconomic History   Marital status: Widowed    Spouse name: Not on file   Number of children: 8   Years of education: 10th grade   Highest education level: 10th grade  Occupational History   Occupation: RETIRED  Tobacco Use   Smoking status: Never   Smokeless tobacco: Never   Tobacco comments:    Never smoke 11/15/21  Vaping Use   Vaping status: Never Used  Substance and Sexual Activity   Alcohol use: Not Currently    Alcohol/week: 1.0 standard drink of alcohol    Types: 1 Glasses of wine per week    Comment: RARE OCCASION   Drug use: No   Sexual activity: Not Currently  Other Topics Concern   Not on file  Social History Narrative   Lives at home alone.   Right-handed.   No caffeine use.   Daughter lives next door and helps her out   Social Drivers of Health   Financial Resource Strain: Low Risk  (03/21/2023)   [facility] from [facility]   Overall Financial Resource Strain (CARDIA)    Difficulty of Paying Living Expenses: Not very hard  Food Insecurity: No Food Insecurity (06/12/2023)   Hunger Vital Sign    Worried About Running Out of Food in the Last Year:  Never true    Ran Out of Food in the Last Year: Never true  Transportation Needs: No Transportation Needs (06/12/2023)   PRAPARE - Administrator, Civil Service (Medical): No    Lack of Transportation (Non-Medical): No  Physical Activity: Inactive (03/05/2023)   Exercise Vital Sign    Days of Exercise per Week: 0 days    Minutes of Exercise per Session: 0 min  Stress: No Stress Concern Present (03/05/2023)   [employer] of Occupational Health - Occupational Stress Questionnaire    Feeling of Stress : Not at all  Social Connections: Moderately Isolated (03/05/2023)   Social Connection and Isolation Panel [NHANES]    Frequency of Communication with Friends and Family: More  than three times a week    Frequency of Social Gatherings with Friends and Family: Three times a week    Attends Religious Services: More than 4 times per year    Active Member of Clubs or Organizations: No    Attends Banker Meetings: Never    Marital Status: Widowed  Intimate Partner Violence: Not At Risk (06/12/2023)   Humiliation, Afraid, Rape, and Kick questionnaire    Fear of Current or Ex-Partner: No    Emotionally Abused: No    Physically Abused: No    Sexually Abused: No   Outpatient Encounter Medications as of 07/13/2023  Medication Sig   acetaminophen  (TYLENOL ) 500 MG tablet Take 1,000 mg by mouth every 6 (six) hours as needed for moderate pain.   Ascorbic Acid  (VITAMIN C ) 500 MG tablet Take 500 mg by mouth daily.     bimatoprost (LUMIGAN) 0.01 % SOLN Place 1 drop into both eyes at bedtime.   Calcium  Carbonate Antacid (TUMS PO) Take 2 tablets by mouth 4 (four) times daily as needed (for acid reflux/indigestion).    Camphor-Menthol -Methyl Sal (SALONPAS ) 3.02-26-08 % PTCH Apply 1 patch topically daily as needed (pain).   cetirizine (ZYRTEC) 10 MG tablet Take 10 mg by mouth daily as needed for allergies.   Cyanocobalamin  (VITAMIN B 12 PO) Take 1 tablet by mouth daily.   diclofenac  Sodium (VOLTAREN ) 1 % GEL Apply 2 g topically 4 (four) times daily. (Patient taking differently: Apply 2 g topically 4 (four) times daily as needed (pain).)   diltiazem  (CARDIZEM  CD) 300 MG 24 hr capsule TAKE ONE CAPSULE BY MOUTH DAILY   DULoxetine  (CYMBALTA ) 20 MG capsule TAKE ONE CAPSULE BY MOUTH DAILY   DULoxetine  (CYMBALTA ) 30 MG capsule Take 30 mg by mouth daily.   ELIQUIS  5 MG TABS tablet TAKE ONE TABLET BY MOUTH TWICE DAILY   GARLIC  PO Take 1 tablet by mouth daily.   levothyroxine  (SYNTHROID ) 25 MCG tablet Take 1 tablet (25 mcg total) by mouth daily before breakfast.   lidocaine  (HM LIDOCAINE  PATCH) 4 % Place 2 patches onto the skin daily.   losartan  (COZAAR ) 50 MG tablet TAKE ONE (1)  TABLET BY MOUTH EVERY DAY   MAGNESIUM  CARBONATE PO Take 1 tablet by mouth at bedtime.   metFORMIN  (GLUCOPHAGE ) 500 MG tablet TAKE 1 TABLET BY MOUTH TWICE DAILY WITH MEALS   metoprolol  succinate (TOPROL -XL) 100 MG 24 hr tablet TAKE 1 TABLET BY MOUTH DAILY WITH FOOD   nitroGLYCERIN  (NITROSTAT ) 0.4 MG SL tablet DISSOLVE 1 TAB UNDER TOUNGE FOR CHEST PAIN. MAY REPEAT EVERY 5 MINUTES FOR 3 DOSES. IF NO RELIEF CALL 911 OR GO TO ER   rosuvastatin  (CRESTOR ) 20 MG tablet TAKE ONE (1) TABLET BY MOUTH EVERY DAY  SPIKEVAX syringe    timolol  (TIMOPTIC ) 0.5 % ophthalmic solution Place 1 drop into both eyes daily.   traZODone  (DESYREL ) 150 MG tablet TAKE ONE TABLET BY MOUTH AT BEDTIME   TURMERIC PO Take 1 tablet by mouth daily.   VENTOLIN  HFA 108 (90 Base) MCG/ACT inhaler Inhale 1-2 puffs into the lungs every 4 (four) hours as needed for shortness of breath.   Vitamin D , Cholecalciferol , 10 MCG (400 UNIT) CAPS Take 400 Units by mouth daily.   No facility-administered encounter medications on file as of 07/13/2023.   No Known Allergies  Review of Systems As per HPI  Objective:  BP (!) 166/63   Pulse 68   Temp 98 F (36.7 C)   Ht 5\' 4"  (1.626 m)   LMP  (LMP Unknown)   SpO2 95%   BMI 24.89 kg/m    Wt Readings from Last 3 Encounters:  06/05/23 145 lb (65.8 kg)  05/01/23 147 lb (66.7 kg)  04/10/23 148 lb 9.6 oz (67.4 kg)   Physical Exam Constitutional:      General: She is awake. She is not in acute distress.    Appearance: Normal appearance. She is well-developed and well-groomed. She is not ill-appearing, toxic-appearing or diaphoretic.  Cardiovascular:     Rate and Rhythm: Normal rate. Rhythm irregular.     Pulses: Normal pulses.          Radial pulses are 2+ on the right side and 2+ on the left side.       Posterior tibial pulses are 2+ on the right side and 2+ on the left side.     Heart sounds: Normal heart sounds. No murmur heard.    No gallop.  Pulmonary:     Effort: Pulmonary  effort is normal. No respiratory distress.     Breath sounds: Normal breath sounds. No stridor. No wheezing, rhonchi or rales.  Musculoskeletal:     Cervical back: Full passive range of motion without pain and neck supple.     Right lower leg: No edema.     Left lower leg: No edema.  Skin:    General: Skin is warm.     Capillary Refill: Capillary refill takes less than 2 seconds.  Neurological:     General: No focal deficit present.     Mental Status: She is alert, oriented to person, place, and time and easily aroused. Mental status is at baseline.     GCS: GCS eye subscore is 4. GCS verbal subscore is 5. GCS motor subscore is 6.     Motor: No weakness.  Psychiatric:        Attention and Perception: Attention and perception normal.        Mood and Affect: Mood and affect normal.        Speech: Speech normal.        Behavior: Behavior normal. Behavior is cooperative.        Thought Content: Thought content normal. Thought content does not include homicidal or suicidal ideation. Thought content does not include homicidal or suicidal plan.        Cognition and Memory: Cognition and memory normal.        Judgment: Judgment normal.      Results for orders placed or performed in visit on 06/25/23  CUP PACEART REMOTE DEVICE CHECK   Collection Time: 06/24/23 11:07 PM  Result Value Ref Range   Date Time Interrogation Session [id number]    Pulse Generator Manufacturer MERM  Pulse Gen Model [id number] II    Pulse Gen Serial Number [id number]    Clinic Name [facility]    Implantable Pulse Generator Type ICM/ILR    Implantable Pulse Generator Implant Date [id number]        07/13/2023   10:44 AM 06/12/2023   12:16 PM 06/05/2023   10:20 AM 05/01/2023    4:24 PM 03/06/2023   10:30 AM  Depression screen PHQ 2/9  Decreased Interest 1 1 1 1 1   Down, Depressed, Hopeless 1 0 0 1 1  PHQ - 2 Score 2 1 1 2 2   Altered sleeping 1  1 2 2   Tired, decreased energy 2  2 2 2   Change in  appetite 2  2 0 3  Feeling bad or failure about yourself  0  1 0 1  Trouble concentrating 1  1 1 2   Moving slowly or fidgety/restless 0  0 0 0  Suicidal thoughts 0  0 0 0  PHQ-9 Score 8  8 7 12   Difficult doing work/chores Not difficult at all    Not difficult at all       07/13/2023   10:44 AM 06/05/2023   10:20 AM 05/01/2023    4:26 PM 03/06/2023   10:31 AM  GAD 7 : Generalized Anxiety Score  Nervous, Anxious, on Edge 1 1 1 1   Control/stop worrying 1 1 1 2   Worry too much - different things 1 1 1 2   Trouble relaxing 1 0 1 1  Restless 0 0 0 1  Easily annoyed or irritable 1 0 1 1  Afraid - awful might happen 1 0 0 1  Total GAD 7 Score 6 3 5 9   Anxiety Difficulty Not difficult at all Somewhat difficult Somewhat difficult Somewhat difficult   Pertinent labs & imaging results that were available during my care of the patient were reviewed by me and considered in my medical decision making.  Assessment & Plan:  [name] was seen today for medical management of chronic issues.  Diagnoses and all orders for this visit: 1. Type 2 diabetes mellitus with other specified complication, without long-term current use of insulin  (HCC) (Primary) Well controlled. Continue current regimen. Labs as below. Will communicate results to patient once available. Will await results to determine next steps.  - Bayer DCA Hb A1c Waived - Microalbumin / creatinine urine ratio  2. Hyperlipidemia associated with type 2 diabetes mellitus (HCC) Continue current regimen. Follow up with cardiology   3. Depression, recurrent (HCC) Stable. Denies SI. Continue current regimen.   4. Anxiety with depression As above.   5. Primary insomnia Stable. Continue current regimen.   6. Acute congestive heart failure, unspecified heart failure type [facility]) Established with Cardiology. Follow up with speciality.   7. Right leg pain Will start medications as below to assist with breakthrough pain.  - lidocaine  (HM  LIDOCAINE  PATCH) 4 %; Place 2 patches onto the skin daily.  Dispense: 10 patch; Refill: 0  8. Pain of back and left lower extremity Stable. Continue current regimen. Refills provided. Controlled substance agreement  signs. PDMP reviewed, no red flags.  - HYDROcodone -acetaminophen  (NORCO) 7.5-325 MG tablet; Take 1 tablet by mouth every 6 (six) hours as needed for moderate pain (pain score 4-6).  Dispense: 30 tablet; Refill: 0 - HYDROcodone -acetaminophen  (NORCO) 7.5-325 MG tablet; Take 1 tablet by mouth every 6 (six) hours as needed for moderate pain (pain score 4-6).  Dispense: 30 tablet; Refill: 0 - HYDROcodone -acetaminophen  (NORCO) 7.5-325  MG tablet; Take 1 tablet by mouth every 6 (six) hours as needed for moderate pain (pain score 4-6).  Dispense: 30 tablet; Refill: 0  9. Chronic midline low back pain with sciatica, sciatica laterality unspecified As above.  - HYDROcodone -acetaminophen  (NORCO) 7.5-325 MG tablet; Take 1 tablet by mouth every 6 (six) hours as needed for moderate pain (pain score 4-6).  Dispense: 30 tablet; Refill: 0 - HYDROcodone -acetaminophen  (NORCO) 7.5-325 MG tablet; Take 1 tablet by mouth every 6 (six) hours as needed for moderate pain (pain score 4-6).  Dispense: 30 tablet; Refill: 0 - HYDROcodone -acetaminophen  (NORCO) 7.5-325 MG tablet; Take 1 tablet by mouth every 6 (six) hours as needed for moderate pain (pain score 4-6).  Dispense: 30 tablet; Refill: 0  10. Chronic hip pain, bilateral As above.  - HYDROcodone -acetaminophen  (NORCO) 7.5-325 MG tablet; Take 1 tablet by mouth every 6 (six) hours as needed for moderate pain (pain score 4-6).  Dispense: 30 tablet; Refill: 0 - HYDROcodone -acetaminophen  (NORCO) 7.5-325 MG tablet; Take 1 tablet by mouth every 6 (six) hours as needed for moderate pain (pain score 4-6).  Dispense: 30 tablet; Refill: 0 - HYDROcodone -acetaminophen  (NORCO) 7.5-325 MG tablet; Take 1 tablet by mouth every 6 (six) hours as needed for moderate pain (pain  score 4-6).  Dispense: 30 tablet; Refill: 0  11. Controlled substance agreement signed As above  - HYDROcodone -acetaminophen  (NORCO) 7.5-325 MG tablet; Take 1 tablet by mouth every 6 (six) hours as needed for moderate pain (pain score 4-6).  Dispense: 30 tablet; Refill: 0 - HYDROcodone -acetaminophen  (NORCO) 7.5-325 MG tablet; Take 1 tablet by mouth every 6 (six) hours as needed for moderate pain (pain score 4-6).  Dispense: 30 tablet; Refill: 0 - HYDROcodone -acetaminophen  (NORCO) 7.5-325 MG tablet; Take 1 tablet by mouth every 6 (six) hours as needed for moderate pain (pain score 4-6).  Dispense: 30 tablet; Refill: 0  Continue all other maintenance medications.  Follow up plan: Return in about 3 months (around 10/13/2023) for Chronic Condition Follow up.  Continue healthy lifestyle choices, including diet (rich in fruits, vegetables, and lean proteins, and low in salt and simple carbohydrates) and exercise (at least 30 minutes of moderate physical activity daily).  Written and verbal instructions provided   The above assessment and management plan was discussed with the patient. The patient verbalized understanding of and has agreed to the management plan. Patient is aware to call the clinic if they develop any new symptoms or if symptoms persist or worsen. Patient is aware when to return to the clinic for a follow-up visit. Patient educated on when it is appropriate to go to the emergency department.   Jacqualyn Mates, DNP-FNP [facility] [address] [place], [place] [address] [phone]

## 2023-07-14 LAB — MICROALBUMIN / CREATININE URINE RATIO
Creatinine, Urine: 50.1 mg/dL
Microalb/Creat Ratio: 26 mg/g{creat} (ref 0–29)
Microalbumin, Urine: 13.1 ug/mL

## 2023-07-17 ENCOUNTER — Ambulatory Visit: Payer: Self-pay | Admitting: Family Medicine

## 2023-07-17 NOTE — Progress Notes (Signed)
 A1C is well controlled. While microalbumin/creatinine ratio has increased, it remains in normal range. I recommend repeating in 6 months

## 2023-07-19 ENCOUNTER — Other Ambulatory Visit: Payer: Self-pay | Admitting: Family Medicine

## 2023-07-19 DIAGNOSIS — E1159 Type 2 diabetes mellitus with other circulatory complications: Secondary | ICD-10-CM

## 2023-07-19 DIAGNOSIS — F5101 Primary insomnia: Secondary | ICD-10-CM

## 2023-07-19 MED ORDER — METFORMIN HCL 500 MG PO TABS: 500.0000 mg | ORAL_TABLET | Freq: Two times a day (BID) | ORAL | 0 refills | Status: DC

## 2023-07-19 MED ORDER — TRAZODONE HCL 150 MG PO TABS: 150.0000 mg | ORAL_TABLET | Freq: Every day | ORAL | 0 refills | Status: DC

## 2023-07-19 NOTE — Telephone Encounter (Unsigned)
 Copied from CRM [id number]. Topic: Clinical - Medication Refill >> Jul 19, 2023 11:25 AM Loreda Rodriguez T wrote: Medication: traZODone  (DESYREL ) 150 MG tablet and metFORMIN  (GLUCOPHAGE ) 500 MG tablet  Has the patient contacted their pharmacy? No  This is the patient's preferred pharmacy:  [facility] [place] - [address] [place] [address] Phone: [phone] Fax: [phone]  Is this the correct pharmacy for this prescription? Yes  Has the prescription been filled recently? Yes  Is the patient out of the medication? Yes  Has the patient been seen for an appointment in the last year OR does the patient have an upcoming appointment? Yes  Can we respond through MyChart? Yes  Agent: Please be advised that Rx refills may take up to 3 business days. We ask that you follow-up with your pharmacy.

## 2023-07-20 ENCOUNTER — Telehealth: Payer: Self-pay | Admitting: Family Medicine

## 2023-07-20 NOTE — Telephone Encounter (Signed)
 Called patient to get at home BP readings, patient does not have any readings to provide. Patient stated that she was not sure of a day and time that would work to come in for a BP check because she has to depend on her daughter to bring her and her daughter is very busy. I told patient that if it would be easier to just walk into the office one day and ask for a BP check that would be fine. Patient stated that is what she would do.

## 2023-07-23 ENCOUNTER — Telehealth: Payer: Self-pay

## 2023-07-23 NOTE — Progress Notes (Unsigned)
 Complex Care Management Care Guide Note  07/23/2023 Name: [name] MRN: [id number] DOB: 1940/10/07  [name] is a 83 y.o. year old female who is a primary care patient of Rosalynn Come, Winda Hastings, FNP and is actively engaged with the care management team. I reached out to [name] by phone today to assist with re-scheduling  with the RN Case Manager.  Follow up plan: Unsuccessful telephone outreach attempt made. A HIPAA compliant phone message was left for the patient providing contact information and requesting a return call.  Lenton Rail , RMA     [facility]  [facility], [facility] Guide  Direct Dial: [phone]  Website: [email or web address]

## 2023-07-23 NOTE — Telephone Encounter (Signed)
 Copied from CRM [id number]. Topic: Clinical - Medical Advice >> Jul 23, 2023  4:44 PM Tiffany H wrote: Reason for CRM:  Patient called to advise that she was able to successfully get her blood machine working. Patient took 3 readings after 12PM, readings as follows:   157/69 65 2PM 147/68 62 3PM 141/65/60 4PM

## 2023-07-23 NOTE — Telephone Encounter (Signed)
 Will have patient come into Triage for BP check.

## 2023-07-24 NOTE — Telephone Encounter (Signed)
 Patient notified and expressed understanding.

## 2023-07-26 ENCOUNTER — Ambulatory Visit (INDEPENDENT_AMBULATORY_CARE_PROVIDER_SITE_OTHER)

## 2023-07-26 DIAGNOSIS — I4891 Unspecified atrial fibrillation: Secondary | ICD-10-CM

## 2023-07-26 LAB — CUP PACEART REMOTE DEVICE CHECK
Date Time Interrogation Session: 20250604231125
Implantable Pulse Generator Implant Date: 20220210

## 2023-07-26 NOTE — Progress Notes (Signed)
 Complex Care Management Care Guide Note  07/26/2023 Name: [name] MRN: [id number] DOB: Jun 17, 1940  [name] is a 83 y.o. year old female who is a primary care patient of Rosalynn Come, Winda Hastings, FNP and is actively engaged with the care management team. I reached out to [name] by phone today to assist with re-scheduling  with the RN Case Manager.  Follow up plan: Telephone appointment with complex care management team member scheduled for:  08/21/2023  Lenton Rail , RMA     [place]  [facility], [facility] Guide  Direct Dial: [phone]  Website: [email or web address]

## 2023-07-27 ENCOUNTER — Ambulatory Visit: Payer: Self-pay | Admitting: Cardiovascular Disease

## 2023-08-02 ENCOUNTER — Other Ambulatory Visit: Payer: Self-pay | Admitting: Cardiology

## 2023-08-07 ENCOUNTER — Other Ambulatory Visit (HOSPITAL_COMMUNITY)
Admission: RE | Admit: 2023-08-07 | Discharge: 2023-08-07 | Disposition: A | Discharge status: Home / Self Care | Source: Ambulatory Visit | Attending: Nurse Practitioner | Admitting: Nurse Practitioner

## 2023-08-07 DIAGNOSIS — E032 Hypothyroidism due to medicaments and other exogenous substances: Secondary | ICD-10-CM | POA: Diagnosis not present

## 2023-08-07 LAB — TSH: TSH: 0.483 u[IU]/mL (ref 0.350–4.500)

## 2023-08-07 LAB — T4, FREE: Free T4: 1 ng/dL (ref 0.61–1.12)

## 2023-08-08 ENCOUNTER — Telehealth: Payer: Self-pay

## 2023-08-08 NOTE — Telephone Encounter (Signed)
 Copied from CRM [id number]. Topic: Clinical - Medical Advice >> Aug 08, 2023  9:59 AM Ivette P wrote: Reason for CRM: Pt called in because she was told by the nurse of primary Connell Degree to take track of her blood pressure readings.   Blood pressure reading mornings & evenings 06/02-06/06 monday 177/74/73 167/81/72  tuesday  164/80/63 146/67/62  wednesday 131/66/67 134/67/76  thrusday 123/52/57 151/67/65  Friday  156/69/67 140/69/61  Blood pressure readings  Morning and evening 06/09-06/13 Monday 174/72/73 149/71/72  Tuesday  149/67/71 119/67/74  Wednesday  119/61/73 118/61/64  Thursday  109/53/64 133/69/75  Friday  131/64/68 135/75/73  Pt would like to know if she continues to take the readings. Pls follow up with pt [phone]

## 2023-08-09 ENCOUNTER — Encounter: Payer: Self-pay | Admitting: Nurse Practitioner

## 2023-08-09 ENCOUNTER — Ambulatory Visit (INDEPENDENT_AMBULATORY_CARE_PROVIDER_SITE_OTHER): Payer: 59 | Admitting: Nurse Practitioner

## 2023-08-09 VITALS — BP 132/68 | HR 68 | Ht 64.0 in | Wt 145.6 lb

## 2023-08-09 DIAGNOSIS — E032 Hypothyroidism due to medicaments and other exogenous substances: Secondary | ICD-10-CM

## 2023-08-09 DIAGNOSIS — E042 Nontoxic multinodular goiter: Secondary | ICD-10-CM | POA: Diagnosis not present

## 2023-08-09 NOTE — Progress Notes (Signed)
 08/09/2023, 3:08 PM                          Endocrinology Follow Up Visit   Subjective:    Patient ID: [name], female    DOB: [birth year], PCP Chrystine Crate, FNP   Past Medical History:  Diagnosis Date   Anxiety    Arthritis    Bulging lumbar disc    Chronic lower back pain    Coronary atherosclerosis of native coronary artery    DES LAD 02/2008, DES LAD 02/2015, DES ramus and DES x2 mid RCA 02/2020   Depression    Dyslipidemia    Essential hypertension    Facial numbness    Frequent headaches    GERD (gastroesophageal reflux disease)    Glaucoma    Heart attack (HCC) 02/2020   Insomnia    Paroxysmal atrial fibrillation (HCC)    Diagnosed October 2019   PSVT (paroxysmal supraventricular tachycardia) (HCC)    Refusal of blood transfusions as patient is Jehovah's Witness    Thyroid  disease    Type 2 diabetes mellitus (HCC)    Borderline   Vitamin D  deficiency    Past Surgical History:  Procedure Laterality Date   ATRIAL FIBRILLATION ABLATION N/A 02/16/2021   Procedure: ATRIAL FIBRILLATION ABLATION;  Surgeon: Jolly Needle, MD;  Location: [facility] INVASIVE CV LAB;  Service: Cardiovascular;  Laterality: N/A;   BIOPSY  09/21/2021   Procedure: BIOPSY;  Surgeon: Suzette Espy, MD;  Location: [facility];  Service: Endoscopy;;  gastric   CARDIAC CATHETERIZATION N/A 02/23/2015   Procedure: Left Heart Cath and Coronary Angiography;  Surgeon: Arnoldo Lapping, MD;  Location: [facility] INVASIVE CV LAB;  Service: Cardiovascular;  Laterality: N/A;   CARDIAC CATHETERIZATION N/A 02/23/2015   Procedure: Coronary Stent Intervention;  Surgeon: Arnoldo Lapping, MD;  Location: [facility] INVASIVE CV LAB;  Service: Cardiovascular;  Laterality: N/A;   CARDIAC CATHETERIZATION N/A 01/04/2016   Procedure: Left Heart Cath and Coronary Angiography;  Surgeon: Peter M Swaziland, MD;  Location: [facility] INVASIVE CV LAB;  Service: Cardiovascular;  Laterality: N/A;    CATARACT EXTRACTION     CORONARY ANGIOPLASTY  02/23/2015   CORONARY ANGIOPLASTY WITH STENT PLACEMENT  2009   CORONARY STENT INTERVENTION N/A 03/09/2020   Procedure: CORONARY STENT INTERVENTION;  Surgeon: Lucendia Rusk, MD;  Location: [facility] INVASIVE CV LAB;  Service: Cardiovascular;  Laterality: N/A;   DILATION AND CURETTAGE OF UTERUS     ESOPHAGOGASTRODUODENOSCOPY (EGD) WITH PROPOFOL  N/A 09/21/2021   normal esophagus, abnormal gastric mucosa s/p biopsy, normal duodenum. Pathology with H.pylori. Prescribed Prevpac.   implantable loop recorder placement  04/01/2020   Medtronic Reveal [place] model LNQ [id number]) implantable loop recorder    LEFT HEART CATH AND CORONARY ANGIOGRAPHY N/A 03/09/2020   Procedure: LEFT HEART CATH AND CORONARY ANGIOGRAPHY;  Surgeon: Lucendia Rusk, MD;  Location: [facility] INVASIVE CV LAB;  Service: Cardiovascular;  Laterality: N/A;   TUBAL LIGATION     Social History   Socioeconomic History   Marital status: Widowed    Spouse name: Not on file   Number of children: 8   Years of education: 10th grade   Highest education level: 10th grade  Occupational History   Occupation: RETIRED  Tobacco Use   Smoking status: Never   Smokeless tobacco: Never   Tobacco comments:    Never smoke 11/15/21  Vaping Use   Vaping status: Never Used  Substance and Sexual Activity   Alcohol use: Not Currently    Alcohol/week: 1.0 standard drink of alcohol    Types: 1 Glasses of wine per week    Comment: RARE OCCASION   Drug use: No   Sexual activity: Not Currently  Other Topics Concern   Not on file  Social History Narrative   Lives at home alone.   Right-handed.   No caffeine use.   Daughter lives next door and helps her out   Social Drivers of Health   Financial Resource Strain: Low Risk  (03/21/2023)   Received from [facility]   Overall Financial Resource Strain (CARDIA)    Difficulty of Paying Living Expenses: Not very hard  Food Insecurity: No Food  Insecurity (06/12/2023)   Hunger Vital Sign    Worried About Running Out of Food in the Last Year: Never true    Ran Out of Food in the Last Year: Never true  Transportation Needs: No Transportation Needs (06/12/2023)   PRAPARE - Administrator, Civil Service (Medical): No    Lack of Transportation (Non-Medical): No  Physical Activity: Inactive (03/05/2023)   Exercise Vital Sign    Days of Exercise per Week: 0 days    Minutes of Exercise per Session: 0 min  Stress: No Stress Concern Present (03/05/2023)   [employer] of Occupational Health - Occupational Stress Questionnaire    Feeling of Stress : Not at all  Social Connections: Moderately Isolated (03/05/2023)   Social Connection and Isolation Panel    Frequency of Communication with Friends and Family: More than three times a week    Frequency of Social Gatherings with Friends and Family: Three times a week    Attends Religious Services: More than 4 times per year    Active Member of Clubs or Organizations: No    Attends Banker Meetings: Never    Marital Status: Widowed   Family History  Problem Relation Age of Onset   Other Mother        natural causes   Heart disease Mother    Cancer Father        unsure of type   Colon cancer Daughter        2011   Outpatient Encounter Medications as of 08/09/2023  Medication Sig   acetaminophen  (TYLENOL ) 500 MG tablet Take 1,000 mg by mouth every 6 (six) hours as needed for moderate pain.   Ascorbic Acid  (VITAMIN C ) 500 MG tablet Take 500 mg by mouth daily.     bimatoprost (LUMIGAN) 0.01 % SOLN Place 1 drop into both eyes at bedtime.   Calcium  Carbonate Antacid (TUMS PO) Take 2 tablets by mouth 4 (four) times daily as needed (for acid reflux/indigestion).    Camphor-Menthol -Methyl Sal (SALONPAS ) 3.02-26-08 % PTCH Apply 1 patch topically daily as needed (pain).   cetirizine (ZYRTEC) 10 MG tablet Take 10 mg by mouth daily as needed for allergies.   Cyanocobalamin   (VITAMIN B 12 PO) Take 1 tablet by mouth daily.   diclofenac  Sodium (VOLTAREN ) 1 % GEL Apply 2 g topically 4 (four) times daily.   diltiazem  (CARDIZEM  CD) 300 MG 24 hr capsule TAKE ONE CAPSULE BY MOUTH DAILY   DULoxetine  (CYMBALTA ) 30 MG capsule Take 30 mg  by mouth daily.   ELIQUIS  5 MG TABS tablet TAKE ONE TABLET BY MOUTH TWICE DAILY   GARLIC  PO Take 1 tablet by mouth daily.   HYDROcodone -acetaminophen  (NORCO) 7.5-325 MG tablet Take 1 tablet by mouth every 6 (six) hours as needed for moderate pain (pain score 4-6).   [START ON 08/12/2023] HYDROcodone -acetaminophen  (NORCO) 7.5-325 MG tablet Take 1 tablet by mouth every 6 (six) hours as needed for moderate pain (pain score 4-6).   [START ON 09/11/2023] HYDROcodone -acetaminophen  (NORCO) 7.5-325 MG tablet Take 1 tablet by mouth every 6 (six) hours as needed for moderate pain (pain score 4-6).   levothyroxine  (SYNTHROID ) 25 MCG tablet Take 1 tablet (25 mcg total) by mouth daily before breakfast.   lidocaine  (HM LIDOCAINE  PATCH) 4 % Place 2 patches onto the skin daily.   losartan  (COZAAR ) 50 MG tablet TAKE ONE (1) TABLET BY MOUTH EVERY DAY   MAGNESIUM  CARBONATE PO Take 1 tablet by mouth at bedtime.   metFORMIN  (GLUCOPHAGE ) 500 MG tablet Take 1 tablet (500 mg total) by mouth 2 (two) times daily with a meal.   metoprolol  succinate (TOPROL -XL) 100 MG 24 hr tablet TAKE 1 TABLET BY MOUTH DAILY WITH FOOD   nitroGLYCERIN  (NITROSTAT ) 0.4 MG SL tablet DISSOLVE 1 TAB UNDER TOUNGE FOR CHEST PAIN. MAY REPEAT EVERY 5 MINUTES FOR 3 DOSES. IF NO RELIEF CALL 911 OR GO TO ER   rosuvastatin  (CRESTOR ) 20 MG tablet TAKE ONE (1) TABLET BY MOUTH EVERY DAY   SPIKEVAX syringe    timolol  (TIMOPTIC ) 0.5 % ophthalmic solution Place 1 drop into both eyes daily.   traZODone  (DESYREL ) 150 MG tablet Take 1 tablet (150 mg total) by mouth at bedtime.   TURMERIC PO Take 1 tablet by mouth daily.   VENTOLIN  HFA 108 (90 Base) MCG/ACT inhaler Inhale 1-2 puffs into the lungs every 4 (four)  hours as needed for shortness of breath.   Vitamin D , Cholecalciferol , 10 MCG (400 UNIT) CAPS Take 400 Units by mouth daily.   No facility-administered encounter medications on file as of 08/09/2023.   ALLERGIES: No Known Allergies  VACCINATION STATUS: Immunization History  Administered Date(s) Administered   Fluad Quad(high Dose 65+) 11/20/2019, 12/03/2020, 12/16/2021   Fluad Trivalent(High Dose 65+) 11/30/2022   Influenza Split 12/05/2005, 12/03/2006   Influenza, High Dose Seasonal PF 12/25/2016, 01/07/2018   Influenza,inj,quad, With Preservative 12/03/2015   Influenza-Unspecified 01/30/1995, 12/13/2000, 03/16/2003, 01/21/2015, 12/25/2016, 01/07/2018, 11/21/2018   Moderna Covid-19 Fall Seasonal Vaccine 50yrs & older 11/17/2021, 01/10/2023   Moderna Covid-19 Vaccine Bivalent Booster 69yrs & up 01/18/2021   Moderna SARS-COV2 Booster Vaccination 12/31/2019, 07/04/2020   Moderna Sars-Covid-2 Vaccination 04/01/2019, 04/29/2019   PNEUMOCOCCAL CONJUGATE-20 11/30/2022   Pneumococcal Polysaccharide-23 01/30/1995, 12/05/2005, 03/11/2020   Pneumococcal-Unspecified 12/24/2018   Respiratory Syncytial Virus Vaccine,Recomb Aduvanted(Arexvy) 11/17/2021   Tdap 12/24/2018   Zoster Recombinant(Shingrix ) 03/04/2021, 06/24/2021    Thyroid  Problem Presents for follow-up visit. Symptoms include anxiety, diarrhea, fatigue, palpitations (having cardiac ablation soon) and weight loss. Patient reports no cold intolerance, constipation, depressed mood, heat intolerance, tremors or weight gain. The symptoms have been worsening.    [name] is 83 y.o. female who is being engaged in follow-up after she was seen in consultation for hypothyroidism.    PCP:  Chrystine Crate, FNP.   Patient is assisted by her daughter during visit.   Her labs confirm primary hypothyroidism. Of note, she was treated with amiodarone  for almost a year due to cardiac dysrhythmia .  Patient denies any family history  of  thyroid  dysfunction, except for her sister who had nodules on her thyroid  requiring biopsy which were benign.  She also had suspicious nodules on her thyroid  requiring biopsy.  All came back benign, even the one that needed to be sent to Afirma for confirmation.  She reports weight gain, fatigue, cold intolerance, constipation.   Review of systems  Constitutional: + decreasing body weight- per patient and daughter report- stable per weights in office,  current Body mass index is 24.99 kg/m. , + fatigue, no subjective hyperthermia, no subjective hypothermia, + decreased appetite (nothing tastes good to her anymore) Eyes: no blurry vision, no xerophthalmia ENT: no sore throat, no nodules palpated in throat, no dysphagia/odynophagia, no hoarseness, metallic taste in mouth- says feels like blisters- sees MD next week Cardiovascular: no chest pain, no shortness of breath, no palpitations, no leg swelling Respiratory: no cough, no shortness of breath Gastrointestinal: no nausea/vomiting, no diarrhea Musculoskeletal: no muscle/joint aches Skin: no rashes, no hyperemia Neurological: no tremors, no numbness, no tingling, no dizziness Psychiatric: no depression, no anxiety  Objective:    BP 132/68 (BP Location: Right Arm, Patient Position: Sitting, Cuff Size: Large)   Pulse 68   Ht 5' 4 (1.626 m)   Wt 145 lb 9.6 oz (66 kg)   LMP  (LMP Unknown)   BMI 24.99 kg/m   Wt Readings from Last 3 Encounters:  08/09/23 145 lb 9.6 oz (66 kg)  06/05/23 145 lb (65.8 kg)  05/01/23 147 lb (66.7 kg)    BP Readings from Last 3 Encounters:  08/09/23 132/68  07/13/23 (!) 166/63  06/12/23 (!) 160/60     Physical Exam- Limited  Constitutional:  Body mass index is 24.99 kg/m. , not in acute distress, normal state of mind Eyes:  EOMI, no exophthalmos Musculoskeletal: no gross deformities, strength intact in all four extremities, no gross restriction of joint movements Skin:  no rashes, no  hyperemia Neurological: no tremor with outstretched hands    CMP ( most recent) CMP     Component Value Date/Time   NA 141 11/30/2022 1411   K 4.2 11/30/2022 1411   CL 100 11/30/2022 1411   CO2 24 11/30/2022 1411   GLUCOSE 151 (H) 11/30/2022 1411   GLUCOSE 162 (H) 12/14/2021 1341   BUN 10 11/30/2022 1411   CREATININE 0.69 11/30/2022 1411   CALCIUM  9.3 11/30/2022 1411   PROT 6.3 11/30/2022 1411   ALBUMIN 4.4 11/30/2022 1411   AST 17 11/30/2022 1411   ALT 11 11/30/2022 1411   ALKPHOS 100 11/30/2022 1411   BILITOT 0.3 11/30/2022 1411   GFRNONAA >60 12/14/2021 1341   GFRAA >60 10/21/2019 1347     Diabetic Labs (most recent): Lab Results  Component Value Date   HGBA1C 5.8 (H) 07/13/2023   HGBA1C 5.9 (H) 03/06/2023   HGBA1C 5.6 11/30/2022     Lipid Panel ( most recent) Lipid Panel     Component Value Date/Time   CHOL 109 01/24/2022 1026   TRIG 87 01/24/2022 1026   HDL 41 01/24/2022 1026   CHOLHDL 2.7 01/24/2022 1026   CHOLHDL 2.4 03/09/2020 0209   VLDL 12 03/09/2020 0209   LDLCALC 51 01/24/2022 1026   LABVLDL 17 01/24/2022 1026      Lab Results  Component Value Date   TSH 0.483 08/07/2023   TSH 0.463 03/19/2023   TSH 0.646 12/15/2022   TSH 0.462 08/10/2022   TSH 0.529 07/26/2021   TSH 0.232 (L) 02/07/2021   TSH 1.995 01/10/2021  TSH 0.552 08/27/2020   TSH 0.745 08/06/2020   TSH 6.335 (H) 05/05/2020   FREET4 1.00 08/07/2023   FREET4 1.09 03/19/2023   FREET4 1.35 (H) 12/15/2022   FREET4 1.18 (H) 08/10/2022   FREET4 1.06 07/26/2021   FREET4 1.37 (H) 02/07/2021   FREET4 1.26 (H) 08/06/2020   FREET4 0.95 05/05/2020   FREET4 0.65 03/03/2020   FREET4 0.44 (L) 02/03/2020     Thyroid  US  on 04/29/20 CLINICAL DATA:  Hyperthyroidism   EXAM: THYROID  ULTRASOUND   TECHNIQUE: Ultrasound examination of the thyroid  gland and adjacent soft tissues was performed.   COMPARISON:  None.   FINDINGS: Parenchymal Echotexture: Mildly heterogeneous   Isthmus:  0.7 cm   Right lobe: 6.2 x 2.6 x 2.2 cm   Left lobe: 5.0 x 2.1 x 2.2 cm   _________________________________________________________   Estimated total number of nodules >/= 1 cm: 7   Number of spongiform nodules >/=  2 cm not described below (TR1): 0   Number of mixed cystic and solid nodules >/= 1.5 cm not described below (TR2): 0   _________________________________________________________   Nodule # 1:   Location: Right; superior   Maximum size: 1.3 cm; Other 2 dimensions: 1.3 x 1.0 cm   Composition: solid/almost completely solid (2)   Echogenicity: hypoechoic (2)   Shape: taller-than-wide (3)   Margins: smooth (0)   Echogenic foci: none (0)   ACR TI-RADS total points: 7.   ACR TI-RADS risk category: TR5 (>/= 7 points).   ACR TI-RADS recommendations:   **Given size (>/= 1.0 cm) and appearance, fine needle aspiration of this highly suspicious nodule should be considered based on TI-RADS criteria.   _________________________________________________________   Nodule # 2:   Location: Right; superior   Maximum size: 2.2 cm; Other 2 dimensions: 1.7 x 1.7 cm   Composition: solid/almost completely solid (2)   Echogenicity: isoechoic (1)   Shape: taller-than-wide (3)   Margins: smooth (0)   Echogenic foci: none (0)   ACR TI-RADS total points: 4.   ACR TI-RADS risk category: TR4 (4-6 points).   ACR TI-RADS recommendations:   **Given size (>/= 1.5 cm) and appearance, fine needle aspiration of this moderately suspicious nodule should be considered based on TI-RADS criteria.   _________________________________________________________   Nodule # 3:   Location: Right; Mid   Maximum size: 2.3 cm; Other 2 dimensions: 1.7 x 2.2 cm   Composition: solid/almost completely solid (2)   Echogenicity: hypoechoic (2)   Shape: not taller-than-wide (0)   Margins: smooth (0)   Echogenic foci: none (0)   ACR TI-RADS total points: 4.   ACR TI-RADS risk  category: TR4 (4-6 points).   ACR TI-RADS recommendations:   **Given size (>/= 1.5 cm) and appearance, fine needle aspiration of this moderately suspicious nodule should be considered based on TI-RADS criteria.   _________________________________________________________   Nodule # 4:   Location: Right; Inferior   Maximum size: 1.6 cm; Other 2 dimensions: 1.0 x 1.2 cm   Composition: mixed cystic and solid (1)   Echogenicity: isoechoic (1)   Shape: not taller-than-wide (0)   Margins: smooth (0)   Echogenic foci: none (0)   ACR TI-RADS total points: 2.   ACR TI-RADS risk category: TR2 (2 points).   ACR TI-RADS recommendations:   This nodule does NOT meet TI-RADS criteria for biopsy or dedicated follow-up.   _________________________________________________________   Nodule # 5:   Location: Left; superior   Maximum size: 1.8 cm; Other 2 dimensions: 1.3 x  1.8 cm   Composition: solid/almost completely solid (2)   Echogenicity: isoechoic (1)   Shape: not taller-than-wide (0)   Margins: smooth (0)   Echogenic foci: none (0)   ACR TI-RADS total points: 3.   ACR TI-RADS risk category: TR3 (3 points).   ACR TI-RADS recommendations:   *Given size (>/= 1.5 - 2.4 cm) and appearance, a follow-up ultrasound in 1 year should be considered based on TI-RADS criteria.   _________________________________________________________   Nodule # 6:   Location: Left; mid   Maximum size: 1.9 cm; Other 2 dimensions: 1.7 x 1.8 cm   Composition: solid/almost completely solid (2)   Echogenicity: isoechoic (1)   Shape: taller-than-wide (3)   Margins: ill-defined (0)   Echogenic foci: none (0)   ACR TI-RADS total points: 6.   ACR TI-RADS risk category: TR4 (4-6 points).   ACR TI-RADS recommendations:   **Given size (>/= 1.5 cm) and appearance, fine needle aspiration of this moderately suspicious nodule should be considered based on TI-RADS criteria.    _________________________________________________________   Nodule # 7: 1.1 cm cystic nodule in the inferior left thyroid  lobe does not meet criteria for FNA or imaging follow-up.   IMPRESSION: 1. Nodule 1 (TI-RADS 5) located in the superior right thyroid  lobe, measuring 1.3 x 1.3 x 1.0 cm, meets criteria for FNA. 2. Nodule 2 (TI-RADS 4), located in the superior right thyroid  lobe, measuring 2.2 x 1.7 x 1.7 cm, meets criteria for FNA. 3. Nodule 3 (TI-RADS 4) located in the mid right thyroid  lobe, measuring 2.3 x 1.7 x 2.2 cm, meets criteria for FNA. 4. Nodule 5 (TI-RADS 3) located in the superior left thyroid  lobe, measuring 1.8 x 1.3 x 1.8 cm, meets criteria for imaging follow-up. Next ultrasound should be performed in 1 year. 5. Nodule 6 (TI-RADS 4), located in the mid left thyroid  lobe, measuring 1.9 x 1.7 x 1.8 cm, meets criteria for FNA. 6. Order of suspicion of nodules for consideration for FNA: 1, 3, 2, 6   The above is in keeping with the ACR TI-RADS recommendations - J Am Coll Radiol 2017;14:587-595.     Electronically Signed   By: Elester Grim M.D.   On: 04/29/2020 16:12 -------------------------------------------------------------------------------------------------------------------------- Cytology - Non PAP  CASE: [id number]  PATIENT: [name]  Non-Gynecological Cytology Report    FNA Thyroid  Nodule Biopsy  Clinical History: 2.3 cm RML  Specimen Submitted:  A. THYROID , RML, FINE NEEDLE ASPIRATION:    FINAL MICROSCOPIC DIAGNOSIS:  - Atypia of undetermined significance (Bethesda category III)   SPECIMEN ADEQUACY:  Satisfactory for evaluation   DIAGNOSTIC COMMENTS:  This specimen will be sent for Afirma testing.   GROSS:  Received is/are 3 slides in 95% Ethyl alcohol, 3 air dried slides for  diff stain, and 30 ccs of pale pink Cytolyt solution. (CM:cm)  Prepared:  Smears:  6  Concentration Method (Thin Prep):  1  Cell Block:  Cell block  attempted, not obtained.  Additional Studies:  Also there was an Afirma collected. For RUL Thyroid   FNA, see [id number].   CYTOLOGY - NON PAP  CASE: [id number]  PATIENT: [name]  Non-Gynecological Cytology Report          FNA Thyroid  Nodule Biopsy  Clinical History: 1.3 cm RUL  Specimen Submitted:  A. THYROID , RUL, FINE NEEDLE ASPIRATION:    FINAL MICROSCOPIC DIAGNOSIS:  - Consistent with benign follicular nodule (Bethesda category II)   SPECIMEN ADEQUACY:  Satisfactory for evaluation   GROSS:  Received is/are  4 slides in 95% Ethyl alcohol, 4 air dried slides for  diff stain, and 30 ccs of pale pink Cytolyt solution. (CM:cm)  Prepared:  Smears:  8  Concentration Method (Thin Prep):  1  Cell Block:  Cell block attempted, not obtained.  Additional Studies:  Also there was an Afirma collected. For RML Thyroid   FNA, see [id number].    Repeat thyroid  US  from 07/26/21 CLINICAL DATA:  Goiter. Multifocal thyroid  ultrasound. Patient previously underwent biopsy of right superior and right mid thyroid  nodules in March of 2022   EXAM: THYROID  ULTRASOUND   TECHNIQUE: Ultrasound examination of the thyroid  gland and adjacent soft tissues was performed.   COMPARISON:  Prior thyroid  ultrasound 04/29/2020   FINDINGS: Parenchymal Echotexture: Markedly heterogenous   Isthmus: 0.5 cm   Right lobe: 4.5 x 2.1 x 2.4 cm   Left lobe: 4.5 x 2.0 x 2.1 cm   _________________________________________________________   Estimated total number of nodules >/= 1 cm: 5   Number of spongiform nodules >/=  2 cm not described below (TR1): 0   Number of mixed cystic and solid nodules >/= 1.5 cm not described below (TR2): 0   _________________________________________________________   The thyroid  gland is diffusely heterogeneous, enlarged and nodular. Many of the areas of nodularity are inseparable from the background thyroid  parenchyma and most likely to represent areas  of pseudo nodularity.   Nodule # 1: The previously biopsied nodule/cluster of nodules in the right superior gland demonstrates no significant interval change compared to prior imaging obtained at the time of biopsy.   Nodule # 3: The previously biopsied nodule with central dystrophic calcifications in the right mid to lower gland measures 1.9 x 1.7 x 1.2 cm; slightly smaller than 2.3 by 1.7 x 2.2 cm previously.   Additional measured areas either represent benign-appearing spongiform nodules, or vague regions of pseudo nodularity. No other discrete nodules identified that would warrant further evaluation.   IMPRESSION: Overall, unchanged appearance of the thyroid  gland which is diffusely enlarged, heterogeneous and lobular with multiple regions of pseudonodularity.   The previously biopsied nodules in the right upper and right mid gland demonstrate no significant interval change.   No additional foci of definitive nodularity that would warrant biopsy or dedicated imaging surveillance.     Electronically Signed   By: Fernando Hoyer M.D.   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  She is advised to continue dose of Levothyroxine  25 mcg po daily before breakfast.    - We discussed about the correct intake of her thyroid  hormone, on empty stomach at fasting, with water, separated by at least 30 minutes from breakfast and other medications,  and separated by more than 4 hours from calcium , iron, multivitamins, acid reflux medications (PPIs). -Patient is made aware of the fact  that thyroid  hormone replacement is needed for life, dose to be adjusted by periodic monitoring of thyroid  function tests.   2. Multinodular Goiter -Her thyroid  ultrasound shows multinodular goiter with 4 moderately suspicious nodules that meet criteria for FNA and 1 nodule recommending follow up with ultrasound in 1 year.  All biopsies were confirmed to be benign.   Her follow up ultrasound shows stable MNG, no further surveillance is needed at this time.    - she is advised to maintain close follow up with Milian, Winda Hastings, FNP for primary care needs.     I spent  15  minutes in the care of the patient today including review of labs from Thyroid  Function, CMP, and other relevant labs ; imaging/biopsy records (current and previous including abstractions from other facilities); face-to-face time discussing  her lab results and symptoms, medications doses, her options of short and long term treatment based on the latest standards of care / guidelines;   and documenting the encounter.  Gar Julian  participated in the discussions, expressed understanding, and voiced agreement with the above plans.  All questions were answered to her satisfaction. she is encouraged to contact clinic should she have any questions or concerns prior to her return visit.   Follow up plan: Return in about 6 months (around 02/08/2024) for Thyroid  follow up, Previsit labs.  Hulon Magic, [facility] [address] [place], [place] [address] Phone: [phone] Fax: [phone]   08/09/2023, 3:08 PM

## 2023-08-09 NOTE — Patient Instructions (Signed)

## 2023-08-14 NOTE — Progress Notes (Signed)
 Carelink Summary Report / Loop Recorder

## 2023-08-15 DIAGNOSIS — R202 Paresthesia of skin: Secondary | ICD-10-CM | POA: Diagnosis not present

## 2023-08-15 DIAGNOSIS — R519 Headache, unspecified: Secondary | ICD-10-CM | POA: Diagnosis not present

## 2023-08-15 MED ORDER — LOSARTAN POTASSIUM 50 MG PO TABS: 75.0000 mg | ORAL_TABLET | Freq: Every day | ORAL | Status: DC

## 2023-08-15 NOTE — Telephone Encounter (Signed)
 Patient is aware

## 2023-08-15 NOTE — Addendum Note (Signed)
 Addended by: CATHLENE KAUFMANN on: 08/15/2023 07:55 AM   Modules accepted: Orders

## 2023-08-15 NOTE — Telephone Encounter (Signed)
 Please have patient start taking 75 mg of losartan , 1.5 tablets. Continue to monitor BP at home.

## 2023-08-15 NOTE — Telephone Encounter (Signed)
 Patient states she is on 50 mg now.  Please clarify how you want patient to take medication. 1.5 tablets of the 50mg  or were you sending in 75mg  and want her to take 1.5 tablets?

## 2023-08-21 ENCOUNTER — Telehealth: Payer: Self-pay | Admitting: *Deleted

## 2023-08-27 ENCOUNTER — Ambulatory Visit (INDEPENDENT_AMBULATORY_CARE_PROVIDER_SITE_OTHER)

## 2023-08-27 DIAGNOSIS — I4891 Unspecified atrial fibrillation: Secondary | ICD-10-CM

## 2023-08-27 LAB — CUP PACEART REMOTE DEVICE CHECK
Date Time Interrogation Session: 20250706230641
Implantable Pulse Generator Implant Date: 20220210

## 2023-08-28 ENCOUNTER — Ambulatory Visit: Payer: Self-pay | Admitting: Cardiovascular Disease

## 2023-08-28 ENCOUNTER — Other Ambulatory Visit: Payer: Self-pay | Admitting: Cardiology

## 2023-08-28 DIAGNOSIS — H02831 Dermatochalasis of right upper eyelid: Secondary | ICD-10-CM | POA: Diagnosis not present

## 2023-08-28 DIAGNOSIS — H26491 Other secondary cataract, right eye: Secondary | ICD-10-CM | POA: Diagnosis not present

## 2023-08-28 DIAGNOSIS — H04123 Dry eye syndrome of bilateral lacrimal glands: Secondary | ICD-10-CM | POA: Diagnosis not present

## 2023-08-28 DIAGNOSIS — H401131 Primary open-angle glaucoma, bilateral, mild stage: Secondary | ICD-10-CM | POA: Diagnosis not present

## 2023-08-28 DIAGNOSIS — E119 Type 2 diabetes mellitus without complications: Secondary | ICD-10-CM | POA: Diagnosis not present

## 2023-08-28 DIAGNOSIS — H02834 Dermatochalasis of left upper eyelid: Secondary | ICD-10-CM | POA: Diagnosis not present

## 2023-08-28 DIAGNOSIS — Z961 Presence of intraocular lens: Secondary | ICD-10-CM | POA: Diagnosis not present

## 2023-08-28 LAB — HM DIABETES EYE EXAM

## 2023-08-28 NOTE — Telephone Encounter (Signed)
 Prescription refill request for Eliquis  received. Indication: AF Last office visit: 12/26/22  GORMAN Sierras MD Scr: 0.69 on 11/30/22  Epic Age: 83 Weight: 68.8kg  Based on above findings Eliquis  5mg  twice daily is the appropriate dose.  Refill approved.

## 2023-08-29 ENCOUNTER — Telehealth: Payer: Self-pay | Admitting: *Deleted

## 2023-08-29 ENCOUNTER — Encounter: Payer: Self-pay | Admitting: *Deleted

## 2023-09-03 ENCOUNTER — Other Ambulatory Visit: Payer: Self-pay | Admitting: Cardiology

## 2023-09-07 ENCOUNTER — Encounter: Payer: Self-pay | Admitting: Cardiology

## 2023-09-07 ENCOUNTER — Ambulatory Visit: Attending: Cardiology | Admitting: Cardiology

## 2023-09-07 VITALS — BP 140/68 | HR 62 | Ht 64.0 in | Wt 144.2 lb

## 2023-09-07 DIAGNOSIS — I25119 Atherosclerotic heart disease of native coronary artery with unspecified angina pectoris: Secondary | ICD-10-CM | POA: Diagnosis not present

## 2023-09-07 DIAGNOSIS — E782 Mixed hyperlipidemia: Secondary | ICD-10-CM | POA: Diagnosis not present

## 2023-09-07 DIAGNOSIS — I4819 Other persistent atrial fibrillation: Secondary | ICD-10-CM

## 2023-09-07 DIAGNOSIS — I10 Essential (primary) hypertension: Secondary | ICD-10-CM | POA: Diagnosis not present

## 2023-09-07 DIAGNOSIS — I4891 Unspecified atrial fibrillation: Secondary | ICD-10-CM | POA: Diagnosis not present

## 2023-09-07 NOTE — Progress Notes (Signed)
    Cardiology Office Note  Date: 09/07/2023   ID: [name], DOB 1940-09-18, MRN [id number]  History of Present Illness: [name] is an 83 y.o. female last seen in November 2024.  She is here for a follow-up visit.  She reports chronic fatigue, no exertional chest pain or worsening dyspnea.  She has noticed more palpitations in the last month.  ILR in place with follow-up with Dr. Nancey.  Approximately 20% AF burden by interrogation in July, up from 11% in March.  We went over her medications.  She continues on stable cardiac regimen.  No spontaneous bleeding problems reported on Eliquis .  Cozaar  has been advanced to 75 mg daily in the interim.  I rechecked her blood pressure today at 140/68.  She has a cuff at home.  ECG today shows sinus rhythm with PAC.  Physical Exam: VS:  BP (!) 140/68 (BP Location: Left Arm)   Pulse 62   Ht 5' 4 (1.626 m)   Wt 144 lb 3.2 oz (65.4 kg)   LMP  (LMP Unknown)   SpO2 99%   BMI 24.75 kg/m , BMI Body mass index is 24.75 kg/m.  Wt Readings from Last 3 Encounters:  09/07/23 144 lb 3.2 oz (65.4 kg)  08/09/23 145 lb 9.6 oz (66 kg)  06/05/23 145 lb (65.8 kg)    General: Patient appears comfortable at rest. HEENT: Conjunctiva and lids normal. Neck: Supple, no elevated JVP or carotid bruits. Lungs: Clear to auscultation, nonlabored breathing at rest. Cardiac: Regular rate and rhythm with ectopy, no S3,1/6 systolic murmur, no pericardial rub. Extremities: No pitting edema.  ECG:  An ECG dated 07/05/2022 was personally reviewed today and demonstrated:  Sinus rhythm with PACs.  Labwork: 11/30/2022: ALT 11; AST 17; BUN 10; Creatinine, Ser 0.69; Hemoglobin 12.7; Platelets 185; Potassium 4.2; Sodium 141 08/07/2023: TSH 0.483    Other Studies Reviewed Today:  No interval cardiac testing for review today.  Assessment and Plan:  1.  CAD status post DES to the LAD in 2010, DES to the LAD in 2017, and DES to the ramus intermedius as well as  DES x 2 to the mid RCA in 2022.  LVEF 55 to 60% by echocardiogram in November 2023.  She does not report any angina or interval nitroglycerin  use.  Continue Crestor  20 mg daily.   2.  Persistent atrial fibrillation with CHA2DS2-VASc score of 6 status post atrial fibrillation ablation by Dr. Kelsie in December 2022.  Previously taken off of amiodarone  due to hypothyroidism.  She had approximately 20% AF burden by last ILR interrogation.  Continue Eliquis  5 mg twice daily for stroke prophylaxis.  Depending on continued trend in rhythm frequency, may need to consider follow-up with Dr. Nancey for discussion of Tikosyn.  Otherwise on metoprolol  XL 100 mg daily and Cardizem  CD 300 mg daily.   3.  Primary hypertension.  Cozaar  increased to 75 mg daily recently.  Continue to track at home.  She will see her PCP in August.  May need to advance dose to 100 mg daily ultimately.   4.  Mixed hyperlipidemia on Crestor  20 mg daily.  LDL 51 in December 2023.  Disposition:  Follow up 6 months.  Signed, Jayson JUDITHANN Sierras, M.D., F.A.C.C. [facility]

## 2023-09-07 NOTE — Patient Instructions (Addendum)

## 2023-09-13 NOTE — Progress Notes (Signed)
 Carelink Summary Report / Loop Recorder

## 2023-09-18 ENCOUNTER — Telehealth: Payer: Self-pay | Admitting: Cardiology

## 2023-09-18 NOTE — Telephone Encounter (Signed)
*  STAT* If patient is at the pharmacy, call can be transferred to refill team.   1. Which medications need to be refilled? (please list name of each medication and dose if known)   losartan  (COZAAR ) 50 MG tablet     4. Which pharmacy/location (including street and city if local pharmacy) is medication to be sent to? [facility] [place], [place] - [address] Phone: [phone]  Fax: [phone]        5. Do they need a 30 day or 90 day supply? 90

## 2023-09-19 ENCOUNTER — Telehealth: Payer: Self-pay

## 2023-09-19 MED ORDER — LOSARTAN POTASSIUM 50 MG PO TABS: 75.0000 mg | ORAL_TABLET | Freq: Every day | ORAL | 3 refills | Status: AC

## 2023-09-19 NOTE — Telephone Encounter (Signed)
 Copied from CRM [id number]. Topic: Clinical - Prescription Issue >> Sep 19, 2023  2:09 PM Nathanel BROCKS wrote: Reason for CRM:    losartan  (COZAAR ) 50 MG table  Pt was on 50 mg and Gabrielle upped the dosage to 1 1/5 pills a day and now she is running short on medication. Could you please refill as she is out of medication.  [facility], [place] - [address] [place] [[id number]]  Please advise

## 2023-09-19 NOTE — Telephone Encounter (Signed)
 Per note in chart patient must get refills from cardiology.  Patient aware and verbalizes understanding.

## 2023-09-19 NOTE — Telephone Encounter (Signed)
 Pt will contact pcp office for refill

## 2023-09-19 NOTE — Telephone Encounter (Signed)
 Pt is calling back stating she is completely out

## 2023-09-27 ENCOUNTER — Ambulatory Visit

## 2023-09-27 DIAGNOSIS — I4891 Unspecified atrial fibrillation: Secondary | ICD-10-CM | POA: Diagnosis not present

## 2023-09-27 LAB — CUP PACEART REMOTE DEVICE CHECK
Date Time Interrogation Session: 20250806230639
Implantable Pulse Generator Implant Date: 20220210

## 2023-09-28 ENCOUNTER — Other Ambulatory Visit: Payer: Self-pay | Admitting: Cardiology

## 2023-10-02 ENCOUNTER — Ambulatory Visit: Payer: Self-pay | Admitting: Cardiovascular Disease

## 2023-10-16 ENCOUNTER — Ambulatory Visit: Admitting: Family Medicine

## 2023-10-16 ENCOUNTER — Ambulatory Visit: Payer: Self-pay | Admitting: Family Medicine

## 2023-10-16 ENCOUNTER — Encounter: Payer: Self-pay | Admitting: Family Medicine

## 2023-10-16 VITALS — BP 157/65 | HR 56 | Temp 97.6°F | Ht 64.0 in | Wt 142.2 lb

## 2023-10-16 DIAGNOSIS — Z95818 Presence of other cardiac implants and grafts: Secondary | ICD-10-CM

## 2023-10-16 DIAGNOSIS — M5442 Lumbago with sciatica, left side: Secondary | ICD-10-CM | POA: Diagnosis not present

## 2023-10-16 DIAGNOSIS — E1159 Type 2 diabetes mellitus with other circulatory complications: Secondary | ICD-10-CM | POA: Diagnosis not present

## 2023-10-16 DIAGNOSIS — G8929 Other chronic pain: Secondary | ICD-10-CM

## 2023-10-16 DIAGNOSIS — M5441 Lumbago with sciatica, right side: Secondary | ICD-10-CM | POA: Diagnosis not present

## 2023-10-16 DIAGNOSIS — D6869 Other thrombophilia: Secondary | ICD-10-CM | POA: Diagnosis not present

## 2023-10-16 DIAGNOSIS — I152 Hypertension secondary to endocrine disorders: Secondary | ICD-10-CM | POA: Diagnosis not present

## 2023-10-16 DIAGNOSIS — Z95810 Presence of automatic (implantable) cardiac defibrillator: Secondary | ICD-10-CM | POA: Insufficient documentation

## 2023-10-16 DIAGNOSIS — Z79899 Other long term (current) drug therapy: Secondary | ICD-10-CM

## 2023-10-16 DIAGNOSIS — R200 Anesthesia of skin: Secondary | ICD-10-CM | POA: Diagnosis not present

## 2023-10-16 DIAGNOSIS — M25551 Pain in right hip: Secondary | ICD-10-CM

## 2023-10-16 DIAGNOSIS — I4819 Other persistent atrial fibrillation: Secondary | ICD-10-CM

## 2023-10-16 DIAGNOSIS — I4891 Unspecified atrial fibrillation: Secondary | ICD-10-CM

## 2023-10-16 DIAGNOSIS — E785 Hyperlipidemia, unspecified: Secondary | ICD-10-CM

## 2023-10-16 DIAGNOSIS — M25552 Pain in left hip: Secondary | ICD-10-CM | POA: Diagnosis not present

## 2023-10-16 DIAGNOSIS — E1169 Type 2 diabetes mellitus with other specified complication: Secondary | ICD-10-CM

## 2023-10-16 DIAGNOSIS — F339 Major depressive disorder, recurrent, unspecified: Secondary | ICD-10-CM

## 2023-10-16 LAB — BAYER DCA HB A1C WAIVED: HB A1C (BAYER DCA - WAIVED): 5.9 % — ABNORMAL HIGH (ref 4.8–5.6)

## 2023-10-16 MED ORDER — HYDROCODONE-ACETAMINOPHEN 7.5-325 MG PO TABS: 1.0000 | ORAL_TABLET | Freq: Four times a day (QID) | ORAL | 0 refills | Status: DC | PRN

## 2023-10-16 MED ORDER — DULOXETINE HCL 60 MG PO CPEP: 60.0000 mg | ORAL_CAPSULE | Freq: Every day | ORAL | 1 refills | Status: AC

## 2023-10-16 NOTE — Progress Notes (Signed)
 Subjective:  Patient ID: [name], female    DOB: [birth year], 83 y.o.   MRN: [id number]  Patient Care Team: [name], FNP as PCP - General (Family Medicine) [name], MD as PCP - Cardiology (Cardiology) Mealor, Eulas BRAVO, MD as PCP - Electrophysiology (Cardiology) [name], MD as Consulting Physician (Otolaryngology) [name], [facility] (Pharmacist) [name], NP as Nurse Practitioner (Endocrinology) [facility] [name], RN as VBCI Care Management (General Practice) [name], RN   Chief Complaint:  Establish Care (Former Marry patient ) and Medical Management of Chronic Issues   HPI: [name] is a 83 y.o. female presenting on 10/16/2023 for Establish Care (Former Marry patient ) and Medical Management of Chronic Issues   [name] is an 83 year old female with persistent atrial fibrillation who presents to establish care with new PCP and for management of chronic medical conditions.   She has a history of persistent atrial fibrillation and has an ILR. She undergoes regular checks of her ILR, which are typically performed at the beginning and end of each month, and sees her cardiologist every six months. Recently, she experienced some episodes of atrial fibrillation, which she describes as 'acting up a little bit' but not lasting too long. She is on blood thinners with no abnormal bleeding or bruising.  She is currently taking metoprolol , losartan , and Cardizem  for blood pressure and heart rate control. Her losartan  dosage was recently increased to 75 mg daily, which has helped lower her blood pressure from consistently high readings. Her last home blood pressure reading was approximately 140/70 mmHg. [name] or leg swelling, but she occasionally experiences sudden headaches.  She has a history of diabetes and is on metformin  without any issues such as increased hunger, thirst, or urination.  She also takes duloxetine  60 mg daily for depression and anxiety, which was increased to help with persistent mouth numbness and soreness on the right side, a condition that has been present since before the COVID pandemic. Despite the medication adjustment, the mouth symptoms persist.  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 She takes hydrocodone  once daily for pain management, which provides minimal relief, reducing her pain from an 8 to a 6 or 7 on a scale of 10. Her pain affects her daily activities, requiring her to take frequent breaks. She also experiences constipation from the hydrocodone , which she manages with prunes and hot coffee.  She has a history of a bulging disc, contributing to her leg pain, which she describes as worse than her back pain. She has not had any recent falls and denies dizziness or drowsiness. She is able to perform daily activities but needs to pace herself due to pain.       Pain assessment: Cause of pain- chronic back pain Pain location- back, hips, legs Pain on scale of 1-10- 8/10 Frequency- daily What increases pain-anything What makes pain Better-nothing Effects on ADL - minimal-moderate depending on pain Any change in general medical condition-no  Current opioids rx- hydrocodone  7.5 daily # meds rx- 30 Effectiveness of current meds-minimal Adverse reactions from pain meds-constipation  Morphine  equivalent- 30 MME/Day  Pill count performed-No Last drug screen - 11/30/2022 Urine drug screen today- Yes Was the [facility] reviewed- yes  If yes were their any concerning findings? - none  Pain contract signed on:10/16/2023    Relevant past medical, surgical,  family, and social history reviewed and updated as indicated.  Allergies and medications reviewed and updated. Data reviewed: Chart in Epic.   Past Medical History:  Diagnosis Date   Anxiety    Arthritis    Bulging lumbar disc     Chronic lower back pain    Coronary atherosclerosis of native coronary artery    DES LAD 02/2008, DES LAD 02/2015, DES ramus and DES x2 mid RCA 02/2020   Depression    Dyslipidemia    Essential hypertension    Facial numbness    Frequent headaches    GERD (gastroesophageal reflux disease)    Glaucoma    Heart attack (HCC) 02/2020   Insomnia    Paroxysmal atrial fibrillation (HCC)    Diagnosed October 2019   PSVT (paroxysmal supraventricular tachycardia) (HCC)    Refusal of blood transfusions as patient is Jehovah's Witness    Thyroid  disease    Type 2 diabetes mellitus (HCC)    Borderline   Vitamin D  deficiency     Past Surgical History:  Procedure Laterality Date   ATRIAL FIBRILLATION ABLATION N/A 02/16/2021   Procedure: ATRIAL FIBRILLATION ABLATION;  Surgeon: Kelsie Agent, MD;  Location: [facility] INVASIVE CV LAB;  Service: Cardiovascular;  Laterality: N/A;   BIOPSY  09/21/2021   Procedure: BIOPSY;  Surgeon: Shaaron Lamar HERO, MD;  Location: [facility] ENDO SUITE;  Service: Endoscopy;;  gastric   CARDIAC CATHETERIZATION N/A 02/23/2015   Procedure: Left Heart Cath and Coronary Angiography;  Surgeon: Ozell Fell, MD;  Location: [facility] INVASIVE CV LAB;  Service: Cardiovascular;  Laterality: N/A;   CARDIAC CATHETERIZATION N/A 02/23/2015   Procedure: Coronary Stent Intervention;  Surgeon: Ozell Fell, MD;  Location: [facility] INVASIVE CV LAB;  Service: Cardiovascular;  Laterality: N/A;   CARDIAC CATHETERIZATION N/A 01/04/2016   Procedure: Left Heart Cath and Coronary Angiography;  Surgeon: Peter M Swaziland, MD;  Location: [facility] INVASIVE CV LAB;  Service: Cardiovascular;  Laterality: N/A;   CATARACT EXTRACTION     CORONARY ANGIOPLASTY  02/23/2015   CORONARY ANGIOPLASTY WITH STENT PLACEMENT  2009   CORONARY STENT INTERVENTION N/A 03/09/2020   Procedure: CORONARY STENT INTERVENTION;  Surgeon: Dann Candyce RAMAN, MD;  Location: [facility] INVASIVE CV LAB;  Service: Cardiovascular;  Laterality: N/A;   DILATION AND  CURETTAGE OF UTERUS     ESOPHAGOGASTRODUODENOSCOPY (EGD) WITH PROPOFOL  N/A 09/21/2021   normal esophagus, abnormal gastric mucosa s/p biopsy, normal duodenum. Pathology with H.pylori. Prescribed Prevpac.   implantable loop recorder placement  04/01/2020   Medtronic Reveal [place] model LNQ 22 [id number]) implantable loop recorder    LEFT HEART CATH AND CORONARY ANGIOGRAPHY N/A 03/09/2020   Procedure: LEFT HEART CATH AND CORONARY ANGIOGRAPHY;  Surgeon: Dann Candyce RAMAN, MD;  Location: [facility] INVASIVE CV LAB;  Service: Cardiovascular;  Laterality: N/A;   TUBAL LIGATION      Social History   Socioeconomic History   Marital status: Widowed    Spouse name: Not on file   Number of children: 8   Years of education: 10th grade   Highest education level: 10th grade  Occupational History   Occupation: RETIRED  Tobacco Use   Smoking status: Never   Smokeless tobacco: Never   Tobacco comments:    Never smoke 11/15/21  Vaping Use   Vaping status: Never Used  Substance and Sexual Activity   Alcohol use: Not Currently    Alcohol/week: 1.0 standard drink of alcohol    Types: 1 Glasses of wine per week    Comment: RARE OCCASION  Drug use: No   Sexual activity: Not Currently  Other Topics Concern   Not on file  Social History Narrative   Lives at home alone.   Right-handed.   No caffeine use.   Daughter lives next door and helps her out   Social Drivers of Health   Financial Resource Strain: Low Risk  (03/21/2023)   [facility]   Overall Financial Resource Strain (CARDIA)    Difficulty of Paying Living Expenses: Not very hard  Food Insecurity: No Food Insecurity (06/12/2023)   Hunger Vital Sign    Worried About Running Out of Food in the Last Year: Never true    Ran Out of Food in the Last Year: Never true  Transportation Needs: No Transportation Needs (06/12/2023)   PRAPARE - Administrator, Civil Service (Medical): No    Lack of Transportation  (Non-Medical): No  Physical Activity: Inactive (03/05/2023)   Exercise Vital Sign    Days of Exercise per Week: 0 days    Minutes of Exercise per Session: 0 min  Stress: No Stress Concern Present (03/05/2023)   [employer] of Occupational Health - Occupational Stress Questionnaire    Feeling of Stress : Not at all  Social Connections: Moderately Isolated (03/05/2023)   Social Connection and Isolation Panel    Frequency of Communication with Friends and Family: More than three times a week    Frequency of Social Gatherings with Friends and Family: Three times a week    Attends Religious Services: More than 4 times per year    Active Member of Clubs or Organizations: No    Attends Banker Meetings: Never    Marital Status: Widowed  Intimate Partner Violence: Not At Risk (06/12/2023)   Humiliation, Afraid, Rape, and Kick questionnaire    Fear of Current or Ex-Partner: No    Emotionally Abused: No    Physically Abused: No    Sexually Abused: No    Outpatient Encounter Medications as of 10/16/2023  Medication Sig   acetaminophen  (TYLENOL ) 500 MG tablet Take 1,000 mg by mouth every 6 (six) hours as needed for moderate pain.   Ascorbic Acid  (VITAMIN C ) 500 MG tablet Take 500 mg by mouth daily.     bimatoprost (LUMIGAN) 0.01 % SOLN Place 1 drop into both eyes at bedtime.   Calcium  Carbonate Antacid (TUMS PO) Take 2 tablets by mouth 4 (four) times daily as needed (for acid reflux/indigestion).    Camphor-Menthol -Methyl Sal (SALONPAS ) 3.02-26-08 % PTCH Apply 1 patch topically daily as needed (pain).   cetirizine (ZYRTEC) 10 MG tablet Take 10 mg by mouth daily as needed for allergies.   Cyanocobalamin  (VITAMIN B 12 PO) Take 1 tablet by mouth daily.   diclofenac  Sodium (VOLTAREN ) 1 % GEL Apply 2 g topically 4 (four) times daily.   diltiazem  (CARDIZEM  CD) 300 MG 24 hr capsule Take 1 capsule (300 mg total) by mouth daily. PLEASE KEEP UPCOMING APPOINTMENT IN ORDER TO RECEIVE ADDITIONAL  REFILLS. THANK YOU!   ELIQUIS  5 MG TABS tablet TAKE ONE TABLET BY MOUTH TWICE DAILY   GARLIC  PO Take 1 tablet by mouth daily.   levothyroxine  (SYNTHROID ) 25 MCG tablet Take 1 tablet (25 mcg total) by mouth daily before breakfast.   lidocaine  (HM LIDOCAINE  PATCH) 4 % Place 2 patches onto the skin daily.   losartan  (COZAAR ) 50 MG tablet Take 1.5 tablets (75 mg total) by mouth daily.   MAGNESIUM  CARBONATE PO Take 1 tablet by mouth  at bedtime.   metFORMIN  (GLUCOPHAGE ) 500 MG tablet Take 1 tablet (500 mg total) by mouth 2 (two) times daily with a meal.   metoprolol  succinate (TOPROL -XL) 100 MG 24 hr tablet TAKE 1 TABLET BY MOUTH DAILY WITH FOOD   nitroGLYCERIN  (NITROSTAT ) 0.4 MG SL tablet DISSOLVE 1 TAB UNDER TOUNGE FOR CHEST PAIN. MAY REPEAT EVERY 5 MINUTES FOR 3 DOSES. IF NO RELIEF CALL 911 OR GO TO ER   rosuvastatin  (CRESTOR ) 20 MG tablet TAKE ONE (1) TABLET BY MOUTH EVERY DAY   SPIKEVAX syringe    timolol  (TIMOPTIC ) 0.5 % ophthalmic solution Place 1 drop into both eyes daily.   traZODone  (DESYREL ) 150 MG tablet Take 1 tablet (150 mg total) by mouth at bedtime.   TURMERIC PO Take 1 tablet by mouth daily.   VENTOLIN  HFA 108 (90 Base) MCG/ACT inhaler Inhale 1-2 puffs into the lungs every 4 (four) hours as needed for shortness of breath.   Vitamin D , Cholecalciferol , 10 MCG (400 UNIT) CAPS Take 400 Units by mouth daily.   [DISCONTINUED] DULoxetine  (CYMBALTA ) 30 MG capsule Take 30 mg by mouth daily.   [DISCONTINUED] DULoxetine  (CYMBALTA ) 60 MG capsule Take 60 mg by mouth daily.   [DISCONTINUED] HYDROcodone -acetaminophen  (NORCO) 7.5-325 MG tablet Take 1 tablet by mouth every 6 (six) hours as needed for moderate pain (pain score 4-6).   [DISCONTINUED] HYDROcodone -acetaminophen  (NORCO) 7.5-325 MG tablet Take 1 tablet by mouth every 6 (six) hours as needed for moderate pain (pain score 4-6).   [DISCONTINUED] HYDROcodone -acetaminophen  (NORCO) 7.5-325 MG tablet Take 1 tablet by mouth every 6 (six) hours as  needed for moderate pain (pain score 4-6).   DULoxetine  (CYMBALTA ) 60 MG capsule Take 1 capsule (60 mg total) by mouth daily.   HYDROcodone -acetaminophen  (NORCO) 7.5-325 MG tablet Take 1 tablet by mouth every 6 (six) hours as needed for moderate pain (pain score 4-6).   [START ON 11/15/2023] HYDROcodone -acetaminophen  (NORCO) 7.5-325 MG tablet Take 1 tablet by mouth every 6 (six) hours as needed for moderate pain (pain score 4-6).   [START ON 12/15/2023] HYDROcodone -acetaminophen  (NORCO) 7.5-325 MG tablet Take 1 tablet by mouth every 6 (six) hours as needed for moderate pain (pain score 4-6).   No facility-administered encounter medications on file as of 10/16/2023.    No Known Allergies  Pertinent ROS per HPI, otherwise unremarkable      Objective:  BP (!) 157/65   Pulse (!) 56   Temp 97.6 F (36.4 C)   Ht 5' 4 (1.626 m)   Wt 142 lb 3.2 oz (64.5 kg)   LMP  (LMP Unknown)   SpO2 97%   BMI 24.41 kg/m    Wt Readings from Last 3 Encounters:  10/16/23 142 lb 3.2 oz (64.5 kg)  09/07/23 144 lb 3.2 oz (65.4 kg)  08/09/23 145 lb 9.6 oz (66 kg)    Physical Exam Vitals and nursing note reviewed.  Constitutional:      General: She is not in acute distress.    Appearance: Normal appearance. She is not ill-appearing, toxic-appearing or diaphoretic.  HENT:     Head: Normocephalic and atraumatic.     Nose: Nose normal.     Mouth/Throat:     Mouth: Mucous membranes are moist.  Eyes:     Conjunctiva/sclera: Conjunctivae normal.     Pupils: Pupils are equal, round, and reactive to light.  Cardiovascular:     Rate and Rhythm: Normal rate. Rhythm irregularly irregular.     Heart sounds: Normal heart sounds. No  murmur heard.    No friction rub. No gallop.  Pulmonary:     Effort: Pulmonary effort is normal.     Breath sounds: Normal breath sounds.  Abdominal:     General: Bowel sounds are normal.     Palpations: Abdomen is soft.  Musculoskeletal:     Thoracic back: Normal.     Lumbar  back: Tenderness present. No swelling, edema, deformity, lacerations, spasms or bony tenderness. Decreased range of motion. Positive right straight leg raise test and positive left straight leg raise test. No scoliosis.     Right lower leg: No edema.     Left lower leg: No edema.  Skin:    General: Skin is warm and dry.     Capillary Refill: Capillary refill takes less than 2 seconds.  Neurological:     General: No focal deficit present.     Mental Status: She is alert and oriented to person, place, and time.     Gait: Gait abnormal (antalgic).  Psychiatric:        Mood and Affect: Mood normal.        Behavior: Behavior normal.        Thought Content: Thought content normal.        Judgment: Judgment normal.      Results for orders placed or performed in visit on 09/27/23  CUP PACEART REMOTE DEVICE CHECK   Collection Time: 09/26/23 11:06 PM  Result Value Ref Range   Date Time Interrogation Session [id number]    Pulse Generator Manufacturer MERM    Pulse Gen Model LNQ22 LINQ II    Pulse Gen Serial Number [id number]    Clinic Name [facility]    Implantable Pulse Generator Type ICM/ILR    Implantable Pulse Generator Implant Date [id number]        Pertinent labs & imaging results that were available during my care of the patient were reviewed by me and considered in my medical decision making.  Assessment & Plan:  [name] was seen today for establish care and medical management of chronic issues.  Diagnoses and all orders for this visit:  Type 2 diabetes mellitus with other specified complication, without long-term current use of insulin  (HCC) -     Bayer DCA Hb A1c Waived -     CBC with Differential/Platelet -     CMP14+EGFR -     Thyroid  Panel With TSH  Hyperlipidemia associated with type 2 diabetes mellitus (HCC) -     Bayer DCA Hb A1c Waived -     CMP14+EGFR -     Thyroid  Panel With TSH  Hypertension associated with diabetes (HCC) -     Bayer DCA Hb A1c  Waived -     CBC with Differential/Platelet -     CMP14+EGFR -     Thyroid  Panel With TSH  Atrial fibrillation with rapid ventricular response (HCC) -     Bayer DCA Hb A1c Waived -     CBC with Differential/Platelet -     CMP14+EGFR -     Thyroid  Panel With TSH  Persistent atrial fibrillation (HCC) -     Bayer DCA Hb A1c Waived -     CBC with Differential/Platelet -     CMP14+EGFR -     Thyroid  Panel With TSH  Hypercoagulable state due to paroxysmal atrial fibrillation (HCC) -     CBC with Differential/Platelet  Implantable loop recorder present -     Bayer DCA Hb A1c Waived -  CBC with Differential/Platelet -     Thyroid  Panel With TSH  Depression, recurrent (HCC) -     DULoxetine  (CYMBALTA ) 60 MG capsule; Take 1 capsule (60 mg total) by mouth daily. -     CBC with Differential/Platelet -     CMP14+EGFR -     Thyroid  Panel With TSH  Chronic midline low back pain with bilateral sciatica -     HYDROcodone -acetaminophen  (NORCO) 7.5-325 MG tablet; Take 1 tablet by mouth every 6 (six) hours as needed for moderate pain (pain score 4-6). -     HYDROcodone -acetaminophen  (NORCO) 7.5-325 MG tablet; Take 1 tablet by mouth every 6 (six) hours as needed for moderate pain (pain score 4-6). -     HYDROcodone -acetaminophen  (NORCO) 7.5-325 MG tablet; Take 1 tablet by mouth every 6 (six) hours as needed for moderate pain (pain score 4-6). -     ToxASSURE Select 13 (MW), Urine -     DULoxetine  (CYMBALTA ) 60 MG capsule; Take 1 capsule (60 mg total) by mouth daily. -     CBC with Differential/Platelet -     CMP14+EGFR -     Ambulatory referral to Pain Clinic  Chronic hip pain, bilateral -     HYDROcodone -acetaminophen  (NORCO) 7.5-325 MG tablet; Take 1 tablet by mouth every 6 (six) hours as needed for moderate pain (pain score 4-6). -     HYDROcodone -acetaminophen  (NORCO) 7.5-325 MG tablet; Take 1 tablet by mouth every 6 (six) hours as needed for moderate pain (pain score 4-6). -      HYDROcodone -acetaminophen  (NORCO) 7.5-325 MG tablet; Take 1 tablet by mouth every 6 (six) hours as needed for moderate pain (pain score 4-6). -     ToxASSURE Select 13 (MW), Urine -     DULoxetine  (CYMBALTA ) 60 MG capsule; Take 1 capsule (60 mg total) by mouth daily. -     CBC with Differential/Platelet -     CMP14+EGFR -     Ambulatory referral to Pain Clinic  Controlled substance agreement signed -     HYDROcodone -acetaminophen  (NORCO) 7.5-325 MG tablet; Take 1 tablet by mouth every 6 (six) hours as needed for moderate pain (pain score 4-6). -     HYDROcodone -acetaminophen  (NORCO) 7.5-325 MG tablet; Take 1 tablet by mouth every 6 (six) hours as needed for moderate pain (pain score 4-6). -     HYDROcodone -acetaminophen  (NORCO) 7.5-325 MG tablet; Take 1 tablet by mouth every 6 (six) hours as needed for moderate pain (pain score 4-6). -     DULoxetine  (CYMBALTA ) 60 MG capsule; Take 1 capsule (60 mg total) by mouth daily. 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 Pain affects daily activities, requiring frequent rest. - Continue hydrocodone  for pain management - Refer to pain management for further evaluation and potential treatment options  Persistent atrial fibrillation with ILR Regular checks are performed twice a month. No significant episodes of arrhythmia reported recently. - Continue current management with regular defibrillator checks  Hypertension Managed with metoprolol , losartan , and Cardizem . Recent increase in losartan   dosage to 75 mg daily has resulted in improved blood pressure control, with recent readings around 150/155  mmHg. - Continue current antihypertensive regimen - Monitor blood pressure regularly at home  Type 2 diabetes mellitus Managed with metformin . No reported issues with increased hunger, thirst, or urination. Recent labs to be reviewed for A1c levels. - Order A1c test to assess diabetes control  Depression Managed with duloxetine , recently increased to 60 mg daily. Some improvement noted, but ongoing issues with chronic oral numbness and pain. - Continue duloxetine  at current dosage  Chronic oral numbness and pain, right side Resembling post-dental procedure numbness. Neurology evaluation has not identified a cause. Symptoms persist despite medication adjustments. - Follow up with neurology as scheduled  Constipation due to opioid use Associated with hydrocodone  use. Managed with dietary measures such as prunes and hot coffee, which are effective.  Hyperlipidemia Managed with Crestor . No reported side effects such as muscle aches or pains. - Continue Crestor  as prescribed    Continue all other maintenance medications.  Follow up plan: Return in about 3 months (around 01/16/2024) for chronic follow up.   Continue healthy lifestyle choices, including diet (rich in fruits, vegetables, and lean proteins, and low in salt and simple carbohydrates) and exercise (at least 30 minutes of moderate physical activity daily).  Educational handout given for A-Fib  The above assessment and management plan was discussed with the patient. The patient verbalized understanding of and has agreed to the management plan. Patient is aware to call the clinic if they develop any new symptoms or if symptoms persist or worsen. Patient is aware when to return to the clinic for a follow-up visit. Patient educated on when it is appropriate to go to the emergency department.   Julie Bruns, FNP-C [facility] [phone]

## 2023-10-17 LAB — CBC WITH DIFFERENTIAL/PLATELET
Basophils Absolute: 0 x10E3/uL (ref 0.0–0.2)
Basos: 1 %
EOS (ABSOLUTE): 0.3 x10E3/uL (ref 0.0–0.4)
Eos: 5 %
Hematocrit: 40.4 % (ref 34.0–46.6)
Hemoglobin: 13.1 g/dL (ref 11.1–15.9)
Immature Grans (Abs): 0 x10E3/uL (ref 0.0–0.1)
Immature Granulocytes: 0 %
Lymphocytes Absolute: 1.9 x10E3/uL (ref 0.7–3.1)
Lymphs: 38 %
MCH: 29.3 pg (ref 26.6–33.0)
MCHC: 32.4 g/dL (ref 31.5–35.7)
MCV: 90 fL (ref 79–97)
Monocytes Absolute: 0.5 x10E3/uL (ref 0.1–0.9)
Monocytes: 10 %
Neutrophils Absolute: 2.2 x10E3/uL (ref 1.4–7.0)
Neutrophils: 46 %
Platelets: 241 x10E3/uL (ref 150–450)
RBC: 4.47 x10E6/uL (ref 3.77–5.28)
RDW: 12.2 % (ref 11.7–15.4)
WBC: 4.8 x10E3/uL (ref 3.4–10.8)

## 2023-10-17 LAB — CMP14+EGFR
ALT: 10 IU/L (ref 0–32)
AST: 16 IU/L (ref 0–40)
Albumin: 4.5 g/dL (ref 3.7–4.7)
Alkaline Phosphatase: 86 IU/L (ref 44–121)
BUN/Creatinine Ratio: 14 (ref 12–28)
BUN: 12 mg/dL (ref 8–27)
Bilirubin Total: 0.3 mg/dL (ref 0.0–1.2)
CO2: 26 mmol/L (ref 20–29)
Calcium: 10.1 mg/dL (ref 8.7–10.3)
Chloride: 98 mmol/L (ref 96–106)
Creatinine, Ser: 0.86 mg/dL (ref 0.57–1.00)
Globulin, Total: 2.3 g/dL (ref 1.5–4.5)
Glucose: 96 mg/dL (ref 70–99)
Potassium: 4.7 mmol/L (ref 3.5–5.2)
Sodium: 137 mmol/L (ref 134–144)
Total Protein: 6.8 g/dL (ref 6.0–8.5)
eGFR: 67 mL/min/1.73 (ref >59)

## 2023-10-17 LAB — THYROID PANEL WITH TSH
Free Thyroxine Index: 2.4 (ref 1.2–4.9)
T3 Uptake Ratio: 27 (ref 24–39)
T4, Total: 8.8 ug/dL (ref 4.5–12.0)
TSH: 0.549 u[IU]/mL (ref 0.450–4.500)

## 2023-10-18 LAB — TOXASSURE SELECT 13 (MW), URINE

## 2023-10-25 ENCOUNTER — Other Ambulatory Visit: Payer: Self-pay | Admitting: *Deleted

## 2023-10-25 DIAGNOSIS — B351 Tinea unguium: Secondary | ICD-10-CM | POA: Diagnosis not present

## 2023-10-25 DIAGNOSIS — E1142 Type 2 diabetes mellitus with diabetic polyneuropathy: Secondary | ICD-10-CM | POA: Diagnosis not present

## 2023-10-25 DIAGNOSIS — L84 Corns and callosities: Secondary | ICD-10-CM | POA: Diagnosis not present

## 2023-10-25 DIAGNOSIS — M79675 Pain in left toe(s): Secondary | ICD-10-CM | POA: Diagnosis not present

## 2023-10-25 DIAGNOSIS — E1159 Type 2 diabetes mellitus with other circulatory complications: Secondary | ICD-10-CM

## 2023-10-25 DIAGNOSIS — M79674 Pain in right toe(s): Secondary | ICD-10-CM | POA: Diagnosis not present

## 2023-10-25 MED ORDER — METFORMIN HCL 500 MG PO TABS: 500.0000 mg | ORAL_TABLET | Freq: Two times a day (BID) | ORAL | 0 refills | Status: DC

## 2023-10-29 ENCOUNTER — Ambulatory Visit (INDEPENDENT_AMBULATORY_CARE_PROVIDER_SITE_OTHER)

## 2023-10-29 DIAGNOSIS — I4891 Unspecified atrial fibrillation: Secondary | ICD-10-CM | POA: Diagnosis not present

## 2023-10-29 LAB — CUP PACEART REMOTE DEVICE CHECK
Date Time Interrogation Session: 20250906231444
Implantable Pulse Generator Implant Date: 20220210

## 2023-11-08 ENCOUNTER — Encounter

## 2023-11-08 ENCOUNTER — Ambulatory Visit: Payer: Self-pay | Admitting: Cardiovascular Disease

## 2023-11-08 NOTE — Progress Notes (Signed)
 Remote Loop Recorder Transmission

## 2023-11-10 IMAGING — DX DG CHEST 1V PORT
1 series · 1 of 1 positions shown · non-contrast
Comparison: 03/08/2020

CLINICAL DATA: Shortness of breath with exertion with chest pain,
cough and vomiting since [REDACTED].

EXAM:
PORTABLE CHEST 1 VIEW

[chest ap]
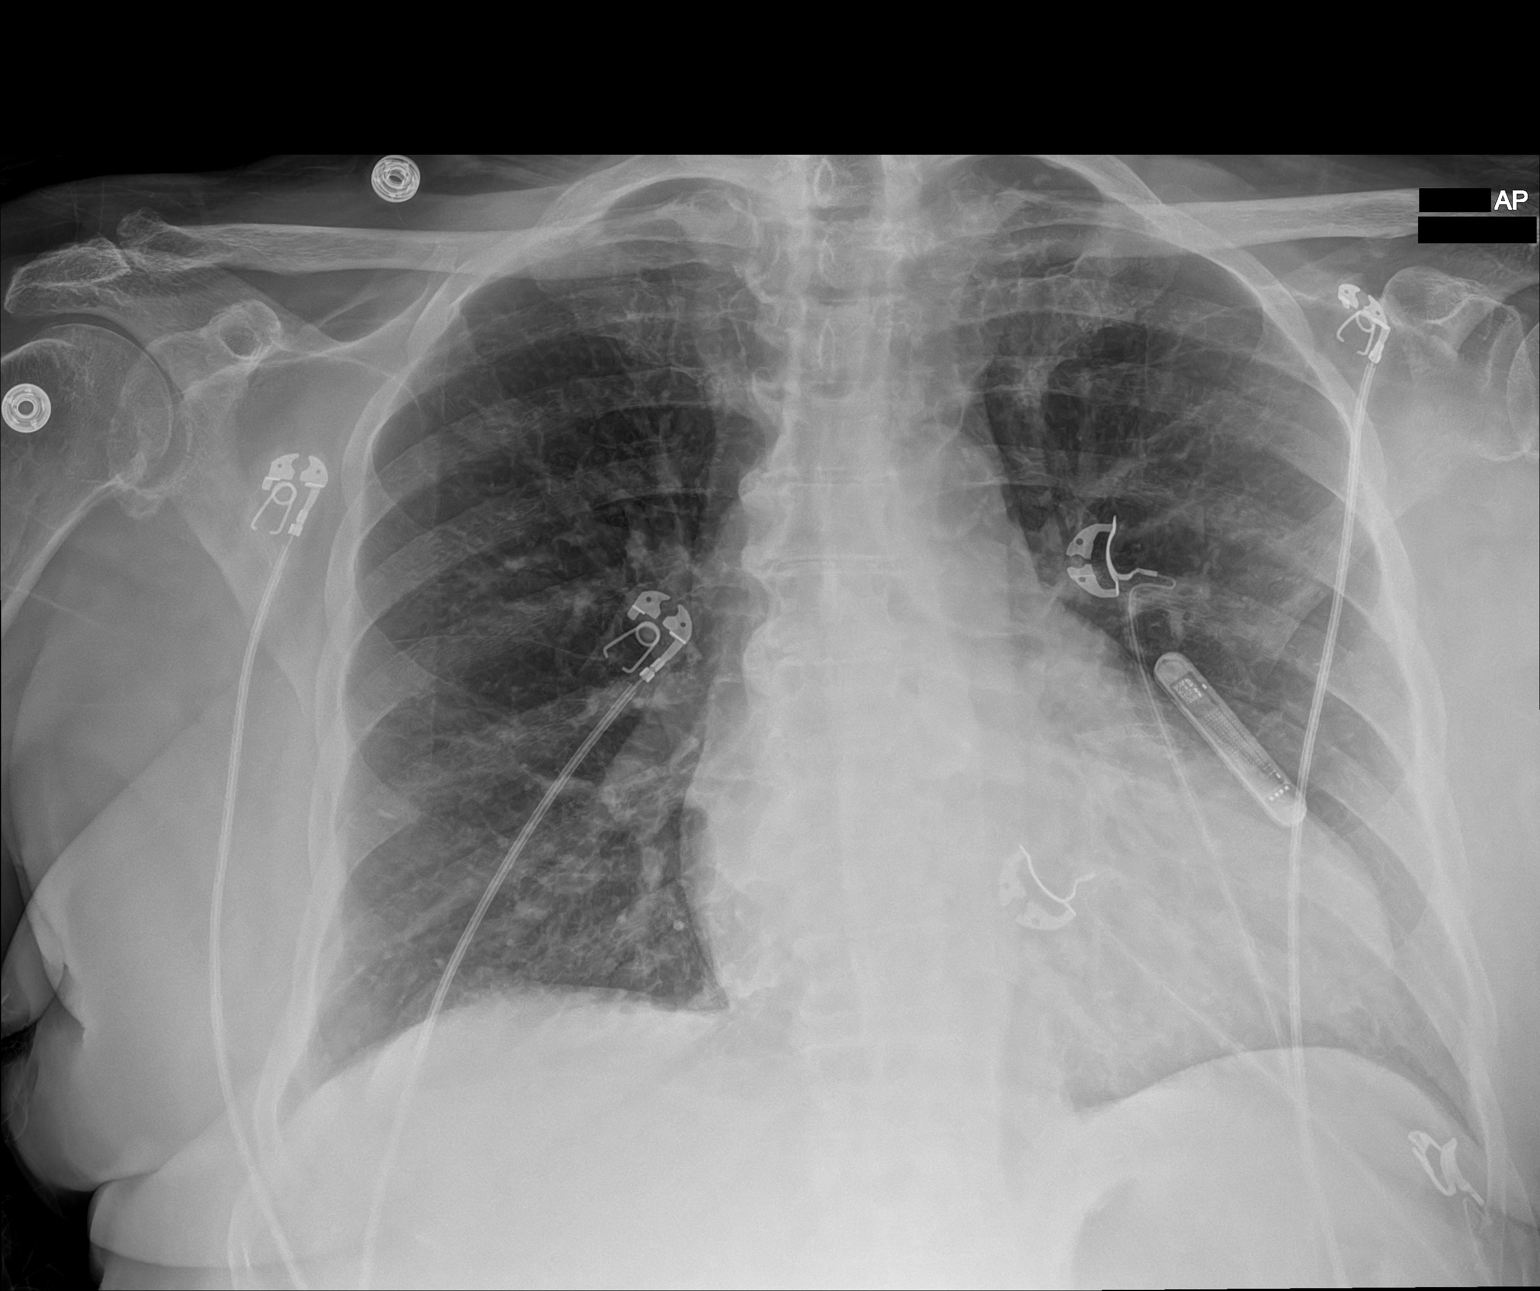

[1 of 1 positions shown; findings below may reference images not displayed]

FINDINGS: Loop recorder projects over the left chest. Lungs are adequately
inflated without focal airspace consolidation or effusion. Coronary
artery stent unchanged projecting over the left heart. Mild stable
cardiomegaly. Remainder of the exam is unchanged.
IMPRESSION: No active disease.

## 2023-11-13 ENCOUNTER — Telehealth: Payer: Self-pay

## 2023-11-13 NOTE — Patient Outreach (Signed)
 Complex Care Management Care Guide Note  11/13/2023 Name: [name] MRN: [id number] DOB: 1940/06/28  [name] is a 83 y.o. year old female who is a primary care patient of Rakes, Rock CHRISTELLA, FNP and is actively engaged with the care management team. I reached out to [name] by phone today to assist with re-scheduling  with the RN Case Manager.  Follow up plan: Telephone appointment with complex care management team member scheduled for:  12/13/23  Shereen [facility]  Population Health VBCI Assistant Direct Dial: [phone]  Fax: [phone] Website: [email or web address]

## 2023-11-17 NOTE — Progress Notes (Signed)
 Remote Loop Recorder Transmission

## 2023-11-23 ENCOUNTER — Telehealth: Payer: Self-pay | Admitting: Family Medicine

## 2023-11-23 NOTE — Telephone Encounter (Signed)
 Copied from CRM #[id number]. Topic: General - Other >> Nov 23, 2023  8:31 AM Diannia H wrote: Reason for CRM: Romea called from [employer] about the patients abnormal labs, could you return her call at [phone] Monday-Friday 9-6pm EST

## 2023-11-26 ENCOUNTER — Other Ambulatory Visit: Payer: Self-pay | Admitting: Cardiology

## 2023-11-26 ENCOUNTER — Other Ambulatory Visit: Payer: Self-pay | Admitting: *Deleted

## 2023-11-26 DIAGNOSIS — F5101 Primary insomnia: Secondary | ICD-10-CM

## 2023-11-27 MED ORDER — TRAZODONE HCL 150 MG PO TABS
150.0000 mg | ORAL_TABLET | Freq: Every day | ORAL | 0 refills | Status: DC
Start: 2023-11-27 — End: 2023-12-28

## 2023-11-28 MED ORDER — DILTIAZEM HCL ER COATED BEADS 300 MG PO CP24: 300.0000 mg | ORAL_CAPSULE | Freq: Every day | ORAL | 2 refills | Status: AC

## 2023-11-29 ENCOUNTER — Encounter

## 2023-11-29 ENCOUNTER — Ambulatory Visit (INDEPENDENT_AMBULATORY_CARE_PROVIDER_SITE_OTHER)

## 2023-11-29 DIAGNOSIS — I4891 Unspecified atrial fibrillation: Secondary | ICD-10-CM

## 2023-11-29 LAB — CUP PACEART REMOTE DEVICE CHECK
Date Time Interrogation Session: 20251008230812
Implantable Pulse Generator Implant Date: 20220210

## 2023-12-03 NOTE — Progress Notes (Signed)
 Remote Loop Recorder Transmission

## 2023-12-05 NOTE — Progress Notes (Signed)
 Remote Loop Recorder Transmission

## 2023-12-06 ENCOUNTER — Ambulatory Visit: Payer: Self-pay | Admitting: Cardiovascular Disease

## 2023-12-10 ENCOUNTER — Encounter

## 2023-12-11 ENCOUNTER — Other Ambulatory Visit: Payer: Self-pay

## 2023-12-11 ENCOUNTER — Emergency Department (HOSPITAL_COMMUNITY)
Admission: EM | Admit: 2023-12-11 | Discharge: 2023-12-11 | Disposition: A | Discharge status: Home / Self Care | Attending: Emergency Medicine | Admitting: Emergency Medicine

## 2023-12-11 ENCOUNTER — Emergency Department (HOSPITAL_COMMUNITY)

## 2023-12-11 ENCOUNTER — Encounter (HOSPITAL_COMMUNITY): Payer: Self-pay

## 2023-12-11 ENCOUNTER — Ambulatory Visit: Payer: Self-pay

## 2023-12-11 DIAGNOSIS — R002 Palpitations: Secondary | ICD-10-CM | POA: Diagnosis not present

## 2023-12-11 DIAGNOSIS — R079 Chest pain, unspecified: Secondary | ICD-10-CM | POA: Diagnosis not present

## 2023-12-11 DIAGNOSIS — R0602 Shortness of breath: Secondary | ICD-10-CM | POA: Insufficient documentation

## 2023-12-11 LAB — COMPREHENSIVE METABOLIC PANEL WITH GFR
ALT: 25 U/L (ref 0–44)
AST: 24 U/L (ref 15–41)
Albumin: 4.3 g/dL (ref 3.5–5.0)
Alkaline Phosphatase: 86 U/L (ref 38–126)
Anion gap: 9 (ref 5–15)
BUN: 12 mg/dL (ref 8–23)
CO2: 30 mmol/L (ref 22–32)
Calcium: 9.5 mg/dL (ref 8.9–10.3)
Chloride: 103 mmol/L (ref 98–111)
Creatinine, Ser: 0.87 mg/dL (ref 0.44–1.00)
GFR, Estimated: >60 mL/min (ref >60)
Glucose, Bld: 112 mg/dL — ABNORMAL HIGH (ref 70–99)
Potassium: 4.6 mmol/L (ref 3.5–5.1)
Sodium: 141 mmol/L (ref 135–145)
Total Bilirubin: 0.3 mg/dL (ref 0.0–1.2)
Total Protein: 6.5 g/dL (ref 6.5–8.1)

## 2023-12-11 LAB — URINALYSIS, ROUTINE W REFLEX MICROSCOPIC
Bilirubin Urine: NEGATIVE
Glucose, UA: NEGATIVE mg/dL
Hgb urine dipstick: NEGATIVE
Ketones, ur: NEGATIVE mg/dL
Leukocytes,Ua: NEGATIVE
Nitrite: NEGATIVE
Protein, ur: NEGATIVE mg/dL
Specific Gravity, Urine: 1.013 (ref 1.005–1.030)
pH: 7 (ref 5.0–8.0)

## 2023-12-11 LAB — CBC WITH DIFFERENTIAL/PLATELET
Abs Immature Granulocytes: 0 K/uL (ref 0.00–0.07)
Basophils Absolute: 0 K/uL (ref 0.0–0.1)
Basophils Relative: 1 %
Eosinophils Absolute: 0.2 K/uL (ref 0.0–0.5)
Eosinophils Relative: 5 %
HCT: 36.4 % (ref 36.0–46.0)
Hemoglobin: 11.9 g/dL — ABNORMAL LOW (ref 12.0–15.0)
Immature Granulocytes: 0 %
Lymphocytes Relative: 34 %
Lymphs Abs: 1.3 K/uL (ref 0.7–4.0)
MCH: 30.1 pg (ref 26.0–34.0)
MCHC: 32.7 g/dL (ref 30.0–36.0)
MCV: 92.2 fL (ref 80.0–100.0)
Monocytes Absolute: 0.3 K/uL (ref 0.1–1.0)
Monocytes Relative: 8 %
Neutro Abs: 1.9 K/uL (ref 1.7–7.7)
Neutrophils Relative %: 52 %
Platelets: 196 K/uL (ref 150–400)
RBC: 3.95 MIL/uL (ref 3.87–5.11)
RDW: 12.3 % (ref 11.5–15.5)
WBC: 3.7 K/uL — ABNORMAL LOW (ref 4.0–10.5)
nRBC: 0 % (ref 0.0–0.2)

## 2023-12-11 LAB — TROPONIN T, HIGH SENSITIVITY: Troponin T High Sensitivity: <15 ng/L (ref 0–19)

## 2023-12-11 LAB — PRO BRAIN NATRIURETIC PEPTIDE: Pro Brain Natriuretic Peptide: 2634 pg/mL — ABNORMAL HIGH (ref <300.0)

## 2023-12-11 MED ORDER — DILTIAZEM HCL 30 MG PO TABS
60.0000 mg | ORAL_TABLET | Freq: Once | ORAL | Status: AC
  Administered 2023-12-11: 60 mg via ORAL
  Filled 2023-12-11: qty 2

## 2023-12-11 MED ORDER — DILTIAZEM HCL 60 MG PO TABS: ORAL_TABLET | ORAL | 1 refills | Status: AC

## 2023-12-11 NOTE — ED Triage Notes (Signed)
 Pt arrived via POV from home c/o persistent chest pain and reports being advised to seek evaluation in the ER due to the Pt having SOB, N/V, fatigue over the past few days. Pt does report Eliquis  compliance and reports Hx of A-Fib. Pt reports she is not experiencing pain, just some tightness.

## 2023-12-11 NOTE — Telephone Encounter (Signed)
 Patient was advised to go to ED. Patient said she would call one of her daughters to see if they can take her.

## 2023-12-11 NOTE — ED Provider Notes (Signed)
 [place] EMERGENCY DEPARTMENT AT [facility] Provider Note   CSN: [id number] Arrival date & time: 12/11/23  1251     Patient presents with: Chest Pain   [name] is a 83 y.o. female.   Patient has a history of atrial fibs.  She is followed by cardiology and takes Cardizem  CD and metoprolol  100 mg XL.  She has been having some increased palpitations.  She has no symptoms now  The history is provided by the patient and medical records. No language interpreter was used.  Palpitations Palpitations quality:  Irregular Onset quality:  Sudden Timing:  Intermittent Progression:  Waxing and waning Chronicity:  Recurrent Context: not anxiety   Relieved by:  Nothing Worsened by:  Nothing Associated symptoms: no back pain, no chest pain and no cough        Prior to Admission medications   Medication Sig Start Date End Date Taking? Authorizing Provider  acetaminophen  (TYLENOL ) 500 MG tablet Take 1,000 mg by mouth every 6 (six) hours as needed for moderate pain.   Yes [provider]  Ascorbic Acid  (VITAMIN C ) 500 MG tablet Take 500 mg by mouth daily.     Yes [provider]  bimatoprost (LUMIGAN) 0.01 % SOLN Place 1 drop into both eyes at bedtime.   Yes [provider]  Calcium  Carbonate Antacid (TUMS PO) Take 2 tablets by mouth 4 (four) times daily as needed (for acid reflux/indigestion).    Yes [provider]  Camphor-Menthol -Methyl Sal (SALONPAS ) 3.02-26-08 % PTCH Apply 1 patch topically daily as needed (pain).   Yes [provider]  cetirizine (ZYRTEC) 10 MG tablet Take 10 mg by mouth daily as needed for allergies.   Yes [provider]  Cyanocobalamin  (VITAMIN B 12 PO) Take 1 tablet by mouth daily.   Yes [provider]  diclofenac  Sodium (VOLTAREN ) 1 % GEL Apply 2 g topically 4 (four) times daily. 01/09/20  Yes Ijaola, Onyeje M, NP  diltiazem  (CARDIZEM  CD) 300 MG 24 hr capsule Take 1 capsule (300 mg  total) by mouth daily. 11/28/23  Yes [name], MD  diltiazem  (CARDIZEM ) 60 MG tablet Take 1 every 8 hours as needed for fast heart rate and palpitations 12/11/23  Yes Joshue Badal, MD  DULoxetine  (CYMBALTA ) 60 MG capsule Take 1 capsule (60 mg total) by mouth daily. 10/16/23  Yes RakesRock CHRISTELLA, FNP  ELIQUIS  5 MG TABS tablet TAKE ONE TABLET BY MOUTH TWICE DAILY 08/28/23  Yes [name], MD  GARLIC  PO Take 1 tablet by mouth daily.   Yes [provider]  HYDROcodone -acetaminophen  (NORCO) 7.5-325 MG tablet Take 1 tablet by mouth every 6 (six) hours as needed for moderate pain (pain score 4-6). 11/15/23  Yes Rakes, Rock CHRISTELLA, FNP  levothyroxine  (SYNTHROID ) 25 MCG tablet Take 1 tablet (25 mcg total) by mouth daily before breakfast. 04/10/23  Yes Reardon, Benton PARAS, NP  lidocaine  (HM LIDOCAINE  PATCH) 4 % Place 2 patches onto the skin daily. 07/13/23  Yes Milian, Marry Lenis, FNP  losartan  (COZAAR ) 50 MG tablet Take 1.5 tablets (75 mg total) by mouth daily. 09/19/23  Yes [name], MD  MAGNESIUM  CARBONATE PO Take 1 tablet by mouth at bedtime.   Yes [provider]  metFORMIN  (GLUCOPHAGE ) 500 MG tablet Take 1 tablet (500 mg total) by mouth 2 (two) times daily with a meal. 10/25/23  Yes Rakes, Rock CHRISTELLA, FNP  metoprolol  succinate (TOPROL -XL) 100 MG 24 hr tablet TAKE 1  TABLET BY MOUTH DAILY WITH FOOD 08/03/23  Yes [name], MD  nitroGLYCERIN  (NITROSTAT ) 0.4 MG SL tablet DISSOLVE 1 TAB UNDER TOUNGE FOR CHEST PAIN. MAY REPEAT EVERY 5 MINUTES FOR 3 DOSES. IF NO RELIEF CALL 911 OR GO TO ER 07/03/22  Yes [name], MD  rosuvastatin  (CRESTOR ) 20 MG tablet TAKE ONE (1) TABLET BY MOUTH EVERY DAY 09/28/23  Yes [name], MD  timolol  (TIMOPTIC ) 0.5 % ophthalmic solution Place 1 drop into both eyes daily.   Yes [provider]  traZODone  (DESYREL ) 150 MG tablet Take 1 tablet (150 mg total) by mouth at bedtime. 11/27/23  Yes Rakes, Rock HERO, FNP  TURMERIC  PO Take 1 tablet by mouth daily.   Yes [provider]  VENTOLIN  HFA 108 (90 Base) MCG/ACT inhaler Inhale 1-2 puffs into the lungs every 4 (four) hours as needed for shortness of breath. 01/12/21  Yes Emokpae, Courage, MD  Vitamin D , Cholecalciferol , 10 MCG (400 UNIT) CAPS Take 400 Units by mouth daily.   Yes [provider]  HYDROcodone -acetaminophen  (NORCO) 7.5-325 MG tablet Take 1 tablet by mouth every 6 (six) hours as needed for moderate pain (pain score 4-6). Patient not taking: Reported on 12/11/2023 10/16/23   Severa Rock HERO, FNP  HYDROcodone -acetaminophen  (NORCO) 7.5-325 MG tablet Take 1 tablet by mouth every 6 (six) hours as needed for moderate pain (pain score 4-6). Patient not taking: Reported on 12/11/2023 12/15/23   Severa Rock HERO, FNP  [facility] syringe  01/10/23   [provider]    Allergies: Patient has no known allergies.    Review of Systems  Constitutional:  Negative for appetite change and fatigue.  HENT:  Negative for congestion, ear discharge and sinus pressure.   Eyes:  Negative for discharge.  Respiratory:  Negative for cough.   Cardiovascular:  Positive for palpitations. Negative for chest pain.  Gastrointestinal:  Negative for abdominal pain and diarrhea.  Genitourinary:  Negative for frequency and hematuria.  Musculoskeletal:  Negative for back pain.  Skin:  Negative for rash.  Neurological:  Negative for seizures and headaches.  Psychiatric/Behavioral:  Negative for hallucinations.     Updated Vital Signs BP 139/83 (BP Location: Right Arm)   Pulse (!) 103   Temp 98.1 F (36.7 C) (Oral)   Resp 12   Ht 5' 4 (1.626 m)   Wt 63.5 kg   LMP  (LMP Unknown)   SpO2 100%   BMI 24.03 kg/m   Physical Exam Vitals and nursing note reviewed.  Constitutional:      Appearance: She is well-developed.  HENT:     Head: Normocephalic.     Nose: Nose normal.  Eyes:     General: No scleral icterus.    Conjunctiva/sclera: Conjunctivae  normal.  Neck:     Thyroid : No thyromegaly.  Cardiovascular:     Rate and Rhythm: Regular rhythm. Tachycardia present.     Heart sounds: No murmur heard.    No friction rub. No gallop.  Pulmonary:     Breath sounds: No stridor. No wheezing or rales.  Chest:     Chest Matthews: No tenderness.  Abdominal:     General: There is no distension.     Tenderness: There is no abdominal tenderness. There is no rebound.  Musculoskeletal:        General: Normal range of motion.     Cervical back: Neck supple.  Lymphadenopathy:     Cervical: No cervical adenopathy.  Skin:  Findings: No erythema or rash.  Neurological:     Mental Status: She is alert and oriented to person, place, and time.     Motor: No abnormal muscle tone.     Coordination: Coordination normal.  Psychiatric:        Behavior: Behavior normal.     (all labs ordered are listed, but only abnormal results are displayed) Labs Reviewed  CBC WITH DIFFERENTIAL/PLATELET - Abnormal; Notable for the following components:      Result Value   WBC 3.7 (*)    Hemoglobin 11.9 (*)    All other components within normal limits  COMPREHENSIVE METABOLIC PANEL WITH GFR - Abnormal; Notable for the following components:   Glucose, Bld 112 (*)    All other components within normal limits  PRO BRAIN NATRIURETIC PEPTIDE - Abnormal; Notable for the following components:   Pro Brain Natriuretic Peptide 2,634.0 (*)    All other components within normal limits  URINALYSIS, ROUTINE W REFLEX MICROSCOPIC - Abnormal; Notable for the following components:   APPearance HAZY (*)    All other components within normal limits  TROPONIN T, HIGH SENSITIVITY    EKG: EKG Interpretation Date/Time:  Tuesday December 11 2023 13:06:36 EDT Ventricular Rate:  103 PR Interval:  126 QRS Duration:  78 QT Interval:  350 QTC Calculation: 458 R Axis:   81  Text Interpretation: Sinus tachycardia Otherwise normal ECG When compared with ECG of 07-Sep-2023 13:13,  Premature supraventricular complexes are no longer Present Vent. rate has increased BY  41 BPM Confirmed by Suzette Pac [phone]) on 12/11/2023 1:36:51 PM  Radiology: DG Chest 2 View Result Date: 12/11/2023 CLINICAL DATA:  Chest pain, short of breath EXAM: CHEST - 2 VIEW COMPARISON:  12/14/2021 FINDINGS: Frontal and lateral views of the chest demonstrate a stable cardiac silhouette. Loop recorder left anterior chest. No acute airspace disease, effusion, or pneumothorax. No acute bony abnormalities. IMPRESSION: 1. No acute intrathoracic process. Electronically Signed   By: Ozell Daring M.D.   On: 12/11/2023 14:54     Procedures   Medications Ordered in the ED  diltiazem  (CARDIZEM ) tablet 60 mg (60 mg Oral Given 12/11/23 1525)   Labs and x-rays and EKGs unremarkable.  I spoke with cardiology and they recommended the patient take as needed Cardizem  60 mg tablets for the palpitations                                 Medical Decision Making Amount and/or Complexity of Data Reviewed Labs: ordered. Radiology: ordered.  Risk Prescription drug management.   Palpitations with atrial fibs.  Labs unremarkable.  Patient will add Cardizem  60 mg tablets to take every 8 hours as needed for palpitations.  She has follow back up with her cardiologist     Final diagnoses:  Palpitations    ED Discharge Orders          Ordered    diltiazem  (CARDIZEM ) 60 MG tablet        12/11/23 1546               Suzette Pac, MD 12/11/23 1550

## 2023-12-11 NOTE — Telephone Encounter (Signed)
 FYI Only or Action Required?: Action required by provider: update on patient condition. Dispo ED- CP  Patient was last seen in primary care on 10/16/2023 by Severa Rock HERO, FNP.  Called Nurse Triage reporting Palpitations.  Symptoms began several days ago.  Interventions attempted: Prescription medications: Nitroglycerin .  Symptoms are: gradually worsening.  Triage Disposition: Go to ED Now (Notify PCP)  Patient/caregiver understands and will follow disposition?: Yes  Copied from CRM [id number]. Topic: Clinical - Red Word Triage >> Dec 11, 2023 11:18 AM Wess RAMAN wrote: Red Word that prompted transfer to Nurse Triage: Patient's daughter, Neldon, stated patient has Afib issues. Patient feels nauseous, vomiting, fatigue, shortness of breath. Reason for Disposition  [1] Chest pain (or angina) comes and goes AND [2] is happening more often (increasing in frequency) or getting worse (increasing in severity)  (Exception: Chest pains that last only a few seconds.)  Answer Assessment - Initial Assessment Questions Daughter calling to report that patient has had worsening Afib episodes with SOB, CP, increased fatigue, and N/V. Daughter not with patient at the time. Asks me to reach out to patient to ensure accurate information.   AFIB has been acting up the last few days. Last night she felt like her heart was racing and vibrating out of her chest. She took a Nitroglycerin  and it resolved. Denies ASA use. Reports Eliquis  compliance. She has been easily fatigued and breathing harder. Having to stop and catch her breath intermittently .N & V comes on suddenly. She can sometimes take a Zofran  and stop it but will end up vomiting.  Denies any missed doses of Eliquis . Next cardiology FU in [facility] November. Pt aware that it to far out to be evaluated. Advised ED. Pt will call to her daughters to see if one can take her to the ED and reach back out. Not actively having chest pain and shortness of breath but  worsening each day. 4-76minutes in length. Intermittent Left arm and neck pain. Advised EMS if daughter unable to take to ED. Pt understands 1. LOCATION: Where does it hurt?       Left chest  2. RADIATION: Does the pain go anywhere else? (e.g., into neck, jaw, arms, back)     Sometimes to the center of the chest to her neck, Left arm hurts sometimes. But Right arm does hurt sometimes as well  3. ONSET: When did the chest pain begin? (Minutes, hours or days)      Starting the beginning of last week  4. PATTERN: Does the pain come and go, or has it been constant since it started?  Does it get worse with exertion?      Comes and goes 5. DURATION: How long does it last (e.g., seconds, minutes, hours)     Couple minutes (4-84min) 6. SEVERITY: How bad is the pain?  (e.g., Scale 1-10; mild, moderate, or severe)     7-8/10 7. CARDIAC RISK FACTORS: Do you have any history of heart problems or risk factors for heart disease? (e.g., angina, prior heart attack; diabetes, high blood pressure, high cholesterol, smoker, or strong family history of heart disease)     MI, CAD, HTN, AFib 8. PULMONARY RISK FACTORS: Do you have any history of lung disease?  (e.g., blood clots in lung, asthma, emphysema, birth control pills)     Has inhaler  9. CAUSE: What do you think is causing the chest pain?     AFIB  10. OTHER SYMPTOMS: Do you have any other symptoms? (e.g.,  dizziness, nausea, vomiting, sweating, fever, difficulty breathing, cough)       Nauseous, vomiting, fatigue, SOB  Protocols used: Chest Pain-A-AH

## 2023-12-11 NOTE — Discharge Instructions (Signed)
Follow-up with your cardiologist as planned 

## 2023-12-12 IMAGING — CT CT HEART MORPH/PULM VEIN W/ CM & W/O CA SCORE
1 series · 4 of 6 positions shown, 5 images · non-contrast
Comparison: 11/01/2012

Addendum:
CLINICAL DATA: Atrial fibrillation scheduled for an ablation.

EXAM:
Cardiac CT/CTA
TECHNIQUE: The patient was scanned on a Siemens Somatom scanner.

[Series 925: pulmonary veins · 0.43mm/px · 4 of 6 slices shown, 5 images]
[im 2/6  vessel]
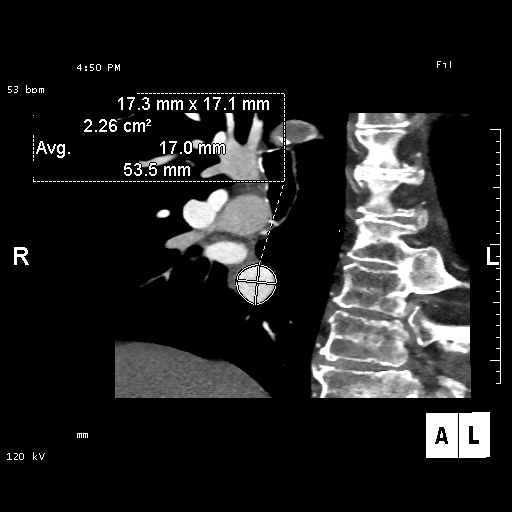
[im 2/6  lung]
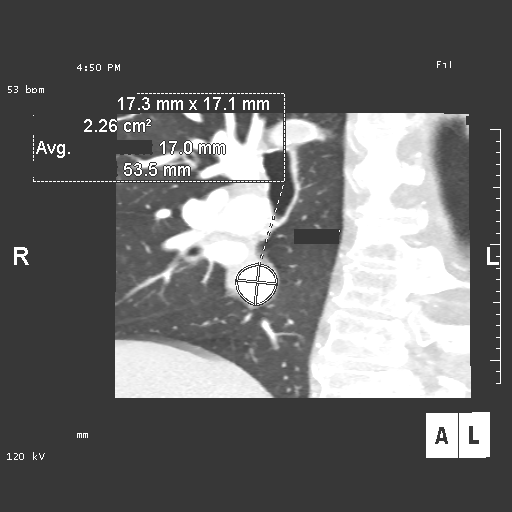
[im 3/6  vessel]
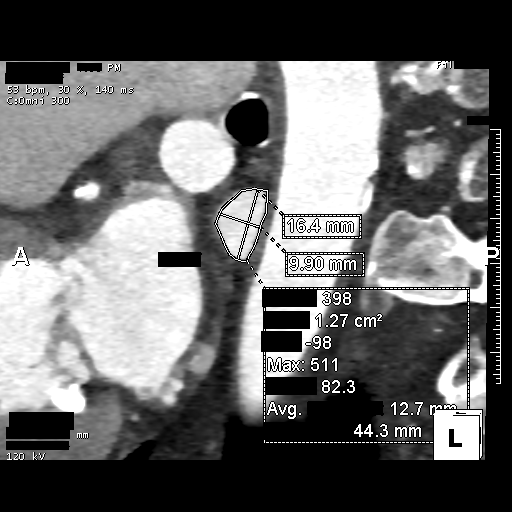
[im 4/6  vessel]
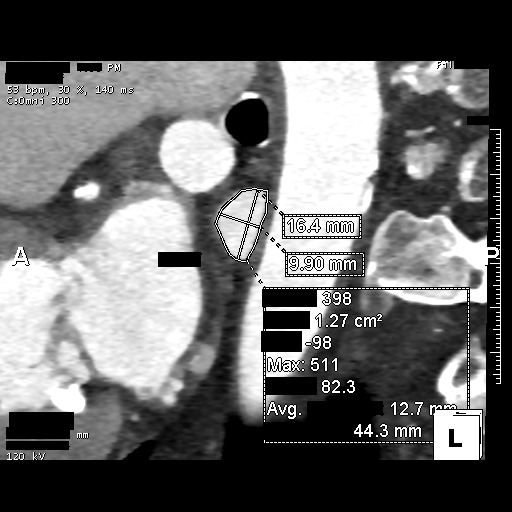
[im 5/6  vessel]
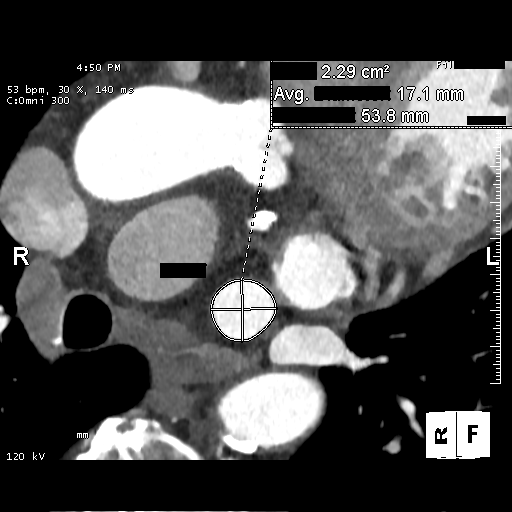

[4 of 6 positions shown; findings below may reference images not displayed]

FINDINGS: A 120 kV prospective scan was triggered in the descending thoracic
aorta at 111 HU's. Gantry rotation speed was 280 msecs and
collimation was .9 mm. No beta blockade and no NTG was given. The 3D
data set was reconstructed in 5% intervals of the 60-80 % of the R-R
cycle. Diastolic phases were analyzed on a dedicated work station
using MPR, MIP and VRT modes. The patient received 80 cc of
contrast.

Left atrium: Mild enlargement. No thrombus in the left atrium or
left atrial appendage.

Left ventricle: Normal in size.

Right atrium: Mild enlargement

Right ventricle:  Mild enlargement

Pericardium: Normal thickness

Pulmonary veins:

Left superior pulmonary vein: Measures 1.8cm x 1.7cm in diameter
with area 2.3cm^2.

Left inferior pulmonary vein: Measures 1.6cm x 1.0cm in diameter
with area 1.3cm^2.

Right superior pulmonary vein: Measures 2.2cm x 1.8cm in diameter
with area 3.3cm^2.

Right inferior pulmonary vein: Measures 1.7cm x 1.7cm in diameter
with area 2.3cm^2.

Pulmonary arteries: Normal size

Coronary Arteries: Normal coronary origin. Right dominance. The
study was performed without use of NTG and insufficient for plaque
evaluation. Stents in LAD, Ramus, and RCA

Esophagus: Courses posterior to the body of the left atrium

Valves: Moderate mitral annular calcification
IMPRESSION: 1. There is normal pulmonary vein drainage into the left atrium.
Measurements as reported

2. There is no thrombus in the left atrial appendage.

3. The esophagus runs in the left atrial midline and is not in the
proximity to any of the pulmonary veins.

EXAM:
OVER-READ INTERPRETATION  CT CHEST

The following report is an over-read performed by radiologist Dr.
Mario Estuardo Jacaltenango [REDACTED] on 02/11/2021. This over-read
does not include interpretation of cardiac or coronary anatomy or
pathology. The coronary CTA interpretation by the cardiologist is
attached.
FINDINGS: Heart is normal size. Moderate coronary artery calcifications. No
adenopathy. No confluent airspace opacities in the visualized lungs.
No effusions. Imaging into the upper abdomen demonstrates no acute
findings. Chest wall soft tissues are unremarkable. No acute bony
abnormality.
IMPRESSION: Moderate aortic calcifications.  No aneurysm.

No acute extra cardiac abnormality.

*** End of Addendum ***
FINDINGS: A 120 kV prospective scan was triggered in the descending thoracic
aorta at 111 HU's. Gantry rotation speed was 280 msecs and
collimation was .9 mm. No beta blockade and no NTG was given. The 3D
data set was reconstructed in 5% intervals of the 60-80 % of the R-R
cycle. Diastolic phases were analyzed on a dedicated work station
using MPR, MIP and VRT modes. The patient received 80 cc of
contrast.

Left atrium: Mild enlargement. No thrombus in the left atrium or
left atrial appendage.

Left ventricle: Normal in size.

Right atrium: Mild enlargement

Right ventricle:  Mild enlargement

Pericardium: Normal thickness

Pulmonary veins:

Left superior pulmonary vein: Measures 1.8cm x 1.7cm in diameter
with area 2.3cm^2.

Left inferior pulmonary vein: Measures 1.6cm x 1.0cm in diameter
with area 1.3cm^2.

Right superior pulmonary vein: Measures 2.2cm x 1.8cm in diameter
with area 3.3cm^2.

Right inferior pulmonary vein: Measures 1.7cm x 1.7cm in diameter
with area 2.3cm^2.

Pulmonary arteries: Normal size

Coronary Arteries: Normal coronary origin. Right dominance. The
study was performed without use of NTG and insufficient for plaque
evaluation. Stents in LAD, Ramus, and RCA

Esophagus: Courses posterior to the body of the left atrium

Valves: Moderate mitral annular calcification
IMPRESSION: 1. There is normal pulmonary vein drainage into the left atrium.
Measurements as reported

2. There is no thrombus in the left atrial appendage.

3. The esophagus runs in the left atrial midline and is not in the
proximity to any of the pulmonary veins.

## 2023-12-13 ENCOUNTER — Other Ambulatory Visit: Payer: Self-pay | Admitting: *Deleted

## 2023-12-13 ENCOUNTER — Telehealth: Payer: Self-pay | Admitting: *Deleted

## 2023-12-13 DIAGNOSIS — I4719 Other supraventricular tachycardia: Secondary | ICD-10-CM

## 2023-12-13 DIAGNOSIS — I4891 Unspecified atrial fibrillation: Secondary | ICD-10-CM

## 2023-12-13 DIAGNOSIS — I25119 Atherosclerotic heart disease of native coronary artery with unspecified angina pectoris: Secondary | ICD-10-CM

## 2023-12-13 NOTE — Patient Outreach (Signed)
 Complex Care Management   Visit Note  12/13/2023  Name:  MAURY BAMBA MRN: [id number] DOB: 15-Dec-1940  Situation: Referral received for Complex Care Management related to Atrial Fibrillation and HTN I obtained verbal consent from Patient.  Visit completed with Patient  on the phone  Background:   Past Medical History:  Diagnosis Date   Anxiety    Arthritis    Bulging lumbar disc    Chronic lower back pain    Coronary atherosclerosis of native coronary artery    DES LAD 02/2008, DES LAD 02/2015, DES ramus and DES x2 mid RCA 02/2020   Depression    Dyslipidemia    Essential hypertension    Facial numbness    Frequent headaches    GERD (gastroesophageal reflux disease)    Glaucoma    Heart attack (HCC) 02/2020   Insomnia    Paroxysmal atrial fibrillation (HCC)    Diagnosed October 2019   PSVT (paroxysmal supraventricular tachycardia)    Refusal of blood transfusions as patient is Jehovah's Witness    Thyroid  disease    Type 2 diabetes mellitus (HCC)    Borderline   Vitamin D  deficiency     Assessment: Patient Reported Symptoms:  Cognitive Cognitive Status: No symptoms reported Cognitive/Intellectual Conditions Management [RPT]: None reported or documented in medical history or problem list   Health Maintenance Behaviors: Annual physical exam Healing Pattern: Average Health Facilitated by: Rest  Neurological Neurological Review of Symptoms: No symptoms reported    HEENT HEENT Symptoms Reported: No symptoms reported      Cardiovascular Cardiovascular Symptoms Reported: Fatigue, Palpitations, Irregular pulse, Other: Other Cardiovascular Symptoms: chest heaviness Does patient have uncontrolled Hypertension?: No Cardiovascular Self-Management Outcome: 3 (uncertain)  Respiratory Respiratory Symptoms Reported: No symptoms reported    Endocrine Endocrine Symptoms Reported: No symptoms reported Is patient diabetic?: Yes Is patient checking blood sugars at home?: No     Gastrointestinal Gastrointestinal Symptoms Reported: Nausea   Nutrition Risk Screen (CP): Difficulty chewing/swallowing  Genitourinary Genitourinary Symptoms Reported: No symptoms reported    Integumentary Integumentary Symptoms Reported: No symptoms reported    Musculoskeletal Musculoskelatal Symptoms Reviewed: Back pain Musculoskeletal Management Strategies: Routine screening Falls in the past year?: Yes Number of falls in past year: 1 or less Was there an injury with Fall?: No Fall Risk Category Calculator: 1 Patient Fall Risk Level: Low Fall Risk Patient at Risk for Falls Due to: No Fall Risks Fall risk Follow up: Falls evaluation completed  Psychosocial Psychosocial Symptoms Reported: Alteration in eating habits Behavioral Management Strategies: Coping strategies Behavioral Health Self-Management Outcome: 3 (uncertain) Major Change/Loss/Stressor/Fears (CP): Denies Techniques to Cope with Loss/Stress/Change: Diversional activities Quality of Family Relationships: supportive, involved, helpful Do you feel physically threatened by others?: No    12/13/2023    PHQ2-9 Depression Screening   Little interest or pleasure in doing things Not at all  Feeling down, depressed, or hopeless Not at all  PHQ-2 - Total Score 0  Trouble falling or staying asleep, or sleeping too much    Feeling tired or having little energy    Poor appetite or overeating     Feeling bad about yourself - or that you are a failure or have let yourself or your family down    Trouble concentrating on things, such as reading the newspaper or watching television    Moving or speaking so slowly that other people could have noticed.  Or the opposite - being so fidgety or restless that you have been moving around  a lot more than usual    Thoughts that you would be better off dead, or hurting yourself in some way    PHQ2-9 Total Score    If you checked off any problems, how difficult have these problems made it for  you to do your work, take care of things at home, or get along with other people    Depression Interventions/Treatment      There were no vitals filed for this visit.  Medications Reviewed Today     Reviewed by Bertrum Rosina HERO, RN (Registered Nurse) on 12/13/23 at 1046  Med List Status: <None>   Medication Order Taking? Sig Documenting Provider Last Dose Status Informant  acetaminophen  (TYLENOL ) 500 MG tablet [id number] No Take 1,000 mg by mouth every 6 (six) hours as needed for moderate pain. [provider] Unknown Active Self, Pharmacy Records  Ascorbic Acid  (VITAMIN C ) 500 MG tablet [id number] No Take 500 mg by mouth daily.   [provider] 12/11/2023 Morning Active Self, Pharmacy Records  bimatoprost (LUMIGAN) 0.01 % SOLN [id number] No Place 1 drop into both eyes at bedtime. [provider] 12/10/2023 Bedtime Active Self, Pharmacy Records  Calcium  Carbonate Antacid (TUMS PO) [id number] No Take 2 tablets by mouth 4 (four) times daily as needed (for acid reflux/indigestion).  [provider] Unknown Active Self, Pharmacy Records  Camphor-Menthol -Methyl Sal (SALONPAS ) 3.02-26-08 % PTCH [id number] No Apply 1 patch topically daily as needed (pain). [provider] Unknown Active Self, Pharmacy Records  cetirizine (ZYRTEC) 10 MG tablet [id number] No Take 10 mg by mouth daily as needed for allergies. [provider] Unknown Active Self, Pharmacy Records  Cyanocobalamin  (VITAMIN B 12 PO) [id number] No Take 1 tablet by mouth daily. [provider] 12/11/2023 Morning Active Self, Pharmacy Records  diclofenac  Sodium (VOLTAREN ) 1 % GEL [id number] No Apply 2 g topically 4 (four) times daily. Cherylene Homer HERO, NP Unknown Active Self, Pharmacy Records  diltiazem  (CARDIZEM  CD) 300 MG 24 hr capsule [id number] No Take 1 capsule (300 mg total) by mouth daily. [name], MD 12/11/2023 Morning Active Self, Pharmacy Records  diltiazem  (CARDIZEM ) 60 MG  tablet [id number]  Take 1 every 8 hours as needed for fast heart rate and palpitations Suzette Pac, MD  Active   DULoxetine  (CYMBALTA ) 60 MG capsule [id number] No Take 1 capsule (60 mg total) by mouth daily. Severa Rock HERO, FNP 12/11/2023 Morning Active Self, Pharmacy Records  ELIQUIS  5 MG TABS tablet [id number] No TAKE ONE TABLET BY MOUTH TWICE DAILY [name], MD 12/11/2023 10:00 AM Active Self, Pharmacy Records  GARLIC  PO [id number] No Take 1 tablet by mouth daily. [provider] 12/10/2023 Evening Active Self, Pharmacy Records  HYDROcodone -acetaminophen  (NORCO) 7.5-325 MG tablet [id number] No Take 1 tablet by mouth every 6 (six) hours as needed for moderate pain (pain score 4-6).  Patient not taking: Reported on 12/11/2023   Severa Rock HERO, FNP Not Taking Active Self, Pharmacy Records  HYDROcodone -acetaminophen  [facility]) 7.5-325 MG tablet [id number] No Take 1 tablet by mouth every 6 (six) hours as needed for moderate pain (pain score 4-6). Severa Rock HERO, FNP Past Week Active Self, Pharmacy Records  HYDROcodone -acetaminophen  (NORCO) 7.5-325 MG tablet [id number] No Take 1 tablet by mouth every 6 (six) hours as needed for moderate pain (pain score 4-6).  Patient not taking: Reported on 12/11/2023   Severa Rock HERO, FNP Not Taking Active Self, Pharmacy Records  levothyroxine  (SYNTHROID ) 25 MCG tablet [id number] No Take 1 tablet (25 mcg total) by  mouth daily before breakfast. [name], NP 12/11/2023 Morning Active Self, Pharmacy Records  lidocaine  (HM LIDOCAINE  PATCH) 4 % [id number] No Place 2 patches onto the skin daily. Cathlene Marry Lenis, FNP Unknown Active Self, Pharmacy Records  losartan  (COZAAR ) 50 MG tablet [id number] No Take 1.5 tablets (75 mg total) by mouth daily. [name], MD 12/11/2023 Morning Active Self, Pharmacy Records  MAGNESIUM  CARBONATE PO [id number] No Take 1 tablet by mouth at bedtime. [provider] 12/10/2023 Bedtime Active Self, Pharmacy  Records  metFORMIN  (GLUCOPHAGE ) 500 MG tablet [id number] No Take 1 tablet (500 mg total) by mouth 2 (two) times daily with a meal. Severa Rock HERO, FNP 12/11/2023 Morning Active Self, Pharmacy Records  metoprolol  succinate (TOPROL -XL) 100 MG 24 hr tablet [id number] No TAKE 1 TABLET BY MOUTH DAILY WITH FOOD [name], MD 12/11/2023 Morning Active Self, Pharmacy Records  nitroGLYCERIN  (NITROSTAT ) 0.4 MG SL tablet [id number] No DISSOLVE 1 TAB UNDER TOUNGE FOR CHEST PAIN. MAY REPEAT EVERY 5 MINUTES FOR 3 DOSES. IF NO RELIEF CALL 911 OR GO TO ER [name], MD Past Week Active Self, Pharmacy Records  rosuvastatin  (CRESTOR ) 20 MG tablet [id number] No TAKE ONE (1) TABLET BY MOUTH EVERY DAY [name], MD 12/11/2023 Morning Active Self, Pharmacy Records  [facility] syringe [id number]   [provider]  Active Self, Pharmacy Records  timolol  (TIMOPTIC ) 0.5 % ophthalmic solution [id number] No Place 1 drop into both eyes daily. [provider] 12/11/2023 Morning Active Self, Pharmacy Records  traZODone  (DESYREL ) 150 MG tablet [id number] No Take 1 tablet (150 mg total) by mouth at bedtime. Severa Rock HERO, FNP 12/10/2023 Bedtime Active Self, Pharmacy Records  TURMERIC PO [id number] No Take 1 tablet by mouth daily. [provider] 12/11/2023 Morning Active Self, Pharmacy Records  VENTOLIN  HFA 108 (90 Base) MCG/ACT inhaler [id number] No Inhale 1-2 puffs into the lungs every 4 (four) hours as needed for shortness of breath. Pearlean Manus, MD Past Week Active Self, Pharmacy Records  Vitamin D , Cholecalciferol , 10 MCG (400 UNIT) CAPS [id number] No Take 400 Units by mouth daily. [provider] 12/11/2023 Morning Active Self, Pharmacy Records            Recommendation:   Continue Current Plan of Care  Follow Up Plan:   Telephone follow-up in 1 month  Rosina Forte, BSN RN [facility], [facility] RN Care Manager Direct  Dial: [phone]  Fax: [phone]

## 2023-12-13 NOTE — Patient Instructions (Signed)
 Visit Information  Thank you for taking time to visit with me today. Please don't hesitate to contact me if I can be of assistance to you before our next scheduled appointment.  Your next care management appointment is by telephone on 01-14-2024 at 10:30 am  Telephone follow-up in 1 month  Please call the care guide team at [phone] if you need to cancel, schedule, or reschedule an appointment.   Please call the Suicide and Crisis Lifeline: 988 call the [place]  National Suicide Prevention Lifeline: 8632085345 or TTY: 315-302-1875 TTY 904-722-9333) to talk to a trained counselor call 1-800-273-TALK (toll free, 24 hour hotline) if you are experiencing a Mental Health or Behavioral Health Crisis or need someone to talk to.  Rosina Forte, BSN RN [facility], [facility] RN Care Manager Direct Dial: [phone]  Fax: [phone]

## 2023-12-13 NOTE — Telephone Encounter (Signed)
 Patient informed and verbalized understanding of plan.

## 2023-12-13 NOTE — Telephone Encounter (Signed)
 [name], MD  Bertrum Rosina HERO, RN; Severa Rock HERO, FNP; Mealor, Eulas BRAVO, MD Cc: Lenon Jinnie HERO, RN I reviewed the chart.  ECG in the ER is consistent with atrial tachycardia (or possibly atypical atrial flutter) with 2:1 block - not sinus tachycardia as reported.  Suspect her symptoms are due to this.  She has also had increasing atrial fibrillation burden based on my last note.  BNP was elevated in the 2000 range at ER visit as well.  I will forward to my office nurse, suggest having her seen in the atrial fibrillation clinic as quickest follow-up opportunity, need to discuss other treatment options including ablation and at a minimum should be considered for antiarrhythmic therapy and a repeat echocardiogram (to exclude interval development of cardiomyopathy).

## 2023-12-14 NOTE — Telephone Encounter (Signed)
 Mealor, Eulas BRAVO, MD  [name], MD; Bertrum Rosina HERO, RN; Severa Rock HERO, FNP Cc: Lenon Jinnie HERO, RN Looks like an atrial flutter to me. Dr. Kelsie documented that the patient had multiple flutter circuits during her ablation in 2022. I agree follow-up in AF clinic is in order to investigate whether she is a candidate for Tikosyn of Sotalol.

## 2023-12-21 ENCOUNTER — Ambulatory Visit (HOSPITAL_COMMUNITY)
Admission: RE | Admit: 2023-12-21 | Discharge: 2023-12-21 | Disposition: A | Discharge status: Home / Self Care | Source: Ambulatory Visit | Attending: Physician Assistant | Admitting: Physician Assistant

## 2023-12-21 VITALS — BP 122/60 | HR 86 | Ht 64.0 in | Wt 143.6 lb

## 2023-12-21 DIAGNOSIS — I4891 Unspecified atrial fibrillation: Secondary | ICD-10-CM

## 2023-12-21 DIAGNOSIS — I4819 Other persistent atrial fibrillation: Secondary | ICD-10-CM

## 2023-12-21 DIAGNOSIS — D6869 Other thrombophilia: Secondary | ICD-10-CM | POA: Diagnosis not present

## 2023-12-21 DIAGNOSIS — I484 Atypical atrial flutter: Secondary | ICD-10-CM | POA: Diagnosis not present

## 2023-12-21 NOTE — Progress Notes (Signed)
 Primary Care Physician: Severa Rock HERO, FNP Primary Cardiologist: Dr Debera Primary Electrophysiologist: Dr Nancey  Referring Physician: Dr Debera Pagan KATELEN LUEPKE is a 83 y.o. female with a history of CAD, HTN, DM, HLD, CHF, atrial flutter, and persistent atrial fibrillation who presents for follow up in the [facility]rial Fibr[facility]. The patient was initially diagnosed with atrial fibrillation on 11/2017 after presenting with symptoms of palpitations and chest pain to ER and found to be in afib with RVR. She was discharged with Eliquis  and rate control with plan to cardiovert on f/u but she had converted on her own. She denies snoring or significant alcohol use. Patient was seen at the ER 11/11/18 with symptoms of SOB, chest tightness, and palpitations for 3 days. She was found to be in afib with RVR and underwent successful DCCV. Zio patch prior to this episode showed no afib but did show very frequent short episodes of SVT. She was started on amiodarone  which controlled her afib but unfortunately she developed hypothyroidism and this was discontinued.   Patient is s/p afib ablation with Dr Kelsie on 02/16/21. There were multiple atypical atrial flutter circuits noted. Post ablation, she had an increase in edema in her right leg, going up to her mid thigh. She started Lasix  daily with only minimal improvement. She reports compliance with Eliquis  and Plavix . Venous u/s showed DVT in R common femoral vein. She saw Dr Sheree and recommendation was to continue Eliquis  and use compression stockings with no other intervention.   Patient returns for follow up for atrial fibrillation. She was seen at the ED 12/11/23 with symptoms of palpitations. ECG suspicious for atrial flutter at that time. Pro BNP elevated at 2634. Her ILR shows 1.6% afib burden but suspect undersensing based on heart rate trends. She is in atrial flutter today. She has symptoms of fatigue. She has also had nausea and  diarrhea, plans to follow up with PCP for this next week. No bleeding issues on anticoagulation. She did miss a dose of Eliquis  on 10/12.  Today, she  denies symptoms of palpitations, chest pain, orthopnea, PND, lower extremity edema, dizziness, presyncope, syncope, snoring, daytime somnolence, bleeding, or neurologic sequela. The patient is tolerating medications without difficulties and is otherwise without complaint today.    Atrial Fibrillation Risk Factors:  she does not have symptoms or diagnosis of sleep apnea. she does not have a history of rheumatic fever. she does not have a history of alcohol use. The patient does not have a history of early familial atrial fibrillation or other arrhythmias.   Atrial Fibrillation Management history:  Previous antiarrhythmic drugs: amiodarone   Previous cardioversions: 11/11/18 Previous ablations: 02/16/21 Anticoagulation history: Eliquis    Past Medical History:  Diagnosis Date   Anxiety    Arthritis    Bulging lumbar disc    Chronic lower back pain    Coronary atherosclerosis of native coronary artery    DES LAD 02/2008, DES LAD 02/2015, DES ramus and DES x2 mid RCA 02/2020   Depression    Dyslipidemia    Essential hypertension    Facial numbness    Frequent headaches    GERD (gastroesophageal reflux disease)    Glaucoma    Heart attack (HCC) 02/2020   Insomnia    Paroxysmal atrial fibrillation (HCC)    Diagnosed October 2019   PSVT (paroxysmal supraventricular tachycardia)    Refusal of blood transfusions as patient is Jehovah's Witness    Thyroid  disease  Type 2 diabetes mellitus (HCC)    Borderline   Vitamin D  deficiency      Current Outpatient Medications  Medication Sig Dispense Refill   acetaminophen  (TYLENOL ) 500 MG tablet Take 1,000 mg by mouth every 6 (six) hours as needed for moderate pain. (Patient taking differently: Take 1,000 mg by mouth as needed for moderate pain (pain score 4-6).)     Ascorbic Acid  (VITAMIN  C) 500 MG tablet Take 500 mg by mouth daily.       bimatoprost (LUMIGAN) 0.01 % SOLN Place 1 drop into both eyes at bedtime.     Calcium  Carbonate Antacid (TUMS PO) Take 2 tablets by mouth 4 (four) times daily as needed (for acid reflux/indigestion).      Camphor-Menthol -Methyl Sal (SALONPAS ) 3.02-26-08 % PTCH Apply 1 patch topically daily as needed (pain).     cetirizine (ZYRTEC) 10 MG tablet Take 10 mg by mouth daily as needed for allergies.     Cyanocobalamin  (VITAMIN B 12 PO) Take 1 tablet by mouth daily.     diclofenac  Sodium (VOLTAREN ) 1 % GEL Apply 2 g topically 4 (four) times daily. 50 g 2   diltiazem  (CARDIZEM  CD) 300 MG 24 hr capsule Take 1 capsule (300 mg total) by mouth daily. 90 capsule 2   diltiazem  (CARDIZEM ) 60 MG tablet Take 1 every 8 hours as needed for fast heart rate and palpitations 60 tablet 1   DULoxetine  (CYMBALTA ) 60 MG capsule Take 1 capsule (60 mg total) by mouth daily. 90 capsule 1   ELIQUIS  5 MG TABS tablet TAKE ONE TABLET BY MOUTH TWICE DAILY 60 tablet 5   GARLIC  PO Take 1 tablet by mouth daily.     HYDROcodone -acetaminophen  (NORCO) 7.5-325 MG tablet Take 1 tablet by mouth every 6 (six) hours as needed for moderate pain (pain score 4-6). (Patient taking differently: Take 1 tablet by mouth as needed for moderate pain (pain score 4-6).) 30 tablet 0   lidocaine  (HM LIDOCAINE  PATCH) 4 % Place 2 patches onto the skin daily. (Patient taking differently: Place 2 patches onto the skin as needed.) 10 patch 0   losartan  (COZAAR ) 50 MG tablet Take 1.5 tablets (75 mg total) by mouth daily. 135 tablet 3   MAGNESIUM  CARBONATE PO Take 1 tablet by mouth at bedtime.     metFORMIN  (GLUCOPHAGE ) 500 MG tablet Take 1 tablet (500 mg total) by mouth 2 (two) times daily with a meal. 180 tablet 0   metoprolol  succinate (TOPROL -XL) 100 MG 24 hr tablet TAKE 1 TABLET BY MOUTH DAILY WITH FOOD 90 tablet 1   nitroGLYCERIN  (NITROSTAT ) 0.4 MG SL tablet DISSOLVE 1 TAB UNDER TOUNGE FOR CHEST PAIN. MAY  REPEAT EVERY 5 MINUTES FOR 3 DOSES. IF NO RELIEF CALL 911 OR GO TO ER 25 tablet 2   rosuvastatin  (CRESTOR ) 20 MG tablet TAKE ONE (1) TABLET BY MOUTH EVERY DAY 90 tablet 1   timolol  (TIMOPTIC ) 0.5 % ophthalmic solution Place 1 drop into both eyes daily.     traZODone  (DESYREL ) 150 MG tablet Take 1 tablet (150 mg total) by mouth at bedtime. 90 tablet 0   TURMERIC PO Take 1 tablet by mouth daily.     VENTOLIN  HFA 108 (90 Base) MCG/ACT inhaler Inhale 1-2 puffs into the lungs every 4 (four) hours as needed for shortness of breath. 18 g 1   Vitamin D , Cholecalciferol , 10 MCG (400 UNIT) CAPS Take 400 Units by mouth daily.     HYDROcodone -acetaminophen  (NORCO) 7.5-325 MG tablet Take  1 tablet by mouth every 6 (six) hours as needed for moderate pain (pain score 4-6). 30 tablet 0   HYDROcodone -acetaminophen  (NORCO) 7.5-325 MG tablet Take 1 tablet by mouth every 6 (six) hours as needed for moderate pain (pain score 4-6). (Patient not taking: Reported on 12/11/2023) 30 tablet 0   levothyroxine  (SYNTHROID ) 25 MCG tablet Take 1 tablet (25 mcg total) by mouth daily before breakfast. 90 tablet 3   SPIKEVAX syringe      No current facility-administered medications for this encounter.    ROS- All systems are reviewed and negative except as per the HPI above.  Physical Exam: Vitals:   12/21/23 1547  Weight: 65.1 kg  Height: 5' 4 (1.626 m)     GEN: Well nourished, well developed in no acute distress CARDIAC: Irregularly irregular rate and rhythm, no murmurs, rubs, gallops RESPIRATORY:  Clear to auscultation without rales, wheezing or rhonchi  ABDOMEN: Soft, non-tender, non-distended EXTREMITIES:  No edema; No deformity    Wt Readings from Last 3 Encounters:  12/21/23 65.1 kg  12/11/23 63.5 kg  10/16/23 64.5 kg    EKG today demonstrates  Atypical atrial flutter with variable block Vent. rate 86 BPM PR interval * ms QRS duration 72 ms QT/QTcB 376/449 ms   Echo 01/11/22 demonstrated   1. Left  ventricular ejection fraction, by estimation, is 55 to 60%. The  left ventricle has normal function. The left ventricle has no regional  wall motion abnormalities. Left ventricular diastolic parameters are  consistent with Grade II diastolic dysfunction (pseudonormalization). The average left ventricular global longitudinal strain is -18.0 %.   2. Right ventricular systolic function is mildly reduced. The right  ventricular size is normal.   3. The mitral valve is abnormal Severe mitral annular calcification.      Moderate mitral valve regurgitation. There appears to be at least mild  mitral valve stenosis based on visual estimation.   4. The aortic valve is abnormal. There is moderate calcification of the  aortic valve with moderate restriction of leaflet mobility. Aortic valve  regurgitation is mild to moderate. Mild aortic valve stenosis.   5. The inferior vena cava is normal in size with greater than 50%  respiratory variability, suggesting right atrial pressure of 3 mmHg.   Comparison(s): Prior images reviewed side by side. Changes from prior  study are noted.    Epic records are reviewed at length today  CHA2DS2-VASc Score = 7  The patient's score is based upon: CHF History: 1 HTN History: 1 Diabetes History: 1 Stroke History: 0 Vascular Disease History: 1 Age Score: 2 Gender Score: 1       ASSESSMENT AND PLAN: Persistent Atrial Fibrillation/atrial flutter (ICD10:  I48.19) The patient's CHA2DS2-VASc score is 7, indicating a 11.2% annual risk of stroke.   Patient in rate controlled atrial flutter, symptomatic.  We discussed rhythm control options today. Recall amiodarone  discontinued due to tyroid issues. Would avoid class IC due to CAD.  Patient would like to pursue dofetilide admission No recent benadryl use PharmD to screen medications for QT prolonging agents. She will discuss stopping trazodone  with her PCP.  QTc in SR 428 ms Check magnesium  today.  Continue  Eliquis  5 mg BID Continue diltiazem  300 mg daily Continue Toprol  100 mg daily  Secondary Hypercoagulable State (ICD10:  D68.69) The patient is at significant risk for stroke/thromboembolism based upon her CHA2DS2-VASc Score of 7.  Continue Apixaban  (Eliquis ). No bleeding issues.   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 AF clinic on 11/18 for dofetilide admission.    Daril Kicks PA-C Afib Clinic [facility] [address] [place], [place] [address] [phone] 12/21/2023 3:55 PM

## 2023-12-21 NOTE — Patient Instructions (Signed)
 [facility] Admission is scheduled for: November 18th   Come to the Afib Clinic for pre-admission work up at: 9:00 AM   You may eat/drink prior to the appointment. Please take all your normal morning medications prior to arrival.   Medications that need to be held or stopped prior to admission date: Trazodone    If you should miss any doses of your blood thinner going forward please notify our office immediately.   If you start any new medications after your admission is scheduled please notify our office immediately.  No Benadryl use at least 3 days prior to admission.   We will start the authorization process with your insurance for the hospital stay.   You may bring a suitcase when you check in to admissions with belongings for your stay - clothes to keep you comfortable and things to keep you busy.   All medications will be provided for you while in the hospital.  If you are on a CPAP machine - please bring with you to the hospital.    Let me know if you have any questions regarding the above instructions or about the admission in general.   [facility] RN Afib Clinic [phone]

## 2023-12-22 LAB — MAGNESIUM: Magnesium: 2.1 mg/dL (ref 1.6–2.3)

## 2023-12-24 ENCOUNTER — Ambulatory Visit (HOSPITAL_COMMUNITY): Payer: Self-pay | Admitting: Physician Assistant

## 2023-12-26 ENCOUNTER — Telehealth: Payer: Self-pay

## 2023-12-26 NOTE — Telephone Encounter (Signed)
 Copied from CRM [id number]. Topic: Clinical - Medication Question >> Dec 26, 2023 11:38 AM Ivette P wrote: Reason for CRM: Pt called in because she is going to test the [facility] and was told to tell primary that she needs to be changed medicaiton from traZODone  (DESYREL ) 150 MG tablet to something that is more agreeable to her.   Pls follow up with pt, requested to speak to primary.

## 2023-12-26 NOTE — Telephone Encounter (Signed)
Patient aware and verbalizes understanding.  Apt scheduled.

## 2023-12-28 ENCOUNTER — Ambulatory Visit (INDEPENDENT_AMBULATORY_CARE_PROVIDER_SITE_OTHER): Admitting: Family Medicine

## 2023-12-28 ENCOUNTER — Encounter: Payer: Self-pay | Admitting: Cardiovascular Disease

## 2023-12-28 ENCOUNTER — Ambulatory Visit: Attending: Cardiovascular Disease | Admitting: Cardiovascular Disease

## 2023-12-28 ENCOUNTER — Telehealth (HOSPITAL_COMMUNITY): Payer: Self-pay

## 2023-12-28 ENCOUNTER — Encounter: Payer: Self-pay | Admitting: Family Medicine

## 2023-12-28 VITALS — BP 138/84 | HR 103 | Ht 64.0 in | Wt 142.4 lb

## 2023-12-28 VITALS — BP 132/58 | HR 59 | Temp 96.5°F | Ht 64.0 in | Wt 142.4 lb

## 2023-12-28 DIAGNOSIS — F5101 Primary insomnia: Secondary | ICD-10-CM | POA: Diagnosis not present

## 2023-12-28 DIAGNOSIS — I480 Paroxysmal atrial fibrillation: Secondary | ICD-10-CM | POA: Diagnosis not present

## 2023-12-28 DIAGNOSIS — I4891 Unspecified atrial fibrillation: Secondary | ICD-10-CM | POA: Diagnosis not present

## 2023-12-28 DIAGNOSIS — Z23 Encounter for immunization: Secondary | ICD-10-CM

## 2023-12-28 DIAGNOSIS — F339 Major depressive disorder, recurrent, unspecified: Secondary | ICD-10-CM

## 2023-12-28 MED ORDER — BELSOMRA 5 MG PO TABS: 5.0000 mg | ORAL_TABLET | Freq: Every evening | ORAL | 0 refills | Status: DC | PRN

## 2023-12-28 NOTE — Patient Instructions (Addendum)
Medication Instructions:  Your physician recommends that you continue on your current medications as directed. Please refer to the Current Medication list given to you today.  Labwork: none  Testing/Procedures: none  Follow-Up: Your physician recommends that you schedule a follow-up appointment in: as planned  Any Other Special Instructions Will Be Listed Below (If Applicable).  If you need a refill on your cardiac medications before your next appointment, please call your pharmacy.

## 2023-12-28 NOTE — Telephone Encounter (Signed)
 Initiated prior authorization to [facility] for Tikosyn admission. Date of service: 01/08/2024 Reference # [id number]  [facility] will reach out once they are ready for us  to fax clinical information.  Phone # [phone]

## 2023-12-28 NOTE — Progress Notes (Signed)
 Subjective:  Patient ID: [name], female    DOB: [birth year], 83 y.o.   MRN: [id number]  Patient Care Team: [name], FNP as PCP - General (Family Medicine) [name], MD as PCP - Cardiology (Cardiology) Mealor, Eulas BRAVO, MD as PCP - Electrophysiology (Cardiology) [name], MD as Consulting Physician (Otolaryngology) [name], [facility] (Pharmacist) [name], NP as Nurse Practitioner (Endocrinology) [facility] [name], RN as [facility] Care Management (General Practice) [name], RN   Chief Complaint:  Insomnia   HPI: [name] is a 83 y.o. female presenting on 12/28/2023 for Insomnia   [name] is an 83 year old female with atrial fibrillation who presents for medication management related to Tikosyn initiation.  Atrial fibrillation and medication management - Scheduled for hospital admission on November 18th for Tikosyn initiation - Currently taking Eliquis  for atrial fibrillation - Discontinued trazodone  and Benadryl due to concerns about QT interval prolongation  Sleep disturbance - Previously used trazodone  intermittently for sleep, but it was sometimes ineffective - Tried melatonin up to 20 mg in the past without success  Hot flashes - Experiencing hot flashes associated with recent stress and a visit to the emergency room - Hot flashes began after the recent emergency room visit  Anxiety and depression - Currently taking duloxetine  for anxiety and depression - Duloxetine  has been used for an extended period          Relevant past medical, surgical, family, and social history reviewed and updated as indicated.  Allergies and medications reviewed and updated. Data reviewed: Chart in Epic.   Past Medical History:  Diagnosis Date   Anxiety    Arthritis    Bulging lumbar disc    Chronic lower back pain    Coronary atherosclerosis of native coronary artery    DES LAD 02/2008, DES  LAD 02/2015, DES ramus and DES x2 mid RCA 02/2020   Depression    Dyslipidemia    Essential hypertension    Facial numbness    Frequent headaches    GERD (gastroesophageal reflux disease)    Glaucoma    Heart attack (HCC) 02/2020   Insomnia    Paroxysmal atrial fibrillation (HCC)    Diagnosed October 2019   PSVT (paroxysmal supraventricular tachycardia)    Refusal of blood transfusions as patient is Jehovah's Witness    Thyroid  disease    Type 2 diabetes mellitus (HCC)    Borderline   Vitamin D  deficiency     Past Surgical History:  Procedure Laterality Date   ATRIAL FIBRILLATION ABLATION N/A 02/16/2021   Procedure: ATRIAL FIBRILLATION ABLATION;  Surgeon: Kelsie Agent, MD;  Location: [facility] INVASIVE CV LAB;  Service: Cardiovascular;  Laterality: N/A;   BIOPSY  09/21/2021   Procedure: BIOPSY;  Surgeon: Shaaron Lamar CHRISTELLA, MD;  Location: [facility] ENDO SUITE;  Service: Endoscopy;;  gastric   CARDIAC CATHETERIZATION N/A 02/23/2015   Procedure: Left Heart Cath and Coronary Angiography;  Surgeon: Ozell Fell, MD;  Location: [facility] INVASIVE CV LAB;  Service: Cardiovascular;  Laterality: N/A;   CARDIAC CATHETERIZATION N/A 02/23/2015   Procedure: Coronary Stent Intervention;  Surgeon: Ozell Fell, MD;  Location: [facility] INVASIVE CV LAB;  Service: Cardiovascular;  Laterality: N/A;   CARDIAC CATHETERIZATION N/A 01/04/2016   Procedure: Left Heart Cath and Coronary Angiography;  Surgeon: Peter M Jordan, MD;  Location: [facility] INVASIVE CV LAB;  Service: Cardiovascular;  Laterality: N/A;   CATARACT EXTRACTION  CORONARY ANGIOPLASTY  02/23/2015   CORONARY ANGIOPLASTY WITH STENT PLACEMENT  2009   CORONARY STENT INTERVENTION N/A 03/09/2020   Procedure: CORONARY STENT INTERVENTION;  Surgeon: Dann Candyce RAMAN, MD;  Location: [facility] INVASIVE CV LAB;  Service: Cardiovascular;  Laterality: N/A;   DILATION AND CURETTAGE OF UTERUS     ESOPHAGOGASTRODUODENOSCOPY (EGD) WITH PROPOFOL  N/A 09/21/2021   normal esophagus,  abnormal gastric mucosa s/p biopsy, normal duodenum. Pathology with H.pylori. Prescribed Prevpac.   implantable loop recorder placement  04/01/2020   Medtronic Reveal [place] model LNQ 22 [id number]) implantable loop recorder    LEFT HEART CATH AND CORONARY ANGIOGRAPHY N/A 03/09/2020   Procedure: LEFT HEART CATH AND CORONARY ANGIOGRAPHY;  Surgeon: Dann Candyce RAMAN, MD;  Location: [facility] INVASIVE CV LAB;  Service: Cardiovascular;  Laterality: N/A;   TUBAL LIGATION      Social History   Socioeconomic History   Marital status: Widowed    Spouse name: Not on file   Number of children: 8   Years of education: 10th grade   Highest education level: 10th grade  Occupational History   Occupation: RETIRED  Tobacco Use   Smoking status: Never    Passive exposure: Never   Smokeless tobacco: Never   Tobacco comments:    Never smoke 11/15/21  Vaping Use   Vaping status: Never Used  Substance and Sexual Activity   Alcohol use: Not Currently    Alcohol/week: 1.0 standard drink of alcohol    Types: 1 Glasses of wine per week    Comment: RARE OCCASION   Drug use: No   Sexual activity: Not Currently  Other Topics Concern   Not on file  Social History Narrative   Lives at home alone.   Right-handed.   No caffeine use.   Daughter lives next door and helps her out   Social Drivers of Health   Financial Resource Strain: Low Risk  (03/21/2023)   Received from [facility]   Overall Financial Resource Strain (CARDIA)    Difficulty of Paying Living Expenses: Not very hard  Food Insecurity: No Food Insecurity (12/13/2023)   Hunger Vital Sign    Worried About Running Out of Food in the Last Year: Never true    Ran Out of Food in the Last Year: Never true  Transportation Needs: No Transportation Needs (12/13/2023)   PRAPARE - Administrator, Civil Service (Medical): No    Lack of Transportation (Non-Medical): No  Physical Activity: Inactive (03/05/2023)   Exercise Vital Sign     Days of Exercise per Week: 0 days    Minutes of Exercise per Session: 0 min  Stress: No Stress Concern Present (03/05/2023)   [employer] of Occupational Health - Occupational Stress Questionnaire    Feeling of Stress : Not at all  Social Connections: Moderately Isolated (03/05/2023)   Social Connection and Isolation Panel    Frequency of Communication with Friends and Family: More than three times a week    Frequency of Social Gatherings with Friends and Family: Three times a week    Attends Religious Services: More than 4 times per year    Active Member of Clubs or Organizations: No    Attends Banker Meetings: Never    Marital Status: Widowed  Intimate Partner Violence: Not At Risk (12/13/2023)   Humiliation, Afraid, Rape, and Kick questionnaire    Fear of Current or Ex-Partner: No    Emotionally Abused: No    Physically Abused: No  Sexually Abused: No    Outpatient Encounter Medications as of 12/28/2023  Medication Sig   acetaminophen  (TYLENOL ) 500 MG tablet Take 1,000 mg by mouth every 6 (six) hours as needed for moderate pain. (Patient taking differently: Take 1,000 mg by mouth as needed for moderate pain (pain score 4-6).)   Ascorbic Acid  (VITAMIN C ) 500 MG tablet Take 500 mg by mouth daily.     bimatoprost (LUMIGAN) 0.01 % SOLN Place 1 drop into both eyes at bedtime.   Calcium  Carbonate Antacid (TUMS PO) Take 2 tablets by mouth 4 (four) times daily as needed (for acid reflux/indigestion).    Camphor-Menthol -Methyl Sal (SALONPAS ) 3.02-26-08 % PTCH Apply 1 patch topically daily as needed (pain).   cetirizine (ZYRTEC) 10 MG tablet Take 10 mg by mouth daily as needed for allergies.   Cyanocobalamin  (VITAMIN B 12 PO) Take 1 tablet by mouth daily.   diclofenac  Sodium (VOLTAREN ) 1 % GEL Apply 2 g topically 4 (four) times daily.   diltiazem  (CARDIZEM  CD) 300 MG 24 hr capsule Take 1 capsule (300 mg total) by mouth daily.   diltiazem  (CARDIZEM ) 60 MG tablet Take 1 every  8 hours as needed for fast heart rate and palpitations   DULoxetine  (CYMBALTA ) 60 MG capsule Take 1 capsule (60 mg total) by mouth daily.   ELIQUIS  5 MG TABS tablet TAKE ONE TABLET BY MOUTH TWICE DAILY   GARLIC  PO Take 1 tablet by mouth daily.   HYDROcodone -acetaminophen  (NORCO) 7.5-325 MG tablet Take 1 tablet by mouth every 6 (six) hours as needed for moderate pain (pain score 4-6). (Patient taking differently: Take 1 tablet by mouth as needed for moderate pain (pain score 4-6).)   HYDROcodone -acetaminophen  (NORCO) 7.5-325 MG tablet Take 1 tablet by mouth every 6 (six) hours as needed for moderate pain (pain score 4-6).   HYDROcodone -acetaminophen  (NORCO) 7.5-325 MG tablet Take 1 tablet by mouth every 6 (six) hours as needed for moderate pain (pain score 4-6).   levothyroxine  (SYNTHROID ) 25 MCG tablet Take 1 tablet (25 mcg total) by mouth daily before breakfast.   lidocaine  (HM LIDOCAINE  PATCH) 4 % Place 2 patches onto the skin daily. (Patient taking differently: Place 2 patches onto the skin as needed.)   losartan  (COZAAR ) 50 MG tablet Take 1.5 tablets (75 mg total) by mouth daily.   MAGNESIUM  CARBONATE PO Take 1 tablet by mouth at bedtime.   metFORMIN  (GLUCOPHAGE ) 500 MG tablet Take 1 tablet (500 mg total) by mouth 2 (two) times daily with a meal.   metoprolol  succinate (TOPROL -XL) 100 MG 24 hr tablet TAKE 1 TABLET BY MOUTH DAILY WITH FOOD   nitroGLYCERIN  (NITROSTAT ) 0.4 MG SL tablet DISSOLVE 1 TAB UNDER TOUNGE FOR CHEST PAIN. MAY REPEAT EVERY 5 MINUTES FOR 3 DOSES. IF NO RELIEF CALL 911 OR GO TO ER   rosuvastatin  (CRESTOR ) 20 MG tablet TAKE ONE (1) TABLET BY MOUTH EVERY DAY   SPIKEVAX syringe    Suvorexant (BELSOMRA) 5 MG TABS Take 1 tablet (5 mg total) by mouth at bedtime as needed.   timolol  (TIMOPTIC ) 0.5 % ophthalmic solution Place 1 drop into both eyes daily.   TURMERIC PO Take 1 tablet by mouth daily.   VENTOLIN  HFA 108 (90 Base) MCG/ACT inhaler Inhale 1-2 puffs into the lungs every 4  (four) hours as needed for shortness of breath.   Vitamin D , Cholecalciferol , 10 MCG (400 UNIT) CAPS Take 400 Units by mouth daily.   [DISCONTINUED] traZODone  (DESYREL ) 150 MG tablet Take 1 tablet (150 mg  total) by mouth at bedtime.   No facility-administered encounter medications on file as of 12/28/2023.    No Known Allergies  Pertinent ROS per HPI, otherwise unremarkable      Objective:  BP (!) 132/58   Pulse (!) 59   Temp (!) 96.5 F (35.8 C)   Ht 5' 4 (1.626 m)   Wt 142 lb 6.4 oz (64.6 kg)   LMP  (LMP Unknown)   SpO2 98%   BMI 24.44 kg/m    Wt Readings from Last 3 Encounters:  12/28/23 142 lb 6.4 oz (64.6 kg)  12/28/23 142 lb 6.4 oz (64.6 kg)  12/21/23 143 lb 9.6 oz (65.1 kg)    Physical Exam Vitals and nursing note reviewed.  Constitutional:      General: She is not in acute distress.    Appearance: Normal appearance. She is well-developed and well-groomed. She is not ill-appearing, toxic-appearing or diaphoretic.  HENT:     Head: Normocephalic and atraumatic.     Jaw: There is normal jaw occlusion.     Right Ear: Hearing normal.     Left Ear: Hearing normal.     Nose: Nose normal.     Mouth/Throat:     Lips: Pink.     Mouth: Mucous membranes are moist.     Pharynx: Oropharynx is clear. Uvula midline.  Eyes:     General: Lids are normal.     Extraocular Movements: Extraocular movements intact.     Conjunctiva/sclera: Conjunctivae normal.     Pupils: Pupils are equal, round, and reactive to light.  Neck:     Thyroid : No thyroid  mass, thyromegaly or thyroid  tenderness.     Vascular: No carotid bruit or JVD.     Trachea: Trachea and phonation normal.  Cardiovascular:     Rate and Rhythm: Regular rhythm. Bradycardia present.     Chest Wall: PMI is not displaced.     Pulses: Normal pulses.     Heart sounds: Normal heart sounds. No murmur heard.    No friction rub. No gallop.  Pulmonary:     Effort: Pulmonary effort is normal. No respiratory distress.      Breath sounds: Normal breath sounds. No wheezing.  Abdominal:     General: Bowel sounds are normal. There is no distension or abdominal bruit.     Palpations: Abdomen is soft. There is no hepatomegaly or splenomegaly.     Tenderness: There is no abdominal tenderness. There is no right CVA tenderness or left CVA tenderness.     Hernia: No hernia is present.  Musculoskeletal:        General: Normal range of motion.     Cervical back: Normal range of motion and neck supple.     Right lower leg: No edema.     Left lower leg: No edema.  Lymphadenopathy:     Cervical: No cervical adenopathy.  Skin:    General: Skin is warm and dry.     Capillary Refill: Capillary refill takes less than 2 seconds.     Coloration: Skin is not cyanotic, jaundiced or pale.     Findings: No rash.  Neurological:     General: No focal deficit present.     Mental Status: She is alert and oriented to person, place, and time.     Sensory: Sensation is intact.     Motor: Motor function is intact.     Coordination: Coordination is intact.     Gait: Gait is intact.  Deep Tendon Reflexes: Reflexes are normal and symmetric.  Psychiatric:        Attention and Perception: Attention and perception normal.        Mood and Affect: Mood and affect normal.        Speech: Speech normal.        Behavior: Behavior normal. Behavior is cooperative.        Thought Content: Thought content normal.        Cognition and Memory: Cognition and memory normal.        Judgment: Judgment normal.    Physical Exam            Results for orders placed or performed during the hospital encounter of 12/21/23  Magnesium    Collection Time: 12/21/23  4:46 PM  Result Value Ref Range   Magnesium  2.1 1.6 - 2.3 mg/dL       Pertinent labs & imaging results that were available during my care of the patient were reviewed by me and considered in my medical decision making.  Assessment & Plan:  Gilbert was seen today for  insomnia.  Diagnoses and all orders for this visit:  Primary insomnia -     Suvorexant (BELSOMRA) 5 MG TABS; Take 1 tablet (5 mg total) by mouth at bedtime as needed.  Paroxysmal atrial fibrillation (HCC)  Encounter for immunization -     Flu vaccine HIGH DOSE PF(Fluzone Trivalent)  Depression, recurrent      Insomnia in the setting of atrial fibrillation and medication management Insomnia management is complicated by atrial fibrillation and the need to avoid QT prolongation. Trazodone  and Benadryl are contraindicated due to QT prolongation and interaction with Tikosyn. Melatonin is a safer alternative, though previously ineffective. Belsomra is considered as it does not prolong the QT interval but may cause palpitations. Melatonin formulations with immediate and extended release may improve efficacy. - Discontinued trazodone  and Benadryl. - Prescribed Belsomra 5 mg for trial, with potential titration if needed. - Recommended melatonin with immediate and extended release formulations. - Will consult cardiology for additional sleep medication suggestions compatible with Tikosyn.  Atrial fibrillation Management includes Tikosyn, necessitating careful medication selection to avoid QT prolongation. Current medications include diltiazem  and duloxetine , which may contribute to hot flashes. - Continue Tikosyn as prescribed. - Monitor for QT prolongation with sleep medications. - Will evaluate hot flashes after stabilization of atrial fibrillation.  Depression and anxiety Managed with duloxetine , which may contribute to hot flashes. Recent stressors may exacerbate symptoms. Adjustments to medication will be considered after stabilization of atrial fibrillation. - Continue duloxetine  for depression and anxiety. - Will reassess need for medication adjustment after atrial fibrillation stabilization.          Continue all other maintenance medications.  Follow up plan: Return if symptoms  worsen or fail to improve.   Continue healthy lifestyle choices, including diet (rich in fruits, vegetables, and lean proteins, and low in salt and simple carbohydrates) and exercise (at least 30 minutes of moderate physical activity daily).  Educational handout given for insomnia   The above assessment and management plan was discussed with the patient. The patient verbalized understanding of and has agreed to the management plan. Patient is aware to call the clinic if they develop any new symptoms or if symptoms persist or worsen. Patient is aware when to return to the clinic for a follow-up visit. Patient educated on when it is appropriate to go to the emergency department.   Julie Bruns, FNP-C [facility] [phone]

## 2023-12-28 NOTE — Progress Notes (Signed)
 Primary Care Physician: Severa Rock HERO, FNP Primary Cardiologist: Dr Debera Primary Electrophysiologist: Dr Nancey ( Referring Physician: Dr Debera Pagan [name] is a 83 y.o. female with a history of CAD, HTN, DM, HLD, and persistent atrial fibrillation who presents for follow    The patient was initially diagnosed with atrial fibrillation on 11/2017 after presenting with symptoms of palpitations and chest pain. She has had recurrences and underwent cardioversion. Monitor showed bursts of SVT. She failed amiodarone  due to hypothyroidism.  Dr Kelsie performed AF ablation on 02/16/21. There were multiple atypical atrial flutter circuits noted. Post ablation, she had a R common femoral vein DVT.   she has no device related complaints -- no new tenderness, drainage, redness.  She has complained of episodes of palpitations and atrial fibrillation.  Her loop recorder did not pick up any episodes until the past several months when she has been noted to have atrial fibrillation.  Today's EKG shows what I suspect is an atypical atrial flutter or atrial tachycardia.  I suspect that she has been having arrhythmia that has not been detected by the loop recorder.  She was seen in atrial fibrillation clinic and scheduled for Tikosyn load.   Atrial Fibrillation Management history:  Previous antiarrhythmic drugs: amiodarone   Previous cardioversions: 11/11/18 Previous ablations: 02/16/21 CHADS2VASC score: 6 Anticoagulation history: Eliquis      No Known Allergies      ROS- All systems are reviewed and negative except as per the HPI above.  Physical Exam: Vitals:   12/28/23 1354 12/28/23 1401  BP: (!) 140/82 138/84  Pulse: (!) 103   SpO2: 99%   Weight: 142 lb 6.4 oz (64.6 kg)   Height: 5' 4 (1.626 m)       GEN- The patient is a well appearing elderly female, alert and oriented x 3 today.   Lungs- normal work of breathing Heart- Regular rate and rhythm, no murmurs, rubs or  gallops    Wt Readings from Last 3 Encounters:  12/28/23 142 lb 6.4 oz (64.6 kg)  12/21/23 143 lb 9.6 oz (65.1 kg)  12/11/23 140 lb (63.5 kg)   she has a BMI of Body mass index is 24.44 kg/m.SABRA Filed Weights   12/28/23 1354  Weight: 142 lb 6.4 oz (64.6 kg)     Echo 12/11/19 demonstrated   1. Left ventricular ejection fraction, by estimation, is 65 to 70%. The  left ventricle has normal function. The left ventricle has no regional  wall motion abnormalities. There is mild left ventricular hypertrophy.  Left ventricular diastolic parameters are consistent with Grade II diastolic dysfunction (pseudonormalization). Elevated left atrial pressure.   2. Right ventricular systolic function is normal. The right ventricular  size is normal. There is mildly elevated pulmonary artery systolic  pressure.   3. The mitral valve is normal in structure. Mild mitral valve  regurgitation. No evidence of mitral stenosis. Moderate mitral annular calcification.   4. The aortic valve has an indeterminant number of cusps. There is mild calcification of the aortic valve. There is mild thickening of the aortic valve. Aortic valve regurgitation is mild. No aortic stenosis is present.   5. Mild pulmonary HTN, PASP is 36 mmHg.   6. The inferior vena cava is normal in size with greater than 50%  respiratory variability, suggesting right atrial pressure of 3 mmHg.    Epic records are reviewed at length today  CHA2DS2-VASc Score = 7  The patient's score is based upon: CHF History:  1 HTN History: 1 Diabetes History: 1 Stroke History: 0 Vascular Disease History: 1 Age Score: 2 Gender Score: 1        ASSESSMENT AND PLAN: 1. Persistent Atrial Fibrillation (ICD10:  I48.19), Atypical atrial flutter The patient's CHA2DS2-VASc score is 7, indicating a 11.2% annual risk of stroke.   Recall amiodarone  d/c 2/2 elevated TSH (stopped 11/11/19) Would avoid class IC with h/o CAD. S/p afib ablation with Dr Kelsie  on 02/16/21 Continue Eliquis  5 mg BID  Continue diltiazem  240 mg daily Continue Toprol  100 mg daily She is already set up for Tikosyn load later this month. Continue to monitor with loop recorder    2. Secondary Hypercoagulable State (ICD10:  D68.69) The patient is at significant risk for stroke/thromboembolism based upon her CHA2DS2-VASc Score of 7.  Continue Apixaban  (Eliquis ).   3. CAD No anginal symptoms.  4. HTN Stable, no changes today.  5. Chronic diastolic CHF Fluid status appears stable.    Eulas FORBES [place], MD  12/28/2023 2:26 PM

## 2023-12-30 ENCOUNTER — Ambulatory Visit (INDEPENDENT_AMBULATORY_CARE_PROVIDER_SITE_OTHER)

## 2023-12-30 DIAGNOSIS — I4891 Unspecified atrial fibrillation: Secondary | ICD-10-CM

## 2023-12-30 LAB — CUP PACEART REMOTE DEVICE CHECK
Date Time Interrogation Session: 20251108230112
Implantable Pulse Generator Implant Date: 20220210

## 2023-12-31 ENCOUNTER — Ambulatory Visit: Attending: Cardiology

## 2023-12-31 ENCOUNTER — Encounter

## 2023-12-31 ENCOUNTER — Telehealth: Payer: Self-pay | Admitting: Family Medicine

## 2023-12-31 DIAGNOSIS — I4891 Unspecified atrial fibrillation: Secondary | ICD-10-CM | POA: Diagnosis not present

## 2023-12-31 DIAGNOSIS — I4719 Other supraventricular tachycardia: Secondary | ICD-10-CM

## 2023-12-31 DIAGNOSIS — I25119 Atherosclerotic heart disease of native coronary artery with unspecified angina pectoris: Secondary | ICD-10-CM | POA: Diagnosis not present

## 2023-12-31 NOTE — Telephone Encounter (Unsigned)
 Copied from CRM [id number]. Topic: Clinical - Medication Question >> Dec 31, 2023  2:03 PM Susanna ORN wrote: Reason for CRM: Patient called in stating that Dr. Severa switched her medication when she was in the office on Monday. She states that she was told by Dr. Severa that she would call the other provider to see if the sleep medication would interact with her Tikosyn. Patient and her daughter, Neldon, would like for Dr. Severa or nurse to give her a call back asap. CB #: [phone].

## 2023-12-31 NOTE — Telephone Encounter (Signed)
 Does not require preauth per Archie.

## 2023-12-31 NOTE — Telephone Encounter (Signed)
 Please review and advise.

## 2024-01-01 LAB — ECHOCARDIOGRAM COMPLETE
AR max vel: 1.09 cm2
AV Area VTI: 1.09 cm2
AV Area mean vel: 1.17 cm2
AV Mean grad: 8 mmHg
AV Peak grad: 15.5 mmHg
AV Vena cont: 0.55 cm
Ao pk vel: 1.97 m/s
Calc EF: 51.1 %
MV M vel: 5.32 m/s
MV Peak grad: 113.2 mmHg
MV VTI: 1.62 cm2
P 1/2 time: 404 ms
S' Lateral: 2.3 cm
Single Plane A2C EF: 46.7 %
Single Plane A4C EF: 51.1 %

## 2024-01-01 NOTE — Telephone Encounter (Signed)
Pt notified.    LS

## 2024-01-02 ENCOUNTER — Ambulatory Visit: Payer: Self-pay | Admitting: Cardiovascular Disease

## 2024-01-02 ENCOUNTER — Ambulatory Visit: Payer: Self-pay | Admitting: Cardiology

## 2024-01-02 NOTE — Progress Notes (Signed)
 Remote Loop Recorder Transmission

## 2024-01-04 ENCOUNTER — Encounter (HOSPITAL_COMMUNITY): Payer: Self-pay

## 2024-01-08 ENCOUNTER — Inpatient Hospital Stay (HOSPITAL_COMMUNITY)
Admission: RE | Admit: 2024-01-08 | Discharge: 2024-01-11 | DRG: 309 | Disposition: A | Discharge status: Home / Self Care | Source: Ambulatory Visit | Attending: Cardiovascular Disease | Admitting: Cardiovascular Disease

## 2024-01-08 ENCOUNTER — Telehealth (HOSPITAL_COMMUNITY): Payer: Self-pay

## 2024-01-08 ENCOUNTER — Other Ambulatory Visit: Payer: Self-pay

## 2024-01-08 ENCOUNTER — Ambulatory Visit (HOSPITAL_COMMUNITY)
Admission: RE | Admit: 2024-01-08 | Discharge: 2024-01-08 | Disposition: A | Discharge status: Home / Self Care | Source: Ambulatory Visit | Attending: Physician Assistant | Admitting: Physician Assistant

## 2024-01-08 ENCOUNTER — Telehealth (HOSPITAL_COMMUNITY): Payer: Self-pay | Admitting: Pharmacy Technician

## 2024-01-08 ENCOUNTER — Other Ambulatory Visit (HOSPITAL_COMMUNITY): Payer: Self-pay

## 2024-01-08 VITALS — BP 122/72 | HR 106 | Ht 64.0 in | Wt 140.8 lb

## 2024-01-08 DIAGNOSIS — Z79899 Other long term (current) drug therapy: Secondary | ICD-10-CM

## 2024-01-08 DIAGNOSIS — M1990 Unspecified osteoarthritis, unspecified site: Secondary | ICD-10-CM | POA: Diagnosis present

## 2024-01-08 DIAGNOSIS — D6869 Other thrombophilia: Secondary | ICD-10-CM | POA: Diagnosis present

## 2024-01-08 DIAGNOSIS — E785 Hyperlipidemia, unspecified: Secondary | ICD-10-CM | POA: Diagnosis present

## 2024-01-08 DIAGNOSIS — K219 Gastro-esophageal reflux disease without esophagitis: Secondary | ICD-10-CM | POA: Diagnosis present

## 2024-01-08 DIAGNOSIS — M545 Low back pain, unspecified: Secondary | ICD-10-CM | POA: Diagnosis present

## 2024-01-08 DIAGNOSIS — Z7901 Long term (current) use of anticoagulants: Secondary | ICD-10-CM

## 2024-01-08 DIAGNOSIS — I110 Hypertensive heart disease with heart failure: Secondary | ICD-10-CM | POA: Diagnosis present

## 2024-01-08 DIAGNOSIS — Z531 Procedure and treatment not carried out because of patient's decision for reasons of belief and group pressure: Secondary | ICD-10-CM | POA: Diagnosis present

## 2024-01-08 DIAGNOSIS — I4891 Unspecified atrial fibrillation: Secondary | ICD-10-CM

## 2024-01-08 DIAGNOSIS — H409 Unspecified glaucoma: Secondary | ICD-10-CM | POA: Diagnosis present

## 2024-01-08 DIAGNOSIS — Z86718 Personal history of other venous thrombosis and embolism: Secondary | ICD-10-CM

## 2024-01-08 DIAGNOSIS — I2510 Atherosclerotic heart disease of native coronary artery without angina pectoris: Secondary | ICD-10-CM | POA: Diagnosis present

## 2024-01-08 DIAGNOSIS — E119 Type 2 diabetes mellitus without complications: Secondary | ICD-10-CM | POA: Diagnosis present

## 2024-01-08 DIAGNOSIS — I4892 Unspecified atrial flutter: Secondary | ICD-10-CM | POA: Diagnosis present

## 2024-01-08 DIAGNOSIS — E559 Vitamin D deficiency, unspecified: Secondary | ICD-10-CM | POA: Diagnosis present

## 2024-01-08 DIAGNOSIS — F418 Other specified anxiety disorders: Secondary | ICD-10-CM | POA: Diagnosis not present

## 2024-01-08 DIAGNOSIS — F32A Depression, unspecified: Secondary | ICD-10-CM | POA: Diagnosis present

## 2024-01-08 DIAGNOSIS — I5032 Chronic diastolic (congestive) heart failure: Secondary | ICD-10-CM | POA: Diagnosis present

## 2024-01-08 DIAGNOSIS — G8929 Other chronic pain: Secondary | ICD-10-CM | POA: Diagnosis present

## 2024-01-08 DIAGNOSIS — Z79890 Hormone replacement therapy (postmenopausal): Secondary | ICD-10-CM

## 2024-01-08 DIAGNOSIS — I4819 Other persistent atrial fibrillation: Secondary | ICD-10-CM | POA: Diagnosis present

## 2024-01-08 DIAGNOSIS — Z7984 Long term (current) use of oral hypoglycemic drugs: Secondary | ICD-10-CM

## 2024-01-08 DIAGNOSIS — I25110 Atherosclerotic heart disease of native coronary artery with unstable angina pectoris: Secondary | ICD-10-CM | POA: Diagnosis not present

## 2024-01-08 DIAGNOSIS — I484 Atypical atrial flutter: Principal | ICD-10-CM | POA: Diagnosis present

## 2024-01-08 DIAGNOSIS — I252 Old myocardial infarction: Secondary | ICD-10-CM | POA: Diagnosis not present

## 2024-01-08 DIAGNOSIS — I10 Essential (primary) hypertension: Secondary | ICD-10-CM | POA: Diagnosis not present

## 2024-01-08 LAB — BASIC METABOLIC PANEL WITH GFR
BUN/Creatinine Ratio: 15 (ref 12–28)
BUN: 15 mg/dL (ref 8–27)
CO2: 30 mmol/L — ABNORMAL HIGH (ref 20–29)
Calcium: 10.2 mg/dL (ref 8.7–10.3)
Chloride: 102 mmol/L (ref 96–106)
Creatinine, Ser: 0.98 mg/dL (ref 0.57–1.00)
Glucose: 59 mg/dL — ABNORMAL LOW (ref 70–99)
Potassium: 4.4 mmol/L (ref 3.5–5.2)
Sodium: 139 mmol/L (ref 134–144)
eGFR: 57 mL/min/1.73 — ABNORMAL LOW (ref >59)

## 2024-01-08 LAB — MAGNESIUM: Magnesium: 2.2 mg/dL (ref 1.6–2.3)

## 2024-01-08 LAB — GLUCOSE, CAPILLARY: Glucose-Capillary: 124 mg/dL — ABNORMAL HIGH (ref 70–99)

## 2024-01-08 MED ORDER — LATANOPROST 0.005 % OP SOLN
1.0000 [drp] | Freq: Every day | OPHTHALMIC | Status: DC
  Administered 2024-01-08 – 2024-01-10 (×3): 1 [drp] via OPHTHALMIC
  Filled 2024-01-08: qty 2.5

## 2024-01-08 MED ORDER — METOPROLOL SUCCINATE ER 100 MG PO TB24
100.0000 mg | ORAL_TABLET | Freq: Every day | ORAL | Status: DC
  Administered 2024-01-09 – 2024-01-11 (×3): 100 mg via ORAL
  Filled 2024-01-08 (×3): qty 1

## 2024-01-08 MED ORDER — MAGNESIUM OXIDE -MG SUPPLEMENT 400 (240 MG) MG PO TABS
400.0000 mg | ORAL_TABLET | Freq: Every day | ORAL | Status: DC
  Administered 2024-01-08 – 2024-01-10 (×3): 400 mg via ORAL
  Filled 2024-01-08 (×3): qty 1

## 2024-01-08 MED ORDER — SUVOREXANT 5 MG PO TABS: 5.0000 mg | ORAL_TABLET | Freq: Every evening | ORAL | Status: DC | PRN

## 2024-01-08 MED ORDER — LEVOTHYROXINE SODIUM 50 MCG PO TABS
25.0000 ug | ORAL_TABLET | Freq: Every day | ORAL | Status: DC
  Administered 2024-01-09 – 2024-01-11 (×3): 25 ug via ORAL
  Filled 2024-01-08 (×3): qty 1

## 2024-01-08 MED ORDER — METFORMIN HCL 500 MG PO TABS
500.0000 mg | ORAL_TABLET | Freq: Two times a day (BID) | ORAL | Status: DC
  Administered 2024-01-08 – 2024-01-11 (×6): 500 mg via ORAL
  Filled 2024-01-08 (×6): qty 1

## 2024-01-08 MED ORDER — DOFETILIDE 250 MCG PO CAPS
250.0000 ug | ORAL_CAPSULE | Freq: Two times a day (BID) | ORAL | Status: DC
  Administered 2024-01-08 – 2024-01-11 (×6): 250 ug via ORAL
  Filled 2024-01-08 (×6): qty 1

## 2024-01-08 MED ORDER — LOSARTAN POTASSIUM 50 MG PO TABS
75.0000 mg | ORAL_TABLET | Freq: Every day | ORAL | Status: DC
  Administered 2024-01-09 – 2024-01-11 (×3): 75 mg via ORAL
  Filled 2024-01-08 (×3): qty 1

## 2024-01-08 MED ORDER — SODIUM CHLORIDE 0.9 % IV SOLN: 250.0000 mL | INTRAVENOUS | Status: AC | PRN

## 2024-01-08 MED ORDER — ALBUTEROL SULFATE (2.5 MG/3ML) 0.083% IN NEBU: 3.0000 mL | INHALATION_SOLUTION | RESPIRATORY_TRACT | Status: DC | PRN

## 2024-01-08 MED ORDER — VITAMIN C 500 MG PO TABS
500.0000 mg | ORAL_TABLET | Freq: Every day | ORAL | Status: DC
  Administered 2024-01-09 – 2024-01-11 (×3): 500 mg via ORAL
  Filled 2024-01-08 (×3): qty 1

## 2024-01-08 MED ORDER — MAGNESIUM CARBONATE 250 MG/GM PO POWD: Freq: Every day | ORAL | Status: DC

## 2024-01-08 MED ORDER — NITROGLYCERIN 0.4 MG SL SUBL: 0.4000 mg | SUBLINGUAL_TABLET | SUBLINGUAL | Status: DC | PRN

## 2024-01-08 MED ORDER — ACETAMINOPHEN 500 MG PO TABS: 1000.0000 mg | ORAL_TABLET | Freq: Four times a day (QID) | ORAL | Status: DC | PRN

## 2024-01-08 MED ORDER — CHOLECALCIFEROL 10 MCG (400 UNIT) PO TABS
400.0000 [IU] | ORAL_TABLET | Freq: Every day | ORAL | Status: DC
  Administered 2024-01-09 – 2024-01-11 (×3): 400 [IU] via ORAL
  Filled 2024-01-08 (×3): qty 1

## 2024-01-08 MED ORDER — HYDROCODONE-ACETAMINOPHEN 7.5-325 MG PO TABS
1.0000 | ORAL_TABLET | Freq: Four times a day (QID) | ORAL | Status: DC | PRN
  Administered 2024-01-09: 1 via ORAL
  Filled 2024-01-08: qty 1

## 2024-01-08 MED ORDER — DULOXETINE HCL 60 MG PO CPEP
60.0000 mg | ORAL_CAPSULE | Freq: Every day | ORAL | Status: DC
  Administered 2024-01-09 – 2024-01-11 (×3): 60 mg via ORAL
  Filled 2024-01-08 (×3): qty 1

## 2024-01-08 MED ORDER — SODIUM CHLORIDE 0.9% FLUSH
3.0000 mL | INTRAVENOUS | Status: DC | PRN
Start: 2024-01-08 — End: 2024-01-11

## 2024-01-08 MED ORDER — TIMOLOL MALEATE 0.5 % OP SOLN
1.0000 [drp] | Freq: Every day | OPHTHALMIC | Status: DC
  Administered 2024-01-09 – 2024-01-11 (×3): 1 [drp] via OPHTHALMIC
  Filled 2024-01-08: qty 5

## 2024-01-08 MED ORDER — CALCIUM CARBONATE ANTACID 500 MG PO CHEW: 1.0000 | CHEWABLE_TABLET | Freq: Four times a day (QID) | ORAL | Status: DC | PRN

## 2024-01-08 MED ORDER — DILTIAZEM HCL ER COATED BEADS 180 MG PO CP24
300.0000 mg | ORAL_CAPSULE | Freq: Every day | ORAL | Status: DC
  Administered 2024-01-09 – 2024-01-11 (×3): 300 mg via ORAL
  Filled 2024-01-08 (×3): qty 1

## 2024-01-08 MED ORDER — DILTIAZEM HCL 60 MG PO TABS: 60.0000 mg | ORAL_TABLET | Freq: Three times a day (TID) | ORAL | Status: DC | PRN

## 2024-01-08 MED ORDER — APIXABAN 5 MG PO TABS
5.0000 mg | ORAL_TABLET | Freq: Two times a day (BID) | ORAL | Status: DC
  Administered 2024-01-08 – 2024-01-11 (×6): 5 mg via ORAL
  Filled 2024-01-08 (×6): qty 1

## 2024-01-08 MED ORDER — SODIUM CHLORIDE 0.9% FLUSH
3.0000 mL | Freq: Two times a day (BID) | INTRAVENOUS | Status: DC
  Administered 2024-01-08 – 2024-01-11 (×5): 3 mL via INTRAVENOUS

## 2024-01-08 MED ORDER — ROSUVASTATIN CALCIUM 20 MG PO TABS
20.0000 mg | ORAL_TABLET | Freq: Every day | ORAL | Status: DC
  Administered 2024-01-09 – 2024-01-11 (×3): 20 mg via ORAL
  Filled 2024-01-08 (×3): qty 1

## 2024-01-08 NOTE — Telephone Encounter (Signed)
 Pharmacy Patient Advocate Encounter  Insurance verification completed.    The patient is insured through [facility]. Patient has Medicare and is not eligible for a copay card, but may be able to apply for patient assistance or Medicare RX Payment Plan (Patient Must reach out to their plan, if eligible for payment plan), if available.    Ran test claim for Eliquis  5mg  and it is refill too soon until 01/16/2024   This test claim was processed through [facility]- copay amounts may vary at other pharmacies due to pharmacy/plan contracts, or as the patient moves through the different stages of their insurance plan.

## 2024-01-08 NOTE — Telephone Encounter (Signed)
 Patient Product/process Development Scientist completed.    The patient is insured through [facility]. Patient has Medicare and is not eligible for a copay card, but may be able to apply for patient assistance or Medicare RX Payment Plan (Patient Must reach out to their plan, if eligible for payment plan), if available.    Ran test claim for dofetilide  (Tikosyn ) 500 mcg and the current 30 day co-pay is $0.00.   This test claim was processed through [facility]- copay amounts may vary at other pharmacies due to pharmacy/plan contracts, or as the patient moves through the different stages of their insurance plan.     Reyes Sharps, CPHT Pharmacy Technician Patient Advocate Specialist Lead [facility] Patient Advocate Team Direct Number: [phone]  Fax: [phone]

## 2024-01-08 NOTE — Inpatient Diabetes Management (Signed)
 Inpatient Diabetes Program Recommendations  AACE/ADA: New Consensus Statement on Inpatient Glycemic Control (2015)  Target Ranges:  Prepandial:   less than 140 mg/dL      Peak postprandial:   less than 180 mg/dL (1-2 hours)      Critically ill patients:  140 - 180 mg/dL   Lab Results  Component Value Date   GLUCAP 124 (H) 01/08/2024   HGBA1C 5.9 (H) 10/16/2023    Review of Glycemic Control  Diabetes history: DM2 Outpatient Diabetes medications: metformin  500 mg BID Current orders for Inpatient glycemic control: metformin  500 mg BID  HgbA1C - 5.9%  Inpatient Diabetes Program Recommendations:    Consider adding Novolog  0-6 TID with meals  Follow.  Thank you. Shona Brandy, RD, LDN, CDCES Inpatient Diabetes Coordinator [phone]

## 2024-01-08 NOTE — Progress Notes (Signed)
 Pharmacy: Dofetilide (Tikosyn) - Initial Consult Assessment and Electrolyte Replacement  Pharmacy consulted to assist in monitoring and replacing electrolytes in this 83 y.o. female admitted on 01/08/2024 undergoing dofetilide initiation. First dofetilide dose: 01/08/24  Assessment:  Patient Exclusion Criteria: If any screening criteria checked as Yes, then  patient  should NOT receive dofetilide until criteria item is corrected.  If "Yes" please indicate correction plan.  YES  NO Patient  Exclusion Criteria Correction Plan/Comments   []   [x]   Baseline QTc interval is greater than or equal to 440 msec. IF above YES box checked dofetilide contraindicated unless patient has ICD; then may proceed if QTc 500-550 msec or with known ventricular conduction abnormalities may proceed with QTc 550-600 msec. QTc = 428    []   [x]   Patient is known or suspected to have a digoxin level greater than 2 ng/ml: No results found for: DIGOXIN     []   [x]   Creatinine clearance less than 20 ml/min (calculated using Cockcroft-Gault, actual body weight and serum creatinine): Estimated Creatinine Clearance: 37.6 mL/min (by C-G formula based on SCr of 0.98 mg/dL).     []   [x]  Patient has received drugs known to prolong the QT intervals within the last 48 hour (examples: phenothiazines, tricyclics or tetracyclic antidepressants, macrolides, 1st generation H-1 antihistamines (especially diphenhydramine), fluoroquinolones, azoles, ondansetron , metoclopramide, promethazine).   Updated information on QT prolonging agents is available to be searched on the following database:QT prolonging agents -If SSRI or antihistamine needed, preferred options are sertraline and loratadine respectively     []   [x]  Patient received a dose of a thiazide diuretic in the last 48 hours [including hydrochlorothiazide  (Oretic ) alone or in any combination including triamterene  (Dyazide , Maxzide )].    []   [x]  Patient received  a medication known to increase dofetilide plasma concentrations prior to initial dofetilide dose:  Trimethoprim (Primsol, Proloprim) in the last 36 hours Verapamil  (Calan , Verelan ) in the last 36 hours or a sustained release dose in the last 72 hours Megestrol (Megace) in the last 5 days  Cimetidine (Tagamet) in the last 6 hours Ketoconazole (Nizoral) in the last 24 hours Itraconazole (Sporanox) in the last 48 hours  Prochlorperazine (Compazine) in the last 36 hours  Metformin  - generally ok to monitor   []   [x]   Patient is known to have a history of torsades de pointes; congenital or acquired long QT syndromes.    []   [x]   Patient has received a Class 1 and Class 3 antiarrhythmic with less than 2 half-lives since last dose. (Disopyramide, Quinidine, Procainamide, Lidocaine , Mexiletine, Flecainide, Propafenone, Sotalol, Dronedarone)    []   [x]   Patient has received amiodarone  therapy in the past 3 months or amiodarone  level is greater than 0.3 ng/ml.    Labs:    Component Value Date/Time   K 4.4 01/08/2024 0915   MG 2.2 01/08/2024 0915     Plan: Select One Calculated CrCl  Dose q12h  []  > 60 ml/min 500 mcg  [x]  40-60 ml/min 250 mcg  []  20-40 ml/min 125 mcg   [x]   Physician selected initial dose within range recommended for patients level of renal function - will monitor for response.  []   Physician selected initial dose outside of range recommended for patients level of renal function - will discuss if the dose should be altered at this time.   Patient has been appropriately anticoagulated with apixaban  5mg  BID.  Potassium: K >/= 4: Appropriate to initiate Tikosyn, no replacement needed    Magnesium :  Mg >2: Appropriate to initiate Tikosyn, no replacement needed     Thank you for allowing pharmacy to participate in this patient's care   Ozell ONEIDA [place] 01/08/2024  2:06 PM

## 2024-01-08 NOTE — H&P (Signed)
 Primary Care Physician: Severa Rock HERO, FNP Primary Cardiologist: Dr Debera Primary Electrophysiologist: Dr Nancey  Referring Physician: Dr Debera Pagan [name] is a 83 y.o. female with a history of CAD, HTN, DM, HLD, CHF, atrial flutter, and persistent atrial fibrillation who presents for follow up in the [facility]. The patient was initially diagnosed with atrial fibrillation on 11/2017 after presenting with symptoms of palpitations and chest pain to ER and found to be in afib with RVR. She was discharged with Eliquis  and rate control with plan to cardiovert on f/u but she had converted on her own. She denies snoring or significant alcohol use. Patient was seen at the ER 11/11/18 with symptoms of SOB, chest tightness, and palpitations for 3 days. She was found to be in afib with RVR and underwent successful DCCV. Zio patch prior to this episode showed no afib but did show very frequent short episodes of SVT. She was started on amiodarone  which controlled her afib but unfortunately she developed hypothyroidism and this was discontinued.   Patient is s/p afib ablation with Dr Kelsie on 02/16/21. There were multiple atypical atrial flutter circuits noted. Post ablation, she had an increase in edema in her right leg, going up to her mid thigh. She started Lasix  daily with only minimal improvement. She reports compliance with Eliquis  and Plavix . Venous u/s showed DVT in R common femoral vein. She saw Dr Sheree and recommendation was to continue Eliquis  and use compression stockings with no other intervention.   She was seen at the ED 12/11/23 with symptoms of palpitations. ECG suspicious for atrial flutter at that time. Pro BNP elevated at 2634. Her ILR shows 1.6% afib burden but suspect undersensing based on heart rate trends.   Patient returns for follow up for atrial fibrillation and atrial flutter. She presents for dofetilide admission. She remains in atrial flutter  with symptoms of fatigue. She denies any missed doses of anticoagulation in the past 3 weeks.   Today, she  denies symptoms of palpitations, chest pain, shortness of breath, orthopnea, PND, lower extremity edema, dizziness, presyncope, syncope, snoring, daytime somnolence, bleeding, or neurologic sequela. The patient is tolerating medications without difficulties and is otherwise without complaint today.    Atrial Fibrillation Risk Factors:  she does not have symptoms or diagnosis of sleep apnea. she does not have a history of rheumatic fever. she does not have a history of alcohol use. The patient does not have a history of early familial atrial fibrillation or other arrhythmias.   Atrial Fibrillation Management history:  Previous antiarrhythmic drugs: amiodarone   Previous cardioversions: 11/11/18 Previous ablations: 02/16/21 Anticoagulation history: Eliquis    Past Medical History:  Diagnosis Date   Anxiety    Arthritis    Bulging lumbar disc    Chronic lower back pain    Coronary atherosclerosis of native coronary artery    DES LAD 02/2008, DES LAD 02/2015, DES ramus and DES x2 mid RCA 02/2020   Depression    Dyslipidemia    Essential hypertension    Facial numbness    Frequent headaches    GERD (gastroesophageal reflux disease)    Glaucoma    Heart attack (HCC) 02/2020   Insomnia    Paroxysmal atrial fibrillation (HCC)    Diagnosed October 2019   PSVT (paroxysmal supraventricular tachycardia)    Refusal of blood transfusions as patient is Jehovah's Witness    Thyroid  disease    Type 2 diabetes mellitus (HCC)  Borderline   Vitamin D  deficiency      Current Facility-Administered Medications  Medication Dose Route Frequency Provider Last Rate Last Admin   0.9 %  sodium chloride  infusion  250 mL Intravenous PRN Mealor, Augustus E, MD       acetaminophen  (TYLENOL ) tablet 1,000 mg  1,000 mg Oral Q6H PRN Fenton, Clint R, PA       albuterol  (PROVENTIL ) (2.5 MG/3ML)  0.083% nebulizer solution 3 mL  3 mL Nebulization Q4H PRN Fenton, Clint R, PA       apixaban  (ELIQUIS ) tablet 5 mg  5 mg Oral BID Mealor, Augustus E, MD       [START ON 01/09/2024] ascorbic acid  (VITAMIN C ) tablet 500 mg  500 mg Oral Daily Fenton, Clint R, PA       calcium  carbonate (TUMS - dosed in mg elemental calcium ) chewable tablet 200 mg of elemental calcium   1 tablet Oral QID PRN Fenton, Clint R, PA       [START ON 01/09/2024] cholecalciferol  (VITAMIN D3) 10 MCG (400 UNIT) tablet 400 Units  400 Units Oral Daily Fenton, Clint R, PA       [START ON 01/09/2024] diltiazem  (CARDIZEM  CD) 24 hr capsule 300 mg  300 mg Oral Daily Fenton, Clint R, PA       diltiazem  (CARDIZEM ) tablet 60 mg  60 mg Oral TID PRN Fenton, Clint R, PA       dofetilide (TIKOSYN) capsule 250 mcg  250 mcg Oral BID Mealor, Augustus E, MD       [START ON 01/09/2024] DULoxetine  (CYMBALTA ) DR capsule 60 mg  60 mg Oral Daily Fenton, Clint R, PA       latanoprost  (XALATAN ) 0.005 % ophthalmic solution 1 drop  1 drop Both Eyes QHS Fenton, Clint R, PA       [START ON 01/09/2024] levothyroxine  (SYNTHROID ) tablet 25 mcg  25 mcg Oral QAC breakfast Fenton, Clint R, PA       [START ON 01/09/2024] losartan  (COZAAR ) tablet 75 mg  75 mg Oral Daily Fenton, Clint R, PA       magnesium  oxide (MAG-OX) tablet 400 mg  400 mg Oral QHS Bitonti, Michael T, RPH       metFORMIN  (GLUCOPHAGE ) tablet 500 mg  500 mg Oral BID WC Fenton, Clint R, PA       [START ON 01/09/2024] metoprolol  succinate (TOPROL -XL) 24 hr tablet 100 mg  100 mg Oral Daily Fenton, Clint R, PA       nitroGLYCERIN  (NITROSTAT ) SL tablet 0.4 mg  0.4 mg Sublingual Q5 min PRN Fenton, Clint R, PA       [START ON 01/09/2024] rosuvastatin  (CRESTOR ) tablet 20 mg  20 mg Oral Daily Fenton, Clint R, PA       sodium chloride  flush (NS) 0.9 % injection 3 mL  3 mL Intravenous Q12H Mealor, Augustus E, MD       sodium chloride  flush (NS) 0.9 % injection 3 mL  3 mL Intravenous PRN Mealor, Augustus E, MD        [START ON 01/09/2024] timolol  (TIMOPTIC ) 0.5 % ophthalmic solution 1 drop  1 drop Both Eyes Daily Fenton, Clint R, PA        ROS- All systems are reviewed and negative except as per the HPI above.  Physical Exam: Vitals:   01/08/24 1415  BP: (!) 137/53  Pulse: 62  Resp: 16  Temp: 97.6 F (36.4 C)  TempSrc: Oral  SpO2: 100%  Weight: 64.3 kg  Height: 5' 4.02 (1.626 m)     GEN: Well nourished, well developed in no acute distress CARDIAC: Regular rate and rhythm, tachycardia, no murmurs, rubs, gallops RESPIRATORY:  Clear to auscultation without rales, wheezing or rhonchi  ABDOMEN: Soft, non-tender, non-distended EXTREMITIES:  No edema; No deformity    Wt Readings from Last 3 Encounters:  01/08/24 64.3 kg  01/08/24 63.9 kg  12/28/23 64.6 kg    EKG today demonstrates  Atypical atrial flutter with 2:1 block Vent. rate 106 BPM PR interval 116 ms QRS duration 74 ms QT/QTcB 342/454 ms   Echo 12/31/23 demonstrated   1. Left ventricular ejection fraction, by estimation, is 55 to 60%. The  left ventricle has normal function. The left ventricle has no regional  wall motion abnormalities. Left ventricular diastolic parameters are  indeterminate.   2. Right ventricular systolic function is low normal. The right  ventricular size is normal. There is moderately elevated pulmonary artery  systolic pressure.   3. The mitral valve is abnormal. Mild mitral valve regurgitation. Mild  mitral stenosis. The mean mitral valve gradient is 5.0 mmHg.Heart rate 102  bpm. Moderate mitral annular calcification.   4. The tricuspid valve is abnormal. Tricuspid valve regurgitation is  moderate.   5. The aortic valve is tricuspid. There is mild calcification of the  aortic valve. There is mild thickening of the aortic valve. Aortic valve  regurgitation is moderate. No aortic stenosis is present.   6. The inferior vena cava is normal in size with greater than 50%  respiratory variability,  suggesting right atrial pressure of 3 mmHg.   Comparison(s): A prior study was performed on 11//22/2023. EF 55-60%. RV  systolic function mildly reduced. Severe MAC. Moderate MR. Mild mitral  stenosis. Moderate calcification of the aortic valve. Mild AS. Mild to  moderate AI.    Epic records are reviewed at length today   CHA2DS2-VASc Score = 7  The patient's score is based upon: CHF History: 1 HTN History: 1 Diabetes History: 1 Stroke History: 0 Vascular Disease History: 1 Age Score: 2 Gender Score: 1       ASSESSMENT AND PLAN: Persistent Atrial Fibrillation/atrial flutter (ICD10:  I48.19) The patient's CHA2DS2-VASc score is 7, indicating a 11.2% annual risk of stroke.   Patient remains in atrial flutter, symptomatic. Recall previously failed amiodarone  due to thyroid  side effects.  Patient presents for dofetilide admission Continue Eliquis  5 mg BID, states no missed doses in the last 3 weeks. No recent benadryl use PharmD has screened medications for QT prolonging agents. She has discontinued trazodone .  QTc in SR 428 ms Labs today show creatinine at 0.98, K+ 4.4 and mag 2.2, CrCl calculated at 44 mL/min Continue diltiazem  300 mg daily Continue Toprol  100 mg daily  Secondary Hypercoagulable State (ICD10:  D68.69) The patient is at significant risk for stroke/thromboembolism based upon her CHA2DS2-VASc Score of 7.  Continue Apixaban  (Eliquis ). No bleeding issues.   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    Daril Kicks PA-C Afib Clinic [facility] [address] [place], [place] [address] [phone] 01/08/2024 4:31 PM  AFib clinic note to serve as H&P Patient has arrived for Tikosyn admission, rate controlled AFlutter Dr. Nancey has  been bedside, reviewed EKG, chart Tikosyn load to start tonight, 250mcg  BID Labs stable Pt confirms no missed doses of Eliquis  in the last 3 weeks, longer.  Charlies Arthur, PA-C

## 2024-01-08 NOTE — Progress Notes (Signed)
 Primary Care Physician: Severa Rock HERO, FNP Primary Cardiologist: Dr Debera Primary Electrophysiologist: Dr Nancey  Referring Physician: Dr Debera Pagan [name] is a 83 y.o. female with a history of CAD, HTN, DM, HLD, CHF, atrial flutter, and persistent atrial fibrillation who presents for follow up in the [facility]. The patient was initially diagnosed with atrial fibrillation on 11/2017 after presenting with symptoms of palpitations and chest pain to ER and found to be in afib with RVR. She was discharged with Eliquis  and rate control with plan to cardiovert on f/u but she had converted on her own. She denies snoring or significant alcohol use. Patient was seen at the ER 11/11/18 with symptoms of SOB, chest tightness, and palpitations for 3 days. She was found to be in afib with RVR and underwent successful DCCV. Zio patch prior to this episode showed no afib but did show very frequent short episodes of SVT. She was started on amiodarone  which controlled her afib but unfortunately she developed hypothyroidism and this was discontinued.   Patient is s/p afib ablation with Dr Kelsie on 02/16/21. There were multiple atypical atrial flutter circuits noted. Post ablation, she had an increase in edema in her right leg, going up to her mid thigh. She started Lasix  daily with only minimal improvement. She reports compliance with Eliquis  and Plavix . Venous u/s showed DVT in R common femoral vein. She saw Dr Sheree and recommendation was to continue Eliquis  and use compression stockings with no other intervention.   She was seen at the ED 12/11/23 with symptoms of palpitations. ECG suspicious for atrial flutter at that time. Pro BNP elevated at 2634. Her ILR shows 1.6% afib burden but suspect undersensing based on heart rate trends.   Patient returns for follow up for atrial fibrillation and atrial flutter. She presents for dofetilide admission. She remains in atrial flutter  with symptoms of fatigue. She denies any missed doses of anticoagulation in the past 3 weeks.   Today, she  denies symptoms of palpitations, chest pain, shortness of breath, orthopnea, PND, lower extremity edema, dizziness, presyncope, syncope, snoring, daytime somnolence, bleeding, or neurologic sequela. The patient is tolerating medications without difficulties and is otherwise without complaint today.    Atrial Fibrillation Risk Factors:  she does not have symptoms or diagnosis of sleep apnea. she does not have a history of rheumatic fever. she does not have a history of alcohol use. The patient does not have a history of early familial atrial fibrillation or other arrhythmias.   Atrial Fibrillation Management history:  Previous antiarrhythmic drugs: amiodarone   Previous cardioversions: 11/11/18 Previous ablations: 02/16/21 Anticoagulation history: Eliquis    Past Medical History:  Diagnosis Date   Anxiety    Arthritis    Bulging lumbar disc    Chronic lower back pain    Coronary atherosclerosis of native coronary artery    DES LAD 02/2008, DES LAD 02/2015, DES ramus and DES x2 mid RCA 02/2020   Depression    Dyslipidemia    Essential hypertension    Facial numbness    Frequent headaches    GERD (gastroesophageal reflux disease)    Glaucoma    Heart attack (HCC) 02/2020   Insomnia    Paroxysmal atrial fibrillation (HCC)    Diagnosed October 2019   PSVT (paroxysmal supraventricular tachycardia)    Refusal of blood transfusions as patient is Jehovah's Witness    Thyroid  disease    Type 2 diabetes mellitus (HCC)  Borderline   Vitamin D  deficiency      Current Outpatient Medications  Medication Sig Dispense Refill   acetaminophen  (TYLENOL ) 500 MG tablet Take 1,000 mg by mouth every 6 (six) hours as needed for moderate pain. (Patient taking differently: Take 1,000 mg by mouth as needed for moderate pain (pain score 4-6).)     Ascorbic Acid  (VITAMIN C ) 500 MG tablet Take  500 mg by mouth daily.       bimatoprost (LUMIGAN) 0.01 % SOLN Place 1 drop into both eyes at bedtime.     Calcium  Carbonate Antacid (TUMS PO) Take 2 tablets by mouth 4 (four) times daily as needed (for acid reflux/indigestion).  (Patient taking differently: Take 2 tablets by mouth as needed (for acid reflux/indigestion).)     Camphor-Menthol -Methyl Sal (SALONPAS ) 3.02-26-08 % PTCH Apply 1 patch topically daily as needed (pain). (Patient taking differently: Apply 1 patch topically as needed (pain).)     cetirizine (ZYRTEC) 10 MG tablet Take 10 mg by mouth daily as needed for allergies. (Patient taking differently: Take 10 mg by mouth as needed for allergies.)     Cyanocobalamin  (VITAMIN B 12 PO) Take 1 tablet by mouth daily.     diclofenac  Sodium (VOLTAREN ) 1 % GEL Apply 2 g topically 4 (four) times daily. 50 g 2   diltiazem  (CARDIZEM  CD) 300 MG 24 hr capsule Take 1 capsule (300 mg total) by mouth daily. 90 capsule 2   diltiazem  (CARDIZEM ) 60 MG tablet Take 1 every 8 hours as needed for fast heart rate and palpitations 60 tablet 1   DULoxetine  (CYMBALTA ) 60 MG capsule Take 1 capsule (60 mg total) by mouth daily. 90 capsule 1   ELIQUIS  5 MG TABS tablet TAKE ONE TABLET BY MOUTH TWICE DAILY 60 tablet 5   GARLIC  PO Take 1 tablet by mouth daily.     HYDROcodone -acetaminophen  (NORCO) 7.5-325 MG tablet Take 1 tablet by mouth every 6 (six) hours as needed for moderate pain (pain score 4-6). 30 tablet 0   lidocaine  (HM LIDOCAINE  PATCH) 4 % Place 2 patches onto the skin daily. (Patient taking differently: Place 2 patches onto the skin as needed.) 10 patch 0   losartan  (COZAAR ) 50 MG tablet Take 1.5 tablets (75 mg total) by mouth daily. 135 tablet 3   MAGNESIUM  CARBONATE PO Take 1 tablet by mouth at bedtime.     metFORMIN  (GLUCOPHAGE ) 500 MG tablet Take 1 tablet (500 mg total) by mouth 2 (two) times daily with a meal. 180 tablet 0   metoprolol  succinate (TOPROL -XL) 100 MG 24 hr tablet TAKE 1 TABLET BY MOUTH  DAILY WITH FOOD 90 tablet 1   nitroGLYCERIN  (NITROSTAT ) 0.4 MG SL tablet DISSOLVE 1 TAB UNDER TOUNGE FOR CHEST PAIN. MAY REPEAT EVERY 5 MINUTES FOR 3 DOSES. IF NO RELIEF CALL 911 OR GO TO ER 25 tablet 2   rosuvastatin  (CRESTOR ) 20 MG tablet TAKE ONE (1) TABLET BY MOUTH EVERY DAY 90 tablet 1   SPIKEVAX syringe      Suvorexant (BELSOMRA) 5 MG TABS Take 1 tablet (5 mg total) by mouth at bedtime as needed. 30 tablet 0   timolol  (TIMOPTIC ) 0.5 % ophthalmic solution Place 1 drop into both eyes daily.     TURMERIC PO Take 1 tablet by mouth daily.     VENTOLIN  HFA 108 (90 Base) MCG/ACT inhaler Inhale 1-2 puffs into the lungs every 4 (four) hours as needed for shortness of breath. (Patient taking differently: Inhale 1-2 puffs into the lungs  as needed for shortness of breath.) 18 g 1   Vitamin D , Cholecalciferol , 10 MCG (400 UNIT) CAPS Take 400 Units by mouth daily.     levothyroxine  (SYNTHROID ) 25 MCG tablet Take 1 tablet (25 mcg total) by mouth daily before breakfast. 90 tablet 3   No current facility-administered medications for this encounter.    ROS- All systems are reviewed and negative except as per the HPI above.  Physical Exam: Vitals:   01/08/24 0902  BP: 122/72  Pulse: (!) 106  Weight: 63.9 kg  Height: 5' 4 (1.626 m)     GEN: Well nourished, well developed in no acute distress CARDIAC: Regular rate and rhythm, tachycardia, no murmurs, rubs, gallops RESPIRATORY:  Clear to auscultation without rales, wheezing or rhonchi  ABDOMEN: Soft, non-tender, non-distended EXTREMITIES:  No edema; No deformity    Wt Readings from Last 3 Encounters:  01/08/24 63.9 kg  12/28/23 64.6 kg  12/28/23 64.6 kg    EKG today demonstrates  Atypical atrial flutter with 2:1 block Vent. rate 106 BPM PR interval 116 ms QRS duration 74 ms QT/QTcB 342/454 ms   Echo 12/31/23 demonstrated   1. Left ventricular ejection fraction, by estimation, is 55 to 60%. The  left ventricle has normal function.  The left ventricle has no regional  wall motion abnormalities. Left ventricular diastolic parameters are  indeterminate.   2. Right ventricular systolic function is low normal. The right  ventricular size is normal. There is moderately elevated pulmonary artery  systolic pressure.   3. The mitral valve is abnormal. Mild mitral valve regurgitation. Mild  mitral stenosis. The mean mitral valve gradient is 5.0 mmHg.Heart rate 102  bpm. Moderate mitral annular calcification.   4. The tricuspid valve is abnormal. Tricuspid valve regurgitation is  moderate.   5. The aortic valve is tricuspid. There is mild calcification of the  aortic valve. There is mild thickening of the aortic valve. Aortic valve  regurgitation is moderate. No aortic stenosis is present.   6. The inferior vena cava is normal in size with greater than 50%  respiratory variability, suggesting right atrial pressure of 3 mmHg.   Comparison(s): A prior study was performed on 11//22/2023. EF 55-60%. RV  systolic function mildly reduced. Severe MAC. Moderate MR. Mild mitral  stenosis. Moderate calcification of the aortic valve. Mild AS. Mild to  moderate AI.    Epic records are reviewed at length today   CHA2DS2-VASc Score = 7  The patient's score is based upon: CHF History: 1 HTN History: 1 Diabetes History: 1 Stroke History: 0 Vascular Disease History: 1 Age Score: 2 Gender Score: 1       ASSESSMENT AND PLAN: Persistent Atrial Fibrillation/atrial flutter (ICD10:  I48.19) The patient's CHA2DS2-VASc score is 7, indicating a 11.2% annual risk of stroke.   Patient remains in atrial flutter, symptomatic. Recall previously failed amiodarone  due to thyroid  side effects.  Patient presents for dofetilide admission Continue Eliquis  5 mg BID, states no missed doses in the last 3 weeks. No recent benadryl use PharmD has screened medications for QT prolonging agents. She has discontinued trazodone .  QTc in SR 428 ms Labs  today show creatinine at 0.98, K+ 4.4 and mag 2.2, CrCl calculated at 44 mL/min Continue diltiazem  300 mg daily Continue Toprol  100 mg daily  Secondary Hypercoagulable State (ICD10:  D68.69) The patient is at significant risk for stroke/thromboembolism based upon her CHA2DS2-VASc Score of 7.  Continue Apixaban  (Eliquis ). No bleeding issues.   CAD S/p several  stents No anginal symptoms Followed by Debera  HTN Stable on current regimen  Chronic HFpEF EF 55-60% GDMT per primary cardiology team Fluid status appears stable today   To be admitted later today once a bed becomes available.    Daril Kicks PA-C [facility] [address] [place], [place] [address] [phone] 01/08/2024 9:22 AM

## 2024-01-08 NOTE — Progress Notes (Signed)
 Glucose finger stick upon admission is 124.

## 2024-01-09 ENCOUNTER — Other Ambulatory Visit: Payer: Self-pay

## 2024-01-09 ENCOUNTER — Encounter (HOSPITAL_COMMUNITY): Payer: Self-pay | Admitting: Cardiovascular Disease

## 2024-01-09 DIAGNOSIS — I2510 Atherosclerotic heart disease of native coronary artery without angina pectoris: Secondary | ICD-10-CM

## 2024-01-09 DIAGNOSIS — I10 Essential (primary) hypertension: Secondary | ICD-10-CM

## 2024-01-09 DIAGNOSIS — I4819 Other persistent atrial fibrillation: Secondary | ICD-10-CM | POA: Diagnosis not present

## 2024-01-09 LAB — BASIC METABOLIC PANEL WITH GFR
Anion gap: 10 (ref 5–15)
BUN: 11 mg/dL (ref 8–23)
CO2: 28 mmol/L (ref 22–32)
Calcium: 9.4 mg/dL (ref 8.9–10.3)
Chloride: 99 mmol/L (ref 98–111)
Creatinine, Ser: 0.99 mg/dL (ref 0.44–1.00)
GFR, Estimated: 57 mL/min — ABNORMAL LOW (ref >60)
Glucose, Bld: 119 mg/dL — ABNORMAL HIGH (ref 70–99)
Potassium: 5 mmol/L (ref 3.5–5.1)
Sodium: 137 mmol/L (ref 135–145)

## 2024-01-09 LAB — MAGNESIUM: Magnesium: 2.1 mg/dL (ref 1.7–2.4)

## 2024-01-09 MED ORDER — SODIUM CHLORIDE 0.9 % IV SOLN: INTRAVENOUS | Status: DC

## 2024-01-09 NOTE — Plan of Care (Signed)

## 2024-01-09 NOTE — Progress Notes (Signed)
 Post dose EKG is reviewed Remains in AFlutter, variable conduction. QT is difficult to measure, machine read , appears stable. Continue Tikosyn load NPO after MN for DCCV if needed  Charlies Arthur, PA-C

## 2024-01-09 NOTE — Progress Notes (Signed)
 Mobility Specialist Progress Note;   01/09/24 0916  Mobility  Activity Ambulated with assistance  Level of Assistance Standby assist, set-up cues, supervision of patient - no hands on  Assistive Device Front wheel walker  Distance Ambulated (ft) 300 ft  Activity Response Tolerated well  Mobility Referral Yes  Mobility visit 1 Mobility  Mobility Specialist Start Time (ACUTE ONLY) [id number]  Mobility Specialist Stop Time (ACUTE ONLY) [id number]  Mobility Specialist Time Calculation (min) (ACUTE ONLY) 12 min   Pt eager for mobility. Required no physical assistance during ambulation, SV for safety. HR up to 100 bpm w/ exertion. C/o overall fatigue, otherwise asx. Pt returned back to bed and left with all needs met.   Lauraine Erm Mobility Specialist Please contact via SecureChat or [employer] [phone]

## 2024-01-09 NOTE — Anesthesia Preprocedure Evaluation (Signed)
 Anesthesia Evaluation  Patient identified by MRN, date of birth, ID band Patient awake    Reviewed: Allergy & Precautions, NPO status , Patient's Chart, lab work & pertinent test results  History of Anesthesia Complications Negative for: history of anesthetic complications  Airway Mallampati: II  TM Distance: >3 FB Neck ROM: Full    Dental no notable dental hx. (+) Edentulous Upper, Dental Advisory Given   Pulmonary    Pulmonary exam normal breath sounds clear to auscultation       Cardiovascular hypertension, Pt. on home beta blockers and Pt. on medications (-) angina + CAD and + Cardiac Stents (2017)  Normal cardiovascular exam+ dysrhythmias Atrial Fibrillation + Valvular Problems/Murmurs AS and MR  Rhythm:Irregular Rate:Abnormal  Echo 12/31/23 demonstrated   1. Left ventricular ejection fraction, by estimation, is 55 to 60%. The  left ventricle has normal function. The left ventricle has no regional  wall motion abnormalities. Left ventricular diastolic parameters are  indeterminate.   2. Right ventricular systolic function is low normal. The right  ventricular size is normal. There is moderately elevated pulmonary artery  systolic pressure.   3. The mitral valve is abnormal. Mild mitral valve regurgitation. Mild  mitral stenosis. The mean mitral valve gradient is 5.0 mmHg.Heart rate 102  bpm. Moderate mitral annular calcification.   4. The tricuspid valve is abnormal. Tricuspid valve regurgitation is  moderate.   5. The aortic valve is tricuspid. There is mild calcification of the  aortic valve. There is mild thickening of the aortic valve. Aortic valve  regurgitation is moderate. No aortic stenosis is present.   6. The inferior vena cava is normal in size with greater than 50%  respiratory variability, suggesting right atrial pressure of 3 mmHg.   Comparison(s): A prior study was performed on 11//22/2023. EF 55-60%. RV   systolic function mildly reduced. Severe MAC. Moderate MR. Mild mitral  stenosis. Moderate calcification of the aortic valve. Mild AS. Mild to  moderate AI.         Neuro/Psych  Headaches PSYCHIATRIC DISORDERS Anxiety Depression       GI/Hepatic ,GERD  ,,  Endo/Other  diabetes, Type 2Hypothyroidism    Renal/GU      Musculoskeletal   Abdominal   Peds  Hematology   Anesthesia Other Findings   Reproductive/Obstetrics                              Anesthesia Physical Anesthesia Plan  ASA: 3  Anesthesia Plan: General   Post-op Pain Management: Minimal or no pain anticipated   Induction:   PONV Risk Score and Plan: Propofol  infusion and Treatment may vary due to age or medical condition  Airway Management Planned: Mask  Additional Equipment: None  Intra-op Plan:   Post-operative Plan:   Informed Consent: I have reviewed the patients History and Physical, chart, labs and discussed the procedure including the risks, benefits and alternatives for the proposed anesthesia with the patient or authorized representative who has indicated his/her understanding and acceptance.     Dental advisory given  Plan Discussed with: CRNA and Surgeon  Anesthesia Plan Comments:          Anesthesia Quick Evaluation

## 2024-01-09 NOTE — TOC CM/SW Note (Signed)
 Transition of Care [facility]) - Inpatient Brief Assessment   Patient Details  Name: [name] MRN: [id number] Date of Birth: 1940-09-19  Transition of Care [facility] CM/SW Contact:    Sudie Erminio Deems, RN Phone Number: 01/09/2024, 12:44 PM   Clinical Narrative: Patient presented for Tikosyn Load. Inpatient Case Manager spoke with the patient regarding co pay cost. Patient is agreeable to cost and would like to have the initial Rx filled via [facility] and the Rx refills 90 day supply escribed to the Drug Store- [facility]. No further needs identified at this time   Transition of Care Asessment: Insurance and Status: Insurance coverage has been reviewed Patient has primary care physician: Yes Home environment has been reviewed: reviewed Prior level of function:: independent Prior/Current Home Services: No current home services Social Drivers of Health Review: SDOH reviewed no interventions necessary Readmission risk has been reviewed: Yes Transition of care needs: no transition of care needs at this time

## 2024-01-09 NOTE — Progress Notes (Signed)
 Rounding Note   Patient Name: [name] Date of Encounter: 01/09/2024  [facility] Cardiologist: Jayson Sierras, MD   Subjective  Poor appetite at baseline, mild chronic sounding nausea, no CP, SOB  Scheduled Meds:  apixaban   5 mg Oral BID   ascorbic acid   500 mg Oral Daily   cholecalciferol   400 Units Oral Daily   diltiazem   300 mg Oral Daily   dofetilide  250 mcg Oral BID   DULoxetine   60 mg Oral Daily   latanoprost   1 drop Both Eyes QHS   levothyroxine   25 mcg Oral QAC breakfast   losartan   75 mg Oral Daily   magnesium  oxide  400 mg Oral QHS   metFORMIN   500 mg Oral BID WC   metoprolol  succinate  100 mg Oral Daily   rosuvastatin   20 mg Oral Daily   sodium chloride  flush  3 mL Intravenous Q12H   timolol   1 drop Both Eyes Daily   Continuous Infusions:  sodium chloride      PRN Meds: sodium chloride , acetaminophen , albuterol , calcium  carbonate, diltiazem , HYDROcodone -acetaminophen , nitroGLYCERIN , sodium chloride  flush, Suvorexant   Vital Signs  Vitals:   01/08/24 1640 01/08/24 1900 01/09/24 0026 01/09/24 0449  BP: (!) 148/85 111/72 139/85 114/72  Pulse: 72 93 97 98  Resp: 16 16 15 16   Temp: 98.1 F (36.7 C) 98.4 F (36.9 C) 98.6 F (37 C) 98.1 F (36.7 C)  TempSrc: Oral Oral Oral Oral  SpO2: 99% 100% 100% 100%  Weight:      Height:       No intake or output data in the 24 hours ending 01/09/24 0804    01/08/2024    2:15 PM 01/08/2024    9:02 AM 12/28/2023    3:59 PM  Last 3 Weights  Weight (lbs) 141 lb 12.8 oz 140 lb 12.8 oz 142 lb 6.4 oz  Weight (kg) 64.32 kg 63.866 kg 64.592 kg      Telemetry  AFlutter 60's > 100's - Personally Reviewed  ECG   AFlutter 2:1, 100bpm, QTc stable - Personally Reviewed with Dr. Nancey  Physical Exam  GEN: No acute distress.   Neck: No JVD Cardiac: RRR, no murmurs, rubs, or gallops.  Respiratory: CTA b/l GI: Soft, nontender, non-distended  MS: No edema; No deformity. Neuro:  Nonfocal  Psych:  Normal affect   Labs High Sensitivity Troponin:  No results for input(s): TROPONINIHS in the last 720 hours.   Chemistry Recent Labs  Lab 01/08/24 0915 01/09/24 0452  NA 139 137  K 4.4 5.0  CL 102 99  CO2 30* 28  GLUCOSE 59* 119*  BUN 15 11  CREATININE 0.98 0.99  CALCIUM  10.2 9.4  MG 2.2 2.1  GFRNONAA  --  57*  ANIONGAP  --  10    Lipids No results for input(s): CHOL, TRIG, HDL, LABVLDL, LDLCALC, CHOLHDL in the last 168 hours.  HematologyNo results for input(s): WBC, RBC, HGB, HCT, MCV, MCH, MCHC, RDW, PLT in the last 168 hours. Thyroid  No results for input(s): TSH, FREET4 in the last 168 hours.  BNPNo results for input(s): BNP, PROBNP in the last 168 hours.  DDimer No results for input(s): DDIMER in the last 168 hours.   Radiology  No results found.  Cardiac Studies  12/31/23: TTE 1. Left ventricular ejection fraction, by estimation, is 55 to 60%. The  left ventricle has normal function. The left ventricle has no regional  wall motion abnormalities. Left ventricular diastolic parameters are  indeterminate.   2. Right ventricular systolic function is low normal. The right  ventricular size is normal. There is moderately elevated pulmonary artery  systolic pressure.   3. The mitral valve is abnormal. Mild mitral valve regurgitation. Mild  mitral stenosis. The mean mitral valve gradient is 5.0 mmHg.Heart rate 102  bpm. Moderate mitral annular calcification.   4. The tricuspid valve is abnormal. Tricuspid valve regurgitation is  moderate.   5. The aortic valve is tricuspid. There is mild calcification of the  aortic valve. There is mild thickening of the aortic valve. Aortic valve  regurgitation is moderate. No aortic stenosis is present.   6. The inferior vena cava is normal in size with greater than 50%  respiratory variability, suggesting right atrial pressure of 3 mmHg.     Patient Profile   83 y.o. female w/PMHx of   CAD (PCI 2010, 2017, 2022), HTN, HLD, DM AFib/flutter (atypical)  Admitted for Tikosyn  load  Assessment & Plan   Persistent Afib Atypical AFlutter CHA2DS2Vasc is 5, on Eliquis  Tikosyn  load is in progress K+ 5.0 Mag 2.1 Creat 0.99, stable QTc stable  DCCV tomorrow if not in SR, pt is aware and agreeable Informed Consent   Shared Decision Making/Informed Consent The risks (stroke, cardiac arrhythmias rarely resulting in the need for a temporary or permanent pacemaker, skin irritation or burns and complications associated with conscious sedation including aspiration, arrhythmia, respiratory failure and death), benefits (restoration of normal sinus rhythm) and alternatives of a direct current cardioversion were explained in detail to Ms. Tash and she agrees to proceed.       CAD No anginal symptoms Home meds  HTN Home meds  DM Home meds  For questions or updates, please contact [facility] Please consult [email or web address] for contact info under    Signed, Charlies Macario Arthur, PA-C  01/09/2024, 8:04 AM

## 2024-01-09 NOTE — Progress Notes (Signed)
 After 2 hrs of giving first dose of Tikison, EkG show Qtc of 456.

## 2024-01-09 NOTE — Progress Notes (Signed)
 Pharmacy: Dofetilide (Tikosyn) - Follow Up Assessment and Electrolyte Replacement  Pharmacy consulted to assist in monitoring and replacing electrolytes in this 83 y.o. female admitted on 01/08/2024 undergoing dofetilide initiation. First dofetilide dose: 01/08/24 at 2021  Labs:    Component Value Date/Time   K 4.4 01/08/2024 0915   MG 2.2 01/08/2024 0915     Plan: Potassium: K >/= 4: No additional supplementation needed  Magnesium : Mg > 2: No additional supplementation needed   Thank you for allowing pharmacy to be a part of this patient's care.   Nidia Schaffer, PharmD PGY2 Cardiology Pharmacy Resident  Please check AMION for all [facility] phone numbers After 10:00 PM, call Main Pharmacy [phone] 01/09/2024  6:54 AM

## 2024-01-10 ENCOUNTER — Encounter (HOSPITAL_COMMUNITY): Admission: RE | Disposition: A | Payer: Self-pay | Source: Ambulatory Visit | Attending: Cardiovascular Disease

## 2024-01-10 ENCOUNTER — Encounter

## 2024-01-10 ENCOUNTER — Inpatient Hospital Stay (HOSPITAL_COMMUNITY): Admitting: Anesthesiology

## 2024-01-10 ENCOUNTER — Encounter (HOSPITAL_COMMUNITY): Payer: Self-pay | Admitting: Cardiovascular Disease

## 2024-01-10 DIAGNOSIS — I4892 Unspecified atrial flutter: Secondary | ICD-10-CM

## 2024-01-10 DIAGNOSIS — I10 Essential (primary) hypertension: Secondary | ICD-10-CM

## 2024-01-10 DIAGNOSIS — F418 Other specified anxiety disorders: Secondary | ICD-10-CM | POA: Diagnosis not present

## 2024-01-10 DIAGNOSIS — I25110 Atherosclerotic heart disease of native coronary artery with unstable angina pectoris: Secondary | ICD-10-CM | POA: Diagnosis not present

## 2024-01-10 HISTORY — PX: CARDIOVERSION: EP1203

## 2024-01-10 LAB — BASIC METABOLIC PANEL WITH GFR
Anion gap: 9 (ref 5–15)
BUN: 11 mg/dL (ref 8–23)
CO2: 27 mmol/L (ref 22–32)
Calcium: 9.3 mg/dL (ref 8.9–10.3)
Chloride: 100 mmol/L (ref 98–111)
Creatinine, Ser: 0.88 mg/dL (ref 0.44–1.00)
GFR, Estimated: >60 mL/min (ref >60)
Glucose, Bld: 117 mg/dL — ABNORMAL HIGH (ref 70–99)
Potassium: 4.5 mmol/L (ref 3.5–5.1)
Sodium: 136 mmol/L (ref 135–145)

## 2024-01-10 LAB — GLUCOSE, CAPILLARY: Glucose-Capillary: 117 mg/dL — ABNORMAL HIGH (ref 70–99)

## 2024-01-10 LAB — MAGNESIUM: Magnesium: 2.1 mg/dL (ref 1.7–2.4)

## 2024-01-10 SURGERY — CARDIOVERSION (CATH LAB)
Anesthesia: General

## 2024-01-10 MED ORDER — LIDOCAINE 2% (20 MG/ML) 5 ML SYRINGE
INTRAMUSCULAR | Status: DC | PRN
  Administered 2024-01-10: 40 mg via INTRAVENOUS

## 2024-01-10 MED ORDER — PROPOFOL 10 MG/ML IV BOLUS
INTRAVENOUS | Status: DC | PRN
  Administered 2024-01-10: 40 mg via INTRAVENOUS

## 2024-01-10 SURGICAL SUPPLY — 1 items: PAD DEFIB RADIO PHYSIO CONN (PAD) ×1 IMPLANT

## 2024-01-10 NOTE — Progress Notes (Addendum)
 Rounding Note   Patient Name: [name] Date of Encounter: 01/10/2024  [facility] Cardiologist: Jayson Sierras, MD   Subjective  Slept well, no CP, SOB  Scheduled Meds:  apixaban   5 mg Oral BID   ascorbic acid   500 mg Oral Daily   cholecalciferol   400 Units Oral Daily   diltiazem   300 mg Oral Daily   dofetilide  250 mcg Oral BID   DULoxetine   60 mg Oral Daily   latanoprost   1 drop Both Eyes QHS   levothyroxine   25 mcg Oral QAC breakfast   losartan   75 mg Oral Daily   magnesium  oxide  400 mg Oral QHS   metFORMIN   500 mg Oral BID WC   metoprolol  succinate  100 mg Oral Daily   rosuvastatin   20 mg Oral Daily   sodium chloride  flush  3 mL Intravenous Q12H   timolol   1 drop Both Eyes Daily   Continuous Infusions:  sodium chloride      PRN Meds: acetaminophen , albuterol , calcium  carbonate, diltiazem , HYDROcodone -acetaminophen , nitroGLYCERIN , sodium chloride  flush   Vital Signs  Vitals:   01/09/24 1742 01/09/24 2001 01/09/24 2243 01/10/24 0438  BP: (!) 152/78 130/70 124/75 110/60  Pulse: 98 79 100 95  Resp: 18 18 18 18   Temp: 97.6 F (36.4 C) 97.8 F (36.6 C) 98.7 F (37.1 C) 97.9 F (36.6 C)  TempSrc: Oral Oral Oral Oral  SpO2: 100% 100% 100% 94%  Weight:      Height:        Intake/Output Summary (Last 24 hours) at 01/10/2024 0749 Last data filed at 01/10/2024 0439 Gross per 24 hour  Intake 600 ml  Output --  Net 600 ml      01/08/2024    2:15 PM 01/08/2024    9:02 AM 12/28/2023    3:59 PM  Last 3 Weights  Weight (lbs) 141 lb 12.8 oz 140 lb 12.8 oz 142 lb 6.4 oz  Weight (kg) 64.32 kg 63.866 kg 64.592 kg      Telemetry  AFlutter variale conduction, 60's > 90's, mostly 90's - Personally Reviewed  ECG   AFlutter 2:1, 100bpm, QTc stable - Personally Reviewed with Dr. Nancey  Physical Exam  GEN: No acute distress.   Neck: No JVD Cardiac: irreg-irreg, no murmurs, rubs, or gallops.  Respiratory: CTA b/l GI: Soft, nontender,  non-distended  MS: No edema; No deformity. Neuro:  Nonfocal  Psych: Normal affect   Labs High Sensitivity Troponin:  No results for input(s): TROPONINIHS in the last 720 hours.   Chemistry Recent Labs  Lab 01/08/24 0915 01/09/24 0452 01/10/24 0419  NA 139 137 136  K 4.4 5.0 4.5  CL 102 99 100  CO2 30* 28 27  GLUCOSE 59* 119* 117*  BUN 15 11 11   CREATININE 0.98 0.99 0.88  CALCIUM  10.2 9.4 9.3  MG 2.2 2.1 2.1  GFRNONAA  --  57* >60  ANIONGAP  --  10 9    Lipids No results for input(s): CHOL, TRIG, HDL, LABVLDL, LDLCALC, CHOLHDL in the last 168 hours.  HematologyNo results for input(s): WBC, RBC, HGB, HCT, MCV, MCH, MCHC, RDW, PLT in the last 168 hours. Thyroid  No results for input(s): TSH, FREET4 in the last 168 hours.  BNPNo results for input(s): BNP, PROBNP in the last 168 hours.  DDimer No results for input(s): DDIMER in the last 168 hours.   Radiology  No results found.  Cardiac Studies  12/31/23: TTE 1. Left ventricular ejection fraction,  by estimation, is 55 to 60%. The  left ventricle has normal function. The left ventricle has no regional  wall motion abnormalities. Left ventricular diastolic parameters are  indeterminate.   2. Right ventricular systolic function is low normal. The right  ventricular size is normal. There is moderately elevated pulmonary artery  systolic pressure.   3. The mitral valve is abnormal. Mild mitral valve regurgitation. Mild  mitral stenosis. The mean mitral valve gradient is 5.0 mmHg.Heart rate 102  bpm. Moderate mitral annular calcification.   4. The tricuspid valve is abnormal. Tricuspid valve regurgitation is  moderate.   5. The aortic valve is tricuspid. There is mild calcification of the  aortic valve. There is mild thickening of the aortic valve. Aortic valve  regurgitation is moderate. No aortic stenosis is present.   6. The inferior vena cava is normal in size with greater than 50%   respiratory variability, suggesting right atrial pressure of 3 mmHg.     Patient Profile   83 y.o. female w/PMHx of  CAD (PCI 2010, 2017, 2022), HTN, HLD, DM AFib/flutter (atypical)  Admitted for Tikosyn load  Assessment & Plan   Persistent Afib Atypical AFlutter CHA2DS2Vasc is 5, on Eliquis  Tikosyn load is in progress K+ 4.5 Mag 2.1 Creat 0.88, stable QTc stable  DCCV today Informed Consent   Shared Decision Making/Informed Consent The risks (stroke, cardiac arrhythmias rarely resulting in the need for a temporary or permanent pacemaker, skin irritation or burns and complications associated with conscious sedation including aspiration, arrhythmia, respiratory failure and death), benefits (restoration of normal sinus rhythm) and alternatives of a direct current cardioversion were explained in detail to [name] and she agrees to proceed.       CAD No anginal symptoms Home meds  HTN Home meds  DM Home meds  For questions or updates, please contact [facility] Please consult [email or web address] for contact info under    Signed, Charlies Macario Arthur, PA-C  01/10/2024, 7:49 AM

## 2024-01-10 NOTE — Progress Notes (Signed)
 Message received post DCCV from Charlies Arthur PA her post DCCV EKG (done 10:12) is good for post dose timing.

## 2024-01-10 NOTE — Progress Notes (Signed)
 Post dose/post DCCV EKG is reviewed  SB QTc stable Continue tikosyn  load Anticipate discharge tomorrow  Charlies Arthur, PA-C

## 2024-01-10 NOTE — Transfer of Care (Signed)
 Immediate Anesthesia Transfer of Care Note  Patient: [name]  Procedure(s) Performed: CARDIOVERSION  Patient Location: PACU  Anesthesia Type:General  Level of Consciousness: awake, alert , and oriented  Airway & Oxygen Therapy: Patient Spontanous Breathing and Patient connected to nasal cannula oxygen  Post-op Assessment: Report given to RN and Post -op Vital signs reviewed and stable  Post vital signs: Reviewed and stable  Last Vitals:  Vitals Value Taken Time  BP 133/65 0951  Temp 97 0951  Pulse 59 0951  Resp 16 0951  SpO2 100 0951    Last Pain:  Vitals:   01/10/24 0834  TempSrc: Temporal  PainSc:       Patients Stated Pain Goal: 0 (01/09/24 0825)  Complications: No notable events documented.

## 2024-01-10 NOTE — Progress Notes (Signed)
 Pharmacy: Dofetilide (Tikosyn) - Follow Up Assessment and Electrolyte Replacement  Pharmacy consulted to assist in monitoring and replacing electrolytes in this 83 y.o. female admitted on 01/08/2024 undergoing dofetilide initiation. First dofetilide dose: 01/08/24 2021  Labs:    Component Value Date/Time   K 4.5 01/10/2024 0419   MG 2.1 01/10/2024 0419     Plan: Potassium: K >/= 4: No additional supplementation needed  Magnesium : Mg > 2: No additional supplementation needed   Thank you for allowing pharmacy to be a part of this patient's care.   Nidia Schaffer, PharmD PGY2 Cardiology Pharmacy Resident  Please check AMION for all [facility] Pharmacy phone numbers After 10:00 PM, call Main Pharmacy [phone] 01/10/2024  6:54 AM

## 2024-01-10 NOTE — Plan of Care (Signed)
   Problem: Education: Goal: Knowledge of General Education information will improve Description Including pain rating scale, medication(s)/side effects and non-pharmacologic comfort measures Outcome: Progressing

## 2024-01-10 NOTE — Anesthesia Postprocedure Evaluation (Signed)
 Anesthesia Post Note  Patient: [name]  Procedure(s) Performed: CARDIOVERSION     Patient location during evaluation: Cath Lab Anesthesia Type: General Level of consciousness: awake and alert Pain management: pain level controlled Vital Signs Assessment: post-procedure vital signs reviewed and stable Respiratory status: spontaneous breathing, nonlabored ventilation, respiratory function stable and patient connected to nasal cannula oxygen Cardiovascular status: blood pressure returned to baseline and stable Postop Assessment: no apparent nausea or vomiting Anesthetic complications: no   There were no known notable events for this encounter.  Last Vitals:  Vitals:   01/10/24 1048 01/10/24 1052  BP: (!) 130/55 (!) 130/55  Pulse:  63  Resp:    Temp:    SpO2:      Last Pain:  Vitals:   01/10/24 1019  TempSrc:   PainSc: 0-No pain                 [name]

## 2024-01-10 NOTE — Interval H&P Note (Signed)
 History and Physical Interval Note:  01/10/2024 9:05 AM  [name]  has presented today for surgery, with the diagnosis of afib.  The various methods of treatment have been discussed with the patient and family. After consideration of risks, benefits and other options for treatment, the patient has consented to  Procedure(s): CARDIOVERSION (N/A) as a surgical intervention.  The patient's history has been reviewed, patient examined, no change in status, stable for surgery.  I have reviewed the patient's chart and labs.  Questions were answered to the patient's satisfaction.     Aidel Davisson

## 2024-01-10 NOTE — Op Note (Signed)
 Procedure: Electrical Cardioversion Indications:  Atrial Flutter  Procedure Details:  Consent: Risks of procedure as well as the alternatives and risks of each were explained to the (patient/caregiver).  Consent for procedure obtained.  Time Out: Verified patient identification, verified procedure, site/side was marked, verified correct patient position, special equipment/implants available, medications/allergies/relevent history reviewed, required imaging and test results available.  Performed  Patient placed on cardiac monitor, pulse oximetry, supplemental oxygen as necessary.  Sedation given: propofol  IV, Dr. Julieann Pacer pads placed anterior and posterior chest.  Cardioverted 1 time(s).  Cardioversion with synchronized biphasic 200J shock.  Evaluation: Findings: Post procedure EKG shows: NSR Complications: None Patient did tolerate procedure well.  Time Spent Directly with the Patient:  30 minutes   [name] 01/10/2024, 9:49 AM

## 2024-01-11 ENCOUNTER — Other Ambulatory Visit (HOSPITAL_COMMUNITY): Payer: Self-pay

## 2024-01-11 DIAGNOSIS — I4819 Other persistent atrial fibrillation: Secondary | ICD-10-CM | POA: Diagnosis not present

## 2024-01-11 LAB — BASIC METABOLIC PANEL WITH GFR
Anion gap: 10 (ref 5–15)
BUN: 13 mg/dL (ref 8–23)
CO2: 27 mmol/L (ref 22–32)
Calcium: 9.4 mg/dL (ref 8.9–10.3)
Chloride: 99 mmol/L (ref 98–111)
Creatinine, Ser: 0.79 mg/dL (ref 0.44–1.00)
GFR, Estimated: >60 mL/min (ref >60)
Glucose, Bld: 119 mg/dL — ABNORMAL HIGH (ref 70–99)
Potassium: 4.6 mmol/L (ref 3.5–5.1)
Sodium: 136 mmol/L (ref 135–145)

## 2024-01-11 LAB — MAGNESIUM: Magnesium: 2.2 mg/dL (ref 1.7–2.4)

## 2024-01-11 MED ORDER — DOFETILIDE 250 MCG PO CAPS
250.0000 ug | ORAL_CAPSULE | Freq: Two times a day (BID) | ORAL | 3 refills | Status: DC
  Filled 2024-01-11: qty 60, 30d supply, fill #0

## 2024-01-11 NOTE — Care Management Important Message (Signed)
 Important Message  Patient Details  Name: [name] MRN: [id number] Date of Birth: 20-May-1940   Important Message Given:  Yes - Medicare IM     Vonzell Arrie Sharps 01/11/2024, 10:45 AM

## 2024-01-11 NOTE — Care Management Important Message (Signed)
 Important Message  Patient Details  Name: [name] MRN: [id number] Date of Birth: 1941/01/01   Important Message Given:  Yes - Medicare IM     Vonzell Arrie Sharps 01/11/2024, 11:35 AM

## 2024-01-11 NOTE — Discharge Summary (Signed)
 ELECTROPHYSIOLOGY PROCEDURE DISCHARGE SUMMARY    Patient ID: [name],  MRN: [id number], DOB/AGE: [birth year] 83 y.o.  Admit date: 01/08/2024 Discharge date: 01/11/2024  Primary Care Physician: [name], FNP  Primary Cardiologist: Dr. Debera Electrophysiologist: Dr. Nancey  Primary Discharge Diagnosis:  1.  persistent atrial fibrillation, AFlutter status post Tikosyn  loading this admission      CHA2DS2Vasc is 5, on Eliquis   Secondary Discharge Diagnosis:  CAD HTN DM  No Known Allergies   Procedures This Admission:  1.  Tikosyn  loading 2.  Direct current cardioversion on 01/10/24 by Dr Francyne which successfully restored SR.  There were no early apparent complications.   Brief HPI: [name] is a 83 y.o. female with a past medical history as noted above.  They were referred to EP in the outpatient setting for treatment options of atrial fibrillation.  Risks, benefits, and alternatives to Tikosyn  were reviewed with the patient who wished to proceed.    Hospital Course:  The patient was admitted and Tikosyn  was initiated.  Renal function and electrolytes were followed during the hospitalization.   The patient's QTc remained stable.  On 01/10/24 the patient underwent direct current cardioversion which restored sinus rhythm.  She was monitored until discharge on telemetry which demonstrated AFlutter w/variable conduction > SB/SR 50's-60's.  On the day of discharge, she feels well, better energy in SR, she was examined by Dr Nancey who considered the patient stable for discharge to home.  Follow-up has been arranged with the AFib clinic in 1 week and EP team in 4 weeks.   Tikosyn  teaching was completed No new or additional electrolyte replacement for home  Physical Exam: Vitals:   01/10/24 2013 01/10/24 2355 01/11/24 0819 01/11/24 0826  BP: 131/76 (!) 137/56 (!) 137/54 (!) 137/54  Pulse:  63 (!) 57 64  Resp:  18 18   Temp: 97.8 F (36.6 C) 98.4 F  (36.9 C) 97.7 F (36.5 C)   TempSrc: Oral Oral Oral   SpO2: 98% 96% 97%   Weight:      Height:         GEN- The patient is well appearing, alert and oriented x 3 today.   HEENT: normocephalic, atraumatic; sclera clear, conjunctiva pink; hearing intact; oropharynx clear; neck supple, no JVP Lymph- no cervical lymphadenopathy Lungs- CTA b/l, normal work of breathing.  No wheezes, rales, rhonchi Heart- RRR, 1-2/6 SM, rubs or gallops, PMI not laterally displaced GI- soft, non-tender, non-distended Extremities- no clubbing, cyanosis, or edema MS- no significant deformity or atrophy Skin- warm and dry, no rash or lesion Psych- euthymic mood, full affect Neuro- no gross focal deficits    Labs:   Lab Results  Component Value Date   WBC 3.7 (L) 12/11/2023   HGB 11.9 (L) 12/11/2023   HCT 36.4 12/11/2023   MCV 92.2 12/11/2023   PLT 196 12/11/2023    Recent Labs  Lab 01/11/24 0427  NA 136  K 4.6  CL 99  CO2 27  BUN 13  CREATININE 0.79  CALCIUM  9.4  GLUCOSE 119*     Discharge Medications:  Allergies as of 01/11/2024   No Known Allergies      Medication List     STOP taking these medications    Spikevax syringe Generic drug: COVID-19 mRNA vaccine       TAKE these medications    acetaminophen  500 MG tablet Commonly known as: TYLENOL  Take 1,000 mg by mouth every 6 (six) hours  as needed for moderate pain. What changed: when to take this   ascorbic acid  500 MG tablet Commonly known as: VITAMIN C  Take 500 mg by mouth daily.   Belsomra  5 MG Tabs Generic drug: Suvorexant  Take 1 tablet (5 mg total) by mouth at bedtime as needed. What changed:  when to take this reasons to take this   bimatoprost 0.01 % Soln Commonly known as: LUMIGAN Place 1 drop into both eyes at bedtime.   cetirizine 10 MG tablet Commonly known as: ZYRTEC Take 10 mg by mouth daily as needed for allergies. What changed: when to take this   diclofenac  Sodium 1 % Gel Commonly known  as: Voltaren  Apply 2 g topically 4 (four) times daily.   diltiazem  300 MG 24 hr capsule Commonly known as: CARDIZEM  CD Take 1 capsule (300 mg total) by mouth daily.   diltiazem  60 MG tablet Commonly known as: Cardizem  Take 1 every 8 hours as needed for fast heart rate and palpitations   dofetilide  250 MCG capsule Commonly known as: TIKOSYN  Take 1 capsule (250 mcg total) by mouth 2 (two) times daily.   DULoxetine  60 MG capsule Commonly known as: CYMBALTA  Take 1 capsule (60 mg total) by mouth daily.   Eliquis  5 MG Tabs tablet Generic drug: apixaban  TAKE ONE TABLET BY MOUTH TWICE DAILY   GARLIC  PO Take 1 tablet by mouth daily.   HYDROcodone -acetaminophen  7.5-325 MG tablet Commonly known as: NORCO Take 1 tablet by mouth every 6 (six) hours as needed for moderate pain (pain score 4-6). Notes to patient: You also have acetaminophen  on your list, use caution to avoid over-dose of acetaminophen     levothyroxine  25 MCG tablet Commonly known as: SYNTHROID  Take 1 tablet (25 mcg total) by mouth daily before breakfast.   lidocaine  4 % Commonly known as: HM Lidocaine  Patch Place 2 patches onto the skin daily. What changed:  when to take this reasons to take this   losartan  50 MG tablet Commonly known as: COZAAR  Take 1.5 tablets (75 mg total) by mouth daily.   MAGNESIUM  CARBONATE PO Take 1 tablet by mouth at bedtime.   metFORMIN  500 MG tablet Commonly known as: GLUCOPHAGE  Take 1 tablet (500 mg total) by mouth 2 (two) times daily with a meal.   metoprolol  succinate 100 MG 24 hr tablet Commonly known as: TOPROL -XL TAKE 1 TABLET BY MOUTH DAILY WITH FOOD   nitroGLYCERIN  0.4 MG SL tablet Commonly known as: NITROSTAT  DISSOLVE 1 TAB UNDER TOUNGE FOR CHEST PAIN. MAY REPEAT EVERY 5 MINUTES FOR 3 DOSES. IF NO RELIEF CALL 911 OR GO TO ER What changed: See the new instructions.   rosuvastatin  20 MG tablet Commonly known as: CRESTOR  TAKE ONE (1) TABLET BY MOUTH EVERY DAY    Salonpas  3.02-26-08 % Ptch Generic drug: Camphor-Menthol -Methyl Sal Apply 1 patch topically daily as needed (pain). What changed: when to take this   timolol  0.5 % ophthalmic solution Commonly known as: TIMOPTIC  Place 1 drop into both eyes daily.   TUMS PO Take 2 tablets by mouth 4 (four) times daily as needed (for acid reflux/indigestion). What changed: when to take this   TURMERIC PO Take 1 tablet by mouth daily.   Ventolin  HFA 108 (90 Base) MCG/ACT inhaler Generic drug: albuterol  Inhale 1-2 puffs into the lungs every 4 (four) hours as needed for shortness of breath. What changed: when to take this   VITAMIN B 12 PO Take 1 tablet by mouth daily.   Vitamin D  (Cholecalciferol ) 10 MCG (  400 UNIT) Caps Take 400 Units by mouth daily.        Disposition: home Discharge Instructions     Diet - low sodium heart healthy   Complete by: As directed    Increase activity slowly   Complete by: As directed         Duration of Discharge Encounter: 13 minutes, APP time.  Bonney Charlies Arthur, PA-C 01/11/2024 11:24 AM

## 2024-01-11 NOTE — Progress Notes (Signed)
 EKG last night with stable QTc , telemetry remains in SR (reviewed by myself and Dr. Nancey) VSS Dr. Nancey has seen the patient Continue Tikosyn  Anticipate discharge this afternoon  Charlies Arthur, PA-C

## 2024-01-11 NOTE — Progress Notes (Signed)
 Pharmacy: Dofetilide  (Tikosyn ) - Follow Up Assessment and Electrolyte Replacement  Pharmacy consulted to assist in monitoring and replacing electrolytes in this 83 y.o. female admitted on 01/08/2024 undergoing dofetilide  initiation. First dofetilide  dose: 01/08/24  Labs:    Component Value Date/Time   K 4.6 01/11/2024 0427   MG 2.2 01/11/2024 0427     Plan: Potassium: K >/= 4: No additional supplementation needed  Magnesium : Mg > 2: No additional supplementation needed   As patient has required on average 0 mEq of potassium replacement every day, recommend discharging patient with prescription for: n/a  [name], PharmD, BCPS, [facility] Clinical Pharmacist [phone] Please check AMION for all [facility] Pharmacy numbers 01/11/2024

## 2024-01-14 ENCOUNTER — Telehealth: Payer: Self-pay | Admitting: *Deleted

## 2024-01-14 ENCOUNTER — Telehealth: Payer: Self-pay

## 2024-01-14 ENCOUNTER — Encounter: Payer: Self-pay | Admitting: *Deleted

## 2024-01-14 NOTE — Patient Instructions (Signed)
 [name] - I am sorry I was unable to reach you today for our scheduled appointment. I work with [name], FNP and am calling to support your healthcare needs. Please contact me at [phone] at your earliest convenience. I look forward to speaking with you soon.   Thank you,  Rosina Forte, BSN RN [facility], [facility] RN Care Manager Direct Dial: [phone]  Fax: [phone]

## 2024-01-14 NOTE — Telephone Encounter (Signed)
 MDT electrical reset 01/10/24. Acknowledged in [facility].

## 2024-01-14 NOTE — Transitions of Care (Post Inpatient/ED Visit) (Signed)
 01/14/2024  Name: [name] MRN: [id number] DOB: [birth year]  Today's TOC FU Call Status: Today's TOC FU Call Status:: Successful TOC FU Call Completed TOC FU Call Complete Date: 01/14/24  Patient's Name and Date of Birth confirmed. DOB, Name  Transition Care Management Follow-up Telephone Call Date of Discharge: 01/11/24 Discharge Facility: [facility]) Type of Discharge: Inpatient Admission Primary Inpatient Discharge Diagnosis:: Atrial Flutter How have you been since you were released from the hospital?: Better (eating, drinking well, no issues with bowel / bladder) Any questions or concerns?: No  Items Reviewed: Did you receive and understand the discharge instructions provided?: Yes Medications obtained,verified, and reconciled?: Yes (Medications Reviewed) Any new allergies since your discharge?: No Dietary orders reviewed?: Yes Type of Diet Ordered:: heart healthy Do you have support at home?: Yes People in Home [RPT]: alone Name of Support/Comfort Primary Source: adult daughter assists as needed  Medications Reviewed Today: Medications Reviewed Today     Reviewed by Aura Mliss LABOR, RN (Registered Nurse) on 01/14/24 at 1414  Med List Status: <None>   Medication Order Taking? Sig Documenting Provider Last Dose Status Informant  acetaminophen  (TYLENOL ) 500 MG tablet [id number] Yes Take 1,000 mg by mouth every 6 (six) hours as needed for moderate pain. [provider]  Active Self, Pharmacy Records  Ascorbic Acid  (VITAMIN C ) 500 MG tablet [id number] Yes Take 500 mg by mouth daily.   [provider]  Active Self, Pharmacy Records  bimatoprost (LUMIGAN) 0.01 % SOLN [id number] Yes Place 1 drop into both eyes at bedtime. [provider]  Active Self, Pharmacy Records  Calcium  Carbonate Antacid (TUMS PO) [id number] Yes Take 2 tablets by mouth 4 (four) times daily as needed (for acid reflux/indigestion).  [provider]  Active Self, Pharmacy  Records  Camphor-Menthol -Methyl Sal (SALONPAS ) 3.02-26-08 % PTCH [id number] Yes Apply 1 patch topically daily as needed (pain). [provider]  Active Self, Pharmacy Records  cetirizine (ZYRTEC) 10 MG tablet [id number] Yes Take 10 mg by mouth daily as needed for allergies. [provider]  Active Self, Pharmacy Records  Cyanocobalamin  (VITAMIN B 12 PO) [id number] Yes Take 1 tablet by mouth daily. [provider]  Active Self, Pharmacy Records  diclofenac  Sodium (VOLTAREN ) 1 % GEL [id number]  Apply 2 g topically 4 (four) times daily.  Patient not taking: Reported on 01/14/2024   Cherylene Homer CHRISTELLA, NP  Active Self, Pharmacy Records  diltiazem  (CARDIZEM  CD) 300 MG 24 hr capsule [id number] Yes Take 1 capsule (300 mg total) by mouth daily. [name], MD  Active Self, Pharmacy Records  diltiazem  (CARDIZEM ) 60 MG tablet [id number] Yes Take 1 every 8 hours as needed for fast heart rate and palpitations Suzette Pac, MD  Active Self, Pharmacy Records  dofetilide  (TIKOSYN ) 250 MCG capsule [id number] Yes Take 1 capsule (250 mcg total) by mouth 2 (two) times daily. Leverne Charlies Helling, PA-C  Active   DULoxetine  (CYMBALTA ) 60 MG capsule [id number] Yes Take 1 capsule (60 mg total) by mouth daily. [name], FNP  Active Self, Pharmacy Records  ELIQUIS  5 MG TABS tablet [id number] Yes TAKE ONE TABLET BY MOUTH TWICE DAILY [name], MD  Active Self, Pharmacy Records  GARLIC  PO [id number] Yes Take 1 tablet by mouth daily. [provider]  Active Self, Pharmacy Records  HYDROcodone -acetaminophen  (NORCO) 7.5-325 MG tablet [id number] Yes Take 1 tablet by mouth every 6 (six) hours as needed for moderate pain (pain score 4-6). [name], FNP  Active  Self, Pharmacy Records  levothyroxine  (SYNTHROID ) 25 MCG tablet [id number] Yes Take 1 tablet (25 mcg total) by mouth daily before breakfast. [name], NP  Active Self, Pharmacy Records  lidocaine  (HM LIDOCAINE  PATCH) 4 %  [id number] Yes Place 2 patches onto the skin daily. Cathlene Marry Lenis, FNP  Active Self, Pharmacy Records  losartan  (COZAAR ) 50 MG tablet [id number] Yes Take 1.5 tablets (75 mg total) by mouth daily. [name], MD  Active Self, Pharmacy Records  MAGNESIUM  CARBONATE PO [id number] Yes Take 1 tablet by mouth at bedtime. [provider]  Active Self, Pharmacy Records  metFORMIN  (GLUCOPHAGE ) 500 MG tablet [id number] Yes Take 1 tablet (500 mg total) by mouth 2 (two) times daily with a meal. Rakes, Rock HERO, FNP  Active Self, Pharmacy Records  metoprolol  succinate (TOPROL -XL) 100 MG 24 hr tablet [id number] Yes TAKE 1 TABLET BY MOUTH DAILY WITH FOOD [name], MD  Active Self, Pharmacy Records  nitroGLYCERIN  (NITROSTAT ) 0.4 MG SL tablet [id number] Yes DISSOLVE 1 TAB UNDER TOUNGE FOR CHEST PAIN. MAY REPEAT EVERY 5 MINUTES FOR 3 DOSES. IF NO RELIEF CALL 911 OR GO TO ER [name], MD  Active Self, Pharmacy Records  rosuvastatin  (CRESTOR ) 20 MG tablet [id number] Yes TAKE ONE (1) TABLET BY MOUTH EVERY DAY [name], MD  Active Self, Pharmacy Records  Suvorexant  (BELSOMRA ) 5 MG TABS [id number] Yes Take 1 tablet (5 mg total) by mouth at bedtime as needed. Severa Rock HERO, FNP  Active Self, Pharmacy Records  timolol  (TIMOPTIC ) 0.5 % ophthalmic solution [id number] Yes Place 1 drop into both eyes daily. [provider]  Active Self, Pharmacy Records  TURMERIC PO [id number] Yes Take 1 tablet by mouth daily. [provider]  Active Self, Pharmacy Records  VENTOLIN  HFA 108 [id number] Base) MCG/ACT inhaler [id number] Yes Inhale 1-2 puffs into the lungs every 4 (four) hours as needed for shortness of breath. Pearlean Manus, MD  Active Self, Pharmacy Records  Vitamin D , Cholecalciferol , 10 MCG (400 UNIT) CAPS [id number] Yes Take 400 Units by mouth daily. [provider]  Active Self, Pharmacy Records            Home Care and Equipment/Supplies: Were Home Health  Services Ordered?: No Any new equipment or medical supplies ordered?: No  Functional Questionnaire: Do you need assistance with bathing/showering or dressing?: No Do you need assistance with meal preparation?: No Do you need assistance with eating?: No Do you have difficulty maintaining continence: No Do you have difficulty managing or taking your medications?: No  Follow up appointments reviewed: PCP Follow-up appointment confirmed?: Yes Date of PCP follow-up appointment?: 01/16/24 Follow-up Provider: Rock Severa Driscilla Lionel Follow-up appointment confirmed?: Yes Date of Specialist follow-up appointment?: 01/21/24 Follow-Up Specialty Provider:: Clint Fenton   @ 330 pm Do you need transportation to your follow-up appointment?: No Do you understand care options if your condition(s) worsen?: Yes-patient verbalized understanding  SDOH Interventions Today    Flowsheet Row Most Recent Value  SDOH Interventions   Food Insecurity Interventions Intervention Not Indicated  Housing Interventions Intervention Not Indicated  Transportation Interventions Intervention Not Indicated  Utilities Interventions Intervention Not Indicated    Goals Addressed             This Visit's Progress    VBC[facility] Transitions of Care (TOC) Care Plan       Problems:  Recent Hospitalization for treatment of Atrial Fibrillation  Goal:  Over the next 30 days, the patient will not experience hospital  readmission  Interventions:  AFIB Interventions:  Counseled on increased risk of stroke due to Afib and benefits of anticoagulation for stroke prevention Reviewed importance of adherence to anticoagulant exactly as prescribed Counseled on bleeding risk associated with taking anticoagulant and importance of self-monitoring for signs/symptoms of bleeding Counseled on importance of regular laboratory monitoring as prescribed Afib action plan reviewed Screening for signs and symptoms of depression related to  chronic disease state  Assessed social determinant of health barriers Reviewed Atrial Fibrillation action plan Reviewed all upcoming scheduled appointments In basket message sent for collaboration with longitudinal RN CM Rosina Forte with update pt is enrolled in [facility] 30 day program  Patient Self Care Activities:  Attend all scheduled provider appointments Attend church or other social activities Call pharmacy for medication refills 3-7 days in advance of running out of medications Call provider office for new concerns or questions  Notify RN Care Manager of TOC call rescheduling needs Participate in Transition of Care Program/Attend TOC scheduled calls Perform all self care activities independently  Take medications as prescribed   check pulse (heart) rate once a day make a plan to exercise regularly make a plan to eat healthy keep all lab appointments take medicine as prescribed wear medical alert identification  Plan:  Telephone follow up appointment with care management team member scheduled for:  01/24/24 @ 115 pm The patient has been provided with contact information for the care management team and has been advised to call with any health related questions or concerns.         Mliss Creed [facility], BSN RN Care Manager/ Transition of Care [place]/ [facility] [phone]

## 2024-01-16 ENCOUNTER — Ambulatory Visit: Payer: Self-pay | Admitting: Family Medicine

## 2024-01-16 ENCOUNTER — Encounter: Payer: Self-pay | Admitting: Family Medicine

## 2024-01-16 VITALS — BP 150/78 | HR 62 | Temp 97.8°F | Ht 64.02 in | Wt 138.6 lb

## 2024-01-16 DIAGNOSIS — Z7984 Long term (current) use of oral hypoglycemic drugs: Secondary | ICD-10-CM

## 2024-01-16 DIAGNOSIS — I4819 Other persistent atrial fibrillation: Secondary | ICD-10-CM

## 2024-01-16 DIAGNOSIS — I152 Hypertension secondary to endocrine disorders: Secondary | ICD-10-CM

## 2024-01-16 DIAGNOSIS — E039 Hypothyroidism, unspecified: Secondary | ICD-10-CM

## 2024-01-16 DIAGNOSIS — E1169 Type 2 diabetes mellitus with other specified complication: Secondary | ICD-10-CM | POA: Diagnosis not present

## 2024-01-16 DIAGNOSIS — E1159 Type 2 diabetes mellitus with other circulatory complications: Secondary | ICD-10-CM

## 2024-01-16 DIAGNOSIS — E785 Hyperlipidemia, unspecified: Secondary | ICD-10-CM

## 2024-01-16 LAB — BAYER DCA HB A1C WAIVED: HB A1C (BAYER DCA - WAIVED): 5.9 % — ABNORMAL HIGH (ref 4.8–5.6)

## 2024-01-16 MED ORDER — METFORMIN HCL 500 MG PO TABS: 500.0000 mg | ORAL_TABLET | Freq: Every day | ORAL | 0 refills | Status: DC

## 2024-01-16 NOTE — Progress Notes (Signed)
 Subjective:  Patient ID: [name], female    DOB: [birth year], 83 y.o.   MRN: [id number]  Patient Care Team: [name], FNP as PCP - General (Family Medicine) [name], MD as PCP - Cardiology (Cardiology) Mealor, Eulas BRAVO, MD as PCP - Electrophysiology (Cardiology) [name], MD as Consulting Physician (Otolaryngology) [name], [facility] (Pharmacist) [name], NP as Nurse Practitioner (Endocrinology) [facility] [name], RN as [facility] Care Management (General Practice) [name], RN Aura Mliss LABOR, RN as [facility] Care Management   Chief Complaint:  Medical Management of Chronic Issues (3 month chronic follow up )   HPI: [name] is a 83 y.o. female presenting on 01/16/2024 for Medical Management of Chronic Issues (3 month chronic follow up )   [name] is an 83 year old female with atrial fibrillation who presents with weight loss and nutritional concerns.  She recently underwent a cardioversion procedure and is doing well post-procedure. Since returning home, she has not experienced any irregular heartbeats or palpitations. She is currently taking Tikosyn  and Cardizem .  She is experiencing difficulty maintaining her weight, having lost two pounds since her last visit earlier this month. She describes a lack of appetite and states that 'nothing tastes right.' Her diet primarily consists of Ensure, which she uses as a breakfast substitute, and she struggles to eat three meals a day. She often lets food go to waste because she does not feel hungry or desire what she has available.  She has been taking metformin  twice a day for diabetes management, with no reported side effects.  She has been experiencing sleep disturbances and reports that her current sleep medication, Belsomra , is not effective. She has been taking the medication as prescribed but has not noticed any improvement in her  sleep quality. She has some weakness, but it is improving, and she is able to walk in her yard without needing assistance to climb steps.  She is scheduled to see an endocrinologist next month for thyroid  evaluation, as there is a concern that her thyroid  function may be contributing to her weight loss.  No significant muscle aches or pains. No side effects from Tikosyn  and no daytime tiredness from her sleep medication.          Relevant past medical, surgical, family, and social history reviewed and updated as indicated.  Allergies and medications reviewed and updated. Data reviewed: Chart in Epic.   Past Medical History:  Diagnosis Date   Anxiety    Arthritis    Bulging lumbar disc    Chronic lower back pain    Coronary atherosclerosis of native coronary artery    DES LAD 02/2008, DES LAD 02/2015, DES ramus and DES x2 mid RCA 02/2020   Depression    Dyslipidemia    Essential hypertension    Facial numbness    Frequent headaches    GERD (gastroesophageal reflux disease)    Glaucoma    Heart attack (HCC) 02/2020   Insomnia    Paroxysmal atrial fibrillation (HCC)    Diagnosed October 2019   PSVT (paroxysmal supraventricular tachycardia)    Refusal of blood transfusions as patient is Jehovah's Witness    Thyroid  disease    Type 2 diabetes mellitus (HCC)    Borderline   Vitamin D  deficiency     Past Surgical History:  Procedure Laterality Date   ATRIAL FIBRILLATION ABLATION N/A 02/16/2021   Procedure:  ATRIAL FIBRILLATION ABLATION;  Surgeon: Kelsie Agent, MD;  Location: [facility] INVASIVE CV LAB;  Service: Cardiovascular;  Laterality: N/A;   BIOPSY  09/21/2021   Procedure: BIOPSY;  Surgeon: Shaaron Lamar HERO, MD;  Location: [facility] ENDO SUITE;  Service: Endoscopy;;  gastric   CARDIAC CATHETERIZATION N/A 02/23/2015   Procedure: Left Heart Cath and Coronary Angiography;  Surgeon: Ozell Fell, MD;  Location: [facility] INVASIVE CV LAB;  Service: Cardiovascular;  Laterality: N/A;   CARDIAC  CATHETERIZATION N/A 02/23/2015   Procedure: Coronary Stent Intervention;  Surgeon: Ozell Fell, MD;  Location: [facility] INVASIVE CV LAB;  Service: Cardiovascular;  Laterality: N/A;   CARDIAC CATHETERIZATION N/A 01/04/2016   Procedure: Left Heart Cath and Coronary Angiography;  Surgeon: Peter M Jordan, MD;  Location: [facility] INVASIVE CV LAB;  Service: Cardiovascular;  Laterality: N/A;   CARDIOVERSION N/A 01/10/2024   Procedure: CARDIOVERSION;  Surgeon: Francyne Headland, MD;  Location: [facility] INVASIVE CV LAB;  Service: Cardiovascular;  Laterality: N/A;   CATARACT EXTRACTION     CORONARY ANGIOPLASTY  02/23/2015   CORONARY ANGIOPLASTY WITH STENT PLACEMENT  2009   CORONARY STENT INTERVENTION N/A 03/09/2020   Procedure: CORONARY STENT INTERVENTION;  Surgeon: Dann Candyce RAMAN, MD;  Location: [facility] INVASIVE CV LAB;  Service: Cardiovascular;  Laterality: N/A;   DILATION AND CURETTAGE OF UTERUS     ESOPHAGOGASTRODUODENOSCOPY (EGD) WITH PROPOFOL  N/A 09/21/2021   normal esophagus, abnormal gastric mucosa s/p biopsy, normal duodenum. Pathology with H.pylori. Prescribed Prevpac.   implantable loop recorder placement  04/01/2020   Medtronic Reveal [place] model [id number] [id number]) implantable loop recorder    LEFT HEART CATH AND CORONARY ANGIOGRAPHY N/A 03/09/2020   Procedure: LEFT HEART CATH AND CORONARY ANGIOGRAPHY;  Surgeon: Dann Candyce RAMAN, MD;  Location: [facility] INVASIVE CV LAB;  Service: Cardiovascular;  Laterality: N/A;   TUBAL LIGATION      Social History   Socioeconomic History   Marital status: Widowed    Spouse name: Not on file   Number of children: 8   Years of education: 10th grade   Highest education level: 10th grade  Occupational History   Occupation: RETIRED  Tobacco Use   Smoking status: Never    Passive exposure: Never   Smokeless tobacco: Never   Tobacco comments:    Never smoke 11/15/21  Vaping Use   Vaping status: Never Used  Substance and Sexual Activity   Alcohol use: Not Currently     Alcohol/week: 1.0 standard drink of alcohol    Types: 1 Glasses of wine per week    Comment: RARE OCCASION   Drug use: No   Sexual activity: Not Currently  Other Topics Concern   Not on file  Social History Narrative   Lives at home alone.   Right-handed.   No caffeine use.   Daughter lives next door and helps her out   Social Drivers of Health   Financial Resource Strain: Low Risk  (03/21/2023)   Received [facility]   Overall Financial Resource Strain (CARDIA)    Difficulty of Paying Living Expenses: Not very hard  Food Insecurity: No Food Insecurity (01/14/2024)   Hunger Vital Sign    Worried About Running Out of Food in the Last Year: Never true    Ran Out of Food in the Last Year: Never true  Transportation Needs: No Transportation Needs (01/14/2024)   PRAPARE - Administrator, Civil Service (Medical): No    Lack of Transportation (Non-Medical): No  Physical Activity: Inactive (03/05/2023)  Exercise Vital Sign    Days of Exercise per Week: 0 days    Minutes of Exercise per Session: 0 min  Stress: No Stress Concern Present (03/05/2023)   [employer] of Occupational Health - Occupational Stress Questionnaire    Feeling of Stress : Not at all  Social Connections: Moderately Isolated (01/09/2024)   Social Connection and Isolation Panel    Frequency of Communication with Friends and Family: More than three times a week    Frequency of Social Gatherings with Friends and Family: Three times a week    Attends Religious Services: More than 4 times per year    Active Member of Clubs or Organizations: No    Attends Banker Meetings: Never    Marital Status: Widowed  Intimate Partner Violence: Not At Risk (01/14/2024)   Humiliation, Afraid, Rape, and Kick questionnaire    Fear of Current or Ex-Partner: No    Emotionally Abused: No    Physically Abused: No    Sexually Abused: No    Outpatient Encounter Medications as of 01/16/2024   Medication Sig   acetaminophen  (TYLENOL ) 500 MG tablet Take 1,000 mg by mouth every 6 (six) hours as needed for moderate pain.   Ascorbic Acid  (VITAMIN C ) 500 MG tablet Take 500 mg by mouth daily.     bimatoprost (LUMIGAN) 0.01 % SOLN Place 1 drop into both eyes at bedtime.   Calcium  Carbonate Antacid (TUMS PO) Take 2 tablets by mouth 4 (four) times daily as needed (for acid reflux/indigestion).    Camphor-Menthol -Methyl Sal (SALONPAS ) 3.02-26-08 % PTCH Apply 1 patch topically daily as needed (pain).   cetirizine (ZYRTEC) 10 MG tablet Take 10 mg by mouth daily as needed for allergies.   Cyanocobalamin  (VITAMIN B 12 PO) Take 1 tablet by mouth daily.   diclofenac  Sodium (VOLTAREN ) 1 % GEL Apply 2 g topically 4 (four) times daily.   diltiazem  (CARDIZEM  CD) 300 MG 24 hr capsule Take 1 capsule (300 mg total) by mouth daily.   diltiazem  (CARDIZEM ) 60 MG tablet Take 1 every 8 hours as needed for fast heart rate and palpitations   dofetilide  (TIKOSYN ) 250 MCG capsule Take 1 capsule (250 mcg total) by mouth 2 (two) times daily.   DULoxetine  (CYMBALTA ) 60 MG capsule Take 1 capsule (60 mg total) by mouth daily.   ELIQUIS  5 MG TABS tablet TAKE ONE TABLET BY MOUTH TWICE DAILY   GARLIC  PO Take 1 tablet by mouth daily.   HYDROcodone -acetaminophen  (NORCO) 7.5-325 MG tablet Take 1 tablet by mouth every 6 (six) hours as needed for moderate pain (pain score 4-6).   levothyroxine  (SYNTHROID ) 25 MCG tablet Take 1 tablet (25 mcg total) by mouth daily before breakfast.   lidocaine  (HM LIDOCAINE  PATCH) 4 % Place 2 patches onto the skin daily.   losartan  (COZAAR ) 50 MG tablet Take 1.5 tablets (75 mg total) by mouth daily.   MAGNESIUM  CARBONATE PO Take 1 tablet by mouth at bedtime.   metoprolol  succinate (TOPROL -XL) 100 MG 24 hr tablet TAKE 1 TABLET BY MOUTH DAILY WITH FOOD   nitroGLYCERIN  (NITROSTAT ) 0.4 MG SL tablet DISSOLVE 1 TAB UNDER TOUNGE FOR CHEST PAIN. MAY REPEAT EVERY 5 MINUTES FOR 3 DOSES. IF NO RELIEF CALL  911 OR GO TO ER   rosuvastatin  (CRESTOR ) 20 MG tablet TAKE ONE (1) TABLET BY MOUTH EVERY DAY   Suvorexant  (BELSOMRA ) 5 MG TABS Take 1 tablet (5 mg total) by mouth at bedtime as needed.   timolol  (TIMOPTIC ) 0.5 %  ophthalmic solution Place 1 drop into both eyes daily.   TURMERIC PO Take 1 tablet by mouth daily.   VENTOLIN  HFA 108 (90 Base) MCG/ACT inhaler Inhale 1-2 puffs into the lungs every 4 (four) hours as needed for shortness of breath.   Vitamin D , Cholecalciferol , 10 MCG (400 UNIT) CAPS Take 400 Units by mouth daily.   [DISCONTINUED] metFORMIN  (GLUCOPHAGE ) 500 MG tablet Take 1 tablet (500 mg total) by mouth 2 (two) times daily with a meal.   metFORMIN  (GLUCOPHAGE ) 500 MG tablet Take 1 tablet (500 mg total) by mouth daily with breakfast.   No facility-administered encounter medications on file as of 01/16/2024.    No Known Allergies  Pertinent ROS per HPI, otherwise unremarkable      Objective:  BP (!) 150/78   Pulse 62   Temp 97.8 F (36.6 C)   Ht 5' 4.02 (1.626 m)   Wt 138 lb 9.6 oz (62.9 kg)   LMP  (LMP Unknown)   SpO2 98%   BMI 23.78 kg/m    Wt Readings from Last 3 Encounters:  01/16/24 138 lb 9.6 oz (62.9 kg)  01/08/24 141 lb 12.8 oz (64.3 kg)  01/08/24 140 lb 12.8 oz (63.9 kg)    Physical Exam Vitals and nursing note reviewed.  Constitutional:      General: She is not in acute distress.    Appearance: Normal appearance. She is normal weight. She is not ill-appearing, toxic-appearing or diaphoretic.  HENT:     Head: Normocephalic and atraumatic.     Mouth/Throat:     Mouth: Mucous membranes are moist.     Pharynx: Oropharynx is clear.  Eyes:     Pupils: Pupils are equal, round, and reactive to light.  Cardiovascular:     Rate and Rhythm: Normal rate and regular rhythm.     Heart sounds: Murmur heard.     Systolic murmur is present with a grade of 2/6.  Pulmonary:     Effort: Pulmonary effort is normal.     Breath sounds: Normal breath sounds.   Musculoskeletal:     Cervical back: Neck supple.     Right lower leg: No edema.     Left lower leg: No edema.  Skin:    General: Skin is warm and dry.     Capillary Refill: Capillary refill takes less than 2 seconds.  Neurological:     General: No focal deficit present.     Mental Status: She is alert and oriented to person, place, and time.  Psychiatric:        Mood and Affect: Mood normal.        Behavior: Behavior normal.        Thought Content: Thought content normal.        Judgment: Judgment normal.     Results for orders placed or performed during the hospital encounter of 01/08/24  Glucose, capillary   Collection Time: 01/08/24  2:33 PM  Result Value Ref Range   Glucose-Capillary 124 (H) 70 - 99 mg/dL  Magnesium    Collection Time: 01/09/24  4:52 AM  Result Value Ref Range   Magnesium  2.1 1.7 - 2.4 mg/dL  Basic metabolic panel   Collection Time: 01/09/24  4:52 AM  Result Value Ref Range   Sodium 137 135 - 145 mmol/L   Potassium 5.0 3.5 - 5.1 mmol/L   Chloride 99 98 - 111 mmol/L   CO2 28 22 - 32 mmol/L   Glucose, Bld 119 (H) 70 -  99 mg/dL   BUN 11 8 - 23 mg/dL   Creatinine, Ser 9.00 0.44 - 1.00 mg/dL   Calcium  9.4 8.9 - 10.3 mg/dL   GFR, Estimated 57 (L) >60 mL/min   Anion gap 10 5 - 15  Magnesium    Collection Time: 01/10/24  4:19 AM  Result Value Ref Range   Magnesium  2.1 1.7 - 2.4 mg/dL  Basic metabolic panel   Collection Time: 01/10/24  4:19 AM  Result Value Ref Range   Sodium 136 135 - 145 mmol/L   Potassium 4.5 3.5 - 5.1 mmol/L   Chloride 100 98 - 111 mmol/L   CO2 27 22 - 32 mmol/L   Glucose, Bld 117 (H) 70 - 99 mg/dL   BUN 11 8 - 23 mg/dL   Creatinine, Ser 9.11 0.44 - 1.00 mg/dL   Calcium  9.3 8.9 - 10.3 mg/dL   GFR, Estimated >39 >39 mL/min   Anion gap 9 5 - 15  Glucose, capillary   Collection Time: 01/10/24 10:43 AM  Result Value Ref Range   Glucose-Capillary 117 (H) 70 - 99 mg/dL  Magnesium    Collection Time: 01/11/24  4:27 AM  Result  Value Ref Range   Magnesium  2.2 1.7 - 2.4 mg/dL  Basic metabolic panel   Collection Time: 01/11/24  4:27 AM  Result Value Ref Range   Sodium 136 135 - 145 mmol/L   Potassium 4.6 3.5 - 5.1 mmol/L   Chloride 99 98 - 111 mmol/L   CO2 27 22 - 32 mmol/L   Glucose, Bld 119 (H) 70 - 99 mg/dL   BUN 13 8 - 23 mg/dL   Creatinine, Ser 9.20 0.44 - 1.00 mg/dL   Calcium  9.4 8.9 - 10.3 mg/dL   GFR, Estimated >39 >39 mL/min   Anion gap 10 5 - 15       Pertinent labs & imaging results that were available during my care of the patient were reviewed by me and considered in my medical decision making.  Assessment & Plan:  [name] was seen today for medical management of chronic issues.  Diagnoses and all orders for this visit:  Type 2 diabetes mellitus with other specified complication, without long-term current use of insulin  (HCC) -     Bayer DCA Hb A1c Waived -     metFORMIN  (GLUCOPHAGE ) 500 MG tablet; Take 1 tablet (500 mg total) by mouth daily with breakfast.  Hyperlipidemia associated with type 2 diabetes mellitus (HCC) -     Bayer DCA Hb A1c Waived  Hypertension associated with diabetes (HCC) -     Bayer DCA Hb A1c Waived  Persistent atrial fibrillation (HCC) Regular rhythm on auscultation today, SR per EKO  Acquired hypothyroidism Has follow up with endocrinology scheduled.     Assessment and Plan    Atrial fibrillation status post cardioversion Status post successful cardioversion with no irregular heartbeats or palpitations. Currently in sinus rhythm. No side effects from Tikosyn . No pacemaker, only a defibrillator in place. - Continue Tikosyn  as prescribed - Follow up with cardiology next month for potential medication adjustments - Will schedule EKG and possibly an echocardiogram to assess heart function  Type 2 diabetes mellitus Currently managed with metformin . A1c is 5.9. No side effects from metformin . Weight loss noted, possibly related to metformin  use. - Reduced  metformin  to 500 mg once daily - Sent new prescription for metformin  once daily - Monitor A1c to prevent hypoglycemia  Unintentional weight loss Weight loss of 2 pounds since last visit. Possible  contributing factors include thyroid  dysfunction and metformin  use. Reports lack of appetite and altered taste. - Reduced metformin  to once daily to assess impact on weight - Will coordinate with endocrinologist to evaluate thyroid  function  Insomnia Current treatment with Belsomra  5 mg is ineffective. No daytime drowsiness reported. - Increase Belsomra  to 10 mg by taking two 5 mg tablets - If effective, will prescribe 10 mg tablet - If ineffective, will consider alternative treatments          Continue all other maintenance medications.  Follow up plan: Return in 3 months (on 04/17/2024), or if symptoms worsen or fail to improve, for HTN, DM.   Continue healthy lifestyle choices, including diet (rich in fruits, vegetables, and lean proteins, and low in salt and simple carbohydrates) and exercise (at least 30 minutes of moderate physical activity daily).  Educational handout given for DM  The above assessment and management plan was discussed with the patient. The patient verbalized understanding of and has agreed to the management plan. Patient is aware to call the clinic if they develop any new symptoms or if symptoms persist or worsen. Patient is aware when to return to the clinic for a follow-up visit. Patient educated on when it is appropriate to go to the emergency department.   Rosaline Bruns, FNP-C [facility] [phone]

## 2024-01-16 NOTE — Patient Instructions (Addendum)

## 2024-01-21 ENCOUNTER — Ambulatory Visit (HOSPITAL_COMMUNITY)
Admit: 2024-01-21 | Discharge: 2024-01-21 | Disposition: A | Discharge status: Home / Self Care | Attending: Physician Assistant | Admitting: Physician Assistant

## 2024-01-21 VITALS — BP 116/60 | HR 66 | Ht 64.02 in | Wt 139.0 lb

## 2024-01-21 DIAGNOSIS — Z79899 Other long term (current) drug therapy: Secondary | ICD-10-CM

## 2024-01-21 DIAGNOSIS — D6869 Other thrombophilia: Secondary | ICD-10-CM | POA: Diagnosis not present

## 2024-01-21 DIAGNOSIS — Z5181 Encounter for therapeutic drug level monitoring: Secondary | ICD-10-CM

## 2024-01-21 DIAGNOSIS — I4891 Unspecified atrial fibrillation: Secondary | ICD-10-CM | POA: Diagnosis not present

## 2024-01-21 DIAGNOSIS — I4819 Other persistent atrial fibrillation: Secondary | ICD-10-CM | POA: Diagnosis not present

## 2024-01-21 MED ORDER — DOFETILIDE 250 MCG PO CAPS: 250.0000 ug | ORAL_CAPSULE | Freq: Two times a day (BID) | ORAL | 3 refills | Status: AC

## 2024-01-21 NOTE — Progress Notes (Signed)
 Primary Care Physician: Severa Rock HERO, FNP Primary Cardiologist: Dr Debera Primary Electrophysiologist: Dr Nancey  Referring Physician: Dr Debera Pagan [name] is a 83 y.o. female with a history of CAD, HTN, DM, HLD, CHF, atrial flutter, and persistent atrial fibrillation who presents for follow up in the [facility]. The patient was initially diagnosed with atrial fibrillation on 11/2017 after presenting with symptoms of palpitations and chest pain to ER and found to be in afib with RVR. She was discharged with Eliquis  and rate control with plan to cardiovert on f/u but she had converted on her own. She denies snoring or significant alcohol use. Patient was seen at the ER 11/11/18 with symptoms of SOB, chest tightness, and palpitations for 3 days. She was found to be in afib with RVR and underwent successful DCCV. Zio patch prior to this episode showed no afib but did show very frequent short episodes of SVT. She was started on amiodarone  which controlled her afib but unfortunately she developed hypothyroidism and this was discontinued.   Patient is s/p afib ablation with Dr Kelsie on 02/16/21. There were multiple atypical atrial flutter circuits noted. Post ablation, she had an increase in edema in her right leg, going up to her mid thigh. She started Lasix  daily with only minimal improvement. She reports compliance with Eliquis  and Plavix . Venous u/s showed DVT in R common femoral vein. She saw Dr Sheree and recommendation was to continue Eliquis  and use compression stockings with no other intervention.   She was seen at the ED 12/11/23 with symptoms of palpitations. ECG suspicious for atrial flutter at that time. Pro BNP elevated at 2634. Her ILR shows 1.6% afib burden but suspect undersensing based on heart rate trends.   Patient returns for follow up for atrial fibrillation and dofetilide  monitoring. She is s/p dofetilide  loading 11/18-11/21/25 with DCCV on  01/10/24. She is in SR today and feels well. She states she has more energy in SR. No bleeding issues on anticoagulation.   Today, she  denies symptoms of palpitations, chest pain, shortness of breath, orthopnea, PND, lower extremity edema, dizziness, presyncope, syncope, snoring, daytime somnolence, bleeding, or neurologic sequela. The patient is tolerating medications without difficulties and is otherwise without complaint today.    Atrial Fibrillation Risk Factors:  she does not have symptoms or diagnosis of sleep apnea. she does not have a history of rheumatic fever. she does not have a history of alcohol use. The patient does not have a history of early familial atrial fibrillation or other arrhythmias.   Atrial Fibrillation Management history:  Previous antiarrhythmic drugs: amiodarone , dofetilide    Previous cardioversions: 11/11/18, 01/10/24 Previous ablations: 02/16/21 Anticoagulation history: Eliquis    Past Medical History:  Diagnosis Date   Anxiety    Arthritis    Bulging lumbar disc    Chronic lower back pain    Coronary atherosclerosis of native coronary artery    DES LAD 02/2008, DES LAD 02/2015, DES ramus and DES x2 mid RCA 02/2020   Depression    Dyslipidemia    Essential hypertension    Facial numbness    Frequent headaches    GERD (gastroesophageal reflux disease)    Glaucoma    Heart attack (HCC) 02/2020   Insomnia    Paroxysmal atrial fibrillation (HCC)    Diagnosed October 2019   PSVT (paroxysmal supraventricular tachycardia)    Refusal of blood transfusions as patient is Jehovah's Witness    Thyroid  disease  Type 2 diabetes mellitus (HCC)    Borderline   Vitamin D  deficiency      Current Outpatient Medications  Medication Sig Dispense Refill   acetaminophen  (TYLENOL ) 500 MG tablet Take 1,000 mg by mouth every 6 (six) hours as needed for moderate pain. (Patient taking differently: Take 1,000 mg by mouth as needed for moderate pain (pain score  4-6).)     Ascorbic Acid  (VITAMIN C ) 500 MG tablet Take 500 mg by mouth daily.       bimatoprost (LUMIGAN) 0.01 % SOLN Place 1 drop into both eyes at bedtime.     Calcium  Carbonate Antacid (TUMS PO) Take 2 tablets by mouth 4 (four) times daily as needed (for acid reflux/indigestion).      Camphor-Menthol -Methyl Sal (SALONPAS ) 3.02-26-08 % PTCH Apply 1 patch topically daily as needed (pain).     cetirizine (ZYRTEC) 10 MG tablet Take 10 mg by mouth daily as needed for allergies.     Cyanocobalamin  (VITAMIN B 12 PO) Take 1 tablet by mouth daily.     diclofenac  Sodium (VOLTAREN ) 1 % GEL Apply 2 g topically 4 (four) times daily. (Patient taking differently: Apply 2 g topically as needed.) 50 g 2   diltiazem  (CARDIZEM  CD) 300 MG 24 hr capsule Take 1 capsule (300 mg total) by mouth daily. 90 capsule 2   diltiazem  (CARDIZEM ) 60 MG tablet Take 1 every 8 hours as needed for fast heart rate and palpitations 60 tablet 1   dofetilide  (TIKOSYN ) 250 MCG capsule Take 1 capsule (250 mcg total) by mouth 2 (two) times daily. 60 capsule 3   DULoxetine  (CYMBALTA ) 60 MG capsule Take 1 capsule (60 mg total) by mouth daily. 90 capsule 1   ELIQUIS  5 MG TABS tablet TAKE ONE TABLET BY MOUTH TWICE DAILY 60 tablet 5   GARLIC  PO Take 1 tablet by mouth daily.     HYDROcodone -acetaminophen  (NORCO) 7.5-325 MG tablet Take 1 tablet by mouth every 6 (six) hours as needed for moderate pain (pain score 4-6). 30 tablet 0   levothyroxine  (SYNTHROID ) 25 MCG tablet Take 1 tablet (25 mcg total) by mouth daily before breakfast. 90 tablet 3   lidocaine  (HM LIDOCAINE  PATCH) 4 % Place 2 patches onto the skin daily. (Patient taking differently: Place 2 patches onto the skin as needed.) 10 patch 0   losartan  (COZAAR ) 50 MG tablet Take 1.5 tablets (75 mg total) by mouth daily. 135 tablet 3   MAGNESIUM  CARBONATE PO Take 1 tablet by mouth at bedtime. (Patient taking differently: Take 400 mg by mouth at bedtime.)     metFORMIN  (GLUCOPHAGE ) 500 MG  tablet Take 1 tablet (500 mg total) by mouth daily with breakfast. 180 tablet 0   metoprolol  succinate (TOPROL -XL) 100 MG 24 hr tablet TAKE 1 TABLET BY MOUTH DAILY WITH FOOD 90 tablet 1   nitroGLYCERIN  (NITROSTAT ) 0.4 MG SL tablet DISSOLVE 1 TAB UNDER TOUNGE FOR CHEST PAIN. MAY REPEAT EVERY 5 MINUTES FOR 3 DOSES. IF NO RELIEF CALL 911 OR GO TO ER 25 tablet 2   rosuvastatin  (CRESTOR ) 20 MG tablet TAKE ONE (1) TABLET BY MOUTH EVERY DAY 90 tablet 1   Suvorexant  (BELSOMRA ) 5 MG TABS Take 1 tablet (5 mg total) by mouth at bedtime as needed. 30 tablet 0   timolol  (TIMOPTIC ) 0.5 % ophthalmic solution Place 1 drop into both eyes daily.     TURMERIC PO Take 1 tablet by mouth daily.     VENTOLIN  HFA 108 (90 Base) MCG/ACT inhaler Inhale  1-2 puffs into the lungs every 4 (four) hours as needed for shortness of breath. 18 g 1   Vitamin D , Cholecalciferol , 10 MCG (400 UNIT) CAPS Take 400 Units by mouth daily.     No current facility-administered medications for this encounter.    ROS- All systems are reviewed and negative except as per the HPI above.  Physical Exam: Vitals:   01/21/24 1537  BP: 116/60  Pulse: 66  Weight: 63 kg  Height: 5' 4.02 (1.626 m)    GEN: Well nourished, well developed in no acute distress CARDIAC: Regular rate and rhythm, no murmurs, rubs, gallops RESPIRATORY:  Clear to auscultation without rales, wheezing or rhonchi  ABDOMEN: Soft, non-tender, non-distended EXTREMITIES:  No edema; No deformity    Wt Readings from Last 3 Encounters:  01/21/24 63 kg  01/16/24 62.9 kg  01/08/24 64.3 kg    EKG Interpretation Date/Time:  Monday January 21 2024 15:47:26 EST Ventricular Rate:  66 PR Interval:  228 QRS Duration:  72 QT Interval:  436 QTC Calculation: 457 R Axis:   22  Text Interpretation: Sinus rhythm with 1st degree A-V block Possible Anterior infarct , age undetermined Abnormal ECG When compared with ECG of 11-Jan-2024 10:47, No significant change was found  Confirmed by Kenyia Wambolt ([id number]) on 01/21/2024 3:50:47 PM    Echo 12/31/23 demonstrated   1. Left ventricular ejection fraction, by estimation, is 55 to 60%. The  left ventricle has normal function. The left ventricle has no regional  wall motion abnormalities. Left ventricular diastolic parameters are  indeterminate.   2. Right ventricular systolic function is low normal. The right  ventricular size is normal. There is moderately elevated pulmonary artery  systolic pressure.   3. The mitral valve is abnormal. Mild mitral valve regurgitation. Mild  mitral stenosis. The mean mitral valve gradient is 5.0 mmHg.Heart rate 102  bpm. Moderate mitral annular calcification.   4. The tricuspid valve is abnormal. Tricuspid valve regurgitation is  moderate.   5. The aortic valve is tricuspid. There is mild calcification of the  aortic valve. There is mild thickening of the aortic valve. Aortic valve  regurgitation is moderate. No aortic stenosis is present.   6. The inferior vena cava is normal in size with greater than 50%  respiratory variability, suggesting right atrial pressure of 3 mmHg.   Comparison(s): A prior study was performed on 11//22/2023. EF 55-60%. RV  systolic function mildly reduced. Severe MAC. Moderate MR. Mild mitral  stenosis. Moderate calcification of the aortic valve. Mild AS. Mild to  moderate AI.    Epic records are reviewed at length today   CHA2DS2-VASc Score = 7  The patient's score is based upon: CHF History: 1 HTN History: 1 Diabetes History: 1 Stroke History: 0 Vascular Disease History: 1 Age Score: 2 Gender Score: 1       ASSESSMENT AND PLAN: Persistent Atrial Fibrillation/atrial flutter (ICD10:  I48.19) The patient's CHA2DS2-VASc score is 7, indicating a 11.2% annual risk of stroke.   Previously failed amiodarone  due to thyroid  side effects. S/p dofetilide  admission 12/2023. Patient appears to be maintaining SR Continue dofetilide  250 mcg  BID Continue Eliquis  5 mg BID Continue diltiazem  300 mg daily Continue Toprol  100 mg daily  Secondary Hypercoagulable State (ICD10:  D68.69) The patient is at significant risk for stroke/thromboembolism based upon her CHA2DS2-VASc Score of 7.  Continue Apixaban  (Eliquis ). No bleeding issues.   High Risk Medication Monitoring (ICD 10: [id number]) Patient requires ongoing monitoring for anti-arrhythmic medication  which has the potential to cause life threatening arrhythmias. QT interval on ECG acceptable for dofetilide  monitoring. Check bmet/mag today.     CAD S/p several stents No anginal symptoms Followed by Dr Debera  HTN Stable on current regimen  Chronic HFpEF EF 55-60% GDMT per primary cardiology team Fluid status appears stable today   Follow up in the AF clinic in one month.    Daril Kicks PA-C [facility] [address] [place], [place] [address] [phone] 01/21/2024 3:51 PM

## 2024-01-22 ENCOUNTER — Ambulatory Visit (HOSPITAL_COMMUNITY): Payer: Self-pay | Admitting: Physician Assistant

## 2024-01-22 LAB — BASIC METABOLIC PANEL WITH GFR
BUN/Creatinine Ratio: 17 (ref 12–28)
BUN: 14 mg/dL (ref 8–27)
CO2: 22 mmol/L (ref 20–29)
Calcium: 9.9 mg/dL (ref 8.7–10.3)
Chloride: 101 mmol/L (ref 96–106)
Creatinine, Ser: 0.82 mg/dL (ref 0.57–1.00)
Glucose: 107 mg/dL — ABNORMAL HIGH (ref 70–99)
Potassium: 4.5 mmol/L (ref 3.5–5.2)
Sodium: 139 mmol/L (ref 134–144)
eGFR: 71 mL/min/1.73 (ref >59)

## 2024-01-22 LAB — MAGNESIUM: Magnesium: 2.2 mg/dL (ref 1.6–2.3)

## 2024-01-24 ENCOUNTER — Other Ambulatory Visit: Payer: Self-pay | Admitting: *Deleted

## 2024-01-24 NOTE — Patient Instructions (Signed)
 Visit Information  Thank you for taking time to visit with me today. Please don't hesitate to contact me if I can be of assistance to you before our next scheduled telephone appointment.  Our next appointment is by telephone on 02/07/24 @ 115 pm  Following is a copy of your care plan:   Goals Addressed             This Visit's Progress    VBCI Transitions of Care (TOC) Care Plan       Problems:  Recent Hospitalization for treatment of Atrial Fibrillation Patient requests RN CM skip telephone outreach week of 01/28/24 due to she is very busy  Goal:  Over the next 30 days, the patient will not experience hospital readmission  Interventions:  AFIB Interventions:  Counseled on increased risk of stroke due to Afib and benefits of anticoagulation for stroke prevention Reviewed importance of adherence to anticoagulant exactly as prescribed Counseled on bleeding risk associated with taking anticoagulant and importance of self-monitoring for signs/symptoms of bleeding Counseled on importance of regular laboratory monitoring as prescribed Reinforced Atrial Fibrillation action plan Reviewed all upcoming scheduled appointments  Patient Self Care Activities:  Attend all scheduled provider appointments Attend church or other social activities Call pharmacy for medication refills 3-7 days in advance of running out of medications Call provider office for new concerns or questions  Notify RN Care Manager of TOC call rescheduling needs Participate in Transition of Care Program/Attend TOC scheduled calls Perform all self care activities independently  Take medications as prescribed   check pulse (heart) rate once a day make a plan to exercise regularly make a plan to eat healthy keep all lab appointments take medicine as prescribed wear medical alert identification  Plan:  Telephone follow up appointment with care management team member scheduled for:  pt request to skip week of 12/8,   next outreach will be 02/07/24 @ 115 pm The patient has been provided with contact information for the care management team and has been advised to call with any health related questions or concerns.         Care plan and visit instructions communicated with the patient verbally today. Patient agrees to receive a copy in MyChart. Active MyChart status and patient understanding of how to access instructions and care plan via MyChart confirmed with patient.     Telephone follow up appointment with care management team member scheduled for: 02/07/24 @ 115 pm  Please call the care guide team at [phone] if you need to cancel or reschedule your appointment.   Please call the Suicide and Crisis Lifeline: 988 call the [place]  National Suicide Prevention Lifeline: 949-221-2517 or TTY: 657-605-3682 TTY (904)674-4330) to talk to a trained counselor call 1-800-273-TALK (toll free, 24 hour hotline) go to [facility] [address] [place] [phone]) call the [facility] Crisis Line: [phone] call 911 if you are experiencing a Mental Health or Behavioral Health Crisis or need someone to talk to.  Mliss Creed [facility], BSN RN Care Manager/ Transition of Care [place]/ [facility] [phone]

## 2024-01-24 NOTE — Patient Outreach (Signed)
 Transition of Care week 2  Visit Note  01/24/2024  Name: [name] MRN: [id number]          DOB: 1940/06/03  Situation: Patient enrolled in [facility] 30-day program. Visit completed with patient by telephone.   Background:  Discharge Date and Diagnosis: 01/11/24, Atrial Flutter   Past Medical History:  Diagnosis Date   Anxiety    Arthritis    Bulging lumbar disc    Chronic lower back pain    Coronary atherosclerosis of native coronary artery    DES LAD 02/2008, DES LAD 02/2015, DES ramus and DES x2 mid RCA 02/2020   Depression    Dyslipidemia    Essential hypertension    Facial numbness    Frequent headaches    GERD (gastroesophageal reflux disease)    Glaucoma    Heart attack (HCC) 02/2020   Insomnia    Paroxysmal atrial fibrillation (HCC)    Diagnosed October 2019   PSVT (paroxysmal supraventricular tachycardia)    Refusal of blood transfusions as patient is Jehovah's Witness    Thyroid  disease    Type 2 diabetes mellitus (HCC)    Borderline   Vitamin D  deficiency     Assessment: Patient Reported Symptoms: Cognitive Cognitive Status: No symptoms reported, Alert and oriented to person, place, and time, Able to follow simple commands, Normal speech and language skills      Neurological Neurological Review of Symptoms: No symptoms reported    HEENT HEENT Symptoms Reported: Other: (pt reports chronic dry mouth, food tastes odd, tongue burning, went to ENT and other doctors, tried multiple medications, mouth washes, pt states  nothing works, they have tried) HEENT Management Strategies: Routine screening, Adequate rest HEENT Self-Management Outcome: 2 (bad)    Cardiovascular Cardiovascular Symptoms Reported: No symptoms reported Does patient have uncontrolled Hypertension?: No Cardiovascular Comment: reinforced A-Fib action plan  Respiratory Respiratory Symptoms Reported: No symptoms reported    Endocrine Endocrine Symptoms Reported: No symptoms reported Is patient  diabetic?: Yes Is patient checking blood sugars at home?: No Endocrine Self-Management Outcome: 4 (good)  Gastrointestinal Gastrointestinal Symptoms Reported: No symptoms reported      Genitourinary Genitourinary Symptoms Reported: No symptoms reported    Integumentary Integumentary Symptoms Reported: No symptoms reported    Musculoskeletal Musculoskelatal Symptoms Reviewed: No symptoms reported Additional Musculoskeletal Details: cane/ walker- uses only when leaves home Musculoskeletal Management Strategies: Routine screening, Medical device, Adequate rest Musculoskeletal Self-Management Outcome: 4 (good)      Psychosocial Psychosocial Symptoms Reported: No symptoms reported         There were no vitals filed for this visit. Pain Scale: 0-10 Pain Score: 0-No pain  Medications Reviewed Today     Reviewed by Aura Mliss LABOR, RN (Registered Nurse) on 01/24/24 at 1316  Med List Status: <None>   Medication Order Taking? Sig Documenting Provider Last Dose Status Informant  acetaminophen  (TYLENOL ) 500 MG tablet [id number]  Take 1,000 mg by mouth every 6 (six) hours as needed for moderate pain.  Patient taking differently: Take 1,000 mg by mouth as needed for moderate pain (pain score 4-6).   [provider]  Active Self, Pharmacy Records  Ascorbic Acid  (VITAMIN C ) 500 MG tablet [id number]  Take 500 mg by mouth daily.   [provider]  Active Self, Pharmacy Records  bimatoprost (LUMIGAN) 0.01 % SOLN [id number]  Place 1 drop into both eyes at bedtime. [provider]  Active Self, Pharmacy Records  Calcium  Carbonate Antacid (TUMS PO) [id number]  Take 2  tablets by mouth 4 (four) times daily as needed (for acid reflux/indigestion).  [provider]  Active Self, Pharmacy Records  Camphor-Menthol -Methyl Sal (SALONPAS ) 3.02-26-08 % PTCH [id number]  Apply 1 patch topically daily as needed (pain). [provider]  Active Self, Pharmacy Records  cetirizine  (ZYRTEC) 10 MG tablet [id number]  Take 10 mg by mouth daily as needed for allergies. [provider]  Active Self, Pharmacy Records  Cyanocobalamin  (VITAMIN B 12 PO) [id number]  Take 1 tablet by mouth daily. [provider]  Active Self, Pharmacy Records  diclofenac  Sodium (VOLTAREN ) 1 % GEL [id number]  Apply 2 g topically 4 (four) times daily.  Patient taking differently: Apply 2 g topically as needed.   Cherylene Homer HERO, NP  Active Self, Pharmacy Records  diltiazem  (CARDIZEM  CD) 300 MG 24 hr capsule [id number]  Take 1 capsule (300 mg total) by mouth daily. [name], MD  Active Self, Pharmacy Records  diltiazem  (CARDIZEM ) 60 MG tablet [id number]  Take 1 every 8 hours as needed for fast heart rate and palpitations Suzette Pac, MD  Active Self, Pharmacy Records  dofetilide  (TIKOSYN ) 250 MCG capsule [id number]  Take 1 capsule (250 mcg total) by mouth 2 (two) times daily. Fenton, Clint R, PA  Active   DULoxetine  (CYMBALTA ) 60 MG capsule [id number]  Take 1 capsule (60 mg total) by mouth daily. Severa Rock HERO, FNP  Active Self, Pharmacy Records  ELIQUIS  5 MG TABS tablet [id number]  TAKE ONE TABLET BY MOUTH TWICE DAILY [name], MD  Active Self, Pharmacy Records  GARLIC  PO [id number]  Take 1 tablet by mouth daily. [provider]  Active Self, Pharmacy Records  HYDROcodone -acetaminophen  (NORCO) 7.5-325 MG tablet [id number]  Take 1 tablet by mouth every 6 (six) hours as needed for moderate pain (pain score 4-6). Severa Rock HERO, FNP  Active Self, Pharmacy Records  levothyroxine  (SYNTHROID ) 25 MCG tablet [id number]  Take 1 tablet (25 mcg total) by mouth daily before breakfast. [name], NP  Active Self, Pharmacy Records  lidocaine  (HM LIDOCAINE  PATCH) 4 % [id number]  Place 2 patches onto the skin daily.  Patient taking differently: Place 2 patches onto the skin as needed.   Cathlene Marry Lenis, FNP  Active Self, Pharmacy Records  losartan  (COZAAR ) 50 MG  tablet [id number]  Take 1.5 tablets (75 mg total) by mouth daily. [name], MD  Active Self, Pharmacy Records  MAGNESIUM  CARBONATE PO [id number]  Take 1 tablet by mouth at bedtime.  Patient taking differently: Take 400 mg by mouth at bedtime.   [provider]  Active Self, Pharmacy Records  metFORMIN  (GLUCOPHAGE ) 500 MG tablet [id number]  Take 1 tablet (500 mg total) by mouth daily with breakfast. Severa Rock HERO, FNP  Active   metoprolol  succinate (TOPROL -XL) 100 MG 24 hr tablet [id number]  TAKE 1 TABLET BY MOUTH DAILY WITH FOOD [name], MD  Active Self, Pharmacy Records  nitroGLYCERIN  (NITROSTAT ) 0.4 MG SL tablet [id number]  DISSOLVE 1 TAB UNDER TOUNGE FOR CHEST PAIN. MAY REPEAT EVERY 5 MINUTES FOR 3 DOSES. IF NO RELIEF CALL 911 OR GO TO ER [name], MD  Active Self, Pharmacy Records  rosuvastatin  (CRESTOR ) 20 MG tablet [id number]  TAKE ONE (1) TABLET BY MOUTH EVERY DAY [name], MD  Active Self, Pharmacy Records  Suvorexant  (BELSOMRA ) 5 MG TABS [id number]  Take 1 tablet (5 mg total) by mouth at bedtime as needed. Rakes, Rock HERO, FNP  Active Self, Pharmacy Records  timolol  (TIMOPTIC ) 0.5 % ophthalmic solution [id number]  Place 1 drop into both eyes daily. [provider]  Active Self, Pharmacy Records  TURMERIC PO [id number]  Take 1 tablet by mouth daily. [provider]  Active Self, Pharmacy Records  VENTOLIN  HFA 108 [phone] Base) MCG/ACT inhaler [id number]  Inhale 1-2 puffs into the lungs every 4 (four) hours as needed for shortness of breath. Pearlean Manus, MD  Active Self, Pharmacy Records  Vitamin D , Cholecalciferol , 10 MCG (400 UNIT) CAPS [id number]  Take 400 Units by mouth daily. [provider]  Active Self, Pharmacy Records            Goals Addressed             This Visit's Progress    VBCI Transitions of Care (TOC) Care Plan       Problems:  Recent Hospitalization for treatment of Atrial Fibrillation Patient  requests RN CM skip telephone outreach week of 01/28/24 due to she is very busy  Goal:  Over the next 30 days, the patient will not experience hospital readmission  Interventions:  AFIB Interventions:  Counseled on increased risk of stroke due to Afib and benefits of anticoagulation for stroke prevention Reviewed importance of adherence to anticoagulant exactly as prescribed Counseled on bleeding risk associated with taking anticoagulant and importance of self-monitoring for signs/symptoms of bleeding Counseled on importance of regular laboratory monitoring as prescribed Reinforced Atrial Fibrillation action plan Reviewed all upcoming scheduled appointments  Patient Self Care Activities:  Attend all scheduled provider appointments Attend church or other social activities Call pharmacy for medication refills 3-7 days in advance of running out of medications Call provider office for new concerns or questions  Notify RN Care Manager of TOC call rescheduling needs Participate in Transition of Care Program/Attend TOC scheduled calls Perform all self care activities independently  Take medications as prescribed   check pulse (heart) rate once a day make a plan to exercise regularly make a plan to eat healthy keep all lab appointments take medicine as prescribed wear medical alert identification  Plan:  Telephone follow up appointment with care management team member scheduled for:  pt request to skip week of 12/8,  next outreach will be 02/07/24 @ 115 pm The patient has been provided with contact information for the care management team and has been advised to call with any health related questions or concerns.          Recommendation:   PCP Follow-up  Follow Up Plan:   Telephone follow-up 02/07/24 @ 115 pm  Mliss Creed [facility], BSN RN Care Manager/ Transition of Care [place]/ [facility] [phone]

## 2024-01-28 ENCOUNTER — Other Ambulatory Visit: Payer: Self-pay | Admitting: Cardiology

## 2024-01-28 ENCOUNTER — Telehealth: Payer: Self-pay | Admitting: Family Medicine

## 2024-01-28 DIAGNOSIS — E1169 Type 2 diabetes mellitus with other specified complication: Secondary | ICD-10-CM

## 2024-01-28 NOTE — Telephone Encounter (Unsigned)
 Copied from CRM [id number]. Topic: Clinical - Medication Refill >> Jan 28, 2024  9:18 AM Suzette B wrote: Medication:  metFORMIN  (GLUCOPHAGE ) 500 MG tablet Suvorexant  (BELSOMRA ) 5 MG TABS - provider stated to pt she was going to up the dosage because the 5mg  wasn't working  Has the patient contacted their pharmacy? No she called the clinic before calling the pharmacy she stated she was concerned up the up dosage on the medication  This is the patient's preferred pharmacy:  [facility], [place] - [address] [place] [address] Phone: [phone] Fax: [phone]    Is this the correct pharmacy for this prescription? Yes If no, delete pharmacy and type the correct one.   Has the prescription been filled recently? Yes  Is the patient out of the medication? No  Has the patient been seen for an appointment in the last year OR does the patient have an upcoming appointment? Yes  Can we respond through MyChart? No  Agent: Please be advised that Rx refills may take up to 3 business days. We ask that you follow-up with your pharmacy.

## 2024-01-29 NOTE — Telephone Encounter (Signed)
 Pt requesting increased dosage of Belsomra , not pended for review

## 2024-01-30 ENCOUNTER — Encounter

## 2024-01-30 MED ORDER — METFORMIN HCL 500 MG PO TABS: 500.0000 mg | ORAL_TABLET | Freq: Every day | ORAL | 0 refills | Status: AC

## 2024-01-31 ENCOUNTER — Encounter

## 2024-02-01 ENCOUNTER — Ambulatory Visit: Attending: Cardiovascular Disease

## 2024-02-01 DIAGNOSIS — I4891 Unspecified atrial fibrillation: Secondary | ICD-10-CM

## 2024-02-02 LAB — CUP PACEART REMOTE DEVICE CHECK
Date Time Interrogation Session: 20251211230625
Implantable Pulse Generator Implant Date: 20220210

## 2024-02-06 ENCOUNTER — Telehealth: Payer: Self-pay

## 2024-02-06 NOTE — Telephone Encounter (Signed)
 Copied from CRM [id number]. Topic: Clinical - Medication Question >> Feb 06, 2024 11:54 AM Delon DASEN wrote: Reason for CRM: Suvorexant  (BELSOMRA ) 5 MG TABS-patient still waiting to hear if there will be an increase in dosage due to it not working- please call if any questions- [phone]

## 2024-02-06 NOTE — Telephone Encounter (Signed)
 For PCP to advise on

## 2024-02-06 NOTE — Telephone Encounter (Signed)
 Lmtcb

## 2024-02-07 ENCOUNTER — Telehealth: Payer: Self-pay | Admitting: *Deleted

## 2024-02-07 ENCOUNTER — Telehealth: Admitting: *Deleted

## 2024-02-07 NOTE — Patient Outreach (Signed)
 Transition of Care week 3  Visit Note  02/07/2024  Name: [name] MRN: [id number]          DOB: 1940/05/02  Situation: Patient enrolled in [facility] 30-day program. Visit completed with patient by telephone.   Background:  Discharge Date and Diagnosis: 01/11/24, Atrial Flutter   Past Medical History:  Diagnosis Date   Anxiety    Arthritis    Bulging lumbar disc    Chronic lower back pain    Coronary atherosclerosis of native coronary artery    DES LAD 02/2008, DES LAD 02/2015, DES ramus and DES x2 mid RCA 02/2020   Depression    Dyslipidemia    Essential hypertension    Facial numbness    Frequent headaches    GERD (gastroesophageal reflux disease)    Glaucoma    Heart attack (HCC) 02/2020   Insomnia    Paroxysmal atrial fibrillation (HCC)    Diagnosed October 2019   PSVT (paroxysmal supraventricular tachycardia)    Refusal of blood transfusions as patient is Jehovah's Witness    Thyroid  disease    Type 2 diabetes mellitus (HCC)    Borderline   Vitamin D  deficiency     Assessment: Patient Reported Symptoms: Cognitive Cognitive Status: No symptoms reported, Alert and oriented to person, place, and time, Able to follow simple commands, Normal speech and language skills      Neurological Neurological Review of Symptoms: No symptoms reported    HEENT HEENT Symptoms Reported: Other: (pt reports chronic dry mouth, food tastes odd, tongue burning, went to ENT and other doctors, tried multiple medications, mouth washes, pt states  nothing works, they have tried)      Cardiovascular Cardiovascular Symptoms Reported: No symptoms reported Does patient have uncontrolled Hypertension?: No Cardiovascular Self-Management Outcome: 3 (uncertain) Cardiovascular Comment: Reviewed A-Fib action plan  Respiratory Respiratory Symptoms Reported: No symptoms reported    Endocrine Endocrine Symptoms Reported: No symptoms reported Is patient diabetic?: Yes Is patient checking blood  sugars at home?: No Endocrine Self-Management Outcome: 4 (good)  Gastrointestinal Gastrointestinal Symptoms Reported: No symptoms reported      Genitourinary Genitourinary Symptoms Reported: No symptoms reported    Integumentary Integumentary Symptoms Reported: No symptoms reported    Musculoskeletal Musculoskelatal Symptoms Reviewed: No symptoms reported Additional Musculoskeletal Details: cane/ walker - uses only when leaves home Musculoskeletal Management Strategies: Adequate rest, Routine screening, Medical device Musculoskeletal Self-Management Outcome: 4 (good)      Psychosocial Psychosocial Symptoms Reported: No symptoms reported         There were no vitals filed for this visit. Pain Scale: 0-10 Pain Score: 0-No pain  Medications Reviewed Today     Reviewed by Aura Mliss LABOR, RN (Registered Nurse) on 02/07/24 at 1337  Med List Status: <None>   Medication Order Taking? Sig Documenting Provider Last Dose Status Informant  acetaminophen  (TYLENOL ) 500 MG tablet [id number]  Take 1,000 mg by mouth every 6 (six) hours as needed for moderate pain.  Patient taking differently: Take 1,000 mg by mouth as needed for moderate pain (pain score 4-6).   [provider]  Active Self, Pharmacy Records  Ascorbic Acid  (VITAMIN C ) 500 MG tablet [id number]  Take 500 mg by mouth daily.   [provider]  Active Self, Pharmacy Records  bimatoprost (LUMIGAN) 0.01 % SOLN [id number]  Place 1 drop into both eyes at bedtime. [provider]  Active Self, Pharmacy Records  Calcium  Carbonate Antacid (TUMS PO) [id number]  Take 2 tablets by mouth 4 (  four) times daily as needed (for acid reflux/indigestion).  [provider]  Active Self, Pharmacy Records  Camphor-Menthol -Methyl Sal (SALONPAS ) 3.02-26-08 % PTCH [id number]  Apply 1 patch topically daily as needed (pain). [provider]  Active Self, Pharmacy Records  cetirizine (ZYRTEC) 10 MG tablet [id number]  Take 10 mg  by mouth daily as needed for allergies. [provider]  Active Self, Pharmacy Records  Cyanocobalamin  (VITAMIN B 12 PO) [id number]  Take 1 tablet by mouth daily. [provider]  Active Self, Pharmacy Records  diclofenac  Sodium (VOLTAREN ) 1 % GEL [id number]  Apply 2 g topically 4 (four) times daily.  Patient taking differently: Apply 2 g topically as needed.   Cherylene Homer HERO, NP  Active Self, Pharmacy Records  diltiazem  (CARDIZEM  CD) 300 MG 24 hr capsule [id number]  Take 1 capsule (300 mg total) by mouth daily. [name], MD  Active Self, Pharmacy Records  diltiazem  (CARDIZEM ) 60 MG tablet [id number]  Take 1 every 8 hours as needed for fast heart rate and palpitations Suzette Pac, MD  Active Self, Pharmacy Records  dofetilide  (TIKOSYN ) 250 MCG capsule [id number]  Take 1 capsule (250 mcg total) by mouth 2 (two) times daily. Fenton, Clint R, PA  Active   DULoxetine  (CYMBALTA ) 60 MG capsule [id number]  Take 1 capsule (60 mg total) by mouth daily. Severa Rock HERO, FNP  Active Self, Pharmacy Records  ELIQUIS  5 MG TABS tablet [id number]  TAKE ONE TABLET BY MOUTH TWICE DAILY [name], MD  Active Self, Pharmacy Records  GARLIC  PO [id number]  Take 1 tablet by mouth daily. [provider]  Active Self, Pharmacy Records  HYDROcodone -acetaminophen  (NORCO) 7.5-325 MG tablet [id number]  Take 1 tablet by mouth every 6 (six) hours as needed for moderate pain (pain score 4-6). Severa Rock HERO, FNP  Active Self, Pharmacy Records  levothyroxine  (SYNTHROID ) 25 MCG tablet [id number]  Take 1 tablet (25 mcg total) by mouth daily before breakfast. [name], NP  Active Self, Pharmacy Records  lidocaine  (HM LIDOCAINE  PATCH) 4 % [id number]  Place 2 patches onto the skin daily.  Patient taking differently: Place 2 patches onto the skin as needed.   Cathlene Marry Lenis, FNP  Active Self, Pharmacy Records  losartan  (COZAAR ) 50 MG tablet [id number]  Take 1.5 tablets (75 mg total)  by mouth daily. [name], MD  Active Self, Pharmacy Records  MAGNESIUM  CARBONATE PO [id number]  Take 1 tablet by mouth at bedtime.  Patient taking differently: Take 400 mg by mouth at bedtime.   [provider]  Active Self, Pharmacy Records  metFORMIN  (GLUCOPHAGE ) 500 MG tablet [id number]  Take 1 tablet (500 mg total) by mouth daily with breakfast. Severa Rock HERO, FNP  Active   metoprolol  succinate (TOPROL -XL) 100 MG 24 hr tablet [id number]  TAKE 1 TABLET BY MOUTH DAILY WITH FOOD [name], MD  Active   nitroGLYCERIN  (NITROSTAT ) 0.4 MG SL tablet [id number]  DISSOLVE 1 TAB UNDER TOUNGE FOR CHEST PAIN. MAY REPEAT EVERY 5 MINUTES FOR 3 DOSES. IF NO RELIEF CALL 911 OR GO TO ER [name], MD  Active Self, Pharmacy Records  rosuvastatin  (CRESTOR ) 20 MG tablet [id number]  TAKE ONE (1) TABLET BY MOUTH EVERY DAY [name], MD  Active Self, Pharmacy Records  Suvorexant  (BELSOMRA ) 5 MG TABS [id number]  Take 1 tablet (5 mg total) by mouth at bedtime as needed.  Patient not taking: Reported on 02/07/2024   Severa Rock HERO, FNP  Active  Self, Pharmacy Records  timolol  (TIMOPTIC ) 0.5 % ophthalmic solution [id number]  Place 1 drop into both eyes daily. [provider]  Active Self, Pharmacy Records  TURMERIC PO [id number]  Take 1 tablet by mouth daily. [provider]  Active Self, Pharmacy Records  VENTOLIN  HFA 108 [phone] Base) MCG/ACT inhaler [id number]  Inhale 1-2 puffs into the lungs every 4 (four) hours as needed for shortness of breath. Pearlean Manus, MD  Active Self, Pharmacy Records  Vitamin D , Cholecalciferol , 10 MCG (400 UNIT) CAPS [id number]  Take 400 Units by mouth daily. [provider]  Active Self, Pharmacy Records            Goals Addressed             This Visit's Progress    VBCI Transitions of Care (TOC) Care Plan       Problems:  Recent Hospitalization for treatment of Atrial Fibrillation Patient requests RN CM skip  telephone outreach week of 01/28/24 due to she is very busy 02/07/24- pt reports she is out of Belsomra , needs a refill as she is hardly able to sleep at night, pt tried 10mg  dosage, states  it did help some but didn't completely help with the insomnia  pt states she may be switched to another type of medication and was told she needs to see primary care provider before that can be done, pt requests refill of Belsomra  and requests 10mg  for the interim until it is decided on other options for insomnia.  Goal:  Over the next 30 days, the patient will not experience hospital readmission  Interventions:  AFIB Interventions:  Counseled on increased risk of stroke due to Afib and benefits of anticoagulation for stroke prevention Reviewed importance of adherence to anticoagulant exactly as prescribed Counseled on bleeding risk associated with taking anticoagulant and importance of self-monitoring for signs/symptoms of bleeding Counseled on importance of regular laboratory monitoring as prescribed Reviewed Atrial Fibrillation action plan Reviewed all upcoming scheduled appointments Reviewed sleep hygiene, night time routine to promote healthy sleep In basket message sent to primary care provider reporting pt requesting refill for Belsomra  (would like 10mg ), medication helps somewhat although does not completely relieve insomnia  Patient Self Care Activities:  Attend all scheduled provider appointments Attend church or other social activities Call pharmacy for medication refills 3-7 days in advance of running out of medications Call provider office for new concerns or questions  Notify RN Care Manager of TOC call rescheduling needs Participate in Transition of Care Program/Attend TOC scheduled calls Perform all self care activities independently  Take medications as prescribed   check pulse (heart) rate once a day make a plan to exercise regularly make a plan to eat healthy keep all lab  appointments take medicine as prescribed wear medical alert identification  Plan:  Telephone follow up appointment with care management team member scheduled for:  02/12/24 @ 1030 am The patient has been provided with contact information for the care management team and has been advised to call with any health related questions or concerns.          Recommendation:   PCP Follow-up  Follow Up Plan:   Telephone follow-up 02/12/24 @ 1030 am  Mliss Creed [facility], BSN RN Care Manager/ Transition of Care [place]/ [facility] [phone]

## 2024-02-07 NOTE — Patient Instructions (Signed)
 Visit Information  Thank you for taking time to visit with me today. Please don't hesitate to contact me if I can be of assistance to you before our next scheduled telephone appointment.  Our next appointment is by telephone on 02/12/24 @ 1030 am  Following is a copy of your care plan:   Goals Addressed             This Visit's Progress    VBCI Transitions of Care (TOC) Care Plan       Problems:  Recent Hospitalization for treatment of Atrial Fibrillation Patient requests RN CM skip telephone outreach week of 01/28/24 due to she is very busy 02/07/24- pt reports she is out of Belsomra , needs a refill as she is hardly able to sleep at night, pt tried 10mg  dosage, states  it did help some but didn't completely help with the insomnia  pt states she may be switched to another type of medication and was told she needs to see primary care provider before that can be done, pt requests refill of Belsomra  and requests 10mg  for the interim until it is decided on other options for insomnia.  Goal:  Over the next 30 days, the patient will not experience hospital readmission  Interventions:  AFIB Interventions:  Counseled on increased risk of stroke due to Afib and benefits of anticoagulation for stroke prevention Reviewed importance of adherence to anticoagulant exactly as prescribed Counseled on bleeding risk associated with taking anticoagulant and importance of self-monitoring for signs/symptoms of bleeding Counseled on importance of regular laboratory monitoring as prescribed Reviewed Atrial Fibrillation action plan Reviewed all upcoming scheduled appointments Reviewed sleep hygiene, night time routine to promote healthy sleep In basket message sent to primary care provider reporting pt requesting refill for Belsomra  (would like 10mg ), medication helps somewhat although does not completely relieve insomnia  Patient Self Care Activities:  Attend all scheduled provider appointments Attend  church or other social activities Call pharmacy for medication refills 3-7 days in advance of running out of medications Call provider office for new concerns or questions  Notify RN Care Manager of TOC call rescheduling needs Participate in Transition of Care Program/Attend TOC scheduled calls Perform all self care activities independently  Take medications as prescribed   check pulse (heart) rate once a day make a plan to exercise regularly make a plan to eat healthy keep all lab appointments take medicine as prescribed wear medical alert identification  Plan:  Telephone follow up appointment with care management team member scheduled for:  02/12/24 @ 1030 am The patient has been provided with contact information for the care management team and has been advised to call with any health related questions or concerns.         Care plan and visit instructions communicated with the patient verbally today. Patient agrees to receive a copy in MyChart. Active MyChart status and patient understanding of how to access instructions and care plan via MyChart confirmed with patient.     Telephone follow up appointment with care management team member scheduled for:  02/12/24 @ 1030 am  Please call the care guide team at [phone] if you need to cancel or reschedule your appointment.   Please call the Suicide and Crisis Lifeline: 988 call the [place]  National Suicide Prevention Lifeline: (579)353-4996 or TTY: 757-668-8646 TTY 847 348 7104) to talk to a trained counselor call 1-800-273-TALK (toll free, 24 hour hotline) go to [facility] [address] [place] [phone]) call the [facility]  Crisis Line: (480)863-0676 call 911 if you are experiencing a Mental Health or Behavioral Health Crisis or need someone to talk to.  Mliss Creed [facility], BSN RN Care Manager/ Transition of Care [place]/ [facility] [phone]

## 2024-02-07 NOTE — Patient Outreach (Signed)
 02/07/2024  Patient ID: [name], female   DOB: 11-18-1940, 83 y.o.   MRN: [id number]  Message received back from primary care provider regarding medication belsomra , pt will have to come into the office to be seen due to this is a controlled substance. RN CM called pt and informed her of the above information, pt verbalizes understanding and states she will call and schedule an appointment.  Mliss Creed [facility], BSN RN Care Manager/ Transition of Care [place]/ [facility] [phone]

## 2024-02-07 NOTE — Telephone Encounter (Unsigned)
 Copied from CRM #[id number]. Topic: General - Call Back - No Documentation >> Feb 07, 2024 10:08 AM Gustabo D wrote: Pt is wanting a call back from her pcp. I gave her the message that was left she declined setting up appt.

## 2024-02-08 ENCOUNTER — Other Ambulatory Visit (HOSPITAL_COMMUNITY)
Admission: RE | Admit: 2024-02-08 | Discharge: 2024-02-08 | Disposition: A | Discharge status: Home / Self Care | Source: Ambulatory Visit | Attending: Nurse Practitioner | Admitting: Nurse Practitioner

## 2024-02-08 DIAGNOSIS — E042 Nontoxic multinodular goiter: Secondary | ICD-10-CM | POA: Diagnosis present

## 2024-02-08 DIAGNOSIS — E032 Hypothyroidism due to medicaments and other exogenous substances: Secondary | ICD-10-CM | POA: Diagnosis present

## 2024-02-08 LAB — TSH: TSH: 0.857 u[IU]/mL (ref 0.350–4.500)

## 2024-02-08 LAB — T4, FREE: Free T4: 1.54 ng/dL (ref 0.80–2.00)

## 2024-02-08 NOTE — Progress Notes (Signed)
 Remote Loop Recorder Transmission

## 2024-02-11 ENCOUNTER — Ambulatory Visit: Admitting: Nurse Practitioner

## 2024-02-11 ENCOUNTER — Encounter: Payer: Self-pay | Admitting: Nurse Practitioner

## 2024-02-11 ENCOUNTER — Encounter

## 2024-02-11 VITALS — BP 120/62 | HR 60 | Ht 64.0 in | Wt 141.2 lb

## 2024-02-11 DIAGNOSIS — E032 Hypothyroidism due to medicaments and other exogenous substances: Secondary | ICD-10-CM | POA: Diagnosis not present

## 2024-02-11 DIAGNOSIS — E042 Nontoxic multinodular goiter: Secondary | ICD-10-CM

## 2024-02-11 MED ORDER — LEVOTHYROXINE SODIUM 25 MCG PO TABS: 25.0000 ug | ORAL_TABLET | Freq: Every day | ORAL | 3 refills | Status: AC

## 2024-02-11 NOTE — Progress Notes (Signed)
 "                                                  02/11/2024, 2:41 PM                          Endocrinology Follow Up Visit   Subjective:    Patient ID: [name], female    DOB: Sep 04, 1940, PCP [name], FNP   Past Medical History:  Diagnosis Date   Anxiety    Arthritis    Bulging lumbar disc    Chronic lower back pain    Coronary atherosclerosis of native coronary artery    DES LAD 02/2008, DES LAD 02/2015, DES ramus and DES x2 mid RCA 02/2020   Depression    Dyslipidemia    Essential hypertension    Facial numbness    Frequent headaches    GERD (gastroesophageal reflux disease)    Glaucoma    Heart attack (HCC) 02/2020   Insomnia    Paroxysmal atrial fibrillation (HCC)    Diagnosed October 2019   PSVT (paroxysmal supraventricular tachycardia)    Refusal of blood transfusions as patient is Jehovah's Witness    Thyroid  disease    Type 2 diabetes mellitus (HCC)    Borderline   Vitamin D  deficiency    Past Surgical History:  Procedure Laterality Date   ATRIAL FIBRILLATION ABLATION N/A 02/16/2021   Procedure: ATRIAL FIBRILLATION ABLATION;  Surgeon: Kelsie Agent, MD;  Location: [facility] INVASIVE CV LAB;  Service: Cardiovascular;  Laterality: N/A;   BIOPSY  09/21/2021   Procedure: BIOPSY;  Surgeon: Shaaron Lamar CHRISTELLA, MD;  Location: [facility];  Service: Endoscopy;;  gastric   CARDIAC CATHETERIZATION N/A 02/23/2015   Procedure: Left Heart Cath and Coronary Angiography;  Surgeon: Ozell Fell, MD;  Location: [facility] INVASIVE CV LAB;  Service: Cardiovascular;  Laterality: N/A;   CARDIAC CATHETERIZATION N/A 02/23/2015   Procedure: Coronary Stent Intervention;  Surgeon: Ozell Fell, MD;  Location: [facility] INVASIVE CV LAB;  Service: Cardiovascular;  Laterality: N/A;   CARDIAC CATHETERIZATION N/A 01/04/2016   Procedure: Left Heart Cath and Coronary Angiography;  Surgeon: Peter M Jordan, MD;  Location: [facility] INVASIVE CV LAB;  Service: Cardiovascular;  Laterality: N/A;    CARDIOVERSION N/A 01/10/2024   Procedure: CARDIOVERSION;  Surgeon: Francyne Headland, MD;  Location: [facility] INVASIVE CV LAB;  Service: Cardiovascular;  Laterality: N/A;   CATARACT EXTRACTION     CORONARY ANGIOPLASTY  02/23/2015   CORONARY ANGIOPLASTY WITH STENT PLACEMENT  2009   CORONARY STENT INTERVENTION N/A 03/09/2020   Procedure: CORONARY STENT INTERVENTION;  Surgeon: Dann Candyce RAMAN, MD;  Location: [facility] INVASIVE CV LAB;  Service: Cardiovascular;  Laterality: N/A;   DILATION AND CURETTAGE OF UTERUS     ESOPHAGOGASTRODUODENOSCOPY (EGD) WITH PROPOFOL  N/A 09/21/2021   normal esophagus, abnormal gastric mucosa s/p biopsy, normal duodenum. Pathology with H.pylori. Prescribed Prevpac.   implantable loop recorder placement  04/01/2020   Medtronic Reveal [place] model LNQ 22 [phone]) implantable loop recorder    LEFT HEART CATH AND CORONARY ANGIOGRAPHY N/A 03/09/2020   Procedure: LEFT HEART CATH AND CORONARY ANGIOGRAPHY;  Surgeon: Dann Candyce RAMAN, MD;  Location: [facility] INVASIVE CV LAB;  Service: Cardiovascular;  Laterality: N/A;   TUBAL LIGATION     Social History   Socioeconomic History   Marital  status: Widowed    Spouse name: Not on file   Number of children: 8   Years of education: 10th grade   Highest education level: 10th grade  Occupational History   Occupation: RETIRED  Tobacco Use   Smoking status: Never    Passive exposure: Never   Smokeless tobacco: Never   Tobacco comments:    Never smoke 11/15/21  Vaping Use   Vaping status: Never Used  Substance and Sexual Activity   Alcohol use: Not Currently    Alcohol/week: 1.0 standard drink of alcohol    Types: 1 Glasses of wine per week    Comment: RARE OCCASION   Drug use: No   Sexual activity: Not Currently  Other Topics Concern   Not on file  Social History Narrative   Lives at home alone.   Right-handed.   No caffeine use.   Daughter lives next door and helps her out   Social Drivers of Health   Tobacco Use: Low  Risk (02/11/2024)   Patient History    Smoking Tobacco Use: Never    Smokeless Tobacco Use: Never    Passive Exposure: Never  Financial Resource Strain: Low Risk (03/21/2023)   Received from [facility]   Overall Financial Resource Strain (CARDIA)    Difficulty of Paying Living Expenses: Not very hard  Food Insecurity: No Food Insecurity (01/14/2024)   Epic    Worried About Programme Researcher, Broadcasting/film/video in the Last Year: Never true    Ran Out of Food in the Last Year: Never true  Transportation Needs: No Transportation Needs (01/14/2024)   Epic    Lack of Transportation (Medical): No    Lack of Transportation (Non-Medical): No  Physical Activity: Inactive (03/05/2023)   Exercise Vital Sign    Days of Exercise per Week: 0 days    Minutes of Exercise per Session: 0 min  Stress: No Stress Concern Present (03/05/2023)   [employer] of Occupational Health - Occupational Stress Questionnaire    Feeling of Stress : Not at all  Social Connections: Moderately Isolated (01/09/2024)   Social Connection and Isolation Panel    Frequency of Communication with Friends and Family: More than three times a week    Frequency of Social Gatherings with Friends and Family: Three times a week    Attends Religious Services: More than 4 times per year    Active Member of Clubs or Organizations: No    Attends Banker Meetings: Never    Marital Status: Widowed  Depression (PHQ2-9): Low Risk (01/14/2024)   Depression (PHQ2-9)    PHQ-2 Score: 0  Recent Concern: Depression (PHQ2-9) - High Risk (12/28/2023)   Depression (PHQ2-9)    PHQ-2 Score: 12  Alcohol Screen: Low Risk (03/05/2023)   Alcohol Screen    Last Alcohol Screening Score (AUDIT): 0  Housing: Unknown (01/14/2024)   Epic    Unable to Pay for Housing in the Last Year: No    Number of Times Moved in the Last Year: Not on file    Homeless in the Last Year: No  Utilities: Not At Risk (01/14/2024)   Epic    Threatened with loss of  utilities: No  Health Literacy: Adequate Health Literacy (03/05/2023)   B1300 Health Literacy    Frequency of need for help with medical instructions: Never   Family History  Problem Relation Age of Onset   Other Mother        natural causes   Heart disease Mother  Cancer Father        unsure of type   Colon cancer Daughter        2011   Outpatient Encounter Medications as of 02/11/2024  Medication Sig   acetaminophen  (TYLENOL ) 500 MG tablet Take 1,000 mg by mouth every 6 (six) hours as needed for moderate pain. (Patient taking differently: Take 1,000 mg by mouth as needed for moderate pain (pain score 4-6).)   bimatoprost (LUMIGAN) 0.01 % SOLN Place 1 drop into both eyes at bedtime.   Calcium  Carbonate Antacid (TUMS PO) Take 2 tablets by mouth 4 (four) times daily as needed (for acid reflux/indigestion).    Camphor-Menthol -Methyl Sal (SALONPAS ) 3.02-26-08 % PTCH Apply 1 patch topically daily as needed (pain).   cetirizine (ZYRTEC) 10 MG tablet Take 10 mg by mouth daily as needed for allergies.   Cyanocobalamin  (VITAMIN B 12 PO) Take 1 tablet by mouth daily.   diclofenac  Sodium (VOLTAREN ) 1 % GEL Apply 2 g topically 4 (four) times daily. (Patient taking differently: Apply 2 g topically as needed.)   diltiazem  (CARDIZEM  CD) 300 MG 24 hr capsule Take 1 capsule (300 mg total) by mouth daily.   diltiazem  (CARDIZEM ) 60 MG tablet Take 1 every 8 hours as needed for fast heart rate and palpitations   dofetilide  (TIKOSYN ) 250 MCG capsule Take 1 capsule (250 mcg total) by mouth 2 (two) times daily.   DULoxetine  (CYMBALTA ) 60 MG capsule Take 1 capsule (60 mg total) by mouth daily.   ELIQUIS  5 MG TABS tablet TAKE ONE TABLET BY MOUTH TWICE DAILY   GARLIC  PO Take 1 tablet by mouth daily.   HYDROcodone -acetaminophen  (NORCO) 7.5-325 MG tablet Take 1 tablet by mouth every 6 (six) hours as needed for moderate pain (pain score 4-6).   lidocaine  (HM LIDOCAINE  PATCH) 4 % Place 2 patches onto the skin  daily. (Patient taking differently: Place 2 patches onto the skin as needed.)   losartan  (COZAAR ) 50 MG tablet Take 1.5 tablets (75 mg total) by mouth daily.   MAGNESIUM  CARBONATE PO Take 1 tablet by mouth at bedtime. (Patient taking differently: Take 400 mg by mouth at bedtime.)   metFORMIN  (GLUCOPHAGE ) 500 MG tablet Take 1 tablet (500 mg total) by mouth daily with breakfast.   metoprolol  succinate (TOPROL -XL) 100 MG 24 hr tablet TAKE 1 TABLET BY MOUTH DAILY WITH FOOD   nitroGLYCERIN  (NITROSTAT ) 0.4 MG SL tablet DISSOLVE 1 TAB UNDER TOUNGE FOR CHEST PAIN. MAY REPEAT EVERY 5 MINUTES FOR 3 DOSES. IF NO RELIEF CALL 911 OR GO TO ER   rosuvastatin  (CRESTOR ) 20 MG tablet TAKE ONE (1) TABLET BY MOUTH EVERY DAY   timolol  (TIMOPTIC ) 0.5 % ophthalmic solution Place 1 drop into both eyes daily.   TURMERIC PO Take 1 tablet by mouth daily.   VENTOLIN  HFA 108 (90 Base) MCG/ACT inhaler Inhale 1-2 puffs into the lungs every 4 (four) hours as needed for shortness of breath.   Vitamin D , Cholecalciferol , 10 MCG (400 UNIT) CAPS Take 400 Units by mouth daily.   [DISCONTINUED] levothyroxine  (SYNTHROID ) 25 MCG tablet Take 1 tablet (25 mcg total) by mouth daily before breakfast.   Ascorbic Acid  (VITAMIN C ) 500 MG tablet Take 500 mg by mouth daily.     levothyroxine  (SYNTHROID ) 25 MCG tablet Take 1 tablet (25 mcg total) by mouth daily before breakfast.   Suvorexant  (BELSOMRA ) 5 MG TABS Take 1 tablet (5 mg total) by mouth at bedtime as needed. (Patient not taking: Reported on 02/11/2024)   No  facility-administered encounter medications on file as of 02/11/2024.   ALLERGIES: No Known Allergies  VACCINATION STATUS: Immunization History  Administered Date(s) Administered   Fluad Quad(high Dose 65+) 11/20/2019, 12/03/2020, 12/16/2021   Fluad Trivalent(High Dose 65+) 11/30/2022   INFLUENZA, HIGH DOSE SEASONAL PF 12/25/2016, 01/07/2018, 12/28/2023   Influenza Split 12/05/2005, 12/03/2006   Influenza,inj,quad, With  Preservative 12/03/2015   Influenza-Unspecified 01/30/1995, 12/13/2000, 03/16/2003, 01/21/2015, 12/25/2016, 01/07/2018, 11/21/2018   Moderna Covid-19 Fall Seasonal Vaccine 32yrs & older 11/17/2021, 01/10/2023   Moderna Covid-19 Vaccine Bivalent Booster 45yrs & up 01/18/2021   Moderna SARS-COV2 Booster Vaccination 12/31/2019, 07/04/2020   Moderna Sars-Covid-2 Vaccination 04/01/2019, 04/29/2019   PNEUMOCOCCAL CONJUGATE-20 11/30/2022   Pneumococcal Polysaccharide-23 01/30/1995, 12/05/2005, 03/11/2020   Pneumococcal-Unspecified 12/24/2018   Respiratory Syncytial Virus Vaccine,Recomb Aduvanted(Arexvy) 11/17/2021   Tdap 12/24/2018   Zoster Recombinant(Shingrix ) 03/04/2021, 06/24/2021    Thyroid  Problem Presents for follow-up visit. Symptoms include anxiety, diarrhea, fatigue, palpitations (having cardiac ablation soon) and weight loss. Patient reports no cold intolerance, constipation, depressed mood, heat intolerance, tremors or weight gain. The symptoms have been worsening.    [name] is 83 y.o. female who is being engaged in follow-up after she was seen in consultation for hypothyroidism.    PCP:  [name], FNP.   Patient is assisted by her daughter during visit.   Her labs confirm primary hypothyroidism. Of note, she was treated with amiodarone  for almost a year due to cardiac dysrhythmia .  Patient denies any family history of thyroid  dysfunction, except for her sister who had nodules on her thyroid  requiring biopsy which were benign.  She also had suspicious nodules on her thyroid  requiring biopsy.  All came back benign, even the one that needed to be sent to Afirma for confirmation.  She reports weight gain, fatigue, cold intolerance, constipation.   Review of systems  Constitutional: + stable body weight,  current Body mass index is 24.24 kg/m. , + fatigue-improved, no subjective hyperthermia, no subjective hypothermia, + decreased appetite (nothing tastes good to her  anymore) Eyes: no blurry vision, no xerophthalmia ENT: no sore throat, no nodules palpated in throat, no dysphagia/odynophagia, no hoarseness Cardiovascular: no chest pain, no shortness of breath, no palpitations, no leg swelling Respiratory: no cough, no shortness of breath Gastrointestinal: no nausea/vomiting, no diarrhea Musculoskeletal: no muscle/joint aches Skin: no rashes, no hyperemia Neurological: no tremors, no numbness, no tingling, no dizziness Psychiatric: no depression, no anxiety  Objective:    BP 120/62 (BP Location: Right Arm, Patient Position: Sitting, Cuff Size: Large)   Pulse 60   Ht 5' 4 (1.626 m)   Wt 141 lb 3.2 oz (64 kg)   LMP  (LMP Unknown)   BMI 24.24 kg/m   Wt Readings from Last 3 Encounters:  02/11/24 141 lb 3.2 oz (64 kg)  01/21/24 139 lb (63 kg)  01/16/24 138 lb 9.6 oz (62.9 kg)    BP Readings from Last 3 Encounters:  02/11/24 120/62  01/21/24 116/60  01/16/24 (!) 150/78    Physical Exam- Limited  Constitutional:  Body mass index is 24.24 kg/m. , not in acute distress, normal state of mind Eyes:  EOMI, no exophthalmos Musculoskeletal: no gross deformities, strength intact in all four extremities, no gross restriction of joint movements Skin:  no rashes, no hyperemia Neurological: no tremor with outstretched hands    CMP ( most recent) CMP     Component Value Date/Time   NA 139 01/21/2024 1631   K 4.5 01/21/2024 1631  CL 101 01/21/2024 1631   CO2 22 01/21/2024 1631   GLUCOSE 107 (H) 01/21/2024 1631   GLUCOSE 119 (H) 01/11/2024 0427   BUN 14 01/21/2024 1631   CREATININE 0.82 01/21/2024 1631   CALCIUM  9.9 01/21/2024 1631   PROT 6.5 12/11/2023 1357   PROT 6.8 10/16/2023 1134   ALBUMIN 4.3 12/11/2023 1357   ALBUMIN 4.5 10/16/2023 1134   AST 24 12/11/2023 1357   ALT 25 12/11/2023 1357   ALKPHOS 86 12/11/2023 1357   BILITOT 0.3 12/11/2023 1357   BILITOT 0.3 10/16/2023 1134   GFRNONAA >60 01/11/2024 0427   GFRAA >60 10/21/2019  1347     Diabetic Labs (most recent): Lab Results  Component Value Date   HGBA1C 5.9 (H) 01/16/2024   HGBA1C 5.9 (H) 10/16/2023   HGBA1C 5.8 (H) 07/13/2023     Lipid Panel ( most recent) Lipid Panel     Component Value Date/Time   CHOL 109 01/24/2022 1026   TRIG 87 01/24/2022 1026   HDL 41 01/24/2022 1026   CHOLHDL 2.7 01/24/2022 1026   CHOLHDL 2.4 03/09/2020 0209   VLDL 12 03/09/2020 0209   LDLCALC 51 01/24/2022 1026   LABVLDL 17 01/24/2022 1026      Lab Results  Component Value Date   TSH 0.857 02/08/2024   TSH 0.549 10/16/2023   TSH 0.483 08/07/2023   TSH 0.463 03/19/2023   TSH 0.646 12/15/2022   TSH 0.462 08/10/2022   TSH 0.529 07/26/2021   TSH 0.232 (L) 02/07/2021   TSH 1.995 01/10/2021   TSH 0.552 08/27/2020   FREET4 1.54 02/08/2024   FREET4 1.00 08/07/2023   FREET4 1.09 03/19/2023   FREET4 1.35 (H) 12/15/2022   FREET4 1.18 (H) 08/10/2022   FREET4 1.06 07/26/2021   FREET4 1.37 (H) 02/07/2021   FREET4 1.26 (H) 08/06/2020   FREET4 0.95 05/05/2020   FREET4 0.65 03/03/2020     Thyroid  US  on 04/29/20 CLINICAL DATA:  Hyperthyroidism   EXAM: THYROID  ULTRASOUND   TECHNIQUE: Ultrasound examination of the thyroid  gland and adjacent soft tissues was performed.   COMPARISON:  None.   FINDINGS: Parenchymal Echotexture: Mildly heterogeneous   Isthmus: 0.7 cm   Right lobe: 6.2 x 2.6 x 2.2 cm   Left lobe: 5.0 x 2.1 x 2.2 cm   _________________________________________________________   Estimated total number of nodules >/= 1 cm: 7   Number of spongiform nodules >/=  2 cm not described below (TR1): 0   Number of mixed cystic and solid nodules >/= 1.5 cm not described below (TR2): 0   _________________________________________________________   Nodule # 1:   Location: Right; superior   Maximum size: 1.3 cm; Other 2 dimensions: 1.3 x 1.0 cm   Composition: solid/almost completely solid (2)   Echogenicity: hypoechoic (2)   Shape:  taller-than-wide (3)   Margins: smooth (0)   Echogenic foci: none (0)   ACR TI-RADS total points: 7.   ACR TI-RADS risk category: TR5 (>/= 7 points).   ACR TI-RADS recommendations:   **Given size (>/= 1.0 cm) and appearance, fine needle aspiration of this highly suspicious nodule should be considered based on TI-RADS criteria.   _________________________________________________________   Nodule # 2:   Location: Right; superior   Maximum size: 2.2 cm; Other 2 dimensions: 1.7 x 1.7 cm   Composition: solid/almost completely solid (2)   Echogenicity: isoechoic (1)   Shape: taller-than-wide (3)   Margins: smooth (0)   Echogenic foci: none (0)   ACR TI-RADS total points: 4.  ACR TI-RADS risk category: TR4 (4-6 points).   ACR TI-RADS recommendations:   **Given size (>/= 1.5 cm) and appearance, fine needle aspiration of this moderately suspicious nodule should be considered based on TI-RADS criteria.   _________________________________________________________   Nodule # 3:   Location: Right; Mid   Maximum size: 2.3 cm; Other 2 dimensions: 1.7 x 2.2 cm   Composition: solid/almost completely solid (2)   Echogenicity: hypoechoic (2)   Shape: not taller-than-wide (0)   Margins: smooth (0)   Echogenic foci: none (0)   ACR TI-RADS total points: 4.   ACR TI-RADS risk category: TR4 (4-6 points).   ACR TI-RADS recommendations:   **Given size (>/= 1.5 cm) and appearance, fine needle aspiration of this moderately suspicious nodule should be considered based on TI-RADS criteria.   _________________________________________________________   Nodule # 4:   Location: Right; Inferior   Maximum size: 1.6 cm; Other 2 dimensions: 1.0 x 1.2 cm   Composition: mixed cystic and solid (1)   Echogenicity: isoechoic (1)   Shape: not taller-than-wide (0)   Margins: smooth (0)   Echogenic foci: none (0)   ACR TI-RADS total points: 2.   ACR TI-RADS risk  category: TR2 (2 points).   ACR TI-RADS recommendations:   This nodule does NOT meet TI-RADS criteria for biopsy or dedicated follow-up.   _________________________________________________________   Nodule # 5:   Location: Left; superior   Maximum size: 1.8 cm; Other 2 dimensions: 1.3 x 1.8 cm   Composition: solid/almost completely solid (2)   Echogenicity: isoechoic (1)   Shape: not taller-than-wide (0)   Margins: smooth (0)   Echogenic foci: none (0)   ACR TI-RADS total points: 3.   ACR TI-RADS risk category: TR3 (3 points).   ACR TI-RADS recommendations:   *Given size (>/= 1.5 - 2.4 cm) and appearance, a follow-up ultrasound in 1 year should be considered based on TI-RADS criteria.   _________________________________________________________   Nodule # 6:   Location: Left; mid   Maximum size: 1.9 cm; Other 2 dimensions: 1.7 x 1.8 cm   Composition: solid/almost completely solid (2)   Echogenicity: isoechoic (1)   Shape: taller-than-wide (3)   Margins: ill-defined (0)   Echogenic foci: none (0)   ACR TI-RADS total points: 6.   ACR TI-RADS risk category: TR4 (4-6 points).   ACR TI-RADS recommendations:   **Given size (>/= 1.5 cm) and appearance, fine needle aspiration of this moderately suspicious nodule should be considered based on TI-RADS criteria.   _________________________________________________________   Nodule # 7: 1.1 cm cystic nodule in the inferior left thyroid  lobe does not meet criteria for FNA or imaging follow-up.   IMPRESSION: 1. Nodule 1 (TI-RADS 5) located in the superior right thyroid  lobe, measuring 1.3 x 1.3 x 1.0 cm, meets criteria for FNA. 2. Nodule 2 (TI-RADS 4), located in the superior right thyroid  lobe, measuring 2.2 x 1.7 x 1.7 cm, meets criteria for FNA. 3. Nodule 3 (TI-RADS 4) located in the mid right thyroid  lobe, measuring 2.3 x 1.7 x 2.2 cm, meets criteria for FNA. 4. Nodule 5 (TI-RADS 3) located in the  superior left thyroid  lobe, measuring 1.8 x 1.3 x 1.8 cm, meets criteria for imaging follow-up. Next ultrasound should be performed in 1 year. 5. Nodule 6 (TI-RADS 4), located in the mid left thyroid  lobe, measuring 1.9 x 1.7 x 1.8 cm, meets criteria for FNA. 6. Order of suspicion of nodules for consideration for FNA: 1, 3, 2, 6   The above  is in keeping with the ACR TI-RADS recommendations - J Am Coll Radiol 2017;14:587-595.     Electronically Signed   By: Aliene Lloyd M.D.   On: 04/29/2020 16:12 -------------------------------------------------------------------------------------------------------------------------- Cytology - Non PAP  CASE: [id number]  PATIENT: Edda Hogate  Non-Gynecological Cytology Report    FNA Thyroid  Nodule Biopsy  Clinical History: 2.3 cm RML  Specimen Submitted:  A. THYROID , RML, FINE NEEDLE ASPIRATION:    FINAL MICROSCOPIC DIAGNOSIS:  - Atypia of undetermined significance (Bethesda category III)   SPECIMEN ADEQUACY:  Satisfactory for evaluation   DIAGNOSTIC COMMENTS:  This specimen will be sent for Afirma testing.   GROSS:  Received is/are 3 slides in 95% Ethyl alcohol, 3 air dried slides for  diff stain, and 30 ccs of pale pink Cytolyt solution. (CM:cm)  Prepared:  Smears:  6  Concentration Method (Thin Prep):  1  Cell Block:  Cell block attempted, not obtained.  Additional Studies:  Also there was an Afirma collected. For RUL Thyroid   FNA, see [id number].   CYTOLOGY - NON PAP  CASE: [id number]  PATIENT: Lamia Dibella  Non-Gynecological Cytology Report          FNA Thyroid  Nodule Biopsy  Clinical History: 1.3 cm RUL  Specimen Submitted:  A. THYROID , RUL, FINE NEEDLE ASPIRATION:    FINAL MICROSCOPIC DIAGNOSIS:  - Consistent with benign follicular nodule (Bethesda category II)   SPECIMEN ADEQUACY:  Satisfactory for evaluation   GROSS:  Received is/are 4 slides in 95% Ethyl alcohol, 4 air dried slides for  diff  stain, and 30 ccs of pale pink Cytolyt solution. (CM:cm)  Prepared:  Smears:  8  Concentration Method (Thin Prep):  1  Cell Block:  Cell block attempted, not obtained.  Additional Studies:  Also there was an Afirma collected. For RML Thyroid   FNA, see [id number].    Repeat thyroid  US  from 07/26/21 CLINICAL DATA:  Goiter. Multifocal thyroid  ultrasound. Patient previously underwent biopsy of right superior and right mid thyroid  nodules in March of 2022   EXAM: THYROID  ULTRASOUND   TECHNIQUE: Ultrasound examination of the thyroid  gland and adjacent soft tissues was performed.   COMPARISON:  Prior thyroid  ultrasound 04/29/2020   FINDINGS: Parenchymal Echotexture: Markedly heterogenous   Isthmus: 0.5 cm   Right lobe: 4.5 x 2.1 x 2.4 cm   Left lobe: 4.5 x 2.0 x 2.1 cm   _________________________________________________________   Estimated total number of nodules >/= 1 cm: 5   Number of spongiform nodules >/=  2 cm not described below (TR1): 0   Number of mixed cystic and solid nodules >/= 1.5 cm not described below (TR2): 0   _________________________________________________________   The thyroid  gland is diffusely heterogeneous, enlarged and nodular. Many of the areas of nodularity are inseparable from the background thyroid  parenchyma and most likely to represent areas of pseudo nodularity.   Nodule # 1: The previously biopsied nodule/cluster of nodules in the right superior gland demonstrates no significant interval change compared to prior imaging obtained at the time of biopsy.   Nodule # 3: The previously biopsied nodule with central dystrophic calcifications in the right mid to lower gland measures 1.9 x 1.7 x 1.2 cm; slightly smaller than 2.3 by 1.7 x 2.2 cm previously.   Additional measured areas either represent benign-appearing spongiform nodules, or vague regions of pseudo nodularity. No other discrete nodules identified that would warrant further  evaluation.   IMPRESSION: Overall, unchanged appearance of the thyroid  gland which is diffusely enlarged, heterogeneous and lobular with  multiple regions of pseudonodularity.   The previously biopsied nodules in the right upper and right mid gland demonstrate no significant interval change.   No additional foci of definitive nodularity that would warrant biopsy or dedicated imaging surveillance.     Electronically Signed   By: Wilkie Lent M.D.   On: 07/26/2021 16:15   Latest Reference Range & Units 03/19/23 14:03 08/07/23 12:40 08/07/23 12:41 10/16/23 11:34 02/08/24 15:17  TSH 0.350 - 4.500 uIU/mL 0.463  0.483 0.549 0.857  T4,Free(Direct) 0.80 - 2.00 ng/dL 8.90 8.99   8.45  Thyroxine (T4) 4.5 - 12.0 ug/dL    8.8   Free Thyroxine Index 1.2 - 4.9     2.4   T3 Uptake Ratio 24 - 39 %    27     Assessment & Plan:   1. 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  She is advised to continue dose of Levothyroxine  25 mcg po daily before breakfast.   Will recheck TFTs prior to next visit and adjust dose accordingly.   - We discussed about the correct intake of her thyroid  hormone, on empty stomach at fasting, with water, separated by at least 30 minutes from breakfast and other medications,  and separated by more than 4 hours from calcium , iron, multivitamins, acid reflux medications (PPIs). -Patient is made aware of the fact that thyroid  hormone replacement is needed for life, dose to be adjusted by periodic monitoring of thyroid  function tests.   2. Multinodular Goiter -Her thyroid  ultrasound shows multinodular goiter with 4 moderately suspicious nodules that meet criteria for FNA and 1 nodule recommending follow up with ultrasound in 1 year, this was repeated and shows stable MNG, thus no additional surveillance imaging is required.   All biopsies were confirmed to be benign.     - she is  advised to maintain close follow up with Rakes, Rock HERO, FNP for primary care needs.     I spent  23  minutes in the care of the patient today including review of labs from Thyroid  Function, CMP, and other relevant labs ; imaging/biopsy records (current and previous including abstractions from other facilities); face-to-face time discussing  her lab results and symptoms, medications doses, her options of short and long term treatment based on the latest standards of care / guidelines;   and documenting the encounter.  Izetta HERO Ochs  participated in the discussions, expressed understanding, and voiced agreement with the above plans.  All questions were answered to her satisfaction. she is encouraged to contact clinic should she have any questions or concerns prior to her return visit.   Follow up plan: Return in about 6 months (around 08/11/2024) for Thyroid  follow up, Previsit labs.  Benton Rio, [facility] [address] [place], [place] [address] Phone: [phone] Fax: [phone]   02/11/2024, 2:41 PM    "

## 2024-02-11 NOTE — Patient Instructions (Signed)

## 2024-02-12 ENCOUNTER — Other Ambulatory Visit: Admitting: *Deleted

## 2024-02-12 ENCOUNTER — Ambulatory Visit: Admitting: Physician Assistant

## 2024-02-12 NOTE — Patient Instructions (Signed)
 Visit Information  Thank you for taking time to visit with me today. Please don't hesitate to contact me if I can be of assistance to you before our next scheduled telephone appointment.  Our next appointment is no further scheduled appointments.    Following is a copy of your care plan:   Goals Addressed             This Visit's Progress    VBCI Transitions of Care (TOC) Care Plan       Problems:  Recent Hospitalization for treatment of Atrial Fibrillation Patient requests RN CM skip telephone outreach week of 01/28/24 due to she is very busy 02/07/24- pt reports she is out of Belsomra , needs a refill as she is hardly able to sleep at night, pt tried 10mg  dosage, states  it did help some but didn't completely help with the insomnia  pt states she may be switched to another type of medication and was told she needs to see primary care provider before that can be done, pt requests refill of Belsomra  and requests 10mg  for the interim until it is decided on other options for insomnia. 02/12/24- pt reports  I'm doing okay  no new concerns reported  Goal:  Over the next 30 days, the patient will not experience hospital readmission  Interventions:  AFIB Interventions:  Counseled on increased risk of stroke due to Afib and benefits of anticoagulation for stroke prevention Reviewed importance of adherence to anticoagulant exactly as prescribed Counseled on bleeding risk associated with taking anticoagulant and importance of self-monitoring for signs/symptoms of bleeding Counseled on importance of regular laboratory monitoring as prescribed Reinforced Atrial Fibrillation action plan Reviewed all upcoming scheduled appointments Reviewed sleep hygiene, night time routine to promote healthy sleep Reviewed plan of care with pt including case closure   Patient Self Care Activities:  Attend all scheduled provider appointments Attend church or other social activities Call pharmacy for  medication refills 3-7 days in advance of running out of medications Call provider office for new concerns or questions  Notify RN Care Manager of TOC call rescheduling needs Participate in Transition of Care Program/Attend TOC scheduled calls Perform all self care activities independently  Take medications as prescribed   check pulse (heart) rate once a day make a plan to exercise regularly make a plan to eat healthy keep all lab appointments take medicine as prescribed wear medical alert identification  Plan:  No further follow up required: Case closure The patient has been provided with contact information for the care management team and has been advised to call with any health related questions or concerns.         Care plan and visit instructions communicated with the patient verbally today. Patient agrees to receive a copy in MyChart. Active MyChart status and patient understanding of how to access instructions and care plan via MyChart confirmed with patient.     No further follow up required: case closure  Please call the care guide team at [phone] if you need to cancel or reschedule your appointment.   Please call the Suicide and Crisis Lifeline: 988 call the [place]  National Suicide Prevention Lifeline: (587) 042-4091 or TTY: (989)458-4390 TTY (251)626-0942) to talk to a trained counselor call 1-800-273-TALK (toll free, 24 hour hotline) go to [facility] [address] [place] [phone]) call the [facility] Crisis Line: [phone] call 911 if you are experiencing a Mental Health or Behavioral Health Crisis or need someone to talk to.  Mliss Creed [facility], BSN RN Care Manager/ Transition of Care [place]/ [facility] [phone]

## 2024-02-12 NOTE — Patient Outreach (Signed)
 " Transition of Care week 4  Visit Note  02/12/2024  Name: [name] MRN: [id number]          DOB: [birth year]  Situation: Patient enrolled in [facility] 30-day program. Visit completed with patient by telephone.   Background:  Discharge Date and Diagnosis: 01/11/24, Atrial Flutter   Past Medical History:  Diagnosis Date   Anxiety    Arthritis    Bulging lumbar disc    Chronic lower back pain    Coronary atherosclerosis of native coronary artery    DES LAD 02/2008, DES LAD 02/2015, DES ramus and DES x2 mid RCA 02/2020   Depression    Dyslipidemia    Essential hypertension    Facial numbness    Frequent headaches    GERD (gastroesophageal reflux disease)    Glaucoma    Heart attack (HCC) 02/2020   Insomnia    Paroxysmal atrial fibrillation (HCC)    Diagnosed October 2019   PSVT (paroxysmal supraventricular tachycardia)    Refusal of blood transfusions as patient is Jehovah's Witness    Thyroid  disease    Type 2 diabetes mellitus (HCC)    Borderline   Vitamin D  deficiency     Assessment: Patient Reported Symptoms: Cognitive Cognitive Status: No symptoms reported, Alert and oriented to person, place, and time, Able to follow simple commands, Normal speech and language skills      Neurological Neurological Review of Symptoms: No symptoms reported    HEENT HEENT Symptoms Reported: Other: (pt reports chronic dry mouth, food tastes odd, tongue burning, went to ENT and other doctors, tried multiple medications, mouth washes, pt states  nothing works, they have tried) HEENT Management Strategies: Routine screening, Adequate rest HEENT Self-Management Outcome: 2 (bad) HEENT Comment: pt states she will continue following up with provider for this chronic health condition    Cardiovascular Cardiovascular Symptoms Reported: No symptoms reported Does patient have uncontrolled Hypertension?: No Weight: 141 lb (64 kg) Cardiovascular Self-Management Outcome: 4 (good) Cardiovascular  Comment: Reinforced A-fib action plan  Respiratory Respiratory Symptoms Reported: No symptoms reported    Endocrine Endocrine Symptoms Reported: No symptoms reported Is patient diabetic?: Yes Is patient checking blood sugars at home?: No Endocrine Self-Management Outcome: 4 (good)  Gastrointestinal Gastrointestinal Symptoms Reported: No symptoms reported      Genitourinary Genitourinary Symptoms Reported: No symptoms reported    Integumentary Integumentary Symptoms Reported: No symptoms reported    Musculoskeletal Musculoskelatal Symptoms Reviewed: No symptoms reported Additional Musculoskeletal Details: cane/ walker- uses only when leaves home Musculoskeletal Management Strategies: Adequate rest, Routine screening, Medical device Musculoskeletal Self-Management Outcome: 4 (good) Musculoskeletal Comment: reviewed safety precautions      Psychosocial Psychosocial Symptoms Reported: No symptoms reported         Today's Vitals   02/12/24 1043  Weight: 141 lb (64 kg)   Pain Scale: 0-10 Pain Score: 0-No pain  Medications Reviewed Today     Reviewed by Aura Mliss LABOR, RN (Registered Nurse) on 02/12/24 at 1041  Med List Status: <None>   Medication Order Taking? Sig Documenting Provider Last Dose Status Informant  acetaminophen  (TYLENOL ) 500 MG tablet [id number]  Take 1,000 mg by mouth every 6 (six) hours as needed for moderate pain.  Patient taking differently: Take 1,000 mg by mouth as needed for moderate pain (pain score 4-6).   [provider]  Active Self, Pharmacy Records  Ascorbic Acid  (VITAMIN C ) 500 MG tablet [id number]  Take 500 mg by mouth daily.   [provider]  Active Self, Pharmacy Records  bimatoprost (LUMIGAN) 0.01 % SOLN [id number]  Place 1 drop into both eyes at bedtime. [provider]  Active Self, Pharmacy Records  Calcium  Carbonate Antacid (TUMS PO) [id number]  Take 2 tablets by mouth 4 (four) times daily as needed (for acid  reflux/indigestion).  [provider]  Active Self, Pharmacy Records  Camphor-Menthol -Methyl Sal (SALONPAS ) 3.02-26-08 % PTCH [id number]  Apply 1 patch topically daily as needed (pain). [provider]  Active Self, Pharmacy Records  cetirizine (ZYRTEC) 10 MG tablet [id number]  Take 10 mg by mouth daily as needed for allergies. [provider]  Active Self, Pharmacy Records  Cyanocobalamin  (VITAMIN B 12 PO) [id number]  Take 1 tablet by mouth daily. [provider]  Active Self, Pharmacy Records  diclofenac  Sodium (VOLTAREN ) 1 % GEL [id number]  Apply 2 g topically 4 (four) times daily.  Patient taking differently: Apply 2 g topically as needed.   Cherylene Homer HERO, NP  Active Self, Pharmacy Records  diltiazem  (CARDIZEM  CD) 300 MG 24 hr capsule [id number]  Take 1 capsule (300 mg total) by mouth daily. [name], MD  Active Self, Pharmacy Records  diltiazem  (CARDIZEM ) 60 MG tablet [id number]  Take 1 every 8 hours as needed for fast heart rate and palpitations Suzette Pac, MD  Active Self, Pharmacy Records  dofetilide  (TIKOSYN ) 250 MCG capsule [id number]  Take 1 capsule (250 mcg total) by mouth 2 (two) times daily. Fenton, Clint R, [place]  Active   DULoxetine  (CYMBALTA ) 60 MG capsule [id number]  Take 1 capsule (60 mg total) by mouth daily. Severa Rock HERO, FNP  Active Self, Pharmacy Records  ELIQUIS  5 MG TABS tablet [id number]  TAKE ONE TABLET BY MOUTH TWICE DAILY [name], MD  Active Self, Pharmacy Records  GARLIC  PO [id number]  Take 1 tablet by mouth daily. [provider]  Active Self, Pharmacy Records  HYDROcodone -acetaminophen  (NORCO) 7.5-325 MG tablet [id number]  Take 1 tablet by mouth every 6 (six) hours as needed for moderate pain (pain score 4-6). Severa Rock HERO, FNP  Active Self, Pharmacy Records  levothyroxine  (SYNTHROID ) 25 MCG tablet [id number]  Take 1 tablet (25 mcg total) by mouth daily before breakfast. [name], NP  Active    lidocaine  (HM LIDOCAINE  PATCH) 4 % [id number]  Place 2 patches onto the skin daily.  Patient taking differently: Place 2 patches onto the skin as needed.   Cathlene Marry Lenis, FNP  Active Self, Pharmacy Records  losartan  (COZAAR ) 50 MG tablet [id number]  Take 1.5 tablets (75 mg total) by mouth daily. [name], MD  Active Self, Pharmacy Records  MAGNESIUM  CARBONATE PO [id number]  Take 1 tablet by mouth at bedtime.  Patient taking differently: Take 400 mg by mouth at bedtime.   [provider]  Active Self, Pharmacy Records  metFORMIN  (GLUCOPHAGE ) 500 MG tablet [id number]  Take 1 tablet (500 mg total) by mouth daily with breakfast. Severa Rock HERO, FNP  Active   metoprolol  succinate (TOPROL -XL) 100 MG 24 hr tablet [id number]  TAKE 1 TABLET BY MOUTH DAILY WITH FOOD [name], MD  Active   nitroGLYCERIN  (NITROSTAT ) 0.4 MG SL tablet [id number]  DISSOLVE 1 TAB UNDER TOUNGE FOR CHEST PAIN. MAY REPEAT EVERY 5 MINUTES FOR 3 DOSES. IF NO RELIEF CALL 911 OR GO TO ER [name], MD  Active Self, Pharmacy Records  rosuvastatin  (CRESTOR ) 20 MG tablet [id number]  TAKE ONE (1) TABLET BY MOUTH EVERY DAY [name], MD  Active Self, Pharmacy Records  Suvorexant  (BELSOMRA ) 5 MG TABS [id number]  Take 1 tablet (5 mg total) by mouth at bedtime as needed.  Patient not taking: Reported on 02/11/2024   Severa Rock HERO, FNP  Active Self, Pharmacy Records  timolol  (TIMOPTIC ) 0.5 % ophthalmic solution [id number]  Place 1 drop into both eyes daily. [provider]  Active Self, Pharmacy Records  TURMERIC PO [id number]  Take 1 tablet by mouth daily. [provider]  Active Self, Pharmacy Records  VENTOLIN  HFA 108 [phone] Base) MCG/ACT inhaler [id number]  Inhale 1-2 puffs into the lungs every 4 (four) hours as needed for shortness of breath. Pearlean Manus, MD  Active Self, Pharmacy Records  Vitamin D , Cholecalciferol , 10 MCG (400 UNIT) CAPS [id number]  Take 400 Units by mouth  daily. [provider]  Active Self, Pharmacy Records            Goals Addressed             This Visit's Progress    VBCI Transitions of Care (TOC) Care Plan       Problems:  Recent Hospitalization for treatment of Atrial Fibrillation Patient requests RN CM skip telephone outreach week of 01/28/24 due to she is very busy 02/07/24- pt reports she is out of Belsomra , needs a refill as she is hardly able to sleep at night, pt tried 10mg  dosage, states  it did help some but didn't completely help with the insomnia  pt states she may be switched to another type of medication and was told she needs to see primary care provider before that can be done, pt requests refill of Belsomra  and requests 10mg  for the interim until it is decided on other options for insomnia. 02/12/24- pt reports  I'm doing okay  no new concerns reported  Goal:  Over the next 30 days, the patient will not experience hospital readmission  Interventions:  AFIB Interventions:  Counseled on increased risk of stroke due to Afib and benefits of anticoagulation for stroke prevention Reviewed importance of adherence to anticoagulant exactly as prescribed Counseled on bleeding risk associated with taking anticoagulant and importance of self-monitoring for signs/symptoms of bleeding Counseled on importance of regular laboratory monitoring as prescribed Reinforced Atrial Fibrillation action plan Reviewed all upcoming scheduled appointments Reviewed sleep hygiene, night time routine to promote healthy sleep Reviewed plan of care with pt including case closure   Patient Self Care Activities:  Attend all scheduled provider appointments Attend church or other social activities Call pharmacy for medication refills 3-7 days in advance of running out of medications Call provider office for new concerns or questions  Notify RN Care Manager of TOC call rescheduling needs Participate in Transition of Care  Program/Attend TOC scheduled calls Perform all self care activities independently  Take medications as prescribed   check pulse (heart) rate once a day make a plan to exercise regularly make a plan to eat healthy keep all lab appointments take medicine as prescribed wear medical alert identification  Plan:  No further follow up required: Case closure The patient has been provided with contact information for the care management team and has been advised to call with any health related questions or concerns.          Recommendation:   PCP Follow-up  Follow Up Plan:   Closing From:  Transitions of Care Program  Mliss Creed [facility], BSN RN Care Manager/ Transition of Care [place]/ [facility] [phone]     "

## 2024-02-18 ENCOUNTER — Telehealth: Payer: Self-pay | Admitting: Family Medicine

## 2024-02-18 ENCOUNTER — Ambulatory Visit: Payer: Self-pay | Admitting: Cardiovascular Disease

## 2024-02-18 NOTE — Telephone Encounter (Unsigned)
 Copied from CRM [id number]. Topic: Appointments - Scheduling Inquiry for Clinic >> Feb 18, 2024 12:09 PM Miquel SAILOR wrote: Reason for CRM: PT needs call back on clarification on why she needs the 03/05/24 visit and why medication are not being filled. [phone]

## 2024-02-18 NOTE — Telephone Encounter (Signed)
 Patient scheduled for medication refill. LS

## 2024-02-20 ENCOUNTER — Encounter: Payer: Self-pay | Admitting: Family Medicine

## 2024-02-20 ENCOUNTER — Ambulatory Visit: Admitting: Family Medicine

## 2024-02-20 ENCOUNTER — Other Ambulatory Visit (HOSPITAL_COMMUNITY): Payer: Self-pay

## 2024-02-20 DIAGNOSIS — F5101 Primary insomnia: Secondary | ICD-10-CM | POA: Diagnosis not present

## 2024-02-20 MED ORDER — BELSOMRA 15 MG PO TABS: 15.0000 mg | ORAL_TABLET | Freq: Every evening | ORAL | 0 refills | Status: AC | PRN

## 2024-02-20 MED ORDER — BELSOMRA 5 MG PO TABS: 15.0000 mg | ORAL_TABLET | Freq: Every evening | ORAL | 0 refills | Status: DC | PRN

## 2024-02-20 NOTE — Progress Notes (Signed)
 "    Subjective:  Patient ID: [name], female    DOB: 05-06-1940, 83 y.o.   MRN: [id number]  Patient Care Team: [name], FNP as PCP - General (Family Medicine) [name], MD as PCP - Cardiology (Cardiology) Mealor, Eulas BRAVO, MD as PCP - Electrophysiology (Cardiology) [name], MD as Consulting Physician (Otolaryngology) [name], RPH-CPP (Pharmacist) [name], NP as Nurse Practitioner (Endocrinology) [facility] [name], RN as [facility] Care Management (General Practice) [name], RN   Chief Complaint:  Insomnia   HPI: [name] is a 83 y.o. female presenting on 02/20/2024 for Insomnia    [name] is an 83 year old female who presents with difficulty sleeping despite medication use.  She experiences difficulty both falling asleep and staying asleep despite taking Belsomra . She has tried taking two tablets as advised, but this did not improve her sleep. She currently takes Belsomra  approximately an hour before going to bed, but it has not been effective in helping her fall asleep or stay asleep.  No caffeine consumption in the evening; she drinks coffee in the morning and a soda later in the day, but no tea or other caffeinated beverages.  Her current medications include Tikosyn , Cardizem , and bisoprolol, which have not been altered since her hospital stay. She was started on Tikosyn  during her hospitalization. She follows up with cardiology and had a recent device interrogation two days ago.          Relevant past medical, surgical, family, and social history reviewed and updated as indicated.  Allergies and medications reviewed and updated. Data reviewed: Chart in Epic.   Past Medical History:  Diagnosis Date   Anxiety    Arthritis    Bulging lumbar disc    Chronic lower back pain    Coronary atherosclerosis of native coronary artery    DES LAD 02/2008, DES LAD 02/2015, DES ramus and DES  x2 mid RCA 02/2020   Depression    Dyslipidemia    Essential hypertension    Facial numbness    Frequent headaches    GERD (gastroesophageal reflux disease)    Glaucoma    Heart attack (HCC) 02/2020   Insomnia    Paroxysmal atrial fibrillation (HCC)    Diagnosed October 2019   PSVT (paroxysmal supraventricular tachycardia)    Refusal of blood transfusions as patient is Jehovah's Witness    Thyroid  disease    Type 2 diabetes mellitus (HCC)    Borderline   Vitamin D  deficiency     Past Surgical History:  Procedure Laterality Date   ATRIAL FIBRILLATION ABLATION N/A 02/16/2021   Procedure: ATRIAL FIBRILLATION ABLATION;  Surgeon: Kelsie Agent, MD;  Location: [facility] INVASIVE CV LAB;  Service: Cardiovascular;  Laterality: N/A;   BIOPSY  09/21/2021   Procedure: BIOPSY;  Surgeon: Shaaron Lamar CHRISTELLA, MD;  Location: [facility] ENDO SUITE;  Service: Endoscopy;;  gastric   CARDIAC CATHETERIZATION N/A 02/23/2015   Procedure: Left Heart Cath and Coronary Angiography;  Surgeon: Ozell Fell, MD;  Location: [facility] INVASIVE CV LAB;  Service: Cardiovascular;  Laterality: N/A;   CARDIAC CATHETERIZATION N/A 02/23/2015   Procedure: Coronary Stent Intervention;  Surgeon: Ozell Fell, MD;  Location: [facility] INVASIVE CV LAB;  Service: Cardiovascular;  Laterality: N/A;   CARDIAC CATHETERIZATION N/A 01/04/2016   Procedure: Left Heart Cath and Coronary Angiography;  Surgeon: Peter M Jordan, MD;  Location: [facility] INVASIVE CV LAB;  Service: Cardiovascular;  Laterality: N/A;  CARDIOVERSION N/A 01/10/2024   Procedure: CARDIOVERSION;  Surgeon: Francyne Headland, MD;  Location: [facility] INVASIVE CV LAB;  Service: Cardiovascular;  Laterality: N/A;   CATARACT EXTRACTION     CORONARY ANGIOPLASTY  02/23/2015   CORONARY ANGIOPLASTY WITH STENT PLACEMENT  2009   CORONARY STENT INTERVENTION N/A 03/09/2020   Procedure: CORONARY STENT INTERVENTION;  Surgeon: Dann Candyce RAMAN, MD;  Location: [facility] INVASIVE CV LAB;  Service: Cardiovascular;   Laterality: N/A;   DILATION AND CURETTAGE OF UTERUS     ESOPHAGOGASTRODUODENOSCOPY (EGD) WITH PROPOFOL  N/A 09/21/2021   normal esophagus, abnormal gastric mucosa s/p biopsy, normal duodenum. Pathology with H.pylori. Prescribed Prevpac.   implantable loop recorder placement  04/01/2020   Medtronic Reveal Vernal model LNQ [id number]) implantable loop recorder    LEFT HEART CATH AND CORONARY ANGIOGRAPHY N/A 03/09/2020   Procedure: LEFT HEART CATH AND CORONARY ANGIOGRAPHY;  Surgeon: Dann Candyce RAMAN, MD;  Location: [facility] INVASIVE CV LAB;  Service: Cardiovascular;  Laterality: N/A;   TUBAL LIGATION      Social History   Socioeconomic History   Marital status: Widowed    Spouse name: Not on file   Number of children: 8   Years of education: 10th grade   Highest education level: 10th grade  Occupational History   Occupation: RETIRED  Tobacco Use   Smoking status: Never    Passive exposure: Never   Smokeless tobacco: Never   Tobacco comments:    Never smoke 11/15/21  Vaping Use   Vaping status: Never Used  Substance and Sexual Activity   Alcohol use: Not Currently    Alcohol/week: 1.0 standard drink of alcohol    Types: 1 Glasses of wine per week    Comment: RARE OCCASION   Drug use: No   Sexual activity: Not Currently  Other Topics Concern   Not on file  Social History Narrative   Lives at home alone.   Right-handed.   No caffeine use.   Daughter lives next door and helps her out   Social Drivers of Health   Tobacco Use: Low Risk (02/20/2024)   Patient History    Smoking Tobacco Use: Never    Smokeless Tobacco Use: Never    Passive Exposure: Never  Financial Resource Strain: Low Risk (03/21/2023)   Received from [facility]   Overall Financial Resource Strain (CARDIA)    Difficulty of Paying Living Expenses: Not very hard  Food Insecurity: No Food Insecurity (01/14/2024)   Epic    Worried About Programme Researcher, Broadcasting/film/video in the Last Year: Never true    Ran Out of Food  in the Last Year: Never true  Transportation Needs: No Transportation Needs (01/14/2024)   Epic    Lack of Transportation (Medical): No    Lack of Transportation (Non-Medical): No  Physical Activity: Inactive (03/05/2023)   Exercise Vital Sign    Days of Exercise per Week: 0 days    Minutes of Exercise per Session: 0 min  Stress: No Stress Concern Present (03/05/2023)   [employer] of Occupational Health - Occupational Stress Questionnaire    Feeling of Stress : Not at all  Social Connections: Moderately Isolated (01/09/2024)   Social Connection and Isolation Panel    Frequency of Communication with Friends and Family: More than three times a week    Frequency of Social Gatherings with Friends and Family: Three times a week    Attends Religious Services: More than 4 times per year    Active Member of Clubs or  Organizations: No    Attends Banker Meetings: Never    Marital Status: Widowed  Intimate Partner Violence: Not At Risk (01/14/2024)   Epic    Fear of Current or Ex-Partner: No    Emotionally Abused: No    Physically Abused: No    Sexually Abused: No  Depression (PHQ2-9): Low Risk (01/14/2024)   Depression (PHQ2-9)    PHQ-2 Score: 0  Recent Concern: Depression (PHQ2-9) - High Risk (12/28/2023)   Depression (PHQ2-9)    PHQ-2 Score: 12  Alcohol Screen: Low Risk (03/05/2023)   Alcohol Screen    Last Alcohol Screening Score (AUDIT): 0  Housing: Unknown (01/14/2024)   Epic    Unable to Pay for Housing in the Last Year: No    Number of Times Moved in the Last Year: Not on file    Homeless in the Last Year: No  Utilities: Not At Risk (01/14/2024)   Epic    Threatened with loss of utilities: No  Health Literacy: Adequate Health Literacy (03/05/2023)   B1300 Health Literacy    Frequency of need for help with medical instructions: Never    Outpatient Encounter Medications as of 02/20/2024  Medication Sig   acetaminophen  (TYLENOL ) 500 MG tablet Take 1,000 mg  by mouth every 6 (six) hours as needed for moderate pain. (Patient taking differently: Take 1,000 mg by mouth as needed for moderate pain (pain score 4-6).)   Ascorbic Acid  (VITAMIN C ) 500 MG tablet Take 500 mg by mouth daily.     bimatoprost (LUMIGAN) 0.01 % SOLN Place 1 drop into both eyes at bedtime.   Calcium  Carbonate Antacid (TUMS PO) Take 2 tablets by mouth 4 (four) times daily as needed (for acid reflux/indigestion).    Camphor-Menthol -Methyl Sal (SALONPAS ) 3.02-26-08 % PTCH Apply 1 patch topically daily as needed (pain).   cetirizine (ZYRTEC) 10 MG tablet Take 10 mg by mouth daily as needed for allergies.   Cyanocobalamin  (VITAMIN B 12 PO) Take 1 tablet by mouth daily.   diclofenac  Sodium (VOLTAREN ) 1 % GEL Apply 2 g topically 4 (four) times daily. (Patient taking differently: Apply 2 g topically as needed.)   diltiazem  (CARDIZEM  CD) 300 MG 24 hr capsule Take 1 capsule (300 mg total) by mouth daily.   diltiazem  (CARDIZEM ) 60 MG tablet Take 1 every 8 hours as needed for fast heart rate and palpitations   dofetilide  (TIKOSYN ) 250 MCG capsule Take 1 capsule (250 mcg total) by mouth 2 (two) times daily.   DULoxetine  (CYMBALTA ) 60 MG capsule Take 1 capsule (60 mg total) by mouth daily.   ELIQUIS  5 MG TABS tablet TAKE ONE TABLET BY MOUTH TWICE DAILY   GARLIC  PO Take 1 tablet by mouth daily.   HYDROcodone -acetaminophen  (NORCO) 7.5-325 MG tablet Take 1 tablet by mouth every 6 (six) hours as needed for moderate pain (pain score 4-6).   levothyroxine  (SYNTHROID ) 25 MCG tablet Take 1 tablet (25 mcg total) by mouth daily before breakfast.   lidocaine  (HM LIDOCAINE  PATCH) 4 % Place 2 patches onto the skin daily. (Patient taking differently: Place 2 patches onto the skin as needed.)   losartan  (COZAAR ) 50 MG tablet Take 1.5 tablets (75 mg total) by mouth daily.   MAGNESIUM  CARBONATE PO Take 1 tablet by mouth at bedtime. (Patient taking differently: Take 400 mg by mouth at bedtime.)   metFORMIN   (GLUCOPHAGE ) 500 MG tablet Take 1 tablet (500 mg total) by mouth daily with breakfast.   metoprolol  succinate (TOPROL -XL) 100 MG 24  hr tablet TAKE 1 TABLET BY MOUTH DAILY WITH FOOD   nitroGLYCERIN  (NITROSTAT ) 0.4 MG SL tablet DISSOLVE 1 TAB UNDER TOUNGE FOR CHEST PAIN. MAY REPEAT EVERY 5 MINUTES FOR 3 DOSES. IF NO RELIEF CALL 911 OR GO TO ER   rosuvastatin  (CRESTOR ) 20 MG tablet TAKE ONE (1) TABLET BY MOUTH EVERY DAY   timolol  (TIMOPTIC ) 0.5 % ophthalmic solution Place 1 drop into both eyes daily.   TURMERIC PO Take 1 tablet by mouth daily.   VENTOLIN  HFA 108 (90 Base) MCG/ACT inhaler Inhale 1-2 puffs into the lungs every 4 (four) hours as needed for shortness of breath.   Vitamin D , Cholecalciferol , 10 MCG (400 UNIT) CAPS Take 400 Units by mouth daily.   [DISCONTINUED] Suvorexant  (BELSOMRA ) 5 MG TABS Take 1 tablet (5 mg total) by mouth at bedtime as needed.   Suvorexant  (BELSOMRA ) 5 MG TABS Take 3 tablets (15 mg total) by mouth at bedtime as needed.   No facility-administered encounter medications on file as of 02/20/2024.    Allergies[1]  Pertinent ROS per HPI, otherwise unremarkable      Objective:  BP (!) 142/78   Pulse 68   Temp (!) 96.3 F (35.7 C)   Ht 5' 4 (1.626 m)   Wt 140 lb 9.6 oz (63.8 kg)   LMP  (LMP Unknown)   SpO2 99%   BMI 24.13 kg/m    Wt Readings from Last 3 Encounters:  02/20/24 140 lb 9.6 oz (63.8 kg)  02/12/24 141 lb (64 kg)  02/11/24 141 lb 3.2 oz (64 kg)    Physical Exam Vitals and nursing note reviewed.  Constitutional:      General: She is not in acute distress.    Appearance: Normal appearance. She is normal weight. She is not ill-appearing, toxic-appearing or diaphoretic.  HENT:     Head: Normocephalic and atraumatic.     Mouth/Throat:     Mouth: Mucous membranes are moist.  Eyes:     Pupils: Pupils are equal, round, and reactive to light.  Cardiovascular:     Rate and Rhythm: Normal rate.     Heart sounds: Murmur heard.     Systolic  murmur is present with a grade of 2/6.  Musculoskeletal:     Right lower leg: No edema.     Left lower leg: No edema.  Skin:    General: Skin is warm and dry.     Capillary Refill: Capillary refill takes less than 2 seconds.  Neurological:     General: No focal deficit present.     Mental Status: She is alert and oriented to person, place, and time.  Psychiatric:        Mood and Affect: Mood normal.        Behavior: Behavior normal.        Thought Content: Thought content normal.        Judgment: Judgment normal.       Results for orders placed or performed during the hospital encounter of 02/08/24  TSH   Collection Time: 02/08/24  3:17 PM  Result Value Ref Range   TSH 0.857 0.350 - 4.500 uIU/mL  T4, free   Collection Time: 02/08/24  3:17 PM  Result Value Ref Range   Free T4 1.54 0.80 - 2.00 ng/dL       Pertinent labs & imaging results that were available during my care of the patient were reviewed by me and considered in my medical decision making.  Assessment & Plan:  Toni was seen today for insomnia.  Diagnoses and all orders for this visit:  Primary insomnia -     Suvorexant  (BELSOMRA ) 5 MG TABS; Take 3 tablets (15 mg total) by mouth at bedtime as needed.       Primary insomnia Chronic difficulty with both initiating and maintaining sleep. Current treatment with Belsonra (suvorexant ) 10 mg is ineffective. Limited by Tikosyn  (dofetilide ) interaction, necessitating careful dosing adjustments. No caffeine intake prior to bedtime. Non-pharmacological strategies discussed include winding down activities and environmental modifications. - Prescribed Belsonra 5 mg tablets, instructed to take three tablets (15 mg total) 15-20 minutes before bedtime. - Advised to avoid external stimuli such as phones, TVs, and lights before bedtime. - Recommended reading a book or listening to soothing music or white noise to aid relaxation. - Instructed to monitor effectiveness over one  week and report back.          Continue all other maintenance medications.  Follow up plan: Return if symptoms worsen or fail to improve.   Continue healthy lifestyle choices, including diet (rich in fruits, vegetables, and lean proteins, and low in salt and simple carbohydrates) and exercise (at least 30 minutes of moderate physical activity daily).  Educational handout given for insomnia   The above assessment and management plan was discussed with the patient. The patient verbalized understanding of and has agreed to the management plan. Patient is aware to call the clinic if they develop any new symptoms or if symptoms persist or worsen. Patient is aware when to return to the clinic for a follow-up visit. Patient educated on when it is appropriate to go to the emergency department.   Rosaline Bruns, FNP-C [facility] [phone]     [1] No Known Allergies  "

## 2024-02-20 NOTE — Addendum Note (Signed)
 Addended by: SEVERA ROCK HERO on: 02/20/2024 11:41 AM   Modules accepted: Orders

## 2024-02-25 ENCOUNTER — Other Ambulatory Visit: Payer: Self-pay | Admitting: Cardiology

## 2024-02-25 NOTE — Telephone Encounter (Signed)
 Prescription refill request for Eliquis  received. Indication: AF Last office visit: 01/21/24  C Fenton PA Scr: 0.82 on 01/21/24  Epic Age: 84 Weight: 63kg  Based on above findings Eliquis  5mg  twice daily is the appropriate dose.  Refill approved.

## 2024-02-27 ENCOUNTER — Ambulatory Visit (HOSPITAL_COMMUNITY)
Admission: RE | Admit: 2024-02-27 | Discharge: 2024-02-27 | Disposition: A | Discharge status: Home / Self Care | Source: Ambulatory Visit | Attending: Physician Assistant | Admitting: Physician Assistant

## 2024-02-27 VITALS — BP 162/62 | HR 58 | Ht 64.0 in | Wt 143.5 lb

## 2024-02-27 DIAGNOSIS — I4819 Other persistent atrial fibrillation: Secondary | ICD-10-CM | POA: Diagnosis not present

## 2024-02-27 DIAGNOSIS — E1159 Type 2 diabetes mellitus with other circulatory complications: Secondary | ICD-10-CM

## 2024-02-27 DIAGNOSIS — I152 Hypertension secondary to endocrine disorders: Secondary | ICD-10-CM | POA: Diagnosis not present

## 2024-02-27 DIAGNOSIS — Z79899 Other long term (current) drug therapy: Secondary | ICD-10-CM | POA: Diagnosis not present

## 2024-02-27 DIAGNOSIS — Z5181 Encounter for therapeutic drug level monitoring: Secondary | ICD-10-CM

## 2024-02-27 DIAGNOSIS — D6869 Other thrombophilia: Secondary | ICD-10-CM

## 2024-02-27 DIAGNOSIS — I4891 Unspecified atrial fibrillation: Secondary | ICD-10-CM | POA: Diagnosis not present

## 2024-02-27 MED ORDER — APIXABAN 5 MG PO TABS: 5.0000 mg | ORAL_TABLET | Freq: Two times a day (BID) | ORAL | 5 refills | Status: AC

## 2024-02-27 NOTE — Addendum Note (Signed)
 Encounter addended by: Janel Nancy SAUNDERS, RN on: 02/27/2024 4:57 PM  Actions taken: Order list changed

## 2024-02-27 NOTE — Progress Notes (Signed)
 "   Primary Care Physician: Severa Rock HERO, FNP Primary Cardiologist: Dr Debera Primary Electrophysiologist: Dr Nancey  Referring Physician: Dr Debera Pagan [name] is a 84 y.o. female with a history of CAD, HTN, DM, HLD, CHF, atrial flutter, and persistent atrial fibrillation who presents for follow up in the [facility]. The patient was initially diagnosed with atrial fibrillation on 11/2017 after presenting with symptoms of palpitations and chest pain to ER and found to be in afib with RVR. She was discharged with Eliquis  and rate control with plan to cardiovert on f/u but she had converted on her own. She denies snoring or significant alcohol use. Patient was seen at the ER 11/11/18 with symptoms of SOB, chest tightness, and palpitations for 3 days. She was found to be in afib with RVR and underwent successful DCCV. Zio patch prior to this episode showed no afib but did show very frequent short episodes of SVT. She was started on amiodarone  which controlled her afib but unfortunately she developed hypothyroidism and this was discontinued.   Patient is s/p afib ablation with Dr Kelsie on 02/16/21. There were multiple atypical atrial flutter circuits noted. Post ablation, she had an increase in edema in her right leg, going up to her mid thigh. She started Lasix  daily with only minimal improvement. She reports compliance with Eliquis  and Plavix . Venous u/s showed DVT in R common femoral vein. She saw Dr Sheree and recommendation was to continue Eliquis  and use compression stockings with no other intervention.   She was seen at the ED 12/11/23 with symptoms of palpitations. ECG suspicious for atrial flutter at that time. Pro BNP elevated at 2634. Her ILR shows 1.6% afib burden but suspect undersensing based on heart rate trends.   She is s/p dofetilide  loading 11/18-11/21/25 with DCCV on 01/10/24.   Patient returns for follow up for atrial fibrillation and dofetilide   monitoring. She remains in SR and feels well. She does report that her energy continues to improve. No bleeding issues on anticoagulation.   Today, she  denies symptoms of palpitations, chest pain, shortness of breath, orthopnea, PND, lower extremity edema, dizziness, presyncope, syncope, snoring, daytime somnolence, bleeding, or neurologic sequela. The patient is tolerating medications without difficulties and is otherwise without complaint today.    Atrial Fibrillation Risk Factors:  she does not have symptoms or diagnosis of sleep apnea. she does not have a history of rheumatic fever. she does not have a history of alcohol use. The patient does not have a history of early familial atrial fibrillation or other arrhythmias.   Atrial Fibrillation Management history:  Previous antiarrhythmic drugs: amiodarone , dofetilide    Previous cardioversions: 11/11/18, 01/10/24 Previous ablations: 02/16/21 Anticoagulation history: Eliquis    Past Medical History:  Diagnosis Date   Anxiety    Arthritis    Bulging lumbar disc    Chronic lower back pain    Coronary atherosclerosis of native coronary artery    DES LAD 02/2008, DES LAD 02/2015, DES ramus and DES x2 mid RCA 02/2020   Depression    Dyslipidemia    Essential hypertension    Facial numbness    Frequent headaches    GERD (gastroesophageal reflux disease)    Glaucoma    Heart attack (HCC) 02/2020   Insomnia    Paroxysmal atrial fibrillation (HCC)    Diagnosed October 2019   PSVT (paroxysmal supraventricular tachycardia)    Refusal of blood transfusions as patient is Jehovah's Witness    Thyroid   disease    Type 2 diabetes mellitus (HCC)    Borderline   Vitamin D  deficiency      Current Outpatient Medications  Medication Sig Dispense Refill   acetaminophen  (TYLENOL ) 500 MG tablet Take 1,000 mg by mouth every 6 (six) hours as needed for moderate pain. (Patient taking differently: Take 1,000 mg by mouth as needed for moderate pain  (pain score 4-6).)     Ascorbic Acid  (VITAMIN C ) 500 MG tablet Take 500 mg by mouth daily.       bimatoprost (LUMIGAN) 0.01 % SOLN Place 1 drop into both eyes at bedtime.     Calcium  Carbonate Antacid (TUMS PO) Take 2 tablets by mouth 4 (four) times daily as needed (for acid reflux/indigestion).      Camphor-Menthol -Methyl Sal (SALONPAS ) 3.02-26-08 % PTCH Apply 1 patch topically daily as needed (pain).     cetirizine (ZYRTEC) 10 MG tablet Take 10 mg by mouth daily as needed for allergies. (Patient taking differently: Take 10 mg by mouth as needed for allergies.)     Cyanocobalamin  (VITAMIN B 12 PO) Take 1 tablet by mouth daily. (Patient taking differently: Take 1 tablet by mouth as needed.)     diclofenac  Sodium (VOLTAREN ) 1 % GEL Apply 2 g topically 4 (four) times daily. (Patient taking differently: Apply 2 g topically as needed.) 50 g 2   diltiazem  (CARDIZEM  CD) 300 MG 24 hr capsule Take 1 capsule (300 mg total) by mouth daily. 90 capsule 2   diltiazem  (CARDIZEM ) 60 MG tablet Take 1 every 8 hours as needed for fast heart rate and palpitations 60 tablet 1   dofetilide  (TIKOSYN ) 250 MCG capsule Take 1 capsule (250 mcg total) by mouth 2 (two) times daily. 60 capsule 3   DULoxetine  (CYMBALTA ) 60 MG capsule Take 1 capsule (60 mg total) by mouth daily. 90 capsule 1   ELIQUIS  5 MG TABS tablet TAKE ONE TABLET BY MOUTH TWICE DAILY 60 tablet 5   GARLIC  PO Take 1 tablet by mouth daily.     HYDROcodone -acetaminophen  (NORCO) 7.5-325 MG tablet Take 1 tablet by mouth every 6 (six) hours as needed for moderate pain (pain score 4-6). 30 tablet 0   levothyroxine  (SYNTHROID ) 25 MCG tablet Take 1 tablet (25 mcg total) by mouth daily before breakfast. 90 tablet 3   lidocaine  (HM LIDOCAINE  PATCH) 4 % Place 2 patches onto the skin daily. 10 patch 0   losartan  (COZAAR ) 50 MG tablet Take 1.5 tablets (75 mg total) by mouth daily. 135 tablet 3   MAGNESIUM  CITRATE PO Take 400 mg by mouth every morning.     metFORMIN   (GLUCOPHAGE ) 500 MG tablet Take 1 tablet (500 mg total) by mouth daily with breakfast. 180 tablet 0   metoprolol  succinate (TOPROL -XL) 100 MG 24 hr tablet TAKE 1 TABLET BY MOUTH DAILY WITH FOOD 90 tablet 2   nitroGLYCERIN  (NITROSTAT ) 0.4 MG SL tablet DISSOLVE 1 TAB UNDER TOUNGE FOR CHEST PAIN. MAY REPEAT EVERY 5 MINUTES FOR 3 DOSES. IF NO RELIEF CALL 911 OR GO TO ER 25 tablet 2   rosuvastatin  (CRESTOR ) 20 MG tablet TAKE ONE (1) TABLET BY MOUTH EVERY DAY 90 tablet 1   Suvorexant  (BELSOMRA ) 15 MG TABS Take 1 tablet (15 mg total) by mouth at bedtime as needed. 30 tablet 0   timolol  (TIMOPTIC ) 0.5 % ophthalmic solution Place 1 drop into both eyes daily.     TURMERIC PO Take 1 tablet by mouth daily.     VENTOLIN  HFA 108 (  90 Base) MCG/ACT inhaler Inhale 1-2 puffs into the lungs every 4 (four) hours as needed for shortness of breath. 18 g 1   Vitamin D , Cholecalciferol , 10 MCG (400 UNIT) CAPS Take 400 Units by mouth daily.     No current facility-administered medications for this encounter.    ROS- All systems are reviewed and negative except as per the HPI above.  Physical Exam: Vitals:   02/27/24 1524  BP: (!) 162/62  Pulse: (!) 58  Weight: 65.1 kg  Height: 5' 4 (1.626 m)    GEN: Well nourished, well developed in no acute distress CARDIAC: Regular rate and rhythm, no murmurs, rubs, gallops RESPIRATORY:  Clear to auscultation without rales, wheezing or rhonchi  ABDOMEN: Soft, non-tender, non-distended EXTREMITIES:  No edema; No deformity    Wt Readings from Last 3 Encounters:  02/27/24 65.1 kg  02/20/24 63.8 kg  02/12/24 64 kg    EKG Interpretation Date/Time:  Wednesday February 27 2024 15:41:57 EST Ventricular Rate:  58 PR Interval:  230 QRS Duration:  74 QT Interval:  474 QTC Calculation: 465 R Axis:   85  Text Interpretation: Sinus bradycardia with 1st degree A-V block with Premature supraventricular complexes Cannot rule out Anterior infarct (cited on or before  21-Jan-2024) Abnormal ECG When compared with ECG of 21-Jan-2024 15:47, No significant change was found Confirmed by Lenni Reckner ([id number]) on 02/27/2024 3:55:13 PM    Echo 12/31/23 demonstrated   1. Left ventricular ejection fraction, by estimation, is 55 to 60%. The  left ventricle has normal function. The left ventricle has no regional  wall motion abnormalities. Left ventricular diastolic parameters are  indeterminate.   2. Right ventricular systolic function is low normal. The right  ventricular size is normal. There is moderately elevated pulmonary artery  systolic pressure.   3. The mitral valve is abnormal. Mild mitral valve regurgitation. Mild  mitral stenosis. The mean mitral valve gradient is 5.0 mmHg.Heart rate 102  bpm. Moderate mitral annular calcification.   4. The tricuspid valve is abnormal. Tricuspid valve regurgitation is  moderate.   5. The aortic valve is tricuspid. There is mild calcification of the  aortic valve. There is mild thickening of the aortic valve. Aortic valve  regurgitation is moderate. No aortic stenosis is present.   6. The inferior vena cava is normal in size with greater than 50%  respiratory variability, suggesting right atrial pressure of 3 mmHg.   Comparison(s): A prior study was performed on 11//22/2023. EF 55-60%. RV  systolic function mildly reduced. Severe MAC. Moderate MR. Mild mitral  stenosis. Moderate calcification of the aortic valve. Mild AS. Mild to  moderate AI.    Epic records are reviewed at length today   CHA2DS2-VASc Score = 7  The patient's score is based upon: CHF History: 1 HTN History: 1 Diabetes History: 1 Stroke History: 0 Vascular Disease History: 1 Age Score: 2 Gender Score: 1       ASSESSMENT AND PLAN: Persistent Atrial Fibrillation (ICD10:  I48.19) The patient's CHA2DS2-VASc score is 7, indicating a 11.2% annual risk of stroke.   Previously failed amiodarone  due to thyroid  side effects. S/p dofetilide  loading  admission 12/2023 Patient appears to be maintaining SR Continue dofetilide  250 mcg BID Continue Eliquis  5 mg BID Continue diltiazem  300 mg daily Continue Toprol  100 mg daily  Secondary Hypercoagulable State (ICD10:  D68.69) The patient is at significant risk for stroke/thromboembolism based upon her CHA2DS2-VASc Score of 7.  Continue Apixaban  (Eliquis ). No bleeding issues.  High Risk Medication Monitoring (ICD 10: [id number]) Patient requires ongoing monitoring for anti-arrhythmic medication which has the potential to cause life threatening arrhythmias. QT interval on ECG acceptable for dofetilide  monitoring. Check bmet/mag today.     CAD No anginal symptoms Followed by Dr Debera  HTN Elevated today. Typical well controlled at previous office visits.  She will keep BP log at home for review at her upcoming visit with Dr Debera.   Chronic HFpEF EF 55-60% GDMT per primary cardiology team Fluid status appears stable today   Follow up in the AF clinic in 3 months.    Daril Kicks PA-C [facility] [address] [place], [place] [address] [phone] 02/27/2024 3:55 PM "

## 2024-02-28 ENCOUNTER — Ambulatory Visit (HOSPITAL_COMMUNITY): Payer: Self-pay | Admitting: Physician Assistant

## 2024-02-28 LAB — BASIC METABOLIC PANEL WITH GFR
BUN/Creatinine Ratio: 16 (ref 12–28)
BUN: 12 mg/dL (ref 8–27)
CO2: 27 mmol/L (ref 20–29)
Calcium: 9.8 mg/dL (ref 8.7–10.3)
Chloride: 101 mmol/L (ref 96–106)
Creatinine, Ser: 0.77 mg/dL (ref 0.57–1.00)
Glucose: 107 mg/dL — ABNORMAL HIGH (ref 70–99)
Potassium: 4.6 mmol/L (ref 3.5–5.2)
Sodium: 140 mmol/L (ref 134–144)
eGFR: 76 mL/min/1.73

## 2024-02-28 LAB — MAGNESIUM: Magnesium: 2.1 mg/dL (ref 1.6–2.3)

## 2024-03-01 ENCOUNTER — Encounter

## 2024-03-03 ENCOUNTER — Ambulatory Visit: Attending: Cardiovascular Disease

## 2024-03-03 ENCOUNTER — Encounter

## 2024-03-03 DIAGNOSIS — I4891 Unspecified atrial fibrillation: Secondary | ICD-10-CM | POA: Diagnosis not present

## 2024-03-03 LAB — CUP PACEART REMOTE DEVICE CHECK
Date Time Interrogation Session: 20260111230603
Implantable Pulse Generator Implant Date: 20220210

## 2024-03-05 ENCOUNTER — Ambulatory Visit: Payer: 59

## 2024-03-05 VITALS — BP 142/78 | HR 68 | Ht 64.0 in | Wt 140.0 lb

## 2024-03-05 DIAGNOSIS — Z0000 Encounter for general adult medical examination without abnormal findings: Secondary | ICD-10-CM | POA: Diagnosis not present

## 2024-03-05 NOTE — Progress Notes (Signed)
 "  Chief Complaint  Patient presents with   Medicare Wellness     Subjective:   AKISHA STURGILL is a 84 y.o. female who presents for a Medicare Annual Wellness Visit.  Visit info / Clinical Intake: Medicare Wellness Visit Type:: Subsequent Annual Wellness Visit Persons participating in visit and providing information:: patient Medicare Wellness Visit Mode:: Telephone If telephone:: video declined Since this visit was completed virtually, some vitals may be partially provided or unavailable. Missing vitals are due to the limitations of the virtual format.: Unable to obtain vitals - no equipment If Telephone or Video please confirm:: I connected with patient using audio/video enable telemedicine. I verified patient identity with two identifiers, discussed telehealth limitations, and patient agreed to proceed. Patient Location:: home Provider Location:: office Interpreter Needed?: No Pre-visit prep was completed: yes AWV questionnaire completed by patient prior to visit?: no Living arrangements:: with family/others Patient's Overall Health Status Rating: very good Typical amount of pain: none Does pain affect daily life?: no Are you currently prescribed opioids?: no  Dietary Habits and Nutritional Risks How many meals a day?: 2 Eats fruit and vegetables daily?: yes Most meals are obtained by: preparing own meals In the last 2 weeks, have you had any of the following?: none Diabetic:: (!) yes Any non-healing wounds?: no How often do you check your BS?: 0 Would you like to be referred to a Nutritionist or for Diabetic Management? : no  Functional Status Activities of Daily Living (to include ambulation/medication): Independent Ambulation: Independent Medication Administration: Independent (have walker/cane availuable if needed) Home Management (perform basic housework or laundry): Independent Manage your own finances?: yes Primary transportation is: family / friends Concerns  about vision?: no *vision screening is required for WTM* (last 03/04/24 Dr. Octavia) Concerns about hearing?: no  Fall Screening Falls in the past year?: 1 Number of falls in past year: 0 Was there an injury with Fall?: 0 Fall Risk Category Calculator: 1 Patient Fall Risk Level: Low Fall Risk  Fall Risk Patient at Risk for Falls Due to: Impaired balance/gait; Impaired mobility Fall risk Follow up: Falls evaluation completed; Education provided  Home and Transportation Safety: All rugs have non-skid backing?: yes All stairs or steps have railings?: yes Grab bars in the bathtub or shower?: yes Have non-skid surface in bathtub or shower?: yes Good home lighting?: yes Regular seat belt use?: yes Hospital stays in the last year:: (!) yes How many hospital stays:: 4 Reason: due to medications because of heart issues  Cognitive Assessment Difficulty concentrating, remembering, or making decisions? : yes Will 6CIT or Mini Cog be Completed: yes What year is it?: 0 points What month is it?: 0 points Give patient an address phrase to remember (5 components): [address]  Page About what time is it?: 0 points Count backwards from 20 to 1: 0 points Say the months of the year in reverse: 0 points Repeat the address phrase from earlier: 0 points 6 CIT Score: 0 points  Advance Directives (For Healthcare) Does Patient Have a Medical Advance Directive?: No Does patient want to make changes to medical advance directive?: No - Patient declined Type of Advance Directive: Living will Copy of Healthcare Power of Attorney in Chart?: No - copy requested Would patient like information on creating a medical advance directive?: No - Patient declined  Reviewed/Updated  Reviewed/Updated: Reviewed All (Medical, Surgical, Family, Medications, Allergies, Care Teams, Patient Goals); Medical History; Surgical History; Family History; Medications; Allergies; Care Teams; Patient  Goals  Allergies (verified) Patient has no known allergies.   Current Medications (verified) Outpatient Encounter Medications as of 03/05/2024  Medication Sig   acetaminophen  (TYLENOL ) 500 MG tablet Take 1,000 mg by mouth every 6 (six) hours as needed for moderate pain. (Patient taking differently: Take 1,000 mg by mouth as needed for moderate pain (pain score 4-6).)   apixaban  (ELIQUIS ) 5 MG TABS tablet Take 1 tablet (5 mg total) by mouth 2 (two) times daily.   Ascorbic Acid  (VITAMIN C ) 500 MG tablet Take 500 mg by mouth daily.     bimatoprost (LUMIGAN) 0.01 % SOLN Place 1 drop into both eyes at bedtime.   Calcium  Carbonate Antacid (TUMS PO) Take 2 tablets by mouth 4 (four) times daily as needed (for acid reflux/indigestion).    Camphor-Menthol -Methyl Sal (SALONPAS ) 3.02-26-08 % PTCH Apply 1 patch topically daily as needed (pain).   cetirizine (ZYRTEC) 10 MG tablet Take 10 mg by mouth daily as needed for allergies. (Patient taking differently: Take 10 mg by mouth as needed for allergies.)   Cyanocobalamin  (VITAMIN B 12 PO) Take 1 tablet by mouth daily. (Patient taking differently: Take 1 tablet by mouth as needed.)   diclofenac  Sodium (VOLTAREN ) 1 % GEL Apply 2 g topically 4 (four) times daily. (Patient taking differently: Apply 2 g topically as needed.)   diltiazem  (CARDIZEM  CD) 300 MG 24 hr capsule Take 1 capsule (300 mg total) by mouth daily.   diltiazem  (CARDIZEM ) 60 MG tablet Take 1 every 8 hours as needed for fast heart rate and palpitations   dofetilide  (TIKOSYN ) 250 MCG capsule Take 1 capsule (250 mcg total) by mouth 2 (two) times daily.   DULoxetine  (CYMBALTA ) 60 MG capsule Take 1 capsule (60 mg total) by mouth daily.   GARLIC  PO Take 1 tablet by mouth daily.   HYDROcodone -acetaminophen  (NORCO) 7.5-325 MG tablet Take 1 tablet by mouth every 6 (six) hours as needed for moderate pain (pain score 4-6).   levothyroxine  (SYNTHROID ) 25 MCG tablet Take 1 tablet (25 mcg total) by mouth  daily before breakfast.   lidocaine  (HM LIDOCAINE  PATCH) 4 % Place 2 patches onto the skin daily.   losartan  (COZAAR ) 50 MG tablet Take 1.5 tablets (75 mg total) by mouth daily.   MAGNESIUM  CITRATE PO Take 400 mg by mouth every morning.   metFORMIN  (GLUCOPHAGE ) 500 MG tablet Take 1 tablet (500 mg total) by mouth daily with breakfast.   metoprolol  succinate (TOPROL -XL) 100 MG 24 hr tablet TAKE 1 TABLET BY MOUTH DAILY WITH FOOD   nitroGLYCERIN  (NITROSTAT ) 0.4 MG SL tablet DISSOLVE 1 TAB UNDER TOUNGE FOR CHEST PAIN. MAY REPEAT EVERY 5 MINUTES FOR 3 DOSES. IF NO RELIEF CALL 911 OR GO TO ER   rosuvastatin  (CRESTOR ) 20 MG tablet TAKE ONE (1) TABLET BY MOUTH EVERY DAY   Suvorexant  (BELSOMRA ) 15 MG TABS Take 1 tablet (15 mg total) by mouth at bedtime as needed.   timolol  (TIMOPTIC ) 0.5 % ophthalmic solution Place 1 drop into both eyes daily.   TURMERIC PO Take 1 tablet by mouth daily.   VENTOLIN  HFA 108 (90 Base) MCG/ACT inhaler Inhale 1-2 puffs into the lungs every 4 (four) hours as needed for shortness of breath.   Vitamin D , Cholecalciferol , 10 MCG (400 UNIT) CAPS Take 400 Units by mouth daily.   No facility-administered encounter medications on file as of 03/05/2024.    History: Past Medical History:  Diagnosis Date   Anxiety    Arthritis    Bulging lumbar disc  Chronic lower back pain    Coronary atherosclerosis of native coronary artery    DES LAD 02/2008, DES LAD 02/2015, DES ramus and DES x2 mid RCA 02/2020   Depression    Dyslipidemia    Essential hypertension    Facial numbness    Frequent headaches    GERD (gastroesophageal reflux disease)    Glaucoma    Heart attack (HCC) 02/2020   Insomnia    Paroxysmal atrial fibrillation (HCC)    Diagnosed October 2019   PSVT (paroxysmal supraventricular tachycardia)    Refusal of blood transfusions as patient is Jehovah's Witness    Thyroid  disease    Type 2 diabetes mellitus (HCC)    Borderline   Vitamin D  deficiency    Past  Surgical History:  Procedure Laterality Date   ATRIAL FIBRILLATION ABLATION N/A 02/16/2021   Procedure: ATRIAL FIBRILLATION ABLATION;  Surgeon: Kelsie Agent, MD;  Location: [facility] INVASIVE CV LAB;  Service: Cardiovascular;  Laterality: N/A;   BIOPSY  09/21/2021   Procedure: BIOPSY;  Surgeon: Shaaron Lamar HERO, MD;  Location: [facility];  Service: Endoscopy;;  gastric   CARDIAC CATHETERIZATION N/A 02/23/2015   Procedure: Left Heart Cath and Coronary Angiography;  Surgeon: Ozell Fell, MD;  Location: [facility] INVASIVE CV LAB;  Service: Cardiovascular;  Laterality: N/A;   CARDIAC CATHETERIZATION N/A 02/23/2015   Procedure: Coronary Stent Intervention;  Surgeon: Ozell Fell, MD;  Location: [facility] INVASIVE CV LAB;  Service: Cardiovascular;  Laterality: N/A;   CARDIAC CATHETERIZATION N/A 01/04/2016   Procedure: Left Heart Cath and Coronary Angiography;  Surgeon: Peter M Jordan, MD;  Location: [facility] INVASIVE CV LAB;  Service: Cardiovascular;  Laterality: N/A;   CARDIOVERSION N/A 01/10/2024   Procedure: CARDIOVERSION;  Surgeon: Francyne Headland, MD;  Location: [facility] INVASIVE CV LAB;  Service: Cardiovascular;  Laterality: N/A;   CATARACT EXTRACTION     CORONARY ANGIOPLASTY  02/23/2015   CORONARY ANGIOPLASTY WITH STENT PLACEMENT  2009   CORONARY STENT INTERVENTION N/A 03/09/2020   Procedure: CORONARY STENT INTERVENTION;  Surgeon: Dann Candyce RAMAN, MD;  Location: [facility] INVASIVE CV LAB;  Service: Cardiovascular;  Laterality: N/A;   DILATION AND CURETTAGE OF UTERUS     ESOPHAGOGASTRODUODENOSCOPY (EGD) WITH PROPOFOL  N/A 09/21/2021   normal esophagus, abnormal gastric mucosa s/p biopsy, normal duodenum. Pathology with H.pylori. Prescribed Prevpac.   implantable loop recorder placement  04/01/2020   [employer]onic Reveal [place] model LNQ 22 [id number]) implantable loop recorder    LEFT HEART CATH AND CORONARY ANGIOGRAPHY N/A 03/09/2020   Procedure: LEFT HEART CATH AND CORONARY ANGIOGRAPHY;  Surgeon: Dann Candyce RAMAN, MD;   Location: [facility] INVASIVE CV LAB;  Service: Cardiovascular;  Laterality: N/A;   TUBAL LIGATION     Family History  Problem Relation Age of Onset   Other Mother        natural causes   Heart disease Mother    Cancer Father        unsure of type   Colon cancer Daughter        2011   Social History   Occupational History   Occupation: RETIRED  Tobacco Use   Smoking status: Never    Passive exposure: Never   Smokeless tobacco: Never   Tobacco comments:    Never smoke 11/15/21  Vaping Use   Vaping status: Never Used  Substance and Sexual Activity   Alcohol use: Not Currently    Alcohol/week: 1.0 standard drink of alcohol    Types: 1 Glasses of wine per week  Comment: RARE OCCASION   Drug use: No   Sexual activity: Not Currently   Tobacco Counseling Counseling given: Yes Tobacco comments: Never smoke 11/15/21  SDOH Screenings   Food Insecurity: No Food Insecurity (03/05/2024)  Housing: Low Risk (03/05/2024)  Transportation Needs: No Transportation Needs (03/05/2024)  Utilities: Not At Risk (03/05/2024)  Alcohol Screen: Low Risk (03/05/2023)  Depression (PHQ2-9): Low Risk (03/05/2024)  Recent Concern: Depression (PHQ2-9) - High Risk (12/28/2023)  Financial Resource Strain: Low Risk (03/21/2023)   Received from [facility]  Physical Activity: Inactive (03/05/2024)  Social Connections: Moderately Isolated (03/05/2024)  Stress: No Stress Concern Present (03/05/2024)  Tobacco Use: Low Risk (03/05/2024)  Health Literacy: Adequate Health Literacy (03/05/2024)   See flowsheets for full screening details  Depression Screen PHQ 2 & 9 Depression Scale- Over the past 2 weeks, how often have you been bothered by any of the following problems? Little interest or pleasure in doing things: 0 Feeling down, depressed, or hopeless (PHQ Adolescent also includes...irritable): 0 PHQ-2 Total Score: 0 Trouble falling or staying asleep, or sleeping too much: 3 Feeling tired or having little  energy: 3 Poor appetite or overeating (PHQ Adolescent also includes...weight loss): 3 Feeling bad about yourself - or that you are a failure or have let yourself or your family down: 1 Trouble concentrating on things, such as reading the newspaper or watching television (PHQ Adolescent also includes...like school work): 1 Moving or speaking so slowly that other people could have noticed. Or the opposite - being so fidgety or restless that you have been moving around a lot more than usual: 0 Thoughts that you would be better off dead, or of hurting yourself in some way: 0 PHQ-9 Total Score: 12 If you checked off any problems, how difficult have these problems made it for you to do your work, take care of things at home, or get along with other people?: Not difficult at all  Depression Treatment Depression Interventions/Treatment : Currently on Treatment     Goals Addressed             This Visit's Progress    Increase physical activity               Objective:    Today's Vitals   03/05/24 1152  BP: (!) 142/78  Pulse: 68  Weight: 140 lb (63.5 kg)  Height: 5' 4 (1.626 m)   Body mass index is 24.03 kg/m.  Hearing/Vision screen No results found. Immunizations and Health Maintenance Health Maintenance  Topic Date Due   COVID-19 Vaccine (6 - 2025-26 season) 10/22/2023   FOOT EXAM  03/05/2024   Diabetic kidney evaluation - Urine ACR  07/12/2024   HEMOGLOBIN A1C  07/15/2024   OPHTHALMOLOGY EXAM  08/27/2024   Diabetic kidney evaluation - eGFR measurement  02/26/2025   Medicare Annual Wellness (AWV)  03/05/2025   DTaP/Tdap/Td (2 - Td or Tdap) 12/23/2028   Pneumococcal Vaccine: 50+ Years  Completed   Influenza Vaccine  Completed   Bone Density Scan  Completed   Zoster Vaccines- Shingrix   Completed   Meningococcal B Vaccine  Aged Out        Assessment/Plan:  This is a routine wellness examination for Jalyssa.  Patient Care Team: Severa Rock HERO, FNP as PCP - General  (Family Medicine) [name], MD as PCP - Cardiology (Cardiology) Mealor, Eulas BRAVO, MD as PCP - Electrophysiology (Cardiology) [name], MD as Consulting Physician (Otolaryngology) [name], RPH-CPP (Pharmacist) [name], NP as  Nurse Practitioner (Endocrinology) [facility] Bertrum Rosina HERO, RN as [facility] (General Practice) Bertrum Rosina HERO, RN  I have personally reviewed and noted the following in the patients chart:   Medical and social history Use of alcohol, tobacco or illicit drugs  Current medications and supplements including opioid prescriptions. Functional ability and status Nutritional status Physical activity Advanced directives List of other physicians Hospitalizations, surgeries, and ER visits in previous 12 months Vitals Screenings to include cognitive, depression, and falls Referrals and appointments  No orders of the defined types were placed in this encounter.  In addition, I have reviewed and discussed with patient certain preventive protocols, quality metrics, and best practice recommendations. A written personalized care plan for preventive services as well as general preventive health recommendations were provided to patient.   Ozie Ned, CMA   03/05/2024   Return in 1 year (on 03/05/2025).  After Visit Summary: (MyChart) Due to this being a telephonic visit, the after visit summary with patients personalized plan was offered to patient via MyChart   Nurse Notes: pt is aware Covid vaccine, foot exam, per pt will get Covid and foot exam at next OV "

## 2024-03-05 NOTE — Patient Instructions (Signed)
 [name],  Thank you for taking the time for your Medicare Wellness Visit. I appreciate your continued commitment to your health goals. Please review the care plan we discussed, and feel free to reach out if I can assist you further.  Please note that Annual Wellness Visits do not include a physical exam. Some assessments may be limited, especially if the visit was conducted virtually. If needed, we may recommend an in-person follow-up with your provider.  Ongoing Care Seeing your primary care provider every 3 to 6 months helps us  monitor your health and provide consistent, personalized care.   Referrals If a referral was made during today's visit and you haven't received any updates within two weeks, please contact the referred provider directly to check on the status.  Recommended Screenings:  Health Maintenance  Topic Date Due   COVID-19 Vaccine (6 - 2025-26 season) 10/22/2023   Medicare Annual Wellness Visit  03/04/2024   Complete foot exam   03/05/2024   Yearly kidney health urinalysis for diabetes  07/12/2024   Hemoglobin A1C  07/15/2024   Eye exam for diabetics  08/27/2024   Yearly kidney function blood test for diabetes  02/26/2025   DTaP/Tdap/Td vaccine (2 - Td or Tdap) 12/23/2028   Pneumococcal Vaccine for age over 24  Completed   Flu Shot  Completed   Osteoporosis screening with Bone Density Scan  Completed   Zoster (Shingles) Vaccine  Completed   Meningitis B Vaccine  Aged Out       03/05/2024   11:56 AM  Advanced Directives  Does Patient Have a Medical Advance Directive? No  Would patient like information on creating a medical advance directive? No - Patient declined    Vision: Annual vision screenings are recommended for early detection of glaucoma, cataracts, and diabetic retinopathy. These exams can also reveal signs of chronic conditions such as diabetes and high blood pressure.  Dental: Annual dental screenings help detect early signs of oral cancer, gum  disease, and other conditions linked to overall health, including heart disease and diabetes.  Please see the attached documents for additional preventive care recommendations.

## 2024-03-07 ENCOUNTER — Ambulatory Visit: Payer: Self-pay | Admitting: Cardiovascular Disease

## 2024-03-07 NOTE — Progress Notes (Signed)
 Remote Loop Recorder Transmission

## 2024-03-11 ENCOUNTER — Ambulatory Visit: Admitting: Cardiology

## 2024-03-12 ENCOUNTER — Encounter: Payer: Self-pay | Admitting: Cardiology

## 2024-03-12 ENCOUNTER — Ambulatory Visit: Attending: Cardiology | Admitting: Cardiology

## 2024-03-12 VITALS — BP 122/82 | HR 64 | Ht 64.0 in | Wt 142.8 lb

## 2024-03-12 DIAGNOSIS — I10 Essential (primary) hypertension: Secondary | ICD-10-CM

## 2024-03-12 DIAGNOSIS — E782 Mixed hyperlipidemia: Secondary | ICD-10-CM | POA: Diagnosis not present

## 2024-03-12 DIAGNOSIS — I4891 Unspecified atrial fibrillation: Secondary | ICD-10-CM | POA: Diagnosis not present

## 2024-03-12 DIAGNOSIS — I25119 Atherosclerotic heart disease of native coronary artery with unspecified angina pectoris: Secondary | ICD-10-CM | POA: Diagnosis not present

## 2024-03-12 DIAGNOSIS — I4819 Other persistent atrial fibrillation: Secondary | ICD-10-CM | POA: Diagnosis not present

## 2024-03-12 NOTE — Patient Instructions (Addendum)

## 2024-03-12 NOTE — Progress Notes (Signed)
 "    Cardiology Office Note  Date: 03/12/2024   ID: [name], DOB 03/20/1940, MRN [id number]  History of Present Illness: [name] is an 84 y.o. female last seen in July 2025.  She has had interval follow-up with Dr. Nancey and also in the atrial fibrillation clinic.  Started on Tikosyn  with admission in November 2025.  She is here today with family member for a follow-up visit, states that she has been doing reasonably well.  No angina or increasing shortness of breath, no palpitations.  Her blood pressure is well-controlled today.  ILR with interrogation in November and December 2025 showed less than 1% AF burden.  We went over her medications.  Cardiac regimen remains stable.  She does not report any spontaneous bleeding problems on Eliquis .  I did review her interval lab work.  I reviewed her ECG today which shows sinus rhythm with PAC, QTc 468 ms.  Physical Exam: VS:  BP 122/82 (BP Location: Left Arm)   Pulse 64   Ht 5' 4 (1.626 m)   Wt 142 lb 12.8 oz (64.8 kg)   LMP  (LMP Unknown)   SpO2 99%   BMI 24.51 kg/m , BMI Body mass index is 24.51 kg/m.  Wt Readings from Last 3 Encounters:  03/12/24 142 lb 12.8 oz (64.8 kg)  03/05/24 140 lb (63.5 kg)  02/27/24 143 lb 8 oz (65.1 kg)    General: Patient appears comfortable at rest. HEENT: Conjunctiva and lids normal. Neck: Supple, no elevated JVP or carotid bruits. Lungs: Clear to auscultation, nonlabored breathing at rest. Cardiac: Regular rate and rhythm, no S3, 1/6 systolic murmur. Extremities: No pitting edema.  ECG:  An ECG dated 02/27/2024 was personally reviewed today and demonstrated:  Sinus bradycardia with prolonged PR interval.  Labwork: 12/11/2023: ALT 25; AST 24; Hemoglobin 11.9; Platelets 196; Pro Brain Natriuretic Peptide 2,634.0 02/08/2024: TSH 0.857 02/27/2024: BUN 12; Creatinine, Ser 0.77; Magnesium  2.1; Potassium 4.6; Sodium 140   Other Studies Reviewed Today:  Echocardiogram 12/31/2023:  1. Left  ventricular ejection fraction, by estimation, is 55 to 60%. The  left ventricle has normal function. The left ventricle has no regional  wall motion abnormalities. Left ventricular diastolic parameters are  indeterminate.   2. Right ventricular systolic function is low normal. The right  ventricular size is normal. There is moderately elevated pulmonary artery  systolic pressure.   3. The mitral valve is abnormal. Mild mitral valve regurgitation. Mild  mitral stenosis. The mean mitral valve gradient is 5.0 mmHg.Heart rate 102  bpm. Moderate mitral annular calcification.   4. The tricuspid valve is abnormal. Tricuspid valve regurgitation is  moderate.   5. The aortic valve is tricuspid. There is mild calcification of the  aortic valve. There is mild thickening of the aortic valve. Aortic valve  regurgitation is moderate. No aortic stenosis is present.   6. The inferior vena cava is normal in size with greater than 50%  respiratory variability, suggesting right atrial pressure of 3 mmHg.   Assessment and Plan:  1.  CAD status post DES to the LAD in 2010, DES to the LAD in 2017, and DES to the ramus intermedius as well as DES x 2 to the mid RCA in 2022.  LVEF 55 to 60% by echocardiogram in November 2023.  No active angina at this time, she remains on statin therapy and has as needed nitroglycerin  available.   2.  Persistent atrial fibrillation with CHA2DS2-VASc score of 7 status post  atrial fibrillation ablation by Dr. Kelsie in December 2022.  Previously taken off of amiodarone  due to hypothyroidism.  Status post Tikosyn  admission in November 2025.  Rhythm has been well-controlled based on ILR interrogation, less than 1% in the last 2 months.  Continue Eliquis  5 mg twice daily for stroke prophylaxis, also on Cardizem  CD 300 mg daily and Toprol -XL 100 mg daily.   3.  Primary hypertension.  Blood pressure control looks good today.  Continue to monitor.  She is also on Cozaar  75 mg daily   4.   Mixed hyperlipidemia on Crestor  20 mg daily.  LDL 51 in December 2023.   Disposition:  Follow up 6 months.  Signed, Jayson JUDITHANN Sierras, M.D., F.A.C.C. [facility]

## 2024-03-13 ENCOUNTER — Encounter

## 2024-03-24 ENCOUNTER — Other Ambulatory Visit: Payer: Self-pay

## 2024-03-24 ENCOUNTER — Other Ambulatory Visit: Payer: Self-pay | Admitting: Cardiology

## 2024-03-24 DIAGNOSIS — F5101 Primary insomnia: Secondary | ICD-10-CM

## 2024-03-24 NOTE — Telephone Encounter (Signed)
 Copied from CRM [id number]. Topic: Clinical - Medication Refill >> Mar 24, 2024 11:38 AM Victoria B wrote: Medication: Suvorexant  (BELSOMRA ) 15 MG TABS   Has the patient contacted their pharmacy? no This is the patient's preferred pharmacy:  [facility], [place] - [address] [place] [address] Phone: [phone] Fax: [phone]  Is this the correct pharmacy for this prescription? yes   Has the prescription been filled recently? no  Is the patient out of the medication? Out of the hydrocodone , almost out of the belsomra   Has the patient been seen for an appointment in the last year OR does the patient have an upcoming appointment? yes  Can we respond through MyChart? no  Agent: Please be advised that Rx refills may take up to 3 business days. We ask that you follow-up with your pharmacy.

## 2024-03-27 ENCOUNTER — Other Ambulatory Visit: Payer: Self-pay

## 2024-03-27 DIAGNOSIS — G8929 Other chronic pain: Secondary | ICD-10-CM

## 2024-03-27 DIAGNOSIS — F5101 Primary insomnia: Secondary | ICD-10-CM

## 2024-03-27 DIAGNOSIS — Z79899 Other long term (current) drug therapy: Secondary | ICD-10-CM

## 2024-03-27 NOTE — Telephone Encounter (Signed)
 In accordance with refill protocols, please review and address the following requirements before this medication refill can be authorized:  Labs

## 2024-03-28 ENCOUNTER — Encounter: Payer: Self-pay | Admitting: Family Medicine

## 2024-03-28 ENCOUNTER — Ambulatory Visit: Admitting: Family Medicine

## 2024-03-28 DIAGNOSIS — G8929 Other chronic pain: Secondary | ICD-10-CM

## 2024-03-28 DIAGNOSIS — Z79899 Other long term (current) drug therapy: Secondary | ICD-10-CM

## 2024-03-28 DIAGNOSIS — F5101 Primary insomnia: Secondary | ICD-10-CM

## 2024-03-28 MED ORDER — HYDROCODONE-ACETAMINOPHEN 7.5-325 MG PO TABS: 1.0000 | ORAL_TABLET | Freq: Four times a day (QID) | ORAL | 0 refills | Status: AC | PRN

## 2024-03-28 MED ORDER — BELSOMRA 20 MG PO TABS: 20.0000 mg | ORAL_TABLET | Freq: Every evening | ORAL | 5 refills | Status: AC | PRN

## 2024-03-28 MED ORDER — METHYLPREDNISOLONE ACETATE 80 MG/ML IJ SUSP
80.0000 mg | Freq: Once | INTRAMUSCULAR | Status: AC
  Administered 2024-03-28: 60 mg via INTRAMUSCULAR

## 2024-03-28 NOTE — Progress Notes (Signed)
 "    Subjective:  Patient ID: [name], female    DOB: 1940/07/09, 84 y.o.   MRN: [id number]  Patient Care Team: [name], FNP as PCP - General (Family Medicine) [name], MD as PCP - Cardiology (Cardiology) Mealor, Eulas BRAVO, MD as PCP - Electrophysiology (Cardiology) [name], MD as Consulting Physician (Otolaryngology) [name], RPH-CPP (Pharmacist) [name], NP as Nurse Practitioner (Endocrinology) [facility] [name], RN as [facility] Care Management (General Practice) [name], RN   Chief Complaint:  Insomnia   HPI: Julie BOULTINGHOUSE is a 84 y.o. female presenting on 03/28/2024 for Insomnia   [name] is an 84 year old female with insomnia and chronic pain who presents for medication management and pain evaluation.  She experiences ongoing insomnia, currently managed with Belsomra  15 mg, which provides inconsistent relief. Her bedtime routine includes watching TV and avoiding stimulating activities. She consumes a small glass of tea with meals and denies excessive caffeine intake. Occasionally, she experiences racing thoughts but often cannot pinpoint a specific cause for her insomnia. She typically wakes around 2 AM to use the bathroom and struggles to return to sleep due to leg pain.  Chronic pain is primarily in her hips, more severe on the right side, with radiation down her leg. The pain is exacerbated by coughing due to a recent cold. She previously managed with hydrocodone  once daily but now requires it twice daily due to increased pain. She recalls receiving hip injections from a previous doctor, which were initially effective but later discontinued due to concerns about bone health and diabetes impact.  She has a history of diabetes, with stable blood sugar levels and no symptoms of increased hunger, thirst, or urination. Her last A1c check was in November, and she is due for a diabetic check in a few  weeks. She reports good control of her condition.  She is currently taking Tikosyn  for heart issues and reports no side effects. She sees her cardiologist every six months, with the next appointment scheduled for the 21st. She also takes B12 supplements at home.        Pain assessment: Cause of pain- chronic back pain Pain location- back, hips and legs Pain on scale of 1-10- 8/10 Frequency- daily What increases pain-any activity, certain positions  What makes pain Better-medication at times Effects on ADL - minimal to moderate depending on pain severity Any change in general medical condition-no  Current opioids rx- hydrocodone  7.5 # meds rx- 30 Effectiveness of current meds-minimal Adverse reactions from pain meds-constipation  Morphine  equivalent- 30 MME/Day  Pill count performed-No Last drug screen - 09/2023 ( high risk q9m, moderate risk q75m, low risk yearly ) Urine drug screen today- No Was the NCCSR reviewed- yes  If yes were their any concerning findings? - no     Pain contract signed on:   Relevant past medical, surgical, family, and social history reviewed and updated as indicated.  Allergies and medications reviewed and updated. Data reviewed: Chart in Epic.   Past Medical History:  Diagnosis Date   Anxiety    Arthritis    Bulging lumbar disc    Chronic lower back pain    Coronary atherosclerosis of native coronary artery    DES LAD 02/2008, DES LAD 02/2015, DES ramus and DES x2 mid RCA 02/2020   Depression    Dyslipidemia    Essential hypertension    Facial numbness  Frequent headaches    GERD (gastroesophageal reflux disease)    Glaucoma    Heart attack (HCC) 02/2020   Insomnia    Paroxysmal atrial fibrillation [facility])    Diagnosed October 2019   PSVT (paroxysmal supraventricular tachycardia)    Refusal of blood transfusions as patient is Jehovah's Witness    Thyroid  disease    Type 2 diabetes mellitus (HCC)    Borderline   Vitamin D  deficiency      Past Surgical History:  Procedure Laterality Date   ATRIAL FIBRILLATION ABLATION N/A 02/16/2021   Procedure: ATRIAL FIBRILLATION ABLATION;  Surgeon: Kelsie Agent, MD;  Location: [facility] INVASIVE CV LAB;  Service: Cardiovascular;  Laterality: N/A;   BIOPSY  09/21/2021   Procedure: BIOPSY;  Surgeon: Shaaron Lamar HERO, MD;  Location: [facility] ENDO SUITE;  Service: Endoscopy;;  gastric   CARDIAC CATHETERIZATION N/A 02/23/2015   Procedure: Left Heart Cath and Coronary Angiography;  Surgeon: Ozell Fell, MD;  Location: [facility] INVASIVE CV LAB;  Service: Cardiovascular;  Laterality: N/A;   CARDIAC CATHETERIZATION N/A 02/23/2015   Procedure: Coronary Stent Intervention;  Surgeon: Ozell Fell, MD;  Location: [facility] INVASIVE CV LAB;  Service: Cardiovascular;  Laterality: N/A;   CARDIAC CATHETERIZATION N/A 01/04/2016   Procedure: Left Heart Cath and Coronary Angiography;  Surgeon: Peter M Jordan, MD;  Location: [facility] INVASIVE CV LAB;  Service: Cardiovascular;  Laterality: N/A;   CARDIOVERSION N/A 01/10/2024   Procedure: CARDIOVERSION;  Surgeon: Francyne Headland, MD;  Location: [facility] INVASIVE CV LAB;  Service: Cardiovascular;  Laterality: N/A;   CATARACT EXTRACTION     CORONARY ANGIOPLASTY  02/23/2015   CORONARY ANGIOPLASTY WITH STENT PLACEMENT  2009   CORONARY STENT INTERVENTION N/A 03/09/2020   Procedure: CORONARY STENT INTERVENTION;  Surgeon: Dann Candyce RAMAN, MD;  Location: [facility] INVASIVE CV LAB;  Service: Cardiovascular;  Laterality: N/A;   DILATION AND CURETTAGE OF UTERUS     ESOPHAGOGASTRODUODENOSCOPY (EGD) WITH PROPOFOL  N/A 09/21/2021   normal esophagus, abnormal gastric mucosa s/p biopsy, normal duodenum. Pathology with H.pylori. Prescribed Prevpac.   implantable loop recorder placement  04/01/2020   [employer]onic Reveal [place] model LNQ [id number]) implantable loop recorder    LEFT HEART CATH AND CORONARY ANGIOGRAPHY N/A 03/09/2020   Procedure: LEFT HEART CATH AND CORONARY ANGIOGRAPHY;  Surgeon: Dann Candyce RAMAN, MD;  Location: [facility] INVAS[facility] CV LAB;  Service: Cardiovascular;  Laterality: N/A;   TUBAL LIGATION      Social History   Socioeconomic History   Marital status: Widowed    Spouse name: Not on file   Number of children: 8   Years of education: 10th grade   Highest education level: 10th grade  Occupational History   Occupation: RETIRED  Tobacco Use   Smoking status: Never    Passive exposure: Never   Smokeless tobacco: Never   Tobacco comments:    Never smoke 11/15/21  Vaping Use   Vaping status: Never Used  Substance and Sexual Activity   Alcohol use: Not Currently    Alcohol/week: 1.0 standard drink of alcohol    Types: 1 Glasses of wine per week    Comment: RARE OCCASION   Drug use: No   Sexual activity: Not Currently  Other Topics Concern   Not on file  Social History Narrative   Lives at home alone.   Right-handed.   No caffeine use.   Daughter lives next door and helps her out   Social Drivers of Health   Tobacco Use: Low Risk (03/28/2024)   Patient  History    Smoking Tobacco Use: Never    Smokeless Tobacco Use: Never    Passive Exposure: Never  Financial Resource Strain: Low Risk (03/21/2023)   Received from [facility]   Overall Financial Resource Strain (CARDIA)    Difficulty of Paying Living Expenses: Not very hard  Food Insecurity: No Food Insecurity (03/05/2024)   Epic    Worried About Programme Researcher, Broadcasting/film/video in the Last Year: Never true    Ran Out of Food in the Last Year: Never true  Transportation Needs: No Transportation Needs (03/05/2024)   Epic    Lack of Transportation (Medical): No    Lack of Transportation (Non-Medical): No  Physical Activity: Inactive (03/05/2024)   Exercise Vital Sign    Days of Exercise per Week: 0 days    Minutes of Exercise per Session: 0 min  Stress: No Stress Concern Present (03/05/2024)   [employer] of Occupational Health - Occupational Stress Questionnaire    Feeling of Stress: Only a little  Social  Connections: Moderately Isolated (03/05/2024)   Social Connection and Isolation Panel    Frequency of Communication with Friends and Family: More than three times a week    Frequency of Social Gatherings with Friends and Family: Three times a week    Attends Religious Services: More than 4 times per year    Active Member of Clubs or Organizations: No    Attends Banker Meetings: Never    Marital Status: Widowed  Intimate Partner Violence: Not At Risk (03/05/2024)   Epic    Fear of Current or Ex-Partner: No    Emotionally Abused: No    Physically Abused: No    Sexually Abused: No  Depression (PHQ2-9): Medium Risk (03/28/2024)   Depression (PHQ2-9)    PHQ-2 Score: 8  Alcohol Screen: Low Risk (03/05/2023)   Alcohol Screen    Last Alcohol Screening Score (AUDIT): 0  Housing: Low Risk (03/05/2024)   Epic    Unable to Pay for Housing in the Last Year: No    Number of Times Moved in the Last Year: 0    Homeless in the Last Year: No  Utilities: Not At Risk (03/05/2024)   Epic    Threatened with loss of utilities: No  Health Literacy: Adequate Health Literacy (03/05/2024)   B1300 Health Literacy    Frequency of need for help with medical instructions: Never    Outpatient Encounter Medications as of 03/28/2024  Medication Sig   acetaminophen  (TYLENOL ) 500 MG tablet Take 1,000 mg by mouth every 6 (six) hours as needed for moderate pain. (Patient taking differently: Take 1,000 mg by mouth as needed for moderate pain (pain score 4-6).)   apixaban  (ELIQUIS ) 5 MG TABS tablet Take 1 tablet (5 mg total) by mouth 2 (two) times daily.   Ascorbic Acid  (VITAMIN C ) 500 MG tablet Take 500 mg by mouth daily.     bimatoprost (LUMIGAN) 0.01 % SOLN Place 1 drop into both eyes at bedtime.   Calcium  Carbonate Antacid (TUMS PO) Take 2 tablets by mouth 4 (four) times daily as needed (for acid reflux/indigestion).    Camphor-Menthol -Methyl Sal (SALONPAS ) 3.02-26-08 % PTCH Apply 1 patch topically daily as  needed (pain).   cetirizine (ZYRTEC) 10 MG tablet Take 10 mg by mouth daily as needed for allergies. (Patient taking differently: Take 10 mg by mouth as needed for allergies.)   Cyanocobalamin  (VITAMIN B 12 PO) Take 1 tablet by mouth daily. (Patient taking differently: Take 1 tablet  by mouth as needed.)   diclofenac  Sodium (VOLTAREN ) 1 % GEL Apply 2 g topically 4 (four) times daily. (Patient taking differently: Apply 2 g topically as needed.)   diltiazem  (CARDIZEM  CD) 300 MG 24 hr capsule Take 1 capsule (300 mg total) by mouth daily.   diltiazem  (CARDIZEM ) 60 MG tablet Take 1 every 8 hours as needed for fast heart rate and palpitations   dofetilide  (TIKOSYN ) 250 MCG capsule Take 1 capsule (250 mcg total) by mouth 2 (two) times daily.   DULoxetine  (CYMBALTA ) 60 MG capsule Take 1 capsule (60 mg total) by mouth daily.   GARLIC  PO Take 1 tablet by mouth daily.   levothyroxine  (SYNTHROID ) 25 MCG tablet Take 1 tablet (25 mcg total) by mouth daily before breakfast.   lidocaine  (HM LIDOCAINE  PATCH) 4 % Place 2 patches onto the skin daily.   losartan  (COZAAR ) 50 MG tablet Take 1.5 tablets (75 mg total) by mouth daily.   MAGNESIUM  CITRATE PO Take 400 mg by mouth every morning.   metFORMIN  (GLUCOPHAGE ) 500 MG tablet Take 1 tablet (500 mg total) by mouth daily with breakfast.   metoprolol  succinate (TOPROL -XL) 100 MG 24 hr tablet TAKE 1 TABLET BY MOUTH DAILY WITH FOOD   nitroGLYCERIN  (NITROSTAT ) 0.4 MG SL tablet DISSOLVE 1 TAB UNDER TOUNGE FOR CHEST PAIN. MAY REPEAT EVERY 5 MINUTES FOR 3 DOSES. IF NO RELIEF CALL 911 OR GO TO ER   rosuvastatin  (CRESTOR ) 20 MG tablet TAKE ONE (1) TABLET BY MOUTH EVERY DAY   Suvorexant  (BELSOMRA ) 15 MG TABS Take 1 tablet (15 mg total) by mouth at bedtime as needed.   Suvorexant  (BELSOMRA ) 20 MG TABS Take 1 tablet (20 mg total) by mouth at bedtime as needed.   timolol  (TIMOPTIC ) 0.5 % ophthalmic solution Place 1 drop into both eyes daily.   TURMERIC PO Take 1 tablet by mouth  daily.   VENTOLIN  HFA 108 (90 Base) MCG/ACT inhaler Inhale 1-2 puffs into the lungs every 4 (four) hours as needed for shortness of breath.   Vitamin D , Cholecalciferol , 10 MCG (400 UNIT) CAPS Take 400 Units by mouth daily.   [DISCONTINUED] HYDROcodone -acetaminophen  (NORCO) 7.5-325 MG tablet Take 1 tablet by mouth every 6 (six) hours as needed for moderate pain (pain score 4-6).   HYDROcodone -acetaminophen  (NORCO) 7.5-325 MG tablet Take 1 tablet by mouth every 6 (six) hours as needed for moderate pain (pain score 4-6).   [START ON 05/27/2024] HYDROcodone -acetaminophen  (NORCO) 7.5-325 MG tablet Take 1 tablet by mouth every 6 (six) hours as needed for moderate pain (pain score 4-6).   [START ON 06/26/2024] HYDROcodone -acetaminophen  (NORCO) 7.5-325 MG tablet Take 1 tablet by mouth every 6 (six) hours as needed for moderate pain (pain score 4-6).   [EXPIRED] methylPREDNISolone  acetate (DEPO-MEDROL ) injection 80 mg    No facility-administered encounter medications on file as of 03/28/2024.    Allergies[1]  Pertinent ROS per HPI, otherwise unremarkable      Objective:  BP (!) 169/72   Pulse 69   Temp 97.7 F (36.5 C)   Ht 5' 4 (1.626 m)   Wt 146 lb 9.6 oz (66.5 kg)   LMP  (LMP Unknown)   SpO2 97%   BMI 25.16 kg/m    Wt Readings from Last 3 Encounters:  03/28/24 146 lb 9.6 oz (66.5 kg)  03/12/24 142 lb 12.8 oz (64.8 kg)  03/05/24 140 lb (63.5 kg)    Physical Exam Vitals and nursing note reviewed.  Constitutional:      General: She  is not in acute distress.    Appearance: Normal appearance. She is not ill-appearing, toxic-appearing or diaphoretic.  HENT:     Head: Normocephalic and atraumatic.     Nose: Nose normal.     Mouth/Throat:     Mouth: Mucous membranes are moist.  Eyes:     Conjunctiva/sclera: Conjunctivae normal.     Pupils: Pupils are equal, round, and reactive to light.  Cardiovascular:     Rate and Rhythm: Normal rate and regular rhythm.     Heart sounds: Murmur  heard.     Systolic murmur is present.  Pulmonary:     Effort: Pulmonary effort is normal.     Breath sounds: Normal breath sounds.  Musculoskeletal:     Cervical back: Neck supple.     Thoracic back: Normal.     Lumbar back: Tenderness present. No swelling, edema, deformity, signs of trauma, lacerations, spasms or bony tenderness. Decreased range of motion. Positive right straight leg raise test and positive left straight leg raise test. No scoliosis.     Right hip: Normal.     Left hip: Normal.     Right lower leg: No edema.     Left lower leg: No edema.  Skin:    General: Skin is warm and dry.     Capillary Refill: Capillary refill takes less than 2 seconds.  Neurological:     General: No focal deficit present.     Mental Status: She is alert and oriented to person, place, and time.  Psychiatric:        Mood and Affect: Mood normal.        Behavior: Behavior normal.        Thought Content: Thought content normal.        Judgment: Judgment normal.       Results for orders placed or performed in visit on 03/03/24  CUP PACEART REMOTE DEVICE CHECK   Collection Time: 03/02/24 11:06 PM  Result Value Ref Range   Date Time Interrogation Session [phone]    Pulse Generator Manufacturer MERM    Pulse Gen Model LNQ22 LINQ II    Pulse Gen Serial Number [id number]    Clinic Name [facility]    Implantable Pulse Generator Type ICM/ILR    Implantable Pulse Generator Implant Date [id number]        Pertinent labs & imaging results that were available during my care of the patient were reviewed by me and considered in my medical decision making.  Assessment & Plan:  Brandilyn was seen today for insomnia.  Diagnoses and all orders for this visit:  Chronic midline low back pain with bilateral sciatica -     HYDROcodone -acetaminophen  (NORCO) 7.5-325 MG tablet; Take 1 tablet by mouth every 6 (six) hours as needed for moderate pain (pain score 4-6). -     HYDROcodone -acetaminophen   (NORCO) 7.5-325 MG tablet; Take 1 tablet by mouth every 6 (six) hours as needed for moderate pain (pain score 4-6). -     HYDROcodone -acetaminophen  (NORCO) 7.5-325 MG tablet; Take 1 tablet by mouth every 6 (six) hours as needed for moderate pain (pain score 4-6). -     methylPREDNISolone  acetate (DEPO-MEDROL ) injection 80 mg  Chronic hip pain, bilateral -     HYDROcodone -acetaminophen  (NORCO) 7.5-325 MG tablet; Take 1 tablet by mouth every 6 (six) hours as needed for moderate pain (pain score 4-6). -     HYDROcodone -acetaminophen  (NORCO) 7.5-325 MG tablet; Take 1 tablet by mouth every 6 (six) hours  as needed for moderate pain (pain score 4-6). -     HYDROcodone -acetaminophen  (NORCO) 7.5-325 MG tablet; Take 1 tablet by mouth every 6 (six) hours as needed for moderate pain (pain score 4-6).  Controlled substance agreement signed -     HYDROcodone -acetaminophen  (NORCO) 7.5-325 MG tablet; Take 1 tablet by mouth every 6 (six) hours as needed for moderate pain (pain score 4-6). -     HYDROcodone -acetaminophen  (NORCO) 7.5-325 MG tablet; Take 1 tablet by mouth every 6 (six) hours as needed for moderate pain (pain score 4-6). -     HYDROcodone -acetaminophen  (NORCO) 7.5-325 MG tablet; Take 1 tablet by mouth every 6 (six) hours as needed for moderate pain (pain score 4-6).  Primary insomnia -     Suvorexant  (BELSOMRA ) 20 MG TABS; Take 1 tablet (20 mg total) by mouth at bedtime as needed.       Primary insomnia Insomnia persists despite current belsomra  15 mg regimen, with occasional effectiveness. Trazodone  is contraindicated due to Tikosyn  use. No significant caffeine intake or stimulating activities before bedtime. Occasional racing thoughts noted. 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  Previous steroid injections provided relief but are not frequently used due to potential bone softening and diabetes impact. No recent falls or injuries. No signs of cauda equina syndrome. MRI from 2021 shows significant disc disease. - Administered steroid injection for pain relief - Monitor blood sugar levels due to potential steroid-induced hyperglycemia - Switch from B12 to Nervive supplement for nerve support - Consider referral to spinal specialist if pain persists or worsens  Chronic bilateral hip pain Chronic bilateral hip pain, worse on the right side, exacerbated by coughing and movement. Pain management with hydrocodone  has increased to twice daily due to worsening symptoms. Previous orthopedic interventions included steroid injections, which were effective but limited due to potential side effects. - Refilled hydrocodone  prescription - Monitor pain levels and consider referral to pain management specialist if dosage needs adjustment  Type 2 diabetes mellitus, controlled Type 2 diabetes mellitus is well-controlled with no recent issues of hyperglycemia. Last A1c was in November, and current blood sugar levels are stable. Steroid injections may affect blood sugar levels. - Extended diabetic check to 8 weeks to allow time for belsomra  dose adjustment - Monitor blood sugar levels, especially after steroid injection          Continue all other maintenance medications.  Follow up plan: Return in about 8 weeks (around 05/23/2024) for DM, insomnia.   Continue healthy lifestyle choices, including diet (rich in fruits, vegetables, and lean proteins, and low in salt and simple carbohydrates) and exercise (at least 30 minutes of moderate physical activity daily).  Educational handout given for Korene   The above assessment and management plan was discussed with the patient. The patient verbalized understanding of and has agreed to the management plan. Patient is aware to call the clinic  if they develop any new symptoms or if symptoms persist or worsen. Patient is aware when to return to the clinic for a follow-up visit. Patient educated on when it is appropriate to go to the emergency department.   Rosaline Bruns, FNP-C [facility] [phone]     [1] No Known Allergies  "

## 2024-03-28 NOTE — Patient Instructions (Signed)
 [name]

## 2024-04-01 ENCOUNTER — Encounter

## 2024-04-03 ENCOUNTER — Ambulatory Visit

## 2024-04-03 ENCOUNTER — Encounter

## 2024-04-14 ENCOUNTER — Encounter

## 2024-05-04 ENCOUNTER — Ambulatory Visit

## 2024-05-20 ENCOUNTER — Ambulatory Visit: Admitting: Family Medicine

## 2024-05-26 IMAGING — US US THYROID
1 series · 13 of 25 positions shown · non-contrast
Comparison: Prior thyroid ultrasound 04/29/2020

CLINICAL DATA: Goiter. Multifocal thyroid ultrasound. Patient
previously underwent biopsy of right superior and right mid thyroid
nodules in Monday April, 2020

EXAM:
THYROID ULTRASOUND
TECHNIQUE: Ultrasound examination of the thyroid gland and adjacent soft
tissues was performed.

[Series 1: us thyroid · 13 of 69 slices shown]
[im 1/69]
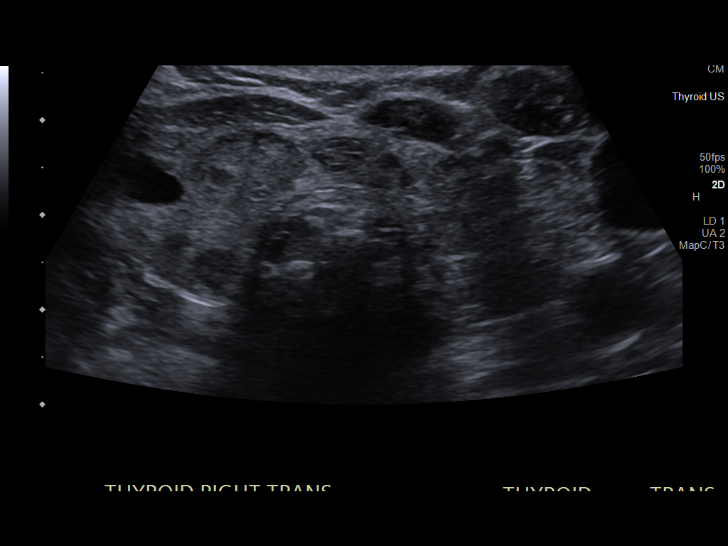
[im 6/69]
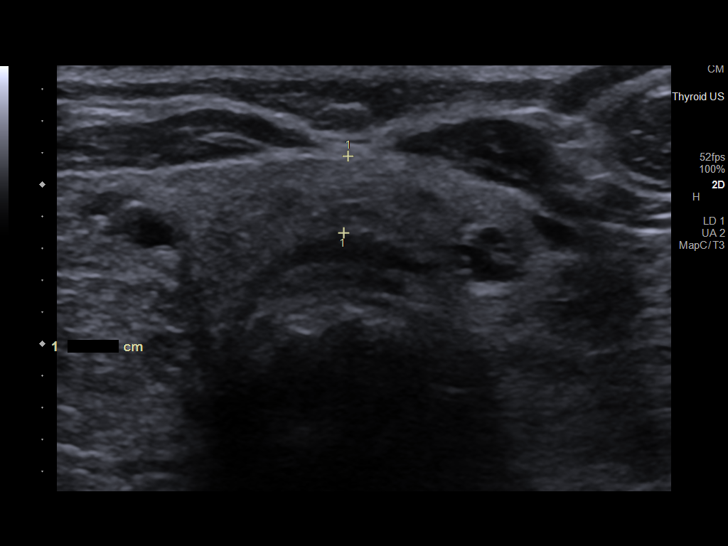
[im 12/69]
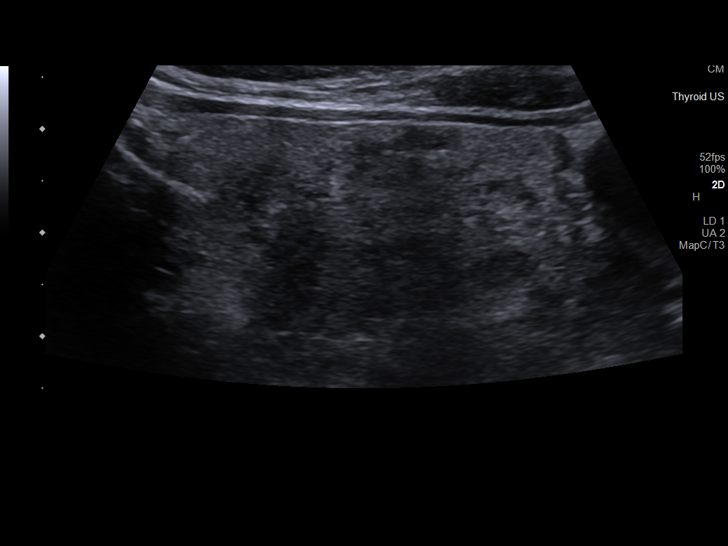
[im 18/69]
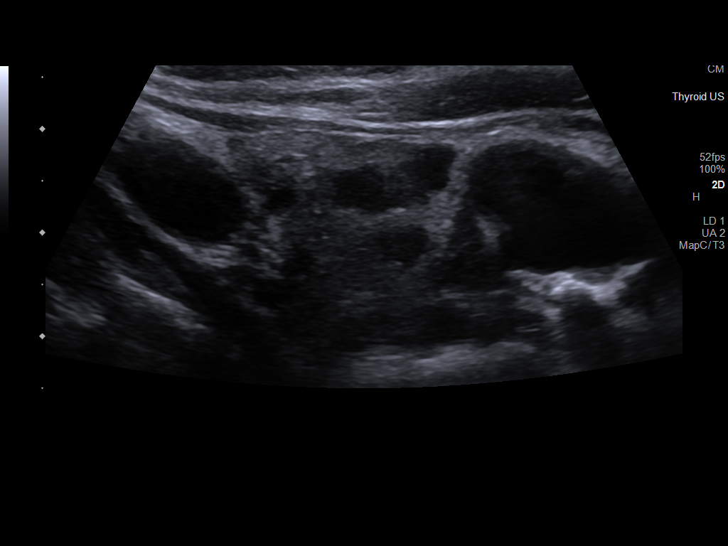
[im 23/69]
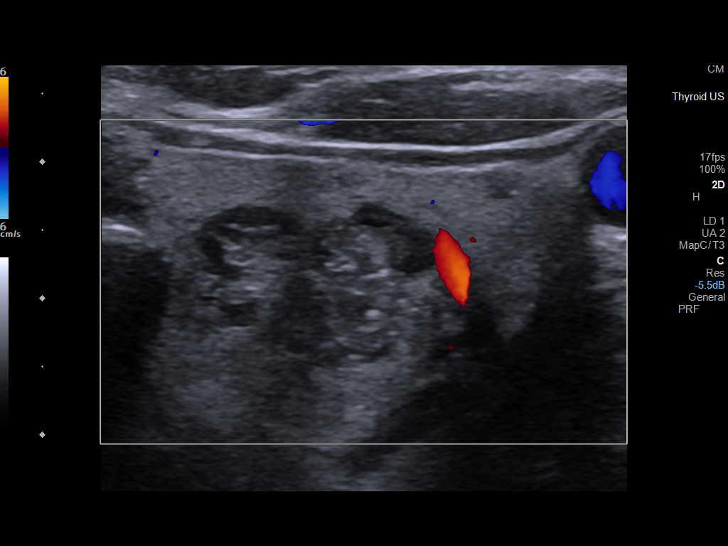
[im 29/69]
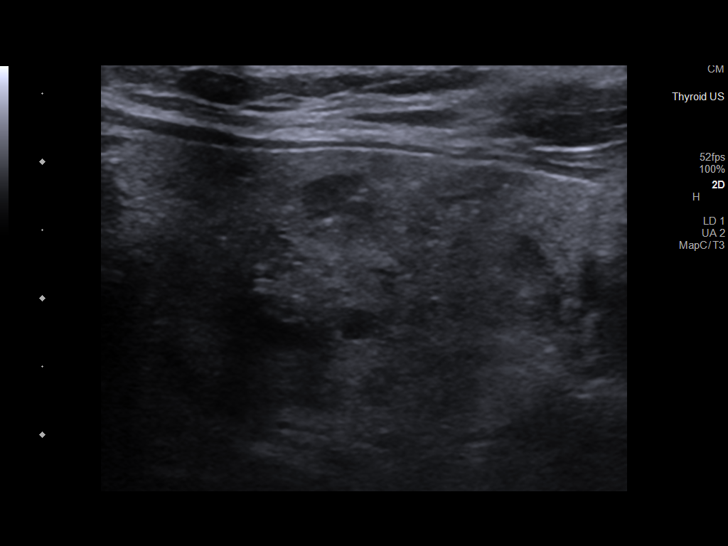
[im 35/69]
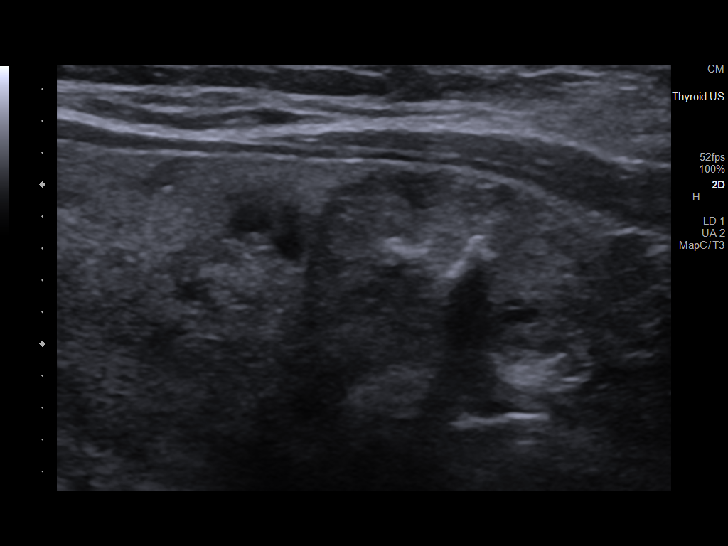
[im 40/69]
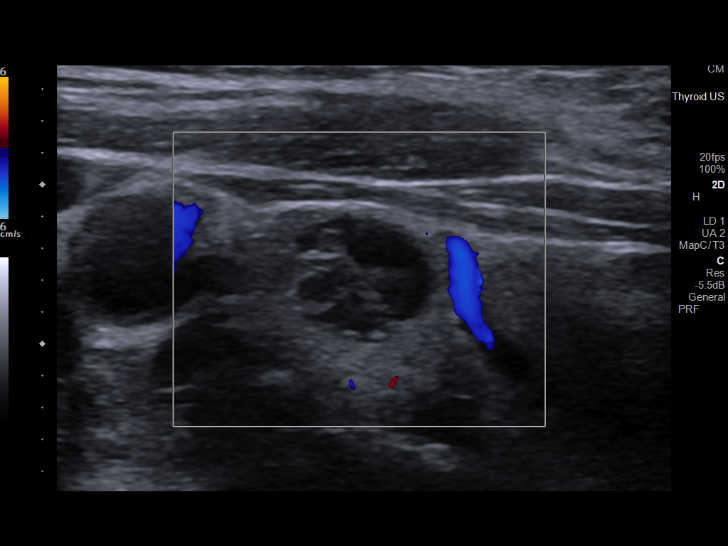
[im 46/69]
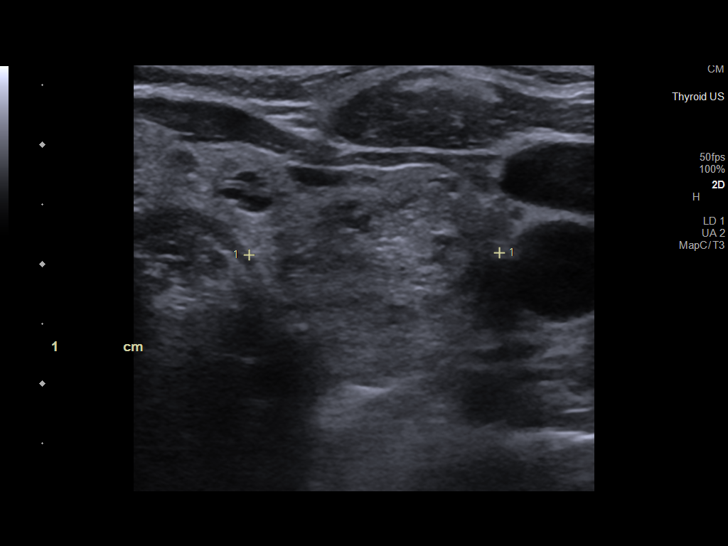
[im 52/69]
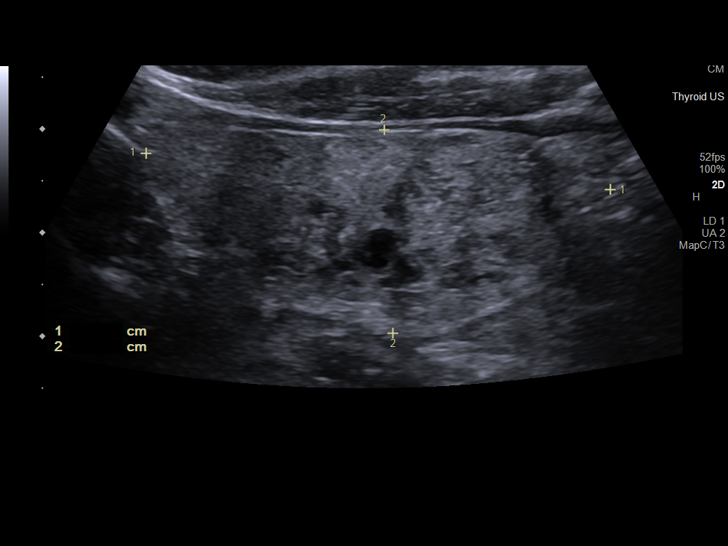
[im 57/69]
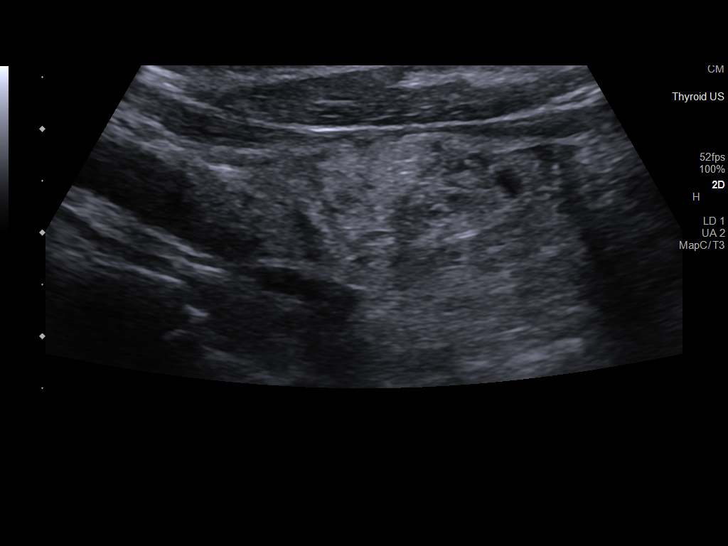
[im 63/69]
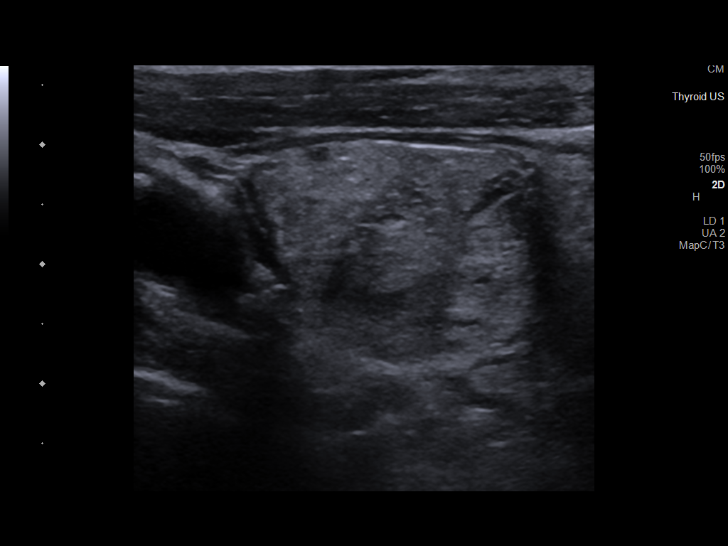
[im 69/69]
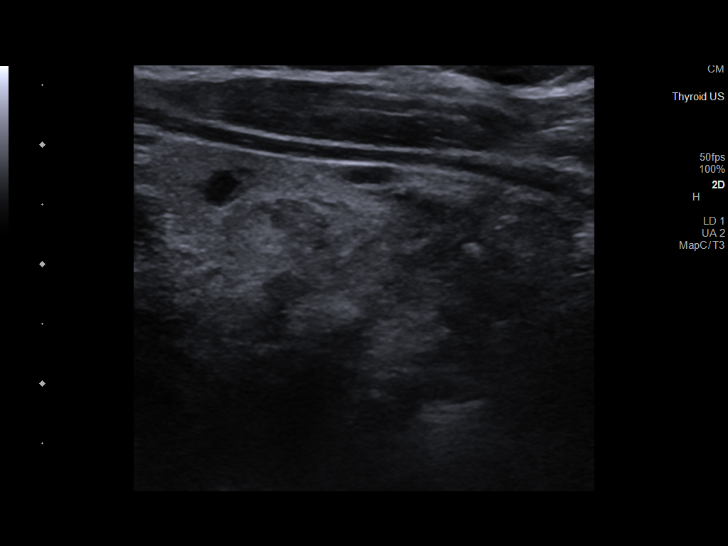

[13 of 25 positions shown; findings below may reference images not displayed]

FINDINGS: Parenchymal Echotexture: Markedly heterogenous

Isthmus: 0.5 cm

Right lobe: 4.5 x 2.1 x 2.4 cm

Left lobe: 4.5 x 2.0 x 2.1 cm

_________________________________________________________

Estimated total number of nodules >/= 1 cm: 5

Number of spongiform nodules >/=  2 cm not described below (TR1): 0

Number of mixed cystic and solid nodules >/= 1.5 cm not described
below (TR2): 0

_________________________________________________________

The thyroid gland is diffusely heterogeneous, enlarged and nodular.
Many of the areas of "nodularity "are inseparable from the
background thyroid parenchyma and most likely to represent areas of
pseudo nodularity.

Nodule # 1: The previously biopsied nodule/cluster of nodules in the
right superior gland demonstrates no significant interval change
compared to prior imaging obtained at the time of biopsy.

Nodule # 3: The previously biopsied nodule with central dystrophic
calcifications in the right mid to lower gland measures 1.9 x 1.7 x
1.2 cm; slightly smaller than 2.3 by 1.7 x 2.2 cm previously.

Additional measured areas either represent benign-appearing
spongiform nodules, or vague regions of pseudo nodularity. No other
discrete nodules identified that would warrant further evaluation.
IMPRESSION: Overall, unchanged appearance of the thyroid gland which is
diffusely enlarged, heterogeneous and lobular with multiple regions
of pseudonodularity.

The previously biopsied nodules in the right upper and right mid
gland demonstrate no significant interval change.

No additional foci of definitive nodularity that would warrant
biopsy or dedicated imaging surveillance.

## 2024-06-04 ENCOUNTER — Ambulatory Visit

## 2024-06-05 ENCOUNTER — Ambulatory Visit (HOSPITAL_COMMUNITY): Admitting: Physician Assistant

## 2024-07-05 ENCOUNTER — Ambulatory Visit

## 2024-08-05 ENCOUNTER — Ambulatory Visit

## 2024-08-11 ENCOUNTER — Ambulatory Visit: Admitting: Nurse Practitioner

## 2024-09-05 ENCOUNTER — Ambulatory Visit

## 2024-10-06 ENCOUNTER — Ambulatory Visit

## 2024-11-06 ENCOUNTER — Ambulatory Visit

## 2024-12-07 ENCOUNTER — Ambulatory Visit

## 2025-01-07 ENCOUNTER — Ambulatory Visit

## 2025-02-07 ENCOUNTER — Ambulatory Visit

## 2025-03-06 ENCOUNTER — Ambulatory Visit

## 2025-03-10 ENCOUNTER — Ambulatory Visit
